# Patient Record
Sex: Female | Born: 1940 | Race: White | Hispanic: No | Marital: Married | Smoking: Former smoker
Health system: Southern US, Community
[De-identification: ages and names within clinical notes are randomized; demographics above are authoritative.]

## PROBLEM LIST (undated history)

## (undated) DIAGNOSIS — I10 Essential (primary) hypertension: Secondary | ICD-10-CM

## (undated) DIAGNOSIS — E785 Hyperlipidemia, unspecified: Secondary | ICD-10-CM

## (undated) DIAGNOSIS — R002 Palpitations: Secondary | ICD-10-CM

## (undated) DIAGNOSIS — E119 Type 2 diabetes mellitus without complications: Secondary | ICD-10-CM

## (undated) DIAGNOSIS — E669 Obesity, unspecified: Secondary | ICD-10-CM

## (undated) DIAGNOSIS — C3490 Malignant neoplasm of unspecified part of unspecified bronchus or lung: Secondary | ICD-10-CM

## (undated) DIAGNOSIS — E039 Hypothyroidism, unspecified: Secondary | ICD-10-CM

## (undated) DIAGNOSIS — C569 Malignant neoplasm of unspecified ovary: Secondary | ICD-10-CM

## (undated) DIAGNOSIS — G4730 Sleep apnea, unspecified: Secondary | ICD-10-CM

## (undated) DIAGNOSIS — F419 Anxiety disorder, unspecified: Secondary | ICD-10-CM

## (undated) HISTORY — DX: Hyperlipidemia, unspecified: E78.5

## (undated) HISTORY — DX: Sleep apnea, unspecified: G47.30

## (undated) HISTORY — DX: Hypothyroidism, unspecified: E03.9

## (undated) HISTORY — DX: Type 2 diabetes mellitus without complications: E11.9

## (undated) HISTORY — DX: Anxiety disorder, unspecified: F41.9

## (undated) HISTORY — DX: Obesity, unspecified: E66.9

## (undated) HISTORY — DX: Malignant neoplasm of unspecified part of unspecified bronchus or lung: C34.90

## (undated) HISTORY — DX: Palpitations: R00.2

## (undated) HISTORY — PX: TONSILLECTOMY: SUR1361

## (undated) HISTORY — DX: Malignant neoplasm of unspecified ovary: C56.9

## (undated) HISTORY — DX: Essential (primary) hypertension: I10

---

## 1998-03-14 DIAGNOSIS — C3490 Malignant neoplasm of unspecified part of unspecified bronchus or lung: Secondary | ICD-10-CM

## 1998-03-14 HISTORY — PX: LOBECTOMY: SHX5089

## 1998-03-14 HISTORY — DX: Malignant neoplasm of unspecified part of unspecified bronchus or lung: C34.90

## 1998-10-05 ENCOUNTER — Encounter: Payer: Self-pay | Admitting: Thoracic Surgery

## 1998-10-06 ENCOUNTER — Inpatient Hospital Stay (HOSPITAL_COMMUNITY): Admission: RE | Admit: 1998-10-06 | Discharge: 1998-10-10 | Payer: Self-pay | Admitting: Thoracic Surgery

## 1998-10-06 ENCOUNTER — Encounter: Payer: Self-pay | Admitting: Thoracic Surgery

## 1998-10-07 ENCOUNTER — Encounter: Payer: Self-pay | Admitting: Thoracic Surgery

## 1998-10-08 ENCOUNTER — Encounter: Payer: Self-pay | Admitting: Thoracic Surgery

## 1998-10-09 ENCOUNTER — Encounter: Payer: Self-pay | Admitting: Thoracic Surgery

## 1999-02-17 ENCOUNTER — Encounter: Payer: Self-pay | Admitting: Thoracic Surgery

## 1999-02-17 ENCOUNTER — Encounter: Admission: RE | Admit: 1999-02-17 | Discharge: 1999-02-17 | Payer: Self-pay | Admitting: Thoracic Surgery

## 1999-03-15 HISTORY — PX: OOPHORECTOMY: SHX86

## 1999-03-15 HISTORY — PX: ABDOMINAL HYSTERECTOMY: SHX81

## 1999-05-11 ENCOUNTER — Inpatient Hospital Stay (HOSPITAL_COMMUNITY): Admission: RE | Admit: 1999-05-11 | Discharge: 1999-05-14 | Payer: Self-pay | Admitting: Obstetrics and Gynecology

## 1999-05-11 ENCOUNTER — Encounter (INDEPENDENT_AMBULATORY_CARE_PROVIDER_SITE_OTHER): Payer: Self-pay

## 1999-08-18 ENCOUNTER — Encounter: Payer: Self-pay | Admitting: Thoracic Surgery

## 1999-08-18 ENCOUNTER — Encounter: Admission: RE | Admit: 1999-08-18 | Discharge: 1999-08-18 | Payer: Self-pay | Admitting: Thoracic Surgery

## 2001-05-25 ENCOUNTER — Ambulatory Visit (HOSPITAL_COMMUNITY): Admission: RE | Admit: 2001-05-25 | Discharge: 2001-05-25 | Payer: Self-pay | Admitting: Gastroenterology

## 2007-08-20 HISTORY — PX: NM MYOCAR PERF WALL MOTION: HXRAD629

## 2009-04-02 ENCOUNTER — Ambulatory Visit (HOSPITAL_COMMUNITY): Admission: RE | Admit: 2009-04-02 | Discharge: 2009-04-02 | Payer: Self-pay | Admitting: Internal Medicine

## 2009-07-28 ENCOUNTER — Other Ambulatory Visit: Admission: RE | Admit: 2009-07-28 | Discharge: 2009-07-28 | Payer: Self-pay | Admitting: Radiology

## 2009-12-30 ENCOUNTER — Ambulatory Visit: Admission: RE | Admit: 2009-12-30 | Discharge: 2009-12-30 | Payer: Self-pay | Admitting: General Surgery

## 2010-05-26 LAB — SURGICAL PCR SCREEN
MRSA, PCR: NEGATIVE
Staphylococcus aureus: NEGATIVE

## 2010-05-26 LAB — DIFFERENTIAL
Basophils Absolute: 0.1 K/uL (ref 0.0–0.1)
Basophils Relative: 0 % (ref 0–1)
Eosinophils Absolute: 0.2 K/uL (ref 0.0–0.7)
Eosinophils Relative: 1 % (ref 0–5)
Lymphocytes Relative: 27 % (ref 12–46)
Lymphs Abs: 3.3 K/uL (ref 0.7–4.0)
Monocytes Absolute: 0.9 K/uL (ref 0.1–1.0)
Monocytes Relative: 7 % (ref 3–12)
Neutro Abs: 7.8 K/uL — ABNORMAL HIGH (ref 1.7–7.7)
Neutrophils Relative %: 64 % (ref 43–77)

## 2010-05-26 LAB — CBC
HCT: 45.9 % (ref 36.0–46.0)
Hemoglobin: 15.3 g/dL — ABNORMAL HIGH (ref 12.0–15.0)
MCH: 31.2 pg (ref 26.0–34.0)
MCHC: 33.3 g/dL (ref 30.0–36.0)
MCV: 93.5 fL (ref 78.0–100.0)
Platelets: 250 K/uL (ref 150–400)
RBC: 4.91 MIL/uL (ref 3.87–5.11)
RDW: 13.4 % (ref 11.5–15.5)
WBC: 12.3 K/uL — ABNORMAL HIGH (ref 4.0–10.5)

## 2010-05-26 LAB — BASIC METABOLIC PANEL
BUN: 8 mg/dL (ref 6–23)
CO2: 30 mEq/L (ref 19–32)
Calcium: 9.2 mg/dL (ref 8.4–10.5)
Chloride: 107 mEq/L (ref 96–112)
Creatinine, Ser: 0.66 mg/dL (ref 0.4–1.2)
GFR calc Af Amer: >60 mL/min (ref >60)
GFR calc non Af Amer: >60 mL/min (ref >60)
Glucose, Bld: 77 mg/dL (ref 70–99)
Potassium: 4.6 mEq/L (ref 3.5–5.1)
Sodium: 142 mEq/L (ref 135–145)

## 2010-07-30 NOTE — Op Note (Signed)
[facility]  Patient:    Erin Myers, Erin Myers Visit Number: [id number] MRN: [id number]          Service Type: Attending:  Petra Kuba, M.D. Dictated by:   Petra Kuba, M.D. Proc. Date: 05/25/01   CC:         Vania Rea. Jarold Motto, M.D. [facility]   Operative Report  PROCEDURE:  Colonoscopy with biopsy.  ENDOSCOPIST:  Petra Kuba, M.D.  INDICATIONS:  Patient with mild GI symptoms.  Due for colonic screening.  INFORMED CONSENT:  Consent was signed after risks, benefits, methods and options were thoroughly discussed in the office.  MEDICATIONS USED:  Demerol 100 mg, Versed 8 mg.  DESCRIPTION OF PROCEDURE:  Rectal inspection was pertinent for external hemorrhoids.  Digital exam was negative.  The video pediatric adjustable colonoscope was inserted and advanced advanced around the colon to the cecum.  This required some abdominal pressure but no position changes.  The cecum was identified by the appendiceal orifice and the ileocecal valve.  In fact, the scope was inserted a short way into the terminal ileum which was normal.  Photodocumentation was obtained.  The scope was slowly withdrawn.  The prep was adequate.  There was some liquid stool that required washing and suctioning.  On slow withdrawal back to the rectum, no abnormalities were seen.  Specifically, no polyps tumors, masses or diverticula.  Once back in the rectum, the scope was retroflexed pertinent for some internal hemorrhoids.  The scope was straightened and readvanced a short way around the left side of the colon.  Air was suctioned.  The scope was removed.  The patient tolerated the procedure well.  There was no obvious immediate complication.  ENDOSCOPIC DIAGNOSES: 1. Internal and external hemorrhoids. 2. Otherwise within normal limits to the terminal ileum.  PLAN: 1. She will follow up p.r.n. 2. Yearly rectal and guaiacs per Dr. Jarold Motto. 3. Repeat screening in 5-10 years. Dictated  by:   Petra Kuba, M.D. Attending:  Petra Kuba, M.D. DD:  05/25/01 TD:  05/26/01 Job: [id number]

## 2011-03-30 ENCOUNTER — Other Ambulatory Visit: Payer: Self-pay

## 2012-08-14 ENCOUNTER — Ambulatory Visit: Payer: Medicare Other | Admitting: Cardiology

## 2012-08-15 ENCOUNTER — Telehealth: Payer: Self-pay | Admitting: Cardiology

## 2012-08-17 ENCOUNTER — Encounter: Payer: Self-pay | Admitting: Cardiology

## 2012-08-17 DIAGNOSIS — G4733 Obstructive sleep apnea (adult) (pediatric): Secondary | ICD-10-CM | POA: Insufficient documentation

## 2012-08-17 DIAGNOSIS — G4730 Sleep apnea, unspecified: Secondary | ICD-10-CM

## 2012-08-17 DIAGNOSIS — E785 Hyperlipidemia, unspecified: Secondary | ICD-10-CM | POA: Insufficient documentation

## 2012-08-17 DIAGNOSIS — E039 Hypothyroidism, unspecified: Secondary | ICD-10-CM

## 2012-08-17 DIAGNOSIS — Z8249 Family history of ischemic heart disease and other diseases of the circulatory system: Secondary | ICD-10-CM

## 2012-08-17 DIAGNOSIS — R002 Palpitations: Secondary | ICD-10-CM | POA: Insufficient documentation

## 2012-08-17 DIAGNOSIS — I10 Essential (primary) hypertension: Secondary | ICD-10-CM

## 2012-08-20 ENCOUNTER — Ambulatory Visit (INDEPENDENT_AMBULATORY_CARE_PROVIDER_SITE_OTHER): Payer: Medicare Other | Admitting: Cardiology

## 2012-08-20 ENCOUNTER — Encounter: Payer: Self-pay | Admitting: Cardiology

## 2012-08-20 VITALS — BP 140/88 | Ht 65.0 in | Wt 216.7 lb

## 2012-08-20 DIAGNOSIS — E669 Obesity, unspecified: Secondary | ICD-10-CM | POA: Insufficient documentation

## 2012-08-20 DIAGNOSIS — G4730 Sleep apnea, unspecified: Secondary | ICD-10-CM

## 2012-08-20 DIAGNOSIS — E785 Hyperlipidemia, unspecified: Secondary | ICD-10-CM

## 2012-08-20 DIAGNOSIS — R002 Palpitations: Secondary | ICD-10-CM

## 2012-08-20 DIAGNOSIS — Z8249 Family history of ischemic heart disease and other diseases of the circulatory system: Secondary | ICD-10-CM

## 2012-08-20 DIAGNOSIS — I10 Essential (primary) hypertension: Secondary | ICD-10-CM

## 2012-08-20 NOTE — Assessment & Plan Note (Signed)
On aspirin, beta blocker and fenofibrate.

## 2012-08-20 NOTE — Assessment & Plan Note (Addendum)
On beta blocker.  Will adjust dosing to 50 mg BID for more AM coverage. These are most likely benign, with no arrhythmia.

## 2012-08-20 NOTE — Assessment & Plan Note (Signed)
No longer using CPAP.  She is using breathe right strips.

## 2012-08-20 NOTE — Assessment & Plan Note (Signed)
On fenofibrate. Need to check recent labs.

## 2012-08-20 NOTE — Assessment & Plan Note (Addendum)
Only on Metoprolol XL. Borderline control.  Stopped ACE-I due to cough, if BP continues to run borderline or hypertensive, would consider ARB.

## 2012-08-20 NOTE — Patient Instructions (Addendum)
You seem to be doing pretty well.  Since your palpitations are worst in the AM -- lets change your Metoprolol to 50mg  (1 tab) 2 times daily to give more overnight & early AM coverage.  Otherwise, I agree with being back on Crestor.  As we discussed, I would like for you to start up an exercise regimen -- start ~15-20 min a day and increase up to 25-30 min a day at least 4 days a week.    If your shortness of breath does not improve, please let us know, so we can further evaluate this.  Marykay Lex, MD

## 2012-08-20 NOTE — Progress Notes (Signed)
Patient ID: Erin Myers, female   DOB: 01-25-1941, 72 y.o.   MRN: [id number]  Clinic Note: HPI: [name] is a 72 y.o. female with a PMH below who presents today for a routine followup of her palpitations.[name] She's been a long-term patient of Dr. Julieanne Manson. She notes her palpitations are seen in the morning with isolated skipped beats. They're not affected by stress, sleep deprivation or caffeine. She does not note them usually throughout the rest of the day. She has not noted a tachycardia. She's been relatively well-controlled on beta blockers. Her exercise regimen was limited last visit by a recent bout of plantar fasciitis -- that still hinders her some. She had been switched to Fenofibrate for lipid control - but was recently converted back to Crestor by her PCP - and is tolerating it with no complaints of myalgias.  Interval History: Since her last visit, she continues to have her AM palpitations that last several minutes -- not sustained.  They usually do not last into the day, and there is no real association with any particular activity or foods.  She does not notice them as much once she is up and about.  These episodes are not associated with dyspnea or chest pain.  She denies any lightheadedness, dizziness or syncopal / near syncopal symptoms associated with the palpitations or not.  She does note some incremental improvement of symptoms since her BB dose was increased.  She denies any PND, orthopnea or edema besides some mild "puffiness" in her legs after a long day on her feet. No complaints of varicose veins or Venous insufficiency symptoms. She denies any melena, hematochezia or hematuria.  She denies any claudication.  Past Medical History  Diagnosis Date  . HTN (hypertension)   . Dyslipidemia   . Hypothyroid   . Palpitations 2009 and 2006    Negative nuclear stress test   . Lung cancer 2000    Carcinoid, s/p RLL lobectomy  . Sleep apnea     on C-pap      show some mild chronic low back pain  Plantar fasciitis of the left diagnosed in summer 2013.  Prior cardiac evaluation and past surgical history: Past Surgical History  Procedure Laterality Date  . Oophorectomy  2001  . Abdominal hysterectomy  2001  . Lobectomy  2000    RLL    Allergies  Allergen Reactions  . Adhesive (Tape)   . Codeine   . Sulfa Antibiotics     Current Outpatient Prescriptions  Medication Sig Dispense Refill  . aspirin 81 MG tablet Take 81 mg by mouth daily.      . clorazepate (TRANXENE) 7.5 MG tablet Take 7.5 mg by mouth 2 (two) times daily as needed for anxiety.      [name] estradiol (ESTRACE) 1 MG tablet Take 1 mg by mouth daily.      [name] levothyroxine (SYNTHROID, LEVOTHROID) 125 MCG tablet Take 125 mcg by mouth daily before breakfast.      . metoprolol succinate (TOPROL-XL) 50 MG 24 hr tablet Take 75 mg by mouth daily. Take 75 mg (1 and1/2 tablets) in morning and 25 mg (1/2 tablet) in evening      . rosuvastatin (CRESTOR) 10 MG tablet Take 10 mg by mouth daily.       No current facility-administered medications for this visit.    History   Social History  . Marital Status: Married    Spouse Name: N/A    Number of Children:  2  . Years of Education: N/A   Occupational History  . Not on file.   Social History Main Topics  . Smoking status: Former Smoker    Quit date: 08/18/1991  . Smokeless tobacco: Not on file  . Alcohol Use: No  . Drug Use: Not on file  . Sexually Active: Not on file   Other Topics Concern  . Not on file   Social History Narrative   Had been unable to exercise due to her plantar fasciitis.    ROS: A comprehensive Review of Systems - Negative except pertinent positives noted above.    PHYSICAL EXAM BP 140/88  Ht 5\' 5"  (1.651 m)  Wt 216 lb 11.2 oz (98.294 kg)  BMI 36.06 kg/m2 General appearance: alert, cooperative, appears stated age, no distress, moderately obese and Normal mood & Affect. Well groomed, healthy  appearing Neck: no adenopathy, no carotid bruit, no JVD, supple, symmetrical, trachea midline and thyroid not enlarged, symmetric, no tenderness/mass/nodules Lungs: clear to auscultation bilaterally, normal percussion bilaterally and non-labored  Heart: regular rate and rhythm, S1, S2 normal, no murmur, click, rub or gallop and normal apical impulse Abdomen: soft, non-tender; bowel sounds normal; no masses,  no organomegaly and moderate truncal obesity Extremities: extremities normal, atraumatic, no cyanosis or edema, no edema, redness or tenderness in the calves or thighs and no ulcers, gangrene or trophic changes Pulses: 2+ and symmetric Neurologic: Grossly normal HEENT: [place]/AT, EOMI, MMM, anicteric sclerae  [id number] today: Yes Rate:68 , Rhythm: NSR, normal ECG  Last Labs: none available  ASSESSMENT: Relatively stable, likely benign palpitations.   Palpitations - Plan: EKG 12-Lead  Dyslipidemia  HTN (hypertension)  Family history of coronary artery disease  Sleep apnea  Obesity (BMI 30-39.9)  PLAN: Per problem list.   Followup: 1 yr  HARDING,DAVID W, M.D., M.S. [facility] [address] [place], [place]  [address]  [phone] Pager # [phone] 08/21/2012 records and 6:29 AM

## 2012-08-20 NOTE — Assessment & Plan Note (Signed)
We discussed dietary modifications & the need to start up an exercise regimen.

## 2012-08-21 ENCOUNTER — Encounter: Payer: Self-pay | Admitting: Cardiology

## 2012-08-23 ENCOUNTER — Encounter: Payer: Self-pay | Admitting: *Deleted

## 2012-08-25 ENCOUNTER — Encounter: Payer: Self-pay | Admitting: Cardiology

## 2013-07-23 NOTE — Telephone Encounter (Signed)
Close encounter 

## 2013-11-20 ENCOUNTER — Ambulatory Visit (INDEPENDENT_AMBULATORY_CARE_PROVIDER_SITE_OTHER): Payer: Medicare Other | Admitting: Cardiology

## 2013-11-20 ENCOUNTER — Encounter: Payer: Self-pay | Admitting: Cardiology

## 2013-11-20 VITALS — BP 142/80 | HR 65 | Ht 65.0 in | Wt 212.4 lb

## 2013-11-20 DIAGNOSIS — E785 Hyperlipidemia, unspecified: Secondary | ICD-10-CM

## 2013-11-20 DIAGNOSIS — I10 Essential (primary) hypertension: Secondary | ICD-10-CM

## 2013-11-20 DIAGNOSIS — Z8249 Family history of ischemic heart disease and other diseases of the circulatory system: Secondary | ICD-10-CM

## 2013-11-20 DIAGNOSIS — E669 Obesity, unspecified: Secondary | ICD-10-CM

## 2013-11-20 DIAGNOSIS — G4733 Obstructive sleep apnea (adult) (pediatric): Secondary | ICD-10-CM

## 2013-11-20 DIAGNOSIS — Z9989 Dependence on other enabling machines and devices: Secondary | ICD-10-CM

## 2013-11-20 DIAGNOSIS — R002 Palpitations: Secondary | ICD-10-CM

## 2013-11-20 NOTE — Patient Instructions (Signed)
NO CHANGES TO CURRENT MEDIATIONS.  Your physician wants you to follow-up in 12 month Dr Ellyn Hack.  You will receive a reminder letter in the mail two months in advance. If you don't receive a letter, please call our office to schedule the follow-up appointment.

## 2013-11-23 ENCOUNTER — Encounter: Payer: Self-pay | Admitting: Cardiology

## 2013-11-23 NOTE — Assessment & Plan Note (Signed)
Stable on current dose of beta blocker. The increased dose to 50 mg of Toprol and taking in the morning has been helpful but it seemed that her thyroid management was more important.

## 2013-11-23 NOTE — Assessment & Plan Note (Signed)
Again borderline control but stable. ARB/HCTZ was added since last visit.

## 2013-11-23 NOTE — Progress Notes (Signed)
PCP: Donnajean Lopes, MD  Clinic Note: Chief Complaint  Patient presents with  . Follow-up    1 year visit. pt denies chest pain and swelling. experiences sob with exertion.    HPI: Erin Myers is a 73 y.o. female with a Cardiovascular Problem List below who presents today for annual followup of palpitations and cardiac risk factors. Her husband Erin Myers is also patient of mine. They were both long-term patient of Dr. Aldona Bar.  Interval History: She is doing relatively well with no major complaints. She does get little short of breath when she tries to either herself a lot but for the most part has been doing pretty well. She was having a little bit of edema in her PCP put her on a low dose of ACTZ combination with her losartan and that seemed to handle it quite well. It also helped her dyspnea. She denies any PND or orthopnea. No exertional chest tightness or pressure. Her palpitations are very fleetingly once twice a week and lasts a few seconds. They seem to be much better ever since her thyroid management has become more stable.  She denies any syncope or near syncope, TIA or amaurosis fugax symptoms.  She and her husband have started on the diet and exercise per gram. She is a little bit upset that his only lost about 4 or 5 pounds and he has lost 10, but continues to be motivated to lose more.  Past Medical History  Diagnosis Date  . HTN (hypertension)   . Dyslipidemia   . Hypothyroid   . Palpitations 2009 and 2006    Negative nuclear stress test   . Lung cancer 2000    Carcinoid, s/p RLL lobectomy  . Sleep apnea     on C-pap   Prior Cardiac Evaluation and Past Surgical History: Past Surgical History  Procedure Laterality Date  . Oophorectomy  2001  . Abdominal hysterectomy  2001  . Lobectomy  2000    RLL  . Nm myocar perf wall motion  08/20/2007    EF 74%  EXERCISE CAPACITY 7 METS   MEDICATIONS AND ALLERGIES REVIEWED IN EPIC No Change in Social and Family  History  ROS: A comprehensive Review of Systems - was performed Review of Systems  Constitutional: Positive for weight loss.       On purpose  HENT: Negative for nosebleeds.   Respiratory: Negative for cough, hemoptysis, sputum production, shortness of breath and wheezing.   Gastrointestinal: Negative for blood in stool and melena.  Genitourinary: Negative for hematuria.  Neurological: Negative for dizziness, sensory change, speech change, focal weakness and loss of consciousness.  Psychiatric/Behavioral: Negative for depression. The patient is not nervous/anxious.   All other systems reviewed and are negative.   Wt Readings from Last 3 Encounters:  11/20/13 212 lb 6.4 oz (96.344 kg)  08/20/12 216 lb 11.2 oz (98.294 kg)   PHYSICAL EXAM BP 142/80  Pulse 65  Ht 5\' 5"  (1.651 m)  Wt 212 lb 6.4 oz (96.344 kg)  BMI 35.35 kg/m2 General appearance: alert, cooperative, appears stated age, no distress, moderately obese and Normal mood & Affect. Well groomed, healthy appearing  Neck: no adenopathy, no carotid bruit, no JVD, supple, symmetrical, trachea midline and thyroid not enlarged, symmetric, no tenderness/mass/nodules  Lungs: clear to auscultation bilaterally, normal percussion bilaterally and non-labored  Heart: regular rate and rhythm, S1, S2 normal, no murmur, click, rub or gallop and normal apical impulse  Abdomen: soft, non-tender; bowel sounds normal; no  masses, no organomegaly and moderate truncal obesity  Extremities: extremities normal, atraumatic, no cyanosis or edema, no edema, redness or tenderness in the calves or thighs and no ulcers, gangrene or trophic changes  Pulses: 2+ and symmetric  Neurologic: Grossly normal  HEENT: [place]/AT, EOMI, MMM, anicteric sclerae   Adult ECG Report  Rate: 65 ;  Rhythm: normal sinus rhythm, borderline LAE but otherwise normal EKG  Narrative Interpretation: Stable Recent Labs not available:   ASSESSMENT / PLAN: Palpitations Stable on  current dose of beta blocker. The increased dose to 50 mg of Toprol and taking in the morning has been helpful but it seemed that her thyroid management was more important.  Essential hypertension Again borderline control but stable. ARB/HCTZ was added since last visit.  Dyslipidemia, goal LDL below 130 Now on Crestor as opposed to fenofibrate. Monitored by PCP  Obesity (BMI 30-39.9) She is actively now working on diet and exercise. A bit frustrated with lack of significant change, but is happy that she is at least losing some. Congratulated her on her efforts and encouraged her to continue.  OSA on CPAP She is off and on using CPAP versus Breathe Right strips    Orders Placed This Encounter  Procedures  . EKG 12-Lead   new meds entered Meds ordered this encounter  Medications  . losartan-hydrochlorothiazide (HYZAAR) 50-12.5 MG per tablet    Sig:     Followup: 12 months    Aubree Doody W, M.D., M.S. Interventional Cardiologist   Pager # [phone]

## 2013-11-23 NOTE — Assessment & Plan Note (Signed)
She is off and on using CPAP versus Breathe Right strips

## 2013-11-23 NOTE — Assessment & Plan Note (Signed)
Now on Crestor as opposed to fenofibrate. Monitored by PCP

## 2013-11-23 NOTE — Assessment & Plan Note (Signed)
She is actively now working on diet and exercise. A bit frustrated with lack of significant change, but is happy that she is at least losing some. Congratulated her on her efforts and encouraged her to continue.

## 2014-10-23 ENCOUNTER — Ambulatory Visit (INDEPENDENT_AMBULATORY_CARE_PROVIDER_SITE_OTHER): Payer: PPO | Admitting: Neurology

## 2014-10-23 ENCOUNTER — Encounter: Payer: Self-pay | Admitting: Neurology

## 2014-10-23 VITALS — BP 136/78 | HR 68 | Resp 16 | Ht 65.0 in | Wt 209.0 lb

## 2014-10-23 DIAGNOSIS — G4733 Obstructive sleep apnea (adult) (pediatric): Secondary | ICD-10-CM

## 2014-10-23 DIAGNOSIS — E669 Obesity, unspecified: Secondary | ICD-10-CM | POA: Diagnosis not present

## 2014-10-23 NOTE — Progress Notes (Signed)
Subjective:    Patient ID: Erin Myers is a 74 y.o. female.  HPI     Star Age, MD, PhD [facility] [address], [address] [place], [place] [address]  Dear Dr. Philip Aspen,  I saw your patient, Erin Myers, upon your kind request in my neurologic clinic today for initial consultation of her sleep disorder, in particular, concern for underlying obstructive sleep apnea and particularly reevaluation thereof. The patient is unaccompanied today. As you know, [name] is a 74 year old right-handed woman with an underlying medical history of palpitations, hypertension, dyslipidemia, hypothyroidism, carcinoid lung cancer, status post right lower lobe lobectomy, who was previously diagnosed with obstructive sleep apnea several years ago and placed on CPAP therapy. Prior sleep test results are not available for my review today. I reviewed your office note from 07/24/2014, which you kindly included. This mentions a prior diagnosis of severe obstructive sleep apnea and CPAP therapy at a pressure of 7 cm. She no longer is on treatment, as she had a difficulty time with it. She reports no family history of OSA. She would be willing to try CPAP again if the need arises. She drinks caffeine in the form of iced tea, 1-2 glasses per day, she does not drink sodas or coffee. She rarely drinks alcohol maybe once or twice per year. She quit smoking in 1995. She works with her husband who has an Multimedia programmer. She is a retired Catering manager. Bedtime is around 11 PM and rise time is around 7 AM. She does not set an alarm. She wakes up sometimes adequately rested and sometimes marginally rested. She denies any headaches. Over the course of time she has gained weight. She has no significant nocturia. She feels tired during the day and may follow sleep if sedentary. She watches TV in bed at night. She denies restless leg symptoms. Her husband noticed her snoring and  years ago she had an episode of gasping for air. She is not known to twitch her legs and her sleep. Her Epworth sleepiness score is 9 out of 24 today, her fatigue score is 36 out of 63.  Her Past Medical History Is Significant For: Past Medical History  Diagnosis Date  . HTN (hypertension)   . Dyslipidemia   . Hypothyroid   . Palpitations 2009 and 2006    Negative nuclear stress test   . Sleep apnea     on C-pap  . Obesity   . Diabetes mellitus without complication   . Hypothyroidism   . Hyperlipemia   . Anxiety   . Lung cancer 2000    Carcinoid, s/p RLL lobectomy  . Ovarian cancer     Her Past Surgical History Is Significant For: Past Surgical History  Procedure Laterality Date  . Oophorectomy  2001  . Abdominal hysterectomy  2001  . Lobectomy  2000    RLL  . Nm myocar perf wall motion  08/20/2007    EF 74%  EXERCISE CAPACITY 7 METS    Her Family History Is Significant For: Family History  Problem Relation Age of Onset  . Coronary artery disease Mother 33    died of an MI  . Heart attack Mother   . CVA Brother 12  . CVA Father   . Suicidality Sister     Her Social History Is Significant For: Social History   Social History  . Marital Status: Married    Spouse Name: N/A  . Number of Children: 2  .  Years of Education: HS   Occupational History  . Insurance     Social History Main Topics  . Smoking status: Former Smoker    Quit date: 08/18/1991  . Smokeless tobacco: None  . Alcohol Use: No  . Drug Use: No  . Sexual Activity: Not Asked   Other Topics Concern  . None   Social History Narrative   1-2 glasses of tea a day     Her Allergies Are:  Allergies  Allergen Reactions  . Adhesive [Tape]   . Codeine   . Lactose Intolerance (Gi) Diarrhea  . Sulfa Antibiotics   :   Her Current Medications Are:  Outpatient Encounter Prescriptions as of 10/23/2014  Medication Sig  . aspirin 81 MG tablet Take 81 mg by mouth daily.  . canagliflozin  (INVOKANA) 300 MG TABS tablet Take 300 mg by mouth daily before breakfast.  . clorazepate (TRANXENE) 7.5 MG tablet Take 7.5 mg by mouth 2 (two) times daily as needed for anxiety.  [name] levothyroxine (SYNTHROID, LEVOTHROID) 112 MCG tablet Take 112 mcg by mouth daily before breakfast.  . losartan-hydrochlorothiazide (HYZAAR) 50-12.5 MG per tablet   . metoprolol succinate (TOPROL-XL) 50 MG 24 hr tablet Take 75 mg by mouth daily. Take 75 mg (1 and1/2 tablets) in morning and 25 mg (1/2 tablet) in evening  . rosuvastatin (CRESTOR) 10 MG tablet Take 10 mg by mouth daily.  . [DISCONTINUED] cyclobenzaprine (FLEXERIL) 10 MG tablet Take 10 mg by mouth 3 (three) times daily as needed for muscle spasms.  . [DISCONTINUED] ipratropium-albuterol (DUONEB) 0.5-2.5 (3) MG/3ML SOLN Take 3 mLs by nebulization.   No facility-administered encounter medications on file as of 10/23/2014.  :  Review of Systems:  Out of a complete 14 point review of systems, all are reviewed and negative with the exception of these symptoms as listed below:   Review of Systems  Constitutional: Positive for fatigue.  HENT: Positive for tinnitus.   Respiratory: Positive for cough and wheezing.        Snoring   Cardiovascular: Positive for leg swelling.  Endocrine:       Flushing   Neurological:       Sleep study around 2000, H/O CPAP use, Snoring, no trouble falling asleep or staying asleep, witnessed apnea, falls asleep while sitting still, wakes up in the morning feeling tired, no headaches, denies taking naps.   Psychiatric/Behavioral:       Too much sleep     Objective:  Neurologic Exam  Physical Exam Physical Examination:   Filed Vitals:   10/23/14 1429  BP: 136/78  Pulse: 68  Resp: 16    General Examination: The patient is a very pleasant 75 y.o. female in no acute distress. She appears well-developed and well-nourished and well groomed.   HEENT: Normocephalic, atraumatic, pupils are equal, round and reactive to  light and accommodation. Funduscopic exam is normal with sharp disc margins noted. Extraocular tracking is good without limitation to gaze excursion or nystagmus noted. Normal smooth pursuit is noted. Hearing is grossly intact. Tympanic membranes are clear bilaterally. Face is symmetric with normal facial animation and normal facial sensation. Speech is clear with no dysarthria noted. There is no hypophonia. There is no lip, neck/head, jaw or voice tremor. Neck is supple with full range of passive and active motion. There are no carotid bruits on auscultation. Oropharynx exam reveals: mild mouth dryness, adequate dental hygiene and moderate airway crowding, due to narrow airway entry and redundant soft palate. Tonsils are  absent. Mallampati is class III. Tongue protrudes centrally and palate elevates symmetrically. Neck size is 15.5 inches. She has a Mild overbite. Nasal inspection reveals no significant nasal mucosal bogginess or redness and no septal deviation.   Chest: Clear to auscultation without wheezing, rhonchi or crackles noted.  Heart: S1+S2+0, regular and normal without murmurs, rubs or gallops noted.   Abdomen: Soft, non-tender and non-distended with normal bowel sounds appreciated on auscultation.  Extremities: There is no pitting edema in the distal lower extremities bilaterally. Pedal pulses are intact.  Skin: Warm and dry without trophic changes noted. There are no varicose veins.  Musculoskeletal: exam reveals no obvious joint deformities, tenderness or joint swelling or erythema.   Neurologically:  Mental status: The patient is awake, alert and oriented in all 4 spheres. Her immediate and remote memory, attention, language skills and fund of knowledge are appropriate. There is no evidence of aphasia, agnosia, apraxia or anomia. Speech is clear with normal prosody and enunciation. Thought process is linear. Mood is normal and affect is normal.  Cranial nerves II - XII are as  described above under HEENT exam. In addition: shoulder shrug is normal with equal shoulder height noted. Motor exam: Normal bulk, strength and tone is noted. There is no drift, tremor or rebound. Romberg is negative. Reflexes are 2+ throughout. Babinski: Toes are flexor bilaterally. Fine motor skills and coordination: intact with normal finger taps, normal hand movements, normal rapid alternating patting, normal foot taps and normal foot agility.  Cerebellar testing: No dysmetria or intention tremor on finger to nose testing. Heel to shin is difficult for her bilaterally. There is no truncal or gait ataxia.  Sensory exam: intact to light touch, pinprick, vibration, temperature sense in the upper and lower extremities.  Gait, station and balance: She stands easily. No veering to one side is noted. No leaning to one side is noted. Posture is age-appropriate and stance is narrow based. Gait shows normal stride length and normal pace. No problems turning are noted. She turns en bloc. Tandem walk is slightly difficult initially.  Assessment and Plan:   In summary, Tamyrah Burbage is a very pleasant 74 y.o.-year old female with an underlying medical history of palpitations, hypertension, dyslipidemia, hypothyroidism, carcinoid lung cancer, status post right lower lobe lobectomy, who was previously diagnosed with obstructive sleep apnea several years ago and placed on CPAP therapy. She presents for re-evaluation. Her history and physical exam are in keeping with obstructive sleep apnea (OSA). I had a long chat with the patient about my findings and the diagnosis of OSA, its prognosis and treatment options. We talked about medical treatments, surgical interventions and non-pharmacological approaches. I explained in particular the risks and ramifications of untreated moderate to severe OSA, especially with respect to developing cardiovascular disease down the Road, including congestive heart failure, difficult  to treat hypertension, cardiac arrhythmias, or stroke. Even type 2 diabetes has, in part, been linked to untreated OSA. Symptoms of untreated OSA include daytime sleepiness, memory problems, mood irritability and mood disorder such as depression and anxiety, lack of energy, as well as recurrent headaches, especially morning headaches. We talked about trying to maintain a healthy lifestyle in general, as well as the importance of weight control. I encouraged the patient to eat healthy, exercise daily and keep well hydrated, to keep a scheduled bedtime and wake time routine, to not skip any meals and eat healthy snacks in between meals. I advised the patient not to drive when feeling sleepy. I recommended  the following at this time: sleep study with potential positive airway pressure titration. (We will score hypopneas at 4% and split the sleep study into diagnostic and treatment portion, if the estimated. 2 hour AHI is >15/h).   I explained the sleep test procedure to the patient and also outlined possible surgical and non-surgical treatment options of OSA, including the use of a custom-made dental device (which would require a referral to a specialist dentist or oral surgeon), upper airway surgical options, such as pillar implants, radiofrequency surgery, tongue base surgery, and UPPP (which would involve a referral to an ENT surgeon). Rarely, jaw surgery such as mandibular advancement may be considered.  I also explained the CPAP treatment option to the patient, who indicated that she would be willing to try CPAP again if the need arises. I explained the importance of being compliant with PAP treatment, not only for insurance purposes but primarily to improve Her symptoms, and for the patient's long term health benefit, including to reduce Her cardiovascular risks. I answered all her questions today and the patient was in agreement. I would like to see her back after the sleep study is completed and encouraged  her to call with any interim questions, concerns, problems or updates.   Thank you very much for allowing me to participate in the care of this nice patient. If I can be of any further assistance to you please do not hesitate to call me at [phone].  Sincerely,   Star Age, MD, PhD

## 2014-10-23 NOTE — Patient Instructions (Signed)
Based on your symptoms and your exam I believe you are still at risk for obstructive sleep apnea or OSA, and I think we should proceed with a sleep study to determine whether you do or do not have OSA and how severe it is. If you have more than mild OSA, I want you to consider treatment with CPAP. Please remember, the risks and ramifications of moderate to severe obstructive sleep apnea or OSA are: Cardiovascular disease, including congestive heart failure, stroke, difficult to control hypertension, arrhythmias, and even type 2 diabetes has been linked to untreated OSA. Sleep apnea causes disruption of sleep and sleep deprivation in most cases, which, in turn, can cause recurrent headaches, problems with memory, mood, concentration, focus, and vigilance. Most people with untreated sleep apnea report excessive daytime sleepiness, which can affect their ability to drive. Please do not drive if you feel sleepy.   I will likely see you back after your sleep study to go over the test results and where to go from there. We will call you after your sleep study to advise about the results (most likely, you will hear from Diana, my nurse) and to set up an appointment at the time, as necessary.    Our sleep lab administrative assistant, Dawn will meet with you or call you to schedule your sleep study. If you don't hear back from her by next week please feel free to call her at [phone]. This is her direct line and please leave a message with your phone number to call back if you get the voicemail box. She will call back as soon as possible.   

## 2014-12-15 ENCOUNTER — Ambulatory Visit (INDEPENDENT_AMBULATORY_CARE_PROVIDER_SITE_OTHER): Payer: PPO | Admitting: Neurology

## 2014-12-15 DIAGNOSIS — G4733 Obstructive sleep apnea (adult) (pediatric): Secondary | ICD-10-CM

## 2014-12-15 DIAGNOSIS — G4734 Idiopathic sleep related nonobstructive alveolar hypoventilation: Secondary | ICD-10-CM

## 2014-12-15 DIAGNOSIS — G4720 Circadian rhythm sleep disorder, unspecified type: Secondary | ICD-10-CM

## 2014-12-15 NOTE — Sleep Study (Signed)
Please see the scanned sleep study interpretation located in the procedure tab in the chart view section.  

## 2014-12-19 ENCOUNTER — Telehealth: Payer: Self-pay | Admitting: Neurology

## 2014-12-19 DIAGNOSIS — G4734 Idiopathic sleep related nonobstructive alveolar hypoventilation: Secondary | ICD-10-CM

## 2014-12-19 DIAGNOSIS — G4733 Obstructive sleep apnea (adult) (pediatric): Secondary | ICD-10-CM

## 2014-12-19 NOTE — Telephone Encounter (Signed)
Patient referred by Dr. Philip Aspen, seen by me on 10/23/14, diagnostic PSG on 12/15/14, ins: Medicare ([employer]).   Please call and notify the patient that the recent sleep study did confirm the diagnosis of moderate obstructive sleep apnea with significant desaturations in REM sleep, and that I recommend treatment for this in the form of CPAP. This will require a repeat sleep study for proper titration and mask fitting. Please explain to patient and arrange for a CPAP titration study. I have placed an order in the chart. Thanks, and please route to [facility] for scheduling next sleep study.  Star Age, MD, PhD [facility])

## 2014-12-22 ENCOUNTER — Telehealth: Payer: Self-pay

## 2014-12-22 NOTE — Telephone Encounter (Signed)
I spoke to patient and she is aware of results. She would like to proceed with titration study. I will fax report to PCP.

## 2014-12-22 NOTE — Telephone Encounter (Signed)
Patient referred by Dr. Philip Aspen, seen by me on 10/23/14, diagnostic PSG on 12/15/14, ins: Medicare ([employer]).  Please call and notify the patient that the recent sleep study did confirm the diagnosis of moderate obstructive sleep apnea with significant desaturations in REM sleep, and that I recommend treatment for this in the form of CPAP. This will require a repeat sleep study for proper titration and mask fitting. Please explain to patient and arrange for a CPAP titration study. I have placed an order in the chart. Thanks, and please route to [facility] for scheduling next sleep study.

## 2014-12-22 NOTE — Telephone Encounter (Signed)
Duplicate phone note, please see other.

## 2015-01-18 ENCOUNTER — Ambulatory Visit (INDEPENDENT_AMBULATORY_CARE_PROVIDER_SITE_OTHER): Payer: PPO | Admitting: Neurology

## 2015-01-18 DIAGNOSIS — G4720 Circadian rhythm sleep disorder, unspecified type: Secondary | ICD-10-CM

## 2015-01-18 DIAGNOSIS — G4733 Obstructive sleep apnea (adult) (pediatric): Secondary | ICD-10-CM | POA: Diagnosis not present

## 2015-01-18 DIAGNOSIS — G479 Sleep disorder, unspecified: Secondary | ICD-10-CM

## 2015-01-18 DIAGNOSIS — G4734 Idiopathic sleep related nonobstructive alveolar hypoventilation: Secondary | ICD-10-CM

## 2015-01-19 NOTE — Sleep Study (Signed)
Please see the scanned sleep study interpretation located in the Procedure tab within the Chart Review section. 

## 2015-01-22 ENCOUNTER — Telehealth: Payer: Self-pay | Admitting: Neurology

## 2015-01-22 DIAGNOSIS — G4733 Obstructive sleep apnea (adult) (pediatric): Secondary | ICD-10-CM

## 2015-01-22 DIAGNOSIS — G4734 Idiopathic sleep related nonobstructive alveolar hypoventilation: Secondary | ICD-10-CM

## 2015-01-22 NOTE — Telephone Encounter (Signed)
Patient referred by Dr. Philip Aspen, seen by me on 10/23/14, diagnostic PSG on 12/15/14, CPAP study on 01/18/15, ins: Medicare ([employer]). Please call and inform patient that I have entered an order for treatment with positive airway pressure (PAP) treatment of obstructive sleep apnea (OSA). She did well during the latest sleep study with CPAP. We will, therefore, arrange for a machine for home use through a DME (durable medical equipment) company of Her choice; and I will see the patient back in follow-up in about 8-10 weeks. Please also explain to the patient that I will be looking out for compliance data, which can be downloaded from the machine (stored on an SD card, that is inserted in the machine) or via remote access through a modem, that is built into the machine. At the time of the followup appointment we will discuss sleep study results and how it is going with PAP treatment at home. Please advise patient to bring Her machine at the time of the first FU visit, even though this is cumbersome. Bringing the machine for every visit after that will likely not be needed, but often helps for the first visit to troubleshoot if needed. Please re-enforce the importance of compliance with treatment and the need for [place] to monitor compliance data - often an insurance requirement and actually good feedback for the patient as far as how they are doing.  Also remind patient, that any interim PAP machine or mask issues should be first addressed with the DME company, as they can often help better with technical and mask fit issues. Please ask if patient has a preference regarding DME company.  I would also like to do an ONO while on RA once she is on CPAP to make sure oxygen levels are good on treatment. Order in chart.  Please also make sure, the patient has a follow-up appointment with me in about 8-10 weeks from the setup date, thanks.  Once you have spoken to the patient - and faxed/routed report to PCP and  referring MD (if other than PCP), you can close this encounter, thanks,   Star Age, MD, PhD [facility] ([place])

## 2015-01-26 NOTE — Telephone Encounter (Signed)
I spoke to patient and she is aware of results and recommendation. SHe is willing to proceed with treatment. I will send orders and ONO order to [facility]. I will fax report to PCP. I will also send patient a letter reminding her to make f/u appt and stress the importance of compliance.

## 2015-03-03 ENCOUNTER — Telehealth: Payer: Self-pay | Admitting: Neurology

## 2015-03-03 DIAGNOSIS — G4734 Idiopathic sleep related nonobstructive alveolar hypoventilation: Secondary | ICD-10-CM

## 2015-03-03 DIAGNOSIS — Z9989 Dependence on other enabling machines and devices: Secondary | ICD-10-CM

## 2015-03-03 DIAGNOSIS — G4733 Obstructive sleep apnea (adult) (pediatric): Secondary | ICD-10-CM

## 2015-03-03 NOTE — Telephone Encounter (Signed)
Mandy/[facility] [phone] called to request order for nighttime O2 with CPAP, patient below 89% for 334 minutes on CPAP, lowest SP O2 73, request order for 2 litres oxygen bled in with CPAP.

## 2015-03-03 NOTE — Telephone Encounter (Signed)
Please process O2 order.

## 2015-03-03 NOTE — Telephone Encounter (Signed)
I have received order and will let Mandy know.

## 2015-03-12 ENCOUNTER — Telehealth: Payer: Self-pay | Admitting: Neurology

## 2015-03-12 NOTE — Telephone Encounter (Signed)
I spoke to patient and she is aware of results and recommendations. She reports that she declined supplemental O2 due to financial reasons. We will contact [employer] to see if they can offer any assistance.

## 2015-03-12 NOTE — Telephone Encounter (Signed)
I reviewed the patient's ONO (overnight pulse oximetry report) from 03/01/2015, while on room air and CPAP. Her baseline oxygen saturation for the night was 89.2% and minimum oxygen saturation was 73%. Time below 88% saturation was 266 minutes. Based on these test results, the patient qualifies for supplemental oxygen along with CPAP therapy. I have placed an order. Please relay to patient.   Star Age, MD, PhD [facility])

## 2015-03-17 ENCOUNTER — Telehealth: Payer: Self-pay

## 2015-03-17 NOTE — Telephone Encounter (Signed)
-----   Message from Berton Mount sent at 03/13/2015  1:14 PM EST ----- We will call the patient today and see what we can work out!! Thanks!  Mandy at [employer] ----- Message -----    From: Laurence Spates, RN    Sent: 03/12/2015   5:13 PM      To: Berton Mount  We spoke to patient and she states that she is declining the oxygen due to financial reasons. Dr. Rexene Alberts wonders if there as any assistance that [facility] can offer her?

## 2015-03-23 DIAGNOSIS — G4733 Obstructive sleep apnea (adult) (pediatric): Secondary | ICD-10-CM | POA: Diagnosis not present

## 2015-03-30 ENCOUNTER — Ambulatory Visit (INDEPENDENT_AMBULATORY_CARE_PROVIDER_SITE_OTHER): Payer: PPO | Admitting: Neurology

## 2015-03-30 ENCOUNTER — Encounter: Payer: Self-pay | Admitting: Neurology

## 2015-03-30 VITALS — BP 118/64 | HR 80 | Resp 16 | Ht 65.0 in | Wt 212.0 lb

## 2015-03-30 DIAGNOSIS — G4734 Idiopathic sleep related nonobstructive alveolar hypoventilation: Secondary | ICD-10-CM | POA: Diagnosis not present

## 2015-03-30 DIAGNOSIS — G4733 Obstructive sleep apnea (adult) (pediatric): Secondary | ICD-10-CM | POA: Diagnosis not present

## 2015-03-30 DIAGNOSIS — C7A090 Malignant carcinoid tumor of the bronchus and lung: Secondary | ICD-10-CM | POA: Diagnosis not present

## 2015-03-30 DIAGNOSIS — E669 Obesity, unspecified: Secondary | ICD-10-CM | POA: Diagnosis not present

## 2015-03-30 DIAGNOSIS — Z9989 Dependence on other enabling machines and devices: Principal | ICD-10-CM

## 2015-03-30 NOTE — Patient Instructions (Addendum)
Please continue using your CPAP regularly. While your insurance requires that you use CPAP at least 4 hours each night on 70% of the nights, I recommend, that you not skip any nights and use it throughout the night if you can. Getting used to CPAP and staying with the treatment long term does take time and patience and discipline. Untreated obstructive sleep apnea when it is moderate to severe can have an adverse impact on cardiovascular health and raise her risk for heart disease, arrhythmias, hypertension, congestive heart failure, stroke and diabetes. Untreated obstructive sleep apnea causes sleep disruption, nonrestorative sleep, and sleep deprivation. This can have an impact on your day to day functioning and cause daytime sleepiness and impairment of cognitive function, memory loss, mood disturbance, and problems focussing. Using CPAP regularly can improve these symptoms. We will request a pulmonology consult, their office will be in touch with you.  Keep up the good work! I will see you back in 3 months for sleep apnea check up.

## 2015-03-30 NOTE — Progress Notes (Signed)
Subjective:    Patient ID: Erin Myers is a 75 y.o. female.  HPI     Interim history:   Erin Myers is a 75 year old right-handed woman with an underlying medical history of palpitations, hypertension, dyslipidemia, hypothyroidism, carcinoid lung cancer, status post right lower lobe lobectomy, who presents for follow-up consultation of her obstructive sleep apnea, after recent sleep studies. The patient is unaccompanied today. I first met her on 10/23/2014 at the request of her primary care physician, at which time she reported a prior diagnosis of OSA and prior CPAP therapy but she had stopped using CPAP. I invited her back for sleep study. She had a baseline sleep study, followed by a CPAP titration study. I went over her test results with her in detail today. Her baseline sleep study from 12/15/2014 showed a sleep efficiency of 77.8% with a sleep latency of 52 minutes and wake after sleep onset of 52.5 minutes with mild sleep fragmentation noted. She had an elevated arousal index. She had an increased percentage of stage II sleep, a mildly decreased percentage of slow-wave sleep and a normal percentage of REM sleep with a normal REM latency. She had no significant PLMS, EKG or EEG changes. Moderate to loud snoring was noted. Total AHI was 25.9 per hour, average oxygen saturation of only 86%, nadir was 71% during REM sleep. Time below 88% saturation was nearly 5 hours.  Based on her sleep test results I invited her back for a full night CPAP titration study. She had this on 01/18/2015. Sleep efficiency was 77.7% with a latency to sleep of 54 minutes and wake after sleep onset of 56 minutes with mild to moderate sleep fragmentation noted. She had a normal arousal index. She had an increased percentage of stage II sleep, absence of slow-wave sleep and a normal percentage of REM sleep with a normal REM latency. She had no significant PLMS, EKG or EEG changes. She had an average oxygen saturation of  89%, nadir was 81%. Time below 88% saturation was 2 hours and 11 minutes. Based on her test results are prescribed CPAP therapy for home use and also ordered a overnight pulse oximetry test once she was established on home CPAP therapy.  I reviewed her home pulse oximetry test results from 03/01/2015 while on CPAP therapy: Average oxygen saturation was 89.2%, nadir was 73%, time below 88% saturation was 266.4 minutes. Based on her test results I prescribed supplemental oxygen with CPAP therapy.  Today, 03/30/2015: I reviewed her CPAP compliance data from 02/25/2015 through 03/26/2015 which is a total of 30 days during which time she used her CPAP every night except for 1, with percent used days greater than 4 hours at 90%, indicating excellent compliance with an average usage of 6 hours and 42 minutes, residual AHI 1.1 per hour, leak low with the 95th percentile at 4.6 L/m and a pressure of 11 cm with EPR of 2.  Today, 03/30/2015: She reports doing well, using CPAP, but not the O2, due to cost and too cumbersome. Had lung cancer, had RLL removed for Carcinoid, no chemo, no radiation. She has allergies, post nasal drip, occasional SOB and occasionally wheezy. Stopped smoking over 25 years ago. Had not had a PFT in years. She is compliant with CPAP treatment. While she does not notice a telltale improvement of her sleep, she does wake up a little better rested.  Previously:  10/23/2014: She was previously diagnosed with obstructive sleep apnea several years ago and placed on CPAP  therapy. Prior sleep test results are not available for my review today. I reviewed your office note from 07/24/2014, which you kindly included. This mentions a prior diagnosis of severe obstructive sleep apnea and CPAP therapy at a pressure of 7 cm. She no longer is on treatment, as she had a difficulty time with it.  She reports no family history of OSA. She would be willing to try CPAP again if the need arises. She drinks  caffeine in the form of iced tea, 1-2 glasses per day, she does not drink sodas or coffee. She rarely drinks alcohol maybe once or twice per year. She quit smoking in 1995. She works with her husband who has an Multimedia programmer. She is a retired Catering manager. Bedtime is around 11 PM and rise time is around 7 AM. She does not set an alarm. She wakes up sometimes adequately rested and sometimes marginally rested. She denies any headaches. Over the course of time she has gained weight. She has no significant nocturia. She feels tired during the day and may follow sleep if sedentary. She watches TV in bed at night. She denies restless leg symptoms. Her husband noticed her snoring and years ago she had an episode of gasping for air. She is not known to twitch her legs and her sleep. Her Epworth sleepiness score is 9 out of 24 today, her fatigue score is 36 out of 63.   Her Past Medical History Is Significant For: Past Medical History  Diagnosis Date  . HTN (hypertension)   . Dyslipidemia   . Hypothyroid   . Palpitations 2009 and 2006    Negative nuclear stress test   . Sleep apnea     on C-pap  . Obesity   . Diabetes mellitus without complication ([place])   . Hypothyroidism   . Hyperlipemia   . Anxiety   . Lung cancer ([place]) 2000    Carcinoid, s/p RLL lobectomy  . Ovarian cancer [facility])     Her Past Surgical History Is Significant For: Past Surgical History  Procedure Laterality Date  . Oophorectomy  2001  . Abdominal hysterectomy  2001  . Lobectomy  2000    RLL  . Nm myocar perf wall motion  08/20/2007    EF 74%  EXERCISE CAPACITY 7 METS    Her Family History Is Significant For: Family History  Problem Relation Age of Onset  . Coronary artery disease Mother 57    died of an MI  . Heart attack Mother   . CVA Brother 61  . CVA Father   . Suicidality Sister     Her Social History Is Significant For: Social History   Social History  . Marital Status: Married    Spouse Name:  N/A  . Number of Children: 2  . Years of Education: HS   Occupational History  . Insurance     Social History Main Topics  . Smoking status: Former Smoker    Quit date: 08/18/1991  . Smokeless tobacco: None  . Alcohol Use: No  . Drug Use: No  . Sexual Activity: Not Asked   Other Topics Concern  . None   Social History Narrative   1-2 glasses of tea a day     Her Allergies Are:  Allergies  Allergen Reactions  . Adhesive [Tape]   . Codeine   . Lactose Intolerance (Gi) Diarrhea  . Sulfa Antibiotics   :   Her Current Medications Are:  Outpatient Encounter Prescriptions as of  03/30/2015  Medication Sig  . aspirin 81 MG tablet Take 81 mg by mouth daily.  . clorazepate (TRANXENE) 7.5 MG tablet Take 7.5 mg by mouth 2 (two) times daily as needed for anxiety.  [name] levothyroxine (SYNTHROID, LEVOTHROID) 112 MCG tablet Take 112 mcg by mouth daily before breakfast.  . losartan-hydrochlorothiazide (HYZAAR) 50-12.5 MG per tablet   . metoprolol succinate (TOPROL-XL) 50 MG 24 hr tablet Take 75 mg by mouth daily. Take 75 mg (1 and1/2 tablets) in morning and 25 mg (1/2 tablet) in evening  . rosuvastatin (CRESTOR) 10 MG tablet Take 10 mg by mouth daily.  . [DISCONTINUED] canagliflozin (INVOKANA) 300 MG TABS tablet Take 300 mg by mouth daily before breakfast.   No facility-administered encounter medications on file as of 03/30/2015.  :  Review of Systems:  Out of a complete 14 point review of systems, all are reviewed and negative with the exception of these symptoms as listed below:   Review of Systems  Neurological:       Patient is here for CPAP f/u. No new concerns or problems.     Objective:  Neurologic Exam  Physical Exam Physical Examination:   Filed Vitals:   03/30/15 1555  BP: 118/64  Pulse: 80  Resp: 16    General Examination: The patient is a very pleasant 75 y.o. female in no acute distress. She appears well-developed and well-nourished and well groomed. She is in  good spirits today.  HEENT: Normocephalic, atraumatic, pupils are equal, round and reactive to light and accommodation. Extraocular tracking is good without limitation to gaze excursion or nystagmus noted. Normal smooth pursuit is noted. Hearing is grossly intact. Face is symmetric with normal facial animation and normal facial sensation. Speech is clear with no dysarthria noted. There is no hypophonia. There is no lip, neck/head, jaw or voice tremor. Neck is supple with full range of passive and active motion. There are no carotid bruits on auscultation. Oropharynx exam reveals: mild mouth dryness, mild erythema, adequate dental hygiene and moderate airway crowding, due to narrow airway entry and redundant soft palate. Tonsils are absent. Mallampati is class III. Tongue protrudes centrally and palate elevates symmetrically.    Chest: Clear to auscultation without wheezing, rhonchi or crackles noted.  Heart: S1+S2+0, regular and normal without murmurs, rubs or gallops noted.   Abdomen: Soft, non-tender and non-distended with normal bowel sounds appreciated on auscultation.  Extremities: There is no pitting edema in the distal lower extremities bilaterally. Pedal pulses are intact.  Skin: Warm and dry without trophic changes noted. There are no varicose veins.  Musculoskeletal: exam reveals no obvious joint deformities, tenderness or joint swelling or erythema.   Neurologically:  Mental status: The patient is awake, alert and oriented in all 4 spheres. Her immediate and remote memory, attention, language skills and fund of knowledge are appropriate. There is no evidence of aphasia, agnosia, apraxia or anomia. Speech is clear with normal prosody and enunciation. Thought process is linear. Mood is normal and affect is normal.  Cranial nerves II - XII are as described above under HEENT exam. In addition: shoulder shrug is normal with equal shoulder height noted. Motor exam: Normal bulk, strength and  tone is noted. There is no drift, tremor or rebound. Romberg is negative. Reflexes are 1-2+ throughout. Fine motor skills and coordination: intact with normal finger taps, normal hand movements, normal rapid alternating patting, normal foot taps and normal foot agility.  Cerebellar testing: No dysmetria or intention tremor on finger to nose  testing. Heel to shin is difficult for her bilaterally. There is no truncal or gait ataxia.  Sensory exam: intact to light touch in the upper and lower extremities.  Gait, station and balance: She stands easily. No veering to one side is noted. No leaning to one side is noted. Posture is age-appropriate and stance is narrow based. Gait shows normal stride length and normal pace. No problems turning are noted. She turns en bloc. Tandem walk is slightly difficult initially, better with the second try.  Assessment and Plan:   In summary, Britnee Mcdevitt is a very pleasant 75 year old female with an underlying medical history of palpitations, hypertension, dyslipidemia, hypothyroidism, carcinoid lung cancer, status post right lower lobectomy, who presents for follow-up consultation of her obstructive sleep apnea after reevaluation. She had a baseline sleep study in October 2016, followed by a CPAP titration study in November 2016. We talked about her test results in detail. She had good results with CPAP at a pressure of 11 cm with significant reduction of her AHI but persistent lower oxygen saturations. We proceeded with a overnight pulse oximetry test while on CPAP therapy at home. She did this in December 2016 with abnormal findings and I prescribed supplemental oxygen therapy for her. She does not wish to use oxygen at this time. She feels slightly improved with CPAP therapy and is able to tolerate this. Given her previous CPAP intolerance, she is doing rather well in that regard. She is commended for being compliant with CPAP therapy. Nevertheless, she may still need  supplemental oxygen. Given her lung cancer diagnosis and her symptoms of occasional wheezing and shortness of breath, I suggested a referral to pulmonology for additional diagnostic help in management help. I placed a referral today. She is in agreement. Her exam is stable.  I explained the importance of being compliant with PAP treatment, not only for insurance purposes but primarily to improve Her symptoms, and for the patient's long term health benefit, including to reduce Her cardiovascular risks. I would like to see her back in 3 months, sooner if needed. I answered all her questions today and the patient was in agreement.  I spent 25 minutes in total face-to-face time with the patient, more than 50% of which was spent in counseling and coordination of care, reviewing test results, reviewing medication and discussing or reviewing the diagnosis of OSA and nocturnal hypoxemia, the prognosis and treatment options.

## 2015-04-22 DIAGNOSIS — G4733 Obstructive sleep apnea (adult) (pediatric): Secondary | ICD-10-CM | POA: Diagnosis not present

## 2015-04-23 DIAGNOSIS — G4733 Obstructive sleep apnea (adult) (pediatric): Secondary | ICD-10-CM | POA: Diagnosis not present

## 2015-05-06 ENCOUNTER — Institutional Professional Consult (permissible substitution): Payer: Self-pay | Admitting: Pulmonary Disease

## 2015-05-14 ENCOUNTER — Ambulatory Visit (INDEPENDENT_AMBULATORY_CARE_PROVIDER_SITE_OTHER): Payer: PPO | Admitting: Pulmonary Disease

## 2015-05-14 ENCOUNTER — Encounter: Payer: Self-pay | Admitting: Pulmonary Disease

## 2015-05-14 ENCOUNTER — Ambulatory Visit (INDEPENDENT_AMBULATORY_CARE_PROVIDER_SITE_OTHER)
Admission: RE | Admit: 2015-05-14 | Discharge: 2015-05-14 | Disposition: A | Discharge status: Home / Self Care | Payer: PPO | Source: Ambulatory Visit | Attending: Pulmonary Disease | Admitting: Pulmonary Disease

## 2015-05-14 VITALS — BP 130/80 | HR 60 | Temp 97.6°F | Ht 65.0 in | Wt 215.2 lb

## 2015-05-14 DIAGNOSIS — E039 Hypothyroidism, unspecified: Secondary | ICD-10-CM

## 2015-05-14 DIAGNOSIS — E668 Other obesity: Secondary | ICD-10-CM | POA: Diagnosis not present

## 2015-05-14 DIAGNOSIS — E038 Other specified hypothyroidism: Secondary | ICD-10-CM | POA: Diagnosis not present

## 2015-05-14 DIAGNOSIS — G4733 Obstructive sleep apnea (adult) (pediatric): Secondary | ICD-10-CM

## 2015-05-14 DIAGNOSIS — R002 Palpitations: Secondary | ICD-10-CM | POA: Diagnosis not present

## 2015-05-14 DIAGNOSIS — Z859 Personal history of malignant neoplasm, unspecified: Secondary | ICD-10-CM | POA: Diagnosis not present

## 2015-05-14 DIAGNOSIS — M25512 Pain in left shoulder: Secondary | ICD-10-CM | POA: Diagnosis not present

## 2015-05-14 DIAGNOSIS — Z9989 Dependence on other enabling machines and devices: Secondary | ICD-10-CM

## 2015-05-14 DIAGNOSIS — R7309 Other abnormal glucose: Secondary | ICD-10-CM | POA: Diagnosis not present

## 2015-05-14 DIAGNOSIS — R0602 Shortness of breath: Secondary | ICD-10-CM | POA: Diagnosis not present

## 2015-05-14 DIAGNOSIS — R0600 Dyspnea, unspecified: Secondary | ICD-10-CM

## 2015-05-14 DIAGNOSIS — I10 Essential (primary) hypertension: Secondary | ICD-10-CM | POA: Diagnosis not present

## 2015-05-14 DIAGNOSIS — Z1389 Encounter for screening for other disorder: Secondary | ICD-10-CM | POA: Diagnosis not present

## 2015-05-14 DIAGNOSIS — Z6836 Body mass index (BMI) 36.0-36.9, adult: Secondary | ICD-10-CM | POA: Diagnosis not present

## 2015-05-14 DIAGNOSIS — Z8659 Personal history of other mental and behavioral disorders: Secondary | ICD-10-CM | POA: Insufficient documentation

## 2015-05-14 DIAGNOSIS — E669 Obesity, unspecified: Secondary | ICD-10-CM

## 2015-05-14 MED ORDER — CLONAZEPAM 0.5 MG PO TABS: 0.5000 mg | ORAL_TABLET | Freq: Two times a day (BID) | ORAL | Status: DC

## 2015-05-14 NOTE — Patient Instructions (Signed)
Erin Myers-- it was great meeting you today...  Today we checked a CXR, a pulmonary function test, and an ambulatory oximetry test...    We will contact you w/ the results when available...   Your shortness of breath appears to be related to a type of chest wall muscle spasm that restricts the inflow of air so you do not feel satisfied breathing...  I would like you to try a combination relaxer -- KLONOPIN 0.'5mg'$  tabs, one tab twice daily...  Please call me in 2-3 weeks to let me know how this is working & to see if we need to make a mid-course dose adjustment...  Let's plan a follow up visit in 6-8 weeks, sooner if needed for problems.Marland KitchenMarland Kitchen

## 2015-05-14 NOTE — Progress Notes (Signed)
Subjective:     Patient ID: Erin Myers, female   DOB: 06-05-1940, 75 y.o.   MRN: [id number]  HPI ~  May 14, 2015:  Initial pulmonary consult by SN>   7 y/o WF referred by DrAthar- Neurology,  for a pulmonary evaluation due to dyspnea; her PCP is Dr. Jeanine Luz relates a hx of SOB dating back ~77yr it appears to be quite variable- sometimes occuring w/ exertion & other times when she is just resting; It is described as a feeling of not being able to get a deep breath, can't get the air "IN", and not satisfied breathing "I can't get oxygen"; she had a URI in [id number] w/ cough which has mostly resolved but her intermittent SOB is unchanged; she also has a discomfort in her upper chest; she notes that DrPaterson has done CXRs and Spirometry in the past- she was told they were OK; she has been under a lot of stress- husb ill, out of work, PTSD...  Smoking Hx>  She is an ex-smoker, starting at 89 smoked for 311yrup to 1ppd, quit in 1993 when she was around grandchildren...  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  Medical Hx>  Hx HBP, palpitations, HL, DM (diet controlled), Obesity, Hypothyroid, colon polyps/ divertics/ hems, Ovarian Ca, Anxiety & PTSD... She has prev seen DrLittle for the palpit (now DrHarding).  Family Hx>  Neg for hx respiratory problems; pos for heart disease & stroke...  Occup Hx>  Former flAdvertising copywriterNo known exposures to asbestos, silica dust, other inorganic or organic dusts, etc...  Current Meds>  ASA81, ToprolXL50-3/d, Losar50, Crestor10, Synthroid112, Tranxene7.5- taking 1/2Bid...  EXAM shows Afeb, VSS, O2sat=97% on RA; wt=215#,  5'5"Tall, BMI=36;  HEENT- neg, mallampati2;  Chest- clear w/o w/r/r;  Heart- RR w/o m/r/g;  Abd- soft, nontender, neg;  Ext- neg w/o c/c/e;  Neuro- intact w/o focal abn...  CXR 05/14/15>  Norm heart size, post surg changes on right, clear lungs, NAD...  Spirometry 05/14/15>  FVC=2.42 (82%), FEV1=1.86 (83%), %1sec=77%, mid-flows are wnl at 84% predicted... This is a normal spirometry w/ lung volumes at lower lim of norm...  Ambulatory oximetry 05/14/15>  O2sat=95% on RA at rest;  She ambulated 3 laps in the office w/ lowest O2sat=93% w/ max HR=94/min... Note- she had a neg Myoview by Cards in 2009.                    CXR 05/14/15    IMP >>     Dyspnea>  This is likely multifactorial w/ major components from anxiety, deconditioning, sedentary lifestyle, obesity...    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    OSA on CPAP>  eval & management by DrAthar for Neurology...    Spirometry & O2sats are all wnl...    Ex-smoker, quit 1993, 30 pack-yr smoking hx    CARDIAC issues>  HBP & Palpitations followed by DrHarding...    MEDICAL issues>  HBP, palpitations, HL, DM (diet controlled), Obesity, Hypothyroid, colon polyps/ divertics/ hems, Ovarian Ca, Anxiety & PTSD PLAN >>     I spent  some time in the office explaining how "chest wall musc spasm" produces the SOB sensation of not getting enough air "IN" etc; she has been on Tranxene which clearly is not working so I proposed a change to a "combination relaxer" like KLONOPIN 0.'5mg'$  Bid... She will let me know how this is working for her so we can make any dose adjustments over the phone, and we plan ROV recheck in about 6 weeks...    Past Medical History  Diagnosis Date  . HTN (hypertension)   . Dyslipidemia   . Hypothyroid   . Palpitations 2009 and 2006    Negative nuclear stress test   . Sleep apnea     on C-pap  . Obesity   . Diabetes mellitus without complication ([place])   . Hypothyroidism   .  Hyperlipemia   . Anxiety   . Lung cancer ([place]) 2000    Carcinoid, s/p RLL lobectomy  . Ovarian cancer [facility])     Past Surgical History  Procedure Laterality Date  . Oophorectomy  2001  . Abdominal hysterectomy  2001  . Lobectomy  2000    RLL  . Nm myocar perf wall motion  08/20/2007    EF 74%  EXERCISE CAPACITY 7 METS  . Tonsillectomy      Outpatient Encounter Prescriptions as of 05/14/2015  Medication Sig  . aspirin 81 MG tablet Take 81 mg by mouth daily.  [name] levothyroxine (SYNTHROID, LEVOTHROID) 112 MCG tablet Take 112 mcg by mouth daily before breakfast.  . losartan-hydrochlorothiazide (HYZAAR) 50-12.5 MG per tablet   . metoprolol succinate (TOPROL-XL) 50 MG 24 hr tablet Take 75 mg by mouth 2 (two) times daily.   . rosuvastatin (CRESTOR) 10 MG tablet Take 10 mg by mouth daily.  . [DISCONTINUED] clorazepate (TRANXENE) 7.5 MG tablet Take 7.5 mg by mouth 2 (two) times daily as needed for anxiety.  . clonazePAM (KLONOPIN) 0.5 MG tablet Take 1 tablet (0.5 mg total) by mouth 2 (two) times daily.   No facility-administered encounter medications on file as of 05/14/2015.    Allergies  Allergen Reactions  . Adhesive [Tape]   . Codeine   . Lactose Intolerance (Gi) Diarrhea  . Sulfa Antibiotics     Family History  Problem Relation Age of Onset  . Coronary artery disease Mother 56    died of an MI  . Heart attack Mother   . CVA Brother 51  . CVA Father   . Suicidality Sister     Social History   Social History  . Marital Status: Married    Spouse Name: N/A  . Number of Children: 2  . Years of Education: HS   Occupational History  . Insurance     Social History Main Topics  . Smoking status: Former Smoker -- 0.75 packs/day for 30 years    Types: Cigarettes    Quit date: 08/18/1991  . Smokeless tobacco: Never Used  . Alcohol Use: No  . Drug Use: No  . Sexual Activity: Not on file   Other Topics Concern  . Not on file   Social History Narrative   1-2 glasses of  tea a day     Current Medications, Allergies, Past Medical History, Past Surgical History, Family History, and Social History were reviewed in [employer] record.   Review of Systems             All symptoms NEG except where BOLDED >>  Constitutional:  F/C/S, fatigue, anorexia, unexpected weight  change. HEENT:  HA, visual changes, hearing loss, earache, nasal symptoms, sore throat, mouth sores, hoarseness. Resp:  cough, sputum, hemoptysis; SOB, tightness, wheezing. Cardio:  CP, palpit, DOE, orthopnea, edema. GI:  N/V/D/C, blood in stool; reflux, abd pain, distention, gas. GU:  dysuria, freq, urgency, hematuria, flank pain, voiding difficulty. MS:  joint pain, swelling, tenderness, decr ROM; neck pain, back pain, etc. Neuro:  HA, tremors, seizures, dizziness, syncope, weakness, numbness, gait abn. Skin:  suspicious lesions or skin rash. Heme:  adenopathy, bruising, bleeding. Psyche:  confusion, agitation, sleep disturbance, hallucinations, anxiety, depression suicidal.   Objective:   Physical Exam       Vital Signs:  Reviewed...  General:  WD, overweight, 75 y/o WF in NAD; alert & oriented; pleasant & cooperative... HEENT:  [place]/AT; Conjunctiva- pink, Sclera- nonicteric, EOM-wnl, PERRLA, EACs-clear, TMs-wnl; NOSE-clear; THROAT-clear & wnl. Neck:  Supple w/ fair ROM; no JVD; normal carotid impulses w/o bruits; no thyromegaly or nodules palpated; no lymphadenopathy. Chest:  Clear to P & A; without wheezes, rales, or rhonchi heard. Heart:  Regular Rhythm; norm S1 & S2 without murmurs, rubs, or gallops detected. Abdomen:  Obese, soft & nontender- no guarding or rebound; normal bowel sounds; no organomegaly or masses palpated. Ext:  Normal ROM; without deformities +arthritic changes; no varicose veins, +venous insuffic, or edema;  Pulses intact w/o bruits. Neuro:  CNs II-XII intact; motor testing normal; sensory testing normal; gait normal & balance OK. Derm:  No  lesions noted; no rash etc. Lymph:  No cervical, supraclavicular, axillary, or inguinal adenopathy palpated.   Assessment:      IMP >>     Dyspnea>  This is likely multifactorial w/ major components from anxiety, deconditioning, sedentary lifestyle, obesity...    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    OSA on CPAP>  eval & management by DrAthar for Neurology...    Spirometry & O2sats are all wnl...    Ex-smoker, quit 1993, 30 pack-yr smoking hx    CARDIAC issues>  HBP & Palpitations followed by DrHarding...    MEDICAL issues>  HBP, palpitations, HL, DM (diet controlled), Obesity, Hypothyroid, colon polyps/ divertics/ hems, Ovarian Ca, Anxiety & PTSD  PLAN >>     I spent some time in the office explaining how "chest wall musc spasm" produces the SOB sensation of not getting enough air "IN" etc; she has been on Tranxene which clearly is not working so I proposed a change to a "combination relaxer" like KLONOPIN 0.'5mg'$  Bid... She will let me know how this is working for her so we can make any dose adjustments over the phone, and we plan ROV recheck in about 6 weeks...     Plan:     Patient's Medications  New Prescriptions   CLONAZEPAM (KLONOPIN) 0.5 MG TABLET    Take 1 tablet (0.5 mg total) by mouth 2 (two) times daily.  Previous Medications   ASPIRIN 81 MG TABLET    Take 81 mg by mouth daily.   LEVOTHYROXINE (SYNTHROID, LEVOTHROID) 112 MCG TABLET    Take 112 mcg by mouth daily before breakfast.   LOSARTAN-HYDROCHLOROTHIAZIDE (HYZAAR) 50-12.5 MG PER TABLET       METOPROLOL SUCCINATE (TOPROL-XL) 50 MG 24 HR TABLET    Take 75 mg by mouth 2 (two) times daily.    ROSUVASTATIN (CRESTOR) 10 MG TABLET    Take 10 mg by mouth daily.  Modified Medications   No medications on file  Discontinued  Medications   CLORAZEPATE (TRANXENE) 7.5 MG TABLET    Take 7.5 mg by mouth 2 (two) times daily as needed for anxiety.

## 2015-05-15 NOTE — Progress Notes (Signed)
Quick Note:  lmtcb for pt. ______ 

## 2015-05-18 ENCOUNTER — Telehealth: Payer: Self-pay | Admitting: Pulmonary Disease

## 2015-05-18 NOTE — Telephone Encounter (Signed)
Notes Recorded by Noralee Space, MD on 05/15/2015 at 8:44 AM Please notify patient>  CXR looks OK w/ norm heart size, post op changes on right, essentially clear lungs/ no acute dis... --------------------------------  Spoke with pt, aware of results/recs.  Nothing further needed.

## 2015-05-21 DIAGNOSIS — G4733 Obstructive sleep apnea (adult) (pediatric): Secondary | ICD-10-CM | POA: Diagnosis not present

## 2015-05-26 DIAGNOSIS — L821 Other seborrheic keratosis: Secondary | ICD-10-CM | POA: Diagnosis not present

## 2015-05-26 DIAGNOSIS — D485 Neoplasm of uncertain behavior of skin: Secondary | ICD-10-CM | POA: Diagnosis not present

## 2015-05-26 DIAGNOSIS — G4733 Obstructive sleep apnea (adult) (pediatric): Secondary | ICD-10-CM | POA: Diagnosis not present

## 2015-05-26 DIAGNOSIS — L820 Inflamed seborrheic keratosis: Secondary | ICD-10-CM | POA: Diagnosis not present

## 2015-06-21 DIAGNOSIS — G4733 Obstructive sleep apnea (adult) (pediatric): Secondary | ICD-10-CM | POA: Diagnosis not present

## 2015-06-25 ENCOUNTER — Ambulatory Visit: Payer: PPO | Admitting: Pulmonary Disease

## 2015-06-30 ENCOUNTER — Ambulatory Visit: Payer: PPO | Admitting: Neurology

## 2015-07-21 DIAGNOSIS — G4733 Obstructive sleep apnea (adult) (pediatric): Secondary | ICD-10-CM | POA: Diagnosis not present

## 2015-07-22 DIAGNOSIS — G4733 Obstructive sleep apnea (adult) (pediatric): Secondary | ICD-10-CM | POA: Diagnosis not present

## 2015-08-11 ENCOUNTER — Encounter: Payer: Self-pay | Admitting: Cardiology

## 2015-08-11 ENCOUNTER — Ambulatory Visit (INDEPENDENT_AMBULATORY_CARE_PROVIDER_SITE_OTHER): Payer: PPO | Admitting: Cardiology

## 2015-08-11 VITALS — BP 136/86 | HR 66 | Ht 66.0 in | Wt 221.6 lb

## 2015-08-11 DIAGNOSIS — E785 Hyperlipidemia, unspecified: Secondary | ICD-10-CM

## 2015-08-11 DIAGNOSIS — E669 Obesity, unspecified: Secondary | ICD-10-CM

## 2015-08-11 DIAGNOSIS — I10 Essential (primary) hypertension: Secondary | ICD-10-CM | POA: Diagnosis not present

## 2015-08-11 DIAGNOSIS — R002 Palpitations: Secondary | ICD-10-CM | POA: Diagnosis not present

## 2015-08-11 DIAGNOSIS — R0609 Other forms of dyspnea: Secondary | ICD-10-CM

## 2015-08-11 NOTE — Progress Notes (Signed)
PCP: Donnajean Lopes, MD  Clinic Note: Chief Complaint  Patient presents with  . Follow-up    SOB; when active, Edema; ankles. DIZZINESS; ocassionally.  . Shortness of Breath  . Palpitations    HPI: Erin Myers is a 75 y.o. female with a PMH below who presents today for Close to 2 year follow-up of palpitations. She is actually here to discuss exertional dyspnea and edema.Erin Myers was last seen in September 2015 for follow-up palpitations. She is relatively stable on current dose of Toprol. Otherwise doing relatively well.  Recent Hospitalizations: None  Studies Reviewed: None  Interval History: Erin Myers presents today really with noting that she's been a little more short of breath and usual, especially with exertion. She is not able to do the same amount of exercise or activity that she had been doing. She also has a little bit of edema and orthopnea symptoms. No PND. She denies any chest tightness or pressure with exertion. Her palpitations seem to be well controlled with only rare fleeting episodes. She is dizzy when short of breath, but denies any dizziness associated with any palpitations. No syncope/near syncope or TIA/amaurosis fugax.  She denies any rapid irregular heartbeats to suggest an arrhythmia. Stress level has improved. Apparently her thyroid levels have also stabilized.  ROS: A comprehensive was performed. Review of Systems  Constitutional: Positive for weight loss (She is actually gained weight.). Negative for malaise/fatigue.  HENT: Negative for congestion and nosebleeds.   Respiratory: Positive for shortness of breath (Mostly exertional). Negative for cough and wheezing.   Gastrointestinal: Negative for abdominal pain, blood in stool and melena.  Genitourinary: Negative for hematuria.  Musculoskeletal: Positive for joint pain (Arthritis pains). Negative for myalgias.  Neurological: Positive for dizziness. Negative for tingling, sensory  change, speech change, loss of consciousness and headaches.  Endo/Heme/Allergies: Does not bruise/bleed easily.  Psychiatric/Behavioral: Negative for depression and memory loss. The patient is nervous/anxious. The patient does not have insomnia.   All other systems reviewed and are negative.   Past Medical History  Diagnosis Date  . HTN (hypertension)   . Dyslipidemia   . Hypothyroid   . Palpitations 2009 and 2006    Negative nuclear stress test   . Sleep apnea     on C-pap  . Obesity   . Diabetes mellitus without complication ([place])   . Hypothyroidism   . Hyperlipemia   . Anxiety   . 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 Ovarian cancer [facility])     Past Surgical History  Procedure Laterality Date  . Oophorectomy  2001  . Abdominal hysterectomy  2001  . Lobectomy  2000    RLL  . Nm myocar perf wall motion  08/20/2007    EF 74%  EXERCISE CAPACITY 7 METS  . Tonsillectomy      Prior to Admission medications   Medication Sig Start Date End Date Taking? Authorizing Provider  aspirin 81 MG tablet Take 81 mg by mouth daily.   Yes Historical Provider, MD  clorazepate (TRANXENE) 7.5 MG tablet Take 7.5 mg by mouth 2 (two) times daily as needed for anxiety.   Yes Historical Provider, MD  levothyroxine (SYNTHROID, LEVOTHROID) 112 MCG tablet Take 112 mcg by mouth daily before breakfast.   Yes Historical Provider, MD  losartan-hydrochlorothiazide (HYZAAR) 50-12.5 MG per tablet  11/11/13  Yes Historical Provider, MD  metoprolol succinate (TOPROL-XL) 50 MG 24 hr tablet Take 75 mg by mouth 2 (  two) times daily.    Yes Historical Provider, MD  rosuvastatin (CRESTOR) 10 MG tablet Take 10 mg by mouth daily.   Yes Historical Provider, MD    Allergies  Allergen Reactions  . Adhesive [Tape]   . Codeine   . Lactose Intolerance (Gi) Diarrhea  . Sulfa Antibiotics     Social History   Social History  . Marital Status: Married    Spouse Name: N/A  . Number of Children: 2    . Years of Education: HS   Occupational History  . Insurance     Social History Main Topics  . Smoking status: Former Smoker -- 0.75 packs/day for 30 years    Types: Cigarettes    Quit date: 08/18/1991  . Smokeless tobacco: Never Used  . Alcohol Use: No  . Drug Use: No  . Sexual Activity: Not Asked   Other Topics Concern  . None   Social History Narrative   1-2 glasses of tea a day     family history includes CVA in her father; CVA (age of onset: 82) in her brother; Coronary artery disease (age of onset: 24) in her mother; Heart attack in her mother; Suicidality in her sister.   Wt Readings from Last 3 Encounters:  08/11/15 221 lb 9.6 oz (100.517 kg)  05/14/15 215 lb 3.2 oz (97.614 kg)  03/30/15 212 lb (96.163 kg)    PHYSICAL EXAM BP 136/86 mmHg  Pulse 66  Ht '5\' 6"'$  (1.676 m)  Wt 221 lb 9.6 oz (100.517 kg)  BMI 35.78 kg/m2 General appearance: alert, cooperative, appears stated age, no distress, moderately obese and Normal mood & Affect. Well groomed, healthy appearing  HEENT: [place]/AT, EOMI, MMM, anicteric sclerae Neck: no adenopathy, no carotid bruit, no JVD, supple, symmetrical, trachea midline and thyroid not enlarged, symmetric, no tenderness/mass/nodules  Lungs: clear to auscultation bilaterally, normal percussion bilaterally and non-labored  Heart: regular rate and rhythm, S1, S2 normal, no murmur, click, rub or gallop and normal apical impulse  Abdomen: soft, non-tender; bowel sounds normal; no masses, no organomegaly and moderate truncal obesity  Extremities: extremities normal, atraumatic, no cyanosis or edema, no edema, redness or tenderness in the calves or thighs and no ulcers, gangrene or trophic changes  Pulses: 2+ and symmetric  Neurologic: Grossly normal     Adult ECG Report  Rate: 61 ;  Rhythm: normal sinus rhythm and Low voltage. But otherwise normal axis, intervals and durations.;   Narrative Interpretation: Normal EKG   Other studies  Reviewed: Additional studies/ records that were reviewed today include:  Recent Labs:  None available.   ASSESSMENT / PLAN: Problem List Items Addressed This Visit    Palpitations (Chronic)    Well-controlled on current dose of metoprolol.      Relevant Orders   EKG 12-Lead (Completed)   EXERCISE TOLERANCE TEST   Obesity (BMI 30-39.9) (Chronic)    She is hoping to get back into exercise regimen, but would like to be sure that the exertional dyspnea is not cardiac in nature. Therefore we are doing the GXT in order to see how she does for risk stratification. The patient understands the need to lose weight with diet and exercise. We have discussed specific strategies for this.       Essential hypertension (Chronic)    Borderline control, stable on metoprolol and ARB/HCTZ. - With mild edema, could consider increasing Hyzaar to 50/25 mg.      Dyslipidemia, goal LDL below 130 (Chronic)    Continues  on Crestor. Labs followed by PCP. No myalgias      Relevant Orders   EKG 12-Lead (Completed)   EXERCISE TOLERANCE TEST   DOE (dyspnea on exertion) - Primary    This is probably related related to deconditioning and obesity. However would like to exclude coronary disease is a possibility.  Plan: GXT - Graded Exercise Tolerance Test      Relevant Orders   EKG 12-Lead (Completed)   EXERCISE TOLERANCE TEST      Current medicines are reviewed at length with the patient today. (+/- concerns) none The following changes have been made: none  Studies Ordered:   Orders Placed This Encounter  Procedures  . EXERCISE TOLERANCE TEST  . EKG 12-Lead   ROV in 1 yr unless GXT is abnormal.   Glenetta Hew, M.D., M.S. Interventional Cardiologist   Pager # [phone] Phone # [phone] [address] [place], [place] [address]

## 2015-08-11 NOTE — Patient Instructions (Signed)
NO CHANGE WITH CURRENT MEDICATIONS   Your physician has requested that you have an exercise tolerance test. For further information please visit [email or web address]. Please also follow instruction sheet, as given.    Your physician wants you to follow-up in [place].  You will receive a reminder letter in the mail two months in advance. If you don't receive a letter, please call our office to schedule the follow-up appointment.  If you need a refill on your cardiac medications before your next appointment, please call your pharmacy.

## 2015-08-13 ENCOUNTER — Encounter: Payer: Self-pay | Admitting: Cardiology

## 2015-08-13 HISTORY — PX: OTHER SURGICAL HISTORY: SHX169

## 2015-08-13 NOTE — Assessment & Plan Note (Signed)
Well-controlled on current dose of metoprolol 

## 2015-08-13 NOTE — Assessment & Plan Note (Signed)
She is hoping to get back into exercise regimen, but would like to be sure that the exertional dyspnea is not cardiac in nature. Therefore we are doing the GXT in order to see how she does for risk stratification. The patient understands the need to lose weight with diet and exercise. We have discussed specific strategies for this.

## 2015-08-13 NOTE — Assessment & Plan Note (Signed)
Borderline control, stable on metoprolol and ARB/HCTZ. - With mild edema, could consider increasing Hyzaar to 50/25 mg.

## 2015-08-13 NOTE — Assessment & Plan Note (Signed)
This is probably related related to deconditioning and obesity. However would like to exclude coronary disease is a possibility.  Plan: GXT - Graded Exercise Tolerance Test

## 2015-08-13 NOTE — Assessment & Plan Note (Addendum)
Continues on Crestor. Labs followed by PCP. No myalgias

## 2015-08-18 ENCOUNTER — Telehealth (HOSPITAL_COMMUNITY): Payer: Self-pay

## 2015-08-18 DIAGNOSIS — R8299 Other abnormal findings in urine: Secondary | ICD-10-CM | POA: Diagnosis not present

## 2015-08-18 DIAGNOSIS — R7309 Other abnormal glucose: Secondary | ICD-10-CM | POA: Diagnosis not present

## 2015-08-18 DIAGNOSIS — E038 Other specified hypothyroidism: Secondary | ICD-10-CM | POA: Diagnosis not present

## 2015-08-18 DIAGNOSIS — I10 Essential (primary) hypertension: Secondary | ICD-10-CM | POA: Diagnosis not present

## 2015-08-18 DIAGNOSIS — E784 Other hyperlipidemia: Secondary | ICD-10-CM | POA: Diagnosis not present

## 2015-08-18 DIAGNOSIS — N390 Urinary tract infection, site not specified: Secondary | ICD-10-CM | POA: Diagnosis not present

## 2015-08-18 NOTE — Telephone Encounter (Signed)
Encounter complete. 

## 2015-08-20 ENCOUNTER — Inpatient Hospital Stay (HOSPITAL_COMMUNITY): Admission: RE | Admit: 2015-08-20 | Payer: PPO | Source: Ambulatory Visit

## 2015-08-20 ENCOUNTER — Ambulatory Visit (HOSPITAL_COMMUNITY)
Admission: RE | Admit: 2015-08-20 | Discharge: 2015-08-20 | Disposition: A | Discharge status: Home / Self Care | Payer: PPO | Source: Ambulatory Visit | Attending: Cardiology | Admitting: Cardiology

## 2015-08-20 DIAGNOSIS — R0609 Other forms of dyspnea: Secondary | ICD-10-CM | POA: Diagnosis not present

## 2015-08-20 DIAGNOSIS — R9439 Abnormal result of other cardiovascular function study: Secondary | ICD-10-CM | POA: Diagnosis not present

## 2015-08-20 DIAGNOSIS — E785 Hyperlipidemia, unspecified: Secondary | ICD-10-CM

## 2015-08-20 DIAGNOSIS — R002 Palpitations: Secondary | ICD-10-CM | POA: Diagnosis not present

## 2015-08-21 DIAGNOSIS — L820 Inflamed seborrheic keratosis: Secondary | ICD-10-CM | POA: Diagnosis not present

## 2015-08-21 DIAGNOSIS — L570 Actinic keratosis: Secondary | ICD-10-CM | POA: Diagnosis not present

## 2015-08-21 DIAGNOSIS — G4733 Obstructive sleep apnea (adult) (pediatric): Secondary | ICD-10-CM | POA: Diagnosis not present

## 2015-08-21 LAB — EXERCISE TOLERANCE TEST
CHL RATE OF PERCEIVED EXERTION: 17
CSEPED: 4 min
CSEPEW: 5.8 METS
CSEPPHR: 142 {beats}/min
MPHR: 148 {beats}/min
Percent HR: 95 %
Rest HR: 84 {beats}/min

## 2015-08-24 DIAGNOSIS — I10 Essential (primary) hypertension: Secondary | ICD-10-CM | POA: Diagnosis not present

## 2015-08-24 DIAGNOSIS — Z0000 Encounter for general adult medical examination without abnormal findings: Secondary | ICD-10-CM | POA: Diagnosis not present

## 2015-08-24 DIAGNOSIS — E784 Other hyperlipidemia: Secondary | ICD-10-CM | POA: Diagnosis not present

## 2015-08-24 DIAGNOSIS — Z6837 Body mass index (BMI) 37.0-37.9, adult: Secondary | ICD-10-CM | POA: Diagnosis not present

## 2015-08-24 DIAGNOSIS — R0609 Other forms of dyspnea: Secondary | ICD-10-CM | POA: Diagnosis not present

## 2015-08-24 DIAGNOSIS — E038 Other specified hypothyroidism: Secondary | ICD-10-CM | POA: Diagnosis not present

## 2015-08-24 DIAGNOSIS — M545 Low back pain: Secondary | ICD-10-CM | POA: Diagnosis not present

## 2015-08-24 DIAGNOSIS — E668 Other obesity: Secondary | ICD-10-CM | POA: Diagnosis not present

## 2015-08-24 DIAGNOSIS — G4733 Obstructive sleep apnea (adult) (pediatric): Secondary | ICD-10-CM | POA: Diagnosis not present

## 2015-08-24 DIAGNOSIS — C3431 Malignant neoplasm of lower lobe, right bronchus or lung: Secondary | ICD-10-CM | POA: Diagnosis not present

## 2015-08-24 DIAGNOSIS — R7309 Other abnormal glucose: Secondary | ICD-10-CM | POA: Diagnosis not present

## 2015-08-27 DIAGNOSIS — Z1212 Encounter for screening for malignant neoplasm of rectum: Secondary | ICD-10-CM | POA: Diagnosis not present

## 2015-09-17 DIAGNOSIS — G4733 Obstructive sleep apnea (adult) (pediatric): Secondary | ICD-10-CM | POA: Diagnosis not present

## 2015-09-20 DIAGNOSIS — G4733 Obstructive sleep apnea (adult) (pediatric): Secondary | ICD-10-CM | POA: Diagnosis not present

## 2015-10-13 DIAGNOSIS — H02834 Dermatochalasis of left upper eyelid: Secondary | ICD-10-CM | POA: Diagnosis not present

## 2015-10-13 DIAGNOSIS — H2513 Age-related nuclear cataract, bilateral: Secondary | ICD-10-CM | POA: Diagnosis not present

## 2015-10-13 DIAGNOSIS — H52222 Regular astigmatism, left eye: Secondary | ICD-10-CM | POA: Diagnosis not present

## 2015-10-13 DIAGNOSIS — H5203 Hypermetropia, bilateral: Secondary | ICD-10-CM | POA: Diagnosis not present

## 2015-10-13 DIAGNOSIS — H524 Presbyopia: Secondary | ICD-10-CM | POA: Diagnosis not present

## 2015-10-21 DIAGNOSIS — G4733 Obstructive sleep apnea (adult) (pediatric): Secondary | ICD-10-CM | POA: Diagnosis not present

## 2015-10-26 DIAGNOSIS — H0270 Unspecified degenerative disorders of eyelid and periocular area: Secondary | ICD-10-CM | POA: Diagnosis not present

## 2015-11-21 DIAGNOSIS — G4733 Obstructive sleep apnea (adult) (pediatric): Secondary | ICD-10-CM | POA: Diagnosis not present

## 2015-11-30 DIAGNOSIS — H0230 Blepharochalasis unspecified eye, unspecified eyelid: Secondary | ICD-10-CM | POA: Diagnosis not present

## 2015-12-15 DIAGNOSIS — E668 Other obesity: Secondary | ICD-10-CM | POA: Diagnosis not present

## 2015-12-15 DIAGNOSIS — I10 Essential (primary) hypertension: Secondary | ICD-10-CM | POA: Diagnosis not present

## 2015-12-15 DIAGNOSIS — Z23 Encounter for immunization: Secondary | ICD-10-CM | POA: Diagnosis not present

## 2015-12-15 DIAGNOSIS — G4733 Obstructive sleep apnea (adult) (pediatric): Secondary | ICD-10-CM | POA: Diagnosis not present

## 2015-12-15 DIAGNOSIS — R7309 Other abnormal glucose: Secondary | ICD-10-CM | POA: Diagnosis not present

## 2015-12-15 DIAGNOSIS — Z6836 Body mass index (BMI) 36.0-36.9, adult: Secondary | ICD-10-CM | POA: Diagnosis not present

## 2015-12-21 DIAGNOSIS — G4733 Obstructive sleep apnea (adult) (pediatric): Secondary | ICD-10-CM | POA: Diagnosis not present

## 2016-01-21 DIAGNOSIS — G4733 Obstructive sleep apnea (adult) (pediatric): Secondary | ICD-10-CM | POA: Diagnosis not present

## 2016-01-26 ENCOUNTER — Other Ambulatory Visit: Payer: Self-pay | Admitting: Family Medicine

## 2016-01-26 ENCOUNTER — Ambulatory Visit
Admission: RE | Admit: 2016-01-26 | Discharge: 2016-01-26 | Disposition: A | Discharge status: Home / Self Care | Payer: PPO | Source: Ambulatory Visit | Attending: Family Medicine | Admitting: Family Medicine

## 2016-01-26 DIAGNOSIS — M25551 Pain in right hip: Secondary | ICD-10-CM | POA: Diagnosis not present

## 2016-01-26 DIAGNOSIS — G8929 Other chronic pain: Secondary | ICD-10-CM

## 2016-01-26 DIAGNOSIS — M5431 Sciatica, right side: Secondary | ICD-10-CM

## 2016-01-26 DIAGNOSIS — M545 Low back pain: Secondary | ICD-10-CM | POA: Diagnosis not present

## 2016-01-26 DIAGNOSIS — M5136 Other intervertebral disc degeneration, lumbar region: Secondary | ICD-10-CM | POA: Diagnosis not present

## 2016-01-26 DIAGNOSIS — Z6836 Body mass index (BMI) 36.0-36.9, adult: Secondary | ICD-10-CM | POA: Diagnosis not present

## 2016-02-08 ENCOUNTER — Ambulatory Visit: Payer: PPO | Attending: Internal Medicine

## 2016-02-08 DIAGNOSIS — M6281 Muscle weakness (generalized): Secondary | ICD-10-CM | POA: Insufficient documentation

## 2016-02-08 DIAGNOSIS — M545 Low back pain, unspecified: Secondary | ICD-10-CM

## 2016-02-08 DIAGNOSIS — M2560 Stiffness of unspecified joint, not elsewhere classified: Secondary | ICD-10-CM

## 2016-02-08 DIAGNOSIS — M25659 Stiffness of unspecified hip, not elsewhere classified: Secondary | ICD-10-CM

## 2016-02-08 DIAGNOSIS — R293 Abnormal posture: Secondary | ICD-10-CM | POA: Diagnosis not present

## 2016-02-08 DIAGNOSIS — R262 Difficulty in walking, not elsewhere classified: Secondary | ICD-10-CM | POA: Insufficient documentation

## 2016-02-08 DIAGNOSIS — G8929 Other chronic pain: Secondary | ICD-10-CM | POA: Insufficient documentation

## 2016-02-08 NOTE — Therapy (Signed)
[place], [place], [address] Phone: [phone]   Fax:  [phone]  Physical Therapy Evaluation  Patient Details  Name: Erin Myers MRN: [id number] Date of Birth: [birth year] Referring Provider: [name], MD  Encounter Date: 02/08/2016      PT End of Session - 02/08/16 1507    Visit Number 1   Number of Visits 12   Date for PT Re-Evaluation 03/21/16   Authorization Type [employer] MCR   PT Start Time 0215   PT Stop Time 0300   PT Time Calculation (min) 45 min   Activity Tolerance Patient tolerated treatment well;No increased pain   Behavior During Therapy WFL for tasks assessed/performed      Past Medical History:  Diagnosis Date  . Anxiety   . Diabetes mellitus without complication ([place])   . Dyslipidemia   . HTN (hypertension)   . Hyperlipemia   . Hypothyroid   . Hypothyroidism   . Lung cancer ([place]) 2000   Carcinoid, s/p RLL lobectomy  . Obesity   . Ovarian cancer ([place])   . Palpitations 2009 and 2006   Negative nuclear stress test   . Sleep apnea    on C-pap    Past Surgical History:  Procedure Laterality Date  . ABDOMINAL HYSTERECTOMY  2001  . LOBECTOMY  2000   RLL  . NM MYOCAR PERF WALL MOTION  08/20/2007   EF 74%  EXERCISE CAPACITY 7 METS  . OOPHORECTOMY  2001  . TONSILLECTOMY      There were no vitals filed for this visit.       Subjective Assessment - 02/08/16 1422    Subjective LBP and RT hip  with catch on flexing cooming up from flexion.    Wanted to try PT before injections.  No injury reported.      Pertinent History Lung cancer , removal RT lower lobe   How long can you sit comfortably? As needed   How long can you stand comfortably? 20 min   How long can you walk comfortably? 20 min   Diagnostic tests Xray DDD /OA   Patient Stated Goals Decrease pain in back and hip   Currently in Pain? Yes   Pain Score 3    Pain Location Back   Pain Orientation Lower;Right;Left  more RT   Pain Descriptors / Indicators Sore  grabbing pain    Pain Type Chronic pain   Pain Radiating Towards buttock RT    Pain Onset More than a month ago   Pain Frequency Intermittent  wakes without pain at times   Aggravating Factors  bending , standing /shopping   Pain Relieving Factors advil, cold   Multiple Pain Sites No            [facility] PT Assessment - 02/08/16 0001      Assessment   Medical Diagnosis Lower back pain   Referring Provider [name], MD   Onset Date/Surgical Date --  2 years ago started. worse over time   Prior Therapy no     Precautions   Precautions None     Restrictions   Weight Bearing Restrictions No     Balance Screen   Has the patient fallen in the past 6 months No   Has the patient had a decrease in activity level because of a fear of falling?  No     Prior Function   Level of Independence Independent  Cognition   Overall Cognitive Status Within Functional Limits for tasks assessed     Observation/Other Assessments   Focus on Therapeutic Outcomes (FOTO)  47% limited     Posture/Postural Control   Posture Comments forward head and decreased lumbar lordosis     ROM / Strength   AROM / PROM / Strength AROM;Strength     AROM   AROM Assessment Site Lumbar   Lumbar Flexion 60   Lumbar Extension 20   Lumbar - Right Side Bend 15   Lumbar - Left Side Bend 15   Lumbar - Right Rotation 40   Lumbar - Left Rotation 40     Strength   Overall Strength Comments WNL both LE with some pain in RT foot due to plantar fascitis. poor core strength     Flexibility   Soft Tissue Assessment /Muscle Length yes   Hamstrings RT 55 LT 60     Palpation   Palpation comment Stiffness of spine, tender RT lower lumbar paraspinals     Ambulation/Gait   Gait Comments WNL                           PT Education - 02/08/16 1506    Education provided Yes   Education Details POS, HEP,  core stability   Person(s) Educated Patient   Methods Explanation;Demonstration;Verbal cues;Handout;Tactile cues   Comprehension Returned demonstration;Verbalized understanding          PT Short Term Goals - 02/08/16 1511      PT SHORT TERM GOAL #1   Title Independent with inital HEP   Time 3   Period Weeks   Status New     PT SHORT TERM GOAL #2   Title She will report decr pain in back 30% or more   Time 3   Period Weeks   Status New     PT SHORT TERM GOAL #3   Title report decreased catching in RT hip with bending flexed trunk   Time 3   Period Weeks           PT Long Term Goals - 02/08/16 1512      PT LONG TERM GOAL #1   Title She will be independent with all hEP issued    Time 6   Period Weeks   Status New     PT LONG TERM GOAL #2   Title She will report pain decreased75% or more with standing and walking allowing for 30 min or more of activity on feet.    Time 6   Period Weeks   Status New     PT LONG TERM GOAL #3   Title She will be able to bend over without catch in RT hip   Time 6   Period Weeks   Status New               Plan - 02/08/16 1507    Clinical Impression Statement Ms Koehl presents for moderate complexity eval for chronic LBP RT with catch to RT hip on returning from flexing. She has decreased ROM back and hips , spasm , abnormal posture decrease lordosis with mild sway back in standing weakness of core   Rehab Potential Good   PT Frequency 2x / week   PT Duration 6 weeks   PT Treatment/Interventions Cryotherapy;Electrical Stimulation;Iontophoresis '4mg'$ /ml Dexamethasone;Ultrasound;Passive range of motion;Patient/family education;Manual techniques;Taping;Therapeutic exercise;Dry needling   PT Next Visit Plan Manual, for ROM hips and back ,  modalities as needed , core strength   PT Home Exercise Plan posterior pelvic tilt and deep breathing   Consulted and Agree with Plan of Care Patient      Patient will benefit from skilled  therapeutic intervention in order to improve the following deficits and impairments:  Pain, Postural dysfunction, Decreased strength, Decreased activity tolerance, Decreased range of motion, Difficulty walking, Increased muscle spasms  Visit Diagnosis: Chronic right-sided low back pain without sciatica - Plan: PT plan of care cert/re-cert  Weakness of trunk musculature - Plan: PT plan of care cert/re-cert  Abnormal posture - Plan: PT plan of care cert/re-cert  Joint stiffness of spine - Plan: PT plan of care cert/re-cert  Stiffness of hip joint, unspecified laterality - Plan: PT plan of care cert/re-cert  Difficulty in walking, not elsewhere classified - Plan: PT plan of care cert/re-cert      G-Codes - 23/53/61 1516    Functional Assessment Tool Used FOTO  47% limited   Functional Limitation Mobility: Walking and moving around   Mobility: Walking and Moving Around Current Status [phone]) At least 40 percent but less than 60 percent impaired, limited or restricted   Mobility: Walking and Moving Around Goal Status [phone]) At least 20 percent but less than 40 percent impaired, limited or restricted       Problem List Patient Active Problem List   Diagnosis Date Noted  . DOE (dyspnea on exertion) 08/13/2015  . 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 History of posttraumatic stress disorder (PTSD) 05/14/2015  . Obesity (BMI 30-39.9) 08/20/2012  . Family history of coronary artery disease 08/17/2012  . Palpitations   . OSA on CPAP   . Essential hypertension   . Dyslipidemia, goal LDL below 130   . Hypothyroid   . Lung cancer [facility])     [name]  PT 02/08/2016, 3:24 PM  [facility] [address] [place], [place], [address] Phone: [phone]   Fax:  [phone]  Name: Erin Myers MRN: [id number] Date of Birth: 06/09/1940

## 2016-02-08 NOTE — Patient Instructions (Signed)
Issued from cabinet posterior pelvic tilt  2-3x/day 10-15 reps hold 5-10 sec and written deep breathing 2 reps 5-8 x/day with feeling of tension in lower abdominals

## 2016-02-17 ENCOUNTER — Ambulatory Visit: Payer: PPO | Attending: Internal Medicine | Admitting: Physical Therapy

## 2016-02-17 DIAGNOSIS — M6281 Muscle weakness (generalized): Secondary | ICD-10-CM | POA: Insufficient documentation

## 2016-02-17 DIAGNOSIS — M25659 Stiffness of unspecified hip, not elsewhere classified: Secondary | ICD-10-CM | POA: Diagnosis not present

## 2016-02-17 DIAGNOSIS — M545 Low back pain, unspecified: Secondary | ICD-10-CM

## 2016-02-17 DIAGNOSIS — G8929 Other chronic pain: Secondary | ICD-10-CM | POA: Diagnosis not present

## 2016-02-17 DIAGNOSIS — R293 Abnormal posture: Secondary | ICD-10-CM | POA: Diagnosis not present

## 2016-02-17 DIAGNOSIS — M2560 Stiffness of unspecified joint, not elsewhere classified: Secondary | ICD-10-CM | POA: Insufficient documentation

## 2016-02-17 DIAGNOSIS — R262 Difficulty in walking, not elsewhere classified: Secondary | ICD-10-CM | POA: Diagnosis not present

## 2016-02-17 NOTE — Therapy (Signed)
[place], [place], [address] Phone: [phone]   Fax:  [phone]  Physical Therapy Treatment  Patient Details  Name: Erin Myers MRN: [id number] Date of Birth: February 23, 1941 Referring Provider: [name], MD  Encounter Date: 02/17/2016      PT End of Session - 02/17/16 1235    Visit Number 2   Number of Visits 12   Date for PT Re-Evaluation 03/21/16   Authorization Type [employer] M[employer]   PT Start Time 1230   PT Stop Time 3154   PT Time Calculation (min) 43 min      Past Medical History:  Diagnosis Date  . Anxiety   . Diabetes mellitus without complication ([place])   . Dyslipidemia   . HTN (hypertension)   . Hyperlipemia   . Hypothyroid   . Hypothyroidism   . Lung cancer ([place]) 2000   Carcinoid, s/p RLL lobectomy  . Obesity   . Ovarian cancer ([place])   . Palpitations 2009 and 2006   Negative nuclear stress test   . Sleep apnea    on C-pap    Past Surgical History:  Procedure Laterality Date  . ABDOMINAL HYSTERECTOMY  2001  . LOBECTOMY  2000   RLL  . NM MYOCAR PERF WALL MOTION  08/20/2007   EF 74%  EXERCISE CAPACITY 7 METS  . OOPHORECTOMY  2001  . TONSILLECTOMY      There were no vitals filed for this visit.      Subjective Assessment - 02/17/16 1234    Subjective It hurts when I come back up after bending over   Currently in Pain? No/denies                         [facility] Adult PT Treatment/Exercise - 02/17/16 0001      Lumbar Exercises: Stretches   Single Knee to Chest Stretch 3 reps;30 seconds   Lower Trunk Rotation 5 reps;10 seconds   Piriformis Stretch 30 seconds;3 reps     Lumbar Exercises: Supine   Glut Set Limitations pelvic tilt x 10 with breathing   Clam 20 reps   Clam Limitations with Pelvic tilt   Bent Knee Raise 20 reps   Bent Knee Raise Limitations with posterior pelvic tilt     Manual Therapy   Manual Therapy Joint  mobilization   Joint Mobilization long axis distraction and grade 3 A/P mobs followed by PROM hip flexion, ER, IR                 PT Education - 02/17/16 1319    Education provided Yes   Education Details HEP   Person(s) Educated Patient   Methods Explanation;Handout   Comprehension Verbalized understanding          PT Short Term Goals - 02/08/16 1511      PT SHORT TERM GOAL #1   Title Independent with inital HEP   Time 3   Period Weeks   Status New     PT SHORT TERM GOAL #2   Title She will report decr pain in back 30% or more   Time 3   Period Weeks   Status New     PT SHORT TERM GOAL #3   Title report decreased catching in RT hip with bending flexed trunk   Time 3   Period Weeks           PT Long Term Goals -  02/08/16 1512      PT LONG TERM GOAL #1   Title She will be independent with all hEP issued    Time 6   Period Weeks   Status New     PT LONG TERM GOAL #2   Title She will report pain decreased75% or more with standing and walking allowing for 30 min or more of activity on feet.    Time 6   Period Weeks   Status New     PT LONG TERM GOAL #3   Title She will be able to bend over without catch in RT hip   Time 6   Period Weeks   Status New               Plan - 02/17/16 1320    Clinical Impression Statement Established HEP for core/trunk and hip stretching. Pt feels most pull in lumbar with internal rotation stretch. Encourged gentle painfree stretching. Husband present for treatment and provides encouragement. A little discomfort in right low back upon standing after treatment.     PT Next Visit Plan Manual, for ROM hips and back , modalities as needed , core strength; body mechanics    PT Home Exercise Plan posterior pelvic tilt and deep breathing   Consulted and Agree with Plan of Care Patient      Patient will benefit from skilled therapeutic intervention in order to improve the following deficits and impairments:  Pain,  Postural dysfunction, Decreased strength, Decreased activity tolerance, Decreased range of motion, Difficulty walking, Increased muscle spasms  Visit Diagnosis: Chronic right-sided low back pain without sciatica  Weakness of trunk musculature  Abnormal posture  Joint stiffness of spine  Stiffness of hip joint, unspecified laterality  Difficulty in walking, not elsewhere classified     Problem List Patient Active Problem List   Diagnosis Date Noted  . DOE (dyspnea on exertion) 08/13/2015  . 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 History of posttraumatic stress disorder (PTSD) 05/14/2015  . Obesity (BMI 30-39.9) 08/20/2012  . Family history of coronary artery disease 08/17/2012  . Palpitations   . OSA on CPAP   . Essential hypertension   . Dyslipidemia, goal LDL below 130   . Hypothyroid   . Lung cancer [facility])     Dorene Ar , [place] 02/17/2016, 1:30 PM  [facility] [address] [place], [place], [address] Phone: [phone]   Fax:  [phone]  Name: Deeandra Jerry MRN: [id number] Date of Birth: 07-Jan-1941

## 2016-02-20 DIAGNOSIS — G4733 Obstructive sleep apnea (adult) (pediatric): Secondary | ICD-10-CM | POA: Diagnosis not present

## 2016-02-22 ENCOUNTER — Ambulatory Visit: Payer: PPO

## 2016-02-22 DIAGNOSIS — G8929 Other chronic pain: Secondary | ICD-10-CM

## 2016-02-22 DIAGNOSIS — M2560 Stiffness of unspecified joint, not elsewhere classified: Secondary | ICD-10-CM

## 2016-02-22 DIAGNOSIS — M6281 Muscle weakness (generalized): Secondary | ICD-10-CM

## 2016-02-22 DIAGNOSIS — M545 Low back pain, unspecified: Secondary | ICD-10-CM

## 2016-02-22 DIAGNOSIS — R293 Abnormal posture: Secondary | ICD-10-CM

## 2016-02-22 DIAGNOSIS — R262 Difficulty in walking, not elsewhere classified: Secondary | ICD-10-CM

## 2016-02-22 DIAGNOSIS — M25659 Stiffness of unspecified hip, not elsewhere classified: Secondary | ICD-10-CM

## 2016-02-22 NOTE — Therapy (Signed)
[place], [place], [address] Phone: [phone]   Fax:  [phone]  Physical Therapy Treatment  Patient Details  Name: Erin Myers MRN: [id number] Date of Birth: [birth year] Referring Provider: [name], MD  Encounter Date: 02/22/2016      PT End of Session - 02/22/16 1418    Visit Number 3   Number of Visits 12   Date for PT Re-Evaluation 03/21/16   Authorization Type [employer]   PT Start Time 0215   PT Stop Time 0315   PT Time Calculation (min) 60 min   Activity Tolerance Patient tolerated treatment well;No increased pain   Behavior During Therapy WFL for tasks assessed/performed      Past Medical History:  Diagnosis Date  . Anxiety   . Diabetes mellitus without complication ([place])   . Dyslipidemia   . HTN (hypertension)   . Hyperlipemia   . Hypothyroid   . Hypothyroidism   . 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 Obesity   . Ovarian cancer ([place])   . Palpitations 2009 and 2006   Negative nuclear stress test   . Sleep apnea    on C-pap    Past Surgical History:  Procedure Laterality Date  . ABDOMINAL HYSTERECTOMY  2001  . LOBECTOMY  2000   RLL  . NM MYOCAR PERF WALL MOTION  08/20/2007   EF 74%  EXERCISE CAPACITY 7 METS  . OOPHORECTOMY  2001  . TONSILLECTOMY      There were no vitals filed for this visit.      Subjective Assessment - 02/22/16 1423    Subjective She reports sore after  last visit with hip and used some ice and tylenol but no pain today, mild stiffness    Currently in Pain? No/denies                         [facility] Adult PT Treatment/Exercise - 02/22/16 0001      Lumbar Exercises: Stretches   Lower Trunk Rotation Limitations 15 reps 3 sec RT and LT   Pelvic Tilt Limitations 20reps 2 sec hold    Piriformis Stretch 2 reps;30 seconds  RT/LT     Lumbar Exercises: Aerobic   Stationary Bike  Nustep L4 UE andLE 6 min     Lumbar Exercises: Supine   Ab Set 10 reps   AB Set Limitations with arms off mat Pilates abdominal prep   Bent Knee Raise 10 reps  each leg  with PPT   Bent Knee Raise Limitations with posterior pelvic tilt     Lumbar Exercises: Sidelying   Clam 15 reps  RT     Manual Therapy   Joint Mobilization long axis distraction and grade 2 A/P mobs followed by PROM hip flexion, ER, IR  ease pressure as sore last visit                  PT Short Term Goals - 02/08/16 1511      PT SHORT TERM GOAL #1   Title Independent with inital HEP   Time 3   Period Weeks   Status New     PT SHORT TERM GOAL #2   Title She will report decr pain in back 30% or more   Time 3   Period Weeks   Status New     PT SHORT TERM GOAL #3   Title  report decreased catching in RT hip with bending flexed trunk   Time 3   Period Weeks           PT Long Term Goals - 02/08/16 1512      PT LONG TERM GOAL #1   Title She will be independent with all hEP issued    Time 6   Period Weeks   Status New     PT LONG TERM GOAL #2   Title She will report pain decreased75% or more with standing and walking allowing for 30 min or more of activity on feet.    Time 6   Period Weeks   Status New     PT LONG TERM GOAL #3   Title She will be able to bend over without catch in RT hip   Time 6   Period Weeks   Status New               Plan - 02/22/16 1504    Clinical Impression Statement Sore for last session andat end of this session she reproted soreness and would use heat at home. May have een sidely exercises this time so will assess next visit.    PT Treatment/Interventions Cryotherapy;Electrical Stimulation;Iontophoresis '4mg'$ /ml Dexamethasone;Ultrasound;Passive range of motion;Patient/family education;Manual techniques;Taping;Therapeutic exercise;Dry needling   PT Next Visit Plan Manual, for ROM hips and back , modalities as needed , core strength; body mechanics     PT Home Exercise Plan posterior pelvic tilt and deep breathing   Consulted and Agree with Plan of Care Patient      Patient will benefit from skilled therapeutic intervention in order to improve the following deficits and impairments:  Pain, Postural dysfunction, Decreased strength, Decreased activity tolerance, Decreased range of motion, Difficulty walking, Increased muscle spasms  Visit Diagnosis: Chronic right-sided low back pain without sciatica  Weakness of trunk musculature  Abnormal posture  Joint stiffness of spine  Stiffness of hip joint, unspecified laterality  Difficulty in walking, not elsewhere classified     Problem List Patient Active Problem List   Diagnosis Date Noted  . DOE (dyspnea on exertion) 08/13/2015  . 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 History of posttraumatic stress disorder (PTSD) 05/14/2015  . Obesity (BMI 30-39.9) 08/20/2012  . Family history of coronary artery disease 08/17/2012  . Palpitations   . OSA on CPAP   . Essential hypertension   . Dyslipidemia, goal LDL below 130   . Hypothyroid   . Lung cancer [facility])     [name]  PT 02/22/2016, 3:07 PM  [facility] [address] [place], [place], [address] Phone: [phone]   Fax:  [phone]  Name: Erin Myers MRN: [id number] Date of Birth: [birth year]

## 2016-02-24 ENCOUNTER — Encounter: Payer: PPO | Admitting: Physical Therapy

## 2016-02-29 ENCOUNTER — Ambulatory Visit: Payer: PPO

## 2016-03-03 ENCOUNTER — Ambulatory Visit: Payer: PPO

## 2016-03-03 DIAGNOSIS — M545 Low back pain: Secondary | ICD-10-CM | POA: Diagnosis not present

## 2016-03-03 DIAGNOSIS — G8929 Other chronic pain: Secondary | ICD-10-CM

## 2016-03-03 DIAGNOSIS — M6281 Muscle weakness (generalized): Secondary | ICD-10-CM

## 2016-03-03 DIAGNOSIS — R262 Difficulty in walking, not elsewhere classified: Secondary | ICD-10-CM

## 2016-03-03 DIAGNOSIS — M25659 Stiffness of unspecified hip, not elsewhere classified: Secondary | ICD-10-CM

## 2016-03-03 DIAGNOSIS — M2560 Stiffness of unspecified joint, not elsewhere classified: Secondary | ICD-10-CM

## 2016-03-03 DIAGNOSIS — R293 Abnormal posture: Secondary | ICD-10-CM

## 2016-03-03 NOTE — Therapy (Signed)
[place], [place], [address] Phone: [phone]   Fax:  [phone]  Physical Therapy Treatment  Patient Details  Name: Erin Myers MRN: [id number] Date of Birth: [birth year] Referring Provider: [name], MD  Encounter Date: 03/03/2016      PT End of Session - 03/03/16 1212    Visit Number 4   Number of Visits 12   Date for PT Re-Evaluation 03/21/16   Authorization Type [employer] team advantage [facility] [place] [facility]   PT Start Time 9833   PT Stop Time 1310   PT Time Calculation (min) 55 min   Activity Tolerance Patient tolerated treatment well;No increased pain   Behavior During Therapy WFL for tasks assessed/performed      Past Medical History:  Diagnosis Date  . Anxiety   . Diabetes mellitus without complication ([place])   . Dyslipidemia   . HTN (hypertension)   . Hyperlipemia   . Hypothyroid   . Hypothyroidism   . 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 Obesity   . Ovarian cancer ([place])   . Palpitations 2009 and 2006   Negative nuclear stress test   . Sleep apnea    on C-pap    Past Surgical History:  Procedure Laterality Date  . ABDOMINAL HYSTERECTOMY  2001  . LOBECTOMY  2000   RLL  . NM MYOCAR PERF WALL MOTION  08/20/2007   EF 74%  EXERCISE CAPACITY 7 METS  . OOPHORECTOMY  2001  . TONSILLECTOMY      There were no vitals filed for this visit.      Subjective Assessment - 03/03/16 1224    Subjective She reports incr pain with movement last week not when still but with movement.     Currently in Pain? Yes   Pain Location Back   Pain Orientation Lower;Right   Pain Descriptors / Indicators Spasm;Sore   Pain Type Chronic pain   Pain Radiating Towards RT buttock                         [facility] Adult PT Treatment/Exercise - 03/03/16 1214      Lumbar Exercises: Stretches   Single Knee to Chest Stretch 2 reps;30 seconds   Lower Trunk Rotation 5  reps;10 seconds  RT and LT    Pelvic Tilt Limitations 12 reps 10 sec    Piriformis Stretch 2 reps;30 seconds     Lumbar Exercises: Aerobic   Stationary Bike Nustep L4 UE andLE 6 min     Lumbar Exercises: Supine   Ab Set 10 reps   AB Set Limitations with arms off mat Pilates abdominal prep   Clam 20 reps   Bent Knee Raise 10 reps   Bent Knee Raise Limitations with posterior pelvic tilt     Lumbar Exercises: Sidelying   Clam 15 reps  RT      Manual Therapy   Manual therapy comments STW To RT parasinals and RT SI area. Most tender LS1 , SI area followed 3 sets of Lt sidebend stretch   Joint Mobilization long axis distraction and grade 2 A/P mobs followed by PROM hip flexion, ER, IR  ease pressure as sore last visit                PT Education - 03/03/16 1326    Education provided Yes   Education Details possibel benefits from injection   [employer]) Educated Patient  Methods Explanation   Comprehension Verbalized understanding          PT Short Term Goals - 03/03/16 1327      PT SHORT TERM GOAL #1   Title Independent with inital HEP   Status Achieved     PT SHORT TERM GOAL #2   Title She will report decr pain in back 30% or more   Baseline varies   Status On-going     PT SHORT TERM GOAL #3   Title report decreased catching in RT hip with bending flexed trunk   Baseline varies   Status On-going           PT Long Term Goals - 02/08/16 1512      PT LONG TERM GOAL #1   Title She will be independent with all hEP issued    Time 6   Period Weeks   Status New     PT LONG TERM GOAL #2   Title She will report pain decreased75% or more with standing and walking allowing for 30 min or more of activity on feet.    Time 6   Period Weeks   Status New     PT LONG TERM GOAL #3   Title She will be able to bend over without catch in RT hip   Time 6   Period Weeks   Status New               Plan - 03/03/16 1213    Clinical Impression Statement  She continues with spot areas of significant tnederness and soreness up llower RT lumbar paraspinals.   Discussed inflamatory issues and injection may benefit. Will see x 4-5 sessions and add [place] and return to MD if not improved   PT Treatment/Interventions Cryotherapy;Electrical Stimulation;Iontophoresis '4mg'$ /ml Dexamethasone;Ultrasound;Passive range of motion;Patient/family education;Manual techniques;Taping;Therapeutic exercise;Dry needling   PT Next Visit Plan Manual, for ROM hips and back , modalities as needed , core strength; body mechanics    PT Home Exercise Plan posterior pelvic tilt and deep breathing, Knne to chest and LTR stretch   Consulted and Agree with Plan of Care Patient      Patient will benefit from skilled therapeutic intervention in order to improve the following deficits and impairments:  Pain, Postural dysfunction, Decreased strength, Decreased activity tolerance, Decreased range of motion, Difficulty walking, Increased muscle spasms  Visit Diagnosis: Chronic right-sided low back pain without sciatica  Weakness of trunk musculature  Abnormal posture  Joint stiffness of spine  Stiffness of hip joint, unspecified laterality  Difficulty in walking, not elsewhere classified     Problem List Patient Active Problem List   Diagnosis Date Noted  . DOE (dyspnea on exertion) 08/13/2015  . 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 History of posttraumatic stress disorder (PTSD) 05/14/2015  . Obesity (BMI 30-39.9) 08/20/2012  . Family history of coronary artery disease 08/17/2012  . Palpitations   . OSA on CPAP   . Essential hypertension   . Dyslipidemia, goal LDL below 130   . Hypothyroid   . Lung cancer [facility])     Erin Myers  PT 03/03/2016, 1:28 PM  [facility] [address] [place], [place], [address] Phone: [phone]   Fax:  [phone]  Name: Erin Myers MRN: [id number] Date of  Birth: 10/10/1940

## 2016-03-03 NOTE — Patient Instructions (Signed)
eek

## 2016-03-09 ENCOUNTER — Ambulatory Visit: Payer: PPO | Admitting: Physical Therapy

## 2016-03-09 DIAGNOSIS — R262 Difficulty in walking, not elsewhere classified: Secondary | ICD-10-CM

## 2016-03-09 DIAGNOSIS — R293 Abnormal posture: Secondary | ICD-10-CM

## 2016-03-09 DIAGNOSIS — G8929 Other chronic pain: Secondary | ICD-10-CM

## 2016-03-09 DIAGNOSIS — M25659 Stiffness of unspecified hip, not elsewhere classified: Secondary | ICD-10-CM

## 2016-03-09 DIAGNOSIS — M6281 Muscle weakness (generalized): Secondary | ICD-10-CM

## 2016-03-09 DIAGNOSIS — M545 Low back pain: Secondary | ICD-10-CM | POA: Diagnosis not present

## 2016-03-09 DIAGNOSIS — M2560 Stiffness of unspecified joint, not elsewhere classified: Secondary | ICD-10-CM

## 2016-03-09 NOTE — Patient Instructions (Signed)
Hamstring: Towel Stretch (Supine)    Lie on back. Loop towel around left foot,. Straighten knee and pull foot toward body. Hold __30_ seconds. Relax. Repeat _3_ times. Do __1_ times a day. Repeat with other leg.    Copyright  VHI. All rights reserved.  Achilles / Gastroc, Standing    Stand, right foot behind, heel on floor and turned slightly out, leg straight, forward leg bent. Move hips forward. Hold __30_ seconds. Repeat _3__ times per session. Do __1_ sessions per day.  Copyright  VHI. All rights reserved.

## 2016-03-09 NOTE — Therapy (Signed)
[place], [place], [address] Phone: [phone]   Fax:  [phone]  Physical Therapy Treatment  Patient Details  Name: Erin Myers MRN: [id number] Date of Birth: [birth year] Referring Provider: [name], MD  Encounter Date: 03/09/2016      PT End of Session - 03/09/16 1517    Visit Number 5   Number of Visits 12   Date for PT Re-Evaluation 03/21/16   PT Start Time 2353   PT Stop Time 1500   PT Time Calculation (min) 45 min   Activity Tolerance Patient tolerated treatment well   Behavior During Therapy [facility] for tasks assessed/performed      Past Medical History:  Diagnosis Date  . Anxiety   . Diabetes mellitus without complication ([place])   . Dyslipidemia   . HTN (hypertension)   . Hyperlipemia   . Hypothyroid   . Hypothyroidism   . Lung cancer ([place]) 2000   Carcinoid, s/p RLL lobectomy  . Obesity   . Ovarian cancer ([place])   . Palpitations 2009 and 2006   Negative nuclear stress test   . Sleep apnea    on C-pap    Past Surgical History:  Procedure Laterality Date  . ABDOMINAL HYSTERECTOMY  2001  . LOBECTOMY  2000   RLL  . NM MYOCAR PERF WALL MOTION  08/20/2007   EF 74%  EXERCISE CAPACITY 7 METS  . OOPHORECTOMY  2001  . TONSILLECTOMY      There were no vitals filed for this visit.      Subjective Assessment - 03/09/16 1415    Subjective Pulling in right now.  Not painful.  Pain worse in the AM.    Currently in Pain? Yes   Pain Score --  mild   Pain Location Back   Pain Orientation Lower;Right   Pain Descriptors / Indicators Tightness;Sore   Pain Type Chronic pain   Pain Radiating Towards right buttock   Pain Onset More than a month ago   Aggravating Factors  bending , standing shopping,  worse first thing in the morning.    Pain Relieving Factors exercises   Multiple Pain Sites No                         [facility] Adult PT Treatment/Exercise -  03/09/16 0001      Lumbar Exercises: Stretches   Passive Hamstring Stretch 3 reps;30 seconds   Lower Trunk Rotation 5 reps;10 seconds   Lower Trunk Rotation Limitations more pulling noted right   Piriformis Stretch 2 reps;20 seconds  both     Lumbar Exercises: Aerobic   Stationary Bike Nustep L4 UE andLE 6 min     Lumbar Exercises: Supine   Ab Set 10 reps   Large Ball Abdominal Isometric 10 reps  both hands HEP   Large Ball Oblique Isometric 10 reps  each side, HEP     Ultrasound   Ultrasound Location gluteal   Ultrasound Parameters 1.5 watts/cm2   Ultrasound Goals Pain     Ankle Exercises: Stretches   Gastroc Stretch Limitations HEP, demonstrated for patient.                PT Education - 03/09/16 1511    Education provided Yes   Education Details HEP   Person(s) Educated Patient   Methods Explanation;Demonstration;Tactile cues;Verbal cues;Handout   Comprehension Verbalized understanding;Returned demonstration          PT Short  Term Goals - 03/09/16 1520      PT SHORT TERM GOAL #1   Title Independent with inital HEP   Time 3   Period Weeks   Status Achieved     PT SHORT TERM GOAL #2   Title She will report decr pain in back 30% or more   Baseline varies   Time 3   Period Weeks   Status On-going     PT SHORT TERM GOAL #3   Title report decreased catching in RT hip with bending flexed trunk   Baseline catching continues to be reported   Time 3   Period Weeks   Status On-going           PT Long Term Goals - 02/08/16 1512      PT LONG TERM GOAL #1   Title She will be independent with all hEP issued    Time 6   Period Weeks   Status New     PT LONG TERM GOAL #2   Title She will report pain decreased75% or more with standing and walking allowing for 30 min or more of activity on feet.    Time 6   Period Weeks   Status New     PT LONG TERM GOAL #3   Title She will be able to bend over without catch in RT hip   Time 6   Period Weeks    Status New               Plan - 03/09/16 1517    Clinical Impression Statement PT has helped some of her pain.  She is compliant with her HEP.  Progress toward her HEP goals.  Trial of [place] today.  Patient is less tender to palpation per patient. "I feel fine" at the end of session.   PT Next Visit Plan review exercises, continue to add exercises for HEP, assess [place],  Print and review body mechanics.    PT Home Exercise Plan posterior pelvic tilt and deep breathing, Knne to chest and LTR stretch  12/27 hamstring, gastroc stretch abdominal isometrics.   Consulted and Agree with Plan of Care Patient      Patient will benefit from skilled therapeutic intervention in order to improve the following deficits and impairments:  Pain, Postural dysfunction, Decreased strength, Decreased activity tolerance, Decreased range of motion, Difficulty walking, Increased muscle spasms  Visit Diagnosis: Chronic right-sided low back pain without sciatica  Weakness of trunk musculature  Abnormal posture  Joint stiffness of spine  Stiffness of hip joint, unspecified laterality  Difficulty in walking, not elsewhere classified     Problem List Patient Active Problem List   Diagnosis Date Noted  . DOE (dyspnea on exertion) 08/13/2015  . 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 History of posttraumatic stress disorder (PTSD) 05/14/2015  . Obesity (BMI 30-39.9) 08/20/2012  . Family history of coronary artery disease 08/17/2012  . Palpitations   . OSA on CPAP   . Essential hypertension   . Dyslipidemia, goal LDL below 130   . Hypothyroid   . Lung cancer ([place])     Emilyann Banka PTA 03/09/2016, 3:22 PM  [facility] [address] [place], [place], [address] Phone: [phone]   Fax:  [phone]  Name: Erin Myers MRN: [id number] Date of Birth: 1940/07/24

## 2016-03-10 ENCOUNTER — Ambulatory Visit: Payer: PPO | Admitting: Physical Therapy

## 2016-03-16 ENCOUNTER — Ambulatory Visit: Payer: PPO | Admitting: Physical Therapy

## 2016-03-21 ENCOUNTER — Ambulatory Visit: Payer: PPO | Attending: Internal Medicine

## 2016-03-21 DIAGNOSIS — M545 Low back pain: Secondary | ICD-10-CM | POA: Diagnosis not present

## 2016-03-21 DIAGNOSIS — R293 Abnormal posture: Secondary | ICD-10-CM | POA: Diagnosis not present

## 2016-03-21 DIAGNOSIS — M2560 Stiffness of unspecified joint, not elsewhere classified: Secondary | ICD-10-CM | POA: Diagnosis not present

## 2016-03-21 DIAGNOSIS — G8929 Other chronic pain: Secondary | ICD-10-CM | POA: Diagnosis not present

## 2016-03-21 DIAGNOSIS — M6281 Muscle weakness (generalized): Secondary | ICD-10-CM | POA: Insufficient documentation

## 2016-03-21 DIAGNOSIS — M25659 Stiffness of unspecified hip, not elsewhere classified: Secondary | ICD-10-CM | POA: Diagnosis not present

## 2016-03-21 DIAGNOSIS — R262 Difficulty in walking, not elsewhere classified: Secondary | ICD-10-CM | POA: Diagnosis not present

## 2016-03-21 NOTE — Therapy (Signed)
[place], [place], [address] Phone: [phone]   Fax:  [phone]  Physical Therapy Treatment  Patient Details  Name: Erin Myers MRN: [id number] Date of Birth: [birth year] Referring Provider: [name], MD  Encounter Date: 03/21/2016      PT End of Session - 03/21/16 1422    Visit Number 6   Number of Visits 12   Date for PT Re-Evaluation 04/12/16   Authorization Type [employer] team advantage [facility]   PT Start Time 0219   PT Stop Time 0300   PT Time Calculation (min) 41 min   Activity Tolerance Patient tolerated treatment well   Behavior During Therapy [facility] for tasks assessed/performed      Past Medical History:  Diagnosis Date  . Anxiety   . Diabetes mellitus without complication ([place])   . Dyslipidemia   . HTN (hypertension)   . Hyperlipemia   . Hypothyroid   . Hypothyroidism   . Lung cancer ([place]) 2000   Carcinoid, s/p RLL lobectomy  . Obesity   . Ovarian cancer ([place])   . Palpitations 2009 and 2006   Negative nuclear stress test   . Sleep apnea    on C-pap    Past Surgical History:  Procedure Laterality Date  . ABDOMINAL HYSTERECTOMY  2001  . LOBECTOMY  2000   RLL  . NM MYOCAR PERF WALL MOTION  08/20/2007   EF 74%  EXERCISE CAPACITY 7 METS  . OOPHORECTOMY  2001  . TONSILLECTOMY      There were no vitals filed for this visit.      Subjective Assessment - 03/21/16 1423    Subjective Less stiff but with removing [employer] decor pain increased   Currently in Pain? No/denies                         [facility] Adult PT Treatment/Exercise - 03/21/16 0001      Lumbar Exercises: Stretches   Passive Hamstring Stretch 2 reps;30 seconds   Single Knee to Chest Stretch 2 reps;30 seconds   Lower Trunk Rotation 5 reps;10 seconds   Pelvic Tilt Limitations 15 reps   Piriformis Stretch 2 reps;30 seconds     Lumbar Exercises: Aerobic   Stationary Bike Nustep L4 UE  andLE 6 min     Lumbar Exercises: Standing   Other Standing Lumbar Exercises standing stabilization rotation with red band and no trunk rotation in split stance.  15 RT/LT     Lumbar Exercises: Supine   Ab Set 15 reps   AB Set Limitations with arms off mat Pilates abdominal prep x 12   Clam 15 reps  RT   Bent Knee Raise 15 reps   Bent Knee Raise Limitations with posterior pelvic tilt                PT Education - 03/21/16 1510    Education provided Yes   Education Details Rotation stab standing with red band   Person(s) Educated Patient   Methods Explanation;Demonstration;Tactile cues;Verbal cues;Handout   Comprehension Returned demonstration;Verbalized understanding          PT Short Term Goals - 03/21/16 1513      PT SHORT TERM GOAL #1   Title Independent with inital HEP   Status Achieved     PT SHORT TERM GOAL #2   Title She will report decr pain in back 30% or more   Status Achieved  PT SHORT TERM GOAL #3   Title report decreased catching in RT hip with bending flexed trunk   Status Achieved           PT Long Term Goals - 03/21/16 1513      PT LONG TERM GOAL #1   Title She will be independent with all HEP issued    Status On-going     PT LONG TERM GOAL #2   Title She will report pain decreased75% or more with standing and walking allowing for 30 min or more of activity on feet.    Status On-going     PT LONG TERM GOAL #3   Title She will be able to bend over without catch in RT hip   Status On-going               Plan - 03/21/16 1430    Clinical Impression Statement Appears to be improving as she ahd incr pain this weekend but has resolved today.  slight soreness 1/10 RT lower back post session but she stated she would heat or ice at home if needed.   Doing well with exercise.    Rehab Potential Good   PT Frequency 2x / week   PT Duration 3 weeks   PT Treatment/Interventions Cryotherapy;Electrical Stimulation;Iontophoresis '4mg'$ /ml  Dexamethasone;Ultrasound;Passive range of motion;Patient/family education;Manual techniques;Taping;Therapeutic exercise;Dry needling   PT Next Visit Plan Continue stabilizaiton exercises, modalities if needed and STW   PT Home Exercise Plan posterior pelvic tilt and deep breathing, Knne to chest and LTR stretch  12/27 hamstring, gastroc stretch abdominal isometrics.  standing trunk rot stab with red band   Consulted and Agree with Plan of Care Patient      Patient will benefit from skilled therapeutic intervention in order to improve the following deficits and impairments:  Pain, Postural dysfunction, Decreased strength, Decreased activity tolerance, Decreased range of motion, Difficulty walking, Increased muscle spasms  Visit Diagnosis: Chronic right-sided low back pain without sciatica  Weakness of trunk musculature  Abnormal posture  Joint stiffness of spine  Stiffness of hip joint, unspecified laterality  Difficulty in walking, not elsewhere classified     Problem List Patient Active Problem List   Diagnosis Date Noted  . DOE (dyspnea on exertion) 08/13/2015  . 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 History of posttraumatic stress disorder (PTSD) 05/14/2015  . Obesity (BMI 30-39.9) 08/20/2012  . Family history of coronary artery disease 08/17/2012  . Palpitations   . OSA on CPAP   . Essential hypertension   . Dyslipidemia, goal LDL below 130   . Hypothyroid   . Lung cancer [facility])     [name]  PT 03/21/2016, 3:23 PM  [facility] [address] [place], [place], [address] Phone: [phone]   Fax:  [phone]  Name: Erin Myers MRN: [id number] Date of Birth: 1940/08/27

## 2016-03-21 NOTE — Patient Instructions (Signed)
Written instructions for standing red band (issued ) pulls for trunk rotation stabilization. 1-2xday  12-20 reps

## 2016-03-23 ENCOUNTER — Ambulatory Visit: Payer: PPO | Admitting: Physical Therapy

## 2016-03-23 DIAGNOSIS — M545 Low back pain, unspecified: Secondary | ICD-10-CM

## 2016-03-23 DIAGNOSIS — M6281 Muscle weakness (generalized): Secondary | ICD-10-CM

## 2016-03-23 DIAGNOSIS — R262 Difficulty in walking, not elsewhere classified: Secondary | ICD-10-CM

## 2016-03-23 DIAGNOSIS — M25659 Stiffness of unspecified hip, not elsewhere classified: Secondary | ICD-10-CM

## 2016-03-23 DIAGNOSIS — M2560 Stiffness of unspecified joint, not elsewhere classified: Secondary | ICD-10-CM

## 2016-03-23 DIAGNOSIS — R293 Abnormal posture: Secondary | ICD-10-CM

## 2016-03-23 DIAGNOSIS — G8929 Other chronic pain: Secondary | ICD-10-CM

## 2016-03-23 NOTE — Therapy (Signed)
[place], [place], [address] Phone: [phone]   Fax:  [phone]  Physical Therapy Treatment  Patient Details  Name: Erin Myers MRN: [id number] Date of Birth: 10-Mar-1941 Referring Provider: [name], MD  Encounter Date: 03/23/2016      PT End of Session - 03/23/16 1457    Visit Number 7   Number of Visits 12   Date for PT Re-Evaluation 04/12/16   PT Start Time 0254   PT Stop Time 2706   PT Time Calculation (min) 31 min   Activity Tolerance Patient tolerated treatment well   Behavior During Therapy [facility] for tasks assessed/performed      Past Medical History:  Diagnosis Date  . Anxiety   . Diabetes mellitus without complication ([place])   . Dyslipidemia   . HTN (hypertension)   . Hyperlipemia   . Hypothyroid   . Hypothyroidism   . Lung cancer ([place]) 2000   Carcinoid, s/p RLL lobectomy  . Obesity   . Ovarian cancer ([place])   . Palpitations 2009 and 2006   Negative nuclear stress test   . Sleep apnea    on C-pap    Past Surgical History:  Procedure Laterality Date  . ABDOMINAL HYSTERECTOMY  2001  . LOBECTOMY  2000   RLL  . NM MYOCAR PERF WALL MOTION  08/20/2007   EF 74%  EXERCISE CAPACITY 7 METS  . OOPHORECTOMY  2001  . TONSILLECTOMY      There were no vitals filed for this visit.      Subjective Assessment - 03/23/16 1451    Multiple Pain Sites No                         [facility] Adult PT Treatment/Exercise - 03/23/16 0001      Lumbar Exercises: Stretches   Passive Hamstring Stretch 3 reps;30 seconds  HEP   Passive Hamstring Stretch Limitations gastroc stretch 3 X 30 seconds each     Lumbar Exercises: Aerobic   Stationary Bike Nustep L4 UE andLE 6 min     Lumbar Exercises: Standing   Heel Raises Limitations 5 X left 10 x right HEP,  also added DF with band, 10 x each  tip toe walking with counter and chair for balance/strength   Other  Standing Lumbar Exercises Standing stabilization with trunk 10 X each side.  handle attachment made for patient for hand support.    Other Standing Lumbar Exercises avoid pounding the floor with walking.                PT Education - 03/23/16 1457    Education provided Yes   Education Details HEP    Person(s) Educated Patient   Methods Explanation;Demonstration;Tactile cues;Verbal cues;Handout   Comprehension Verbalized understanding;Returned demonstration          PT Short Term Goals - 03/21/16 1513      PT SHORT TERM GOAL #1   Title Independent with inital HEP   Status Achieved     PT SHORT TERM GOAL #2   Title She will report decr pain in back 30% or more   Status Achieved     PT SHORT TERM GOAL #3   Title report decreased catching in RT hip with bending flexed trunk   Status Achieved           PT Long Term Goals - 03/23/16 1501      PT LONG TERM  GOAL #1   Title She will be independent with all HEP issued    Time 6   Period Weeks   Status On-going     PT LONG TERM GOAL #2   Title She will report pain decreased75% or more with standing and walking allowing for 30 min or more of activity on feet.    Baseline 10-15 % decreased   Time 6   Period Weeks   Status On-going     PT LONG TERM GOAL #3   Title She will be able to bend over without catch in RT hip   Baseline catches most of the time, sometimes does not catch   Time 6   Period Weeks   Status On-going               Plan - 03/23/16 1458    Clinical Impression Statement Patient can tolerate being up on her feer 45 minutes.  She toldetates ahopping 20 minutes.  Her pain has improved 10-15 %.  Progress made toward these goals.   patient requested ankle strengthening exercises today to assist walking and balance.Able to progress her HEP.   PT Next Visit Plan Stabilization, chech new HEP    PT Home Exercise Plan posterior pelvic tilt and deep breathing, Knne to chest and LTR stretch  12/27  hamstring, gastroc stretch abdominal isometrics.  standing trunk rot stab with red band, ankle DF, red band, heel lift and walking on tip toes.    Consulted and Agree with Plan of Care Patient      Patient will benefit from skilled therapeutic intervention in order to improve the following deficits and impairments:  Pain, Postural dysfunction, Decreased strength, Decreased activity tolerance, Decreased range of motion, Difficulty walking, Increased muscle spasms  Visit Diagnosis: Chronic right-sided low back pain without sciatica  Weakness of trunk musculature  Abnormal posture  Joint stiffness of spine  Stiffness of hip joint, unspecified laterality  Difficulty in walking, not elsewhere classified     Problem List Patient Active Problem List   Diagnosis Date Noted  . DOE (dyspnea on exertion) 08/13/2015  . 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 History of posttraumatic stress disorder (PTSD) 05/14/2015  . Obesity (BMI 30-39.9) 08/20/2012  . Family history of coronary artery disease 08/17/2012  . Palpitations   . OSA on CPAP   . Essential hypertension   . Dyslipidemia, goal LDL below 130   . Hypothyroid   . Lung cancer ([place])     HARRIS,KAREN PTA 03/23/2016, 3:04 PM  [facility] [address] [place], [place], [address] Phone: [phone]   Fax:  [phone]  Name: Erin Myers MRN: [id number] Date of Birth: 23-Feb-1941

## 2016-03-23 NOTE — Patient Instructions (Signed)
From ex drawer: hamstring and gastroc stretch.  Daily 3 x each 30 seconds holds.   Ankle stretches DF with band 10 X daily Walking on tip toes until fatigue. Single leg heel lifts , daily, work up to 20 each.

## 2016-03-28 ENCOUNTER — Ambulatory Visit: Payer: PPO

## 2016-03-28 DIAGNOSIS — M6281 Muscle weakness (generalized): Secondary | ICD-10-CM

## 2016-03-28 DIAGNOSIS — M545 Low back pain: Principal | ICD-10-CM

## 2016-03-28 DIAGNOSIS — R293 Abnormal posture: Secondary | ICD-10-CM

## 2016-03-28 DIAGNOSIS — G8929 Other chronic pain: Secondary | ICD-10-CM

## 2016-03-28 DIAGNOSIS — M2560 Stiffness of unspecified joint, not elsewhere classified: Secondary | ICD-10-CM

## 2016-03-28 DIAGNOSIS — M25659 Stiffness of unspecified hip, not elsewhere classified: Secondary | ICD-10-CM

## 2016-03-28 NOTE — Therapy (Signed)
[place], [place], [address] Phone: [phone]   Fax:  [phone]  Physical Therapy Treatment  Patient Details  Name: Quetzally Callas MRN: [id number] Date of Birth: 12-Jun-1940 Referring Provider: [name], MD  Encounter Date: 03/28/2016      PT End of Session - 03/28/16 1409    Visit Number 8   Number of Visits 12   Date for PT Re-Evaluation 04/12/16   Authorization Type [employer] [facility]   PT Start Time 0210   PT Stop Time 0300   PT Time Calculation (min) 50 min   Activity Tolerance Patient tolerated treatment well   Behavior During Therapy [facility] for tasks assessed/performed      Past Medical History:  Diagnosis Date  . Anxiety   . Diabetes mellitus without complication ([place])   . Dyslipidemia   . HTN (hypertension)   . Hyperlipemia   . Hypothyroid   . Hypothyroidism   . Lung cancer ([place]) 2000   Carcinoid, s/p RLL lobectomy  . Obesity   . Ovarian cancer ([place])   . Palpitations 2009 and 2006   Negative nuclear stress test   . Sleep apnea    on C-pap    Past Surgical History:  Procedure Laterality Date  . ABDOMINAL HYSTERECTOMY  2001  . LOBECTOMY  2000   RLL  . NM MYOCAR PERF WALL MOTION  08/20/2007   EF 74%  EXERCISE CAPACITY 7 METS  . OOPHORECTOMY  2001  . TONSILLECTOMY      There were no vitals filed for this visit.      Subjective Assessment - 03/28/16 1422    Subjective Small catch  Rt side with walking,   Ok when still.    Currently in Pain? No/denies   Pain Score --  1/10 with walking                         [facility] Adult PT Treatment/Exercise - 03/28/16 0001      Lumbar Exercises: Stretches   Passive Hamstring Stretch 60 seconds;2 reps   Single Knee to Chest Stretch 2 reps;30 seconds   Lower Trunk Rotation 5 reps;10 seconds   Pelvic Tilt Limitations 15 reps   Piriformis Stretch 2 reps;30 seconds     Lumbar Exercises: Aerobic    Stationary Bike Nustep LE 6 min     Lumbar Exercises: Standing   Other Standing Lumbar Exercises Standing stabilization with trunk 10 X each side.  handle attachment made for patient for hand support.      Lumbar Exercises: Supine   Bent Knee Raise 15 reps  RT  /LT   Bent Knee Raise Limitations with posterior pelvic tilt   Bridge Limitations shoulder bridge x 15, with ball squeeze x15      Controlled stepping for weight shifting and smoothness with weight transfers for strength and balance forward and to the side              PT Short Term Goals - 03/21/16 1513      PT SHORT TERM GOAL #1   Title Independent with inital HEP   Status Achieved     PT SHORT TERM GOAL #2   Title She will report decr pain in back 30% or more   Status Achieved     PT SHORT TERM GOAL #3   Title report decreased catching in RT hip with bending flexed trunk   Status Achieved  PT Long Term Goals - 03/23/16 1501      PT LONG TERM GOAL #1   Title She will be independent with all HEP issued    Time 6   Period Weeks   Status On-going     PT LONG TERM GOAL #2   Title She will report pain decreased75% or more with standing and walking allowing for 30 min or more of activity on feet.    Baseline 10-15 % decreased   Time 6   Period Weeks   Status On-going     PT LONG TERM GOAL #3   Title She will be able to bend over without catch in RT hip   Baseline catches most of the time, sometimes does not catch   Time 6   Period Weeks   Status On-going               Plan - 03/28/16 1409    Clinical Impression Statement Doing better with less pain/catching RTR back . She wants to work on balance and smoothness in gait to decrease force to back with walking.  Controlled stepping should help this over tieme   PT Treatment/Interventions Cryotherapy;Electrical Stimulation;Iontophoresis '4mg'$ /ml Dexamethasone;Ultrasound;Passive range of motion;Patient/family education;Manual  techniques;Taping;Therapeutic exercise;Dry needling   PT Next Visit Plan Stabilization, Follow up with stepping  forward and sideway with control/    PT Home Exercise Plan posterior pelvic tilt and deep breathing, Knne to chest and LTR stretch  12/27 hamstring, gastroc stretch abdominal isometrics.  standing trunk rot stab with red band, ankle DF, red band, heel lift and walking on tip toes.    Consulted and Agree with Plan of Care Patient      Patient will benefit from skilled therapeutic intervention in order to improve the following deficits and impairments:  Pain, Postural dysfunction, Decreased strength, Decreased activity tolerance, Decreased range of motion, Difficulty walking, Increased muscle spasms  Visit Diagnosis: Chronic right-sided low back pain without sciatica  Weakness of trunk musculature  Abnormal posture  Joint stiffness of spine  Stiffness of hip joint, unspecified laterality     Problem List Patient Active Problem List   Diagnosis Date Noted  . DOE (dyspnea on exertion) 08/13/2015  . 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 History of posttraumatic stress disorder (PTSD) 05/14/2015  . Obesity (BMI 30-39.9) 08/20/2012  . Family history of coronary artery disease 08/17/2012  . Palpitations   . OSA on CPAP   . Essential hypertension   . Dyslipidemia, goal LDL below 130   . Hypothyroid   . Lung cancer [facility])     [name]  PT 03/28/2016, 3:12 PM  [facility] [address] [place], [place], [address] Phone: [phone]   Fax:  [phone]  Name: Marjoria Mancillas MRN: [id number] Date of Birth: 1940-03-29

## 2016-03-30 ENCOUNTER — Ambulatory Visit: Payer: PPO | Admitting: Physical Therapy

## 2016-04-01 DIAGNOSIS — L821 Other seborrheic keratosis: Secondary | ICD-10-CM | POA: Diagnosis not present

## 2016-04-01 DIAGNOSIS — L820 Inflamed seborrheic keratosis: Secondary | ICD-10-CM | POA: Diagnosis not present

## 2016-04-04 ENCOUNTER — Ambulatory Visit: Payer: PPO

## 2016-04-04 ENCOUNTER — Encounter: Payer: Self-pay | Admitting: Physical Therapy

## 2016-04-04 ENCOUNTER — Ambulatory Visit: Payer: PPO | Admitting: Physical Therapy

## 2016-04-04 DIAGNOSIS — M2560 Stiffness of unspecified joint, not elsewhere classified: Secondary | ICD-10-CM

## 2016-04-04 DIAGNOSIS — G8929 Other chronic pain: Secondary | ICD-10-CM

## 2016-04-04 DIAGNOSIS — M545 Low back pain: Principal | ICD-10-CM

## 2016-04-04 DIAGNOSIS — R262 Difficulty in walking, not elsewhere classified: Secondary | ICD-10-CM

## 2016-04-04 DIAGNOSIS — R293 Abnormal posture: Secondary | ICD-10-CM

## 2016-04-04 DIAGNOSIS — M25659 Stiffness of unspecified hip, not elsewhere classified: Secondary | ICD-10-CM

## 2016-04-04 DIAGNOSIS — M6281 Muscle weakness (generalized): Secondary | ICD-10-CM

## 2016-04-04 NOTE — Therapy (Addendum)
[place], [place], [address] Phone: [phone]   Fax:  [phone]  Physical Therapy Treatment/ Discharge  Patient Details  Name: Erin Myers MRN: [id number] Date of Birth: 08-09-1940 Referring Provider: [name], MD  Encounter Date: 04/04/2016      PT End of Session - 04/04/16 1736    Visit Number 9   Number of Visits 12   Date for PT Re-Evaluation 04/12/16   PT Start Time 1506   PT Stop Time 1545   PT Time Calculation (min) 39 min   Activity Tolerance Patient tolerated treatment well   Behavior During Therapy [facility] for tasks assessed/performed      Past Medical History:  Diagnosis Date  . Anxiety   . Diabetes mellitus without complication ([place])   . Dyslipidemia   . HTN (hypertension)   . Hyperlipemia   . Hypothyroid   . Hypothyroidism   . Lung cancer ([place]) 2000   Carcinoid, s/p RLL lobectomy  . Obesity   . Ovarian cancer ([place])   . Palpitations 2009 and 2006   Negative nuclear stress test   . Sleep apnea    on C-pap    Past Surgical History:  Procedure Laterality Date  . ABDOMINAL HYSTERECTOMY  2001  . LOBECTOMY  2000   RLL  . NM MYOCAR PERF WALL MOTION  08/20/2007   EF 74%  EXERCISE CAPACITY 7 METS  . OOPHORECTOMY  2001  . TONSILLECTOMY      There were no vitals filed for this visit.      Subjective Assessment - 04/04/16 1512    Subjective deep cleaned her bathroom and feels sore from it.  i cleaned the master bathroom.   Currently in Pain? Yes   Pain Score --  mild uo to 6/10   Pain Location Back   Pain Orientation Lower;Right   Pain Descriptors / Indicators Aching;Shooting   Pain Type Chronic pain   Pain Radiating Towards right buttock,  right shin   Aggravating Factors  bending to clean the bathroom,, got down on the floor   Pain Relieving Factors moving some   Multiple Pain Sites No                         [facility] Adult PT  Treatment/Exercise - 04/04/16 0001      Lumbar Exercises: Stretches   Passive Hamstring Stretch 3 reps;30 seconds  both   Double Knee to Chest Stretch --  10 X on BALL   Lower Trunk Rotation --  10 x legs on ball   Pelvic Tilt Limitations 15     Lumbar Exercises: Supine   Bridge 10 reps   Bridge Limitations legs on ball     Knee/Hip Exercises: Clinical research associate 3 reps;30 seconds  knclinr board, toes     Knee/Hip Exercises: Standing   Hip Extension 5 reps  alternating at [employer] Step Up Both;1 set;10 reps;Hand Hold: 2;Step Height: 4";Limitations   Forward Step Up Limitations Cues initially   Wall Squat 5 reps   Wall Squat Limitations small motions, ciues initially     Knee/Hip Exercises: Supine   Short Arc Quad Sets 10 reps;2 sets  5,9 LBS each                  PT Short Term Goals - 03/21/16 1513      PT SHORT TERM GOAL #1   Title  Independent with inital HEP   Status Achieved     PT SHORT TERM GOAL #2   Title She will report decr pain in back 30% or more   Status Achieved     PT SHORT TERM GOAL #3   Title report decreased catching in RT hip with bending flexed trunk   Status Achieved           PT Long Term Goals - 04/04/16 1739      PT LONG TERM GOAL #1   Title She will be independent with all HEP issued    Time 6   Period Weeks   Status On-going     PT LONG TERM GOAL #2   Title She will report pain decreased75% or more with standing and walking allowing for 30 min or more of activity on feet.    Time 6   Period Weeks   Status Unable to assess     PT LONG TERM GOAL #3   Title She will be able to bend over without catch in RT hip   Baseline less cathing   Time 6   Period Weeks   Status On-going               Plan - 04/04/16 1736    Clinical Impression Statement Less pain in shin at end of session,  Soreness flared due to her deep cleaning her bathroom.  Patient able to tolerate closed chain strengthening   today for legs which should assist with her goals to improve her gait.    PT Next Visit Plan Stabilization, Follow up with stepping  forward and sideway with control/ And gait leg strengthening,   FOTO?   PT Home Exercise Plan posterior pelvic tilt and deep breathing, Knne to chest and LTR stretch  12/27 hamstring, gastroc stretch abdominal isometrics.  standing trunk rot stab with red band, ankle DF, red band, heel lift and walking on tip toes.    Consulted and Agree with Plan of Care Patient      Patient will benefit from skilled therapeutic intervention in order to improve the following deficits and impairments:  Pain, Postural dysfunction, Decreased strength, Decreased activity tolerance, Decreased range of motion, Difficulty walking, Increased muscle spasms  Visit Diagnosis: Chronic right-sided low back pain without sciatica  Weakness of trunk musculature  Abnormal posture  Joint stiffness of spine  Stiffness of hip joint, unspecified laterality  Difficulty in walking, not elsewhere classified     Problem List Patient Active Problem List   Diagnosis Date Noted  . DOE (dyspnea on exertion) 08/13/2015  . 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 History of posttraumatic stress disorder (PTSD) 05/14/2015  . Obesity (BMI 30-39.9) 08/20/2012  . Family history of coronary artery disease 08/17/2012  . Palpitations   . OSA on CPAP   . Essential hypertension   . Dyslipidemia, goal LDL below 130   . Hypothyroid   . Lung cancer ([place])     [name] PTA 04/04/2016, 5:41 PM  [facility] [address] [place], [place], [address] Phone: [phone]   Fax:  [phone]  Name: Erin Myers MRN: [id number] Date of Birth: [birth year]  PHYSICAL THERAPY DISCHARGE SUMMARY  Visits from Start of Care:   Current functional level related to goals / functional outcomes: See above   Remaining deficits: Unknown as she  did not return after this visit   Education / Equipment: HEP  Plan:  Patient goals were partially met. Patient is being discharged due to not returning since the last visit.  ?????    [name]   PT  05/17/16    2:10 AM

## 2016-04-15 DIAGNOSIS — Z6836 Body mass index (BMI) 36.0-36.9, adult: Secondary | ICD-10-CM | POA: Diagnosis not present

## 2016-04-15 DIAGNOSIS — R7309 Other abnormal glucose: Secondary | ICD-10-CM | POA: Diagnosis not present

## 2016-04-15 DIAGNOSIS — G4733 Obstructive sleep apnea (adult) (pediatric): Secondary | ICD-10-CM | POA: Diagnosis not present

## 2016-04-15 DIAGNOSIS — I10 Essential (primary) hypertension: Secondary | ICD-10-CM | POA: Diagnosis not present

## 2016-04-15 DIAGNOSIS — Z1389 Encounter for screening for other disorder: Secondary | ICD-10-CM | POA: Diagnosis not present

## 2016-04-15 DIAGNOSIS — E784 Other hyperlipidemia: Secondary | ICD-10-CM | POA: Diagnosis not present

## 2016-04-15 DIAGNOSIS — M545 Low back pain: Secondary | ICD-10-CM | POA: Diagnosis not present

## 2016-04-26 DIAGNOSIS — G4733 Obstructive sleep apnea (adult) (pediatric): Secondary | ICD-10-CM | POA: Diagnosis not present

## 2016-05-05 DIAGNOSIS — Z6836 Body mass index (BMI) 36.0-36.9, adult: Secondary | ICD-10-CM | POA: Diagnosis not present

## 2016-05-05 DIAGNOSIS — I10 Essential (primary) hypertension: Secondary | ICD-10-CM | POA: Diagnosis not present

## 2016-05-13 DIAGNOSIS — I10 Essential (primary) hypertension: Secondary | ICD-10-CM | POA: Diagnosis not present

## 2016-05-16 ENCOUNTER — Telehealth: Payer: Self-pay | Admitting: Neurology

## 2016-05-16 NOTE — Telephone Encounter (Signed)
pls call patient back: She may have a bout of vertigo unrelated to CPAP usage. I would recommend, she see PCP and consider seeing ENT if PCP recommends it too.  Don't see that she had a FU appt since 1/17. Please assist in making FU for OSA with NP.

## 2016-05-16 NOTE — Telephone Encounter (Signed)
Pt called said she is experiencing vertigo for about 1 week. Says she is waking up with the ears feeling clogged up. She is wanting to know if the CPAP could have something to do with these problems.

## 2016-05-19 NOTE — Telephone Encounter (Signed)
I spoke to patient and she is aware of information below. She has an appt with ENT next week. I tried to make her a f/u appt but she declined at this time.

## 2016-05-24 DIAGNOSIS — H8112 Benign paroxysmal vertigo, left ear: Secondary | ICD-10-CM | POA: Diagnosis not present

## 2016-06-28 DIAGNOSIS — G4733 Obstructive sleep apnea (adult) (pediatric): Secondary | ICD-10-CM | POA: Diagnosis not present

## 2016-08-18 DIAGNOSIS — M722 Plantar fascial fibromatosis: Secondary | ICD-10-CM | POA: Diagnosis not present

## 2016-08-18 DIAGNOSIS — E038 Other specified hypothyroidism: Secondary | ICD-10-CM | POA: Diagnosis not present

## 2016-08-18 DIAGNOSIS — R7309 Other abnormal glucose: Secondary | ICD-10-CM | POA: Diagnosis not present

## 2016-08-18 DIAGNOSIS — Z6836 Body mass index (BMI) 36.0-36.9, adult: Secondary | ICD-10-CM | POA: Diagnosis not present

## 2016-08-18 DIAGNOSIS — G4733 Obstructive sleep apnea (adult) (pediatric): Secondary | ICD-10-CM | POA: Diagnosis not present

## 2016-08-18 DIAGNOSIS — I10 Essential (primary) hypertension: Secondary | ICD-10-CM | POA: Diagnosis not present

## 2016-08-18 DIAGNOSIS — E784 Other hyperlipidemia: Secondary | ICD-10-CM | POA: Diagnosis not present

## 2016-10-07 DIAGNOSIS — G4733 Obstructive sleep apnea (adult) (pediatric): Secondary | ICD-10-CM | POA: Diagnosis not present

## 2016-10-17 DIAGNOSIS — M533 Sacrococcygeal disorders, not elsewhere classified: Secondary | ICD-10-CM | POA: Diagnosis not present

## 2016-10-17 DIAGNOSIS — M5441 Lumbago with sciatica, right side: Secondary | ICD-10-CM | POA: Diagnosis not present

## 2016-10-17 DIAGNOSIS — G8929 Other chronic pain: Secondary | ICD-10-CM | POA: Diagnosis not present

## 2016-10-24 ENCOUNTER — Other Ambulatory Visit: Payer: Self-pay | Admitting: Orthopedic Surgery

## 2016-10-24 DIAGNOSIS — M533 Sacrococcygeal disorders, not elsewhere classified: Secondary | ICD-10-CM

## 2016-11-03 ENCOUNTER — Ambulatory Visit
Admission: RE | Admit: 2016-11-03 | Discharge: 2016-11-03 | Disposition: A | Discharge status: Home / Self Care | Payer: PPO | Source: Ambulatory Visit | Attending: Orthopedic Surgery | Admitting: Orthopedic Surgery

## 2016-11-03 DIAGNOSIS — M533 Sacrococcygeal disorders, not elsewhere classified: Secondary | ICD-10-CM

## 2016-11-03 MED ORDER — METHYLPREDNISOLONE ACETATE 40 MG/ML INJ SUSP (RADIOLOG: 120.0000 mg | Freq: Once | INTRAMUSCULAR | Status: DC

## 2016-11-07 DIAGNOSIS — G4733 Obstructive sleep apnea (adult) (pediatric): Secondary | ICD-10-CM | POA: Diagnosis not present

## 2016-11-07 DIAGNOSIS — H0289 Other specified disorders of eyelid: Secondary | ICD-10-CM | POA: Diagnosis not present

## 2016-11-07 DIAGNOSIS — H5203 Hypermetropia, bilateral: Secondary | ICD-10-CM | POA: Diagnosis not present

## 2016-11-07 DIAGNOSIS — H524 Presbyopia: Secondary | ICD-10-CM | POA: Diagnosis not present

## 2016-11-07 DIAGNOSIS — H52223 Regular astigmatism, bilateral: Secondary | ICD-10-CM | POA: Diagnosis not present

## 2016-11-07 DIAGNOSIS — H2513 Age-related nuclear cataract, bilateral: Secondary | ICD-10-CM | POA: Diagnosis not present

## 2016-12-06 DIAGNOSIS — Z23 Encounter for immunization: Secondary | ICD-10-CM | POA: Diagnosis not present

## 2016-12-06 DIAGNOSIS — Z6836 Body mass index (BMI) 36.0-36.9, adult: Secondary | ICD-10-CM | POA: Diagnosis not present

## 2016-12-06 DIAGNOSIS — E038 Other specified hypothyroidism: Secondary | ICD-10-CM | POA: Diagnosis not present

## 2016-12-06 DIAGNOSIS — I10 Essential (primary) hypertension: Secondary | ICD-10-CM | POA: Diagnosis not present

## 2016-12-06 DIAGNOSIS — E1151 Type 2 diabetes mellitus with diabetic peripheral angiopathy without gangrene: Secondary | ICD-10-CM | POA: Diagnosis not present

## 2016-12-06 DIAGNOSIS — G4733 Obstructive sleep apnea (adult) (pediatric): Secondary | ICD-10-CM | POA: Diagnosis not present

## 2016-12-06 DIAGNOSIS — E784 Other hyperlipidemia: Secondary | ICD-10-CM | POA: Diagnosis not present

## 2017-03-21 DIAGNOSIS — M533 Sacrococcygeal disorders, not elsewhere classified: Secondary | ICD-10-CM | POA: Diagnosis not present

## 2017-03-30 DIAGNOSIS — I10 Essential (primary) hypertension: Secondary | ICD-10-CM | POA: Diagnosis not present

## 2017-03-30 DIAGNOSIS — E1151 Type 2 diabetes mellitus with diabetic peripheral angiopathy without gangrene: Secondary | ICD-10-CM | POA: Diagnosis not present

## 2017-03-30 DIAGNOSIS — E038 Other specified hypothyroidism: Secondary | ICD-10-CM | POA: Diagnosis not present

## 2017-03-30 DIAGNOSIS — E7849 Other hyperlipidemia: Secondary | ICD-10-CM | POA: Diagnosis not present

## 2017-04-04 DIAGNOSIS — R2689 Other abnormalities of gait and mobility: Secondary | ICD-10-CM | POA: Diagnosis not present

## 2017-04-04 DIAGNOSIS — M545 Low back pain: Secondary | ICD-10-CM | POA: Diagnosis not present

## 2017-04-04 DIAGNOSIS — M79604 Pain in right leg: Secondary | ICD-10-CM | POA: Diagnosis not present

## 2017-04-06 DIAGNOSIS — G4733 Obstructive sleep apnea (adult) (pediatric): Secondary | ICD-10-CM | POA: Diagnosis not present

## 2017-04-06 DIAGNOSIS — E7849 Other hyperlipidemia: Secondary | ICD-10-CM | POA: Diagnosis not present

## 2017-04-06 DIAGNOSIS — Z6837 Body mass index (BMI) 37.0-37.9, adult: Secondary | ICD-10-CM | POA: Diagnosis not present

## 2017-04-06 DIAGNOSIS — E038 Other specified hypothyroidism: Secondary | ICD-10-CM | POA: Diagnosis not present

## 2017-04-06 DIAGNOSIS — E1151 Type 2 diabetes mellitus with diabetic peripheral angiopathy without gangrene: Secondary | ICD-10-CM | POA: Diagnosis not present

## 2017-04-06 DIAGNOSIS — Z0000 Encounter for general adult medical examination without abnormal findings: Secondary | ICD-10-CM | POA: Diagnosis not present

## 2017-04-06 DIAGNOSIS — R82998 Other abnormal findings in urine: Secondary | ICD-10-CM | POA: Diagnosis not present

## 2017-04-06 DIAGNOSIS — R05 Cough: Secondary | ICD-10-CM | POA: Diagnosis not present

## 2017-04-06 DIAGNOSIS — E668 Other obesity: Secondary | ICD-10-CM | POA: Diagnosis not present

## 2017-04-06 DIAGNOSIS — M545 Low back pain: Secondary | ICD-10-CM | POA: Diagnosis not present

## 2017-04-06 DIAGNOSIS — Z1389 Encounter for screening for other disorder: Secondary | ICD-10-CM | POA: Diagnosis not present

## 2017-04-06 DIAGNOSIS — I10 Essential (primary) hypertension: Secondary | ICD-10-CM | POA: Diagnosis not present

## 2017-04-07 ENCOUNTER — Other Ambulatory Visit (HOSPITAL_COMMUNITY): Payer: Self-pay | Admitting: Respiratory Therapy

## 2017-04-07 DIAGNOSIS — R2689 Other abnormalities of gait and mobility: Secondary | ICD-10-CM | POA: Diagnosis not present

## 2017-04-07 DIAGNOSIS — M545 Low back pain: Secondary | ICD-10-CM | POA: Diagnosis not present

## 2017-04-07 DIAGNOSIS — R05 Cough: Secondary | ICD-10-CM

## 2017-04-07 DIAGNOSIS — M79604 Pain in right leg: Secondary | ICD-10-CM | POA: Diagnosis not present

## 2017-04-07 DIAGNOSIS — R059 Cough, unspecified: Secondary | ICD-10-CM

## 2017-04-17 DIAGNOSIS — Z1212 Encounter for screening for malignant neoplasm of rectum: Secondary | ICD-10-CM | POA: Diagnosis not present

## 2017-04-24 DIAGNOSIS — I10 Essential (primary) hypertension: Secondary | ICD-10-CM | POA: Diagnosis not present

## 2017-04-24 DIAGNOSIS — R05 Cough: Secondary | ICD-10-CM | POA: Diagnosis not present

## 2017-04-24 DIAGNOSIS — Z6837 Body mass index (BMI) 37.0-37.9, adult: Secondary | ICD-10-CM | POA: Diagnosis not present

## 2017-04-24 DIAGNOSIS — R0602 Shortness of breath: Secondary | ICD-10-CM | POA: Diagnosis not present

## 2017-05-01 ENCOUNTER — Other Ambulatory Visit: Payer: Self-pay | Admitting: Internal Medicine

## 2017-05-01 DIAGNOSIS — R9389 Abnormal findings on diagnostic imaging of other specified body structures: Secondary | ICD-10-CM

## 2017-05-01 DIAGNOSIS — R0602 Shortness of breath: Secondary | ICD-10-CM

## 2017-05-01 DIAGNOSIS — R590 Localized enlarged lymph nodes: Secondary | ICD-10-CM

## 2017-05-02 ENCOUNTER — Encounter: Payer: Self-pay | Admitting: Pulmonary Disease

## 2017-05-02 ENCOUNTER — Ambulatory Visit: Payer: PPO | Admitting: Pulmonary Disease

## 2017-05-02 ENCOUNTER — Other Ambulatory Visit (INDEPENDENT_AMBULATORY_CARE_PROVIDER_SITE_OTHER): Payer: PPO

## 2017-05-02 ENCOUNTER — Ambulatory Visit (INDEPENDENT_AMBULATORY_CARE_PROVIDER_SITE_OTHER)
Admission: RE | Admit: 2017-05-02 | Discharge: 2017-05-02 | Disposition: A | Discharge status: Home / Self Care | Payer: PPO | Source: Ambulatory Visit | Attending: Pulmonary Disease | Admitting: Pulmonary Disease

## 2017-05-02 VITALS — BP 134/80 | HR 61 | Temp 97.9°F | Ht 65.0 in | Wt 217.0 lb

## 2017-05-02 DIAGNOSIS — G4733 Obstructive sleep apnea (adult) (pediatric): Secondary | ICD-10-CM | POA: Diagnosis not present

## 2017-05-02 DIAGNOSIS — E039 Hypothyroidism, unspecified: Secondary | ICD-10-CM

## 2017-05-02 DIAGNOSIS — R002 Palpitations: Secondary | ICD-10-CM

## 2017-05-02 DIAGNOSIS — E669 Obesity, unspecified: Secondary | ICD-10-CM | POA: Diagnosis not present

## 2017-05-02 DIAGNOSIS — R0600 Dyspnea, unspecified: Secondary | ICD-10-CM

## 2017-05-02 DIAGNOSIS — I10 Essential (primary) hypertension: Secondary | ICD-10-CM | POA: Diagnosis not present

## 2017-05-02 DIAGNOSIS — Z859 Personal history of malignant neoplasm, unspecified: Secondary | ICD-10-CM

## 2017-05-02 DIAGNOSIS — Z8659 Personal history of other mental and behavioral disorders: Secondary | ICD-10-CM

## 2017-05-02 DIAGNOSIS — Z9989 Dependence on other enabling machines and devices: Secondary | ICD-10-CM | POA: Diagnosis not present

## 2017-05-02 DIAGNOSIS — R05 Cough: Secondary | ICD-10-CM | POA: Diagnosis not present

## 2017-05-02 DIAGNOSIS — R0602 Shortness of breath: Secondary | ICD-10-CM | POA: Diagnosis not present

## 2017-05-02 LAB — BASIC METABOLIC PANEL
BUN: 13 mg/dL (ref 6–23)
CHLORIDE: 100 meq/L (ref 96–112)
CO2: 35 meq/L — AB (ref 19–32)
CREATININE: 0.81 mg/dL (ref 0.40–1.20)
Calcium: 9.6 mg/dL (ref 8.4–10.5)
GFR: 73.39 mL/min (ref >60.00)
GLUCOSE: 106 mg/dL — AB (ref 70–99)
Potassium: 4.1 mEq/L (ref 3.5–5.1)
Sodium: 141 mEq/L (ref 135–145)

## 2017-05-02 LAB — CBC WITH DIFFERENTIAL/PLATELET
BASOS PCT: 1 % (ref 0.0–3.0)
Basophils Absolute: 0.1 10*3/uL (ref 0.0–0.1)
EOS PCT: 1.4 % (ref 0.0–5.0)
Eosinophils Absolute: 0.2 10*3/uL (ref 0.0–0.7)
HCT: 51.2 % — ABNORMAL HIGH (ref 36.0–46.0)
Hemoglobin: 17.1 g/dL — ABNORMAL HIGH (ref 12.0–15.0)
LYMPHS ABS: 3.2 10*3/uL (ref 0.7–4.0)
Lymphocytes Relative: 28.7 % (ref 12.0–46.0)
MCHC: 33.5 g/dL (ref 30.0–36.0)
MCV: 91.8 fl (ref 78.0–100.0)
MONO ABS: 0.7 10*3/uL (ref 0.1–1.0)
Monocytes Relative: 6.5 % (ref 3.0–12.0)
NEUTROS PCT: 62.4 % (ref 43.0–77.0)
Neutro Abs: 7.1 10*3/uL (ref 1.4–7.7)
Platelets: 299 10*3/uL (ref 150.0–400.0)
RBC: 5.57 Mil/uL — ABNORMAL HIGH (ref 3.87–5.11)
RDW: 14.3 % (ref 11.5–15.5)
WBC: 11.3 10*3/uL — ABNORMAL HIGH (ref 4.0–10.5)

## 2017-05-02 LAB — COMPREHENSIVE METABOLIC PANEL
ALK PHOS: 61 U/L (ref 39–117)
ALT: 10 U/L (ref 0–35)
AST: 14 U/L (ref 0–37)
Albumin: 3.9 g/dL (ref 3.5–5.2)
BUN: 13 mg/dL (ref 6–23)
CALCIUM: 9.6 mg/dL (ref 8.4–10.5)
CO2: 35 meq/L — AB (ref 19–32)
Chloride: 100 mEq/L (ref 96–112)
Creatinine, Ser: 0.81 mg/dL (ref 0.40–1.20)
GFR: 73.39 mL/min (ref >60.00)
GLUCOSE: 106 mg/dL — AB (ref 70–99)
POTASSIUM: 4.1 meq/L (ref 3.5–5.1)
Sodium: 141 mEq/L (ref 135–145)
TOTAL PROTEIN: 7.3 g/dL (ref 6.0–8.3)
Total Bilirubin: 0.8 mg/dL (ref 0.2–1.2)

## 2017-05-02 LAB — SEDIMENTATION RATE: Sed Rate: 13 mm/hr (ref 0–30)

## 2017-05-02 LAB — TSH: TSH: 0.49 u[IU]/mL (ref 0.35–4.50)

## 2017-05-02 NOTE — Progress Notes (Addendum)
Subjective:     Patient ID: Erin Myers, female   DOB: Dec 21, 1940, 77 y.o.   MRN: [id number]  HPI  ~  May 14, 2015:  Initial pulmonary consult by SN>   36 y/o WF referred by DrAthar- Neurology,  for a pulmonary evaluation due to dyspnea; her PCP is Dr. Jeanine Luz relates a hx of SOB dating back ~33yr; it appears to be quite variable- sometimes occuring w/ exertion & other times when she is just resting; It is described as a feeling of not being able to get a deep breath, can't get the air "IN", and not satisfied breathing "I can't get oxygen"; she had a URI in [id number] assoc w/ cough which has mostly resolved but her intermittent SOB is unchanged; she also has a discomfort in her upper chest; she notes that DrPaterson has done CXRs and Spirometry in the past- she was told they were OK; she has been under a lot of stress- husb ill, out of work, PTSD...  Smoking Hx>  She is an ex-smoker, starting at 54, smoked for 1yrs up to 1ppd, quit in 1993 when she was around grandchildren...  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  Medical Hx>  Hx HBP, palpitations, HL, DM (diet controlled), Obesity, Hypothyroid, colon polyps/ divertics/ hems, Ovarian Ca, Anxiety & PTSD... She has prev seen DrLittle for the palpit (now DrHarding).  Family Hx>  Neg for hx respiratory problems; pos for heart disease & stroke...  Occup Hx>  Former Advertising copywriter; No known exposures to asbestos, silica dust, other inorganic or organic dusts, etc...  Current Meds>  ASA81, ToprolXL50-3/d, Losar50, Crestor10, Synthroid112, Tranxene7.5- taking 1/2Bid...  EXAM shows Afeb, VSS, O2sat=97% on RA; wt=215#,  5'5"Tall, BMI=36;  HEENT- neg, mallampati2;  Chest- clear w/o w/r/r;  Heart- RR w/o m/r/g;  Abd- soft, nontender, neg;  Ext- neg w/o c/c/e;  Neuro- intact w/o focal abn...  CXR 05/14/15>  Norm heart size, post surg changes on right, clear lungs, NAD...  Spirometry 05/14/15>  FVC=2.42 (82%), FEV1=1.86 (83%), %1sec=77%, mid-flows are wnl at 84% predicted... This is a normal spirometry w/ lung volumes at lower lim of norm...  Ambulatory oximetry 05/14/15>  O2sat=95% on RA at rest;  She ambulated 3 laps in the office w/ lowest O2sat=93% w/ max HR=94/min... Note- she had a neg Myoview by Cards in 2009.                    CXR 05/14/15    IMP >>     Dyspnea>  This is likely multifactorial w/ major components from anxiety, deconditioning, sedentary lifestyle, obesity...    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    OSA on CPAP>  eval & management by DrAthar for Neurology...    Ex-smoker, quit 1993, 30 pack-yr smoking hx    CARDIAC issues>  HBP & Palpitations followed by DrHarding...    MEDICAL issues>  HBP, palpitations, HL, DM (diet controlled), Obesity, Hypothyroid, colon polyps/ divertics/ hems, Ovarian Ca, Anxiety & PTSD PLAN >>     I spent some time in the office explaining how "chest  wall musc spasm" produces the SOB sensation of not getting enough air "IN" etc; she has been on Tranxene which clearly is not working so I proposed a change to a "combination relaxer" like KLONOPIN 0.5mg  Bid... She will let me know how this is working for her so we can make any dose adjustments over the phone, and we plan ROV recheck in about 6 weeks...   ~  May 02, 2017:  23 month ROV & pulmonary follow up visit>  Erin Myers did not return for follow up after her initial consult w/ me in Mar2017, and she reports that she didn't even fill the prescription for Klonopin that I prescribed on that occasion => see above problem list...  Epic Chart Review>>    She saw  CARDS-DrHarding 08/11/15>  Palpit, dyspnea, edema; on MetoprololER-75mg Bid, Hyzaar50-12.5, ASA81, Cres10; Exam showed lungs were clear bilat, & heart regular w/o m/r/g; pulses intact & no edema noted; dyspnea felt to be from obesity & deconditioning; they did a GXT 08/2015> Blood pressure demonstrated a hypertensive response to exercise; There was less than 0.68mm of horizontal ST segment depression in the inferolateral leads. No diagnostic criterial for ischemia. Moderately impaired exercise tolerance. The patient achieved 5.8 mets. This is a low risk study. (Pt asked to f/u 75yr).    She had mult visits to [facility] 03/2016 for her right sided LBP & siatica>  Notes reviewed...     She was evaluated & treated for dizziness/ BPPV/ & tinnitus 05/2016 by [facility]DrBates and Audiology>   Treated w/ Epley maneuvers & their notes are reviewed...     She saw PCP-DrPaterson 04/24/17>  C/o persistent cough, chest congestion & DOE; she had recently been treated w/ Levaquin500x7d; denies f/c;  Exam showed rales bilat ~1/2 way up; CXR (reviewed on disc from DrPaterson's office) showed RUL inhomogeneous opacity c/w pneumonia and sl inc interstitial markings in right base;      Currently she is c/o cough w/ beige sputum, no hemoptysis, notes a slight "crackling" in throat, and SOB described as a difficulty getting a deep breath & occurs w/ activity & when stressed; she says she still has "some anxiety" but decided on her own to NOT try the Klonopin I prescribed at out 1st OV 05/2015...  Her other chief complaint is "siatica" back pain/ hip pain- eval by Ortho DrBrooks & he sent her for physical therapy/ water exercises/ etc... EXAM shows Afeb, VSS, O2sat=94% on RA; wt=217#, 5'5"Tall, BMI=36;  HEENT- neg, mallampati2;  Chest- bibasilar crackles ~1/3rd way up, no wheezing, rhonchi, consolidation;  Heart- RR w/o m/r/g;  Abd- soft, nontender, neg;  Ext- neg w/o c/c/e;  Neuro- intact w/o focal abn...  CXR 05/02/17> norm  heart size, inhomogeneous opac in RUL c/w pneumonia, RLL scarring & vol loss from prev surg, left lung is clear...   Spirometry 05/02/17> very poor tracing- numbers generated are physiologic...  Ambulatory Oximetry 05/02/17>  O2sat=94% on RA at rest w/ pulse=65/min;  She ambulated on RA for 2 laps (185' ea) & stopped for hip/leg pain w/ lowest O2sat=88% w/ pulse=93/min...   LABS 05/02/17>  Chems- ok x HCO3=35, BS=106, LFTs wnl;  CBC- WBC=11.3, Hg=17.1;  TSH=0.49;  RA=NEG,  ANA=NEG,  Sed=13                     CXR 05/02/17   IMP/PLAN>>  Erin Myers appears to have a more focal RUL pulmonary process, prob atypical pneumonia & no signs of a more  diffuse pulm disease; prelim collagen vasc studies are NEG as well; we discussed additional Rx in light of her persistent CXR changes and her symptoms-- rec to start DOXY100mg Bid x10d and a Prednisone20mg  tabs- slow taper over 2-3 weeks (see AVS instructions); DrPaterson has already ordered a CT Chest for 05/12/17 & we will review the scan when done & discuss f/u visit after that... She has Chlorazepate for anxiety (7.5mg  Bid) and is still not inclined to switch to Klonopin rx...       NOTE:  >50% of this 76min ROV was spent in counseling & coordination of care...   ~  ADDENDUM>> CT Chest 05/12/17>>  Norm heart size, minor atherosclerotic plaque in Ao & none in coronaries; no adenopathy; airsp opac in RUL posteriorly w/ GGO & intervening areas of centrilob emphysema, some post-op scarring in right base; no lung nodules, masses, effusion, etc...  I called pt w/ report & discussed the focal nature of the opac, likely from atyp pneumonia and slow resolution due to XRay lag-time; we plan to finish Rx w/ doxy & slow Pred taper; add Mucinex 1200mg  bid + fluids & ROV w/ CXR in 39month...     Past Medical History:  Diagnosis Date  . Anxiety   . Diabetes mellitus without complication ([place])   . Dyslipidemia   . HTN (hypertension)   . Hyperlipemia   . Hypothyroid   .  Hypothyroidism   . Lung cancer ([place]) 2000   Carcinoid, s/p RLL lobectomy  . Obesity   . Ovarian cancer ([place])   . Palpitations 2009 and 2006   Negative nuclear stress test   . Sleep apnea    on C-pap    Past Surgical History:  Procedure Laterality Date  . ABDOMINAL HYSTERECTOMY  2001  . LOBECTOMY  2000   RLL  . NM MYOCAR PERF WALL MOTION  08/20/2007   EF 74%  EXERCISE CAPACITY 7 METS  . OOPHORECTOMY  2001  . TONSILLECTOMY      Outpatient Encounter Medications as of 05/02/2017  Medication Sig  . aspirin 81 MG tablet Take 81 mg by mouth daily.  . clorazepate (TRANXENE) 7.5 MG tablet Take 7.5 mg by mouth 2 (two) times daily as needed for anxiety.  [name] levothyroxine (SYNTHROID, LEVOTHROID) 112 MCG tablet Take 112 mcg by mouth daily before breakfast.  . losartan-hydrochlorothiazide (HYZAAR) 50-12.5 MG per tablet   . metoprolol succinate (TOPROL-XL) 50 MG 24 hr tablet Take 75 mg by mouth 2 (two) times daily.   . rosuvastatin (CRESTOR) 10 MG tablet Take 10 mg by mouth daily.   No facility-administered encounter medications on file as of 05/02/2017.     Allergies  Allergen Reactions  . Adhesive [Tape]   . Codeine   . Lactose Intolerance (Gi) Diarrhea  . Sulfa Antibiotics     Family History  Problem Relation Age of Onset  . Coronary artery disease Mother 91       died of an MI  . Heart attack Mother   . Suicidality Sister   . CVA Brother 65  . CVA Father     Social History   Socioeconomic History  . Marital status: Married    Spouse name: Not on file  . Number of children: 2  . Years of education: HS  . Highest education level: Not on file  Social Needs  . Financial resource strain: Not on file  . Food insecurity - worry: Not on file  . Food insecurity - inability: Not on file  .  Transportation needs - medical: Not on file  . Transportation needs - non-medical: Not on file  Occupational History  . Occupation: Insurance underwriter   Tobacco Use  . Smoking status: Former  Smoker    Packs/day: 0.75    Years: 30.00    Pack years: 22.50    Types: Cigarettes    Last attempt to quit: 08/18/1991    Years since quitting: 25.7  . Smokeless tobacco: Never Used  Substance and Sexual Activity  . Alcohol use: No    Alcohol/week: 0.0 oz  . Drug use: No  . Sexual activity: Not on file  Other Topics Concern  . Not on file  Social History Narrative   1-2 glasses of tea a day     Current Medications, Allergies, Past Medical History, Past Surgical History, Family History, and Social History were reviewed in [employer] record.   Review of Systems              All symptoms NEG except where BOLDED >>  Constitutional:  F/C/S, fatigue, anorexia, unexpected weight change. HEENT:  HA, visual changes, hearing loss, earache, nasal symptoms, sore throat, mouth sores, hoarseness. Resp:  cough, sputum, hemoptysis; SOB, tightness, wheezing. Cardio:  CP, palpit, DOE, orthopnea, edema. GI:  N/V/D/C, blood in stool; reflux, abd pain, distention, gas. GU:  dysuria, freq, urgency, hematuria, flank pain, voiding difficulty. MS:  joint pain, swelling, tenderness, decr ROM; neck pain, back pain, etc. Neuro:  HA, tremors, seizures, dizziness, syncope, weakness, numbness, gait abn. Skin:  suspicious lesions or skin rash. Heme:  adenopathy, bruising, bleeding. Psyche:  confusion, agitation, sleep disturbance, hallucinations, anxiety, depression suicidal.   Objective:   Physical Exam        Vital Signs:  Reviewed...   General:  WD, overweight, 77 y/o WF in NAD; alert & oriented; pleasant & cooperative... HEENT:  [place]/AT; Conjunctiva- pink, Sclera- nonicteric, EOM-wnl, PERRLA, EACs-clear, TMs-wnl; NOSE-clear; THROAT-clear & wnl.  Neck:  Supple w/ fair ROM; no JVD; normal carotid impulses w/o bruits; no thyromegaly or nodules palpated; no lymphadenopathy.  Chest:  Clear to P & A; without wheezes, rales, or rhonchi heard. Heart:  Regular Rhythm; norm S1 & S2  without murmurs, rubs, or gallops detected. Abdomen:  Obese, soft & nontender- no guarding or rebound; normal bowel sounds; no organomegaly or masses palpated. Ext:  Normal ROM; without deformities +arthritic changes; no varicose veins, +venous insuffic, or edema;  Pulses intact w/o bruits. Neuro:  CNs II-XII intact; motor testing normal; sensory testing normal; gait normal & balance OK. Derm:  No lesions noted; no rash etc. Lymph:  No cervical, supraclavicular, axillary, or inguinal adenopathy palpated.   Assessment:      IMP >>     04/2017-- prob RUL atypical pneumonia, Rx'd w/ Levaquin=> Doxy/ Pred...    Dyspnea>  This is likely multifactorial w/ major components from anxiety, deconditioning, sedentary lifestyle, obesity; I suggested Klonopin 0.5mg  Bid but she prefers her Clorazepate 7.5mg  Bid.    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 F/u CT Chest sched for 05/12/17- pending    OSA on CPAP>  eval & management by DrAthar for Neurology...    Spirometry & O2sats are all wnl...    Ex-smoker, quit 1993, 30 pack-yr smoking hx    CARDIAC issues>  HBP & Palpitations followed by DrHarding...    MEDICAL issues>  HBP, palpitations, HL (on Cres10), DM (diet controlled), Obesity (unsuccessful weight loss attempts), Hypothyroid (on Synthroid112),  colon polyps/ divertics/ hems, Ovarian Ca, Anxiety & PTSD (on Chlorazepate)  PLAN >>  05/14/15>   I spent some time in the office explaining how "chest wall musc spasm" produces the SOB sensation of not getting enough air "IN" etc; she has been on Tranxene which clearly is not working so I proposed a change to a "combination relaxer" like KLONOPIN 0.5mg  Bid... She will let me know how this is working for her so we can make any dose adjustments over the phone, and we plan ROV recheck in about 6 weeks... 05/02/17>   Erin Myers appears to have a more focal RUL pulmonary process, prob atypical pneumonia & no signs of a  more diffuse pulm disease; prelim collagen vasc studies are NEG as well; we discussed additional Rx in light of her persistent CXR changes and her symptoms-- rec to start DOXY100mg Bid x10d and a Prednisone20mg  tabs- slow taper over 2-3 weeks (see AVS instructions); DrPaterson has already ordered a CT Chest for 05/12/17 & we will review the scan when done & discuss f/u visit after that... She has Chlorazepate for anxiety (7.5mg  Bid) and is still not inclined to switch to Klonopin rx...      Plan:     Patient's Medications  New Prescriptions   No medications on file  Previous Medications   ASPIRIN 81 MG TABLET    Take 81 mg by mouth daily.   CLORAZEPATE (TRANXENE) 7.5 MG TABLET    Take 7.5 mg by mouth 2 (two) times daily as needed for anxiety.   LEVOTHYROXINE (SYNTHROID, LEVOTHROID) 112 MCG TABLET    Take 112 mcg by mouth daily before breakfast.   LOSARTAN-HYDROCHLOROTHIAZIDE (HYZAAR) 50-12.5 MG PER TABLET       METOPROLOL SUCCINATE (TOPROL-XL) 50 MG 24 HR TABLET    Take 75 mg by mouth 2 (two) times daily.    ROSUVASTATIN (CRESTOR) 10 MG TABLET    Take 10 mg by mouth daily.  Modified Medications   No medications on file  Discontinued Medications   No medications on file

## 2017-05-02 NOTE — Patient Instructions (Signed)
Mrs Ding-- it was very nice seeing you again...  Today we updated your med list in our EPIC system...    Continue your current medications the same for now...  Today we checked a follow up CXR, Spirometry breathing test, and ambulatory oximetry test, and some blood work...    I am concerned for a poss INTERSTITIAL LUNG DISAEASE process based on your history & exam...    We will contact you w/ the results & we will discuss further eval by phone...  Call for any questions or if I can be of service in any way.Marland KitchenMarland Kitchen

## 2017-05-03 ENCOUNTER — Telehealth: Payer: Self-pay | Admitting: Pulmonary Disease

## 2017-05-03 LAB — ANA: Anti Nuclear Antibody(ANA): NEGATIVE

## 2017-05-03 LAB — RHEUMATOID FACTOR

## 2017-05-03 NOTE — Telephone Encounter (Signed)
Xray disk placed on SN's desk

## 2017-05-08 ENCOUNTER — Telehealth: Payer: Self-pay | Admitting: Cardiology

## 2017-05-08 NOTE — Telephone Encounter (Signed)
Received incoming records from [facility] for upcoming appointment on 05/09/17 @ 4pm with Dr. Ellyn Hack. 05/08/17 ab

## 2017-05-09 ENCOUNTER — Encounter: Payer: Self-pay | Admitting: Cardiology

## 2017-05-09 ENCOUNTER — Ambulatory Visit: Payer: PPO | Admitting: Cardiology

## 2017-05-09 VITALS — BP 138/92 | HR 63 | Ht 65.0 in | Wt 217.6 lb

## 2017-05-09 DIAGNOSIS — E785 Hyperlipidemia, unspecified: Secondary | ICD-10-CM

## 2017-05-09 DIAGNOSIS — R0609 Other forms of dyspnea: Secondary | ICD-10-CM | POA: Diagnosis not present

## 2017-05-09 DIAGNOSIS — I10 Essential (primary) hypertension: Secondary | ICD-10-CM | POA: Diagnosis not present

## 2017-05-09 DIAGNOSIS — G4733 Obstructive sleep apnea (adult) (pediatric): Secondary | ICD-10-CM | POA: Diagnosis not present

## 2017-05-09 DIAGNOSIS — E669 Obesity, unspecified: Secondary | ICD-10-CM

## 2017-05-09 DIAGNOSIS — Z9989 Dependence on other enabling machines and devices: Secondary | ICD-10-CM | POA: Diagnosis not present

## 2017-05-09 DIAGNOSIS — Z8249 Family history of ischemic heart disease and other diseases of the circulatory system: Secondary | ICD-10-CM | POA: Diagnosis not present

## 2017-05-09 DIAGNOSIS — R002 Palpitations: Secondary | ICD-10-CM | POA: Diagnosis not present

## 2017-05-09 NOTE — Patient Instructions (Addendum)
Schedule Echo    May take a extra 1/2 tablet of Metoprolol if palpitations are bad    Your physician recommends that you schedule a follow-up appointment after echo

## 2017-05-09 NOTE — Progress Notes (Signed)
PCP: [name], MD  Clinic Note: Chief Complaint  Patient presents with  . Follow-up  . Shortness of Breath    Pt is currently sick and on antibitoics     HPI: Erin Myers is a 77 y.o. female with a PMH below who presents today for almost 2-year follow-up for dyspnea.. She is being seen at the request of [name], MD. --From February 11 visit, was noted to have oxygen desaturation to 87% after walking with a baseline of 96% returns to baseline in 1 minute. From January 24 evaluation was started on Levaquin. -Plan is for chest CT evaluation.  Lyrique Hakim was last seen on Aug 11, 2015 as a 2-year follow-up for palpitations and some exertional dyspnea/edema.  She had noted that she was not able to do the same amount of activity that she had been done before.  Palpitations were relatively well controlled with only rare fleeting episodes. -->  She was evaluated with a GXT noted below there was read as low risk.  Recent Hospitalizations: None  Studies Personally Reviewed - (if available, images/films reviewed: From Epic Chart or Care Everywhere)  June 2017, GXT/ETT: Blood pressure demonstrated a hypertensive response to exercise.  There was less than 0.21mm of horizontal ST segment depression in the inferolateral leads. No diagnostic criterial for ischemia. Moderately impaired exercise tolerance. The patient achieved 5.8 mets. This is a low risk study.  Interval History: Erin Myers presents here today mostly because of concerns for progressively worsening dyspnea on exertion.  She says her palpitations are rare and not bothersome to her.  She maybe has 1 or 2 every so often that last less than a minute.  May be 2 or 3 a month.  She is currently actually taking 50 twice daily metoprolol, and has been doing fine. What she now notes is that simply walking around the house and doing routine tasks makes her profoundly dizzy dyspneic.  She apparently was recovering from a  prolonged bronchitis/pneumonia spell, was told that maybe she had some already scarring.  But she says it her shortness of breath is not at rest.  And it really is only triggered by activity that really limits her exercise.  She will occasionally feel a little bit of a tight sensation in her chest but mostly it is just simply dyspnea.  She does not say that her heart rate goes up fast during the spells.  She says that her dyspnea is obviously made worse when she has coughing and her voice is starting to get better but has had always had a little bit of a crackle over the last month.  She says that she even gets short of breath when she talks.  She has not had any resting chest discomfort.  She sleeps sort of in a wedge pillow because of back pain which is a chronic thing for her and so is hard to tell if she has any orthopnea, but she denies any PND symptoms, simply because she is a CPAP. She denies any significant edema, just some mild trace pedal edema at the end of the day.  With controlled palpitations, she has not had any lightheadedness, dizziness or syncope/near syncope .  However, there is the exception of when she feels very dyspneic she does get lightheaded. No TIA or amaurosis fugax symptoms.  No melena, hematochezia, hematuria, or epstaxis. No claudication.  ROS: A comprehensive was performed. Review of Systems  Constitutional: Positive for malaise/fatigue. Negative for chills, fever and  weight loss (No real loss, but may be some gain).  HENT: Negative for congestion and nosebleeds.   Respiratory: Positive for cough (Improving gradually).   Gastrointestinal: Negative for blood in stool, constipation, diarrhea, heartburn and melena.  Genitourinary: Negative for dysuria and hematuria.  Musculoskeletal: Positive for back pain (Chronic back pain with sciatica; right-sided sacroiliac pain) and joint pain (Mild arthritis pains). Negative for myalgias.  Neurological: Positive for dizziness  (Per HPI). Negative for weakness.  Psychiatric/Behavioral: Negative for memory loss. The patient is nervous/anxious. The patient does not have insomnia.   All other systems reviewed and are negative.  I have reviewed and (if needed) personally updated the patient's problem list, medications, allergies, past medical and surgical history, social and family history.   Past Medical History:  Diagnosis Date  . Anxiety   . Diabetes mellitus without complication ([place])   . Dyslipidemia   . HTN (hypertension)   . Hyperlipemia   . Hypothyroid   . Hypothyroidism   . Lung cancer ([place]) 2000   Carcinoid, s/p RLL lobectomy  . Obesity   . Ovarian cancer ([place])   . Palpitations 2009 and 2006   Negative nuclear stress test   . Sleep apnea    on C-pap    Past Surgical History:  Procedure Laterality Date  . ABDOMINAL HYSTERECTOMY  2001  . GRADED EXERCISE TOLERANCE TEST  08/2015    Blood pressure demonstrated a hypertensive response to exercise.  There was less than 0.21mm of horizontal ST segment depression in the inferolateral leads. No diagnostic criterial for ischemia. Moderately impaired exercise tolerance. The patient achieved 5.8 mets. This is a low risk study.  . LOBECTOMY  2000   RLL  . NM MYOCAR PERF WALL MOTION  08/20/2007   EF 74%  EXERCISE CAPACITY 7 METS  . OOPHORECTOMY  2001  . TONSILLECTOMY      Current Meds  Medication Sig  . aspirin 81 MG tablet Take 81 mg by mouth daily.  . clorazepate (TRANXENE) 7.5 MG tablet Take 7.5 mg by mouth 2 (two) times daily as needed for anxiety.  [name] doxycycline (VIBRA-TABS) 100 MG tablet   . levothyroxine (SYNTHROID, LEVOTHROID) 112 MCG tablet Take 112 mcg by mouth daily before breakfast.  . losartan-hydrochlorothiazide (HYZAAR) 50-12.5 MG per tablet   . metoprolol succinate (TOPROL-XL) 50 MG 24 hr tablet Take 75 mg by mouth 2 (two) times daily.   . predniSONE (DELTASONE) 20 MG tablet   . rosuvastatin (CRESTOR) 10 MG tablet Take 10 mg by mouth  daily.  -She actually takes the metoprolol succinate 75 mg in the morning and 25 mg in the evening.  Allergies  Allergen Reactions  . Adhesive [Tape]   . Codeine   . Lactose Intolerance (Gi) Diarrhea  . Sulfa Antibiotics     Social History   Tobacco Use  . Smoking status: Former Smoker    Packs/day: 0.75    Years: 30.00    Pack years: 22.50    Types: Cigarettes    Last attempt to quit: 08/18/1991    Years since quitting: 25.7  . Smokeless tobacco: Never Used  Substance Use Topics  . Alcohol use: No    Alcohol/week: 0.0 oz  . Drug use: No   Social History   Social History Narrative   1-2 glasses of tea a day    Married mother of 2 (daughter close to 68 and son 45-63 years old).  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    family history includes CVA in her father; CVA (age of onset: 77) in her brother; Coronary artery disease (age of onset: 72) in her mother; Heart attack in her mother; Suicidality in her sister.  Wt Readings from Last 3 Encounters:  05/09/17 217 lb 9.6 oz (98.7 kg)  05/02/17 217 lb (98.4 kg)  08/11/15 221 lb 9.6 oz (100.5 kg)    PHYSICAL EXAM BP (!) 138/92 (BP Location: Right Arm, Patient Position: Sitting, Cuff Size: Large)   Pulse 63   Ht 5\' 5"  (1.651 m)   Wt 217 lb 9.6 oz (98.7 kg)   BMI 36.21 kg/m  Physical Exam  Constitutional: She is oriented to person, place, and time. She appears well-developed and well-nourished. No distress.  Moderately obese.  Well-groomed  HENT:  Head: Normocephalic and atraumatic.  Mouth/Throat: No oropharyngeal exudate.  Eyes: Conjunctivae and EOM are normal. Pupils are equal, round, and reactive to light.  Neck: No hepatojugular reflux and no JVD present. Carotid bruit is not present.  Cardiovascular: Normal rate, regular rhythm, normal heart sounds and intact distal pulses. PMI is not displaced (Cannot palpate). Exam reveals no gallop and no  friction rub.  No murmur heard. Pulmonary/Chest: She has no wheezes. She has no rales. She exhibits no tenderness.  Bilateral, right more than left basal diminished sounds with rhonchi and dullness to percussion.  Abdominal: Soft. Bowel sounds are normal. She exhibits no distension. There is no tenderness. There is no rebound.  Obese  Musculoskeletal: Normal range of motion. She exhibits no edema (Trivial only).  Lymphadenopathy:    She has no cervical adenopathy.  Neurological: She is alert and oriented to person, place, and time.  Skin: Skin is warm and dry.  Psychiatric: She has a normal mood and affect. Her behavior is normal. Judgment and thought content normal.  Nursing note and vitals reviewed.   Adult ECG Report  Rate: 63;  Rhythm: normal sinus rhythm and Normal axis, intervals and durations;   Narrative Interpretation: Normal EKG  Other studies Reviewed: Additional studies/ records that were reviewed today include:  Recent Labs: Labs unavailable   ASSESSMENT / PLAN: Problem List Items Addressed This Visit    Dyslipidemia, goal LDL below 130 (Chronic)    On statin.  Managed by PCP.  Labs not available.      Dyspnea - Primary (Chronic)    She has exertional dyspnea which is difficult to determine true etiology.  Certainly one must think ischemic, however there is no angina involved, and with her having dullness to percussion in the bases I am concerned that there may be some component of CHF.  We will check an echo first.  Depending on what her other noncardiac evaluation shows, and her symptoms and follow-up, low threshold for considering ischemic evaluation with probably a coronary CT angiogram.   Unfortunately, based on the timing of this visit and her scheduled CT scan ordered by Dr. Sharlett Iles, I do not think we can get the 2 done together.  We can check the results of that CT scan to determine if there is any coronary calcification which would help prompt further  evaluation.  Plan for now check 2D echocardiogram then reassess.       Relevant Orders   EKG 12-Lead (Completed)   ECHOCARDIOGRAM COMPLETE   Essential hypertension (Chronic)    Borderline blood pressure today on current dose of Hyzaar and metoprolol.  Perhaps consider titration of Hyzaar to 100/25 mg  for additional afterload reduction and mild diuresis.      Relevant Orders   EKG 12-Lead (Completed)   ECHOCARDIOGRAM COMPLETE   Family history of coronary artery disease (Chronic)    She is on a beta-blocker, statin and aspirin.  Again low threshold to consider ischemic evaluation after echocardiogram pending which shows.  Previous stress tests have been negative.      Obesity (BMI 30-39.9) (Chronic)    Her obesity is clearly playing a large part of her dyspnea.  Unfortunately, with her current level of dyspnea she probably will be to lose weight because she will not exercise.      OSA on CPAP (Chronic)   Relevant Orders   EKG 12-Lead (Completed)   ECHOCARDIOGRAM COMPLETE   Palpitations (Chronic)    Well-controlled on beta-blocker.  Okay to take additional half dose of Toprol for palpitations.      Relevant Orders   EKG 12-Lead (Completed)   ECHOCARDIOGRAM COMPLETE      Current medicines are reviewed at length with the patient today. (+/- concerns) none The following changes have been made:None  Patient Instructions  Schedule Echo    May take a extra 1/2 tablet of Metoprolol if palpitations are bad    Your physician recommends that you schedule a follow-up appointment after echo    Studies Ordered:   Orders Placed This Encounter  Procedures  . EKG 12-Lead  . ECHOCARDIOGRAM COMPLETE      Glenetta Hew, M.D., M.S. Interventional Cardiologist   Pager # [phone] Phone # [phone] [address]. [place], [place] [address]   Thank you for choosing [facility] at [facility]!!

## 2017-05-10 NOTE — Telephone Encounter (Signed)
SN please advise if you have seen the xray disk.

## 2017-05-11 ENCOUNTER — Other Ambulatory Visit: Payer: PPO

## 2017-05-11 ENCOUNTER — Encounter: Payer: Self-pay | Admitting: Cardiology

## 2017-05-11 ENCOUNTER — Encounter: Payer: Self-pay | Admitting: Pulmonary Disease

## 2017-05-11 NOTE — Assessment & Plan Note (Signed)
On statin.  Managed by PCP.  Labs not available.

## 2017-05-11 NOTE — Assessment & Plan Note (Addendum)
Borderline blood pressure today on current dose of Hyzaar and metoprolol.  Perhaps consider titration of Hyzaar to 100/25 mg for additional afterload reduction and mild diuresis.

## 2017-05-11 NOTE — Assessment & Plan Note (Signed)
She is on a beta-blocker, statin and aspirin.  Again low threshold to consider ischemic evaluation after echocardiogram pending which shows.  Previous stress tests have been negative.

## 2017-05-11 NOTE — Assessment & Plan Note (Addendum)
She has exertional dyspnea which is difficult to determine true etiology.  Certainly one must think ischemic, however there is no angina involved, and with her having dullness to percussion in the bases I am concerned that there may be some component of CHF.  We will check an echo first.  Depending on what her other noncardiac evaluation shows, and her symptoms and follow-up, low threshold for considering ischemic evaluation with probably a coronary CT angiogram.   Unfortunately, based on the timing of this visit and her scheduled CT scan ordered by Dr. Sharlett Iles, I do not think we can get the 2 done together.  We can check the results of that CT scan to determine if there is any coronary calcification which would help prompt further evaluation.  Plan for now check 2D echocardiogram then reassess.

## 2017-05-11 NOTE — Assessment & Plan Note (Signed)
Her obesity is clearly playing a large part of her dyspnea.  Unfortunately, with her current level of dyspnea she probably will be to lose weight because she will not exercise.

## 2017-05-11 NOTE — Assessment & Plan Note (Signed)
Well-controlled on beta-blocker.  Okay to take additional half dose of Toprol for palpitations.

## 2017-05-11 NOTE — Telephone Encounter (Signed)
Per SN - Reviewed the film and it is similar to the one she had on 05/02/17.  Stick to the plan of Doxycycline 100mg  x's 10 days, Prednisone taper as directed.   He will review CT Chest scheduled 05/12/17 and will call her. Nothing further needed.

## 2017-05-12 ENCOUNTER — Ambulatory Visit
Admission: RE | Admit: 2017-05-12 | Discharge: 2017-05-12 | Disposition: A | Discharge status: Home / Self Care | Payer: PPO | Source: Ambulatory Visit | Attending: Internal Medicine | Admitting: Internal Medicine

## 2017-05-12 DIAGNOSIS — R0602 Shortness of breath: Secondary | ICD-10-CM

## 2017-05-12 DIAGNOSIS — J439 Emphysema, unspecified: Secondary | ICD-10-CM | POA: Diagnosis not present

## 2017-05-12 MED ORDER — IOPAMIDOL (ISOVUE-300) INJECTION 61%
75.0000 mL | Freq: Once | INTRAVENOUS | Status: AC | PRN
  Administered 2017-05-12: 75 mL via INTRAVENOUS

## 2017-05-15 ENCOUNTER — Telehealth: Payer: Self-pay | Admitting: Pulmonary Disease

## 2017-05-15 NOTE — Telephone Encounter (Signed)
Pt had a CT chest w contrast ordered by Dr. Philip Aspen on 3/1- impression is in chart.  Pt is requesting that SN review this CT and advise on any additional recs.  SN please advise.  Thanks!

## 2017-05-15 NOTE — Telephone Encounter (Signed)
I called pt w/ report of her CT Chest 05/12/17... See office note addendum... SMN

## 2017-05-24 ENCOUNTER — Ambulatory Visit (HOSPITAL_COMMUNITY): Payer: PPO | Attending: Cardiology

## 2017-05-24 ENCOUNTER — Other Ambulatory Visit: Payer: Self-pay

## 2017-05-24 DIAGNOSIS — G4733 Obstructive sleep apnea (adult) (pediatric): Secondary | ICD-10-CM

## 2017-05-24 DIAGNOSIS — E785 Hyperlipidemia, unspecified: Secondary | ICD-10-CM | POA: Insufficient documentation

## 2017-05-24 DIAGNOSIS — Z9989 Dependence on other enabling machines and devices: Secondary | ICD-10-CM | POA: Diagnosis not present

## 2017-05-24 DIAGNOSIS — E119 Type 2 diabetes mellitus without complications: Secondary | ICD-10-CM | POA: Insufficient documentation

## 2017-05-24 DIAGNOSIS — R002 Palpitations: Secondary | ICD-10-CM

## 2017-05-24 DIAGNOSIS — I10 Essential (primary) hypertension: Secondary | ICD-10-CM | POA: Diagnosis not present

## 2017-05-24 DIAGNOSIS — R0609 Other forms of dyspnea: Secondary | ICD-10-CM

## 2017-05-29 ENCOUNTER — Telehealth: Payer: Self-pay | Admitting: *Deleted

## 2017-05-29 DIAGNOSIS — H43813 Vitreous degeneration, bilateral: Secondary | ICD-10-CM | POA: Diagnosis not present

## 2017-05-29 NOTE — Telephone Encounter (Signed)
LEFT MESSAGE TO CALL BACK

## 2017-05-29 NOTE — Telephone Encounter (Signed)
-----   Message from Leonie Man, MD sent at 05/25/2017  6:03 PM EDT ----- Echocardiogram shows normal LV systolic function with only grade 1 diastolic dysfunction.  Mild to moderately elevated PA pressures.  This would probably be at least in part related to dyspnea, but would suggest likely primary pulmonary etiology.  His symptoms are significant, we could talk about basically evaluate, however these pressures not nearly high enough to suspect that this is the reason for her being profoundly dyspneic.  Marland KitchenGlenetta Hew, MD

## 2017-05-30 ENCOUNTER — Ambulatory Visit: Payer: PPO | Admitting: Pulmonary Disease

## 2017-05-30 ENCOUNTER — Encounter: Payer: Self-pay | Admitting: Pulmonary Disease

## 2017-05-30 ENCOUNTER — Ambulatory Visit (INDEPENDENT_AMBULATORY_CARE_PROVIDER_SITE_OTHER)
Admission: RE | Admit: 2017-05-30 | Discharge: 2017-05-30 | Disposition: A | Discharge status: Home / Self Care | Payer: PPO | Source: Ambulatory Visit | Attending: Pulmonary Disease | Admitting: Pulmonary Disease

## 2017-05-30 VITALS — BP 130/60 | HR 78 | Temp 98.0°F | Ht 65.0 in | Wt 216.2 lb

## 2017-05-30 DIAGNOSIS — J181 Lobar pneumonia, unspecified organism: Secondary | ICD-10-CM

## 2017-05-30 DIAGNOSIS — Z9989 Dependence on other enabling machines and devices: Secondary | ICD-10-CM

## 2017-05-30 DIAGNOSIS — R0600 Dyspnea, unspecified: Secondary | ICD-10-CM

## 2017-05-30 DIAGNOSIS — R079 Chest pain, unspecified: Secondary | ICD-10-CM | POA: Diagnosis not present

## 2017-05-30 DIAGNOSIS — I10 Essential (primary) hypertension: Secondary | ICD-10-CM | POA: Diagnosis not present

## 2017-05-30 DIAGNOSIS — R002 Palpitations: Secondary | ICD-10-CM

## 2017-05-30 DIAGNOSIS — E669 Obesity, unspecified: Secondary | ICD-10-CM

## 2017-05-30 DIAGNOSIS — G4733 Obstructive sleep apnea (adult) (pediatric): Secondary | ICD-10-CM | POA: Diagnosis not present

## 2017-05-30 DIAGNOSIS — Z859 Personal history of malignant neoplasm, unspecified: Secondary | ICD-10-CM

## 2017-05-30 DIAGNOSIS — J189 Pneumonia, unspecified organism: Secondary | ICD-10-CM

## 2017-05-30 DIAGNOSIS — E039 Hypothyroidism, unspecified: Secondary | ICD-10-CM

## 2017-05-30 DIAGNOSIS — Z8659 Personal history of other mental and behavioral disorders: Secondary | ICD-10-CM

## 2017-05-30 MED ORDER — PREDNISONE 20 MG PO TABS: ORAL_TABLET | ORAL | 0 refills | Status: DC

## 2017-05-30 NOTE — Progress Notes (Signed)
Subjective:     Patient ID: Erin Myers, female   DOB: 1940-09-14, 77 y.o.   MRN: [id number]  HPI  ~  May 14, 2015:  Initial pulmonary consult by SN>   23 y/o WF referred by DrAthar- Neurology,  for a pulmonary evaluation due to dyspnea; her PCP is Dr. Jeanine Luz relates a hx of SOB dating back ~25yr; it appears to be quite variable- sometimes occuring w/ exertion & other times when she is just resting; It is described as a feeling of not being able to get a deep breath, can't get the air "IN", and not satisfied breathing "I can't get oxygen"; she had a URI in [id number] w/ cough which has mostly resolved but her intermittent SOB is unchanged; she also has a discomfort in her upper chest; she notes that DrPaterson has done CXRs and Spirometry in the past- she was told they were OK; she has been under a lot of stress- husb ill, out of work, PTSD...  Smoking Hx>  She is an ex-smoker, starting at 45, smoked for 53yrs up to 1ppd, quit in 1993 when she was around grandchildren...  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  Medical Hx>  Hx HBP, palpitations, HL, DM (diet controlled), Obesity, Hypothyroid, colon polyps/ divertics/ hems, Ovarian Ca, Anxiety & PTSD... She has prev seen DrLittle for the palpit (now DrHarding).  Family Hx>  Neg for hx respiratory problems; pos for heart disease & stroke...  Occup Hx>  Former Advertising copywriter; No known exposures to asbestos, silica dust, other inorganic or organic dusts, etc...  Current Meds>  ASA81, ToprolXL50-3/d, Losar50, Crestor10, Synthroid112, Tranxene7.5- taking 1/2Bid...  EXAM shows Afeb, VSS, O2sat=97% on RA; wt=215#,  5'5"Tall, BMI=36;  HEENT- neg, mallampati2;  Chest- clear w/o w/r/r;  Heart- RR w/o m/r/g;  Abd- soft, nontender, neg;  Ext- neg w/o c/c/e;  Neuro- intact w/o focal abn...  CXR 05/14/15>  Norm heart size, post surg changes on right, clear lungs, NAD...  Spirometry 05/14/15>  FVC=2.42 (82%), FEV1=1.86 (83%), %1sec=77%, mid-flows are wnl at 84% predicted... This is a normal spirometry w/ lung volumes at lower lim of norm...  Ambulatory oximetry 05/14/15>  O2sat=95% on RA at rest;  She ambulated 3 laps in the office w/ lowest O2sat=93% w/ max HR=94/min... Note- she had a neg Myoview by Cards in 2009.                    CXR 05/14/15    IMP >>     Dyspnea>  This is likely multifactorial w/ major components from anxiety, deconditioning, sedentary lifestyle, obesity...    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    OSA on CPAP>  eval & management by DrAthar for Neurology...    Ex-smoker, quit 1993, 30 pack-yr smoking hx    CARDIAC issues>  HBP & Palpitations followed by DrHarding...    MEDICAL issues>  HBP, palpitations, HL, DM (diet controlled), Obesity, Hypothyroid, colon polyps/ divertics/ hems, Ovarian Ca, Anxiety & PTSD PLAN >>     I spent some time in the office explaining how "chest  wall musc spasm" produces the SOB sensation of not getting enough air "IN" etc; she has been on Tranxene which clearly is not working so I proposed a change to a "combination relaxer" like KLONOPIN 0.5mg  Bid... She will let me know how this is working for her so we can make any dose adjustments over the phone, and we plan ROV recheck in about 6 weeks...   ~  May 02, 2017:  23 month ROV & pulmonary follow up visit>  [name] did not return for follow up after her initial consult w/ me in Mar2017, and she reports that she didn't even fill the prescription for Klonopin that I prescribed on that occasion => see above problem list...  Epic Chart Review>>    She saw  [facility]-DrHarding 08/11/15>  Palpit, dyspnea, edema; on MetoprololER-75mg Bid, Hyzaar50-12.5, ASA81, Cres10; Exam showed lungs were clear bilat, & heart regular w/o m/r/g; pulses intact & no edema noted; dyspnea felt to be from obesity & deconditioning; they did a GXT 08/2015> Blood pressure demonstrated a hypertensive response to exercise; There was less than 0.76mm of horizontal ST segment depression in the inferolateral leads. No diagnostic criterial for ischemia. Moderately impaired exercise tolerance. The patient achieved 5.8 mets. This is a low risk study. (Pt asked to f/u 42yr).    She had mult visits to [facility] 03/2016 for her right sided LBP & siatica>  Notes reviewed...     She was evaluated & treated for dizziness/ BPPV/ & tinnitus 05/2016 by [facility] and Audiology>   Treated w/ Epley maneuvers & their notes are reviewed...     She saw PCP-DrPaterson 04/24/17>  C/o persistent cough, chest congestion & DOE; she had recently been treated w/ Levaquin500x7d; denies f/c;  Exam showed rales bilat ~1/2 way up; CXR (reviewed on disc from DrPaterson's office) showed RUL inhomogeneous opacity c/w pneumonia and sl inc interstitial markings in right base;      Currently she is c/o cough w/ beige sputum, no hemoptysis, notes a slight "crackling" in throat, and SOB described as a difficulty getting a deep breath & occurs w/ activity & when stressed; she says she still has "some anxiety" but decided on her own to NOT try the Klonopin I prescribed at out 1st OV 05/2015...  Her other chief complaint is "siatica" back pain/ hip pain- eval by Ortho DrBrooks & he sent her for physical therapy/ water exercises/ etc... EXAM shows Afeb, VSS, O2sat=94% on RA; wt=217#, 5'5"Tall, BMI=36;  HEENT- neg, mallampati2;  Chest- bibasilar crackles ~1/3rd way up, no wheezing, rhonchi, consolidation;  Heart- RR w/o m/r/g;  Abd- soft, nontender, neg;  Ext- neg w/o c/c/e;  Neuro- intact w/o focal abn...  CXR 05/02/17> norm  heart size, inhomogeneous opac in RUL c/w pneumonia, RLL scarring & vol loss from prev surg, left lung is clear...   Spirometry 05/02/17> very poor tracing- numbers generated are physiologic...  Ambulatory Oximetry 05/02/17>  O2sat=94% on RA at rest w/ pulse=65/min;  She ambulated on RA for 2 laps (185' ea) & stopped for hip/leg pain w/ lowest O2sat=88% w/ pulse=93/min...   LABS 05/02/17>  Chems- ok x HCO3=35, BS=106, LFTs wnl;  CBC- WBC=11.3, Hg=17.1;  TSH=0.49;  RA=NEG,  ANA=NEG,  Sed=13                     CXR 05/02/17   IMP/PLAN>>  MrsRichey appears to have a more focal RUL pulmonary process, prob atypical pneumonia & no signs of a more  diffuse pulm disease; prelim collagen vasc studies are NEG as well; we discussed additional Rx in light of her persistent CXR changes and her symptoms-- rec to start DOXY100mg Bid x10d and a Prednisone20mg  tabs- slow taper over 2-3 weeks (see AVS instructions); DrPaterson has already ordered a CT Chest for 05/12/17 & we will review the scan when done & discuss f/u visit after that... She has Chlorazepate for anxiety (7.5mg  Bid) and is still not inclined to switch to Klonopin rx...       NOTE:  >50% of this 38min ROV was spent in counseling & coordination of care...   ~  ADDENDUM>> CT Chest 05/12/17>>  Norm heart size, minor atherosclerotic plaque in Ao & none in coronaries; no adenopathy; airsp opac in RUL posteriorly w/ GGO & intervening areas of centrilob emphysema, some post-op scarring in right base; no lung nodules, masses, effusion, etc...  I called pt w/ report & discussed the focal nature of the opac, likely from atyp pneumonia and slow resolution due to XRay lag-time; we plan to finish Rx w/ doxy & slow Pred taper; add Mucinex 1200mg  bid + fluids & ROV w/ CXR in 12month...    ~  May 30, 2017:  31mo ROV & when seen 05/02/17 MrsRichey appeared to have an atypical RUL pneumonia & lack of roentgenographic improvement chalked up to prob XRay "lag-time";  Her initial  therapy by DrPaterson w/ Levaquin was supplemented w/ Doxy & Mucinex + a brief course of Pred;  She has a CT Chest done on 05/12/17> predom finding was airsp dis in RUL posteriorly & some scarring (likely post-op) at right base;  According to pt & her husb she seemed to improve on the Pred, less cough & congestion, less SOB & she started walking again;  But when the Pred ran out she feels she started downhill again, 4d ago had chills, temp to 100.3, incr cough & whitish/beige sput w/o hemoptysis,and incr in SOB- again feeling like she can't get a deep breath, she reduced her exercise to just walking around in the house...    She had f/u visit w/ [facility]- DrHarding on 05/09/17>  F/u for dyspnea- hx palpit & some edema, EKG was wnl & prev GXT in 2017 was neg for ischemia;  BP & palpit controlled on Metoprolol & Hyzaar; she also remains on statin & ASA, she remains obese & hasn't lost any weight;  No change in meds and they ordered a 2DEcho> done 05/24/17>  LV showed mild LVH, vigorous LVF w/ EF=65-70%, no regional wall motion abn, there was Gr1DD, valves ok, atria ok, RV size & function wnl, but PAsys=2mmHg... She is on CPAP from DrAthar for OSA w/ sleep study 12/2014 showing AHI=26/hr & O2 desaturations;  subseq CPAP titration 01/2015 showed optimal CPAP=11cmH2O;  A follow up ONO 02/2015 while on CPAP showed O2desat to 73% & time below 88% was >4H therefore O2 was ordered to bleed into the CPAP qhs;  CPAP compliance date was checked 03/2015 w/ excellent compliance & resid AHI=1.1/hr & no signif air leak... 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    MEDICAL issues>  HL (Cres10), DM (diet controlled), hypothy ([id number]), obesity, colon polyps/ divertics/ hems, Hx ovarian ca, anxiety (Tranxene7.5) & PTSD EXAM shows Afeb, VSS, O2sat=90% on RA; wt=216#, 5'5"Tall, BMI=36;  HEENT- neg, mallampati2;  Chest- bibasilar crackles ~1/3rd way up, no wheezing, +few rhonchi, no consolidation;  Heart- RR w/o m/r/g;  Abd- soft, nontender, neg;  Ext- tr edema, neg w/o c/c;  Neuro- intact w/o focal abn...  CXR 05/30/17 (independently reviewed by me in the PACS system) showed persistent airsp consolidation in RUL c/w pneumonia, no real change from older films in Feb...  CPAP download => pending IMP/PLAN>>  Her baseline CXR in 2017 showed some post-op scarring in right base, otherw clear; next films were 2/19 w/ RUL pneumonia that has been slow to clear (?related to XRay lag-time vs other reason?); complicating problems include her hx of RLL resection for carcinoid tumor in 2000, OSA on CPAP, nocturnal hypoxemia despite the CPAP & her decision NOT to use the O2, secondary pulm HTN per 2DEcho, and her multifactorial dyspnea... REC to treat w/ IS lung exerciser, longer course of oral Pred, GFN400-2Tid + fluids; we need CPAP download & another ONO to assess these needs vis-a-vis her pulmHTN...     Past Medical History:  Diagnosis Date  . Anxiety   . Diabetes mellitus without complication ([place])   . Dyslipidemia   . HTN (hypertension)   . Hyperlipemia   . Hypothyroid   . Hypothyroidism   . Lung cancer ([place]) 2000   Carcinoid, s/p RLL lobectomy  . Obesity   . Ovarian cancer ([place])   . Palpitations 2009 and 2006   Negative nuclear stress test   . Sleep apnea    on C-pap    Past Surgical  History:  Procedure Laterality Date  . ABDOMINAL HYSTERECTOMY  2001  . GRADED EXERCISE TOLERANCE TEST  08/2015    Blood pressure demonstrated a hypertensive response to exercise.  There was less than 0.72mm of horizontal ST segment depression in the inferolateral leads. No diagnostic criterial for ischemia. Moderately impaired exercise tolerance. The patient achieved 5.8 mets. This is a low risk study.  . LOBECTOMY  2000   RLL  . NM MYOCAR PERF WALL MOTION  08/20/2007   EF 74%  EXERCISE CAPACITY 7 METS  . OOPHORECTOMY  2001  . TONSILLECTOMY      Outpatient Encounter Medications as of 05/30/2017  Medication Sig  . aspirin 81 MG tablet Take 81 mg by mouth daily.  . clorazepate (TRANXENE) 7.5 MG tablet Take 7.5 mg by mouth 2 (two) times daily as needed for anxiety.  [name] levothyroxine (SYNTHROID, LEVOTHROID) 112 MCG tablet Take 112 mcg by mouth daily before breakfast.  . loratadine (CLARITIN) 10 MG tablet Take 10 mg by mouth daily.  [name] losartan-hydrochlorothiazide (HYZAAR) 50-12.5 MG per tablet   . metoprolol succinate (TOPROL-XL) 50 MG 24 hr tablet Take 75 mg by mouth 2 (two) times daily.   . rosuvastatin (CRESTOR) 10 MG tablet Take 10 mg by mouth daily.  . predniSONE (DELTASONE) 20 MG tablet Take 1 2/dayx5day,1x5day,1/2in AM until return in 51month  . [DISCONTINUED] doxycycline (VIBRA-TABS) 100 MG tablet   . [  DISCONTINUED] predniSONE (DELTASONE) 20 MG tablet    No facility-administered encounter medications on file as of 05/30/2017.     Allergies  Allergen Reactions  . Adhesive [Tape]   . Codeine   . Lactose Intolerance (Gi) Diarrhea  . Sulfa Antibiotics     Current Medications, Allergies, Past Medical History, Past Surgical History, Family History, and Social History were reviewed in [employer] record.   Review of Systems             All symptoms NEG except where BOLDED >>  Constitutional:  F/C/S, fatigue, anorexia, unexpected weight change. HEENT:  HA,  visual changes, hearing loss, earache, nasal symptoms, sore throat, mouth sores, hoarseness. Resp:  cough, sputum, hemoptysis; SOB, tightness, wheezing. Cardio:  CP, palpit, DOE, orthopnea, edema. GI:  N/V/D/C, blood in stool; reflux, abd pain, distention, gas. GU:  dysuria, freq, urgency, hematuria, flank pain, voiding difficulty. MS:  joint pain, swelling, tenderness, decr ROM; neck pain, back pain, etc. Neuro:  HA, tremors, seizures, dizziness, syncope, weakness, numbness, gait abn. Skin:  suspicious lesions or skin rash. Heme:  adenopathy, bruising, bleeding. Psyche:  confusion, agitation, sleep disturbance, hallucinations, anxiety, depression suicidal.   Objective:   Physical Exam       Vital Signs:  Reviewed...   General:  WD, overweight, 77 y/o WF in NAD; alert & oriented; pleasant & cooperative... HEENT:  [place]/AT; Conjunctiva- pink, Sclera- nonicteric, EOM-wnl, PERRLA, EACs-clear, TMs-wnl; NOSE-clear; THROAT-clear & wnl.  Neck:  Supple w/ fair ROM; no JVD; normal carotid impulses w/o bruits; no thyromegaly or nodules palpated; no lymphadenopathy.  Chest:  Clear to P & A; without wheezes, rales, or rhonchi heard. Heart:  Regular Rhythm; norm S1 & S2 without murmurs, rubs, or gallops detected. Abdomen:  Obese, soft & nontender- no guarding or rebound; normal bowel sounds; no organomegaly or masses palpated. Ext:  Normal ROM; without deformities +arthritic changes; no varicose veins, +venous insuffic, or edema;  Pulses intact w/o bruits. Neuro:  CNs II-XII intact; motor testing normal; sensory testing normal; gait normal & balance OK. Derm:  No lesions noted; no rash etc. Lymph:  No cervical, supraclavicular, axillary, or inguinal adenopathy palpated.   Assessment:      IMP >>     04/2017-- prob RUL atypical pneumonia, Rx'd w/ Levaquin=> Doxy/ Pred...    Dyspnea>  This is likely multifactorial w/ major components from anxiety, deconditioning, sedentary lifestyle, obesity; I  suggested Klonopin 0.5mg  Bid but she prefers her Clorazepate 7.5mg  Bid.    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 F/u CT Chest sched for 05/12/17- pending    OSA on CPAP>  eval & management by DrAthar for Neurology...    Spirometry & O2sats are all wnl...    Ex-smoker, quit 1993, 30 pack-yr smoking hx    CARDIAC issues>  HBP & Palpitations followed by DrHarding...    MEDICAL issues>  HBP, palpitations, HL (on Cres10), DM (diet controlled), Obesity (unsuccessful weight loss attempts), Hypothyroid (on Synthroid112), colon polyps/ divertics/ hems, Ovarian Ca, Anxiety & PTSD (on Chlorazepate)  PLAN >>  05/14/15>   I spent some time in the office explaining how "chest wall musc spasm" produces the SOB sensation of not getting enough air "IN" etc; she has been on Tranxene which clearly is not working so I proposed a change to a "combination relaxer" like KLONOPIN 0.5mg  Bid... She will let me know how this is working for her so we can make any  dose adjustments over the phone, and we plan ROV recheck in about 6 weeks... 05/02/17>   MrsRichey appears to have a more focal RUL pulmonary process, prob atypical pneumonia & no signs of a more diffuse pulm disease; prelim collagen vasc studies are NEG as well; we discussed additional Rx in light of her persistent CXR changes and her symptoms-- rec to start DOXY100mg Bid x10d and a Prednisone20mg  tabs- slow taper over 2-3 weeks (see AVS instructions); DrPaterson has already ordered a CT Chest for 05/12/17 & we will review the scan when done & discuss f/u visit after that... She has Chlorazepate for anxiety (7.5mg  Bid) and is still not inclined to switch to Klonopin rx...      Plan:     Patient's Medications  New Prescriptions   PREDNISONE (DELTASONE) 20 MG TABLET    Take 1 2/dayx5day,1x5day,1/2in AM until return in 20month  Previous Medications   ASPIRIN 81 MG TABLET    Take 81 mg by mouth daily.   CLORAZEPATE  (TRANXENE) 7.5 MG TABLET    Take 7.5 mg by mouth 2 (two) times daily as needed for anxiety.   LEVOTHYROXINE (SYNTHROID, LEVOTHROID) 112 MCG TABLET    Take 112 mcg by mouth daily before breakfast.   LORATADINE (CLARITIN) 10 MG TABLET    Take 10 mg by mouth daily.   LOSARTAN-HYDROCHLOROTHIAZIDE (HYZAAR) 50-12.5 MG PER TABLET       METOPROLOL SUCCINATE (TOPROL-XL) 50 MG 24 HR TABLET    Take 75 mg by mouth 2 (two) times daily.    ROSUVASTATIN (CRESTOR) 10 MG TABLET    Take 10 mg by mouth daily.  Modified Medications   No medications on file  Discontinued Medications   DOXYCYCLINE (VIBRA-TABS) 100 MG TABLET       PREDNISONE (DELTASONE) 20 MG TABLET

## 2017-05-30 NOTE — Telephone Encounter (Signed)
Follow up    Patient is returning call in reference to echocardiogram. Please call to discuss.

## 2017-05-30 NOTE — Telephone Encounter (Signed)
Patient called w/results. Advised Dr. Lenna Gilford can review report from a lung standpoint at her visit today and Dr. Ellyn Hack will review on 4/1. She has no questions/concerns about the report findings at this time.

## 2017-05-30 NOTE — Patient Instructions (Signed)
Today we updated your med list in our EPIC system...     Today we rechecked your CXR...    We will contact you w/ the results when available...   We will need to get you back on the Prednisone- using a slightly longer & slower tapering schedule...    Start with one tab twice daily for 5 day...    Then decrease to 1 tab each AM for 5 days...    Then decrease to 1/2 tab daily each AM until your return visit...   In addition I would like you to get an OTC MUCOLYTIC agent- Guaifenesin (try the OTC- Mucus Relief 400mg  tabs)    Take 2 tabs three times daily w/ extra water/ fluids...  Concentrate on good deep breaths and use the INCENTIVE SPIROMETER device to expand your lung bases...  Call for any questions...  Let's plan a follow up visit in 30mo, sooner if needed for additional problems.Marland KitchenMarland Kitchen

## 2017-06-12 ENCOUNTER — Ambulatory Visit (INDEPENDENT_AMBULATORY_CARE_PROVIDER_SITE_OTHER): Payer: PPO | Admitting: Cardiology

## 2017-06-12 ENCOUNTER — Telehealth: Payer: Self-pay | Admitting: Pulmonary Disease

## 2017-06-12 ENCOUNTER — Encounter: Payer: Self-pay | Admitting: Cardiology

## 2017-06-12 VITALS — BP 126/63 | HR 72 | Ht 64.75 in | Wt 216.0 lb

## 2017-06-12 DIAGNOSIS — Z8249 Family history of ischemic heart disease and other diseases of the circulatory system: Secondary | ICD-10-CM

## 2017-06-12 DIAGNOSIS — E785 Hyperlipidemia, unspecified: Secondary | ICD-10-CM

## 2017-06-12 DIAGNOSIS — R0609 Other forms of dyspnea: Secondary | ICD-10-CM

## 2017-06-12 DIAGNOSIS — R002 Palpitations: Secondary | ICD-10-CM | POA: Diagnosis not present

## 2017-06-12 DIAGNOSIS — I10 Essential (primary) hypertension: Secondary | ICD-10-CM

## 2017-06-12 NOTE — Progress Notes (Signed)
PCP: [name], MD  Clinic Note: No chief complaint on file.   HPI: Erin Myers is a 77 y.o. female with a PMH below who presents today for almost 2-year follow-up for dyspnea.. She is being seen at the request of [name], MD. --From February 11 visit, was noted to have oxygen desaturation to 87% after walking with a baseline of 96% returns to baseline in 1 minute. From January 24 evaluation was started on Levaquin. -Plan is for chest CT evaluation.  Erin Myers was last seen on Aug 11, 2015 as a 2-year follow-up for palpitations and some exertional dyspnea/edema.  She had noted that she was not able to do the same amount of activity that she had been done before.  Palpitations were relatively well controlled with only rare fleeting episodes. -->  She was evaluated with a GXT noted below there was read as low risk.  Recent Hospitalizations: None - Seen 3/19 for  Persistent  PNA  Studies Personally Reviewed - (if available, images/films reviewed: From Epic Chart or Care Everywhere)  05/24/17 Echo:  Normal LV Size with mild LVH. Systolic function was w/ EF ~65-70%. No RWMA. Gr 1 DD. PAP mild-moderately increased ~41 mmHg   Interval History: Erin Myers presents here today still noting this persistent dyspnea we will also gurgling, rattling sound in her throat and lungs when she breathes.  She really feels like she is a hard time catching her breath because of this rattling sensation.  His if she just cannot get a good breath in.  That feels uncomfortable in her throat but she does not have any chest tightness or pressure.  She is finishing a prednisone taper this been going on for a long time and she feels quite bloated and puffy be is because of it.  She does not really notice any edema or PND, orthopnea --sleeps with a wedge due to back pain and this rattling sensation in her throat,  Not because of orthopnea She still says her palpitations are relatively rare  and not overly bothersome.  Continue on her 75 mg twice daily Toprol doing well.  Most of her chest discomfort is related to when she has coughing spells but she has not had any those recently.  She really has not any had any fevers or chills either. Still uses CPAP.    She not had any lightheadedness, dizziness or wooziness, syncope/near syncope No TIA or amaurosis fugax.  No claudication.   ROS: A comprehensive was performed. Review of Systems  Constitutional: Positive for malaise/fatigue. Negative for chills, fever and weight loss (No real loss, but may be some gain).  HENT: Negative for congestion and nosebleeds.   Respiratory: Positive for cough (Improving gradually), shortness of breath and wheezing.   Cardiovascular: Negative for palpitations.  Gastrointestinal: Negative for blood in stool, constipation, diarrhea, heartburn and melena.  Genitourinary: Negative for dysuria and hematuria.  Musculoskeletal: Positive for back pain (Chronic back pain with sciatica; right-sided sacroiliac pain) and joint pain (Mild arthritis pains). Negative for myalgias.  Neurological: Positive for dizziness (Per HPI). Negative for weakness.  Psychiatric/Behavioral: Negative for memory loss. The patient is nervous/anxious. The patient does not have insomnia.   All other systems reviewed and are negative.  I have reviewed and (if needed) personally updated the patient's problem list, medications, allergies, past medical and surgical history, social and family history.   Past Medical History:  Diagnosis Date  . Anxiety   . Diabetes mellitus without complication ([place])   .  Dyslipidemia   . HTN (hypertension)   . Hyperlipemia   . Hypothyroid   . Hypothyroidism   . 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 Obesity   . Ovarian cancer ([place])   . Palpitations 2009 and 2006   Negative nuclear stress test   . Sleep apnea    on C-pap    Past Surgical History:  Procedure Laterality Date   . ABDOMINAL HYSTERECTOMY  2001  . GRADED EXERCISE TOLERANCE TEST  08/2015    Blood pressure demonstrated a hypertensive response to exercise.  There was less than 0.62mm of horizontal ST segment depression in the inferolateral leads. No diagnostic criterial for ischemia. Moderately impaired exercise tolerance. The patient achieved 5.8 mets. This is a low risk study.  . LOBECTOMY  2000   RLL  . NM MYOCAR PERF WALL MOTION  08/20/2007   EF 74%  EXERCISE CAPACITY 7 METS  . OOPHORECTOMY  2001  . TONSILLECTOMY      Current Meds  Medication Sig  . aspirin 81 MG tablet Take 81 mg by mouth daily.  . clorazepate (TRANXENE) 7.5 MG tablet Take 7.5 mg by mouth 2 (two) times daily as needed for anxiety.  [name] levothyroxine (SYNTHROID, LEVOTHROID) 112 MCG tablet Take 112 mcg by mouth daily before breakfast.  . loratadine (CLARITIN) 10 MG tablet Take 10 mg by mouth daily.  [name] losartan-hydrochlorothiazide (HYZAAR) 50-12.5 MG per tablet   . metoprolol succinate (TOPROL-XL) 50 MG 24 hr tablet Take 75 mg by mouth 2 (two) times daily.   . predniSONE (DELTASONE) 20 MG tablet Take 1 2/dayx5day,1x5day,1/2in AM until return in 43month  . rosuvastatin (CRESTOR) 10 MG tablet Take 10 mg by mouth daily.  -She actually takes the metoprolol succinate 75 mg in the morning and 25 mg in the evening.  Allergies  Allergen Reactions  . Adhesive [Tape]   . Codeine   . Lactose Intolerance (Gi) Diarrhea  . Sulfa Antibiotics     Social History   Tobacco Use  . Smoking status: Former Smoker    Packs/day: 0.75    Years: 30.00    Pack years: 22.50    Types: Cigarettes    Last attempt to quit: 08/18/1991    Years since quitting: 25.8  . Smokeless tobacco: Never Used  Substance Use Topics  . Alcohol use: No    Alcohol/week: 0.0 oz  . Drug use: No   Social History   Social History Narrative   1-2 glasses of tea a day    Married mother of 2 (daughter close to 31 and son 45-29 years old).  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    family history includes CVA in her father; CVA (age of onset: 76) in her brother; Coronary artery disease (age of onset: 34) in her mother; Heart attack in her mother; Suicidality in her sister.  Wt Readings from Last 3 Encounters:  06/12/17 216 lb (98 kg)  05/30/17 216 lb 3.2 oz (98.1 kg)  05/09/17 217 lb 9.6 oz (98.7 kg)    PHYSICAL EXAM BP 126/63   Pulse 72   Ht 5' 4.75" (1.645 m)   Wt 216 lb (98 kg)   SpO2 (!) 87%   BMI 36.22 kg/m  Physical Exam  Constitutional: She is oriented to person, place, and time. She appears well-developed and well-nourished. No distress.  Moderately obese.  Well-groomed  HENT:  Head: Normocephalic  and atraumatic.  Mouth/Throat: No oropharyngeal exudate.  Mild facial puffiness due to steroids  Neck: Normal range of motion. Neck supple. No hepatojugular reflux and no JVD present. Carotid bruit is not present.  Cardiovascular: Normal rate, regular rhythm, normal heart sounds and intact distal pulses. PMI is not displaced (Cannot palpate). Exam reveals no gallop and no friction rub.  No murmur heard. Pulmonary/Chest: Effort normal. No respiratory distress. She has no wheezes. She has no rales. She exhibits no tenderness.  Bilateral, right more than left basal diminished sounds with diffuse, coarse rhonchi and dullness to percussion.  Abdominal: Soft. Bowel sounds are normal. She exhibits no distension. There is no tenderness.  Obese; no HSM or HJR  Musculoskeletal: Normal range of motion. She exhibits no edema (Trivial only).  Lymphadenopathy:    She has no cervical adenopathy.  Neurological: She is alert and oriented to person, place, and time.  Psychiatric: She has a normal mood and affect. Her behavior is normal. Judgment and thought content normal.  Nursing note and vitals reviewed.   Adult ECG Report  Rate: 63;  Rhythm: normal sinus rhythm and Normal axis, intervals  and durations;   Narrative Interpretation: Normal EKG  Other studies Reviewed: Additional studies/ records that were reviewed today include:  Recent Labs: Labs unavailable   ASSESSMENT / PLAN: Unfortunately, it does not appear to that her echocardiogram is all that helpful for evaluating her because of dyspnea.  Her symptoms are not consistent with angina or heart failure.  I think her symptoms are clearly related to some type of pulmonary issue that is being evaluated and managed by pulmonologist.  No findings to suggest pulmonary hypertension either.  I do suspect that she can have some baseline dyspnea because of obesity and deconditioning, but that does not explain the rattling sensation in coarse rhonchi of the chest.  Recommend continued ongoing evaluation, consider high-resolution CT of the chest.  But probably does not require further cardiac evaluation at this time.  Blood pressures well controlled as is her edema. Palpitations are well controlled on current staggered dose of beta-blocker. Lipid be managed by PCP on statin.  Problem List Items Addressed This Visit    Palpitations (Chronic)   Family history of coronary artery disease (Chronic)   Essential hypertension (Chronic)   Dyspnea - Primary (Chronic)   Dyslipidemia, goal LDL below 130 (Chronic)      Current medicines are reviewed at length with the patient today. (+/- concerns) none The following changes have been made:None  Patient Instructions  Medication Instructions:  No medication changes   Follow-Up: Your physician recommends that you schedule a follow-up appointment as needed.   If you need a refill on your cardiac medications before your next appointment, please call your pharmacy.   Studies Ordered:   No orders of the defined types were placed in this encounter.     Glenetta Hew, M.D., M.S. Interventional Cardiologist   Pager # [phone] Phone # [phone] [address]  [place], [place] [address]   Thank you for choosing [facility] at [facility]!!

## 2017-06-12 NOTE — Patient Instructions (Addendum)
Medication Instructions:  No medication changes   Follow-Up: Your physician recommends that you schedule a follow-up appointment as needed.   If you need a refill on your cardiac medications before your next appointment, please call your pharmacy.

## 2017-06-13 NOTE — Telephone Encounter (Signed)
Per SN- Move setting to 600. She can use it as practical,4-6 times per day,use while watching TV. The more she uses the better. Called and spoke with her about SN recommendations.  She stated understanding.  Nothing further needed.

## 2017-06-13 NOTE — Telephone Encounter (Signed)
Called and spoke with patient, she states that she has been using her spirometer but had some questions.   She is wanting to know what setting it needs to be on, she now has it on 400. How many times a day she needs to use it What is the wait time in between each usage.    SN please advise on this, thanks.

## 2017-06-15 DIAGNOSIS — G4733 Obstructive sleep apnea (adult) (pediatric): Secondary | ICD-10-CM | POA: Diagnosis not present

## 2017-07-03 ENCOUNTER — Other Ambulatory Visit: Payer: PPO

## 2017-07-03 ENCOUNTER — Encounter: Payer: Self-pay | Admitting: Pulmonary Disease

## 2017-07-03 ENCOUNTER — Ambulatory Visit (INDEPENDENT_AMBULATORY_CARE_PROVIDER_SITE_OTHER): Payer: PPO | Admitting: Pulmonary Disease

## 2017-07-03 ENCOUNTER — Ambulatory Visit (INDEPENDENT_AMBULATORY_CARE_PROVIDER_SITE_OTHER)
Admission: RE | Admit: 2017-07-03 | Discharge: 2017-07-03 | Disposition: A | Discharge status: Home / Self Care | Payer: PPO | Source: Ambulatory Visit | Attending: Pulmonary Disease | Admitting: Pulmonary Disease

## 2017-07-03 VITALS — BP 128/70 | HR 80 | Temp 98.0°F | Ht 65.0 in | Wt 213.4 lb

## 2017-07-03 DIAGNOSIS — I10 Essential (primary) hypertension: Secondary | ICD-10-CM | POA: Diagnosis not present

## 2017-07-03 DIAGNOSIS — R0602 Shortness of breath: Secondary | ICD-10-CM | POA: Diagnosis not present

## 2017-07-03 DIAGNOSIS — E039 Hypothyroidism, unspecified: Secondary | ICD-10-CM

## 2017-07-03 DIAGNOSIS — G4733 Obstructive sleep apnea (adult) (pediatric): Secondary | ICD-10-CM | POA: Diagnosis not present

## 2017-07-03 DIAGNOSIS — J181 Lobar pneumonia, unspecified organism: Secondary | ICD-10-CM | POA: Diagnosis not present

## 2017-07-03 DIAGNOSIS — R05 Cough: Secondary | ICD-10-CM | POA: Diagnosis not present

## 2017-07-03 DIAGNOSIS — R0600 Dyspnea, unspecified: Secondary | ICD-10-CM | POA: Diagnosis not present

## 2017-07-03 DIAGNOSIS — R002 Palpitations: Secondary | ICD-10-CM | POA: Diagnosis not present

## 2017-07-03 DIAGNOSIS — J189 Pneumonia, unspecified organism: Secondary | ICD-10-CM

## 2017-07-03 DIAGNOSIS — Z9989 Dependence on other enabling machines and devices: Secondary | ICD-10-CM

## 2017-07-03 DIAGNOSIS — E669 Obesity, unspecified: Secondary | ICD-10-CM | POA: Diagnosis not present

## 2017-07-03 DIAGNOSIS — Z859 Personal history of malignant neoplasm, unspecified: Secondary | ICD-10-CM | POA: Diagnosis not present

## 2017-07-03 DIAGNOSIS — R0902 Hypoxemia: Secondary | ICD-10-CM | POA: Diagnosis not present

## 2017-07-03 DIAGNOSIS — Z8659 Personal history of other mental and behavioral disorders: Secondary | ICD-10-CM

## 2017-07-03 MED ORDER — BUDESONIDE-FORMOTEROL FUMARATE 160-4.5 MCG/ACT IN AERO: 2.0000 | INHALATION_SPRAY | Freq: Two times a day (BID) | RESPIRATORY_TRACT | 6 refills | Status: DC

## 2017-07-03 MED ORDER — IPRATROPIUM-ALBUTEROL 0.5-2.5 (3) MG/3ML IN SOLN: 3.0000 mL | Freq: Three times a day (TID) | RESPIRATORY_TRACT | 2 refills | Status: DC

## 2017-07-03 MED ORDER — ALBUTEROL SULFATE (2.5 MG/3ML) 0.083% IN NEBU
2.5000 mg | INHALATION_SOLUTION | Freq: Once | RESPIRATORY_TRACT | Status: AC
  Administered 2017-07-03: 2.5 mg via RESPIRATORY_TRACT

## 2017-07-03 MED ORDER — PREDNISONE 10 MG PO TABS: 10.0000 mg | ORAL_TABLET | Freq: Every day | ORAL | 0 refills | Status: DC

## 2017-07-03 NOTE — Patient Instructions (Addendum)
Today we updated your med list in our EPIC system...    Continue your current medications the same...  I am surprised & disappointed that your condition has not improved w/ the Prednisone...  Today we did a follow up CXR & some additional blood work w/ an IgE level & RAST test... ... We also performed an Ambulatory Oximetry test & ordered an Overnight Oximetry test...     We will contact you w/ the results when available...   We gave you a NEBULIZER treatment, and decided to prescribe a home NEB machine to use 3 times daily w/ Duoneb medication in the machine...  We have also prescribed an inhaler-- SYMBICORT160- 2 sprays twice daily after the 1st & last NEB treatments daily...  Keep the Prednisone at 10mg  each AM for the time being...   Finally I want you to use the OTC MUCINEX 1200mg  twice daily w/ plenty of water by mouth (the 1200mg  tabs are pretty big- so OK to use two of the 600mg  pills or break a 1200 in half to make it easier to swallow)...  Continue your Incentive device/ lung exerciser, and work to cough & expectorate the phlegm...  Call for any questions...  Let's plan a follow up visit in 23mo, sooner if needed for problems.Marland KitchenMarland Kitchen

## 2017-07-03 NOTE — Progress Notes (Signed)
Subjective:     Patient ID: Erin Myers, female   DOB: Sep 22, 1940, 77 y.o.   MRN: [id number]  HPI  ~  May 14, 2015:  Initial pulmonary consult by SN>   89 y/o WF referred by DrAthar- Neurology,  for a pulmonary evaluation due to dyspnea; her PCP is Dr. Jeanine Luz relates a hx of SOB dating back ~77yr; it appears to be quite variable- sometimes occuring w/ exertion & other times when she is just resting; It is described as a feeling of not being able to get a deep breath, can't get the air "IN", and not satisfied breathing "I can't get oxygen"; she had a URI in [id number] w/ cough which has mostly resolved but her intermittent SOB is unchanged; she also has a discomfort in her upper chest; she notes that DrPaterson has done CXRs and Spirometry in the past- she was told they were OK; she has been under a lot of stress- husb ill, out of work, PTSD...  Smoking Hx>  She is an ex-smoker, starting at 67, smoked for 66yrs up to 1ppd, quit in 1993 when she was around grandchildren...  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  Medical Hx>  Hx HBP, palpitations, HL, DM (diet controlled), Obesity, Hypothyroid, colon polyps/ divertics/ hems, Ovarian Ca, Anxiety & PTSD... She has prev seen DrLittle for the palpit (now DrHarding).  Family Hx>  Neg for hx respiratory problems; pos for heart disease & stroke...  Occup Hx>  Former Advertising copywriter; No known exposures to asbestos, silica dust, other inorganic or organic dusts, etc...  Current Meds>  ASA81, ToprolXL50-3/d, Losar50, Crestor10, Synthroid112, Tranxene7.5- taking 1/2Bid...  EXAM shows Afeb, VSS, O2sat=97% on RA; wt=215#,  5'5"Tall, BMI=36;  HEENT- neg, mallampati2;  Chest- clear w/o w/r/r;  Heart- RR w/o m/r/g;  Abd- soft, nontender, neg;  Ext- neg w/o c/c/e;  Neuro- intact w/o focal abn...  CXR 05/14/15>  Norm heart size, post surg changes on right, clear lungs, NAD...  Spirometry 05/14/15>  FVC=2.42 (82%), FEV1=1.86 (83%), %1sec=77%, mid-flows are wnl at 84% predicted... This is a normal spirometry w/ lung volumes at lower lim of norm...  Ambulatory oximetry 05/14/15>  O2sat=95% on RA at rest;  She ambulated 3 laps in the office w/ lowest O2sat=93% w/ max HR=94/min... Note- she had a neg Myoview by Cards in 2009.                    CXR 05/14/15    IMP >>     Dyspnea>  This is likely multifactorial w/ major components from anxiety, deconditioning, sedentary lifestyle, obesity...    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    OSA on CPAP>  eval & management by DrAthar for Neurology...    Ex-smoker, quit 1993, 30 pack-yr smoking hx    CARDIAC issues>  HBP & Palpitations followed by DrHarding...    MEDICAL issues>  HBP, palpitations, HL, DM (diet controlled), Obesity, Hypothyroid, colon polyps/ divertics/ hems, Ovarian Ca, Anxiety & PTSD PLAN >>     I spent some time in the office explaining how "chest  wall musc spasm" produces the SOB sensation of not getting enough air "IN" etc; she has been on Tranxene which clearly is not working so I proposed a change to a "combination relaxer" like KLONOPIN 0.5mg  Bid... She will let me know how this is working for her so we can make any dose adjustments over the phone, and we plan ROV recheck in about 6 weeks...   ~  May 02, 2017:  23 month ROV & pulmonary follow up visit>  Erin Myers did not return for follow up after her initial consult w/ me in Mar2017, and she reports that she didn't even fill the prescription for Klonopin that I prescribed on that occasion => see above problem list...  Epic Chart Review>>    She saw  [facility]-DrHarding 08/11/15>  Palpit, dyspnea, edema; on MetoprololER-75mg Bid, Hyzaar50-12.5, ASA81, Cres10; Exam showed lungs were clear bilat, & heart regular w/o m/r/g; pulses intact & no edema noted; dyspnea felt to be from obesity & deconditioning; they did a GXT 08/2015> Blood pressure demonstrated a hypertensive response to exercise; There was less than 0.46mm of horizontal ST segment depression in the inferolateral leads. No diagnostic criterial for ischemia. Moderately impaired exercise tolerance. The patient achieved 5.8 mets. This is a low risk study. (Pt asked to f/u 62yr).    She had mult visits to [facility] 03/2016 for her right sided LBP & siatica>  Notes reviewed...     She was evaluated & treated for dizziness/ BPPV/ & tinnitus 05/2016 by [facility] and Audiology>   Treated w/ Epley maneuvers & their notes are reviewed...     She saw PCP-DrPaterson 04/24/17>  C/o persistent cough, chest congestion & DOE; she had recently been treated w/ Levaquin500x7d; denies f/c;  Exam showed rales bilat ~1/2 way up; CXR (reviewed on disc from DrPaterson's office) showed RUL inhomogeneous opacity c/w pneumonia and sl inc interstitial markings in right base;      Currently she is c/o cough w/ beige sputum, no hemoptysis, notes a slight "crackling" in throat, and SOB described as a difficulty getting a deep breath & occurs w/ activity & when stressed; she says she still has "some anxiety" but decided on her own to NOT try the Klonopin I prescribed at out 1st OV 05/2015...  Her other chief complaint is "siatica" back pain/ hip pain- eval by Ortho DrBrooks & he sent her for physical therapy/ water exercises/ etc... EXAM shows Afeb, VSS, O2sat=94% on RA; wt=217#, 5'5"Tall, BMI=36;  HEENT- neg, mallampati2;  Chest- bibasilar crackles ~1/3rd way up, no wheezing, rhonchi, consolidation;  Heart- RR w/o m/r/g;  Abd- soft, nontender, neg;  Ext- neg w/o c/c/e;  Neuro- intact w/o focal abn...  CXR 05/02/17> norm  heart size, inhomogeneous opac in RUL c/w pneumonia, RLL scarring & vol loss from prev surg, left lung is clear...   Spirometry 05/02/17> very poor tracing- numbers generated are physiologic...  Ambulatory Oximetry 05/02/17>  O2sat=94% on RA at rest w/ pulse=65/min;  She ambulated on RA for 2 laps (185' ea) & stopped for hip/leg pain w/ lowest O2sat=88% w/ pulse=93/min...   LABS 05/02/17>  Chems- ok x HCO3=35, BS=106, LFTs wnl;  CBC- WBC=11.3, Hg=17.1;  TSH=0.49;  RA=NEG,  ANA=NEG,  Sed=13                     CXR 05/02/17   IMP/PLAN>>  Erin Myers appears to have a more focal RUL pulmonary process, prob atypical pneumonia & no signs of a more  diffuse pulm disease; prelim collagen vasc studies are NEG as well; we discussed additional Rx in light of her persistent CXR changes and her symptoms-- rec to start DOXY100mg Bid x10d and a Prednisone20mg  tabs- slow taper over 2-3 weeks (see AVS instructions); DrPaterson has already ordered a CT Chest for 05/12/17 & we will review the scan when done & discuss f/u visit after that... She has Chlorazepate for anxiety (7.5mg  Bid) and is still not inclined to switch to Klonopin rx...       NOTE:  >50% of this 9min ROV was spent in counseling & coordination of care...   ~  ADDENDUM>> CT Chest 05/12/17>>  Norm heart size, minor atherosclerotic plaque in Ao & none in coronaries; no adenopathy; airsp opac in RUL posteriorly w/ GGO & intervening areas of centrilob emphysema, some post-op scarring in right base; no lung nodules, masses, effusion, etc...  I called pt w/ report & discussed the focal nature of the opac, likely from atyp pneumonia and slow resolution due to XRay lag-time; we plan to finish Rx w/ doxy & slow Pred taper; add Mucinex 1200mg  bid + fluids & ROV w/ CXR in 56month...    ~  May 30, 2017:  31mo ROV & when seen 05/02/17 Erin Myers appeared to have an atypical RUL pneumonia & lack of roentgenographic improvement chalked up to prob XRay "lag-time";  Her initial  therapy by DrPaterson w/ Levaquin was supplemented w/ Doxy & Mucinex + a brief course of Pred;  She has a CT Chest done on 05/12/17> predom finding was airsp dis in RUL posteriorly & some scarring (likely post-op) at right base;  According to pt & her husb she seemed to improve on the Pred, less cough & congestion, less SOB & she started walking again;  But when the Pred ran out she feels she started downhill again, 4d ago had chills, temp to 100.3, incr cough & whitish/beige sput w/o hemoptysis,and incr in SOB- again feeling like she can't get a deep breath, she reduced her exercise to just walking around in the house...    She had f/u visit w/ [facility]- DrHarding on 05/09/17>  F/u for dyspnea- hx palpit & some edema, EKG was wnl & prev GXT in 2017 was neg for ischemia;  BP & palpit controlled on Metoprolol & Hyzaar; she also remains on statin & ASA, she remains obese & hasn't lost any weight;  No change in meds and they ordered a 2DEcho> done 05/24/17>  LV showed mild LVH, vigorous LVF w/ EF=65-70%, no regional wall motion abn, there was Gr1DD, valves ok, atria ok, RV size & function wnl, but PAsys=54mmHg... She is on CPAP from DrAthar for OSA w/ sleep study 12/2014 showing AHI=26/hr & O2 desaturations;  subseq CPAP titration 01/2015 showed optimal CPAP=11cmH2O;  A follow up ONO 02/2015 while on CPAP showed O2desat to 73% & time below 88% was >4H therefore O2 was ordered to bleed into the CPAP qhs;  CPAP compliance date was checked 03/2015 w/ excellent compliance & resid AHI=1.1/hr & no signif air leak... She has not seen DrAthar in >6yrs now => she has apparently NOT been using O2 for the last 11yrs but does still use her CPAP nightly (DME is ?)...    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    MEDICAL issues>  HL (Cres10), DM (diet controlled), hypothy ([id number]), obesity, colon polyps/ divertics/ hems, Hx ovarian ca, anxiety (Tranxene7.5) & PTSD EXAM shows Afeb, VSS, O2sat=90% on RA; wt=216#, 5'5"Tall, BMI=36;  HEENT- neg, mallampati2;  Chest- bibasilar crackles ~1/3rd way up, no wheezing, +few rhonchi, no consolidation;  Heart- RR w/o m/r/g;  Abd- soft, nontender, neg;  Ext- tr edema, neg w/o c/c;  Neuro- intact w/o focal abn...  CXR 05/30/17 (independently reviewed by me in the PACS system) showed persistent airsp consolidation in RUL c/w pneumonia, no real change from older films in Feb...  CPAP download => pending IMP/PLAN>>  Her baseline CXR in 2017 showed some post-op scarring in right base, otherw clear; next films were 2/19 w/ RUL pneumonia that has been slow to clear (?related to XRay lag-time vs other reason?); complicating problems include her hx of RLL resection for carcinoid tumor in 2000, OSA on CPAP, nocturnal hypoxemia despite the CPAP & her decision NOT to use the O2, secondary pulm HTN per 2DEcho, and her multifactorial dyspnea... REC to treat w/ IS lung exerciser, longer course of oral Pred, GFN400-2Tid + fluids; we need CPAP download & another ONO to assess these needs vis-a-vis her pulmHTN...    ~  July 03, 2017:  22mo ROV & Erin Myers says she still feels SOB & notes persistent rattling in her chest; last OV we restarted PRED w/ slower taper down to 10mg  Qam at this time but she reports min improvement & exam shows worsening chest congestion, some cough, rhonchi & wheezing at both lung bases; despite this a f/u CXR shows similar findings on the right but left lung is relatively clear...    She had a f/u  visit w/ [facility]- DrHarding 06/12/17> f/u dyspnea- c/o persistent SOB, chest congestion & gurgling; notes inability to get a deep breath in; he felt that obesity 7 deconditioning were definite factors in her SOB & he suggested Cards f/u prn...     Problem List:      RUL Pneumonia (nos) 04/2017, XRay slow to clear- (r/o XRay lag-time or other process like COP)> treated w/ several antibiotics from DrPaterson & myself...    Persistent chest congestion/ rhonchi/ wheezing suggestive of refractory asthmatic bronchitis> we treated w/ antibiotics, Pred, and adding NEBS, Symbicort, Mucinex, etc...    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    Nocturnal hypoxemia despite CPAP (identified in 2017), but pt stopped O2 on her own> we have ordered another ONO & rec re-starting nocturnal oxygen therapy...    Exercise hypoxemia (identified 06/2017)> we have ordered an ambulatory OXYGEN system to use at 2L/min    Pulmonary hypertension (PAsys~est 59mmHg on 2DEcho 05/2017) , likely secondary (OSA, hypoxemia, obesity, ?other)> treatment approach includes rx of underlying factors- CPAP, oxygen, diet/ exercise/ wt reduction).    Dyspnea- likely multifactorial w/ PAH, OSA, hypoxemia, pneumonia, deconditioning, sedentaty, anxiety all playing a roll> she has Tranxene7.5 taking 1/2 bid prn & asked to incr to 1Bid or consider change to Klonopin.    CARDIAC issues>  HBP (Metop75Bid, Hyzaar 50-12.5), palpit, Gr1DD, PAH...    MEDICAL issues>  HL (Cres10), DM (diet controlled), hypothy ([id number]), obesity, colon polyps/ divertics/ hems, Hx ovarian ca, anxiety (Tranxene7.5) & PTSD EXAM shows Afeb, VSS, O2sat=92% on RA; wt=213#, 5'5"Tall, BMI=36;  HEENT- neg, mallampati2;  Chest- bibasilar crackles ~1/2 way up, +rhonchi & exp wheezing, no consolidation;  Heart- RR w/o m/r/g;  Abd- soft, nontender, neg;  Ext- tr edema, neg w/o  c/c;  Neuro- intact w/o focal abn...  CPAP download 3/23 - 07/02/17>  Excellent compliance (30/30 days, 7-8H per night), set at 11cmH20 pressure, only mild leak, and AHI~2/hr  CXR 07/03/17 (independently reviewed by me in the PACS system) shows norm heart size, left lung remains clear, RUL opac is similar & persistent, so is the scarring & vol loss at right base; no evid of progressive or new findings  Ambulatory Oximetry Test 07/03/17>  O2sat=93% on RA at rest w/ pulse=87/min;  She walked only 1 lap in office (185') w/ drop in O2sat to 82% w/ pulse=90/min, and improved to 94% w/ 2L/min O2 by [place]...  LABS 07/03/17>  They wanted IgE & RAST panel to r/o "allergies">  IgE=17 & RAST panel totally NEG...   She will need ONO on CPAP & RA to qualify for nocturnal O2 => pending  She also needs Hi-res CT Chest to eval for focal vs more diffuse ILD problem => pending IMP/PLAN>>  We discussed her situation & problem list as above- concern for more diffuse lung prob but XRay still shows predom right sided process-- Hi-res CT Chest pending & in the meanwhile we will Rx w/ continued Pred10mg /d, O2 at 2L/min by [place], continue CPAP nightly & check ONO on CPAP and RA;  Her exam is very congested bilat w/ wheezing & rhonchi at both bases=> start NEBULIZER w/ Duoneb Tid followed by Symbicort160-2spBid; add MUCINEX 1200mg  bid w/ fluids;  Continue the IS device & work to expectorate the phlegm...    Past Medical History:  Diagnosis Date  . Anxiety   . Diabetes mellitus without complication ([place])   . Dyslipidemia   . HTN (hypertension)   . Hyperlipemia   . Hypothyroid   . Hypothyroidism   . Lung cancer ([place]) 2000   Carcinoid, s/p RLL lobectomy  . Obesity   . Ovarian cancer ([place])   . Palpitations 2009 and 2006   Negative nuclear stress test   . Sleep apnea    on C-pap    Past Surgical History:  Procedure Laterality Date  . ABDOMINAL HYSTERECTOMY  2001  . GRADED EXERCISE TOLERANCE TEST  08/2015    Blood  pressure demonstrated a hypertensive response to exercise.  There was less than 0.44mm of horizontal ST segment depression in the inferolateral leads. No diagnostic criterial for ischemia. Moderately impaired exercise tolerance. The patient achieved 5.8 mets. This is a low risk study.  . LOBECTOMY  2000   RLL  . NM MYOCAR PERF WALL MOTION  08/20/2007   EF 74%  EXERCISE CAPACITY 7 METS  . OOPHORECTOMY  2001  . TONSILLECTOMY      Outpatient Encounter Medications as of 07/03/2017  Medication Sig  . aspirin 81 MG tablet Take 81 mg by mouth daily.  . clorazepate (TRANXENE) 7.5 MG tablet Take 7.5 mg by mouth 2 (two) times daily as needed for anxiety.  [name] levothyroxine (SYNTHROID, LEVOTHROID) 112 MCG tablet Take 112 mcg by mouth daily before breakfast.  . loratadine (CLARITIN) 10 MG tablet Take 10 mg by mouth  daily. As needed  . losartan-hydrochlorothiazide (HYZAAR) 50-12.5 MG per tablet   . metoprolol succinate (TOPROL-XL) 50 MG 24 hr tablet Take 75 mg by mouth 2 (two) times daily.   . predniSONE (DELTASONE) 20 MG tablet Take 1/2 tab daily   . rosuvastatin (CRESTOR) 10 MG tablet Take 10 mg by mouth daily.   No facility-administered encounter medications on file as of 07/03/2017.     Allergies  Allergen Reactions  . Adhesive [Tape]   . Codeine   . Lactose Intolerance (Gi) Diarrhea  . Sulfa Antibiotics     Current Medications, Allergies, Past Medical History, Past Surgical History, Family History, and Social History were reviewed in [employer] record.   Review of Systems             All symptoms NEG except where BOLDED >>  Constitutional:  F/C/S, fatigue, anorexia, unexpected weight change. HEENT:  HA, visual changes, hearing loss, earache, nasal symptoms, sore throat, mouth sores, hoarseness. Resp:  cough, sputum, hemoptysis; SOB, tightness, wheezing. Cardio:  CP, palpit, DOE, orthopnea, edema. GI:  N/V/D/C, blood in stool; reflux, abd pain, distention, gas. GU:   dysuria, freq, urgency, hematuria, flank pain, voiding difficulty. MS:  joint pain, swelling, tenderness, decr ROM; neck pain, back pain, etc. Neuro:  HA, tremors, seizures, dizziness, syncope, weakness, numbness, gait abn. Skin:  suspicious lesions or skin rash. Heme:  adenopathy, bruising, bleeding. Psyche:  confusion, agitation, sleep disturbance, hallucinations, anxiety, depression suicidal.   Objective:   Physical Exam       Vital Signs:  Reviewed...   General:  WD, overweight, 77 y/o WF in NAD; alert & oriented; pleasant & cooperative... HEENT:  [place]/AT; Conjunctiva- pink, Sclera- nonicteric, EOM-wnl, PERRLA, EACs-clear, TMs-wnl; NOSE-clear; THROAT-clear & wnl.  Neck:  Supple w/ fair ROM; no JVD; normal carotid impulses w/o bruits; no thyromegaly or nodules palpated; no lymphadenopathy.  Chest:  Clear to P & A; without wheezes, rales, or rhonchi heard. Heart:  Regular Rhythm; norm S1 & S2 without murmurs, rubs, or gallops detected. Abdomen:  Obese, soft & nontender- no guarding or rebound; normal bowel sounds; no organomegaly or masses palpated. Ext:  Normal ROM; without deformities +arthritic changes; no varicose veins, +venous insuffic, or edema;  Pulses intact w/o bruits. Neuro:  CNs II-XII intact; motor testing normal; sensory testing normal; gait normal & balance OK. Derm:  No lesions noted; no rash etc. Lymph:  No cervical, supraclavicular, axillary, or inguinal adenopathy palpated.   Assessment:      IMP >>     04/2017-- prob RUL atypical pneumonia, Rx'd w/ Levaquin=> Doxy/ Pred...    Dyspnea>  This is likely multifactorial w/ major components from anxiety, deconditioning, sedentary lifestyle, obesity; I suggested Klonopin 0.5mg  Bid but she prefers her Clorazepate 7.5mg  Bid.    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 F/u CT Chest sched for 05/12/17- pending    OSA on CPAP>  eval & management by DrAthar for Neurology...     Spirometry & O2sats were all wnl => she has since developed hypoxemia    Ex-smoker, quit 1993, 30 pack-yr smoking hx    CARDIAC issues>  HBP & Palpitations followed by DrHarding...    MEDICAL issues>  HBP, palpitations, HL (on Cres10), DM (diet controlled), Obesity (unsuccessful weight loss attempts), Hypothyroid (on Synthroid112), colon polyps/ divertics/ hems, Ovarian Ca, Anxiety & PTSD (on Chlorazepate)  PLAN >>  05/14/15>   I spent some time in the office  explaining how "chest wall musc spasm" produces the SOB sensation of not getting enough air "IN" etc; she has been on Tranxene which clearly is not working so I proposed a change to a "combination relaxer" like KLONOPIN 0.5mg  Bid... She will let me know how this is working for her so we can make any dose adjustments over the phone, and we plan ROV recheck in about 6 weeks... 05/02/17>   Erin Myers appears to have a more focal RUL pulmonary process, prob atypical pneumonia & no signs of a more diffuse pulm disease; prelim collagen vasc studies are NEG as well; we discussed additional Rx in light of her persistent CXR changes and her symptoms-- rec to start DOXY100mg Bid x10d and a Prednisone20mg  tabs- slow taper over 2-3 weeks (see AVS instructions); DrPaterson has already ordered a CT Chest for 05/12/17 & we will review the scan when done & discuss f/u visit after that... She has Chlorazepate for anxiety (7.5mg  Bid) and is still not inclined to switch to Klonopin rx...  05/30/17>   Her baseline CXR in 2017 showed some post-op scarring in right base, otherw clear; next films were 2/19 w/ RUL pneumonia that has been slow to clear (?related to XRay lag-time vs other reason?); complicating problems include her hx of RLL resection for carcinoid tumor in 2000, OSA on CPAP, nocturnal hypoxemia despite the CPAP & her decision NOT to use the O2, secondary pulm HTN per 2DEcho, and her multifactorial dyspnea... REC to treat w/ IS lung exerciser, longer course of oral  Pred, GFN400-2Tid + fluids; we need CPAP download & another ONO to assess these needs vis-a-vis her pulmHTN 07/03/17>   We discussed her situation & problem list as above- concern for more diffuse lung prob but XRay still shows predom right sided process-- Hi-res CT Chest pending & in the meanwhile we will Rx w/ continued Pred10mg /d, O2 at 2L/min by [place], continue CPAP nightly & check ONO on CPAP and RA;  Her exam is very congested bilat w/ wheezing & rhonchi at both bases=> start NEBULIZER w/ Duoneb Tid followed by Symbicort160-2spBid; add MUCINEX 1200mg  bid w/ fluids;  Continue the IS device & work to expectorate the phlegm   Plan:     Patient's Medications  New Prescriptions   BUDESONIDE-FORMOTEROL (SYMBICORT) 160-4.5 MCG/ACT INHALER    Inhale 2 puffs into the lungs 2 (two) times daily.   IPRATROPIUM-ALBUTEROL (DUONEB) 0.5-2.5 (3) MG/3ML SOLN    Take 3 mLs by nebulization 3 (three) times daily.   PREDNISONE (DELTASONE) 10 MG TABLET    Take 1 tablet (10 mg total) by mouth daily with breakfast.  Previous Medications   ASPIRIN 81 MG TABLET    Take 81 mg by mouth daily.   CLORAZEPATE (TRANXENE) 7.5 MG TABLET    Take 7.5 mg by mouth 2 (two) times daily as needed for anxiety.   LEVOTHYROXINE (SYNTHROID, LEVOTHROID) 112 MCG TABLET    Take 112 mcg by mouth daily before breakfast.   LORATADINE (CLARITIN) 10 MG TABLET    Take 10 mg by mouth daily. As needed   LOSARTAN-HYDROCHLOROTHIAZIDE (HYZAAR) 50-12.5 MG PER TABLET       METOPROLOL SUCCINATE (TOPROL-XL) 50 MG 24 HR TABLET    Take 75 mg by mouth 2 (two) times daily.    PREDNISONE (DELTASONE) 20 MG TABLET    Take 1 2/dayx5day,1x5day,1/2in AM until return in 18month   ROSUVASTATIN (CRESTOR) 10 MG TABLET    Take 10 mg by mouth daily.  Modified Medications   No  medications on file  Discontinued Medications   No medications on file

## 2017-07-04 ENCOUNTER — Other Ambulatory Visit: Payer: Self-pay | Admitting: Pulmonary Disease

## 2017-07-04 ENCOUNTER — Telehealth: Payer: Self-pay | Admitting: Pulmonary Disease

## 2017-07-04 DIAGNOSIS — I272 Pulmonary hypertension, unspecified: Secondary | ICD-10-CM

## 2017-07-04 DIAGNOSIS — R0600 Dyspnea, unspecified: Secondary | ICD-10-CM

## 2017-07-04 LAB — RESPIRATORY ALLERGY PROFILE REGION II ~~LOC~~
Allergen, A. alternata, m6: <0.1 kU/L
Allergen, Cedar tree, t12: <0.1 kU/L
Allergen, Comm Silver Birch, t9: <0.1 kU/L
Allergen, Cottonwood, t14: <0.1 kU/L
Allergen, D pternoyssinus,d7: <0.1 kU/L
Allergen, Mouse Urine Protein, e78: <0.1 kU/L
Bermuda Grass: <0.1 kU/L
Box Elder IgE: <0.1 kU/L
CLADOSPORIUM HERBARUM (M2) IGE: <0.1 kU/L
CLASS: 0
CLASS: 0
CLASS: 0
CLASS: 0
CLASS: 0
CLASS: 0
CLASS: 0
CLASS: 0
CLASS: 0
CLASS: 0
CLASS: 0
CLASS: 0
CLASS: 0
CLASS: 0
CLASS: 0
Cat Dander: <0.1 kU/L
Class: 0
Class: 0
Class: 0
Class: 0
Class: 0
Class: 0
Class: 0
Class: 0
Class: 0
Dog Dander: <0.1 kU/L
Elm IgE: <0.1 kU/L
IgE (Immunoglobulin E), Serum: 17 kU/L (ref <=114)
Johnson Grass: <0.1 kU/L
Sheep Sorrel IgE: <0.1 kU/L

## 2017-07-04 LAB — INTERPRETATION:

## 2017-07-04 NOTE — Telephone Encounter (Signed)
Reordered neb machine under dyspnea and pulmonary hypertension.  Nothing further needed at this time.

## 2017-07-04 NOTE — Telephone Encounter (Signed)
Spoke with Tiffany at [facility], states that [employer] will not cover the ordered nebulizer and neb meds from yesterday under the dx "Dyspnea".  Dx needs to be something chronic.  Otherwise, pt will have to pay out of pocket for nebulizer.  SN please advise if another dx can be used.  Thanks!

## 2017-07-05 ENCOUNTER — Other Ambulatory Visit: Payer: Self-pay | Admitting: Pulmonary Disease

## 2017-07-05 ENCOUNTER — Telehealth: Payer: Self-pay | Admitting: Pulmonary Disease

## 2017-07-05 DIAGNOSIS — R0602 Shortness of breath: Secondary | ICD-10-CM

## 2017-07-05 DIAGNOSIS — R0600 Dyspnea, unspecified: Secondary | ICD-10-CM

## 2017-07-05 NOTE — Telephone Encounter (Signed)
[facility] calling back. States pneumonia in charts will not work and insurance will not pay.  CB is [phone].

## 2017-07-05 NOTE — Telephone Encounter (Signed)
Spoke with Estill Bamberg at [place].   She stated that insurance will not pay for patient's O2 or nebulizer machine with the current diagnosis of pneumonia. Another chronic respiratory diagnosis needs to be used such as chronic respiratory failure, dyspnea, etc. Patient will need to be in a "chronic, stable" state.  She also mentioned that the patient would be to re-qualified after her pneumonia has cleared.   Dr. Lenna Gilford, please advise if there is another diagnosis that we can use for patient's O2 and nebulizer machine. Thanks!

## 2017-07-05 NOTE — Telephone Encounter (Signed)
Spoke with Dr. Lenna Gilford, he stated that is ok to use dx of chronic hypoxemic respiratory failure for the O2 and nebulizer. Per SN, the CXR does not show regular pneumonia and the patient is not clearing up. SN thinks that the patient may have ILD, a HRCT has been ordered to rule this out.   Spoke with Bethanne Ginger, she stated that she will see if the insurance could cover the O2 under the new dx. She will call back.

## 2017-07-06 MED ORDER — IPRATROPIUM-ALBUTEROL 0.5-2.5 (3) MG/3ML IN SOLN: 3.0000 mL | Freq: Three times a day (TID) | RESPIRATORY_TRACT | 2 refills | Status: DC

## 2017-07-06 NOTE — Telephone Encounter (Signed)
Pt husband is at [facility] to pick up Neb. Machine however the [employer]. Solution was not ordered. Per Pt, please send Rx for Neb Solution. Pharm [facility]. Cb is [phone].

## 2017-07-06 NOTE — Telephone Encounter (Signed)
Spoke with pt, advised Rx was sent to [facility]. Pt understood and nothing further is needed.

## 2017-07-07 DIAGNOSIS — J449 Chronic obstructive pulmonary disease, unspecified: Secondary | ICD-10-CM | POA: Diagnosis not present

## 2017-07-10 ENCOUNTER — Telehealth: Payer: Self-pay | Admitting: Pulmonary Disease

## 2017-07-10 NOTE — Telephone Encounter (Signed)
Per SN- He does not believe that this is due to albuterol treatment.  He thinks this is more likely related to muscle spasms. He recommends taking Tranxene 7.5mg  BID regularly scheduled instead of as needed. Called and spoke with Patient about recommendations.  She said that she thought it was due to her potassium being low. She has been eating more potassium rich foods and feels that it has made a difference. She is already taking the Tranxene twice per day. Nothing further needed at this time.

## 2017-07-10 NOTE — Telephone Encounter (Signed)
Called and spoke to pt. Pt is requesting CXR and lab results from 4.22.2019. Pt also states her thumb and index fingers have been contracted to the palm of her hand intermittently since 07/08/2017. Pt states she thinks its from the albuterol neb she received on 4.22.2019 in office. Pt states the spasms are becoming less frequent and less intense since 07/08/17.   SN please advise on results and recs if needed regarding pt's spasms. Thanks.   Allergies  Allergen Reactions  . Adhesive [Tape]   . Codeine   . Lactose Intolerance (Gi) Diarrhea  . Sulfa Antibiotics     Current Outpatient Medications on File Prior to Visit  Medication Sig Dispense Refill  . aspirin 81 MG tablet Take 81 mg by mouth daily.    . budesonide-formoterol (SYMBICORT) 160-4.5 MCG/ACT inhaler Inhale 2 puffs into the lungs 2 (two) times daily. 1 Inhaler 6  . clorazepate (TRANXENE) 7.5 MG tablet Take 7.5 mg by mouth 2 (two) times daily as needed for anxiety.    [name] ipratropium-albuterol (DUONEB) 0.5-2.5 (3) MG/3ML SOLN Take 3 mLs by nebulization 3 (three) times daily. 360 mL 2  . levothyroxine (SYNTHROID, LEVOTHROID) 112 MCG tablet Take 112 mcg by mouth daily before breakfast.    . loratadine (CLARITIN) 10 MG tablet Take 10 mg by mouth daily. As needed    . losartan-hydrochlorothiazide (HYZAAR) 50-12.5 MG per tablet     . metoprolol succinate (TOPROL-XL) 50 MG 24 hr tablet Take 75 mg by mouth 2 (two) times daily.     . predniSONE (DELTASONE) 10 MG tablet Take 1 tablet (10 mg total) by mouth daily with breakfast. 30 tablet 0  . predniSONE (DELTASONE) 20 MG tablet Take 1 2/dayx5day,1x5day,1/2in AM until return in 60month 30 tablet 0  . rosuvastatin (CRESTOR) 10 MG tablet Take 10 mg by mouth daily.     No current facility-administered medications on file prior to visit.

## 2017-07-12 ENCOUNTER — Ambulatory Visit (INDEPENDENT_AMBULATORY_CARE_PROVIDER_SITE_OTHER)
Admission: RE | Admit: 2017-07-12 | Discharge: 2017-07-12 | Disposition: A | Discharge status: Home / Self Care | Payer: PPO | Source: Ambulatory Visit | Attending: Pulmonary Disease | Admitting: Pulmonary Disease

## 2017-07-12 DIAGNOSIS — R0602 Shortness of breath: Secondary | ICD-10-CM

## 2017-07-12 DIAGNOSIS — J181 Lobar pneumonia, unspecified organism: Secondary | ICD-10-CM | POA: Diagnosis not present

## 2017-07-12 DIAGNOSIS — R0600 Dyspnea, unspecified: Secondary | ICD-10-CM

## 2017-07-14 ENCOUNTER — Encounter: Payer: Self-pay | Admitting: Emergency Medicine

## 2017-07-14 ENCOUNTER — Ambulatory Visit: Payer: PPO | Admitting: Emergency Medicine

## 2017-07-14 DIAGNOSIS — R9389 Abnormal findings on diagnostic imaging of other specified body structures: Secondary | ICD-10-CM | POA: Insufficient documentation

## 2017-07-14 NOTE — Assessment & Plan Note (Signed)
Bilateral groundglass infiltrates most prominent and present for the longest in the right upper lobe.  Etiology unclear but no response to corticosteroids, antibiotics.  Her RAST panel, sensitivity to fungal organisms is all negative as is RF, ANA, ESR.  I believe she needs bronchoscopy with BAL and transbronchial biopsies.  If this is unrevealing then we may need to move to a VATS biopsy to get an answer.  I explained this to her in full today.  She is in agreement.  We will hold her aspirin 2 days prior to the procedure and I will try to set it up for next week.

## 2017-07-14 NOTE — Progress Notes (Signed)
Subjective:    Patient ID: Erin Myers, female    DOB: 10/01/1940, 77 y.o.   MRN: [id number]  HPI 77 year old woman, former smoker (23 pack years), followed by Dr. Lenna Gilford in our office.  She has diabetes, hypertension, hyperlipidemia, hypothyroidism.  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  Most recently she is been under evaluation for progressive dyspnea, hypoxemia in the setting of focal groundglass right upper lobe infiltrate with some evolving infiltrates at both bases.  She is been treated empirically with antibiotics and in February started on corticosteroids, but serial imaging confirms that her infiltrates have not resolved and clinically she is unchanged. She underwent HRCT chest 5/1 that I reviewed w Dr Lenna Gilford > shows persistent groundglass attenuation and some associated evolving consolidation with air bronchograms in the right upper lobe, also starting to develop some similar changes in the right middle, right lower and left lower lobes. RF and ANA negative in 04/2017. ESR 13.   She complains of fatigue. She has o2 bled into CPAP, has a POC that is not operating well right now.    Review of Systems  Past Medical History:  Diagnosis Date  . Anxiety   . Diabetes mellitus without complication ([place])   . Dyslipidemia   . HTN (hypertension)   . Hyperlipemia   . Hypothyroid   . Hypothyroidism   . 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 Obesity   . Ovarian cancer ([place])   . Palpitations 2009 and 2006   Negative nuclear stress test   . Sleep apnea    on C-pap     Family History  Problem Relation Age of Onset  . Coronary artery disease Mother 95       died of an MI  . Heart attack Mother   . Suicidality Sister   . CVA Brother 22  . CVA Father      Social History   Socioeconomic History  . Marital status: Married    Spouse name: Not on file  . Number of children: 2  . Years of  education: HS  . Highest education level: Not on file  Occupational History  . Not on file  Social Needs  . Financial resource strain: Not on file  . Food insecurity:    Worry: Not on file    Inability: Not on file  . Transportation needs:    Medical: Not on file    Non-medical: Not on file  Tobacco Use  . Smoking status: Former Smoker    Packs/day: 0.75    Years: 30.00    Pack years: 22.50    Types: Cigarettes    Last attempt to quit: 08/18/1991    Years since quitting: 25.9  . Smokeless tobacco: Never Used  Substance and Sexual Activity  . Alcohol use: No    Alcohol/week: 0.0 oz  . Drug use: No  . Sexual activity: Yes    Partners: Male  Lifestyle  . Physical activity:    Days per week: Not on file    Minutes per session: Not on file  . Stress: Not on file  Relationships  . Social connections:    Talks on phone: Not on file    Gets together: Not on file    Attends religious service: Not on file    Active member of club or organization: Not on file    Attends meetings of clubs  or organizations: Not on file    Relationship status: Not on file  . Intimate partner violence:    Fear of current or ex partner: Not on file    Emotionally abused: Not on file    Physically abused: Not on file    Forced sexual activity: Not on file  Other Topics Concern  . Not on file  Social History Narrative   1-2 glasses of tea a day    Married mother of 2 (daughter close to 72 and son 45-30 years old).  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     Allergies  Allergen Reactions  . Adhesive [Tape]   . Codeine   . Lactose Intolerance (Gi) Diarrhea  . Sulfa Antibiotics      Outpatient Medications Prior to Visit  Medication Sig Dispense Refill  . aspirin 81 MG tablet Take 81 mg by mouth daily.    . budesonide-formoterol (SYMBICORT) 160-4.5 MCG/ACT inhaler Inhale 2 puffs into the lungs 2 (two) times daily. 1 Inhaler 6    . clorazepate (TRANXENE) 7.5 MG tablet Take 7.5 mg by mouth 2 (two) times daily as needed for anxiety.    [name] ipratropium-albuterol (DUONEB) 0.5-2.5 (3) MG/3ML SOLN Take 3 mLs by nebulization 3 (three) times daily. 360 mL 2  . levothyroxine (SYNTHROID, LEVOTHROID) 112 MCG tablet Take 112 mcg by mouth daily before breakfast.    . loratadine (CLARITIN) 10 MG tablet Take 10 mg by mouth daily. As needed    . losartan-hydrochlorothiazide (HYZAAR) 50-12.5 MG per tablet     . metoprolol succinate (TOPROL-XL) 50 MG 24 hr tablet Take 75 mg by mouth 2 (two) times daily.     . predniSONE (DELTASONE) 10 MG tablet Take 1 tablet (10 mg total) by mouth daily with breakfast. 30 tablet 0  . predniSONE (DELTASONE) 20 MG tablet Take 1 2/dayx5day,1x5day,1/2in AM until return in 61month30 tablet 0  . rosuvastatin (CRESTOR) 10 MG tablet Take 10 mg by mouth daily.     No facility-administered medications prior to visit.         Objective:   Physical Exam Vitals:   07/14/17 1015  BP: 132/84  Pulse: 80  SpO2: 90%  Weight: 210 lb (95.3 kg)  Height: _0  (1.651 m)   Gen: Pleasant, overwt woman, in no distress,  normal affect  ENT: No lesions,  mouth clear,  oropharynx clear, no postnasal drip  Neck: No JVD, no stridor  Lungs: No use of accessory muscles, bilateral inspiratory crackles with expiratory rhonchi, some bronchial breath sounds at the right apex, no wheezing  Cardiovascular: RRR, heart sounds normal, no murmur or gallops, no peripheral edema  Musculoskeletal: No deformities, no cyanosis or clubbing  Neuro: alert, non focal  Skin: Warm, no lesions or rash     Assessment & Plan:  Abnormal CT of the chest Bilateral groundglass infiltrates most prominent and present for the longest in the right upper lobe.  Etiology unclear but no response to corticosteroids, antibiotics.  Her RAST panel, sensitivity to fungal organisms is all negative as is RF, ANA, ESR.  I believe she needs bronchoscopy  with BAL and transbronchial biopsies.  If this is unrevealing then we may need to move to a VATS biopsy to get an answer.  I explained this to her in full today.  She is in agreement.  We will hold her aspirin 2 days prior to the procedure and I will  try to set it up for next week.     Baltazar Apo, MD, PhD 07/14/2017, 10:52 AM [facility] [phone] or if no answer [phone]

## 2017-07-14 NOTE — Patient Instructions (Addendum)
We will arrange for bronchoscopy, planning for next week if possible.  Please stop your aspirin 2 days prior.  Continue your prednisone for now. We will discuss with Dr Lenna Gilford possibly weaning it down / off.  Continue your oxygen with exertion Continue Symbicort as you are taking it Keep albuterol / ipratropium nebs three times a day.  Follow with Dr Lamonte Sakai next available after your testing to review the results.

## 2017-07-17 ENCOUNTER — Telehealth: Payer: Self-pay | Admitting: Emergency Medicine

## 2017-07-17 NOTE — Telephone Encounter (Signed)
Called and spoke with patient, she is asking if it was ok to take synthroid before her bronch. I advised patient that it was NPO after midnight the night before. Patient verbalized understanding.

## 2017-07-18 ENCOUNTER — Emergency Department (HOSPITAL_COMMUNITY): Payer: PPO

## 2017-07-18 ENCOUNTER — Ambulatory Visit (HOSPITAL_COMMUNITY): Admission: RE | Admit: 2017-07-18 | Payer: PPO | Source: Ambulatory Visit | Admitting: Emergency Medicine

## 2017-07-18 ENCOUNTER — Inpatient Hospital Stay (HOSPITAL_COMMUNITY)
Admission: EM | Admit: 2017-07-18 | Discharge: 2017-07-21 | DRG: 853 | Disposition: A | Discharge status: Home / Self Care | Payer: PPO | Attending: Internal Medicine | Admitting: Internal Medicine

## 2017-07-18 ENCOUNTER — Inpatient Hospital Stay (HOSPITAL_COMMUNITY)
Admission: RE | Admit: 2017-07-18 | Discharge: 2017-07-18 | Disposition: A | Discharge status: Home / Self Care | Payer: PPO | Source: Ambulatory Visit

## 2017-07-18 ENCOUNTER — Encounter (HOSPITAL_COMMUNITY): Admission: RE | Payer: Self-pay | Source: Ambulatory Visit

## 2017-07-18 ENCOUNTER — Other Ambulatory Visit: Payer: Self-pay

## 2017-07-18 ENCOUNTER — Encounter (HOSPITAL_COMMUNITY): Payer: Self-pay

## 2017-07-18 ENCOUNTER — Telehealth: Payer: Self-pay | Admitting: Emergency Medicine

## 2017-07-18 DIAGNOSIS — E039 Hypothyroidism, unspecified: Secondary | ICD-10-CM | POA: Diagnosis present

## 2017-07-18 DIAGNOSIS — A419 Sepsis, unspecified organism: Principal | ICD-10-CM | POA: Diagnosis present

## 2017-07-18 DIAGNOSIS — R918 Other nonspecific abnormal finding of lung field: Secondary | ICD-10-CM | POA: Diagnosis not present

## 2017-07-18 DIAGNOSIS — Z882 Allergy status to sulfonamides status: Secondary | ICD-10-CM | POA: Diagnosis not present

## 2017-07-18 DIAGNOSIS — R1110 Vomiting, unspecified: Secondary | ICD-10-CM | POA: Diagnosis not present

## 2017-07-18 DIAGNOSIS — M5430 Sciatica, unspecified side: Secondary | ICD-10-CM | POA: Diagnosis not present

## 2017-07-18 DIAGNOSIS — E876 Hypokalemia: Secondary | ICD-10-CM | POA: Diagnosis not present

## 2017-07-18 DIAGNOSIS — Z9889 Other specified postprocedural states: Secondary | ICD-10-CM

## 2017-07-18 DIAGNOSIS — Z902 Acquired absence of lung [part of]: Secondary | ICD-10-CM

## 2017-07-18 DIAGNOSIS — E785 Hyperlipidemia, unspecified: Secondary | ICD-10-CM | POA: Diagnosis not present

## 2017-07-18 DIAGNOSIS — Z7982 Long term (current) use of aspirin: Secondary | ICD-10-CM

## 2017-07-18 DIAGNOSIS — E669 Obesity, unspecified: Secondary | ICD-10-CM | POA: Diagnosis present

## 2017-07-18 DIAGNOSIS — Z888 Allergy status to other drugs, medicaments and biological substances status: Secondary | ICD-10-CM

## 2017-07-18 DIAGNOSIS — I10 Essential (primary) hypertension: Secondary | ICD-10-CM

## 2017-07-18 DIAGNOSIS — R9389 Abnormal findings on diagnostic imaging of other specified body structures: Secondary | ICD-10-CM | POA: Diagnosis not present

## 2017-07-18 DIAGNOSIS — E739 Lactose intolerance, unspecified: Secondary | ICD-10-CM | POA: Diagnosis not present

## 2017-07-18 DIAGNOSIS — Z90710 Acquired absence of both cervix and uterus: Secondary | ICD-10-CM

## 2017-07-18 DIAGNOSIS — J189 Pneumonia, unspecified organism: Secondary | ICD-10-CM | POA: Diagnosis present

## 2017-07-18 DIAGNOSIS — Z6835 Body mass index (BMI) 35.0-35.9, adult: Secondary | ICD-10-CM

## 2017-07-18 DIAGNOSIS — J0100 Acute maxillary sinusitis, unspecified: Secondary | ICD-10-CM

## 2017-07-18 DIAGNOSIS — T380X5A Adverse effect of glucocorticoids and synthetic analogues, initial encounter: Secondary | ICD-10-CM | POA: Diagnosis not present

## 2017-07-18 DIAGNOSIS — R112 Nausea with vomiting, unspecified: Secondary | ICD-10-CM | POA: Diagnosis not present

## 2017-07-18 DIAGNOSIS — Z885 Allergy status to narcotic agent status: Secondary | ICD-10-CM | POA: Diagnosis not present

## 2017-07-18 DIAGNOSIS — Z87891 Personal history of nicotine dependence: Secondary | ICD-10-CM | POA: Diagnosis not present

## 2017-07-18 DIAGNOSIS — Z823 Family history of stroke: Secondary | ICD-10-CM | POA: Diagnosis not present

## 2017-07-18 DIAGNOSIS — J9809 Other diseases of bronchus, not elsewhere classified: Secondary | ICD-10-CM | POA: Diagnosis present

## 2017-07-18 DIAGNOSIS — Z8543 Personal history of malignant neoplasm of ovary: Secondary | ICD-10-CM

## 2017-07-18 DIAGNOSIS — N390 Urinary tract infection, site not specified: Secondary | ICD-10-CM | POA: Diagnosis not present

## 2017-07-18 DIAGNOSIS — J9621 Acute and chronic respiratory failure with hypoxia: Secondary | ICD-10-CM | POA: Diagnosis present

## 2017-07-18 DIAGNOSIS — R0902 Hypoxemia: Secondary | ICD-10-CM | POA: Diagnosis not present

## 2017-07-18 DIAGNOSIS — Z9989 Dependence on other enabling machines and devices: Secondary | ICD-10-CM | POA: Diagnosis not present

## 2017-07-18 DIAGNOSIS — F419 Anxiety disorder, unspecified: Secondary | ICD-10-CM | POA: Diagnosis not present

## 2017-07-18 DIAGNOSIS — Z8249 Family history of ischemic heart disease and other diseases of the circulatory system: Secondary | ICD-10-CM

## 2017-07-18 DIAGNOSIS — R848 Other abnormal findings in specimens from respiratory organs and thorax: Secondary | ICD-10-CM | POA: Diagnosis not present

## 2017-07-18 DIAGNOSIS — J329 Chronic sinusitis, unspecified: Secondary | ICD-10-CM | POA: Diagnosis not present

## 2017-07-18 DIAGNOSIS — Z9981 Dependence on supplemental oxygen: Secondary | ICD-10-CM

## 2017-07-18 DIAGNOSIS — I119 Hypertensive heart disease without heart failure: Secondary | ICD-10-CM | POA: Diagnosis not present

## 2017-07-18 DIAGNOSIS — Y92230 Patient room in hospital as the place of occurrence of the external cause: Secondary | ICD-10-CM | POA: Diagnosis not present

## 2017-07-18 DIAGNOSIS — Z7984 Long term (current) use of oral hypoglycemic drugs: Secondary | ICD-10-CM

## 2017-07-18 DIAGNOSIS — J40 Bronchitis, not specified as acute or chronic: Secondary | ICD-10-CM | POA: Diagnosis not present

## 2017-07-18 DIAGNOSIS — G4733 Obstructive sleep apnea (adult) (pediatric): Secondary | ICD-10-CM | POA: Diagnosis not present

## 2017-07-18 DIAGNOSIS — Z7952 Long term (current) use of systemic steroids: Secondary | ICD-10-CM

## 2017-07-18 DIAGNOSIS — C3411 Malignant neoplasm of upper lobe, right bronchus or lung: Secondary | ICD-10-CM | POA: Diagnosis not present

## 2017-07-18 DIAGNOSIS — Z85118 Personal history of other malignant neoplasm of bronchus and lung: Secondary | ICD-10-CM

## 2017-07-18 DIAGNOSIS — Z7951 Long term (current) use of inhaled steroids: Secondary | ICD-10-CM

## 2017-07-18 DIAGNOSIS — E1165 Type 2 diabetes mellitus with hyperglycemia: Secondary | ICD-10-CM | POA: Diagnosis not present

## 2017-07-18 DIAGNOSIS — R05 Cough: Secondary | ICD-10-CM | POA: Diagnosis not present

## 2017-07-18 DIAGNOSIS — Z79899 Other long term (current) drug therapy: Secondary | ICD-10-CM

## 2017-07-18 LAB — CBC WITH DIFFERENTIAL/PLATELET
BASOS PCT: 0 %
Basophils Absolute: 0 10*3/uL (ref 0.0–0.1)
EOS ABS: 0.1 10*3/uL (ref 0.0–0.7)
Eosinophils Relative: 0 %
HCT: 50.6 % — ABNORMAL HIGH (ref 36.0–46.0)
HEMOGLOBIN: 16.4 g/dL — AB (ref 12.0–15.0)
Lymphocytes Relative: 10 %
Lymphs Abs: 1.8 10*3/uL (ref 0.7–4.0)
MCH: 31 pg (ref 26.0–34.0)
MCHC: 32.4 g/dL (ref 30.0–36.0)
MCV: 95.7 fL (ref 78.0–100.0)
Monocytes Absolute: 1.5 10*3/uL — ABNORMAL HIGH (ref 0.1–1.0)
Monocytes Relative: 8 %
NEUTROS PCT: 82 %
Neutro Abs: 15.4 10*3/uL — ABNORMAL HIGH (ref 1.7–7.7)
Platelets: 238 10*3/uL (ref 150–400)
RBC: 5.29 MIL/uL — AB (ref 3.87–5.11)
RDW: 15.9 % — ABNORMAL HIGH (ref 11.5–15.5)
WBC: 18.9 10*3/uL — AB (ref 4.0–10.5)

## 2017-07-18 LAB — COMPREHENSIVE METABOLIC PANEL
ALBUMIN: 3.3 g/dL — AB (ref 3.5–5.0)
ALT: 12 U/L — ABNORMAL LOW (ref 14–54)
ANION GAP: 13 (ref 5–15)
AST: 15 U/L (ref 15–41)
Alkaline Phosphatase: 48 U/L (ref 38–126)
BUN: 16 mg/dL (ref 6–20)
CALCIUM: 8.9 mg/dL (ref 8.9–10.3)
CO2: 26 mmol/L (ref 22–32)
Chloride: 99 mmol/L — ABNORMAL LOW (ref 101–111)
Creatinine, Ser: 0.84 mg/dL (ref 0.44–1.00)
GFR calc Af Amer: >60 mL/min (ref >60)
GFR calc non Af Amer: >60 mL/min (ref >60)
GLUCOSE: 98 mg/dL (ref 65–99)
POTASSIUM: 3.3 mmol/L — AB (ref 3.5–5.1)
SODIUM: 138 mmol/L (ref 135–145)
Total Bilirubin: 1.7 mg/dL — ABNORMAL HIGH (ref 0.3–1.2)
Total Protein: 6.5 g/dL (ref 6.5–8.1)

## 2017-07-18 LAB — LIPASE, BLOOD: Lipase: 22 U/L (ref 11–51)

## 2017-07-18 LAB — URINALYSIS, ROUTINE W REFLEX MICROSCOPIC
BACTERIA UA: NONE SEEN
Bilirubin Urine: NEGATIVE
Glucose, UA: NEGATIVE mg/dL
Hgb urine dipstick: NEGATIVE
Ketones, ur: NEGATIVE mg/dL
Nitrite: NEGATIVE
PROTEIN: NEGATIVE mg/dL
Specific Gravity, Urine: 1.005 (ref 1.005–1.030)
pH: 7 (ref 5.0–8.0)

## 2017-07-18 LAB — I-STAT CG4 LACTIC ACID, ED: Lactic Acid, Venous: 1.18 mmol/L (ref 0.5–1.9)

## 2017-07-18 LAB — TSH: TSH: 0.326 u[IU]/mL — AB (ref 0.350–4.500)

## 2017-07-18 LAB — D-DIMER, QUANTITATIVE (NOT AT ARMC)

## 2017-07-18 SURGERY — BRONCHOSCOPY, WITH FLUOROSCOPY
Anesthesia: Moderate Sedation | Laterality: Bilateral

## 2017-07-18 MED ORDER — ROSUVASTATIN CALCIUM 10 MG PO TABS
10.0000 mg | ORAL_TABLET | Freq: Every evening | ORAL | Status: DC
  Administered 2017-07-19 – 2017-07-20 (×3): 10 mg via ORAL
  Filled 2017-07-18 (×3): qty 1

## 2017-07-18 MED ORDER — SODIUM CHLORIDE 0.9% FLUSH
3.0000 mL | Freq: Two times a day (BID) | INTRAVENOUS | Status: DC
  Administered 2017-07-20 (×2): 3 mL via INTRAVENOUS

## 2017-07-18 MED ORDER — ARFORMOTEROL TARTRATE 15 MCG/2ML IN NEBU
15.0000 ug | INHALATION_SOLUTION | Freq: Two times a day (BID) | RESPIRATORY_TRACT | Status: DC
  Administered 2017-07-18 – 2017-07-21 (×5): 15 ug via RESPIRATORY_TRACT
  Filled 2017-07-18 (×8): qty 2

## 2017-07-18 MED ORDER — IPRATROPIUM-ALBUTEROL 0.5-2.5 (3) MG/3ML IN SOLN: 3.0000 mL | RESPIRATORY_TRACT | Status: DC | PRN

## 2017-07-18 MED ORDER — LEVOTHYROXINE SODIUM 112 MCG PO TABS
112.0000 ug | ORAL_TABLET | Freq: Every day | ORAL | Status: DC
  Administered 2017-07-19 – 2017-07-21 (×3): 112 ug via ORAL
  Filled 2017-07-18 (×3): qty 1

## 2017-07-18 MED ORDER — IPRATROPIUM-ALBUTEROL 0.5-2.5 (3) MG/3ML IN SOLN
3.0000 mL | Freq: Four times a day (QID) | RESPIRATORY_TRACT | Status: DC
  Administered 2017-07-18 – 2017-07-21 (×9): 3 mL via RESPIRATORY_TRACT
  Filled 2017-07-18 (×11): qty 3

## 2017-07-18 MED ORDER — HYDROCHLOROTHIAZIDE 12.5 MG PO CAPS
12.5000 mg | ORAL_CAPSULE | Freq: Every day | ORAL | Status: DC
  Administered 2017-07-19 – 2017-07-21 (×3): 12.5 mg via ORAL
  Filled 2017-07-18 (×3): qty 1

## 2017-07-18 MED ORDER — VANCOMYCIN HCL 10 G IV SOLR
1500.0000 mg | Freq: Once | INTRAVENOUS | Status: AC
  Administered 2017-07-19: 1500 mg via INTRAVENOUS
  Filled 2017-07-18: qty 1500

## 2017-07-18 MED ORDER — LOSARTAN POTASSIUM-HCTZ 50-12.5 MG PO TABS: 1.0000 | ORAL_TABLET | Freq: Every morning | ORAL | Status: DC

## 2017-07-18 MED ORDER — ASPIRIN 81 MG PO CHEW: 81.0000 mg | CHEWABLE_TABLET | Freq: Every day | ORAL | Status: DC

## 2017-07-18 MED ORDER — ACETAMINOPHEN 325 MG PO TABS
650.0000 mg | ORAL_TABLET | Freq: Four times a day (QID) | ORAL | Status: DC | PRN
  Filled 2017-07-18: qty 2

## 2017-07-18 MED ORDER — CLORAZEPATE DIPOTASSIUM 7.5 MG PO TABS
7.5000 mg | ORAL_TABLET | Freq: Two times a day (BID) | ORAL | Status: DC | PRN
  Administered 2017-07-19 – 2017-07-20 (×2): 7.5 mg via ORAL
  Filled 2017-07-18 (×2): qty 1

## 2017-07-18 MED ORDER — ALBUTEROL SULFATE (2.5 MG/3ML) 0.083% IN NEBU
5.0000 mg | INHALATION_SOLUTION | Freq: Once | RESPIRATORY_TRACT | Status: AC
  Administered 2017-07-18: 5 mg via RESPIRATORY_TRACT
  Filled 2017-07-18: qty 6

## 2017-07-18 MED ORDER — BUDESONIDE 0.5 MG/2ML IN SUSP
0.5000 mg | Freq: Two times a day (BID) | RESPIRATORY_TRACT | Status: DC
  Administered 2017-07-18 – 2017-07-21 (×5): 0.5 mg via RESPIRATORY_TRACT
  Filled 2017-07-18 (×7): qty 2

## 2017-07-18 MED ORDER — SODIUM CHLORIDE 0.9 % IV SOLN
500.0000 mg | Freq: Once | INTRAVENOUS | Status: AC
  Administered 2017-07-18: 500 mg via INTRAVENOUS
  Filled 2017-07-18: qty 500

## 2017-07-18 MED ORDER — SODIUM CHLORIDE 0.9 % IV SOLN
INTRAVENOUS | Status: DC
  Administered 2017-07-19: 03:00:00 via INTRAVENOUS

## 2017-07-18 MED ORDER — METOPROLOL SUCCINATE ER 50 MG PO TB24
50.0000 mg | ORAL_TABLET | Freq: Two times a day (BID) | ORAL | Status: DC
  Administered 2017-07-19 – 2017-07-21 (×6): 50 mg via ORAL
  Filled 2017-07-18 (×7): qty 1

## 2017-07-18 MED ORDER — ENOXAPARIN SODIUM 40 MG/0.4ML ~~LOC~~ SOLN
40.0000 mg | SUBCUTANEOUS | Status: DC
  Administered 2017-07-19: 40 mg via SUBCUTANEOUS
  Filled 2017-07-18: qty 0.4

## 2017-07-18 MED ORDER — POTASSIUM CHLORIDE CRYS ER 10 MEQ PO TBCR
30.0000 meq | EXTENDED_RELEASE_TABLET | ORAL | Status: AC
  Administered 2017-07-19: 30 meq via ORAL
  Filled 2017-07-18: qty 3

## 2017-07-18 MED ORDER — METHYLPREDNISOLONE SODIUM SUCC 125 MG IJ SOLR
60.0000 mg | Freq: Three times a day (TID) | INTRAMUSCULAR | Status: DC
  Administered 2017-07-19: 60 mg via INTRAVENOUS
  Filled 2017-07-18: qty 2

## 2017-07-18 MED ORDER — CEFEPIME HCL 1 G IJ SOLR
1.0000 g | Freq: Three times a day (TID) | INTRAMUSCULAR | Status: DC
  Administered 2017-07-19 (×2): 1 g via INTRAVENOUS
  Filled 2017-07-18 (×3): qty 1

## 2017-07-18 MED ORDER — SODIUM CHLORIDE 0.9 % IV SOLN: 500.0000 mg | INTRAVENOUS | Status: DC

## 2017-07-18 MED ORDER — ONDANSETRON HCL 4 MG/2ML IJ SOLN: 4.0000 mg | Freq: Four times a day (QID) | INTRAMUSCULAR | Status: DC | PRN

## 2017-07-18 MED ORDER — SODIUM CHLORIDE 0.9 % IV SOLN: 2.0000 g | INTRAVENOUS | Status: DC

## 2017-07-18 MED ORDER — ACETAMINOPHEN 650 MG RE SUPP
650.0000 mg | Freq: Four times a day (QID) | RECTAL | Status: DC | PRN
Start: 2017-07-18 — End: 2017-07-21

## 2017-07-18 MED ORDER — SODIUM CHLORIDE 0.9 % IV SOLN
1.0000 g | INTRAVENOUS | Status: DC
  Administered 2017-07-18: 1 g via INTRAVENOUS
  Filled 2017-07-18: qty 10

## 2017-07-18 MED ORDER — ONDANSETRON HCL 4 MG PO TABS: 4.0000 mg | ORAL_TABLET | Freq: Four times a day (QID) | ORAL | Status: DC | PRN

## 2017-07-18 MED ORDER — IOPAMIDOL (ISOVUE-300) INJECTION 61%
100.0000 mL | Freq: Once | INTRAVENOUS | Status: AC | PRN
  Administered 2017-07-18: 100 mL via INTRAVENOUS

## 2017-07-18 MED ORDER — GUAIFENESIN ER 600 MG PO TB12
600.0000 mg | ORAL_TABLET | Freq: Two times a day (BID) | ORAL | Status: DC
  Administered 2017-07-19 – 2017-07-21 (×6): 600 mg via ORAL
  Filled 2017-07-18 (×6): qty 1

## 2017-07-18 MED ORDER — METHYLPREDNISOLONE SODIUM SUCC 125 MG IJ SOLR
125.0000 mg | INTRAMUSCULAR | Status: AC
  Administered 2017-07-19: 125 mg via INTRAVENOUS
  Filled 2017-07-18: qty 2

## 2017-07-18 MED ORDER — IOPAMIDOL (ISOVUE-300) INJECTION 61%
INTRAVENOUS | Status: AC
  Filled 2017-07-18: qty 100

## 2017-07-18 MED ORDER — LOSARTAN POTASSIUM 50 MG PO TABS
50.0000 mg | ORAL_TABLET | Freq: Every day | ORAL | Status: DC
  Administered 2017-07-19 – 2017-07-21 (×3): 50 mg via ORAL
  Filled 2017-07-18 (×3): qty 1

## 2017-07-18 NOTE — ED Provider Notes (Signed)
Erin Myers   Myers: [id number] Myers date & time: 07/18/17  1601     Myers   Myers Complaint No Myers complaint on file.   Myers Erin Myers is a 77 y.o. female.  Myers  77 year old female presents with vomiting and fever.  All of her symptoms started this morning when she woke up.  She woke up and had some chills and this morning had a maximum temperature of 103.  She has taken Advil.  About 30 minutes later she started developing vomiting as well as abdominal pain.  She states her abdomen feels sore.  She has a chronic cough and was due for a video bronchoscopy for persistent groundglass opacities.  She states the cough is not different than normal and she does not feel dyspneic.  There is no headache, neck pain, chest pain.  She has some chronic sciatic back pain but no new back pain.  She is supposed to be on oxygen but has been having trouble using the portable oxygen at home and so only uses it with CPAP.  She denies any urinary symptoms or Myers rash/redness.  Myers Myers Myers:  Diagnosis Date  . Anxiety   . Diabetes mellitus without complication ([place])   . Dyslipidemia   . HTN (hypertension)   . Hyperlipemia   . Hypothyroid   . Hypothyroidism   . Lung cancer ([place]) 2000   Carcinoid, s/p RLL lobectomy  . Obesity   . Ovarian cancer ([place])   . Palpitations 2009 and 2006   Negative nuclear stress test   . Sleep apnea    on C-pap    Patient Active Problem List   Diagnosis Date Noted  . Sepsis ([place]) 07/18/2017  . Abnormal CT of the chest 07/14/2017  . Hypoxemia 07/03/2017  . Community acquired pneumonia of right upper lobe of lung ([place]) 05/30/2017  . Dyspnea 05/02/2017  . 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 Myers of posttraumatic stress disorder (PTSD) 05/14/2015  . Obesity (BMI 30-39.9) 08/20/2012  . Myers Myers of coronary artery disease 08/17/2012  . Palpitations   . OSA on CPAP   .  Essential hypertension   . Dyslipidemia, goal LDL below 130   . Hypothyroid     Myers Myers Myers:  Procedure Laterality Date  . ABDOMINAL HYSTERECTOMY  2001  . GRADED EXERCISE TOLERANCE TEST  08/2015    Myers pressure demonstrated a hypertensive response to exercise.  There was less than 0.51mm of horizontal ST segment depression in the inferolateral leads. No diagnostic criterial for ischemia. Moderately impaired exercise tolerance. The patient achieved 5.8 mets. This is a low risk study.  . LOBECTOMY  2000   RLL  . NM MYOCAR PERF WALL MOTION  08/20/2007   EF 74%  EXERCISE CAPACITY 7 METS  . OOPHORECTOMY  2001  . TONSILLECTOMY       OB Myers   None      Home Medications    Prior to Admission medications   Medication Sig Start Date End Date Taking? Authorizing Provider  aspirin 81 MG tablet Take 81 mg by mouth daily.   Yes [provider]  budesonide-formoterol (SYMBICORT) 160-4.5 MCG/ACT inhaler Inhale 2 puffs into the lungs 2 (two) times daily. 07/03/17  Yes Noralee Space, MD  clorazepate (TRANXENE) 7.5 MG tablet Take 7.5 mg by mouth 2 (two) times daily as needed for anxiety.   Yes [provider]  ipratropium-albuterol (DUONEB) 0.5-2.5 (3) MG/3ML SOLN  Take 3 mLs by nebulization 3 (three) times daily. 07/06/17  Yes Noralee Space, MD  levothyroxine (SYNTHROID, LEVOTHROID) 112 MCG tablet Take 112 mcg by mouth daily before breakfast.   Yes [provider]  losartan-hydrochlorothiazide (HYZAAR) 50-12.5 MG per tablet Take 1 tablet by mouth every morning.  11/11/13  Yes [provider]  metoprolol succinate (TOPROL-XL) 50 MG 24 hr tablet Take 50 mg by mouth 2 (two) times daily.    Yes [provider]  predniSONE (DELTASONE) 10 MG tablet Take 1 tablet (10 mg total) by mouth daily with breakfast. 07/03/17  Yes Noralee Space, MD  rosuvastatin (CRESTOR) 10 MG tablet Take 10 mg by mouth every evening.    Yes [provider]     Myers Myers Myers Myers  Problem Relation Age of Onset  . Coronary artery disease Mother 20       died of an MI  . Heart attack Mother   . Suicidality Sister   . CVA Brother 36  . CVA Father     Myers Myers Myers Myers   Tobacco Use  . Smoking status: Former Smoker    Packs/day: 0.75    Years: 30.00    Pack years: 22.50    Types: Cigarettes    Last attempt to quit: 08/18/1991    Years since quitting: 25.9  . Smokeless tobacco: Never Used  Substance Use Topics  . Alcohol use: No    Alcohol/week: 0.0 oz  . Drug use: No     Allergies   Adhesive [tape]; Codeine; Lactose intolerance (gi); Monosodium glutamate; and Sulfa antibiotics   Myers of Systems Myers of Systems  Constitutional: Positive for fever.  Respiratory: Positive for cough. Negative for shortness of breath.   Cardiovascular: Negative for chest pain.  Gastrointestinal: Positive for abdominal pain, constipation and vomiting.  Genitourinary: Negative for dysuria.  Musculoskeletal: Positive for back pain.  Neurological: Negative for headaches.  All other systems reviewed and are negative.    Physical Exam Updated Vital Signs BP (!) 149/61 (BP Location: Right Arm)   Pulse 100   Temp (!) 102.7 F (39.3 C) (Oral)   Resp 19   Ht 5\' 4"  (1.626 m)   Wt 92.6 kg (204 lb 2.3 oz)   SpO2 92%   BMI 35.04 kg/m   Physical Exam  Constitutional: She is oriented to person, place, and time. She appears well-developed and well-nourished. No distress.  HENT:  Head: Normocephalic and atraumatic.  Right Ear: External ear normal.  Left Ear: External ear normal.  Nose: Nose normal.  Eyes: Right eye exhibits no discharge. Left eye exhibits no discharge.  Cardiovascular: Normal rate, regular rhythm and normal heart sounds.  Pulmonary/Chest: Effort normal. She has wheezes. She has rales.  Abdominal: Soft. There is tenderness in the right lower quadrant and suprapubic area. There is no CVA tenderness.   Neurological: She is alert and oriented to person, place, and time.  Skin: Skin is warm and dry. She is not diaphoretic.  Nursing Myers and vitals reviewed.    ED Treatments / Results  Myers (all Myers ordered are listed, but only abnormal results are displayed) Myers Reviewed  COMPREHENSIVE METABOLIC PANEL - Abnormal; Notable for the following components:      Result Value   Potassium 3.3 (*)    Chloride 99 (*)    Albumin 3.3 (*)    ALT 12 (*)    Total Bilirubin 1.7 (*)    All other components within normal limits  URINALYSIS, ROUTINE W REFLEX MICROSCOPIC - Abnormal; Notable for the following components:   Leukocytes, UA LARGE (*)    All other components within normal limits  CBC WITH DIFFERENTIAL/PLATELET - Abnormal; Notable for the following components:   WBC 18.9 (*)    RBC 5.29 (*)    Hemoglobin 16.4 (*)    HCT 50.6 (*)    RDW 15.9 (*)    Neutro Abs 15.4 (*)    Monocytes Absolute 1.5 (*)    All other components within normal limits  TSH - Abnormal; Notable for the following components:   TSH 0.326 (*)    All other components within normal limits  CULTURE, Myers (ROUTINE X 2)  CULTURE, Myers (ROUTINE X 2)  URINE CULTURE  CULTURE, EXPECTORATED SPUTUM-ASSESSMENT  GRAM STAIN  RESPIRATORY PANEL BY PCR  LIPASE, Myers  D-DIMER, QUANTITATIVE (NOT AT [facility])  STREP PNEUMONIAE URINARY ANTIGEN  LEGIONELLA PNEUMOPHILA SEROGP 1 UR AG  PROCALCITONIN  CBC WITH DIFFERENTIAL/PLATELET  BASIC METABOLIC PANEL  I-STAT CG4 LACTIC ACID, ED    EKG EKG Interpretation  Date/Time:  Tuesday Jul 18 2017 16:42:57 EDT Ventricular Rate:  89 PR Interval:    QRS Duration: 87 QT Interval:  353 QTC Calculation: 430 R Axis:   21 Text Interpretation:  Normal sinus rhythm Low voltage, extremity leads Confirmed by Sherwood Gambler [id number]) on 07/18/2017 5:19:15 PM   Radiology Dg Chest 2 View  Result Date: 07/18/2017 CLINICAL DATA:  77 year old female with nausea vomiting. Fever. Prior right lung  surgery for carcinoid. Cough since October 2018. EXAM: CHEST - 2 VIEW COMPARISON:  High-resolution chest CT 07/12/2017 and earlier. FINDINGS: Progressed upper and lower lung ground-glass opacity since 07/03/2017 radiographs, maximal in the right upper lobe. No consolidation. Findings are likely stable from the recent CT. No superimposed pneumothorax or pleural effusion. Visualized tracheal air column is within normal limits. Mediastinal contours remain normal. No acute osseous abnormality identified. Paucity of bowel gas in the upper abdomen. IMPRESSION: 1. Abnormal bilateral pulmonary ground-glass opacity, maximal in the right upper lobe. No associated pleural effusion. 2. These lung findings are probably not significantly changed from the recent High-resolution Chest CT on 07/12/2017. The presence of fever would favor atypical infection, but please see that CT report regarding all differential diagnostic considerations. Electronically Signed   By: Genevie Ann M.D.   On: 07/18/2017 17:31   Ct Abdomen Pelvis W Contrast  Result Date: 07/18/2017 CLINICAL DATA:  Nausea and vomiting.  Cough and congestion. EXAM: CT ABDOMEN AND PELVIS WITH CONTRAST TECHNIQUE: Multidetector CT imaging of the abdomen and pelvis was performed using the standard protocol following bolus administration of intravenous contrast. CONTRAST:  130mL ISOVUE-300 IOPAMIDOL (ISOVUE-300) INJECTION 61% COMPARISON:  CT chest 07/12/2017. FINDINGS: Lower chest: Ground-glass attenuation throughout the lower lobes, LEFT greater than RIGHT, with consolidation, mildly increased from prior CT chest. Emphysematous changes. Hepatobiliary: No focal liver abnormality is seen. No gallstones, gallbladder wall thickening, or biliary dilatation. Pancreas: Unremarkable. No pancreatic ductal dilatation or surrounding inflammatory changes. Spleen: Normal in size without focal abnormality. Adrenals/Urinary Tract: Adrenal glands are unremarkable. Kidneys are normal, without  renal calculi, focal lesion, or hydronephrosis. Bladder is unremarkable. Stomach/Bowel: Stomach is within normal limits. No visible appendiceal inflammation, may be surgically absent. No evidence of bowel wall thickening, distention, or inflammatory changes. Vascular/Lymphatic: Aortic atherosclerosis. No enlarged abdominal or pelvic lymph nodes. Reproductive: Status post hysterectomy. No adnexal masses. Other: Small fat containing umbilical hernia. Musculoskeletal: No acute or significant osseous findings. IMPRESSION: No acute or worrisome  findings in the abdomen and pelvis. No bowel obstruction, free fluid, or free air. Appendix not visualized, may be surgically absent. BILATERAL lower lobe opacities, greater on the LEFT, are mildly increased from CT chest several days ago. Correlate clinically for pneumonia. Aortic Atherosclerosis (ICD10-I70.0) and Emphysema (ICD10-J43.9). Electronically Signed   By: Staci Righter M.D.   On: 07/18/2017 20:20    Procedures Procedures (including critical care time)  Medications Ordered in ED Medications  iopamidol (ISOVUE-300) 61 % injection (has no administration in time range)  cefTRIAXone (ROCEPHIN) 1 g in sodium chloride 0.9 % 100 mL IVPB (1 g Intravenous New Bag/Given 07/18/17 2300)  metoprolol succinate (TOPROL-XL) 24 hr tablet 50 mg (has no administration in time range)  rosuvastatin (CRESTOR) tablet 10 mg (has no administration in time range)  levothyroxine (SYNTHROID, LEVOTHROID) tablet 112 mcg (has no administration in time range)  clorazepate (TRANXENE) tablet 7.5 mg (has no administration in time range)  ipratropium-albuterol (DUONEB) 0.5-2.5 (3) MG/3ML nebulizer solution 3 mL (has no administration in time range)  budesonide (PULMICORT) nebulizer solution 0.5 mg (has no administration in time range)  arformoterol (BROVANA) nebulizer solution 15 mcg (has no administration in time range)  enoxaparin (LOVENOX) injection 40 mg (has no administration in time  range)  sodium chloride flush (NS) 0.9 % injection 3 mL (has no administration in time range)  ondansetron (ZOFRAN) tablet 4 mg (has no administration in time range)    Or  ondansetron (ZOFRAN) injection 4 mg (has no administration in time range)  acetaminophen (TYLENOL) tablet 650 mg (has no administration in time range)    Or  acetaminophen (TYLENOL) suppository 650 mg (has no administration in time range)  guaiFENesin (MUCINEX) 12 hr tablet 600 mg (has no administration in time range)  azithromycin (ZITHROMAX) 500 mg in sodium chloride 0.9 % 250 mL IVPB (has no administration in time range)  potassium chloride (K-DUR,KLOR-CON) CR tablet 30 mEq (has no administration in time range)  losartan (COZAAR) tablet 50 mg (has no administration in time range)    And  hydrochlorothiazide (MICROZIDE) capsule 12.5 mg (has no administration in time range)  methylPREDNISolone sodium succinate (SOLU-MEDROL) 125 mg/2 mL injection 125 mg (has no administration in time range)  0.9 %  sodium chloride infusion (has no administration in time range)  methylPREDNISolone sodium succinate (SOLU-MEDROL) 125 mg/2 mL injection 60 mg (has no administration in time range)  ipratropium-albuterol (DUONEB) 0.5-2.5 (3) MG/3ML nebulizer solution 3 mL (has no administration in time range)  albuterol (PROVENTIL) (2.5 MG/3ML) 0.083% nebulizer solution 5 mg (5 mg Nebulization Given 07/18/17 1728)  iopamidol (ISOVUE-300) 61 % injection 100 mL (100 mLs Intravenous Contrast Given 07/18/17 1932)  azithromycin (ZITHROMAX) 500 mg in sodium chloride 0.9 % 250 mL IVPB (0 mg Intravenous Stopped 07/18/17 2254)     Initial Impression / Assessment and Plan / ED Course  I have reviewed the triage vital signs and the nursing notes.  Pertinent Myers & imaging results that were available during my care of the patient were reviewed by me and considered in my Myers decision making (see chart for details).     Unclear where the fever is coming  from.  It could be coming from atypical pneumonia given her progressive findings on CT/chest x-ray.  However her urinalysis is also concerning for acute urinary tract infection.  Rocephin should help cover both in addition to some azithromycin.  She was treated with doxycycline and Levaquin a couple months ago but no other anabolic's recently.  She  is quite hypoxic into the low 80s when off of oxygen.  Given her oxygen does not work well at home, she will need admission for supportive care from a respiratory standpoint.  I think she also would benefit from IV antibiotics.  She does not meet criteria for severe sepsis or septic shock given normal lactic acid and no hypotension.  Dr. Tamala Julian to admit.  Final Clinical Impressions(s) / ED Diagnoses   Final diagnoses:  Hypoxia  Atypical pneumonia  Acute UTI    ED Discharge Orders    None       Sherwood Gambler, MD 07/18/17 2342

## 2017-07-18 NOTE — Progress Notes (Signed)
Pt called RT department and cancelled bronchoscopy scheduled for today due to fever, N/V/D. Will follow up to reschedule.

## 2017-07-18 NOTE — ED Triage Notes (Signed)
Patient presented to ed with c/o nausea and vomiting. Patient oxygen saturation 80-85% on room air upon arrival. Patient place on 3 L nasal cannula. Patient coughing and congested.

## 2017-07-18 NOTE — ED Notes (Signed)
Assisted patient to the restroom and back to stretcher. Provided patient a cup of ice water with permission from Dr. Regenia Skeeter.

## 2017-07-18 NOTE — Progress Notes (Signed)
Pharmacy: ceftriaxone  Patient is a 78 y.o F presented to the ED on 07/18/17 with c/o n/v and SOB. UA showed large leukocytes.  Abd CT showed bilateral LL opacities - consistent with PNA.  To start ceftriaxone for UTI/PNA.   Plan: - Ceftriaxone 1 gm IV q24h - No renal adjustment is needed with Ceftriaxone -- pharmacy will sign off - Re-consult [place] if need further assistance  Thank you for asking pharmacy to be part of this patient's care.  Dia Sitter, PharmD, BCPS 07/18/2017 8:50 PM

## 2017-07-18 NOTE — H&P (Signed)
History and Physical    Erin Myers BMW:[id number] DOB: Sep 06, 1940 DOA: 07/18/2017  Referring MD/NP/PA: Dr. Sherwood Gambler PCP: [name], MD  Patient coming from: home  Chief Complaint: Fever  I have personally briefly reviewed patient's old medical records in [place]   HPI: Erin Myers is a 77 y.o. 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  She reports waking up this morning with chills and reports having fever up to 103 F at home with rigors.  Subsequently, patient reports having nausea and vomiting with crampy/aching lower abdominal pain.  Emesis was noted to be nonbloody in appearance.  Patient reports having a productive cough since October 2018 that has significantly worsened since February of this year.  She complains of sinus pressure and postnasal drip which will worsen cough symptoms. Associated symptoms include generalized malaise, wheezing, weight loss of 7 pounds, and poor appetite. Patient reports being treated with 2 rounds of antibiotics(doxycycline 3/19, and another medication) and 2 rounds of steroids without improvement of symptoms.  Currently being followed by Dr. Lenna Gilford and [facility] of pulmonology.  Patient just recently had CT scan of the chest on 5/1, showing a persistent extensive mixed groundglass attenuation and consolidation throughout the right upper lobe with worsening consolidative component and widespread groundglass attenuation throughout both lobes.  Patient was scheduled to have a bronchoscopy, but did not make appointment due to acute symptoms.  She has been currently on steroids.  Patient notes that her home nasal cannula oxygen has not been working and she is only been using her CPAP at night.  Denies any complaints of chest pain, loss of consciousness, or diarrhea.  ED Course: Upon admission into the  emergency department patient was noted to be afebrile, pulse 88-1 03, respiration 15-26, blood pressures maintained, O2 saturations noted as low as 82% requiring patient to be placed on 3 L of nasal cannula oxygen.  Labs revealed WBC 18.9, lactic acid 1.18,  potassium 3.3.  Chest x-ray showing stable cardiomegaly with bilateral groundglass opacities.  Patient was empirically started on ceftriaxone and azithromycin and blood cultures obtained.  TRH called to admit  Review of Systems  Constitutional: Positive for chills, fever, malaise/fatigue and weight loss.  HENT: Positive for congestion, sinus pain and sore throat. Negative for hearing loss.   Eyes: Negative for pain and discharge.  Respiratory: Positive for cough, sputum production, shortness of breath and wheezing.   Cardiovascular: Negative for chest pain and leg swelling.  Gastrointestinal: Positive for abdominal pain, nausea and vomiting.  Genitourinary: Negative for dysuria.  Musculoskeletal: Negative for falls.  Skin: Negative for itching.  Neurological: Positive for weakness. Negative for focal weakness and loss of consciousness.  Psychiatric/Behavioral: Negative for substance abuse.    Past Medical History:  Diagnosis Date  . Anxiety   . Diabetes mellitus without complication ([place])   . Dyslipidemia   . HTN (hypertension)   . Hyperlipemia   . Hypothyroid   . Hypothyroidism   . Lung cancer ([place]) 2000   Carcinoid, s/p RLL lobectomy  . Obesity   . Ovarian cancer ([facility])   . Palpitations 2009 and 2006   Negative nuclear stress test   . Sleep apnea    on C-pap    Past Surgical History:  Procedure Laterality Date  . ABDOMINAL HYSTERECTOMY  2001  . GRADED EXERCISE TOLERANCE TEST  08/2015  Blood pressure demonstrated a hypertensive response to exercise.  There was less than 0.73mm of horizontal ST segment depression in the inferolateral leads. No diagnostic criterial for ischemia. Moderately impaired exercise tolerance. The  patient achieved 5.8 mets. This is a low risk study.  . LOBECTOMY  2000   RLL  . NM MYOCAR PERF WALL MOTION  08/20/2007   EF 74%  EXERCISE CAPACITY 7 METS  . OOPHORECTOMY  2001  . TONSILLECTOMY       reports that she quit smoking about 25 years ago. Her smoking use included cigarettes. She has a 22.50 pack-year smoking history. She has never used smokeless tobacco. She reports that she does not drink alcohol or use drugs.  Allergies  Allergen Reactions  . Adhesive [Tape] Other (See Comments)    Irritates skin   . Codeine Other (See Comments)    Skin crawlinf feeling  . Lactose Intolerance (Gi) Diarrhea  . Monosodium Glutamate Diarrhea  . Sulfa Antibiotics Nausea And Vomiting    Family History  Problem Relation Age of Onset  . Coronary artery disease Mother 92       died of an MI  . Heart attack Mother   . Suicidality Sister   . CVA Brother 40  . CVA Father     Prior to Admission medications   Medication Sig Start Date End Date Taking? Authorizing Provider  aspirin 81 MG tablet Take 81 mg by mouth daily.   Yes [provider]  budesonide-formoterol (SYMBICORT) 160-4.5 MCG/ACT inhaler Inhale 2 puffs into the lungs 2 (two) times daily. 07/03/17  Yes Noralee Space, MD  clorazepate (TRANXENE) 7.5 MG tablet Take 7.5 mg by mouth 2 (two) times daily as needed for anxiety.   Yes [provider]  ipratropium-albuterol (DUONEB) 0.5-2.5 (3) MG/3ML SOLN Take 3 mLs by nebulization 3 (three) times daily. 07/06/17  Yes Noralee Space, MD  levothyroxine (SYNTHROID, LEVOTHROID) 112 MCG tablet Take 112 mcg by mouth daily before breakfast.   Yes [provider]  losartan-hydrochlorothiazide (HYZAAR) 50-12.5 MG per tablet Take 1 tablet by mouth every morning.  11/11/13  Yes [provider]  metoprolol succinate (TOPROL-XL) 50 MG 24 hr tablet Take 50 mg by mouth 2 (two) times daily.    Yes [provider]  predniSONE (DELTASONE) 10 MG tablet Take 1 tablet  (10 mg total) by mouth daily with breakfast. 07/03/17  Yes Noralee Space, MD  rosuvastatin (CRESTOR) 10 MG tablet Take 10 mg by mouth every evening.    Yes [provider]    Physical Exam:  Constitutional: Elderly female who appears to be acutely ill Vitals:   07/18/17 1836 07/18/17 1948 07/18/17 1950 07/18/17 2052  BP: (!) 124/54 (!) 151/67 (!) 151/67 (!) 169/64  Pulse: 94 (!) 103 100 98  Resp: 16 (!) 25 (!) 26 (!) 21  Temp:   98.5 F (36.9 C)   TempSrc:   Oral   SpO2: 91% (!) 89% 92% 93%  Weight:   94.3 kg (208 lb)   Height:   5\' 4"  (1.626 m)    Eyes: PERRL, lids and conjunctivae normal ENMT: Mucous membranes are dry.  Erythematous nasal turbinates.  Inflamed tympanic membranes with mucus fluid present.  Tenderness to palpation of the maxillary sinuses. Respiratory: Tachypneic with positive expiratory wheeze and rhonchi noted.  Patient able to talk and fairly complete sentences on 3 L of nasal cannula oxygen Cardiovascular: Regular rate and rhythm, no murmurs / rubs / gallops. No extremity  edema. 2+ pedal pulses. No carotid bruits.  Abdomen: no tenderness, no masses palpated. No hepatosplenomegaly. Bowel sounds positive.  Musculoskeletal: no clubbing / cyanosis. No joint deformity upper and lower extremities. Good ROM, no contractures. Normal muscle tone.  Skin: no rashes, lesions, ulcers. No induration Neurologic: CN 2-12 grossly intact. Sensation intact, DTR normal. Strength 5/5 in all 4.  Psychiatric: Normal judgment and insight. Alert and oriented x 3. Normal mood.     Labs on Admission: I have personally reviewed following labs and imaging studies  CBC: Recent Labs  Lab 07/18/17 1745  WBC 18.9*  NEUTROABS 15.4*  HGB 16.4*  HCT 50.6*  MCV 95.7  PLT 161   Basic Metabolic Panel: Recent Labs  Lab 07/18/17 1650  NA 138  K 3.3*  CL 99*  CO2 26  GLUCOSE 98  BUN 16  CREATININE 0.84  CALCIUM 8.9   GFR: Estimated Creatinine Clearance: 65.4 mL/min (by  C-G formula based on SCr of 0.84 mg/dL). Liver Function Tests: Recent Labs  Lab 07/18/17 1650  AST 15  ALT 12*  ALKPHOS 48  BILITOT 1.7*  PROT 6.5  ALBUMIN 3.3*   Recent Labs  Lab 07/18/17 1650  LIPASE 22   No results for input(s): AMMONIA in the last 168 hours. Coagulation Profile: No results for input(s): INR, PROTIME in the last 168 hours. Cardiac Enzymes: No results for input(s): CKTOTAL, CKMB, CKMBINDEX, TROPONINI in the last 168 hours. BNP (last 3 results) No results for input(s): PROBNP in the last 8760 hours. HbA1C: No results for input(s): HGBA1C in the last 72 hours. CBG: No results for input(s): GLUCAP in the last 168 hours. Lipid Profile: No results for input(s): CHOL, HDL, LDLCALC, TRIG, CHOLHDL, LDLDIRECT in the last 72 hours. Thyroid Function Tests: No results for input(s): TSH, T4TOTAL, FREET4, T3FREE, THYROIDAB in the last 72 hours. Anemia Panel: No results for input(s): VITAMINB12, FOLATE, FERRITIN, TIBC, IRON, RETICCTPCT in the last 72 hours. Urine analysis:    Component Value Date/Time   COLORURINE YELLOW 07/18/2017 1650   APPEARANCEUR CLEAR 07/18/2017 1650   LABSPEC 1.005 07/18/2017 1650   PHURINE 7.0 07/18/2017 1650   GLUCOSEU NEGATIVE 07/18/2017 1650   HGBUR NEGATIVE 07/18/2017 1650   BILIRUBINUR NEGATIVE 07/18/2017 1650   KETONESUR NEGATIVE 07/18/2017 1650   PROTEINUR NEGATIVE 07/18/2017 1650   NITRITE NEGATIVE 07/18/2017 1650   LEUKOCYTESUR LARGE (A) 07/18/2017 1650   Sepsis Labs: No results found for this or any previous visit (from the past 240 hour(s)).   Radiological Exams on Admission: Dg Chest 2 View  Result Date: 07/18/2017 CLINICAL DATA:  77 year old female with nausea vomiting. Fever. Prior right lung surgery for carcinoid. Cough since October 2018. EXAM: CHEST - 2 VIEW COMPARISON:  High-resolution chest CT 07/12/2017 and earlier. FINDINGS: Progressed upper and lower lung ground-glass opacity since 07/03/2017 radiographs, maximal  in the right upper lobe. No consolidation. Findings are likely stable from the recent CT. No superimposed pneumothorax or pleural effusion. Visualized tracheal air column is within normal limits. Mediastinal contours remain normal. No acute osseous abnormality identified. Paucity of bowel gas in the upper abdomen. IMPRESSION: 1. Abnormal bilateral pulmonary ground-glass opacity, maximal in the right upper lobe. No associated pleural effusion. 2. These lung findings are probably not significantly changed from the recent High-resolution Chest CT on 07/12/2017. The presence of fever would favor atypical infection, but please see that CT report regarding all differential diagnostic considerations. Electronically Signed   By: Genevie Ann M.D.   On: 07/18/2017 17:31  Ct Abdomen Pelvis W Contrast  Result Date: 07/18/2017 CLINICAL DATA:  Nausea and vomiting.  Cough and congestion. EXAM: CT ABDOMEN AND PELVIS WITH CONTRAST TECHNIQUE: Multidetector CT imaging of the abdomen and pelvis was performed using the standard protocol following bolus administration of intravenous contrast. CONTRAST:  160mL ISOVUE-300 IOPAMIDOL (ISOVUE-300) INJECTION 61% COMPARISON:  CT chest 07/12/2017. FINDINGS: Lower chest: Ground-glass attenuation throughout the lower lobes, LEFT greater than RIGHT, with consolidation, mildly increased from prior CT chest. Emphysematous changes. Hepatobiliary: No focal liver abnormality is seen. No gallstones, gallbladder wall thickening, or biliary dilatation. Pancreas: Unremarkable. No pancreatic ductal dilatation or surrounding inflammatory changes. Spleen: Normal in size without focal abnormality. Adrenals/Urinary Tract: Adrenal glands are unremarkable. Kidneys are normal, without renal calculi, focal lesion, or hydronephrosis. Bladder is unremarkable. Stomach/Bowel: Stomach is within normal limits. No visible appendiceal inflammation, may be surgically absent. No evidence of bowel wall thickening, distention,  or inflammatory changes. Vascular/Lymphatic: Aortic atherosclerosis. No enlarged abdominal or pelvic lymph nodes. Reproductive: Status post hysterectomy. No adnexal masses. Other: Small fat containing umbilical hernia. Musculoskeletal: No acute or significant osseous findings. IMPRESSION: No acute or worrisome findings in the abdomen and pelvis. No bowel obstruction, free fluid, or free air. Appendix not visualized, may be surgically absent. BILATERAL lower lobe opacities, greater on the LEFT, are mildly increased from CT chest several days ago. Correlate clinically for pneumonia. Aortic Atherosclerosis (ICD10-I70.0) and Emphysema (ICD10-J43.9). Electronically Signed   By: Staci Righter M.D.   On: 07/18/2017 20:20    EKG: Independently reviewed.  Normal sinus rhythm at 89 bpm  Assessment/Plan Sepsis 2/2 sinusitis, possible community-acquired pneumonia: Patient reports having fever up to 103 F at home and presents with tachypnea and tachycardia.  WBC elevated at 18.9, but lactic acid was reassuring at 1.18.  Patient has had no improvement with treatment with previous medications of doxycycline.  On physical exam suspect patient has sinusitis, but possible community-acquired pneumonia vs. urinary tract infection also on differential. Patient was initially treated with ceftriaxone and azithromycin. - Admit to a telemetry bed - Panculture - Check respiratory virus panel - Add on procalcitonin - Changed empiric antibiotics to vancomycin and cefepime - Mucinex  Acute on chronic respiratory failure with hypoxia, bronchitis: O2 saturations noted as low as 82% on admission with wheezing on physical exam.  Patient oxygenation improved on 3 L of nasal cannula oxygen. - Continuous pulse oximetry with nasal cannula oxygen as needed - Check d-dimer - DuoNeb's 4 times daily and prn - Brovana and Budesonide nebs - Solu-Medrol IV  Abnormal CT chest/groundglass opacities: As seen on recent CT scan of the chest  with worsening consolidative appearance.  Patient was scheduled to have a bronchoscopy by Dr. Malvin Johns. - Will need to consult Dr. Malvin Johns in a.m. and determine if patient should undergo bronchoscopy while inpatient  Hypokalemia: Acute initial potassium noted to be 3.3 on admission. - Give 30 mEq of potassium chloride  - Continue to monitor and replace as needed  Essential hypertension - Continue Hyzaar, metoprolol   Hypothyroidism: TSH noted to be 0.49 on 05/02/2017. - Follow-up TSH  - Continue levothyroxine  Anxiety - Continue clorazepate  OSA on CPAP - CPAP per RT  DVT prophylaxis:lovenox  Code Status: Full Family Communication: No family present at bedside Disposition Plan: To be determined Consults called: none Admission status: Patient  Norval Morton MD [facility] Pager [phone]   If 7PM-7AM, please contact night-coverage [email or web address] Password [email or web address]  07/18/2017, 9:07 PM

## 2017-07-18 NOTE — Telephone Encounter (Signed)
Per SN patient needs to be seen in the ER. If she does not want to wait long then she can go by EMS. Advised husband of SN response. They are going to ER.

## 2017-07-18 NOTE — ED Notes (Signed)
Gave report to Gershon Mussel, RN for room [phone].

## 2017-07-18 NOTE — ED Notes (Signed)
ED TO INPATIENT HANDOFF REPORT  Name/Age/Gender Erin Myers 77 y.o. female  Code Status    Code Status Orders  (From admission, onward)        Start     Ordered   07/18/17 2207  Full code  Continuous     07/18/17 2209    Code Status History    This patient has a current code status but no historical code status.    Advance Directive Documentation     Most Recent Value  Type of Advance Directive  Healthcare Power of Attorney, Living will  Pre-existing out of facility DNR order (yellow form or pink MOST form)  -  "MOST" Form in Place?  -      Home/SNF/Other Home  Chief Complaint emesis / possible fever   Level of Care/Admitting Diagnosis ED Disposition    ED Disposition Condition [place]: [place] [[id number]]  Level of Care: Telemetry [5]  Admit to tele based on following criteria: Complex arrhythmia (Bradycardia/Tachycardia)  Diagnosis: Sepsis [facility]) [[id number]]  Admitting Physician: Norval Morton [[id number]]  Attending Physician: Norval Morton [[id number]]  Estimated length of stay: past midnight tomorrow  Certification:: I certify this patient will need inpatient services for at least 2 midnights  PT Class (Do Not Modify): Inpatient [101]  PT Acc Code (Do Not Modify): Private [1]       Medical History Past Medical History:  Diagnosis Date  . Anxiety   . Diabetes mellitus without complication ([place])   . Dyslipidemia   . HTN (hypertension)   . Hyperlipemia   . Hypothyroid   . Hypothyroidism   . 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 Obesity   . Ovarian cancer ([place])   . Palpitations 2009 and 2006   Negative nuclear stress test   . Sleep apnea    on C-pap    Allergies Allergies  Allergen Reactions  . Adhesive [Tape] Other (See Comments)    Irritates skin   . Codeine Other (See Comments)    Skin crawlinf feeling  . Lactose Intolerance (Gi) Diarrhea  . Monosodium Glutamate  Diarrhea  . Sulfa Antibiotics Nausea And Vomiting    IV Location/Drains/Wounds Patient Lines/Drains/Airways Status   Active Line/Drains/Airways    Name:   Placement date:   Placement time:   Site:   Days:   Peripheral IV 07/18/17 Left Hand   07/18/17    1747    Hand   less than 1          Labs/Imaging Results for orders placed or performed during the hospital encounter of 07/18/17 (from the past 48 hour(s))  Comprehensive metabolic panel     Status: Abnormal   Collection Time: 07/18/17  4:50 PM  Result Value Ref Range   Sodium 138 135 - 145 mmol/L   Potassium 3.3 (L) 3.5 - 5.1 mmol/L   Chloride 99 (L) 101 - 111 mmol/L   CO2 26 22 - 32 mmol/L   Glucose, Bld 98 65 - 99 mg/dL   BUN 16 6 - 20 mg/dL   Creatinine, Ser 0.84 0.44 - 1.00 mg/dL   Calcium 8.9 8.9 - 10.3 mg/dL   Total Protein 6.5 6.5 - 8.1 g/dL   Albumin 3.3 (L) 3.5 - 5.0 g/dL   AST 15 15 - 41 U/L   ALT 12 (L) 14 - 54 U/L   Alkaline Phosphatase 48 38 - 126 U/L   Total Bilirubin  1.7 (H) 0.3 - 1.2 mg/dL   GFR calc non Af Amer >60 >60 mL/min   GFR calc Af Amer >60 >60 mL/min    Comment: (NOTE) The eGFR has been calculated using the CKD EPI equation. This calculation has not been validated in all clinical situations. eGFR's persistently <60 mL/min signify possible Chronic Kidney Disease.    Anion gap 13 5 - 15    Comment: Performed at [facility], [place] [address] [place], [place] [address]  Urinalysis, Routine w reflex microscopic     Status: Abnormal   Collection Time: 07/18/17  4:50 PM  Result Value Ref Range   Color, Urine YELLOW YELLOW   APPearance CLEAR CLEAR   Specific Gravity, Urine 1.005 1.005 - 1.030   pH 7.0 5.0 - 8.0   Glucose, UA NEGATIVE NEGATIVE mg/dL   Hgb urine dipstick NEGATIVE NEGATIVE   Bilirubin Urine NEGATIVE NEGATIVE   Ketones, ur NEGATIVE NEGATIVE mg/dL   Protein, ur NEGATIVE NEGATIVE mg/dL   Nitrite NEGATIVE NEGATIVE   Leukocytes, UA LARGE (A) NEGATIVE   RBC / HPF  0-5 0 - 5 RBC/hpf   WBC, UA 21-50 0 - 5 WBC/hpf   Bacteria, UA NONE SEEN NONE SEEN   Squamous Epithelial / LPF 0-5 0 - 5    Comment: Please note change in reference range. Performed at [facility], [place] [address] [place], [place] [address]   Lipase, blood     Status: None   Collection Time: 07/18/17  4:50 PM  Result Value Ref Range   Lipase 22 11 - 51 U/L    Comment: Performed at [facility], [place] [address], [place], [place] [address]  I-Stat CG4 Lactic Acid, ED  (not at  [facility])     Status: None   Collection Time: 07/18/17  5:09 PM  Result Value Ref Range   Lactic Acid, Venous 1.18 0.5 - 1.9 mmol/L  CBC with Differential/Platelet     Status: Abnormal   Collection Time: 07/18/17  5:45 PM  Result Value Ref Range   WBC 18.9 (H) 4.0 - 10.5 K/uL   RBC 5.29 (H) 3.87 - 5.11 MIL/uL   Hemoglobin 16.4 (H) 12.0 - 15.0 g/dL   HCT 50.6 (H) 36.0 - 46.0 %   MCV 95.7 78.0 - 100.0 fL   MCH 31.0 26.0 - 34.0 pg   MCHC 32.4 30.0 - 36.0 g/dL   RDW 15.9 (H) 11.5 - 15.5 %   Platelets 238 150 - 400 K/uL   Neutrophils Relative % 82 %   Neutro Abs 15.4 (H) 1.7 - 7.7 K/uL   Lymphocytes Relative 10 %   Lymphs Abs 1.8 0.7 - 4.0 K/uL   Monocytes Relative 8 %   Monocytes Absolute 1.5 (H) 0.1 - 1.0 K/uL   Eosinophils Relative 0 %   Eosinophils Absolute 0.1 0.0 - 0.7 K/uL   Basophils Relative 0 %   Basophils Absolute 0.0 0.0 - 0.1 K/uL    Comment: Performed at [facility], [place] [address], [place], [place] [address]   Dg Chest 2 View  Result Date: 07/18/2017 CLINICAL DATA:  77 year old female with nausea vomiting. Fever. Prior right lung surgery for carcinoid. Cough since October 2018. EXAM: CHEST - 2 VIEW COMPARISON:  High-resolution chest CT 07/12/2017 and earlier. FINDINGS: Progressed upper and lower lung ground-glass opacity since 07/03/2017 radiographs, maximal in the right upper lobe. No consolidation. Findings are likely stable from the recent  CT. No superimposed pneumothorax  or pleural effusion. Visualized tracheal air column is within normal limits. Mediastinal contours remain normal. No acute osseous abnormality identified. Paucity of bowel gas in the upper abdomen. IMPRESSION: 1. Abnormal bilateral pulmonary ground-glass opacity, maximal in the right upper lobe. No associated pleural effusion. 2. These lung findings are probably not significantly changed from the recent High-resolution Chest CT on 07/12/2017. The presence of fever would favor atypical infection, but please see that CT report regarding all differential diagnostic considerations. Electronically Signed   By: Genevie Ann M.D.   On: 07/18/2017 17:31   Ct Abdomen Pelvis W Contrast  Result Date: 07/18/2017 CLINICAL DATA:  Nausea and vomiting.  Cough and congestion. EXAM: CT ABDOMEN AND PELVIS WITH CONTRAST TECHNIQUE: Multidetector CT imaging of the abdomen and pelvis was performed using the standard protocol following bolus administration of intravenous contrast. CONTRAST:  158m ISOVUE-300 IOPAMIDOL (ISOVUE-300) INJECTION 61% COMPARISON:  CT chest 07/12/2017. FINDINGS: Lower chest: Ground-glass attenuation throughout the lower lobes, LEFT greater than RIGHT, with consolidation, mildly increased from prior CT chest. Emphysematous changes. Hepatobiliary: No focal liver abnormality is seen. No gallstones, gallbladder wall thickening, or biliary dilatation. Pancreas: Unremarkable. No pancreatic ductal dilatation or surrounding inflammatory changes. Spleen: Normal in size without focal abnormality. Adrenals/Urinary Tract: Adrenal glands are unremarkable. Kidneys are normal, without renal calculi, focal lesion, or hydronephrosis. Bladder is unremarkable. Stomach/Bowel: Stomach is within normal limits. No visible appendiceal inflammation, may be surgically absent. No evidence of bowel wall thickening, distention, or inflammatory changes. Vascular/Lymphatic: Aortic atherosclerosis. No enlarged  abdominal or pelvic lymph nodes. Reproductive: Status post hysterectomy. No adnexal masses. Other: Small fat containing umbilical hernia. Musculoskeletal: No acute or significant osseous findings. IMPRESSION: No acute or worrisome findings in the abdomen and pelvis. No bowel obstruction, free fluid, or free air. Appendix not visualized, may be surgically absent. BILATERAL lower lobe opacities, greater on the LEFT, are mildly increased from CT chest several days ago. Correlate clinically for pneumonia. Aortic Atherosclerosis (ICD10-I70.0) and Emphysema (ICD10-J43.9). Electronically Signed   By: JStaci RighterM.D.   On: 07/18/2017 20:20    Pending Labs Unresulted Labs (From admission, onward)   Start     Ordered   07/18/17 2208  Legionella Pneumophila Serogp 1 Ur Ag  Once,   R     07/18/17 2209   07/18/17 2208  Procalcitonin  Add-on,   R     07/18/17 2209   07/18/17 2207  TSH  Add-on,   R     07/18/17 2209   07/18/17 2206  Culture, sputum-assessment  Once,   R     07/18/17 2209   07/18/17 2206  Gram stain  Once,   R     07/18/17 2209   07/18/17 2206  Strep pneumoniae urinary antigen  Once,   R     07/18/17 2209   07/18/17 1745  [facility]  Once,   AD     07/18/17 1745   07/18/17 1637  CBC WITH DIFFERENTIAL  STAT,   STAT     07/18/17 1637   07/18/17 1637  Blood Culture (routine x 2)  BLOOD CULTURE X 2,   STAT     07/18/17 1637   07/18/17 1637  Urine culture  STAT,   STAT     07/18/17 1637      Vitals/Pain Today's Vitals   07/18/17 2052 07/18/17 2100 07/18/17 2115 07/18/17 2130  BP: (!) 169/64 (!) 147/50 (!) 153/61 (!) 142/50  Pulse: 98 88 92 95  Resp: (!) 21 19 15  (!) 25  Temp:      TempSrc:      SpO2: 93% 90% 92% 91%  Weight:      Height:      PainSc:        Isolation Precautions No active isolations  Medications Medications  iopamidol (ISOVUE-300) 61 % injection (has no administration in time range)  cefTRIAXone (ROCEPHIN) 1 g in sodium  chloride 0.9 % 100 mL IVPB (has no administration in time range)  metoprolol succinate (TOPROL-XL) 24 hr tablet 50 mg (has no administration in time range)  rosuvastatin (CRESTOR) tablet 10 mg (has no administration in time range)  levothyroxine (SYNTHROID, LEVOTHROID) tablet 112 mcg (has no administration in time range)  clorazepate (TRANXENE) tablet 7.5 mg (has no administration in time range)  ipratropium-albuterol (DUONEB) 0.5-2.5 (3) MG/3ML nebulizer solution 3 mL (has no administration in time range)  budesonide (PULMICORT) nebulizer solution 0.5 mg (has no administration in time range)  arformoterol (BROVANA) nebulizer solution 15 mcg (has no administration in time range)  aspirin chewable tablet 81 mg (has no administration in time range)  enoxaparin (LOVENOX) injection 40 mg (has no administration in time range)  sodium chloride flush (NS) 0.9 % injection 3 mL (has no administration in time range)  ondansetron (ZOFRAN) tablet 4 mg (has no administration in time range)    Or  ondansetron (ZOFRAN) injection 4 mg (has no administration in time range)  acetaminophen (TYLENOL) tablet 650 mg (has no administration in time range)    Or  acetaminophen (TYLENOL) suppository 650 mg (has no administration in time range)  guaiFENesin (MUCINEX) 12 hr tablet 600 mg (has no administration in time range)  azithromycin (ZITHROMAX) 500 mg in sodium chloride 0.9 % 250 mL IVPB (has no administration in time range)  potassium chloride (K-DUR,KLOR-CON) CR tablet 30 mEq (has no administration in time range)  losartan (COZAAR) tablet 50 mg (has no administration in time range)    And  hydrochlorothiazide (MICROZIDE) capsule 12.5 mg (has no administration in time range)  albuterol (PROVENTIL) (2.5 MG/3ML) 0.083% nebulizer solution 5 mg (5 mg Nebulization Given 07/18/17 1728)  iopamidol (ISOVUE-300) 61 % injection 100 mL (100 mLs Intravenous Contrast Given 07/18/17 1932)  azithromycin (ZITHROMAX) 500 mg in  sodium chloride 0.9 % 250 mL IVPB (500 mg Intravenous New Bag/Given 07/18/17 2111)    Mobility walks with person assist

## 2017-07-18 NOTE — Telephone Encounter (Signed)
Spoke with pt's husband, he states pt is throwing up (clear mucus), fever, which started last night around midnight. She took some advil for the fever but unable to eat anything. She was supposed to have a biopsy done today with RB but the hospital advised her to cancel since she has fever . She is very weak and can hardly get out of bed. I advised him to take her to emergency room but he states she is so weak and would not be able to sit there for hours . I asked about her PCP and he stated he wanted advice from [place] since RB was going to do a procedure today. I advised him that it would be hard to evaluate this over the phone. Please advise SN.   [facility]   Current Outpatient Medications on File Prior to Visit  Medication Sig Dispense Refill  . aspirin 81 MG tablet Take 81 mg by mouth daily.    . budesonide-formoterol (SYMBICORT) 160-4.5 MCG/ACT inhaler Inhale 2 puffs into the lungs 2 (two) times daily. 1 Inhaler 6  . clorazepate (TRANXENE) 7.5 MG tablet Take 7.5 mg by mouth 2 (two) times daily as needed for anxiety.    [name] ipratropium-albuterol (DUONEB) 0.5-2.5 (3) MG/3ML SOLN Take 3 mLs by nebulization 3 (three) times daily. 360 mL 2  . levothyroxine (SYNTHROID, LEVOTHROID) 112 MCG tablet Take 112 mcg by mouth daily before breakfast.    . losartan-hydrochlorothiazide (HYZAAR) 50-12.5 MG per tablet Take 1 tablet by mouth daily.     . metoprolol succinate (TOPROL-XL) 50 MG 24 hr tablet Take 50 mg by mouth 2 (two) times daily.     . predniSONE (DELTASONE) 10 MG tablet Take 1 tablet (10 mg total) by mouth daily with breakfast. 30 tablet 0  . rosuvastatin (CRESTOR) 10 MG tablet Take 10 mg by mouth daily.     No current facility-administered medications on file prior to visit.    Allergies  Allergen Reactions  . Adhesive [Tape] Other (See Comments)    Irritates skin   . Codeine Other (See Comments)    Skin crawlinf feeling  . Lactose Intolerance (Gi) Diarrhea  . Sulfa Antibiotics Nausea And  Vomiting

## 2017-07-19 DIAGNOSIS — E876 Hypokalemia: Secondary | ICD-10-CM | POA: Diagnosis present

## 2017-07-19 DIAGNOSIS — R9389 Abnormal findings on diagnostic imaging of other specified body structures: Secondary | ICD-10-CM

## 2017-07-19 DIAGNOSIS — J9621 Acute and chronic respiratory failure with hypoxia: Secondary | ICD-10-CM | POA: Diagnosis present

## 2017-07-19 DIAGNOSIS — J329 Chronic sinusitis, unspecified: Secondary | ICD-10-CM | POA: Diagnosis present

## 2017-07-19 LAB — BASIC METABOLIC PANEL WITH GFR
Anion gap: 9 (ref 5–15)
BUN: 14 mg/dL (ref 6–20)
CO2: 24 mmol/L (ref 22–32)
Calcium: 8.1 mg/dL — ABNORMAL LOW (ref 8.9–10.3)
Chloride: 104 mmol/L (ref 101–111)
Creatinine, Ser: 0.82 mg/dL (ref 0.44–1.00)
GFR calc Af Amer: >60 mL/min
GFR calc non Af Amer: >60 mL/min
Glucose, Bld: 167 mg/dL — ABNORMAL HIGH (ref 65–99)
Potassium: 3.8 mmol/L (ref 3.5–5.1)
Sodium: 137 mmol/L (ref 135–145)

## 2017-07-19 LAB — GLUCOSE, CAPILLARY
Glucose-Capillary: 209 mg/dL — ABNORMAL HIGH (ref 65–99)
Glucose-Capillary: 221 mg/dL — ABNORMAL HIGH (ref 65–99)

## 2017-07-19 LAB — CBC WITH DIFFERENTIAL/PLATELET
BASOS PCT: 0 %
Basophils Absolute: 0 10*3/uL (ref 0.0–0.1)
Eosinophils Absolute: 0 10*3/uL (ref 0.0–0.7)
Eosinophils Relative: 0 %
HEMATOCRIT: 48.1 % — AB (ref 36.0–46.0)
HEMOGLOBIN: 15.7 g/dL — AB (ref 12.0–15.0)
LYMPHS ABS: 0.8 10*3/uL (ref 0.7–4.0)
Lymphocytes Relative: 5 %
MCH: 31 pg (ref 26.0–34.0)
MCHC: 32.6 g/dL (ref 30.0–36.0)
MCV: 95.1 fL (ref 78.0–100.0)
Monocytes Absolute: 0.1 10*3/uL (ref 0.1–1.0)
Monocytes Relative: 1 %
NEUTROS ABS: 15.5 10*3/uL — AB (ref 1.7–7.7)
NEUTROS PCT: 94 %
Platelets: 219 10*3/uL (ref 150–400)
RBC: 5.06 MIL/uL (ref 3.87–5.11)
RDW: 15.7 % — ABNORMAL HIGH (ref 11.5–15.5)
WBC: 16.4 10*3/uL — ABNORMAL HIGH (ref 4.0–10.5)

## 2017-07-19 LAB — RESPIRATORY PANEL BY PCR
Adenovirus: NOT DETECTED
Bordetella pertussis: NOT DETECTED
CORONAVIRUS NL63-RVPPCR: NOT DETECTED
CORONAVIRUS OC43-RVPPCR: NOT DETECTED
Chlamydophila pneumoniae: NOT DETECTED
Coronavirus 229E: NOT DETECTED
Coronavirus HKU1: NOT DETECTED
INFLUENZA A-RVPPCR: NOT DETECTED
INFLUENZA B-RVPPCR: NOT DETECTED
METAPNEUMOVIRUS-RVPPCR: NOT DETECTED
Mycoplasma pneumoniae: NOT DETECTED
PARAINFLUENZA VIRUS 1-RVPPCR: NOT DETECTED
PARAINFLUENZA VIRUS 2-RVPPCR: NOT DETECTED
PARAINFLUENZA VIRUS 3-RVPPCR: NOT DETECTED
PARAINFLUENZA VIRUS 4-RVPPCR: NOT DETECTED
RESPIRATORY SYNCYTIAL VIRUS-RVPPCR: NOT DETECTED
RHINOVIRUS / ENTEROVIRUS - RVPPCR: NOT DETECTED

## 2017-07-19 LAB — STREP PNEUMONIAE URINARY ANTIGEN: Strep Pneumo Urinary Antigen: NEGATIVE

## 2017-07-19 LAB — PROCALCITONIN: Procalcitonin: 0.26 ng/mL

## 2017-07-19 LAB — HEMOGLOBIN A1C
Hgb A1c MFr Bld: 6.2 % — ABNORMAL HIGH (ref 4.8–5.6)
Mean Plasma Glucose: 131.24 mg/dL

## 2017-07-19 MED ORDER — PREDNISONE 20 MG PO TABS
20.0000 mg | ORAL_TABLET | Freq: Every day | ORAL | Status: DC
  Administered 2017-07-20 – 2017-07-21 (×2): 20 mg via ORAL
  Filled 2017-07-19 (×3): qty 1

## 2017-07-19 MED ORDER — AMOXICILLIN-POT CLAVULANATE 875-125 MG PO TABS
1.0000 | ORAL_TABLET | Freq: Two times a day (BID) | ORAL | Status: DC
  Administered 2017-07-19 – 2017-07-21 (×4): 1 via ORAL
  Filled 2017-07-19 (×4): qty 1

## 2017-07-19 MED ORDER — INSULIN ASPART 100 UNIT/ML ~~LOC~~ SOLN: 0.0000 [IU] | Freq: Every day | SUBCUTANEOUS | Status: DC

## 2017-07-19 MED ORDER — VANCOMYCIN HCL IN DEXTROSE 1-5 GM/200ML-% IV SOLN: 1000.0000 mg | INTRAVENOUS | Status: DC

## 2017-07-19 MED ORDER — INSULIN ASPART 100 UNIT/ML ~~LOC~~ SOLN
0.0000 [IU] | Freq: Three times a day (TID) | SUBCUTANEOUS | Status: DC
  Administered 2017-07-19 (×2): 3 [IU] via SUBCUTANEOUS
  Administered 2017-07-20: 1 [IU] via SUBCUTANEOUS

## 2017-07-19 MED ORDER — PREDNISONE 20 MG PO TABS: 40.0000 mg | ORAL_TABLET | Freq: Every day | ORAL | Status: DC

## 2017-07-19 NOTE — H&P (View-Only) (Signed)
Name: Erin Myers MRN: [id number] DOB: 30-Nov-1940    ADMISSION DATE:  07/18/2017 CONSULTATION DATE:  07/19/17  REFERRING MD : Dr. Florene Glen  Reason for consultation: Unexplained bilateral pulmonary infiltrates  STUDIES:  CT chest 5/1 >> extensive bilateral groundglass pulmonary infiltrates most significant and with some air bronchograms and evidence for consolidation in the right upper lobe.  Also noted in the bilateral lower lobes.   HISTORY OF PRESENT ILLNESS:   77 year old former smoker (23 pack years) whom I have met in the office.  She is been followed by Dr. Lenna Gilford and has a history of diabetes, hypertension, hyperlipidemia, hypothyroidism, sleep apnea.  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  She has been under evaluation for several months for progressive dyspnea, hypoxemia and focal right upper lobe groundglass infiltrates.  She is been treated with corticosteroids and previously with antibiotics without any resolution.  In fact her infiltrates appear to be progressive, now involving the bilateral lower lobes on CT scan from 5/1.  Her RF, ANA are negative, ESR is normal.   She is admitted now for fever, nausea, emesis.  No new clear source of infection identified. She remains on prednisone 66m daily as an outpt.  PCCM consulted today for further evaluation of her bilateral infiltrates.   PAST MEDICAL HISTORY :   has a past medical history of Anxiety, Diabetes mellitus without complication ([place], Dyslipidemia, HTN (hypertension), Hyperlipemia, Hypothyroid, Hypothyroidism, [place] cancer ([place] ([place]), Obesity, [place], Palpitations (2009 and 2006), and Sleep apnea.  has a past surgical history that includes Oophorectomy (2001); Abdominal hysterectomy (2001); Lobectomy (2000); NM MYOCAR PERF WALL MOTION (08/20/2007); Tonsillectomy; and GRADED EXERCISE TOLERANCE TEST (08/2015). Prior to Admission medications   Medication Sig Start  Date End Date Taking? Authorizing Provider  aspirin 81 MG tablet Take 81 mg by mouth daily.   Yes [provider]  budesonide-formoterol (SYMBICORT) 160-4.5 MCG/ACT inhaler Inhale 2 puffs into the lungs 2 (two) times daily. 07/03/17  Yes NNoralee Space MD  clorazepate (TRANXENE) 7.5 MG tablet Take 7.5 mg by mouth 2 (two) times daily as needed for anxiety.   Yes [provider]  ipratropium-albuterol (DUONEB) 0.5-2.5 (3) MG/3ML SOLN Take 3 mLs by nebulization 3 (three) times daily. 07/06/17  Yes NNoralee Space MD  levothyroxine (SYNTHROID, LEVOTHROID) 112 MCG tablet Take 112 mcg by mouth daily before breakfast.   Yes [provider]  losartan-hydrochlorothiazide (HYZAAR) 50-12.5 MG per tablet Take 1 tablet by mouth every morning.  11/11/13  Yes [provider]  metoprolol succinate (TOPROL-XL) 50 MG 24 hr tablet Take 50 mg by mouth 2 (two) times daily.    Yes [provider]  predniSONE (DELTASONE) 10 MG tablet Take 1 tablet (10 mg total) by mouth daily with breakfast. 07/03/17  Yes NNoralee Space MD  rosuvastatin (CRESTOR) 10 MG tablet Take 10 mg by mouth every evening.    Yes [provider]   Allergies  Allergen Reactions  . Adhesive [Tape] Other (See Comments)    Irritates skin   . Codeine Other (See Comments)    Skin crawlinf feeling  . Lactose Intolerance (Gi) Diarrhea  . Monosodium Glutamate Diarrhea  . Sulfa Antibiotics Nausea And Vomiting    FAMILY HISTORY:  family history includes CVA in her father; CVA (age of onset: 745 in her brother; Coronary artery disease (age of onset: 596 in her mother; Heart attack in her mother; Suicidality in her sister. SOCIAL HISTORY:  reports that she quit smoking about 25 years ago. Her smoking use included cigarettes. She has a 22.50 pack-year smoking history. She has never used smokeless tobacco. She reports that she does not drink alcohol or use drugs.  REVIEW OF SYSTEMS:   Constitutional:  Positive fever, chills, negative weight loss, malaise/fatigue and diaphoresis.  HENT: Negative for hearing loss, ear pain, nosebleeds, congestion, sore throat, neck pain, tinnitus and ear discharge.   Eyes: Negative for blurred vision, double vision, photophobia, pain, discharge and redness.  Respiratory: Negative for cough, hemoptysis, sputum production, shortness of breath, wheezing and stridor.   Cardiovascular: Negative for chest pain, palpitations, orthopnea, claudication, leg swelling and PND.  Gastrointestinal: Negative for heartburn, nausea, vomiting, abdominal pain, diarrhea, constipation, blood in stool and melena.  Genitourinary: Negative for dysuria, urgency, frequency, hematuria and flank pain.  Musculoskeletal: Negative for myalgias, back pain, joint pain and falls.  Skin: Negative for itching and rash.  Neurological: Negative for dizziness, tingling, tremors, sensory change, speech change, focal weakness, seizures, loss of consciousness, weakness and headaches.  Endo/Heme/Allergies: Negative for environmental allergies and polydipsia. Does not bruise/bleed easily.  SUBJECTIVE:   VITAL SIGNS: Temp:  [97.4 F (36.3 C)-102.7 F (39.3 C)] 97.4 F (36.3 C) (05/08 0501) Pulse Rate:  [88-103] 98 (05/08 0957) Resp:  [15-26] 16 (05/08 0501) BP: (122-169)/(50-70) 154/70 (05/08 0957) SpO2:  [82 %-95 %] 91 % (05/08 1156) FiO2 (%):  [28 %] 28 % (05/08 1156) Weight:  [92.6 kg (204 lb 2.3 oz)-94.3 kg (208 lb)] 92.6 kg (204 lb 2.3 oz) (05/07 2321)  PHYSICAL EXAMINATION: General:  Pleasant obese woman, NAD Neuro: Awake, alert, nonfocal HEENT: Oropharynx clear, no evidence of thrush, pupils equal and reactive Cardiovascular: Regular, no murmur Lungs: Distant, scattered inspiratory crackles more so on the right Abdomen: Soft, obese, nontender, positive bowel sounds Musculoskeletal: No deformity Skin: No rash  Recent Labs  Lab 07/18/17 1650 07/19/17 0616  NA 138 137  K 3.3* 3.8    CL 99* 104  CO2 26 24  BUN 16 14  CREATININE 0.84 0.82  GLUCOSE 98 167*   Recent Labs  Lab 07/18/17 1745 07/19/17 0616  HGB 16.4* 15.7*  HCT 50.6* 48.1*  WBC 18.9* 16.4*  PLT 238 219   Dg Chest 2 View  Result Date: 07/18/2017 CLINICAL DATA:  77 year old female with nausea vomiting. Fever. Prior right lung surgery for carcinoid. Cough since October 2018. EXAM: CHEST - 2 VIEW COMPARISON:  High-resolution chest CT 07/12/2017 and earlier. FINDINGS: Progressed upper and lower lung ground-glass opacity since 07/03/2017 radiographs, maximal in the right upper lobe. No consolidation. Findings are likely stable from the recent CT. No superimposed pneumothorax or pleural effusion. Visualized tracheal air column is within normal limits. Mediastinal contours remain normal. No acute osseous abnormality identified. Paucity of bowel gas in the upper abdomen. IMPRESSION: 1. Abnormal bilateral pulmonary ground-glass opacity, maximal in the right upper lobe. No associated pleural effusion. 2. These lung findings are probably not significantly changed from the recent High-resolution Chest CT on 07/12/2017. The presence of fever would favor atypical infection, but please see that CT report regarding all differential diagnostic considerations. Electronically Signed   By: Genevie Ann M.D.   On: 07/18/2017 17:31   Ct Abdomen Pelvis W Contrast  Result Date: 07/18/2017 CLINICAL DATA:  Nausea and vomiting.  Cough and congestion. EXAM: CT ABDOMEN AND PELVIS WITH CONTRAST TECHNIQUE: Multidetector CT imaging of the abdomen and pelvis was performed using the standard protocol following bolus administration of intravenous contrast.  CONTRAST:  151m ISOVUE-300 IOPAMIDOL (ISOVUE-300) INJECTION 61% COMPARISON:  CT chest 07/12/2017. FINDINGS: Lower chest: Ground-glass attenuation throughout the lower lobes, LEFT greater than RIGHT, with consolidation, mildly increased from prior CT chest. Emphysematous changes. Hepatobiliary: No  focal liver abnormality is seen. No gallstones, gallbladder wall thickening, or biliary dilatation. Pancreas: Unremarkable. No pancreatic ductal dilatation or surrounding inflammatory changes. Spleen: Normal in size without focal abnormality. Adrenals/Urinary Tract: Adrenal glands are unremarkable. Kidneys are normal, without renal calculi, focal lesion, or hydronephrosis. Bladder is unremarkable. Stomach/Bowel: Stomach is within normal limits. No visible appendiceal inflammation, may be surgically absent. No evidence of bowel wall thickening, distention, or inflammatory changes. Vascular/Lymphatic: Aortic atherosclerosis. No enlarged abdominal or pelvic lymph nodes. Reproductive: Status post hysterectomy. No adnexal masses. Other: Small fat containing umbilical hernia. Musculoskeletal: No acute or significant osseous findings. IMPRESSION: No acute or worrisome findings in the abdomen and pelvis. No bowel obstruction, free fluid, or free air. Appendix not visualized, may be surgically absent. BILATERAL lower lobe opacities, greater on the LEFT, are mildly increased from CT chest several days ago. Correlate clinically for pneumonia. Aortic Atherosclerosis (ICD10-I70.0) and Emphysema (ICD10-J43.9). Electronically Signed   By: JStaci RighterM.D.   On: 07/18/2017 20:20    ASSESSMENT / PLAN:  Diffuse bilateral infiltrates, most prominent in the right upper lobe, etiology unclear, with associated mild hypoxemia.  She has been treated empirically for possible pneumonia without resolution.  Also empirically with corticosteroids, remains on prednisone 10 mg daily as an outpatient.  Agree that most straightforward next step is bronchoscopy.  Plan for at least bronchial lavage, hopefully transbronchial biopsies if she is tolerating. NPO after midnight and enoxaparin held.  I have her scheduled for 7 AM on 07/20/2017.  RBaltazar Apo MD, PhD 07/19/2017, 3:26 PM [facility] [phone] if no answer  [phone]

## 2017-07-19 NOTE — Progress Notes (Signed)
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 Admitted for sepsis secondary to CAP. Spoke with pt at bedside. Reports having a slight decrease in appetite PTA, but it has since increased back to baseline. States she recently started to make life style changes to lose weight by eating more balance meals. Does not wish to have any intervention at this time.   Body mass index is 35.04 kg/m. Patient meets criteria for obese based on current BMI.   Current diet order is heart healthy. Labs and medications reviewed.   No nutrition interventions warranted at this time. If nutrition issues arise, please consult RD.   Mariana Single RD, LDN Clinical Nutrition Pager # [phone]

## 2017-07-19 NOTE — Progress Notes (Signed)
PROGRESS NOTE    Erin Myers  QJJ:[id number] DOB: Aug 05, 1940 DOA: 07/18/2017 PCP: [name], MD   Brief Narrative: Per HPI Erin Myers is Erin Myers 77 y.o. 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  She reports waking up this morning with chills and reports having fever up to 103 F at home with rigors.  Subsequently, patient reports having nausea and vomiting with crampy/aching lower abdominal pain.  Emesis was noted to be nonbloody in appearance.  Patient reports having Coyt Govoni productive cough since October 2018 that has significantly worsened since February of this year.  She complains of sinus pressure and postnasal drip which will worsen cough symptoms. Associated symptoms include generalized malaise, wheezing, weight loss of 7 pounds, and poor appetite. Patient reports being treated with 2 rounds of antibiotics(doxycycline 3/19, and another medication) and 2 rounds of steroids without improvement of symptoms.  Currently being followed by Dr. Lenna Gilford and [facility] of pulmonology.  Patient just recently had CT scan of the chest on 5/1, showing Ketura Sirek persistent extensive mixed groundglass attenuation and consolidation throughout the right upper lobe with worsening consolidative component and widespread groundglass attenuation throughout both lobes.  Patient was scheduled to have Dyann Goodspeed bronchoscopy, but did not make appointment due to acute symptoms.  She has been currently on steroids.  Patient notes that her home nasal cannula oxygen has not been working and she is only been using her CPAP at night.  Denies any complaints of chest pain, loss of consciousness, or diarrhea.  Assessment & Plan:   Active Problems:   OSA on CPAP   Essential hypertension   Hypothyroid   Abnormal CT of the chest   Sepsis (HCC)   Sinusitis   Acute on chronic respiratory failure with  hypoxia (HCC)   Hypokalemia   Sepsis 2/2 sinusitis, possible community-acquired pneumonia: Patient reports having fever up to 103 F at home and presents with tachypnea and tachycardia.  WBC elevated at 18.9, but lactic acid was reassuring at 1.18.  Patient has had no improvement with treatment with previous medications of doxycycline.  On physical exam by initial provider, sinusitis suspected.  suspect patient has sinusitis, but possible community-acquired pneumonia (with worsened opacities on imagine) vs. urinary tract infection also on differential (suspect less likely with UA without bacteria or nitrites). Patient was initially treated with ceftriaxone and azithromycin. - Admit to [facility] telemetry bed - Follow blood and urine cx - Check respiratory virus panel (negative) - Add on procalcitonin (0.26) - Changed empiric antibiotics to vancomycin and cefepime -> will narrow to augmentin with suspicion of sinusitis and improved symptoms today - Mucinex  Acute on chronic respiratory failure with hypoxia, bronchitis: O2 saturations noted as low as 82% on admission with wheezing on physical exam.  Patient oxygenation improved on 3 L of nasal cannula oxygen.  She's on 2 L at baseline - Continuous pulse oximetry with nasal cannula oxygen as needed - Check d-dimer (negative) - DuoNeb's 4 times daily and prn - Brovana and Budesonide nebs - Solu-Medrol IV -> transition to prednisone tomorrow  Abnormal CT chest/groundglass opacities: As seen on recent CT scan of the chest with worsening consolidative appearance.  Patient was scheduled to have [facility] bronchoscopy by Dr. Malvin Johns. Discussed with Dr. Lamonte Sakai, may attempt to pursue this inpatient while she's here.  Leukocytosis: possibly 2/2 above, chronic, follow  Hypokalemia: improved, follow  Essential hypertension -  Continue Hyzaar, metoprolol   Hypothyroidism: TSH noted to be 0.49 on 05/02/2017. - Follow-up TSH (0.326), in setting of acute illness, repeat  outpatient  - Continue levothyroxine  Anxiety - Continue clorazepate  OSA on CPAP - CPAP per RT  Hyperglycemia: follow A1c, SSI with steroids  DVT prophylaxis: lovenox Code Status: full  Family Communication: none at bedside Disposition Plan: pending improvement   Consultants:   Pulmonology  Procedures:   none  Antimicrobials: Anti-infectives (From admission, onward)   Start     Dose/Rate Route Frequency Ordered Stop   07/19/17 2200  cefTRIAXone (ROCEPHIN) 2 g in sodium chloride 0.9 % 100 mL IVPB  Status:  Discontinued     2 g 200 mL/hr over 30 Minutes Intravenous Every 24 hours 07/18/17 2209 07/18/17 2219   07/19/17 2200  azithromycin (ZITHROMAX) 500 mg in sodium chloride 0.9 % 250 mL IVPB  Status:  Discontinued     500 mg 250 mL/hr over 60 Minutes Intravenous Every 24 hours 07/18/17 2209 07/18/17 2344   07/19/17 2200  vancomycin (VANCOCIN) IVPB 1000 mg/200 mL premix  Status:  Discontinued     1,000 mg 200 mL/hr over 60 Minutes Intravenous Every 24 hours 07/19/17 0449 07/19/17 0940   07/19/17 1800  amoxicillin-clavulanate (AUGMENTIN) 875-125 MG per tablet 1 tablet     1 tablet Oral Every 12 hours 07/19/17 0941     07/19/17 0000  ceFEPIme (MAXIPIME) 1 g in sodium chloride 0.9 % 100 mL IVPB  Status:  Discontinued     1 g 200 mL/hr over 30 Minutes Intravenous Every 8 hours 07/18/17 2344 07/19/17 0940   07/18/17 2345  vancomycin (VANCOCIN) 1,500 mg in sodium chloride 0.9 % 500 mL IVPB     1,500 mg 250 mL/hr over 120 Minutes Intravenous  Once 07/18/17 2344 07/19/17 0326   07/18/17 2100  cefTRIAXone (ROCEPHIN) 1 g in sodium chloride 0.9 % 100 mL IVPB  Status:  Discontinued     1 g 200 mL/hr over 30 Minutes Intravenous Every 24 hours 07/18/17 2051 07/18/17 2344   07/18/17 2045  azithromycin (ZITHROMAX) 500 mg in sodium chloride 0.9 % 250 mL IVPB     500 mg 250 mL/hr over 60 Minutes Intravenous  Once 07/18/17 2039 07/18/17 2254     Subjective: Feeling better.  Woke up  with fever and chills the other night. Denies CP or SOB.   Objective: Vitals:   07/19/17 0501 07/19/17 0929 07/19/17 0932 07/19/17 0934  BP: 122/62     Pulse: 92     Resp: 16     Temp: (!) 97.4 F (36.3 C)     TempSrc: Oral     SpO2: (!) 87% 92% 93% 95%  Weight:      Height:        Intake/Output Summary (Last 24 hours) at 07/19/2017 0941 Last data filed at 07/19/2017 0900 Gross per 24 hour  Intake 545 ml  Output -  Net 545 ml   Filed Weights   07/18/17 1950 07/18/17 2321  Weight: 94.3 kg (208 lb) 92.6 kg (204 lb 2.3 oz)    Examination:  General exam: Appears calm and comfortable  Respiratory system: Coarse breath sounds, worse on R Cardiovascular system: S1 & S2 heard, RRR. No JVD, murmurs, rubs, gallops or clicks. No pedal edema. Gastrointestinal system: Abdomen is nondistended, soft and nontender. No organomegaly or masses felt. Normal bowel sounds heard. Central nervous system: Alert and oriented. No focal neurological deficits. Extremities: Symmetric 5 x 5 power.  Skin: No rashes, lesions or ulcers Psychiatry: Judgement and insight appear normal. Mood & affect appropriate.     Data Reviewed: I have personally reviewed following labs and imaging studies  CBC: Recent Labs  Lab 07/18/17 1745 07/19/17 0616  WBC 18.9* 16.4*  NEUTROABS 15.4* 15.5*  HGB 16.4* 15.7*  HCT 50.6* 48.1*  MCV 95.7 95.1  PLT 238 478   Basic Metabolic Panel: Recent Labs  Lab 07/18/17 1650 07/19/17 0616  NA 138 137  K 3.3* 3.8  CL 99* 104  CO2 26 24  GLUCOSE 98 167*  BUN 16 14  CREATININE 0.84 0.82  CALCIUM 8.9 8.1*   GFR: Estimated Creatinine Clearance: 66.4 mL/min (by C-G formula based on SCr of 0.82 mg/dL). Liver Function Tests: Recent Labs  Lab 07/18/17 1650  AST 15  ALT 12*  ALKPHOS 48  BILITOT 1.7*  PROT 6.5  ALBUMIN 3.3*   Recent Labs  Lab 07/18/17 1650  LIPASE 22   No results for input(s): AMMONIA in the last 168 hours. Coagulation Profile: No results  for input(s): INR, PROTIME in the last 168 hours. Cardiac Enzymes: No results for input(s): CKTOTAL, CKMB, CKMBINDEX, TROPONINI in the last 168 hours. BNP (last 3 results) No results for input(s): PROBNP in the last 8760 hours. HbA1C: No results for input(s): HGBA1C in the last 72 hours. CBG: No results for input(s): GLUCAP in the last 168 hours. Lipid Profile: No results for input(s): CHOL, HDL, LDLCALC, TRIG, CHOLHDL, LDLDIRECT in the last 72 hours. Thyroid Function Tests: Recent Labs    07/18/17 1745  TSH 0.326*   Anemia Panel: No results for input(s): VITAMINB12, FOLATE, FERRITIN, TIBC, IRON, RETICCTPCT in the last 72 hours. Sepsis Labs: Recent Labs  Lab 07/18/17 1709 07/18/17 2342  PROCALCITON  --  0.26  LATICACIDVEN 1.18  --     Recent Results (from the past 240 hour(s))  Blood Culture (routine x 2)     Status: None (Preliminary result)   Collection Time: 07/18/17  4:50 PM  Result Value Ref Range Status   Specimen Description   Final    BLOOD RIGHT ANTECUBITAL Performed at [facility], [place] [address], [place], [place] [address]    Special Requests   Final    BOTTLES DRAWN AEROBIC AND ANAEROBIC Blood Culture adequate volume Performed at [facility] [address], [place], [place] [address]    Culture   Final    NO GROWTH < 12 HOURS Performed at [facility] [address], [place], [place] [address]    Report Status PENDING  Incomplete  Blood Culture (routine x 2)     Status: None (Preliminary result)   Collection Time: 07/18/17  5:46 PM  Result Value Ref Range Status   Specimen Description   Final    BLOOD LEFT HAND Performed at [facility] [address], [place], [place] [address]    Special Requests   Final    BOTTLES DRAWN AEROBIC ONLY Blood Culture adequate volume Performed at [facility] [address], [place], [place] [address]    Culture   Final    NO  GROWTH < 12 HOURS Performed at [facility] [address], [place], [place] [address]    Report Status PENDING  Incomplete  Respiratory Panel by PCR     Status: None   Collection Time: 07/18/17 11:21 PM  Result Value Ref Range Status   Adenovirus NOT DETECTED NOT DETECTED  Final   Coronavirus 229E NOT DETECTED NOT DETECTED Final   Coronavirus HKU1 NOT DETECTED NOT DETECTED Final   Coronavirus NL63 NOT DETECTED NOT DETECTED Final   Coronavirus OC43 NOT DETECTED NOT DETECTED Final   Metapneumovirus NOT DETECTED NOT DETECTED Final   Rhinovirus / Enterovirus NOT DETECTED NOT DETECTED Final   Influenza Eli[place] NOT DETECTED NOT DETECTED Final   Influenza B NOT DETECTED NOT DETECTED Final   Parainfluenza Virus 1 NOT DETECTED NOT DETECTED Final   Parainfluenza Virus 2 NOT DETECTED NOT DETECTED Final   Parainfluenza Virus 3 NOT DETECTED NOT DETECTED Final   Parainfluenza Virus 4 NOT DETECTED NOT DETECTED Final   Respiratory Syncytial Virus NOT DETECTED NOT DETECTED Final   Bordetella pertussis NOT DETECTED NOT DETECTED Final   Chlamydophila pneumoniae NOT DETECTED NOT DETECTED Final   Mycoplasma pneumoniae NOT DETECTED NOT DETECTED Final         Radiology Studies: Dg Chest 2 View  Result Date: 07/18/2017 CLINICAL DATA:  77 year old female with nausea vomiting. Fever. Prior right lung surgery for carcinoid. Cough since October 2018. EXAM: CHEST - 2 VIEW COMPARISON:  High-resolution chest CT 07/12/2017 and earlier. FINDINGS: Progressed upper and lower lung ground-glass opacity since 07/03/2017 radiographs, maximal in the right upper lobe. No consolidation. Findings are likely stable from the recent CT. No superimposed pneumothorax or pleural effusion. Visualized tracheal air column is within normal limits. Mediastinal contours remain normal. No acute osseous abnormality identified. Paucity of bowel gas in the upper abdomen. IMPRESSION: 1. Abnormal bilateral pulmonary ground-glass opacity,  maximal in the right upper lobe. No associated pleural effusion. 2. These lung findings are probably not significantly changed from the recent High-resolution Chest CT on 07/12/2017. The presence of fever would favor atypical infection, but please see that CT report regarding all differential diagnostic considerations. Electronically Signed   By: Genevie Ann M.D.   On: 07/18/2017 17:31   Ct Abdomen Pelvis W Contrast  Result Date: 07/18/2017 CLINICAL DATA:  Nausea and vomiting.  Cough and congestion. EXAM: CT ABDOMEN AND PELVIS WITH CONTRAST TECHNIQUE: Multidetector CT imaging of the abdomen and pelvis was performed using the standard protocol following bolus administration of intravenous contrast. CONTRAST:  146mL ISOVUE-300 IOPAMIDOL (ISOVUE-300) INJECTION 61% COMPARISON:  CT chest 07/12/2017. FINDINGS: Lower chest: Ground-glass attenuation throughout the lower lobes, LEFT greater than RIGHT, with consolidation, mildly increased from prior CT chest. Emphysematous changes. Hepatobiliary: No focal liver abnormality is seen. No gallstones, gallbladder wall thickening, or biliary dilatation. Pancreas: Unremarkable. No pancreatic ductal dilatation or surrounding inflammatory changes. Spleen: Normal in size without focal abnormality. Adrenals/Urinary Tract: Adrenal glands are unremarkable. Kidneys are normal, without renal calculi, focal lesion, or hydronephrosis. Bladder is unremarkable. Stomach/Bowel: Stomach is within normal limits. No visible appendiceal inflammation, may be surgically absent. No evidence of bowel wall thickening, distention, or inflammatory changes. Vascular/Lymphatic: Aortic atherosclerosis. No enlarged abdominal or pelvic lymph nodes. Reproductive: Status post hysterectomy. No adnexal masses. Other: Small fat containing umbilical hernia. Musculoskeletal: No acute or significant osseous findings. IMPRESSION: No acute or worrisome findings in the abdomen and pelvis. No bowel obstruction, free fluid,  or free air. Appendix not visualized, may be surgically absent. BILATERAL lower lobe opacities, greater on the LEFT, are mildly increased from CT chest several days ago. Correlate clinically for pneumonia. Aortic Atherosclerosis (ICD10-I70.0) and Emphysema (ICD10-J43.9). Electronically Signed   By: Staci Righter M.D.   On: 07/18/2017 20:20        Scheduled Meds: . amoxicillin-clavulanate  1 tablet Oral Q12H  .  arformoterol  15 mcg Nebulization BID  . budesonide (PULMICORT) nebulizer solution  0.5 mg Nebulization BID  . enoxaparin (LOVENOX) injection  40 mg Subcutaneous Q24H  . guaiFENesin  600 mg Oral BID  . losartan  50 mg Oral Daily   And  . hydrochlorothiazide  12.5 mg Oral Daily  . ipratropium-albuterol  3 mL Nebulization QID  . levothyroxine  112 mcg Oral QAC breakfast  . methylPREDNISolone (SOLU-MEDROL) injection  60 mg Intravenous Q8H  . metoprolol succinate  50 mg Oral BID  . rosuvastatin  10 mg Oral QPM  . sodium chloride flush  3 mL Intravenous Q12H   Continuous Infusions: . sodium chloride 75 mL/hr at 07/19/17 0300     LOS: 1 day    Time spent: over 30 min    Fayrene Helper, MD [facility] Pager [phone]  If 7PM-7AM, please contact night-coverage [email or web address] Password [facility] 07/19/2017, 9:41 AM

## 2017-07-19 NOTE — Progress Notes (Signed)
Pharmacy Antibiotic Note  Erin Myers is a 77 y.o. female admitted on 07/18/2017 with sepsis/HCAP.  Pharmacy has been consulted for Vancomycin, cefepime dosing.  Plan: Cefepime 1gm iv q8hr Vancomycin 1500mg  iv x1, then 1gm iv q24hr Goal AUC = 400 - 500 for all indications, except meningitis (goal AUC > 500 and Cmin 15-20 mcg/mL)   Height: 5\' 4"  (162.6 cm) Weight: 204 lb 2.3 oz (92.6 kg) IBW/kg (Calculated) : 54.7  Temp (24hrs), Avg:100.1 F (37.8 C), Min:98.5 F (36.9 C), Max:102.7 F (39.3 C)  Recent Labs  Lab 07/18/17 1650 07/18/17 1709 07/18/17 1745  WBC  --   --  18.9*  CREATININE 0.84  --   --   LATICACIDVEN  --  1.18  --     Estimated Creatinine Clearance: 64.8 mL/min (by C-G formula based on SCr of 0.84 mg/dL).    Allergies  Allergen Reactions  . Adhesive [Tape] Other (See Comments)    Irritates skin   . Codeine Other (See Comments)    Skin crawlinf feeling  . Lactose Intolerance (Gi) Diarrhea  . Monosodium Glutamate Diarrhea  . Sulfa Antibiotics Nausea And Vomiting    Antimicrobials this admission: Vancomycin 07/18/2017 >> Cefepime 07/18/2017 >>    Dose adjustments this admission: -  Microbiology results: -  Thank you for allowing pharmacy to be a part of this patient's care.  Erin Myers 07/19/2017 4:44 AM

## 2017-07-19 NOTE — Progress Notes (Signed)
Pt. was placed on CPAP for h/s after scheduled meds given by RN, has been tolerating well, RT to monitor.

## 2017-07-19 NOTE — Consult Note (Signed)
 Name: Erin Myers MRN: [id number] DOB: 03/02/1941    ADMISSION DATE:  07/18/2017 CONSULTATION DATE:  07/19/17  REFERRING MD : Dr. Powell  Reason for consultation: Unexplained bilateral pulmonary infiltrates  STUDIES:  CT chest 5/1 >> extensive bilateral groundglass pulmonary infiltrates most significant and with some air bronchograms and evidence for consolidation in the right upper lobe.  Also noted in the bilateral lower lobes.   HISTORY OF PRESENT ILLNESS:   77-year-old former smoker (23 pack years) whom I have met in the office.  She is been followed by Dr. Nadel and has a history of diabetes, hypertension, hyperlipidemia, hypothyroidism, sleep apnea.  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  She has been under evaluation for several months for progressive dyspnea, hypoxemia and focal right upper lobe groundglass infiltrates.  She is been treated with corticosteroids and previously with antibiotics without any resolution.  In fact her infiltrates appear to be progressive, now involving the bilateral lower lobes on CT scan from 5/1.  Her RF, ANA are negative, ESR is normal.   She is admitted now for fever, nausea, emesis.  No new clear source of infection identified. She remains on prednisone 10mg daily as an outpt.  PCCM consulted today for further evaluation of her bilateral infiltrates.   PAST MEDICAL HISTORY :   has a past medical history of Anxiety, Diabetes mellitus without complication (HCC), Dyslipidemia, HTN (hypertension), Hyperlipemia, Hypothyroid, Hypothyroidism, Lung cancer (HCC) (2000), Obesity, Ovarian cancer (HCC), Palpitations (2009 and 2006), and Sleep apnea.  has a past surgical history that includes Oophorectomy (2001); Abdominal hysterectomy (2001); Lobectomy (2000); NM MYOCAR PERF WALL MOTION (08/20/2007); Tonsillectomy; and GRADED EXERCISE TOLERANCE TEST (08/2015). Prior to Admission medications   Medication Sig Start  Date End Date Taking? Authorizing Provider  aspirin 81 MG tablet Take 81 mg by mouth daily.   Yes [provider]  budesonide-formoterol (SYMBICORT) 160-4.5 MCG/ACT inhaler Inhale 2 puffs into the lungs 2 (two) times daily. 07/03/17  Yes Nadel, Scott M, MD  clorazepate (TRANXENE) 7.5 MG tablet Take 7.5 mg by mouth 2 (two) times daily as needed for anxiety.   Yes [provider]  ipratropium-albuterol (DUONEB) 0.5-2.5 (3) MG/3ML SOLN Take 3 mLs by nebulization 3 (three) times daily. 07/06/17  Yes Nadel, Scott M, MD  levothyroxine (SYNTHROID, LEVOTHROID) 112 MCG tablet Take 112 mcg by mouth daily before breakfast.   Yes [provider]  losartan-hydrochlorothiazide (HYZAAR) 50-12.5 MG per tablet Take 1 tablet by mouth every morning.  11/11/13  Yes [provider]  metoprolol succinate (TOPROL-XL) 50 MG 24 hr tablet Take 50 mg by mouth 2 (two) times daily.    Yes [provider]  predniSONE (DELTASONE) 10 MG tablet Take 1 tablet (10 mg total) by mouth daily with breakfast. 07/03/17  Yes Nadel, Scott M, MD  rosuvastatin (CRESTOR) 10 MG tablet Take 10 mg by mouth every evening.    Yes [provider]   Allergies  Allergen Reactions  . Adhesive [Tape] Other (See Comments)    Irritates skin   . Codeine Other (See Comments)    Skin crawlinf feeling  . Lactose Intolerance (Gi) Diarrhea  . Monosodium Glutamate Diarrhea  . Sulfa Antibiotics Nausea And Vomiting    FAMILY HISTORY:  family history includes CVA in her father; CVA (age of onset: 70) in her brother; Coronary artery disease (age of onset: 50) in her mother; Heart attack in her mother; Suicidality in her sister. SOCIAL HISTORY:    reports that she quit smoking about 25 years ago. Her smoking use included cigarettes. She has a 22.50 pack-year smoking history. She has never used smokeless tobacco. She reports that she does not drink alcohol or use drugs.  REVIEW OF SYSTEMS:   Constitutional:  Positive fever, chills, negative weight loss, malaise/fatigue and diaphoresis.  HENT: Negative for hearing loss, ear pain, nosebleeds, congestion, sore throat, neck pain, tinnitus and ear discharge.   Eyes: Negative for blurred vision, double vision, photophobia, pain, discharge and redness.  Respiratory: Negative for cough, hemoptysis, sputum production, shortness of breath, wheezing and stridor.   Cardiovascular: Negative for chest pain, palpitations, orthopnea, claudication, leg swelling and PND.  Gastrointestinal: Negative for heartburn, nausea, vomiting, abdominal pain, diarrhea, constipation, blood in stool and melena.  Genitourinary: Negative for dysuria, urgency, frequency, hematuria and flank pain.  Musculoskeletal: Negative for myalgias, back pain, joint pain and falls.  Skin: Negative for itching and rash.  Neurological: Negative for dizziness, tingling, tremors, sensory change, speech change, focal weakness, seizures, loss of consciousness, weakness and headaches.  Endo/Heme/Allergies: Negative for environmental allergies and polydipsia. Does not bruise/bleed easily.  SUBJECTIVE:   VITAL SIGNS: Temp:  [97.4 F (36.3 C)-102.7 F (39.3 C)] 97.4 F (36.3 C) (05/08 0501) Pulse Rate:  [88-103] 98 (05/08 0957) Resp:  [15-26] 16 (05/08 0501) BP: (122-169)/(50-70) 154/70 (05/08 0957) SpO2:  [82 %-95 %] 91 % (05/08 1156) FiO2 (%):  [28 %] 28 % (05/08 1156) Weight:  [92.6 kg (204 lb 2.3 oz)-94.3 kg (208 lb)] 92.6 kg (204 lb 2.3 oz) (05/07 2321)  PHYSICAL EXAMINATION: General:  Pleasant obese woman, NAD Neuro: Awake, alert, nonfocal HEENT: Oropharynx clear, no evidence of thrush, pupils equal and reactive Cardiovascular: Regular, no murmur Lungs: Distant, scattered inspiratory crackles more so on the right Abdomen: Soft, obese, nontender, positive bowel sounds Musculoskeletal: No deformity Skin: No rash  Recent Labs  Lab 07/18/17 1650 07/19/17 0616  NA 138 137  K 3.3* 3.8    CL 99* 104  CO2 26 24  BUN 16 14  CREATININE 0.84 0.82  GLUCOSE 98 167*   Recent Labs  Lab 07/18/17 1745 07/19/17 0616  HGB 16.4* 15.7*  HCT 50.6* 48.1*  WBC 18.9* 16.4*  PLT 238 219   Dg Chest 2 View  Result Date: 07/18/2017 CLINICAL DATA:  76-year-old female with nausea vomiting. Fever. Prior right lung surgery for carcinoid. Cough since October 2018. EXAM: CHEST - 2 VIEW COMPARISON:  High-resolution chest CT 07/12/2017 and earlier. FINDINGS: Progressed upper and lower lung ground-glass opacity since 07/03/2017 radiographs, maximal in the right upper lobe. No consolidation. Findings are likely stable from the recent CT. No superimposed pneumothorax or pleural effusion. Visualized tracheal air column is within normal limits. Mediastinal contours remain normal. No acute osseous abnormality identified. Paucity of bowel gas in the upper abdomen. IMPRESSION: 1. Abnormal bilateral pulmonary ground-glass opacity, maximal in the right upper lobe. No associated pleural effusion. 2. These lung findings are probably not significantly changed from the recent High-resolution Chest CT on 07/12/2017. The presence of fever would favor atypical infection, but please see that CT report regarding all differential diagnostic considerations. Electronically Signed   By: H  Hall M.D.   On: 07/18/2017 17:31   Ct Abdomen Pelvis W Contrast  Result Date: 07/18/2017 CLINICAL DATA:  Nausea and vomiting.  Cough and congestion. EXAM: CT ABDOMEN AND PELVIS WITH CONTRAST TECHNIQUE: Multidetector CT imaging of the abdomen and pelvis was performed using the standard protocol following bolus administration of intravenous contrast.   CONTRAST:  100mL ISOVUE-300 IOPAMIDOL (ISOVUE-300) INJECTION 61% COMPARISON:  CT chest 07/12/2017. FINDINGS: Lower chest: Ground-glass attenuation throughout the lower lobes, LEFT greater than RIGHT, with consolidation, mildly increased from prior CT chest. Emphysematous changes. Hepatobiliary: No  focal liver abnormality is seen. No gallstones, gallbladder wall thickening, or biliary dilatation. Pancreas: Unremarkable. No pancreatic ductal dilatation or surrounding inflammatory changes. Spleen: Normal in size without focal abnormality. Adrenals/Urinary Tract: Adrenal glands are unremarkable. Kidneys are normal, without renal calculi, focal lesion, or hydronephrosis. Bladder is unremarkable. Stomach/Bowel: Stomach is within normal limits. No visible appendiceal inflammation, may be surgically absent. No evidence of bowel wall thickening, distention, or inflammatory changes. Vascular/Lymphatic: Aortic atherosclerosis. No enlarged abdominal or pelvic lymph nodes. Reproductive: Status post hysterectomy. No adnexal masses. Other: Small fat containing umbilical hernia. Musculoskeletal: No acute or significant osseous findings. IMPRESSION: No acute or worrisome findings in the abdomen and pelvis. No bowel obstruction, free fluid, or free air. Appendix not visualized, may be surgically absent. BILATERAL lower lobe opacities, greater on the LEFT, are mildly increased from CT chest several days ago. Correlate clinically for pneumonia. Aortic Atherosclerosis (ICD10-I70.0) and Emphysema (ICD10-J43.9). Electronically Signed   By: John T Curnes M.D.   On: 07/18/2017 20:20    ASSESSMENT / PLAN:  Diffuse bilateral infiltrates, most prominent in the right upper lobe, etiology unclear, with associated mild hypoxemia.  She has been treated empirically for possible pneumonia without resolution.  Also empirically with corticosteroids, remains on prednisone 10 mg daily as an outpatient.  Agree that most straightforward next step is bronchoscopy.  Plan for at least bronchial lavage, hopefully transbronchial biopsies if she is tolerating. NPO after midnight and enoxaparin held.  I have her scheduled for 7 AM on 07/20/2017.  Sereen Schaff, MD, PhD 07/19/2017, 3:26 PM [facility] [phone] or if no answer  [phone]  

## 2017-07-20 ENCOUNTER — Encounter (HOSPITAL_COMMUNITY)
Admission: EM | Disposition: A | Discharge status: Home / Self Care | Payer: Self-pay | Source: Home / Self Care | Attending: Internal Medicine

## 2017-07-20 ENCOUNTER — Inpatient Hospital Stay (HOSPITAL_COMMUNITY): Payer: PPO

## 2017-07-20 ENCOUNTER — Encounter (HOSPITAL_COMMUNITY): Payer: Self-pay | Admitting: Emergency Medicine

## 2017-07-20 HISTORY — PX: VIDEO BRONCHOSCOPY: SHX5072

## 2017-07-20 LAB — COMPREHENSIVE METABOLIC PANEL
ALBUMIN: 2.6 g/dL — AB (ref 3.5–5.0)
ALT: 11 U/L — ABNORMAL LOW (ref 14–54)
ANION GAP: 8 (ref 5–15)
AST: 15 U/L (ref 15–41)
Alkaline Phosphatase: 38 U/L (ref 38–126)
BILIRUBIN TOTAL: 0.3 mg/dL (ref 0.3–1.2)
BUN: 11 mg/dL (ref 6–20)
CO2: 25 mmol/L (ref 22–32)
Calcium: 8.4 mg/dL — ABNORMAL LOW (ref 8.9–10.3)
Chloride: 107 mmol/L (ref 101–111)
Creatinine, Ser: 0.7 mg/dL (ref 0.44–1.00)
GFR calc non Af Amer: >60 mL/min (ref >60)
GLUCOSE: 153 mg/dL — AB (ref 65–99)
POTASSIUM: 3.8 mmol/L (ref 3.5–5.1)
SODIUM: 140 mmol/L (ref 135–145)
TOTAL PROTEIN: 5.6 g/dL — AB (ref 6.5–8.1)

## 2017-07-20 LAB — BODY FLUID CELL COUNT WITH DIFFERENTIAL
EOS FL: 0 %
LYMPHS FL: 6 %
Monocyte-Macrophage-Serous Fluid: 3 % — ABNORMAL LOW (ref 50–90)
Neutrophil Count, Fluid: 91 % — ABNORMAL HIGH (ref 0–25)
WBC FLUID: 133 uL (ref 0–1000)

## 2017-07-20 LAB — CBC
HEMATOCRIT: 44.2 % (ref 36.0–46.0)
Hemoglobin: 14.5 g/dL (ref 12.0–15.0)
MCH: 31.2 pg (ref 26.0–34.0)
MCHC: 32.8 g/dL (ref 30.0–36.0)
MCV: 95.1 fL (ref 78.0–100.0)
Platelets: 245 10*3/uL (ref 150–400)
RBC: 4.65 MIL/uL (ref 3.87–5.11)
RDW: 15.3 % (ref 11.5–15.5)
WBC: 23.2 10*3/uL — ABNORMAL HIGH (ref 4.0–10.5)

## 2017-07-20 LAB — MAGNESIUM: Magnesium: 2 mg/dL (ref 1.7–2.4)

## 2017-07-20 LAB — GLUCOSE, CAPILLARY
GLUCOSE-CAPILLARY: 122 mg/dL — AB (ref 65–99)
GLUCOSE-CAPILLARY: 100 mg/dL — AB (ref 65–99)
Glucose-Capillary: 194 mg/dL — ABNORMAL HIGH (ref 65–99)
Glucose-Capillary: 102 mg/dL — ABNORMAL HIGH (ref 65–99)
Glucose-Capillary: 142 mg/dL — ABNORMAL HIGH (ref 65–99)

## 2017-07-20 LAB — URINE CULTURE

## 2017-07-20 LAB — LEGIONELLA PNEUMOPHILA SEROGP 1 UR AG: L. PNEUMOPHILA SEROGP 1 UR AG: NEGATIVE

## 2017-07-20 SURGERY — BRONCHOSCOPY, WITH FLUOROSCOPY
Anesthesia: Moderate Sedation | Laterality: Bilateral

## 2017-07-20 MED ORDER — SODIUM CHLORIDE 0.9 % IV SOLN
INTRAVENOUS | Status: DC
  Administered 2017-07-20: 07:00:00 via INTRAVENOUS

## 2017-07-20 MED ORDER — LIDOCAINE HCL 2 % EX GEL
1.0000 "application " | Freq: Once | CUTANEOUS | Status: DC
  Filled 2017-07-20: qty 5

## 2017-07-20 MED ORDER — FENTANYL CITRATE (PF) 100 MCG/2ML IJ SOLN
INTRAMUSCULAR | Status: DC | PRN
  Administered 2017-07-20: 25 ug via INTRAVENOUS
  Administered 2017-07-20: 50 ug via INTRAVENOUS
  Administered 2017-07-20 (×2): 25 ug via INTRAVENOUS

## 2017-07-20 MED ORDER — FENTANYL CITRATE (PF) 100 MCG/2ML IJ SOLN
INTRAMUSCULAR | Status: AC
  Filled 2017-07-20: qty 4

## 2017-07-20 MED ORDER — LIDOCAINE HCL (PF) 1 % IJ SOLN
INTRAMUSCULAR | Status: DC | PRN
  Administered 2017-07-20: 6 mL

## 2017-07-20 MED ORDER — LIDOCAINE HCL URETHRAL/MUCOSAL 2 % EX GEL
CUTANEOUS | Status: DC | PRN
  Administered 2017-07-20: 1

## 2017-07-20 MED ORDER — PHENYLEPHRINE HCL 0.25 % NA SOLN: 1.0000 | Freq: Four times a day (QID) | NASAL | Status: DC | PRN

## 2017-07-20 MED ORDER — PHENYLEPHRINE HCL 0.25 % NA SOLN
NASAL | Status: DC | PRN
  Administered 2017-07-20: 2 via NASAL

## 2017-07-20 MED ORDER — MIDAZOLAM HCL 10 MG/2ML IJ SOLN
INTRAMUSCULAR | Status: DC | PRN
  Administered 2017-07-20: 2 mg via INTRAVENOUS
  Administered 2017-07-20 (×4): 1 mg via INTRAVENOUS

## 2017-07-20 MED ORDER — MIDAZOLAM HCL 5 MG/ML IJ SOLN
INTRAMUSCULAR | Status: AC
  Filled 2017-07-20: qty 2

## 2017-07-20 NOTE — Progress Notes (Signed)
Pt. placed on CPAP for h/s, humidifier refilled with S/W, oxygen remains on at 2 lpm, aware to notify if needed.

## 2017-07-20 NOTE — Progress Notes (Signed)
Video bronchoscopy performed Intervention bronchial washings Intervention bronchial brushings Intervention bronchial biopsies Pt tolerated well  Simran Bomkamp David RRT  

## 2017-07-20 NOTE — Progress Notes (Signed)
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 She reports waking up this morning with chills and reports having fever up to 103 F at home with rigors. Subsequently, patient reports having nausea and vomiting with crampy/aching lower abdominal pain. Emesis was noted to be nonbloody in appearance. Patient reports having a productive cough since October 2018 that has significantly worsened since February of this year. She complains of sinus pressure and postnasal drip which will worsen cough symptoms. Associated symptoms include generalized malaise, wheezing, weight loss of 7 pounds, andpoor appetite.Patient reports being treated with 2 rounds of antibiotics (doxycycline3/19, and another medication)and 2 rounds of steroids without improvement of symptoms. Currently being followed by Dr. Lenna Gilford and [facility] pulmonology. Patient just recently had CT scan of the chest on 5/1, showing a persistent extensive mixed groundglass attenuation and consolidation throughout the right upper lobe with worsening consolidative component and widespread groundglass attenuation throughout both lobes. Patient was scheduled to have a bronchoscopy, but did not make appointment due to acute symptoms. She has been currently on steroids. Patient notes that her home nasal cannula oxygen has not been working and she is only been using her CPAP at night.   Assessment & Plan:   Principal Problem:   Abnormal CT of the chest Active Problems:   OSA on CPAP   Essential hypertension   Hypothyroid   Sepsis ([place])   Sinusitis   Acute on chronic respiratory failure with hypoxia (HCC)    Hypokalemia   Sepsis2/2sinusitis,possible community-acquired pneumonia -Patient reports having fever up to 103 F at home and presents with tachypnea and tachycardia. WBC elevated at 18.9, but lactic acid was reassuring at 1.18 -Respiratory viral panel negative  -Strep pneumo, legionella Ag negative  -Blood cultures pending  -Currently on augmentin   Acute on chronic respiratory failure with hypoxia, bronchitis -Uses 2L [place] O2 at baseline -PCCM consulted -S/p bronchoscopy due to diffuse bilateral infiltrates -Continue prednisone   Leukocytosis -Continue to monitor while on steroids  Essential hypertension -Continue Hyzaar, metoprolol  Hypothyroidism -Low TSH(0.326), in setting of acute illness, repeat outpatient  -Continue levothyroxine  Anxiety -Continueclorazepate  HLD -continue crestor   OSA on CPAP -CPAP qhs  Hyperglycemia -Ha1c 6.2, monitor blood sugars while on steroids  -SSI      DVT prophylaxis: Lovenox Code Status: Full Family Communication: No family at bedside Disposition Plan: Pending bronch result   Consultants:   PCCM   Procedures:   Bronchoscopy 5/9   Antimicrobials:  Anti-infectives (From admission, onward)   Start     Dose/Rate Route Frequency Ordered Stop   07/19/17 2200  cefTRIAXone (ROCEPHIN) 2 g in sodium chloride 0.9 % 100 mL IVPB  Status:  Discontinued     2 g 200 mL/hr over 30 Minutes Intravenous Every 24 hours 07/18/17 2209 07/18/17 2219   07/19/17 2200  azithromycin (ZITHROMAX) 500 mg in sodium chloride 0.9 % 250 mL IVPB  Status:  Discontinued     500 mg 250 mL/hr over 60 Minutes Intravenous Every 24 hours 07/18/17 2209 07/18/17 2344   07/19/17 2200  vancomycin (VANCOCIN) IVPB 1000 mg/200 mL premix  Status:  Discontinued     1,000 mg 200 mL/hr over 60 Minutes Intravenous Every 24 hours 07/19/17 0449 07/19/17 0940  07/19/17 1800  amoxicillin-clavulanate (AUGMENTIN) 875-125 MG per tablet 1 tablet     1 tablet  Oral Every 12 hours 07/19/17 0941     07/19/17 0000  ceFEPIme (MAXIPIME) 1 g in sodium chloride 0.9 % 100 mL IVPB  Status:  Discontinued     1 g 200 mL/hr over 30 Minutes Intravenous Every 8 hours 07/18/17 2344 07/19/17 0940   07/18/17 2345  vancomycin (VANCOCIN) 1,500 mg in sodium chloride 0.9 % 500 mL IVPB     1,500 mg 250 mL/hr over 120 Minutes Intravenous  Once 07/18/17 2344 07/19/17 0326   07/18/17 2100  cefTRIAXone (ROCEPHIN) 1 g in sodium chloride 0.9 % 100 mL IVPB  Status:  Discontinued     1 g 200 mL/hr over 30 Minutes Intravenous Every 24 hours 07/18/17 2051 07/18/17 2344   07/18/17 2045  azithromycin (ZITHROMAX) 500 mg in sodium chloride 0.9 % 250 mL IVPB     500 mg 250 mL/hr over 60 Minutes Intravenous  Once 07/18/17 2039 07/18/17 2254       Subjective: Doing well postprocedure.  She has no complaints of worsening shortness of breath, had some nausea postprocedure which is resolved now.  Denies any chest pain.  Objective: Vitals:   07/20/17 0815 07/20/17 0820 07/20/17 0825 07/20/17 1223  BP: (!) 120/36 (!) 116/48 (!) 116/41   Pulse:      Resp: _0 Temp:      TempSrc:      SpO2: 93% 92% 93% 95%  Weight:      Height:        Intake/Output Summary (Last 24 hours) at 07/20/2017 1247 Last data filed at 07/20/2017 0825 Gross per 24 hour  Intake 2250 ml  Output -  Net 2250 ml   Filed Weights   07/18/17 1950 07/18/17 2321  Weight: 94.3 kg (208 lb) 92.6 kg (204 lb 2.3 oz)    Examination:  General exam: Appears calm and comfortable  Respiratory system: Expiratory wheezes bilaterally. Respiratory effort normal. Cardiovascular system: S1 & S2 heard, RRR. No JVD, murmurs, rubs, gallops or clicks. No pedal edema. Gastrointestinal system: Abdomen is nondistended, soft and nontender. No organomegaly or masses felt. Normal bowel sounds heard. Central nervous system: Alert and oriented. No focal neurological deficits. Extremities: Symmetric 5 x 5 power. Skin: No  rashes, lesions or ulcers Psychiatry: Judgement and insight appear normal. Mood & affect appropriate.   Data Reviewed: I have personally reviewed following labs and imaging studies  CBC: Recent Labs  Lab 07/18/17 1745 07/19/17 0616 07/20/17 0542  WBC 18.9* 16.4* 23.2*  NEUTROABS 15.4* 15.5*  --   HGB 16.4* 15.7* 14.5  HCT 50.6* 48.1* 44.2  MCV 95.7 95.1 95.1  PLT 238 219 130   Basic Metabolic Panel: Recent Labs  Lab 07/18/17 1650 07/19/17 0616 07/20/17 0542  NA 138 137 140  K 3.3* 3.8 3.8  CL 99* 104 107  CO2 _1 GLUCOSE 98 167* 153*  BUN _2 CREATININE 0.84 0.82 0.70  CALCIUM 8.9 8.1* 8.4*  MG  --   --  2.0   GFR: Estimated Creatinine Clearance: 68.1 mL/min (by C-G formula based on SCr of 0.7 mg/dL). Liver Function Tests: Recent Labs  Lab 07/18/17 1650 07/20/17 0542  AST 15 15  ALT 12* 11*  ALKPHOS 48 38  BILITOT 1.7* 0.3  PROT 6.5 5.6*  ALBUMIN 3.3* 2.6*   Recent Labs  Lab 07/18/17 1650  LIPASE 22  No results for input(s): AMMONIA in the last 168 hours. Coagulation Profile: No results for input(s): INR, PROTIME in the last 168 hours. Cardiac Enzymes: No results for input(s): CKTOTAL, CKMB, CKMBINDEX, TROPONINI in the last 168 hours. BNP (last 3 results) No results for input(s): PROBNP in the last 8760 hours. HbA1C: Recent Labs    07/19/17 0616  HGBA1C 6.2*   CBG: Recent Labs  Lab 07/19/17 1144 07/19/17 1727 07/19/17 2213 07/20/17 0651 07/20/17 1125  GLUCAP 209* 221* 194* 122* 102*   Lipid Profile: No results for input(s): CHOL, HDL, LDLCALC, TRIG, CHOLHDL, LDLDIRECT in the last 72 hours. Thyroid Function Tests: Recent Labs    07/18/17 1745  TSH 0.326*   Anemia Panel: No results for input(s): VITAMINB12, FOLATE, FERRITIN, TIBC, IRON, RETICCTPCT in the last 72 hours. Sepsis Labs: Recent Labs  Lab 07/18/17 1709 07/18/17 2342  PROCALCITON  --  0.26  LATICACIDVEN 1.18  --     Recent Results (from the past 240  hour(s))  Blood Culture (routine x 2)     Status: None (Preliminary result)   Collection Time: 07/18/17  4:50 PM  Result Value Ref Range Status   Specimen Description   Final    BLOOD RIGHT ANTECUBITAL Performed at [facility], [place] [address] [place], [place] [address]    Special Requests   Final    BOTTLES DRAWN AEROBIC AND ANAEROBIC Blood Culture adequate volume Performed at [facility] [address] [place], [place] [address]    Culture   Final    NO GROWTH < 12 HOURS Performed at [facility] [address] [place], [place] [address]    Report Status PENDING  Incomplete  Urine culture     Status: Abnormal   Collection Time: 07/18/17  4:50 PM  Result Value Ref Range Status   Specimen Description   Final    URINE, RANDOM Performed at [place] [address], [place], [place] [address]    Special Requests   Final    NONE Performed at [facility], [employer] [address] [place], [place] [address]    Culture MULTIPLE SPECIES PRESENT, SUGGEST RECOLLECTION (A)  Final   Report Status 07/20/2017 FINAL  Final  Blood Culture (routine x 2)     Status: None (Preliminary result)   Collection Time: 07/18/17  5:46 PM  Result Value Ref Range Status   Specimen Description   Final    BLOOD LEFT HAND Performed at [facility] [address], [place], [place] [address]    Special Requests   Final    BOTTLES DRAWN AEROBIC ONLY Blood Culture adequate volume Performed at [place] [address], [place],  [address]    Culture   Final    NO GROWTH < 12 HOURS Performed at [facility] [address] [place],  [address]    Report Status PENDING  Incomplete  Respiratory Panel by PCR     Status: None   Collection Time: 07/18/17 11:21 PM  Result Value Ref Range Status   Adenovirus NOT DETECTED NOT DETECTED Final   Coronavirus 229E NOT  DETECTED NOT DETECTED Final   Coronavirus HKU1 NOT DETECTED NOT DETECTED Final   Coronavirus NL63 NOT DETECTED NOT DETECTED Final   Coronavirus OC43 NOT DETECTED NOT DETECTED Final   Metapneumovirus NOT DETECTED NOT DETECTED Final   Rhinovirus / Enterovirus NOT DETECTED NOT DETECTED Final   Influenza A  NOT DETECTED NOT DETECTED Final   Influenza B NOT DETECTED NOT DETECTED Final   Parainfluenza Virus 1 NOT DETECTED NOT DETECTED Final   Parainfluenza Virus 2 NOT DETECTED NOT DETECTED Final   Parainfluenza Virus 3 NOT DETECTED NOT DETECTED Final   Parainfluenza Virus 4 NOT DETECTED NOT DETECTED Final   Respiratory Syncytial Virus NOT DETECTED NOT DETECTED Final   Bordetella pertussis NOT DETECTED NOT DETECTED Final   Chlamydophila pneumoniae NOT DETECTED NOT DETECTED Final   Mycoplasma pneumoniae NOT DETECTED NOT DETECTED Final  Culture, bal-quantitative     Status: None (Preliminary result)   Collection Time: 07/20/17  7:45 AM  Result Value Ref Range Status   Specimen Description   Final    BRONCHIAL ALVEOLAR LAVAGE RUL Performed at [facility], [place] [address], [place], [place] [address]    Special Requests   Final    Normal Performed at [facility], [place] [address], [place], [place] [address]    Gram Stain   Final    FEW WBC PRESENT,BOTH PMN AND MONONUCLEAR NO ORGANISMS SEEN Performed at [facility], [place] [address] [place], [place] [address]    Culture PENDING  Incomplete   Report Status PENDING  Incomplete       Radiology Studies: Dg Chest 2 View  Result Date: 07/18/2017 CLINICAL DATA:  77 year old female with nausea vomiting. Fever. Prior right lung surgery for carcinoid. Cough since October 2018. EXAM: CHEST - 2 VIEW COMPARISON:  High-resolution chest CT 07/12/2017 and earlier. FINDINGS: Progressed upper and lower lung ground-glass opacity since 07/03/2017 radiographs, maximal in the right upper lobe. No consolidation. Findings  are likely stable from the recent CT. No superimposed pneumothorax or pleural effusion. Visualized tracheal air column is within normal limits. Mediastinal contours remain normal. No acute osseous abnormality identified. Paucity of bowel gas in the upper abdomen. IMPRESSION: 1. Abnormal bilateral pulmonary ground-glass opacity, maximal in the right upper lobe. No associated pleural effusion. 2. These lung findings are probably not significantly changed from the recent High-resolution Chest CT on 07/12/2017. The presence of fever would favor atypical infection, but please see that CT report regarding all differential diagnostic considerations. Electronically Signed   By: Genevie Ann M.D.   On: 07/18/2017 17:31   Ct Abdomen Pelvis W Contrast  Result Date: 07/18/2017 CLINICAL DATA:  Nausea and vomiting.  Cough and congestion. EXAM: CT ABDOMEN AND PELVIS WITH CONTRAST TECHNIQUE: Multidetector CT imaging of the abdomen and pelvis was performed using the standard protocol following bolus administration of intravenous contrast. CONTRAST:  129m ISOVUE-300 IOPAMIDOL (ISOVUE-300) INJECTION 61% COMPARISON:  CT chest 07/12/2017. FINDINGS: Lower chest: Ground-glass attenuation throughout the lower lobes, LEFT greater than RIGHT, with consolidation, mildly increased from prior CT chest. Emphysematous changes. Hepatobiliary: No focal liver abnormality is seen. No gallstones, gallbladder wall thickening, or biliary dilatation. Pancreas: Unremarkable. No pancreatic ductal dilatation or surrounding inflammatory changes. Spleen: Normal in size without focal abnormality. Adrenals/Urinary Tract: Adrenal glands are unremarkable. Kidneys are normal, without renal calculi, focal lesion, or hydronephrosis. Bladder is unremarkable. Stomach/Bowel: Stomach is within normal limits. No visible appendiceal inflammation, may be surgically absent. No evidence of bowel wall thickening, distention, or inflammatory changes. Vascular/Lymphatic: Aortic  atherosclerosis. No enlarged abdominal or pelvic lymph nodes. Reproductive: Status post hysterectomy. No adnexal masses. Other: Small fat containing umbilical hernia. Musculoskeletal: No acute or significant osseous findings. IMPRESSION: No acute or worrisome findings in the abdomen and pelvis. No bowel obstruction, free fluid, or free air. Appendix  not visualized, may be surgically absent. BILATERAL lower lobe opacities, greater on the LEFT, are mildly increased from CT chest several days ago. Correlate clinically for pneumonia. Aortic Atherosclerosis (ICD10-I70.0) and Emphysema (ICD10-J43.9). Electronically Signed   By: Staci Righter M.D.   On: 07/18/2017 20:20   Dg Chest Port 1 View  Result Date: 07/20/2017 CLINICAL DATA:  Status post bronchoscopy EXAM: PORTABLE CHEST 1 VIEW COMPARISON:  Two days ago FINDINGS: Increased airspace opacity in the right upper lobe. Low volume chest after the procedure. No evidence of air leak. Scarring or atelectasis at the right base. Normal heart size. Artifact from EKG leads. IMPRESSION: Increased right upper lobe opacity after bronchoscopy. No evidence of air leak. Electronically Signed   By: Monte Fantasia M.D.   On: 07/20/2017 08:25   Dg C-arm Bronchoscopy  Result Date: 07/20/2017 C-ARM BRONCHOSCOPY: Fluoroscopy was utilized by the requesting physician.  No radiographic interpretation.      Scheduled Meds: . amoxicillin-clavulanate  1 tablet Oral Q12H  . arformoterol  15 mcg Nebulization BID  . budesonide (PULMICORT) nebulizer solution  0.5 mg Nebulization BID  . guaiFENesin  600 mg Oral BID  . losartan  50 mg Oral Daily   And  . hydrochlorothiazide  12.5 mg Oral Daily  . insulin aspart  0-5 Units Subcutaneous QHS  . insulin aspart  0-9 Units Subcutaneous TID WC  . ipratropium-albuterol  3 mL Nebulization QID  . levothyroxine  112 mcg Oral QAC breakfast  . metoprolol succinate  50 mg Oral BID  . predniSONE  20 mg Oral Q breakfast  . rosuvastatin  10 mg  Oral QPM  . sodium chloride flush  3 mL Intravenous Q12H   Continuous Infusions: . sodium chloride 10 mL/hr at 07/20/17 0636     LOS: 2 days    Time spent: 35 minutes   Dessa Phi, DO [facility] [email or web address] Password [facility] 07/20/2017, 12:47 PM

## 2017-07-20 NOTE — Op Note (Signed)
[facility] Cardiopulmonary Patient Name: Erin Myers Procedure Date: 07/20/2017 MRN: [id number] Attending MD: Collene Gobble , MD Date of Birth: November 04, 1940 CSN: [id number] Age: 77 Admit Type: Inpatient Ethnicity: Not Hispanic or Latino Procedure:            Bronchoscopy Indications:          Unresolving bilateral infiltrates Providers:            Collene Gobble, MD, Phillis Knack RRT, RCP, Ashley Mariner                        RRT,RCP Referring MD:          Medicines:            Midazolam 6 mg IV, Fentanyl 125 mcg IV, Lidocaine 1%                        applied to cords 8 mL, Lidocaine 1% applied to the                        tracheobronchial tree 16 mL, O2 partial rebreather Complications:        No immediate complications Estimated Blood Loss: Estimated blood loss: 25 mL. Procedure:      Pre-Anesthesia Assessment:      - A History and Physical has been performed. Patient meds and allergies       have been reviewed. The risks and benefits of the procedure and the       sedation options and risks were discussed with the patient. All       questions were answered and informed consent was obtained. Patient       identification and proposed procedure were verified prior to the       procedure by the physician in the procedure room. Mental Status       Examination: normal. Airway Examination: normal oropharyngeal airway.       Respiratory Examination: rhonchi. CV Examination: normal. ASA Grade       Assessment: II - A patient with mild systemic disease. After reviewing       the risks and benefits, the patient was deemed in satisfactory condition       to undergo the procedure. The anesthesia plan was to use moderate       sedation / analgesia (conscious sedation). Immediately prior to       administration of medications, the patient was re-assessed for adequacy       to receive sedatives. The heart rate, respiratory rate, oxygen       saturations, blood pressure,  adequacy of pulmonary ventilation, and       response to care were monitored throughout the procedure. The physical       status of the patient was re-assessed after the procedure.      After obtaining informed consent, the bronchoscope was passed under       direct vision. Throughout the procedure, the patient's blood pressure,       pulse, and oxygen saturations were monitored continuously. the [id number]       ([id number]) scope was introduced through the right nostril and advanced to       the tracheobronchial tree. The procedure was accomplished without       difficulty. The patient tolerated the procedure well. The total duration       of the procedure was 31 minutes.  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 On inspiration an adequate airway inspection       could be performed.      The nasopharynx/oropharynx appears normal. The larynx appears normal       with exception of some surrounding cobblestone changes. The vocal cords       appear normal. The subglottic space is normal. The trachea is of normal       caliber. The carina is sharp. The tracheobronchial tree was examined to       at least the first subsegmental level. Bronchial mucosa and anatomy are       normal; there are no endobronchial lesions, and no secretions.      Transbronchial biopsies of an area of infiltration were performed in the       posterior segment of the right upper lobe and in the anterior segment of       the right upper lobe using forceps and sent for histopathology       examination. The procedure was guided by fluoroscopy. Biopsy of lung       tissue was obtained. Five biopsy passes were performed. Five biopsy       samples were obtained.      Fluoroscopy guided transbronchial brushings of an area of infiltration       were obtained in the apical segment of the  right upper lobe, in the       posterior segment of the right upper lobe and in the anterior segment of       the right upper lobe with a cytology brush and sent for cytology. Three       samples were obtained.      Bronchoalveolar lavage was performed in the RUL anterior segment (B3) of       the lung and sent for cell count, bacterial culture, viral smears &       culture, and fungal & AFB analysis and cytology. 60 mL of fluid were       instilled. 30 mL were returned. The return was bloody. 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      - The airway examination was normal.      - Transbronchial lung biopsies were performed.      - Transbronchial brushings were obtained.      - Bronchoalveolar lavage was performed. Moderate Sedation:      Moderate (conscious) sedation was personally administered by the       endoscopist. The following parameters were monitored: oxygen saturation,       heart rate, blood pressure, respiratory rate, EKG, adequacy of pulmonary       ventilation, and response to care. Total physician intraservice time was       31 minutes. Recommendation:      - Await BAL, biopsy, brushing, culture and cytology results. Procedure Code(s):      --- Professional ---      [phone], Bronchoscopy, rigid or flexible, including fluoroscopic guidance,       when performed; with transbronchial lung biopsy(s), single lobe      93235, Bronchoscopy, rigid or flexible, including fluoroscopic guidance,       when performed; with bronchial alveolar lavage  31623, Bronchoscopy, rigid or flexible, including fluoroscopic guidance,       when performed; with brushing or protected brushings      99152, Moderate sedation services provided by the same physician or       other qualified health care professional performing the diagnostic or       therapeutic service that the sedation supports, requiring the  presence       of an independent trained observer to assist in the monitoring of the       patient's level of consciousness and physiological status; initial 15       minutes of intraservice time, patient age 14 years or older      [phone], Moderate sedation services provided by the same physician or       other qualified health care professional performing the diagnostic or       therapeutic service that the sedation supports, requiring the presence       of an independent trained observer to assist in the monitoring of the       patient's level of consciousness and physiological status; each       additional 15 minutes intraservice time (List separately in addition to       code for primary service) Diagnosis Code(s):      --- Professional ---      R91.8, Other nonspecific abnormal finding of lung field      J98.09, Other diseases of bronchus, not elsewhere classified CPT copyright 2017 [employer]. All rights reserved. The codes documented in this report are preliminary and upon coder review may  be revised to meet current compliance requirements. Collene Gobble, MD Collene Gobble, MD 07/20/2017 8:05:27 AM Number of Addenda: 0 Scope In: 7:19:33 AM Scope Out: 7:39:36 AM

## 2017-07-20 NOTE — Interval H&P Note (Signed)
Patient presents for FOB this am. No new issues reported.  She is on more O2 this am > now 4L/min [place]. This may influence how much we can do, the duration of the procedure.  Denies cough, pain.   All questions answered. She agrees to proceed.   Baltazar Apo, MD, PhD 07/20/2017, 7:05 AM [facility] [phone] or if no answer [phone]

## 2017-07-21 ENCOUNTER — Telehealth: Payer: Self-pay | Admitting: Emergency Medicine

## 2017-07-21 ENCOUNTER — Encounter (HOSPITAL_COMMUNITY): Payer: Self-pay | Admitting: Emergency Medicine

## 2017-07-21 ENCOUNTER — Other Ambulatory Visit: Payer: Self-pay | Admitting: Emergency Medicine

## 2017-07-21 ENCOUNTER — Encounter: Payer: Self-pay | Admitting: *Deleted

## 2017-07-21 ENCOUNTER — Encounter: Payer: Self-pay | Admitting: Internal Medicine

## 2017-07-21 DIAGNOSIS — C3490 Malignant neoplasm of unspecified part of unspecified bronchus or lung: Secondary | ICD-10-CM

## 2017-07-21 LAB — CBC
HEMATOCRIT: 45.5 % (ref 36.0–46.0)
HEMOGLOBIN: 14.5 g/dL (ref 12.0–15.0)
MCH: 30.9 pg (ref 26.0–34.0)
MCHC: 31.9 g/dL (ref 30.0–36.0)
MCV: 96.8 fL (ref 78.0–100.0)
Platelets: 231 10*3/uL (ref 150–400)
RBC: 4.7 MIL/uL (ref 3.87–5.11)
RDW: 15.8 % — ABNORMAL HIGH (ref 11.5–15.5)
WBC: 12.7 10*3/uL — AB (ref 4.0–10.5)

## 2017-07-21 LAB — BASIC METABOLIC PANEL
ANION GAP: 10 (ref 5–15)
BUN: 10 mg/dL (ref 6–20)
CHLORIDE: 104 mmol/L (ref 101–111)
CO2: 28 mmol/L (ref 22–32)
Calcium: 8.4 mg/dL — ABNORMAL LOW (ref 8.9–10.3)
Creatinine, Ser: 0.64 mg/dL (ref 0.44–1.00)
GFR calc Af Amer: >60 mL/min (ref >60)
GLUCOSE: 97 mg/dL (ref 65–99)
POTASSIUM: 3.5 mmol/L (ref 3.5–5.1)
Sodium: 142 mmol/L (ref 135–145)

## 2017-07-21 LAB — GLUCOSE, CAPILLARY: Glucose-Capillary: 80 mg/dL (ref 65–99)

## 2017-07-21 LAB — ACID FAST SMEAR (AFB, MYCOBACTERIA): Acid Fast Smear: NEGATIVE

## 2017-07-21 MED ORDER — AMOXICILLIN-POT CLAVULANATE 875-125 MG PO TABS: 1.0000 | ORAL_TABLET | Freq: Two times a day (BID) | ORAL | 0 refills | Status: AC

## 2017-07-21 MED ORDER — IPRATROPIUM-ALBUTEROL 0.5-2.5 (3) MG/3ML IN SOLN: 3.0000 mL | Freq: Three times a day (TID) | RESPIRATORY_TRACT | Status: DC

## 2017-07-21 NOTE — Discharge Summary (Signed)
Physician Discharge Summary  Erin Myers SVX:[id number] DOB: [birth year] DOA: 07/18/2017  PCP: [name], MD  Admit date: 07/18/2017 Discharge date: 07/21/2017  Admitted From: Home Disposition:  Home   Recommendations for Outpatient Follow-up:  1. Follow up with PCP in 1 week 2. Follow up with Dr. Lenna Gilford, Pulmonology, as scheduled on 5/22  3. Please obtain CBC in 1 week to follow up on leukocytosis while on prednisone  4. Please follow up on the following pending results: bronchoscopy results, final blood culture results  5. 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 She reports waking up this morning with chills and reports having fever up to 103 F at home with rigors. Subsequently, patient reports having nausea and vomiting with crampy/aching lower abdominal pain. Emesis was noted to be nonbloody in appearance. Patient reports having a productive cough since October 2018 that has significantly worsened since February of this year. She complains of sinus pressure and postnasal drip which will worsen cough symptoms. Associated symptoms include generalized malaise, wheezing, weight loss of 7 pounds, andpoor appetite.Patient reports being treated with 2 rounds of antibiotics (doxycycline3/19, and another medication)and 2 rounds of steroids without improvement of symptoms. Currently being followed by Dr. Lenna Gilford and [facility] pulmonology. Patient just recently had CT scan of the chest on 5/1, showing a persistent extensive mixed groundglass attenuation and consolidation throughout the right upper lobe with worsening consolidative component and widespread  groundglass attenuation throughout both lobes. Patient was scheduled to have a bronchoscopy, but did not make appointment due to acute symptoms. She has been currently on steroids. Patient notes that her home nasal cannula oxygen has not been working and she is only been using her CPAP at night. She was evaluated by pulmonology and underwent bronchoscopy on 5/9. Her clinical status remained stable and deemed stable for discharge home. She will follow up with her pulmonologist for results of bronchoscopy.    Discharge Diagnoses:  Principal Problem:   Abnormal CT of the chest Active Problems:   OSA on CPAP   Essential hypertension   Hypothyroid   Sepsis ([place])   Sinusitis   Acute on chronic respiratory failure with hypoxia (HCC)   Hypokalemia   Sepsis2/2sinusitis,possible community-acquired pneumonia -Patient reports having fever up to 103 F at home and presents with tachypnea and tachycardia. WBC elevated at 18.9, but lactic acid was reassuring at 1.18 -Respiratory viral panel negative  -Strep pneumo, legionella Ag negative  -Blood cultures negative to date  -Currently on augmentin, discharge with augmentin to complete antibiotic course.   Acute on chronic respiratory failure with hypoxia, bronchitis -Uses 2L [place] O2 at baseline -PCCM consulted -S/p bronchoscopy due to diffuse bilateral infiltrates -Continue prednisone 15m daily   Leukocytosis -Continue to monitor while on steroids  Essential hypertension -Continue Hyzaar, metoprolol  Hypothyroidism -Low TSH(0.326), in setting of acute illness, repeat outpatient -Continue levothyroxine  Anxiety -Continueclorazepate  HLD -Continue crestor   OSA on CPAP -CPAP qhs  Hyperglycemia -Ha1c 6.2 -Due to steroid use   Discharge Instructions  Discharge Instructions    Call MD for:  difficulty breathing, headache or visual disturbances   Complete by:  As directed    Call MD for:  extreme fatigue    Complete by:  As directed    Call MD for:  hives   Complete by:  As directed    Call MD for:  persistant dizziness or light-headedness   Complete by:  As directed    Call MD for:  persistant nausea and vomiting   Complete by:  As directed    Call MD for:  severe uncontrolled pain   Complete by:  As directed    Call MD for:  temperature >100.4   Complete by:  As directed    Diet - low sodium heart healthy   Complete by:  As directed    Discharge instructions   Complete by:  As directed    You were cared for by a hospitalist during your hospital stay. If you have any questions about your discharge medications or the care you received while you were in the hospital after you are discharged, you can call the unit and ask to speak with the hospitalist on call if the hospitalist that took care of you is not available. Once you are discharged, your primary care physician will handle any further medical issues. Please note that NO REFILLS for any discharge medications will be authorized once you are discharged, as it is imperative that you return to your primary care physician (or establish a relationship with a primary care physician if you do not have one) for your aftercare needs so that they can reassess your need for medications and monitor your lab values.   Increase activity slowly   Complete by:  As directed      Allergies as of 07/21/2017      Reactions   Adhesive [tape] Other (See Comments)   Irritates skin    Codeine Other (See Comments)   Skin crawlinf feeling   Lactose Intolerance (gi) Diarrhea   Monosodium Glutamate Diarrhea   Sulfa Antibiotics Nausea And Vomiting      Medication List    TAKE these medications   amoxicillin-clavulanate 875-125 MG tablet Commonly known as:  AUGMENTIN Take 1 tablet by mouth every 12 (twelve) hours for 4 days.   aspirin 81 MG tablet Take 81 mg by mouth daily.   budesonide-formoterol 160-4.5 MCG/ACT inhaler Commonly known as:   SYMBICORT Inhale 2 puffs into the lungs 2 (two) times daily.   clorazepate 7.5 MG tablet Commonly known as:  TRANXENE Take 7.5 mg by mouth 2 (two) times daily as needed for anxiety.   ipratropium-albuterol 0.5-2.5 (3) MG/3ML Soln Commonly known as:  DUONEB Take 3 mLs by nebulization 3 (three) times daily.   levothyroxine 112 MCG tablet Commonly known as:  SYNTHROID, LEVOTHROID Take 112 mcg by mouth daily before breakfast.   losartan-hydrochlorothiazide 50-12.5 MG tablet Commonly known as:  HYZAAR Take 1 tablet by mouth every morning.   metoprolol succinate 50 MG 24 hr tablet Commonly known as:  TOPROL-XL Take 50 mg by mouth 2 (two) times daily.   predniSONE 10 MG tablet Commonly known as:  DELTASONE Take 1 tablet (10 mg total) by mouth daily with breakfast.   rosuvastatin 10 MG tablet Commonly known as:  CRESTOR Take 10 mg by mouth every evening.      Follow-up Information    [name], MD. Schedule an appointment as soon as possible for a visit in 1 week(s).   Specialty:  Internal Medicine Contact information: [place] [address] [phone]        Noralee Space, MD. Go on 08/02/2017.   Specialty:  Pulmonary Disease Contact information: [address] [place] [address] [phone]          Allergies  Allergen Reactions  . Adhesive [Tape] Other (See Comments)    Irritates skin   . Codeine Other (See Comments)    Skin crawlinf feeling  . Lactose Intolerance (Gi) Diarrhea  . Monosodium Glutamate Diarrhea  . Sulfa Antibiotics Nausea And Vomiting    Consultations:  PCCM    Procedures/Studies: Dg Chest 2 View  Result Date: 07/18/2017 CLINICAL DATA:  78 year old female with nausea vomiting. Fever. Prior right lung surgery for carcinoid. Cough since October 2018. EXAM: CHEST - 2 VIEW COMPARISON:  High-resolution chest CT 07/12/2017 and earlier. FINDINGS: Progressed upper and lower lung ground-glass opacity since  07/03/2017 radiographs, maximal in the right upper lobe. No consolidation. Findings are likely stable from the recent CT. No superimposed pneumothorax or pleural effusion. Visualized tracheal air column is within normal limits. Mediastinal contours remain normal. No acute osseous abnormality identified. Paucity of bowel gas in the upper abdomen. IMPRESSION: 1. Abnormal bilateral pulmonary ground-glass opacity, maximal in the right upper lobe. No associated pleural effusion. 2. These lung findings are probably not significantly changed from the recent High-resolution Chest CT on 07/12/2017. The presence of fever would favor atypical infection, but please see that CT report regarding all differential diagnostic considerations. Electronically Signed   By: Genevie Ann M.D.   On: 07/18/2017 17:31   Dg Chest 2 View  Result Date: 07/03/2017 CLINICAL DATA:  Wheezing and cough. Shortness of breath and chest congestion. EXAM: CHEST - 2 VIEW COMPARISON:  05/30/2017. 05/02/2017. 05/14/2015. Chest CT 05/12/2017. FINDINGS: Heart size is normal. Mediastinal shadows are normal. Left lung remains clear. Right upper lobe infiltrate is persistent, possibly minimally improved. Persistent scarring and volume loss in the right middle, with previous partial lobectomy. No worsening or new findings. No effusions. No significant bone finding. IMPRESSION: Persistent right upper lobe infiltrate. Persistent scarring and volume loss in the right middle lobe with previous partial lobectomy. No worsening or new finding. Electronically Signed   By: Nelson Chimes M.D.   On: 07/03/2017 21:01   Ct Abdomen Pelvis W Contrast  Result Date: 07/18/2017 CLINICAL DATA:  Nausea and vomiting.  Cough and congestion. EXAM: CT ABDOMEN AND PELVIS WITH CONTRAST TECHNIQUE: Multidetector CT imaging of the abdomen and pelvis was performed using the standard protocol following bolus administration of intravenous contrast. CONTRAST:  153m ISOVUE-300 IOPAMIDOL  (ISOVUE-300) INJECTION 61% COMPARISON:  CT chest 07/12/2017. FINDINGS: Lower chest: Ground-glass attenuation throughout the lower lobes, LEFT greater than RIGHT, with consolidation, mildly increased from prior CT chest. Emphysematous changes. Hepatobiliary: No focal liver abnormality is seen. No gallstones, gallbladder wall thickening, or biliary dilatation. Pancreas: Unremarkable. No pancreatic ductal dilatation or surrounding inflammatory changes. Spleen: Normal in size without focal abnormality. Adrenals/Urinary Tract: Adrenal glands are unremarkable. Kidneys are normal, without renal calculi, focal lesion, or hydronephrosis. Bladder is unremarkable. Stomach/Bowel: Stomach is within normal limits. No visible appendiceal inflammation, may be surgically absent. No evidence of bowel wall thickening, distention, or inflammatory changes. Vascular/Lymphatic: Aortic atherosclerosis. No enlarged abdominal or pelvic lymph nodes. Reproductive: Status post hysterectomy. No adnexal masses. Other: Small fat containing umbilical hernia. Musculoskeletal: No acute or significant osseous findings. IMPRESSION: No acute or worrisome findings in the abdomen and pelvis. No bowel obstruction, free fluid, or free air. Appendix not visualized, may be surgically absent. BILATERAL lower lobe opacities, greater on the LEFT, are mildly increased from CT chest several days ago. Correlate clinically for pneumonia. Aortic Atherosclerosis (ICD10-I70.0)  and Emphysema (ICD10-J43.9). Electronically Signed   By: Staci Righter M.D.   On: 07/18/2017 20:20   Ct Chest High Resolution  Result Date: 07/12/2017 CLINICAL DATA:  Chronic dyspnea. Former smoker. Reported history of right lung surgery for carcinoid in 2000. EXAM: CT CHEST WITHOUT CONTRAST TECHNIQUE: Multidetector CT imaging of the chest was performed following the standard protocol without intravenous contrast. High resolution imaging of the lungs, as well as inspiratory and expiratory  imaging, was performed. COMPARISON:  07/03/2017 chest radiograph.  05/12/2017 chest CT. FINDINGS: Cardiovascular: Normal heart size. No significant pericardial fluid/thickening. Atherosclerotic nonaneurysmal thoracic aorta. Top-normal caliber main pulmonary artery (3.0 cm diameter), stable. Mediastinum/Nodes: No discrete thyroid nodules. Unremarkable esophagus. No pathologically enlarged axillary, mediastinal or gross hilar lymph nodes, noting limited sensitivity for the detection of hilar adenopathy on this noncontrast study. Lungs/Pleura: Stable postsurgical changes in the right lung from apparent right middle lobe wedge resection. No pneumothorax. No pleural effusion. There is extensive mixed ground-glass attenuation and consolidation involving the entire dependent right upper lobe, which is persistent since 05/12/2017 chest CT with worsening of the consolidative component in the interval. There is widespread peribronchovascular ground-glass attenuation throughout the bilateral lower lobes and right middle lobe, which has slightly worsened. Persistent mild patchy consolidation in the basilar left lower lobe. No significant air trapping on the expiration sequence. There is mild tracheobronchomalacia on the expiration sequence. Mild-to-moderate centrilobular emphysema. No significant regions of traction bronchiectasis or frank honeycombing. Upper abdomen: No acute abnormality. Musculoskeletal: No aggressive appearing focal osseous lesions. Mild thoracic spondylosis. IMPRESSION: 1. Persistent extensive mixed ground-glass attenuation and consolidation throughout the dependent right upper lobe, with interval worsening of the consolidative component. Widespread peribronchovascular ground-glass attenuation throughout the remnant right middle lobe and bilateral lower lobes, slightly worsened. This process is indeterminate. At this point, the possibility of right upper lobe lung adenocarcinoma with "aerogenous" bilateral  lower lung metastases should be considered. Atypical infection is on the differential. Consider bronchoscopy and/or surgical consultation. 2. Please note that the postsurgical changes in the right lung appear to represent a right middle lobe wedge resection (the right lower lobe appears intact). 3. Mild tracheobronchomalacia. Aortic Atherosclerosis (ICD10-I70.0) and Emphysema (ICD10-J43.9). These results were called by telephone at the time of interpretation on 07/12/2017 at 1:30 pm to Dr. Teressa Lower , who verbally acknowledged these results. Electronically Signed   By: Ilona Sorrel M.D.   On: 07/12/2017 13:44   Dg Chest Port 1 View  Result Date: 07/20/2017 CLINICAL DATA:  Status post bronchoscopy EXAM: PORTABLE CHEST 1 VIEW COMPARISON:  Two days ago FINDINGS: Increased airspace opacity in the right upper lobe. Low volume chest after the procedure. No evidence of air leak. Scarring or atelectasis at the right base. Normal heart size. Artifact from EKG leads. IMPRESSION: Increased right upper lobe opacity after bronchoscopy. No evidence of air leak. Electronically Signed   By: Monte Fantasia M.D.   On: 07/20/2017 08:25   Dg C-arm Bronchoscopy  Result Date: 07/20/2017 C-ARM BRONCHOSCOPY: Fluoroscopy was utilized by the requesting physician.  No radiographic interpretation.       Discharge Exam: Vitals:   07/21/17 0516 07/21/17 0837  BP: (!) 149/55   Pulse: 61 91  Resp: 18 18  Temp: 97.9 F (36.6 C)   SpO2: 94% 91%    General: Pt is alert, awake, not in acute distress Cardiovascular: RRR, S1/S2 +, no rubs, no gallops Respiratory: Fine crackles, no wheezing, no rhonchi, no distress on [place] O2  Abdominal: Soft, NT, ND,  bowel sounds + Extremities: no edema, no cyanosis    The results of significant diagnostics from this hospitalization (including imaging, microbiology, ancillary and laboratory) are listed below for reference.     Microbiology: Recent Results (from the past 240 hour(s))   Blood Culture (routine x 2)     Status: None (Preliminary result)   Collection Time: 07/18/17  4:50 PM  Result Value Ref Range Status   Specimen Description   Final    BLOOD RIGHT ANTECUBITAL Performed at [facility], [place] [address], [place], [place] [address]    Special Requests   Final    BOTTLES DRAWN AEROBIC AND ANAEROBIC Blood Culture adequate volume Performed at [place] [address] [place], [place] [address]    Culture   Final    NO GROWTH 3 DAYS Performed at [facility], [place] [address] [place], [place] [address]    Report Status PENDING  Incomplete  Urine culture     Status: Abnormal   Collection Time: 07/18/17  4:50 PM  Result Value Ref Range Status   Specimen Description   Final    URINE, RANDOM Performed at [place] [address] [place], [place] [address]    Special Requests   Final    NONE Performed at [facility], [place] [address] [place], [place] [address]    Culture MULTIPLE SPECIES PRESENT, SUGGEST RECOLLECTION (A)  Final   Report Status 07/20/2017 FINAL  Final  Blood Culture (routine x 2)     Status: None (Preliminary result)   Collection Time: 07/18/17  5:46 PM  Result Value Ref Range Status   Specimen Description   Final    BLOOD LEFT HAND Performed at [place] [address], [place], [place] [address]    Special Requests   Final    BOTTLES DRAWN AEROBIC ONLY Blood Culture adequate volume Performed at [place] [address], [place], [place] [address]    Culture   Final    NO GROWTH 3 DAYS Performed at [facility], [place] [address] [place], [place] [address]    Report Status PENDING  Incomplete  Respiratory Panel by PCR     Status: None   Collection Time: 07/18/17 11:21 PM  Result Value Ref Range Status   Adenovirus NOT DETECTED NOT DETECTED Final   Coronavirus 229E NOT DETECTED NOT DETECTED  Final   Coronavirus HKU1 NOT DETECTED NOT DETECTED Final   Coronavirus NL63 NOT DETECTED NOT DETECTED Final   Coronavirus OC43 NOT DETECTED NOT DETECTED Final   Metapneumovirus NOT DETECTED NOT DETECTED Final   Rhinovirus / Enterovirus NOT DETECTED NOT DETECTED Final   Influenza A NOT DETECTED NOT DETECTED Final   Influenza B NOT DETECTED NOT DETECTED Final   Parainfluenza Virus 1 NOT DETECTED NOT DETECTED Final   Parainfluenza Virus 2 NOT DETECTED NOT DETECTED Final   Parainfluenza Virus 3 NOT DETECTED NOT DETECTED Final   Parainfluenza Virus 4 NOT DETECTED NOT DETECTED Final   Respiratory Syncytial Virus NOT DETECTED NOT DETECTED Final   Bordetella pertussis NOT DETECTED NOT DETECTED Final   Chlamydophila pneumoniae NOT DETECTED NOT DETECTED Final   Mycoplasma pneumoniae NOT DETECTED NOT DETECTED Final  Culture, bal-quantitative     Status: None (Preliminary result)   Collection Time: 07/20/17  7:45 AM  Result Value Ref Range Status   Specimen Description   Final    BRONCHIAL ALVEOLAR LAVAGE RUL  Performed at [facility], [place] [address], [place], [place] [address]    Special Requests   Final    Normal Performed at [facility], [place] [address], [place], [place] [address]    Gram Stain   Final    FEW WBC PRESENT,BOTH PMN AND MONONUCLEAR NO ORGANISMS SEEN    Culture   Final    NO GROWTH < 24 HOURS Performed at [facility], [place] [address] [place], [place] [address]    Report Status PENDING  Incomplete     Labs: BNP (last 3 results) No results for input(s): BNP in the last 8760 hours. Basic Metabolic Panel: Recent Labs  Lab 07/18/17 1650 07/19/17 0616 07/20/17 0542 07/21/17 0550  NA 138 137 140 142  K 3.3* 3.8 3.8 3.5  CL 99* 104 107 104  CO2 _0 GLUCOSE 98 167* 153* 97  BUN _1 CREATININE 0.84 0.82 0.70 0.64  CALCIUM 8.9 8.1* 8.4* 8.4*  MG  --   --  2.0  --    Liver Function Tests: Recent Labs   Lab 07/18/17 1650 07/20/17 0542  AST 15 15  ALT 12* 11*  ALKPHOS 48 38  BILITOT 1.7* 0.3  PROT 6.5 5.6*  ALBUMIN 3.3* 2.6*   Recent Labs  Lab 07/18/17 1650  LIPASE 22   No results for input(s): AMMONIA in the last 168 hours. CBC: Recent Labs  Lab 07/18/17 1745 07/19/17 0616 07/20/17 0542 07/21/17 0550  WBC 18.9* 16.4* 23.2* 12.7*  NEUTROABS 15.4* 15.5*  --   --   HGB 16.4* 15.7* 14.5 14.5  HCT 50.6* 48.1* 44.2 45.5  MCV 95.7 95.1 95.1 96.8  PLT 238 219 245 231   Cardiac Enzymes: No results for input(s): CKTOTAL, CKMB, CKMBINDEX, TROPONINI in the last 168 hours. BNP: Invalid input(s): POCBNP CBG: Recent Labs  Lab 07/20/17 0651 07/20/17 1125 07/20/17 1628 07/20/17 2150 07/21/17 0734  GLUCAP 122* 102* 142* 100* 80   D-Dimer Recent Labs    07/18/17 1745  DDIMER <0.27   Hgb A1c Recent Labs    07/19/17 0616  HGBA1C 6.2*   Lipid Profile No results for input(s): CHOL, HDL, LDLCALC, TRIG, CHOLHDL, LDLDIRECT in the last 72 hours. Thyroid function studies Recent Labs    07/18/17 1745  TSH 0.326*   Anemia work up No results for input(s): VITAMINB12, FOLATE, FERRITIN, TIBC, IRON, RETICCTPCT in the last 72 hours. Urinalysis    Component Value Date/Time   COLORURINE YELLOW 07/18/2017 1650   APPEARANCEUR CLEAR 07/18/2017 1650   LABSPEC 1.005 07/18/2017 1650   PHURINE 7.0 07/18/2017 1650   GLUCOSEU NEGATIVE 07/18/2017 1650   HGBUR NEGATIVE 07/18/2017 1650   BILIRUBINUR NEGATIVE 07/18/2017 1650   KETONESUR NEGATIVE 07/18/2017 1650   PROTEINUR NEGATIVE 07/18/2017 1650   NITRITE NEGATIVE 07/18/2017 1650   LEUKOCYTESUR LARGE (A) 07/18/2017 1650   Sepsis Labs Invalid input(s): PROCALCITONIN,  WBC,  LACTICIDVEN Microbiology Recent Results (from the past 240 hour(s))  Blood Culture (routine x 2)     Status: None (Preliminary result)   Collection Time: 07/18/17  4:50 PM  Result Value Ref Range Status   Specimen Description   Final    BLOOD RIGHT  ANTECUBITAL Performed at [facility], [place] [address] [place], [place] [address]    Special Requests   Final    BOTTLES DRAWN AEROBIC AND ANAEROBIC Blood Culture adequate volume Performed at [place]  [address] [place], [place] [address]    Culture   Final    NO GROWTH 3 DAYS Performed at [facility], [place] [address], [place], [place] [address]    Report Status PENDING  Incomplete  Urine culture     Status: Abnormal   Collection Time: 07/18/17  4:50 PM  Result Value Ref Range Status   Specimen Description   Final    URINE, RANDOM Performed at [place] [address], [place], [place] [address]    Special Requests   Final    NONE Performed at [facility], [place] [address] [place], [place] [address]    Culture MULTIPLE SPECIES PRESENT, SUGGEST RECOLLECTION (A)  Final   Report Status 07/20/2017 FINAL  Final  Blood Culture (routine x 2)     Status: None (Preliminary result)   Collection Time: 07/18/17  5:46 PM  Result Value Ref Range Status   Specimen Description   Final    BLOOD LEFT HAND Performed at [place] [address] [place], [place] [address]    Special Requests   Final    BOTTLES DRAWN AEROBIC ONLY Blood Culture adequate volume Performed at [place] [address] [place], [place] [address]    Culture   Final    NO GROWTH 3 DAYS Performed at [facility], [place] [address], [place], [place] [address]    Report Status PENDING  Incomplete  Respiratory Panel by PCR     Status: None   Collection Time: 07/18/17 11:21 PM  Result Value Ref Range Status   Adenovirus NOT DETECTED NOT DETECTED Final   Coronavirus 229E NOT DETECTED NOT DETECTED Final   Coronavirus HKU1 NOT DETECTED NOT DETECTED Final   Coronavirus NL63 NOT DETECTED NOT DETECTED Final   Coronavirus OC43 NOT DETECTED NOT DETECTED Final   Metapneumovirus NOT  DETECTED NOT DETECTED Final   Rhinovirus / Enterovirus NOT DETECTED NOT DETECTED Final   Influenza A NOT DETECTED NOT DETECTED Final   Influenza B NOT DETECTED NOT DETECTED Final   Parainfluenza Virus 1 NOT DETECTED NOT DETECTED Final   Parainfluenza Virus 2 NOT DETECTED NOT DETECTED Final   Parainfluenza Virus 3 NOT DETECTED NOT DETECTED Final   Parainfluenza Virus 4 NOT DETECTED NOT DETECTED Final   Respiratory Syncytial Virus NOT DETECTED NOT DETECTED Final   Bordetella pertussis NOT DETECTED NOT DETECTED Final   Chlamydophila pneumoniae NOT DETECTED NOT DETECTED Final   Mycoplasma pneumoniae NOT DETECTED NOT DETECTED Final  Culture, bal-quantitative     Status: None (Preliminary result)   Collection Time: 07/20/17  7:45 AM  Result Value Ref Range Status   Specimen Description   Final    BRONCHIAL ALVEOLAR LAVAGE RUL Performed at [facility], [place] [address] [place], [place] [address]    Special Requests   Final    Normal Performed at [facility], [place] [address] [name], [place] [address]    Gram Stain   Final    FEW WBC PRESENT,BOTH PMN AND MONONUCLEAR NO ORGANISMS SEEN    Culture   Final    NO GROWTH < 24 HOURS Performed at [facility], [place] [address] [place], [place] [address]    Report Status PENDING  Incomplete     Patient was seen and examined on the day of discharge and was found to be in stable condition. Time coordinating discharge: 35 minutes including assessment and coordination  of care, as well as examination of the patient.   SIGNED:  Dessa Phi, DO [facility] Pager [phone]  If 7PM-7AM, please contact night-coverage [email or web address] Password [facility] 07/21/2017, 10:47 AM

## 2017-07-21 NOTE — Progress Notes (Signed)
I reviewed path results with the pt and her husband at bedside.   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 Referral order placed.   Baltazar Apo, MD, PhD 07/21/2017, 12:00 PM [facility] [phone] or if no answer [phone]

## 2017-07-21 NOTE — Progress Notes (Signed)
Name: Erin Myers MRN: [id number] DOB: 08-May-1940    ADMISSION DATE:  07/18/2017 CONSULTATION DATE:  07/19/17  REFERRING MD : Dr. Florene Glen  Reason for consultation: Unexplained bilateral pulmonary infiltrates  STUDIES:  CT chest 5/1 >> extensive bilateral groundglass pulmonary infiltrates most significant and with some air bronchograms and evidence for consolidation in the right upper lobe.  Also noted in the bilateral lower lobes.   HISTORY OF PRESENT ILLNESS:   77 year old former smoker (23 pack years) whom I have met in the office.  She is been followed by Dr. Lenna Gilford and has a history of diabetes, hypertension, hyperlipidemia, hypothyroidism, sleep apnea.  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  She has been under evaluation for several months for progressive dyspnea, hypoxemia and focal right upper lobe groundglass infiltrates.  She is been treated with corticosteroids and previously with antibiotics without any resolution.  In fact her infiltrates appear to be progressive, now involving the bilateral lower lobes on CT scan from 5/1.  Her RF, ANA are negative, ESR is normal.   She is admitted now for fever, nausea, emesis.  No new clear source of infection identified. She remains on prednisone 87m daily as an outpt.  PCCM consulted today for further evaluation of her bilateral infiltrates.   SUBJECTIVE / Interval Events:  Status post bronchoscopy on 5/9, transbronchial biopsies, BAL, brushings performed  VITAL SIGNS: Temp:  [97.9 F (36.6 C)-98.4 F (36.9 C)] 97.9 F (36.6 C) (05/10 0516) Pulse Rate:  [61-91] 91 (05/10 0837) Resp:  [16-18] 18 (05/10 0837) BP: (137-149)/(55-66) 149/55 (05/10 0516) SpO2:  [91 %-95 %] 91 % (05/10 0837)  PHYSICAL EXAMINATION: General: Pleasant obese woman, no distress, currently wearing nasal cannula oxygen Neuro: Awake, alert, nonfocal, good strength HEENT: Oropharynx moist, no thrush, pupils  equal Cardiovascular: Regular, no murmur Lungs: Distant, scattered inspiratory crackles more so on the right than on the left Abdomen: Soft, benign Musculoskeletal: No deformities Skin: No rash  Recent Labs  Lab 07/19/17 0616 07/20/17 0542 07/21/17 0550  NA 137 140 142  K 3.8 3.8 3.5  CL 104 107 104  CO2 _0 BUN _1 CREATININE 0.82 0.70 0.64  GLUCOSE 167* 153* 97   Recent Labs  Lab 07/19/17 0616 07/20/17 0542 07/21/17 0550  HGB 15.7* 14.5 14.5  HCT 48.1* 44.2 45.5  WBC 16.4* 23.2* 12.7*  PLT 219 245 231    Transbronchial biopsies right upper lobe 5/9 >>  Transbronchial brushings right upper lobe 5/9 >>  BAL right upper lobe cytology 5/9 >>  BAL cell count 5/9 >> WBC 133, 91% neutrophils, 6% lymphocyte, 3% monocyte Right upper lobe BAL AFB 5/9 >>  Right upper lobe BAL fungus 5/9 >>  Right upper lobe BAL bacterial culture 5/9 >>    Dg Chest Port 1 View  Result Date: 07/20/2017 CLINICAL DATA:  Status post bronchoscopy EXAM: PORTABLE CHEST 1 VIEW COMPARISON:  Two days ago FINDINGS: Increased airspace opacity in the right upper lobe. Low volume chest after the procedure. No evidence of air leak. Scarring or atelectasis at the right base. Normal heart size. Artifact from EKG leads. IMPRESSION: Increased right upper lobe opacity after bronchoscopy. No evidence of air leak. Electronically Signed   By: JMonte FantasiaM.D.   On: 07/20/2017 08:25   Dg C-arm Bronchoscopy  Result Date: 07/20/2017 C-ARM BRONCHOSCOPY: Fluoroscopy was utilized by the requesting physician.  No radiographic interpretation.    ASSESSMENT /  PLAN:  Diffuse bilateral infiltrates, most prominent in the right upper lobe, etiology unclear, with associated mild hypoxemia.  She has been treated empirically for possible pneumonia without resolution.  Also empirically with corticosteroids, remains on prednisone 10 mg daily as an outpatient.  Bronchoscopy performed and cytology, pathology, culture  data all pending.  From my perspective she can be discharged home with plan to follow-up her bronchoscopy results with either myself or Dr. Lenna Gilford.  I would recommend weaning her prednisone back down to 10 mg daily which was her preadmission dose.  The decision to come off completely can be discussed with Dr Lenna Gilford.  Unclear whether this is an active infection but it may be reasonable to send her home to complete a course of antibiotics given her fever on presentation.  She has an appointment with Dr. Lenna Gilford on 08/02/2017.   Baltazar Apo, MD, PhD 07/21/2017, 9:30 AM [facility] [phone] or if no answer [phone]

## 2017-07-21 NOTE — Progress Notes (Signed)
Oncology Nurse Navigator Documentation  Oncology Nurse Navigator Flowsheets 07/21/2017  Navigator Location [facility]  Referral date to RadOnc/MedOnc 07/21/2017  Navigator Encounter Type Other/I received referral on Ms. Chock today. I updated new patient coordinator to call and schedule patient to be seen with Dr. [name].  I will also put her on thoracic cancer conference for discussion.   Treatment Phase Pre-Tx/Tx Discussion  Barriers/Navigation Needs Coordination of Care  Interventions Coordination of Care  Coordination of Care Other  Acuity Level 2  Time Spent with Patient 30

## 2017-07-21 NOTE — Progress Notes (Signed)
Patient discharged to home w/ family. Given all belongings and instructions. Patient and husband both verbalized understanding of instructions. Escorted to pov via w/c.

## 2017-07-22 LAB — CULTURE, BAL-QUANTITATIVE: SPECIAL REQUESTS: NORMAL

## 2017-07-22 LAB — CULTURE, BAL-QUANTITATIVE W GRAM STAIN: Culture: NO GROWTH

## 2017-07-23 LAB — CULTURE, BLOOD (ROUTINE X 2)
CULTURE: NO GROWTH
Culture: NO GROWTH
SPECIAL REQUESTS: ADEQUATE
SPECIAL REQUESTS: ADEQUATE

## 2017-07-27 ENCOUNTER — Other Ambulatory Visit: Payer: Self-pay

## 2017-07-27 NOTE — Patient Outreach (Signed)
[facility]) Care Management  07/27/2017  Sarayah Bacchi 26-Nov-1940 [id number]     EMMI-General Discharge RED ON EMMI ALERT Day #  4 Date: 07/26/17 Red Alert Reason: " Sad/hopeless/anxious/empty? Yes"   Outreach attempt #1 to patient. No answer at present. RN CM left HIPAA compliant voicemail message along with contact info.         Plan: RN CM will make outreach attempt to patient within 3-4 business days.   Enzo Montgomery, RN,BSN,CCM [facility] Telephonic Care Management Coordinator Direct Phone: [phone] Toll Free: [phone] Fax: [phone]

## 2017-07-28 ENCOUNTER — Other Ambulatory Visit: Payer: Self-pay

## 2017-07-28 NOTE — Patient Outreach (Signed)
[facility]) Care Management  07/28/2017  Erin Myers 15-Jun-1940 [id number]     EMMI-General Discharge RED ON EMMI ALERT Day #  4 Date: 07/26/17 Red Alert Reason: " Sad/hopeless/anxious/empty? Yes"    Voicemail message received from patient. Return call placed to patient. She voices she is doing fairly well. RN CM reviewed and addressed red alert with patient. She voices that she is only experiencing some feelings of being anxious. She shares that sh ws recently diagnosed with lung CA. She has an appt with oncologist on Monday to discuss plan and options. She voices that she has very supportive spouse in the home. Patient reports she has had some SOB with exertion, not worse than normal. She continues to have occasional cough of clear phlegm. RN CM reviewed with patient s/s of worsening condition and when to seek medical attention. Patient voiced understanding. She denies any issues regarding her meds. She has PCP appt on 08/10/17 and sees Dr. Lenna Gilford on 08/02/17. Spouse will be taking her to appts. Patient voices no THN needs or concerns at this time. Advised patient that RN CM has sent out brochure and info via mail for patient to have for future reference in case she has any needs in the future. Patient voiced understanding and was appreciative of call. She has completed automated EMMI-General post discharge calls at this time.        Plan: RN CM will close case as no further interventions needed.   Enzo Montgomery, RN,BSN,CCM [facility] Telephonic Care Management Coordinator Direct Phone: [phone] Toll Free: [phone] Fax: [phone]

## 2017-07-31 ENCOUNTER — Inpatient Hospital Stay: Payer: PPO

## 2017-07-31 ENCOUNTER — Telehealth: Payer: Self-pay | Admitting: Internal Medicine

## 2017-07-31 ENCOUNTER — Encounter: Payer: Self-pay | Admitting: Internal Medicine

## 2017-07-31 ENCOUNTER — Other Ambulatory Visit: Payer: Self-pay | Admitting: Medical Oncology

## 2017-07-31 ENCOUNTER — Inpatient Hospital Stay: Payer: PPO | Attending: Internal Medicine | Admitting: Internal Medicine

## 2017-07-31 VITALS — BP 146/52 | HR 74 | Temp 98.1°F | Resp 18 | Ht 64.0 in | Wt 207.2 lb

## 2017-07-31 DIAGNOSIS — F419 Anxiety disorder, unspecified: Secondary | ICD-10-CM | POA: Diagnosis not present

## 2017-07-31 DIAGNOSIS — Z859 Personal history of malignant neoplasm, unspecified: Secondary | ICD-10-CM

## 2017-07-31 DIAGNOSIS — Z79899 Other long term (current) drug therapy: Secondary | ICD-10-CM | POA: Diagnosis not present

## 2017-07-31 DIAGNOSIS — E039 Hypothyroidism, unspecified: Secondary | ICD-10-CM | POA: Insufficient documentation

## 2017-07-31 DIAGNOSIS — C3490 Malignant neoplasm of unspecified part of unspecified bronchus or lung: Secondary | ICD-10-CM

## 2017-07-31 DIAGNOSIS — I10 Essential (primary) hypertension: Secondary | ICD-10-CM | POA: Insufficient documentation

## 2017-07-31 DIAGNOSIS — E785 Hyperlipidemia, unspecified: Secondary | ICD-10-CM | POA: Diagnosis not present

## 2017-07-31 DIAGNOSIS — Z8543 Personal history of malignant neoplasm of ovary: Secondary | ICD-10-CM | POA: Diagnosis not present

## 2017-07-31 DIAGNOSIS — E669 Obesity, unspecified: Secondary | ICD-10-CM | POA: Diagnosis not present

## 2017-07-31 DIAGNOSIS — C3411 Malignant neoplasm of upper lobe, right bronchus or lung: Secondary | ICD-10-CM | POA: Insufficient documentation

## 2017-07-31 DIAGNOSIS — Z7982 Long term (current) use of aspirin: Secondary | ICD-10-CM

## 2017-07-31 DIAGNOSIS — R630 Anorexia: Secondary | ICD-10-CM | POA: Diagnosis not present

## 2017-07-31 DIAGNOSIS — Z87891 Personal history of nicotine dependence: Secondary | ICD-10-CM | POA: Diagnosis not present

## 2017-07-31 DIAGNOSIS — C7802 Secondary malignant neoplasm of left lung: Secondary | ICD-10-CM | POA: Diagnosis not present

## 2017-07-31 DIAGNOSIS — I700 Atherosclerosis of aorta: Secondary | ICD-10-CM | POA: Diagnosis not present

## 2017-07-31 DIAGNOSIS — Z7189 Other specified counseling: Secondary | ICD-10-CM | POA: Insufficient documentation

## 2017-07-31 DIAGNOSIS — C3491 Malignant neoplasm of unspecified part of right bronchus or lung: Secondary | ICD-10-CM | POA: Insufficient documentation

## 2017-07-31 DIAGNOSIS — G4730 Sleep apnea, unspecified: Secondary | ICD-10-CM | POA: Diagnosis not present

## 2017-07-31 DIAGNOSIS — E119 Type 2 diabetes mellitus without complications: Secondary | ICD-10-CM | POA: Diagnosis not present

## 2017-07-31 LAB — CBC WITH DIFFERENTIAL (CANCER CENTER ONLY)
BASOS PCT: 1 %
Basophils Absolute: 0.1 10*3/uL (ref 0.0–0.1)
Eosinophils Absolute: 0.1 10*3/uL (ref 0.0–0.5)
Eosinophils Relative: 1 %
HEMATOCRIT: 51.1 % — AB (ref 34.8–46.6)
HEMOGLOBIN: 16.3 g/dL — AB (ref 11.6–15.9)
Lymphocytes Relative: 8 %
Lymphs Abs: 1.5 10*3/uL (ref 0.9–3.3)
MCH: 29.8 pg (ref 25.1–34.0)
MCHC: 31.9 g/dL (ref 31.5–36.0)
MCV: 93.3 fL (ref 79.5–101.0)
MONOS PCT: 4 %
Monocytes Absolute: 0.8 10*3/uL (ref 0.1–0.9)
NEUTROS PCT: 86 %
Neutro Abs: 16.6 10*3/uL — ABNORMAL HIGH (ref 1.5–6.5)
Platelet Count: 277 10*3/uL (ref 145–400)
RBC: 5.47 MIL/uL — ABNORMAL HIGH (ref 3.70–5.45)
RDW: 16.2 % — ABNORMAL HIGH (ref 11.2–14.5)
WBC Count: 19.2 10*3/uL — ABNORMAL HIGH (ref 3.9–10.3)

## 2017-07-31 LAB — CMP (CANCER CENTER ONLY)
ALK PHOS: 52 U/L (ref 40–150)
ALT: 17 U/L (ref 0–55)
AST: 14 U/L (ref 5–34)
Albumin: 3.5 g/dL (ref 3.5–5.0)
Anion gap: 9 (ref 3–11)
BUN: 13 mg/dL (ref 7–26)
CALCIUM: 9.7 mg/dL (ref 8.4–10.4)
CO2: 33 mmol/L — AB (ref 22–29)
CREATININE: 0.79 mg/dL (ref 0.60–1.10)
Chloride: 100 mmol/L (ref 98–109)
Glucose, Bld: 101 mg/dL (ref 70–140)
Potassium: 3.7 mmol/L (ref 3.5–5.1)
SODIUM: 142 mmol/L (ref 136–145)
Total Bilirubin: 0.9 mg/dL (ref 0.2–1.2)
Total Protein: 6.8 g/dL (ref 6.4–8.3)

## 2017-07-31 NOTE — Telephone Encounter (Signed)
Appointments scheduled AVS/Calendar printed per 5/20 los °

## 2017-07-31 NOTE — Progress Notes (Signed)
[place] [phone]   [phone]) [phone]  CONSULT NOTE  REFERRING PHYSICIAN: Dr. Baltazar Apo  REASON FOR CONSULTATION:  77 years old white female recently diagnosed with lung cancer.  HPI Erin Myers is a 77 y.o. 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 Arlyce Dice as well as questionable history of ovarian cancer.  The patient also has history of his smoking but quit in 1993.  She mentioned that she has been complaining of cough for more than a year.  Over the last 3 months her cough and shortness of breath has been getting worse.  She was seen by her primary care physician and had chest x-ray performed on 05/02/2017 and that showed suspicious right upper lobe pneumonia.  This was followed by CT scan of the chest on May 12, 2017 and that showed airspace lung opacity most evident in the right upper lobe, where is combination of confluent opacity and adjacent groundglass opacity.  There was also small areas of ill-defined peribronchovascular groundglass opacity noted in the lower lobes and the finding suspicious for multifocal pneumonia.  She was treated for pneumonia with no improvement of her condition.  She had high-resolution CT scan of the chest on Jul 12, 2017 and that showed persistent extensive mixed groundglass attenuation and consolidation throughout the dependent right upper lobe with interval worsening of the consolidative component.  There was widespread peribronchovascular groundglass attenuation throughout the remnant right middle lobe and bilateral lower lobes slightly worsened.  The possibility of right upper lobe lung adenocarcinoma with aerogeneous bilateral lower lung metastasis should be considered. The patient was seen by Dr. Lamonte Sakai and on 07/20/2017 she  underwent bronchoscopy with transbronchial biopsy of the right upper lobe.  The final pathology ([id number]) was consistent with well-differentiated adenocarcinoma.  There was insufficient material for molecular studies. Dr. Lamonte Sakai kindly referred the patient to me today for further evaluation and recommendation regarding treatment of her condition. When seen today the patient is feeling fine with no specific complaints except for shortness of breath and dry cough.  She denied having any chest pain or hemoptysis.  She lost around 7 pounds in the last 6 months secondary to lack of appetite.  She denied having any nausea, vomiting, diarrhea or constipation.  She denied having any headache or visual changes. Family history significant for mother with heart disease and father with questionable stroke. The patient is married and has 2 children.  She was accompanied today by her husband Erin Myers.  She used to work as a Catering manager.  She has a history of smoking less than 1 pack/day for around 30 years and quit in 1993.  She has no history of alcohol or drug abuse.  HPI  Past Medical History:  Diagnosis Date  . Anxiety   . Diabetes mellitus without complication ([place])   . Dyslipidemia   . HTN (hypertension)   . Hyperlipemia   . Hypothyroid   . Hypothyroidism   . Lung cancer ([place]) 2000   Carcinoid, s/p RLL lobectomy  . Obesity   . Ovarian cancer ([place])   . Palpitations 2009 and 2006   Negative nuclear stress test   . Sleep apnea    on C-pap    Past Surgical History:  Procedure Laterality Date  . ABDOMINAL HYSTERECTOMY  2001  . GRADED EXERCISE TOLERANCE  TEST  08/2015    Blood pressure demonstrated a hypertensive response to exercise.  There was less than 0.90mm of horizontal ST segment depression in the inferolateral leads. No diagnostic criterial for ischemia. Moderately impaired exercise tolerance. The patient achieved 5.8 mets. This is a low risk study.  . LOBECTOMY  2000   RLL  . NM  MYOCAR PERF WALL MOTION  08/20/2007   EF 74%  EXERCISE CAPACITY 7 METS  . OOPHORECTOMY  2001  . TONSILLECTOMY    . VIDEO BRONCHOSCOPY Bilateral 07/20/2017   Procedure: VIDEO BRONCHOSCOPY WITH FLUORO;  Surgeon: Collene Gobble, MD;  Location: [facility];  Service: Cardiopulmonary;  Laterality: Bilateral;    Family History  Problem Relation Age of Onset  . Coronary artery disease Mother 43       died of an MI  . Heart attack Mother   . Suicidality Sister   . CVA Brother 63  . CVA Father     Social History Social History   Tobacco Use  . Smoking status: Former Smoker    Packs/day: 0.75    Years: 30.00    Pack years: 22.50    Types: Cigarettes    Last attempt to quit: 08/18/1991    Years since quitting: 25.9  . Smokeless tobacco: Never Used  Substance Use Topics  . Alcohol use: No    Alcohol/week: 0.0 oz  . Drug use: No    Allergies  Allergen Reactions  . Adhesive [Tape] Other (See Comments)    Irritates skin   . Codeine Other (See Comments)    Skin crawlinf feeling  . Lactose Intolerance (Gi) Diarrhea  . Monosodium Glutamate Diarrhea  . Sulfa Antibiotics Nausea And Vomiting    Current Outpatient Medications  Medication Sig Dispense Refill  . budesonide-formoterol (SYMBICORT) 160-4.5 MCG/ACT inhaler Inhale 2 puffs into the lungs 2 (two) times daily. 1 Inhaler 6  . clorazepate (TRANXENE) 7.5 MG tablet Take 7.5 mg by mouth 2 (two) times daily as needed for anxiety.    [name] ipratropium-albuterol (DUONEB) 0.5-2.5 (3) MG/3ML SOLN Take 3 mLs by nebulization 3 (three) times daily. 360 mL 2  . levothyroxine (SYNTHROID, LEVOTHROID) 112 MCG tablet Take 112 mcg by mouth daily before breakfast.    . losartan-hydrochlorothiazide (HYZAAR) 50-12.5 MG per tablet Take 1 tablet by mouth every morning.     . metoprolol succinate (TOPROL-XL) 50 MG 24 hr tablet Take 50 mg by mouth 2 (two) times daily.     . predniSONE (DELTASONE) 10 MG tablet Take 1 tablet (10 mg total) by mouth daily with  breakfast. 30 tablet 0  . rosuvastatin (CRESTOR) 10 MG tablet Take 10 mg by mouth every evening.     [name] aspirin 81 MG tablet Take 81 mg by mouth daily.    [name] ibuprofen (ADVIL,MOTRIN) 200 MG tablet Take 200 mg by mouth every 6 (six) hours as needed.     No current facility-administered medications for this visit.     Review of Systems  Constitutional: positive for anorexia and weight loss Eyes: negative Ears, nose, mouth, throat, and face: negative Respiratory: positive for cough and dyspnea on exertion Cardiovascular: negative Gastrointestinal: negative Genitourinary:negative Integument/breast: negative Hematologic/lymphatic: negative Musculoskeletal:negative Neurological: negative Behavioral/Psych: negative Endocrine: negative Allergic/Immunologic: negative  Physical Exam  MWN:UUVOZ, healthy, no distress, well nourished, well developed and anxious SKIN: skin color, texture, turgor are normal, no rashes or significant lesions HEAD: Normocephalic, No masses, lesions, tenderness or abnormalities EYES: normal, PERRLA, Conjunctiva are pink and non-injected EARS: External  ears normal, Canals clear OROPHARYNX:no exudate, no erythema and lips, buccal mucosa, and tongue normal  NECK: supple, no adenopathy, no JVD LYMPH:  no palpable lymphadenopathy, no hepatosplenomegaly BREAST:not examined LUNGS: scattered rales bilaterally, scattered rhonchi and rales bilaterally.  No consolidation to percussion. HEART: regular rate & rhythm, no murmurs and no gallops ABDOMEN:abdomen soft, non-tender, normal bowel sounds and no masses or organomegaly BACK: Back symmetric, no curvature., No CVA tenderness EXTREMITIES:no joint deformities, effusion, or inflammation, no edema  NEURO: alert & oriented x 3 with fluent speech, no focal motor/sensory deficits  PERFORMANCE STATUS: ECOG 1  LABORATORY DATA: Lab Results  Component Value Date   WBC 19.2 (H) 07/31/2017   HGB 16.3 (H) 07/31/2017   HCT  51.1 (H) 07/31/2017   MCV 93.3 07/31/2017   PLT 277 07/31/2017      Chemistry      Component Value Date/Time   NA 142 07/21/2017 0550   K 3.5 07/21/2017 0550   CL 104 07/21/2017 0550   CO2 28 07/21/2017 0550   BUN 10 07/21/2017 0550   CREATININE 0.64 07/21/2017 0550      Component Value Date/Time   CALCIUM 8.4 (L) 07/21/2017 0550   ALKPHOS 38 07/20/2017 0542   AST 15 07/20/2017 0542   ALT 11 (L) 07/20/2017 0542   BILITOT 0.3 07/20/2017 0542       RADIOGRAPHIC STUDIES: Dg Chest 2 View  Result Date: 07/18/2017 CLINICAL DATA:  78 year old female with nausea vomiting. Fever. Prior right lung surgery for carcinoid. Cough since October 2018. EXAM: CHEST - 2 VIEW COMPARISON:  High-resolution chest CT 07/12/2017 and earlier. FINDINGS: Progressed upper and lower lung ground-glass opacity since 07/03/2017 radiographs, maximal in the right upper lobe. No consolidation. Findings are likely stable from the recent CT. No superimposed pneumothorax or pleural effusion. Visualized tracheal air column is within normal limits. Mediastinal contours remain normal. No acute osseous abnormality identified. Paucity of bowel gas in the upper abdomen. IMPRESSION: 1. Abnormal bilateral pulmonary ground-glass opacity, maximal in the right upper lobe. No associated pleural effusion. 2. These lung findings are probably not significantly changed from the recent High-resolution Chest CT on 07/12/2017. The presence of fever would favor atypical infection, but please see that CT report regarding all differential diagnostic considerations. Electronically Signed   By: Genevie Ann M.D.   On: 07/18/2017 17:31   Dg Chest 2 View  Result Date: 07/03/2017 CLINICAL DATA:  Wheezing and cough. Shortness of breath and chest congestion. EXAM: CHEST - 2 VIEW COMPARISON:  05/30/2017. 05/02/2017. 05/14/2015. Chest CT 05/12/2017. FINDINGS: Heart size is normal. Mediastinal shadows are normal. Left lung remains clear. Right upper lobe  infiltrate is persistent, possibly minimally improved. Persistent scarring and volume loss in the right middle, with previous partial lobectomy. No worsening or new findings. No effusions. No significant bone finding. IMPRESSION: Persistent right upper lobe infiltrate. Persistent scarring and volume loss in the right middle lobe with previous partial lobectomy. No worsening or new finding. Electronically Signed   By: Nelson Chimes M.D.   On: 07/03/2017 21:01   Ct Abdomen Pelvis W Contrast  Result Date: 07/18/2017 CLINICAL DATA:  Nausea and vomiting.  Cough and congestion. EXAM: CT ABDOMEN AND PELVIS WITH CONTRAST TECHNIQUE: Multidetector CT imaging of the abdomen and pelvis was performed using the standard protocol following bolus administration of intravenous contrast. CONTRAST:  141mL ISOVUE-300 IOPAMIDOL (ISOVUE-300) INJECTION 61% COMPARISON:  CT chest 07/12/2017. FINDINGS: Lower chest: Ground-glass attenuation throughout the lower lobes, LEFT greater than RIGHT, with consolidation,  mildly increased from prior CT chest. Emphysematous changes. Hepatobiliary: No focal liver abnormality is seen. No gallstones, gallbladder wall thickening, or biliary dilatation. Pancreas: Unremarkable. No pancreatic ductal dilatation or surrounding inflammatory changes. Spleen: Normal in size without focal abnormality. Adrenals/Urinary Tract: Adrenal glands are unremarkable. Kidneys are normal, without renal calculi, focal lesion, or hydronephrosis. Bladder is unremarkable. Stomach/Bowel: Stomach is within normal limits. No visible appendiceal inflammation, may be surgically absent. No evidence of bowel wall thickening, distention, or inflammatory changes. Vascular/Lymphatic: Aortic atherosclerosis. No enlarged abdominal or pelvic lymph nodes. Reproductive: Status post hysterectomy. No adnexal masses. Other: Small fat containing umbilical hernia. Musculoskeletal: No acute or significant osseous findings. IMPRESSION: No acute or  worrisome findings in the abdomen and pelvis. No bowel obstruction, free fluid, or free air. Appendix not visualized, may be surgically absent. BILATERAL lower lobe opacities, greater on the LEFT, are mildly increased from CT chest several days ago. Correlate clinically for pneumonia. Aortic Atherosclerosis (ICD10-I70.0) and Emphysema (ICD10-J43.9). Electronically Signed   By: Staci Righter M.D.   On: 07/18/2017 20:20   Ct Chest High Resolution  Result Date: 07/12/2017 CLINICAL DATA:  Chronic dyspnea. Former smoker. Reported history of right lung surgery for carcinoid in 2000. EXAM: CT CHEST WITHOUT CONTRAST TECHNIQUE: Multidetector CT imaging of the chest was performed following the standard protocol without intravenous contrast. High resolution imaging of the lungs, as well as inspiratory and expiratory imaging, was performed. COMPARISON:  07/03/2017 chest radiograph.  05/12/2017 chest CT. FINDINGS: Cardiovascular: Normal heart size. No significant pericardial fluid/thickening. Atherosclerotic nonaneurysmal thoracic aorta. Top-normal caliber main pulmonary artery (3.0 cm diameter), stable. Mediastinum/Nodes: No discrete thyroid nodules. Unremarkable esophagus. No pathologically enlarged axillary, mediastinal or gross hilar lymph nodes, noting limited sensitivity for the detection of hilar adenopathy on this noncontrast study. Lungs/Pleura: Stable postsurgical changes in the right lung from apparent right middle lobe wedge resection. No pneumothorax. No pleural effusion. There is extensive mixed ground-glass attenuation and consolidation involving the entire dependent right upper lobe, which is persistent since 05/12/2017 chest CT with worsening of the consolidative component in the interval. There is widespread peribronchovascular ground-glass attenuation throughout the bilateral lower lobes and right middle lobe, which has slightly worsened. Persistent mild patchy consolidation in the basilar left lower lobe.  No significant air trapping on the expiration sequence. There is mild tracheobronchomalacia on the expiration sequence. Mild-to-moderate centrilobular emphysema. No significant regions of traction bronchiectasis or frank honeycombing. Upper abdomen: No acute abnormality. Musculoskeletal: No aggressive appearing focal osseous lesions. Mild thoracic spondylosis. IMPRESSION: 1. Persistent extensive mixed ground-glass attenuation and consolidation throughout the dependent right upper lobe, with interval worsening of the consolidative component. Widespread peribronchovascular ground-glass attenuation throughout the remnant right middle lobe and bilateral lower lobes, slightly worsened. This process is indeterminate. At this point, the possibility of right upper lobe lung adenocarcinoma with "aerogenous" bilateral lower lung metastases should be considered. Atypical infection is on the differential. Consider bronchoscopy and/or surgical consultation. 2. Please note that the postsurgical changes in the right lung appear to represent a right middle lobe wedge resection (the right lower lobe appears intact). 3. Mild tracheobronchomalacia. Aortic Atherosclerosis (ICD10-I70.0) and Emphysema (ICD10-J43.9). These results were called by telephone at the time of interpretation on 07/12/2017 at 1:30 pm to Dr. Teressa Lower , who verbally acknowledged these results. Electronically Signed   By: Ilona Sorrel M.D.   On: 07/12/2017 13:44   Dg Chest Port 1 View  Result Date: 07/20/2017 CLINICAL DATA:  Status post bronchoscopy EXAM: PORTABLE CHEST 1 VIEW COMPARISON:  Two days ago FINDINGS: Increased airspace opacity in the right upper lobe. Low volume chest after the procedure. No evidence of air leak. Scarring or atelectasis at the right base. Normal heart size. Artifact from EKG leads. IMPRESSION: Increased right upper lobe opacity after bronchoscopy. No evidence of air leak. Electronically Signed   By: Monte Fantasia M.D.   On:  07/20/2017 08:25   Dg C-arm Bronchoscopy  Result Date: 07/20/2017 C-ARM BRONCHOSCOPY: Fluoroscopy was utilized by the requesting physician.  No radiographic interpretation.    ASSESSMENT: This is a very pleasant 77 years old white female with highly suspicious stage IV (T3, N0, M1 a) non-small cell lung cancer, well-differentiated adenocarcinoma presented with multifocal disease involving mainly the right upper lobe as well as the right middle lobe and left lower lobe of the lung diagnosed in May 2019   PLAN: I had a lengthy discussion with the patient and her husband today about her current disease stage, prognosis and treatment options. Personally and independently reviewed the scan images and discussed the results and showed the images to the patient and her husband. Her disease presenting with lymphangitic spread through the right upper lobe as well as right middle lobe and left lower lobe. Unfortunately there was insufficient material for molecular studies. I recommended for the patient to have a PET scan as well as MRI of the brain to complete the staging work-up of her disease. I will also arrange for the patient to have blood test for molecular studies by [facility]. I will see the patient back for follow-up visit in 2 weeks for reevaluation and more detailed discussion of her treatment options based on the staging work-up on molecular studies. If the blood test is negative for actionable mutations, she may benefit from repeat bronchoscopy and biopsy to obtain more tissue for molecular studies. For the shortness of breath, the patient will continue on her current treatment with nebulizer as well as prednisone 10 mg p.o. daily.  She is followed by Dr. Lamonte Sakai and Dr. Lenna Gilford. For the hypertension and diabetes mellitus she is followed by Dr. Sharlett Iles. She was advised to call immediately if she has any concerning symptoms in the interval.  The patient voices understanding of current disease  status and treatment options and is in agreement with the current care plan.  All questions were answered. The patient knows to call the clinic with any problems, questions or concerns. We can certainly see the patient much sooner if necessary.  Thank you so much for allowing me to participate in the care of Anjolina Byrer. I will continue to follow up the patient with you and assist in her care.  I spent 55 minutes counseling the patient face to face. The total time spent in the appointment was 80 minutes.  Disclaimer: This note was dictated with voice recognition software. Similar sounding words can inadvertently be transcribed and may not be corrected upon review.   Eilleen Kempf Jul 31, 2017, 2:31 PM

## 2017-08-02 ENCOUNTER — Ambulatory Visit: Payer: PPO | Admitting: Pulmonary Disease

## 2017-08-06 DIAGNOSIS — J449 Chronic obstructive pulmonary disease, unspecified: Secondary | ICD-10-CM | POA: Diagnosis not present

## 2017-08-08 ENCOUNTER — Telehealth: Payer: Self-pay | Admitting: Pulmonary Disease

## 2017-08-08 ENCOUNTER — Ambulatory Visit: Payer: PPO | Admitting: Emergency Medicine

## 2017-08-08 MED ORDER — PREDNISONE 10 MG PO TABS: 10.0000 mg | ORAL_TABLET | Freq: Every day | ORAL | 0 refills | Status: DC

## 2017-08-08 NOTE — Telephone Encounter (Signed)
Refill Prednisone 10mg  and change direction to 1 tab one and 1/2 tab the next. Prescription called to [facility].  Patient notified.  Nothing further needed at this time.

## 2017-08-08 NOTE — Telephone Encounter (Signed)
Patient called and stated that she has been on Prednisone 10mg  daily since Feb. Patient wants to know if she is supposed to continue or is the plan to reduce dose?  Patient stated she will need a refill sent either way.  SN please advise.  Allergies  Allergen Reactions  . Adhesive [Tape] Other (See Comments)    Irritates skin   . Codeine Other (See Comments)    Skin crawlinf feeling  . Lactose Intolerance (Gi) Diarrhea  . Monosodium Glutamate Diarrhea  . Sulfa Antibiotics Nausea And Vomiting   Current Outpatient Medications on File Prior to Visit  Medication Sig Dispense Refill  . aspirin 81 MG tablet Take 81 mg by mouth daily.    . budesonide-formoterol (SYMBICORT) 160-4.5 MCG/ACT inhaler Inhale 2 puffs into the lungs 2 (two) times daily. 1 Inhaler 6  . clorazepate (TRANXENE) 7.5 MG tablet Take 7.5 mg by mouth 2 (two) times daily as needed for anxiety.    [name] ibuprofen (ADVIL,MOTRIN) 200 MG tablet Take 200 mg by mouth every 6 (six) hours as needed.    [name] ipratropium-albuterol (DUONEB) 0.5-2.5 (3) MG/3ML SOLN Take 3 mLs by nebulization 3 (three) times daily. 360 mL 2  . levothyroxine (SYNTHROID, LEVOTHROID) 112 MCG tablet Take 112 mcg by mouth daily before breakfast.    . losartan-hydrochlorothiazide (HYZAAR) 50-12.5 MG per tablet Take 1 tablet by mouth every morning.     . metoprolol succinate (TOPROL-XL) 50 MG 24 hr tablet Take 50 mg by mouth 2 (two) times daily.     . predniSONE (DELTASONE) 10 MG tablet Take 1 tablet (10 mg total) by mouth daily with breakfast. 30 tablet 0  . rosuvastatin (CRESTOR) 10 MG tablet Take 10 mg by mouth every evening.      No current facility-administered medications on file prior to visit.

## 2017-08-09 ENCOUNTER — Ambulatory Visit (HOSPITAL_COMMUNITY)
Admission: RE | Admit: 2017-08-09 | Discharge: 2017-08-09 | Disposition: A | Discharge status: Home / Self Care | Payer: PPO | Source: Ambulatory Visit | Attending: Internal Medicine | Admitting: Internal Medicine

## 2017-08-09 ENCOUNTER — Encounter (HOSPITAL_COMMUNITY)
Admission: RE | Admit: 2017-08-09 | Discharge: 2017-08-09 | Disposition: A | Discharge status: Home / Self Care | Payer: PPO | Source: Ambulatory Visit | Attending: Internal Medicine | Admitting: Internal Medicine

## 2017-08-09 ENCOUNTER — Encounter (HOSPITAL_COMMUNITY): Payer: Self-pay | Admitting: Radiology

## 2017-08-09 DIAGNOSIS — R918 Other nonspecific abnormal finding of lung field: Secondary | ICD-10-CM | POA: Diagnosis not present

## 2017-08-09 DIAGNOSIS — R9089 Other abnormal findings on diagnostic imaging of central nervous system: Secondary | ICD-10-CM | POA: Diagnosis not present

## 2017-08-09 DIAGNOSIS — C3490 Malignant neoplasm of unspecified part of unspecified bronchus or lung: Secondary | ICD-10-CM

## 2017-08-09 DIAGNOSIS — Z79899 Other long term (current) drug therapy: Secondary | ICD-10-CM | POA: Diagnosis not present

## 2017-08-09 DIAGNOSIS — I700 Atherosclerosis of aorta: Secondary | ICD-10-CM | POA: Diagnosis not present

## 2017-08-09 DIAGNOSIS — C3492 Malignant neoplasm of unspecified part of left bronchus or lung: Secondary | ICD-10-CM | POA: Diagnosis not present

## 2017-08-09 LAB — GLUCOSE, CAPILLARY: GLUCOSE-CAPILLARY: 117 mg/dL — AB (ref 65–99)

## 2017-08-09 MED ORDER — FLUDEOXYGLUCOSE F - 18 (FDG) INJECTION
10.5100 | Freq: Once | INTRAVENOUS | Status: AC | PRN
  Administered 2017-08-09: 10.51 via INTRAVENOUS

## 2017-08-09 MED ORDER — GADOBENATE DIMEGLUMINE 529 MG/ML IV SOLN
20.0000 mL | Freq: Once | INTRAVENOUS | Status: AC | PRN
  Administered 2017-08-09: 20 mL via INTRAVENOUS

## 2017-08-10 ENCOUNTER — Encounter: Payer: Self-pay | Admitting: Oncology

## 2017-08-10 ENCOUNTER — Telehealth: Payer: Self-pay | Admitting: Oncology

## 2017-08-10 ENCOUNTER — Inpatient Hospital Stay (HOSPITAL_BASED_OUTPATIENT_CLINIC_OR_DEPARTMENT_OTHER): Payer: PPO | Admitting: Oncology

## 2017-08-10 ENCOUNTER — Other Ambulatory Visit: Payer: Self-pay

## 2017-08-10 ENCOUNTER — Other Ambulatory Visit: Payer: Self-pay | Admitting: Internal Medicine

## 2017-08-10 VITALS — BP 145/43 | HR 95 | Temp 98.9°F | Resp 18 | Ht 64.0 in | Wt 206.4 lb

## 2017-08-10 DIAGNOSIS — Z8543 Personal history of malignant neoplasm of ovary: Secondary | ICD-10-CM | POA: Diagnosis not present

## 2017-08-10 DIAGNOSIS — C3411 Malignant neoplasm of upper lobe, right bronchus or lung: Secondary | ICD-10-CM | POA: Diagnosis not present

## 2017-08-10 DIAGNOSIS — C3491 Malignant neoplasm of unspecified part of right bronchus or lung: Secondary | ICD-10-CM

## 2017-08-10 DIAGNOSIS — C7802 Secondary malignant neoplasm of left lung: Secondary | ICD-10-CM | POA: Diagnosis not present

## 2017-08-10 DIAGNOSIS — I10 Essential (primary) hypertension: Secondary | ICD-10-CM

## 2017-08-10 DIAGNOSIS — G4733 Obstructive sleep apnea (adult) (pediatric): Secondary | ICD-10-CM | POA: Diagnosis not present

## 2017-08-10 DIAGNOSIS — E669 Obesity, unspecified: Secondary | ICD-10-CM

## 2017-08-10 DIAGNOSIS — E119 Type 2 diabetes mellitus without complications: Secondary | ICD-10-CM

## 2017-08-10 DIAGNOSIS — E039 Hypothyroidism, unspecified: Secondary | ICD-10-CM

## 2017-08-10 DIAGNOSIS — E1151 Type 2 diabetes mellitus with diabetic peripheral angiopathy without gangrene: Secondary | ICD-10-CM | POA: Diagnosis not present

## 2017-08-10 DIAGNOSIS — Z7982 Long term (current) use of aspirin: Secondary | ICD-10-CM

## 2017-08-10 DIAGNOSIS — G4730 Sleep apnea, unspecified: Secondary | ICD-10-CM | POA: Diagnosis not present

## 2017-08-10 DIAGNOSIS — E785 Hyperlipidemia, unspecified: Secondary | ICD-10-CM | POA: Diagnosis not present

## 2017-08-10 DIAGNOSIS — C3431 Malignant neoplasm of lower lobe, right bronchus or lung: Secondary | ICD-10-CM | POA: Diagnosis not present

## 2017-08-10 DIAGNOSIS — Z87891 Personal history of nicotine dependence: Secondary | ICD-10-CM

## 2017-08-10 DIAGNOSIS — Z6835 Body mass index (BMI) 35.0-35.9, adult: Secondary | ICD-10-CM | POA: Diagnosis not present

## 2017-08-10 DIAGNOSIS — I700 Atherosclerosis of aorta: Secondary | ICD-10-CM

## 2017-08-10 DIAGNOSIS — Z5112 Encounter for antineoplastic immunotherapy: Secondary | ICD-10-CM | POA: Insufficient documentation

## 2017-08-10 DIAGNOSIS — Z5111 Encounter for antineoplastic chemotherapy: Secondary | ICD-10-CM | POA: Insufficient documentation

## 2017-08-10 DIAGNOSIS — F419 Anxiety disorder, unspecified: Secondary | ICD-10-CM | POA: Diagnosis not present

## 2017-08-10 DIAGNOSIS — E038 Other specified hypothyroidism: Secondary | ICD-10-CM | POA: Diagnosis not present

## 2017-08-10 DIAGNOSIS — Z7189 Other specified counseling: Secondary | ICD-10-CM

## 2017-08-10 DIAGNOSIS — F418 Other specified anxiety disorders: Secondary | ICD-10-CM | POA: Diagnosis not present

## 2017-08-10 DIAGNOSIS — R0602 Shortness of breath: Secondary | ICD-10-CM | POA: Diagnosis not present

## 2017-08-10 DIAGNOSIS — R630 Anorexia: Secondary | ICD-10-CM | POA: Diagnosis not present

## 2017-08-10 DIAGNOSIS — Z79899 Other long term (current) drug therapy: Secondary | ICD-10-CM

## 2017-08-10 MED ORDER — FOLIC ACID 1 MG PO TABS: 1.0000 mg | ORAL_TABLET | Freq: Every day | ORAL | 2 refills | Status: DC

## 2017-08-10 MED ORDER — CYANOCOBALAMIN 1000 MCG/ML IJ SOLN
1000.0000 ug | Freq: Once | INTRAMUSCULAR | Status: AC
  Administered 2017-08-10: 1000 ug via INTRAMUSCULAR

## 2017-08-10 MED ORDER — PROCHLORPERAZINE MALEATE 10 MG PO TABS: 10.0000 mg | ORAL_TABLET | Freq: Four times a day (QID) | ORAL | 0 refills | Status: DC | PRN

## 2017-08-10 MED ORDER — LIDOCAINE-PRILOCAINE 2.5-2.5 % EX CREA: 1.0000 "application " | TOPICAL_CREAM | CUTANEOUS | 0 refills | Status: DC | PRN

## 2017-08-10 MED ORDER — CYANOCOBALAMIN 1000 MCG/ML IJ SOLN
INTRAMUSCULAR | Status: AC
  Filled 2017-08-10: qty 1

## 2017-08-10 NOTE — Progress Notes (Signed)
[facility] OFFICE PROGRESS NOTE  [name], MD [place] [address]  DIAGNOSIS: highly suspicious stage IV (T3, N0, M1 a) non-small cell lung cancer, well-differentiated adenocarcinoma presented with multifocal disease involving mainly the right upper lobe as well as the right middle lobe and left lower lobe of the lung diagnosed in May 2019.  Guardant 360 testing was negative for actionable mutations.  PRIOR THERAPY: None  CURRENT THERAPY: Carboplatin for an AUC of 5, Alimta 500 mg meter squared, and Keytruda 200 mg IV given every 3 weeks.  First dose expected on 08/17/2017.  INTERVAL HISTORY: Arleigh Dicola 77 y.o. female returns for routine follow-up visit accompanied by her husband.  The patient is feeling fine today and has no specific complaints.  The patient denies fevers and chills.  Denies chest pain and hemoptysis.  She does report dyspnea on exertion which is unchanged.  She also has an intermittent nonproductive cough.  Denies nausea, vomiting, constipation, diarrhea.  Denies recent weight loss or night sweats.  She does report a decreased appetite and would like to see the dietitian.  The patient is here for discussion of her MRI of the brain and PET scan results.  MEDICAL HISTORY: Past Medical History:  Diagnosis Date  . Anxiety   . Diabetes mellitus without complication ([place])   . Dyslipidemia   . HTN (hypertension)   . Hyperlipemia   . Hypothyroid   . Hypothyroidism   . Lung cancer ([place]) 2000   Carcinoid, s/p RLL lobectomy  . Obesity   . Ovarian cancer ([place])   . Palpitations 2009 and 2006   Negative nuclear stress test   . Sleep apnea    on C-pap    ALLERGIES:  is allergic to adhesive [tape]; codeine; lactose intolerance (gi); monosodium glutamate; and sulfa antibiotics.  MEDICATIONS:  Current Outpatient Medications  Medication Sig Dispense Refill  . benzonatate (TESSALON) 100 MG capsule Take by mouth 3 (three) times  daily as needed for cough.    . budesonide-formoterol (SYMBICORT) 160-4.5 MCG/ACT inhaler Inhale 2 puffs into the lungs 2 (two) times daily. 1 Inhaler 6  . clorazepate (TRANXENE) 7.5 MG tablet Take 7.5 mg by mouth 2 (two) times daily as needed for anxiety.    [name] ibuprofen (ADVIL,MOTRIN) 200 MG tablet Take 200 mg by mouth every 6 (six) hours as needed.    [name] ipratropium-albuterol (DUONEB) 0.5-2.5 (3) MG/3ML SOLN Take 3 mLs by nebulization 3 (three) times daily. 360 mL 2  . levothyroxine (SYNTHROID, LEVOTHROID) 112 MCG tablet Take 112 mcg by mouth daily before breakfast.    . losartan-hydrochlorothiazide (HYZAAR) 50-12.5 MG per tablet Take 1 tablet by mouth every morning.     . metoprolol succinate (TOPROL-XL) 50 MG 24 hr tablet Take 50 mg by mouth 2 (two) times daily.     . predniSONE (DELTASONE) 10 MG tablet Take 1 tablet (10 mg total) by mouth daily with breakfast. Alternate 1 tab one day and 1/2 tab the next 30 tablet 0  . rosuvastatin (CRESTOR) 10 MG tablet Take 10 mg by mouth every evening.     [name] aspirin 81 MG tablet Take 81 mg by mouth daily.    . folic acid (FOLVITE) 1 MG tablet Take 1 tablet (1 mg total) by mouth daily. 30 tablet 2  . lidocaine-prilocaine (EMLA) cream Apply 1 application topically as needed. 30 g 0  . prochlorperazine (COMPAZINE) 10 MG tablet Take 1 tablet (10 mg total) by mouth every 6 (six)  hours as needed for nausea or vomiting. 30 tablet 0   No current facility-administered medications for this visit.     SURGICAL HISTORY:  Past Surgical History:  Procedure Laterality Date  . ABDOMINAL HYSTERECTOMY  2001  . GRADED EXERCISE TOLERANCE TEST  08/2015    Blood pressure demonstrated a hypertensive response to exercise.  There was less than 0.30mm of horizontal ST segment depression in the inferolateral leads. No diagnostic criterial for ischemia. Moderately impaired exercise tolerance. The patient achieved 5.8 mets. This is a low risk study.  . LOBECTOMY  2000   RLL  . NM  MYOCAR PERF WALL MOTION  08/20/2007   EF 74%  EXERCISE CAPACITY 7 METS  . OOPHORECTOMY  2001  . TONSILLECTOMY    . VIDEO BRONCHOSCOPY Bilateral 07/20/2017   Procedure: VIDEO BRONCHOSCOPY WITH FLUORO;  Surgeon: Collene Gobble, MD;  Location: [facility];  Service: Cardiopulmonary;  Laterality: Bilateral;    REVIEW OF SYSTEMS:   Review of Systems  Constitutional: Negative for chills, fatigue, fever and unexpected weight change. Positive for decreased appetite. HENT:   Negative for mouth sores, nosebleeds, sore throat and trouble swallowing.   Eyes: Negative for eye problems and icterus.  Respiratory: Negative for hemoptysis and wheezing.  Positive for intermittent cough and shortness of breath with exertion.  Cardiovascular: Negative for chest pain and leg swelling.  Gastrointestinal: Negative for abdominal pain, constipation, diarrhea, nausea and vomiting.  Genitourinary: Negative for bladder incontinence, difficulty urinating, dysuria, frequency and hematuria.   Musculoskeletal: Negative for back pain, gait problem, neck pain and neck stiffness.  Skin: Negative for itching and rash.  Neurological: Negative for dizziness, extremity weakness, gait problem, headaches, light-headedness and seizures.  Hematological: Negative for adenopathy. Does not bruise/bleed easily.  Psychiatric/Behavioral: Negative for confusion, depression and sleep disturbance. The patient is not nervous/anxious.     PHYSICAL EXAMINATION:  Blood pressure (!) 145/43, pulse 95, temperature 98.9 F (37.2 C), temperature source Oral, resp. rate 18, height 5\' 4"  (1.626 m), weight 206 lb 6.4 oz (93.6 kg), SpO2 94 %.  ECOG PERFORMANCE STATUS: 1 - Symptomatic but completely ambulatory  Physical Exam  Constitutional: Oriented to person, place, and time and well-developed, well-nourished, and in no distress. No distress.  HENT:  Head: Normocephalic and atraumatic.  Mouth/Throat: Oropharynx is clear and moist. No  oropharyngeal exudate.  Eyes: Conjunctivae are normal. Right eye exhibits no discharge. Left eye exhibits no discharge. No scleral icterus.  Neck: Normal range of motion. Neck supple.  Cardiovascular: Normal rate, regular rhythm, normal heart sounds and intact distal pulses.   Pulmonary/Chest: Effort normal and breath sounds normal. No respiratory distress. No wheezes. No rales.  Abdominal: Soft. Bowel sounds are normal. Exhibits no distension and no mass. There is no tenderness.  Musculoskeletal: Normal range of motion. Exhibits no edema.  Lymphadenopathy:    No cervical adenopathy.  Neurological: Alert and oriented to person, place, and time. Exhibits normal muscle tone. Gait normal. Coordination normal.  Skin: Skin is warm and dry. No rash noted. Not diaphoretic. No erythema. No pallor.  Psychiatric: Mood, memory and judgment normal.  Vitals reviewed.  LABORATORY DATA: Lab Results  Component Value Date   WBC 19.2 (H) 07/31/2017   HGB 16.3 (H) 07/31/2017   HCT 51.1 (H) 07/31/2017   MCV 93.3 07/31/2017   PLT 277 07/31/2017      Chemistry      Component Value Date/Time   NA 142 07/31/2017 1352   K 3.7 07/31/2017 1352   CL  100 07/31/2017 1352   CO2 33 (H) 07/31/2017 1352   BUN 13 07/31/2017 1352   CREATININE 0.79 07/31/2017 1352      Component Value Date/Time   CALCIUM 9.7 07/31/2017 1352   ALKPHOS 52 07/31/2017 1352   AST 14 07/31/2017 1352   ALT 17 07/31/2017 1352   BILITOT 0.9 07/31/2017 1352       RADIOGRAPHIC STUDIES:  Dg Chest 2 View  Result Date: 07/18/2017 CLINICAL DATA:  77 year old female with nausea vomiting. Fever. Prior right lung surgery for carcinoid. Cough since October 2018. EXAM: CHEST - 2 VIEW COMPARISON:  High-resolution chest CT 07/12/2017 and earlier. FINDINGS: Progressed upper and lower lung ground-glass opacity since 07/03/2017 radiographs, maximal in the right upper lobe. No consolidation. Findings are likely stable from the recent CT. No  superimposed pneumothorax or pleural effusion. Visualized tracheal air column is within normal limits. Mediastinal contours remain normal. No acute osseous abnormality identified. Paucity of bowel gas in the upper abdomen. IMPRESSION: 1. Abnormal bilateral pulmonary ground-glass opacity, maximal in the right upper lobe. No associated pleural effusion. 2. These lung findings are probably not significantly changed from the recent High-resolution Chest CT on 07/12/2017. The presence of fever would favor atypical infection, but please see that CT report regarding all differential diagnostic considerations. Electronically Signed   By: Genevie Ann M.D.   On: 07/18/2017 17:31   Mr Jeri Cos YQ Contrast  Result Date: 08/09/2017 CLINICAL DATA:  Non-small cell lung cancer.  Staging. EXAM: MRI HEAD WITHOUT AND WITH CONTRAST TECHNIQUE: Multiplanar, multiecho pulse sequences of the brain and surrounding structures were obtained without and with intravenous contrast. CONTRAST:  59mL MULTIHANCE GADOBENATE DIMEGLUMINE 529 MG/ML IV SOLN COMPARISON:  None. FINDINGS: Brain: Diffusion imaging does not show any acute or subacute infarction. The brainstem and cerebellum are normal. Cerebral hemispheres show mild to moderate chronic small-vessel ischemic changes of the deep and subcortical white matter. No cortical or large vessel territory infarction. No primary or metastatic mass lesion, hemorrhage, hydrocephalus or extra-axial collection. No abnormal enhancement. Vascular: Major vessels at the base of the brain show flow. Skull and upper cervical spine: Negative Sinuses/Orbits: Clear/normal Other: None IMPRESSION: No evidence of metastatic disease. Mild to moderate chronic small-vessel ischemic changes of the cerebral hemispheric white matter. Electronically Signed   By: Nelson Chimes M.D.   On: 08/09/2017 10:21   Ct Abdomen Pelvis W Contrast  Result Date: 07/18/2017 CLINICAL DATA:  Nausea and vomiting.  Cough and congestion. EXAM: CT  ABDOMEN AND PELVIS WITH CONTRAST TECHNIQUE: Multidetector CT imaging of the abdomen and pelvis was performed using the standard protocol following bolus administration of intravenous contrast. CONTRAST:  144mL ISOVUE-300 IOPAMIDOL (ISOVUE-300) INJECTION 61% COMPARISON:  CT chest 07/12/2017. FINDINGS: Lower chest: Ground-glass attenuation throughout the lower lobes, LEFT greater than RIGHT, with consolidation, mildly increased from prior CT chest. Emphysematous changes. Hepatobiliary: No focal liver abnormality is seen. No gallstones, gallbladder wall thickening, or biliary dilatation. Pancreas: Unremarkable. No pancreatic ductal dilatation or surrounding inflammatory changes. Spleen: Normal in size without focal abnormality. Adrenals/Urinary Tract: Adrenal glands are unremarkable. Kidneys are normal, without renal calculi, focal lesion, or hydronephrosis. Bladder is unremarkable. Stomach/Bowel: Stomach is within normal limits. No visible appendiceal inflammation, may be surgically absent. No evidence of bowel wall thickening, distention, or inflammatory changes. Vascular/Lymphatic: Aortic atherosclerosis. No enlarged abdominal or pelvic lymph nodes. Reproductive: Status post hysterectomy. No adnexal masses. Other: Small fat containing umbilical hernia. Musculoskeletal: No acute or significant osseous findings. IMPRESSION: No acute or worrisome findings  in the abdomen and pelvis. No bowel obstruction, free fluid, or free air. Appendix not visualized, may be surgically absent. BILATERAL lower lobe opacities, greater on the LEFT, are mildly increased from CT chest several days ago. Correlate clinically for pneumonia. Aortic Atherosclerosis (ICD10-I70.0) and Emphysema (ICD10-J43.9). Electronically Signed   By: Staci Righter M.D.   On: 07/18/2017 20:20   Ct Chest High Resolution  Result Date: 07/12/2017 CLINICAL DATA:  Chronic dyspnea. Former smoker. Reported history of right lung surgery for carcinoid in 2000. EXAM:  CT CHEST WITHOUT CONTRAST TECHNIQUE: Multidetector CT imaging of the chest was performed following the standard protocol without intravenous contrast. High resolution imaging of the lungs, as well as inspiratory and expiratory imaging, was performed. COMPARISON:  07/03/2017 chest radiograph.  05/12/2017 chest CT. FINDINGS: Cardiovascular: Normal heart size. No significant pericardial fluid/thickening. Atherosclerotic nonaneurysmal thoracic aorta. Top-normal caliber main pulmonary artery (3.0 cm diameter), stable. Mediastinum/Nodes: No discrete thyroid nodules. Unremarkable esophagus. No pathologically enlarged axillary, mediastinal or gross hilar lymph nodes, noting limited sensitivity for the detection of hilar adenopathy on this noncontrast study. Lungs/Pleura: Stable postsurgical changes in the right lung from apparent right middle lobe wedge resection. No pneumothorax. No pleural effusion. There is extensive mixed ground-glass attenuation and consolidation involving the entire dependent right upper lobe, which is persistent since 05/12/2017 chest CT with worsening of the consolidative component in the interval. There is widespread peribronchovascular ground-glass attenuation throughout the bilateral lower lobes and right middle lobe, which has slightly worsened. Persistent mild patchy consolidation in the basilar left lower lobe. No significant air trapping on the expiration sequence. There is mild tracheobronchomalacia on the expiration sequence. Mild-to-moderate centrilobular emphysema. No significant regions of traction bronchiectasis or frank honeycombing. Upper abdomen: No acute abnormality. Musculoskeletal: No aggressive appearing focal osseous lesions. Mild thoracic spondylosis. IMPRESSION: 1. Persistent extensive mixed ground-glass attenuation and consolidation throughout the dependent right upper lobe, with interval worsening of the consolidative component. Widespread peribronchovascular ground-glass  attenuation throughout the remnant right middle lobe and bilateral lower lobes, slightly worsened. This process is indeterminate. At this point, the possibility of right upper lobe lung adenocarcinoma with "aerogenous" bilateral lower lung metastases should be considered. Atypical infection is on the differential. Consider bronchoscopy and/or surgical consultation. 2. Please note that the postsurgical changes in the right lung appear to represent a right middle lobe wedge resection (the right lower lobe appears intact). 3. Mild tracheobronchomalacia. Aortic Atherosclerosis (ICD10-I70.0) and Emphysema (ICD10-J43.9). These results were called by telephone at the time of interpretation on 07/12/2017 at 1:30 pm to Dr. Teressa Lower , who verbally acknowledged these results. Electronically Signed   By: Ilona Sorrel M.D.   On: 07/12/2017 13:44   Nm Pet Image Initial (pi) Skull Base To Thigh  Result Date: 08/09/2017 CLINICAL DATA:  Initial treatment strategy for lung cancer. EXAM: NUCLEAR MEDICINE PET SKULL BASE TO THIGH TECHNIQUE: 10.5 mCi F-18 FDG was injected intravenously. Full-ring PET imaging was performed from the skull base to thigh after the radiotracer. CT data was obtained and used for attenuation correction and anatomic localization. Fasting blood glucose: 117 mg/dl COMPARISON:  CT abdomen pelvis 07/18/2017 and CT chest 07/12/2017. FINDINGS: Mediastinal blood pool activity: SUV max 2.1 NECK: Focal hypermetabolism is seen in the anterior hypopharynx without a CT correlate. No hypermetabolic lymph nodes. Incidental CT findings: None. CHEST: No hypermetabolic mediastinal, hilar or axillary lymph nodes. There is patchy ground-glass and consolidation in the right upper lobe with additional patchy ground-glass in lower lobes bilaterally, with low level associated  hypermetabolism. No hypermetabolic pulmonary nodules. Incidental CT findings: Atherosclerotic calcification of the arterial vasculature. No pericardial or  pleural effusion. ABDOMEN/PELVIS: No abnormal hypermetabolism in the liver, adrenal glands, spleen or pancreas. No hypermetabolic lymph nodes. Incidental CT findings: Atherosclerotic calcification of the arterial vasculature without abdominal aortic aneurysm. SKELETON: No abnormal osseous hypermetabolism. Incidental CT findings: Degenerative changes in the spine. IMPRESSION: 1. Borderline hypermetabolic right upper lobe consolidation and ground-glass with hypometabolic patchy ground-glass in both lower lobes. Findings may be infectious or inflammatory in etiology. Difficult to definitively exclude low-grade adenocarcinoma in the right upper lobe. 2. Focal hypermetabolism along the ventral aspect of the hypopharynx without a CT correlate. 3.  Aortic atherosclerosis (ICD10-170.0). Electronically Signed   By: Lorin Picket M.D.   On: 08/09/2017 10:46   Dg Chest Port 1 View  Result Date: 07/20/2017 CLINICAL DATA:  Status post bronchoscopy EXAM: PORTABLE CHEST 1 VIEW COMPARISON:  Two days ago FINDINGS: Increased airspace opacity in the right upper lobe. Low volume chest after the procedure. No evidence of air leak. Scarring or atelectasis at the right base. Normal heart size. Artifact from EKG leads. IMPRESSION: Increased right upper lobe opacity after bronchoscopy. No evidence of air leak. Electronically Signed   By: Monte Fantasia M.D.   On: 07/20/2017 08:25   Dg C-arm Bronchoscopy  Result Date: 07/20/2017 C-ARM BRONCHOSCOPY: Fluoroscopy was utilized by the requesting physician.  No radiographic interpretation.     ASSESSMENT/PLAN:  Adenocarcinoma of right lung, stage 4 (HCC) This is a very pleasant 77 year old white female with highly suspicious stage IV (T3, N0, M1 a) non-small cell lung cancer, well-differentiated adenocarcinoma presented with multifocal disease involving mainly the right upper lobe as well as the right middle lobe and left lower lobe of the lung diagnosed in May 2019. The patient  had a recent MRI of the brain and PET scan is here to discuss the results and treatment options.  The patient was seen with Dr. [name].  Discussed that MRI of the brain did not show any evidence of metastatic disease.  Discussed PET scan results which did not show evidence of disease outside of the lungs.  Guardant 360 results were discussed with the patient which showed no actionable mutations. The patient's diagnosis, prognosis, and treatment options were discussed with the patient and her husband.  Recommend treatment with carboplatin for an AUC of 5, Alimta 500 mg meter squared, and Keytruda 200 mg IV every 3 weeks.  Plan is for 4 cycles followed by maintenance Alimta and Keytruda.  Discussed with the patient adverse effects of this treatment including but not limited to alopecia, myelosuppression, nausea and vomiting, peripheral neuropathy, liver or renal dysfunction in addition to the adverse effects of  immunotherapy including but not limited to immune mediated the skin rash, diarrhea, inflammation of the lung, kidney, liver, thyroid or other endocrine dysfunction. We will arrange for the patient to receive vitamin B 12 injection today. The patient also received a prescription for Compazine 10 mg by mouth every 6 hours as needed for nausea and a prescription for folic acid 1 mg by mouth daily.  We will arrange for the patient to have a chemotherapy education class. We will also have a Port-A-Cath placed prior to the start of chemotherapy.  EMLA cream was sent to her pharmacy. The patient will have weekly labs while receiving chemotherapy.  Anticipate first dose of chemotherapy on 08/17/2017. The patient will follow-up in approximately 1 week after her first cycle of treatment to evaluate  for side effects.  She was advised to call immediately if she has any concerning symptoms in the interval.  The patient voices understanding of current disease status and treatment options and is in agreement with  the current care plan.  All questions were answered. The patient knows to call the clinic with any problems, questions or concerns. We can certainly see the patient much sooner if necessary.   Orders Placed This Encounter  Procedures  . IR Fluoro Guide CV Line Right    Standing Status:   Future    Standing Expiration Date:   10/11/2018    Order Specific Question:   Reason for exam:    Answer:   PAC placement for chemo.    Order Specific Question:   Preferred Imaging Location?    Answer:   [facility]  . CBC with Differential ([place] Only)    Standing Status:   Standing    Number of Occurrences:   20    Standing Expiration Date:   08/11/2018  . CMP ([facility] only)    Standing Status:   Standing    Number of Occurrences:   20    Standing Expiration Date:   08/11/2018  . TSH    Standing Status:   Standing    Number of Occurrences:   20    Standing Expiration Date:   08/11/2018  . Amb Referral to Nutrition and Diabetic E    Referral Priority:   Routine    Referral Type:   Consultation    Referral Reason:   Specialty Services Required    Number of Visits Requested:   1   Mikey Bussing, DNP, AGPCNP-BC, AOCNP 08/10/17   ADDENDUM: Hematology/Oncology Attending: I had a face-to-face encounter with the patient.  I recommended her care plan.  This is a very pleasant 77 years old white female recently diagnosed with metastatic non-small cell lung cancer, adenocarcinoma presented with multifocal disease mainly in the right lower lobe.  The patient had a recent PET scan that showed no concerning findings for disease metastasis outside the chest.  She also had MRI of the brain that showed no intracranial metastasis. I discussed the PET scan and MRI results with the patient and her husband today.  She also had molecular studies done by Guardant 360 that showed no actionable mutations. I discussed with the patient her treatment options including palliative care versus  consideration of palliative systemic chemotherapy with carboplatin for AUC of 5, Alimta 500 mg/M2 and Keytruda 200 mg IV every 3 weeks.  The patient is interested in proceeding with systemic chemotherapy.  I discussed with her the adverse effect of this treatment including but not limited to alopecia, myelosuppression, nausea and vomiting, peripheral neuropathy, liver or renal dysfunction in addition to the immunotherapy adverse effects including but not limited to immunotherapy mediated skin rash, diarrhea, inflammation of the lung, kidney, liver, thyroid or other endocrine dysfunction. She is expected to start the first cycle of her treatment next week.  The patient will receive vitamin B12 injection today.  We will also call her pharmacy with prescription for Compazine, folic acid and Emla Cream for Port-A-Cath. The patient will come back for follow-up visit in 2 weeks for evaluation and management of any adverse effect of her treatment. She was advised to call immediately if she has any concerning symptoms in the interval.  Disclaimer: This note was dictated with voice recognition software. Similar sounding words can inadvertently be transcribed and may be missed  upon review. Eilleen Kempf, MD 08/11/17

## 2017-08-10 NOTE — Telephone Encounter (Signed)
Scheduled appt per 5/30 los - gave patient aVS and calender per los.

## 2017-08-10 NOTE — Assessment & Plan Note (Signed)
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 The patient had a recent MRI of the brain and PET scan is here to discuss the results and treatment options.  The patient was seen with Dr. [name].  Discussed that MRI of the brain did not show any evidence of metastatic disease.  Discussed PET scan results which did not show evidence of disease outside of the lungs.  Guardant 360 results were discussed with the patient which showed no actionable mutations. The patient's diagnosis, prognosis, and treatment options were discussed with the patient and her husband.  Recommend treatment with carboplatin for an AUC of 5, Alimta 500 mg meter squared, and Keytruda 200 mg IV every 3 weeks.  Plan is for 4 cycles followed by maintenance Alimta and Keytruda.  Discussed with the patient adverse effects of this treatment including but not limited to alopecia, myelosuppression, nausea and vomiting, peripheral neuropathy, liver or renal dysfunction in addition to the adverse effects of immunotherapy including but not limited to immune mediated the skin rash, diarrhea, inflammation of the lung, kidney, liver, thyroid or other endocrine dysfunction. We will arrange for the patient to receive vitamin B 12 injection today. The patient also received a prescription for Compazine 10 mg by mouth every 6 hours as needed for nausea and a prescription for folic acid 1 mg by mouth daily.  We will arrange for the patient to have a chemotherapy education class. We will also have a Port-A-Cath placed prior to the start of chemotherapy.  EMLA cream was sent to her pharmacy. The patient will have weekly labs while receiving chemotherapy.  Anticipate first dose of chemotherapy  on 08/17/2017. The patient will follow-up in approximately 1 week after her first cycle of treatment to evaluate for side effects.  She was advised to call immediately if she has any concerning symptoms in the interval.  The patient voices understanding of current disease status and treatment options and is in agreement with the current care plan.  All questions were answered. The patient knows to call the clinic with any problems, questions or concerns. We can certainly see the patient much sooner if necessary.

## 2017-08-10 NOTE — Progress Notes (Signed)
START ON PATHWAY REGIMEN - Non-Small Cell Lung     A cycle is every 21 days:     Pembrolizumab      Pemetrexed      Carboplatin   **Always confirm dose/schedule in your pharmacy ordering system**    Patient Characteristics: Stage IV Metastatic, Nonsquamous, Initial Chemotherapy/Immunotherapy, PS = 0, 1, PD-L1 Expression Positive 1-49% (TPS) / Negative / Not Tested / Awaiting Test Results AJCC T Category: T3 Current Disease Status: Distant Metastases AJCC N Category: N0 AJCC M Category: M1a AJCC 8 Stage Grouping: IVA Histology: Nonsquamous Cell ROS1 Rearrangement Status: Negative T790M Mutation Status: Not Applicable - EGFR Mutation Negative/Unknown Other Mutations/Biomarkers: No Other Actionable Mutations NTRK Gene Fusion Status: Negative PD-L1 Expression Status: Quantity Not Sufficient Chemotherapy/Immunotherapy LOT: Initial Chemotherapy/Immunotherapy Molecular Targeted Therapy: Not Appropriate ALK Translocation Status: Negative EGFR Mutation Status: Negative/Wild Type BRAF V600E Mutation Status: Negative Performance Status: PS = 0, 1 Intent of Therapy: Non-Curative / Palliative Intent, Discussed with Patient

## 2017-08-10 NOTE — Patient Instructions (Signed)
Carboplatin injection What is this medicine? CARBOPLATIN (KAR boe pla tin) is a chemotherapy drug. It targets fast dividing cells, like cancer cells, and causes these cells to die. This medicine is used to treat ovarian cancer and many other cancers. This medicine may be used for other purposes; ask your health care provider or pharmacist if you have questions. COMMON BRAND NAME(S): Paraplatin What should I tell my health care provider before I take this medicine? They need to know if you have any of these conditions: -blood disorders -hearing problems -kidney disease -recent or ongoing radiation therapy -an unusual or allergic reaction to carboplatin, cisplatin, other chemotherapy, other medicines, foods, dyes, or preservatives -pregnant or trying to get pregnant -breast-feeding How should I use this medicine? This drug is usually given as an infusion into a vein. It is administered in a hospital or clinic by a specially trained health care professional. Talk to your pediatrician regarding the use of this medicine in children. Special care may be needed. Overdosage: If you think you have taken too much of this medicine contact a poison control center or emergency room at once. NOTE: This medicine is only for you. Do not share this medicine with others. What if I miss a dose? It is important not to miss a dose. Call your doctor or health care professional if you are unable to keep an appointment. What may interact with this medicine? -medicines for seizures -medicines to increase blood counts like filgrastim, pegfilgrastim, sargramostim -some antibiotics like amikacin, gentamicin, neomycin, streptomycin, tobramycin -vaccines Talk to your doctor or health care professional before taking any of these medicines: -acetaminophen -aspirin -ibuprofen -ketoprofen -naproxen This list may not describe all possible interactions. Give your health care provider a list of all the medicines, herbs,  non-prescription drugs, or dietary supplements you use. Also tell them if you smoke, drink alcohol, or use illegal drugs. Some items may interact with your medicine. What should I watch for while using this medicine? Your condition will be monitored carefully while you are receiving this medicine. You will need important blood work done while you are taking this medicine. This drug may make you feel generally unwell. This is not uncommon, as chemotherapy can affect healthy cells as well as cancer cells. Report any side effects. Continue your course of treatment even though you feel ill unless your doctor tells you to stop. In some cases, you may be given additional medicines to help with side effects. Follow all directions for their use. Call your doctor or health care professional for advice if you get a fever, chills or sore throat, or other symptoms of a cold or flu. Do not treat yourself. This drug decreases your body's ability to fight infections. Try to avoid being around people who are sick. This medicine may increase your risk to bruise or bleed. Call your doctor or health care professional if you notice any unusual bleeding. Be careful brushing and flossing your teeth or using a toothpick because you may get an infection or bleed more easily. If you have any dental work done, tell your dentist you are receiving this medicine. Avoid taking products that contain aspirin, acetaminophen, ibuprofen, naproxen, or ketoprofen unless instructed by your doctor. These medicines may hide a fever. Do not become pregnant while taking this medicine. Women should inform their doctor if they wish to become pregnant or think they might be pregnant. There is a potential for serious side effects to an unborn child. Talk to your health care professional or  pharmacist for more information. Do not breast-feed an infant while taking this medicine. What side effects may I notice from receiving this medicine? Side effects  that you should report to your doctor or health care professional as soon as possible: -allergic reactions like skin rash, itching or hives, swelling of the face, lips, or tongue -signs of infection - fever or chills, cough, sore throat, pain or difficulty passing urine -signs of decreased platelets or bleeding - bruising, pinpoint red spots on the skin, black, tarry stools, nosebleeds -signs of decreased red blood cells - unusually weak or tired, fainting spells, lightheadedness -breathing problems -changes in hearing -changes in vision -chest pain -high blood pressure -low blood counts - This drug may decrease the number of white blood cells, red blood cells and platelets. You may be at increased risk for infections and bleeding. -nausea and vomiting -pain, swelling, redness or irritation at the injection site -pain, tingling, numbness in the hands or feet -problems with balance, talking, walking -trouble passing urine or change in the amount of urine Side effects that usually do not require medical attention (report to your doctor or health care professional if they continue or are bothersome): -hair loss -loss of appetite -metallic taste in the mouth or changes in taste This list may not describe all possible side effects. Call your doctor for medical advice about side effects. You may report side effects to FDA at 1-800-FDA-1088. Where should I keep my medicine? This drug is given in a hospital or clinic and will not be stored at home. NOTE: This sheet is a summary. It may not cover all possible information. If you have questions about this medicine, talk to your doctor, pharmacist, or health care provider.  2018 Elsevier/Gold Standard (2007-06-05 14:38:05)  Pemetrexed injection What is this medicine? PEMETREXED (PEM e TREX ed) is a chemotherapy drug used to treat lung cancers like non-small cell lung cancer and mesothelioma. It may also be used to treat other cancers. This  medicine may be used for other purposes; ask your health care provider or pharmacist if you have questions. COMMON BRAND NAME(S): Alimta What should I tell my health care provider before I take this medicine? They need to know if you have any of these conditions: -infection (especially a virus infection such as chickenpox, cold sores, or herpes) -kidney disease -low blood counts, like low white cell, platelet, or red cell counts -lung or breathing disease, like asthma -radiation therapy -an unusual or allergic reaction to pemetrexed, other medicines, foods, dyes, or preservative -pregnant or trying to get pregnant -breast-feeding How should I use this medicine? This drug is given as an infusion into a vein. It is administered in a hospital or clinic by a specially trained health care professional. Talk to your pediatrician regarding the use of this medicine in children. Special care may be needed. Overdosage: If you think you have taken too much of this medicine contact a poison control center or emergency room at once. NOTE: This medicine is only for you. Do not share this medicine with others. What if I miss a dose? It is important not to miss your dose. Call your doctor or health care professional if you are unable to keep an appointment. What may interact with this medicine? This medicine may interact with the following medications: -Ibuprofen This list may not describe all possible interactions. Give your health care provider a list of all the medicines, herbs, non-prescription drugs, or dietary supplements you use. Also tell them if you  smoke, drink alcohol, or use illegal drugs. Some items may interact with your medicine. What should I watch for while using this medicine? Visit your doctor for checks on your progress. This drug may make you feel generally unwell. This is not uncommon, as chemotherapy can affect healthy cells as well as cancer cells. Report any side effects. Continue  your course of treatment even though you feel ill unless your doctor tells you to stop. In some cases, you may be given additional medicines to help with side effects. Follow all directions for their use. Call your doctor or health care professional for advice if you get a fever, chills or sore throat, or other symptoms of a cold or flu. Do not treat yourself. This drug decreases your body's ability to fight infections. Try to avoid being around people who are sick. This medicine may increase your risk to bruise or bleed. Call your doctor or health care professional if you notice any unusual bleeding. Be careful brushing and flossing your teeth or using a toothpick because you may get an infection or bleed more easily. If you have any dental work done, tell your dentist you are receiving this medicine. Avoid taking products that contain aspirin, acetaminophen, ibuprofen, naproxen, or ketoprofen unless instructed by your doctor. These medicines may hide a fever. Call your doctor or health care professional if you get diarrhea or mouth sores. Do not treat yourself. To protect your kidneys, drink water or other fluids as directed while you are taking this medicine. Do not become pregnant while taking this medicine or for 6 months after stopping it. Women should inform their doctor if they wish to become pregnant or think they might be pregnant. Men should not father a child while taking this medicine and for 3 months after stopping it. This may interfere with the ability to father a child. You should talk to your doctor or health care professional if you are concerned about your fertility. There is a potential for serious side effects to an unborn child. Talk to your health care professional or pharmacist for more information. Do not breast-feed an infant while taking this medicine or for 1 week after stopping it. What side effects may I notice from receiving this medicine? Side effects that you should report  to your doctor or health care professional as soon as possible: -allergic reactions like skin rash, itching or hives, swelling of the face, lips, or tongue -breathing problems -redness, blistering, peeling or loosening of the skin, including inside the mouth -signs and symptoms of bleeding such as bloody or black, tarry stools; red or dark-brown urine; spitting up blood or brown material that looks like coffee grounds; red spots on the skin; unusual bruising or bleeding from the eye, gums, or nose -signs and symptoms of infection like fever or chills; cough; sore throat; pain or trouble passing urine -signs and symptoms of kidney injury like trouble passing urine or change in the amount of urine -signs and symptoms of liver injury like dark yellow or brown urine; general ill feeling or flu-like symptoms; light-colored stools; loss of appetite; nausea; right upper belly pain; unusually weak or tired; yellowing of the eyes or skin Side effects that usually do not require medical attention (report to your doctor or health care professional if they continue or are bothersome): -constipation -dizziness -mouth sores -nausea, vomiting -pain, tingling, numbness in the hands or feet -unusually weak or tired This list may not describe all possible side effects. Call your doctor for  medical advice about side effects. You may report side effects to FDA at 1-800-FDA-1088. Where should I keep my medicine? This drug is given in a hospital or clinic and will not be stored at home. NOTE: This sheet is a summary. It may not cover all possible information. If you have questions about this medicine, talk to your doctor, pharmacist, or health care provider.  2018 Elsevier/Gold Standard (2015-12-29 18:51:46)  Pembrolizumab injection What is this medicine? PEMBROLIZUMAB (pem broe liz ue mab) is a monoclonal antibody. It is used to treat melanoma, head and neck cancer, Hodgkin lymphoma, non-small cell lung cancer,  urothelial cancer, stomach cancer, and cancers that have a certain genetic condition. This medicine may be used for other purposes; ask your health care provider or pharmacist if you have questions. COMMON BRAND NAME(S): Keytruda What should I tell my health care provider before I take this medicine? They need to know if you have any of these conditions: -diabetes -immune system problems -inflammatory bowel disease -liver disease -lung or breathing disease -lupus -organ transplant -an unusual or allergic reaction to pembrolizumab, other medicines, foods, dyes, or preservatives -pregnant or trying to get pregnant -breast-feeding How should I use this medicine? This medicine is for infusion into a vein. It is given by a health care professional in a hospital or clinic setting. A special MedGuide will be given to you before each treatment. Be sure to read this information carefully each time. Talk to your pediatrician regarding the use of this medicine in children. While this drug may be prescribed for selected conditions, precautions do apply. Overdosage: If you think you have taken too much of this medicine contact a poison control center or emergency room at once. NOTE: This medicine is only for you. Do not share this medicine with others. What if I miss a dose? It is important not to miss your dose. Call your doctor or health care professional if you are unable to keep an appointment. What may interact with this medicine? Interactions have not been studied. Give your health care provider a list of all the medicines, herbs, non-prescription drugs, or dietary supplements you use. Also tell them if you smoke, drink alcohol, or use illegal drugs. Some items may interact with your medicine. This list may not describe all possible interactions. Give your health care provider a list of all the medicines, herbs, non-prescription drugs, or dietary supplements you use. Also tell them if you smoke,  drink alcohol, or use illegal drugs. Some items may interact with your medicine. What should I watch for while using this medicine? Your condition will be monitored carefully while you are receiving this medicine. You may need blood work done while you are taking this medicine. Do not become pregnant while taking this medicine or for 4 months after stopping it. Women should inform their doctor if they wish to become pregnant or think they might be pregnant. There is a potential for serious side effects to an unborn child. Talk to your health care professional or pharmacist for more information. Do not breast-feed an infant while taking this medicine or for 4 months after the last dose. What side effects may I notice from receiving this medicine? Side effects that you should report to your doctor or health care professional as soon as possible: -allergic reactions like skin rash, itching or hives, swelling of the face, lips, or tongue -bloody or black, tarry -breathing problems -changes in vision -chest pain -chills -constipation -cough -dizziness or feeling faint or lightheaded -  fast or irregular heartbeat -fever -flushing -hair loss -low blood counts - this medicine may decrease the number of white blood cells, red blood cells and platelets. You may be at increased risk for infections and bleeding. -muscle pain -muscle weakness -persistent headache -signs and symptoms of high blood sugar such as dizziness; dry mouth; dry skin; fruity breath; nausea; stomach pain; increased hunger or thirst; increased urination -signs and symptoms of kidney injury like trouble passing urine or change in the amount of urine -signs and symptoms of liver injury like dark urine, light-colored stools, loss of appetite, nausea, right upper belly pain, yellowing of the eyes or skin -stomach pain -sweating -weight loss Side effects that usually do not require medical attention (report to your doctor or health  care professional if they continue or are bothersome): -decreased appetite -diarrhea -tiredness This list may not describe all possible side effects. Call your doctor for medical advice about side effects. You may report side effects to FDA at 1-800-FDA-1088. Where should I keep my medicine? This drug is given in a hospital or clinic and will not be stored at home. NOTE: This sheet is a summary. It may not cover all possible information. If you have questions about this medicine, talk to your doctor, pharmacist, or health care provider.  2018 Elsevier/Gold Standard (2015-12-08 12:29:36)

## 2017-08-11 ENCOUNTER — Telehealth: Payer: Self-pay | Admitting: *Deleted

## 2017-08-11 NOTE — Telephone Encounter (Signed)
Pt called lmovm states "  U gave my first tx next week- ALimta and it says to tell your Dr. If you have any of the following things. I want him to know I have Herpes. I have never been diagnosed  But I have a few breakouts a year. I know I have it because when I got married my husband had it and then I got it. Just wanted to let Dr. [name] know in case it's a problem" Returned call to pt after discussed concerns w/ NP (MD out of office),

## 2017-08-11 NOTE — Telephone Encounter (Signed)
"  Yesterday I received Premexted information sheet which reads to notify provider of certain illnesses.  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  I have two to three break outs a year.  Not on any medications, I just manage it myself.  No doctor diagnosed or following me for this but I know it has to be herpes."  Call transferred to collaborative for further assistance.

## 2017-08-12 ENCOUNTER — Other Ambulatory Visit: Payer: Self-pay

## 2017-08-12 ENCOUNTER — Encounter (HOSPITAL_COMMUNITY): Payer: Self-pay | Admitting: Emergency Medicine

## 2017-08-12 ENCOUNTER — Emergency Department (HOSPITAL_COMMUNITY)
Admission: EM | Admit: 2017-08-12 | Discharge: 2017-08-12 | Disposition: A | Discharge status: Home / Self Care | Payer: PPO | Attending: Emergency Medicine | Admitting: Emergency Medicine

## 2017-08-12 DIAGNOSIS — E119 Type 2 diabetes mellitus without complications: Secondary | ICD-10-CM | POA: Diagnosis not present

## 2017-08-12 DIAGNOSIS — Z8543 Personal history of malignant neoplasm of ovary: Secondary | ICD-10-CM | POA: Insufficient documentation

## 2017-08-12 DIAGNOSIS — Z87891 Personal history of nicotine dependence: Secondary | ICD-10-CM | POA: Insufficient documentation

## 2017-08-12 DIAGNOSIS — R0602 Shortness of breath: Secondary | ICD-10-CM | POA: Insufficient documentation

## 2017-08-12 DIAGNOSIS — R197 Diarrhea, unspecified: Secondary | ICD-10-CM | POA: Diagnosis not present

## 2017-08-12 DIAGNOSIS — R6883 Chills (without fever): Secondary | ICD-10-CM | POA: Diagnosis not present

## 2017-08-12 DIAGNOSIS — Z79899 Other long term (current) drug therapy: Secondary | ICD-10-CM | POA: Diagnosis not present

## 2017-08-12 DIAGNOSIS — K625 Hemorrhage of anus and rectum: Secondary | ICD-10-CM

## 2017-08-12 DIAGNOSIS — R1032 Left lower quadrant pain: Secondary | ICD-10-CM | POA: Diagnosis not present

## 2017-08-12 DIAGNOSIS — Z85118 Personal history of other malignant neoplasm of bronchus and lung: Secondary | ICD-10-CM | POA: Diagnosis not present

## 2017-08-12 DIAGNOSIS — E039 Hypothyroidism, unspecified: Secondary | ICD-10-CM | POA: Insufficient documentation

## 2017-08-12 DIAGNOSIS — I10 Essential (primary) hypertension: Secondary | ICD-10-CM | POA: Insufficient documentation

## 2017-08-12 LAB — TYPE AND SCREEN
ABO/RH(D): A NEG
Antibody Screen: NEGATIVE

## 2017-08-12 LAB — COMPREHENSIVE METABOLIC PANEL
ALBUMIN: 3.4 g/dL — AB (ref 3.5–5.0)
ALK PHOS: 52 U/L (ref 38–126)
ALT: 20 U/L (ref 14–54)
AST: 22 U/L (ref 15–41)
Anion gap: 12 (ref 5–15)
BUN: 8 mg/dL (ref 6–20)
CALCIUM: 9.5 mg/dL (ref 8.9–10.3)
CO2: 32 mmol/L (ref 22–32)
CREATININE: 0.86 mg/dL (ref 0.44–1.00)
Chloride: 100 mmol/L — ABNORMAL LOW (ref 101–111)
GFR calc Af Amer: >60 mL/min (ref >60)
GFR calc non Af Amer: >60 mL/min (ref >60)
GLUCOSE: 104 mg/dL — AB (ref 65–99)
Potassium: 3.5 mmol/L (ref 3.5–5.1)
SODIUM: 144 mmol/L (ref 135–145)
Total Bilirubin: 0.9 mg/dL (ref 0.3–1.2)
Total Protein: 6.4 g/dL — ABNORMAL LOW (ref 6.5–8.1)

## 2017-08-12 LAB — CBC
HCT: 56 % — ABNORMAL HIGH (ref 36.0–46.0)
HEMOGLOBIN: 17.6 g/dL — AB (ref 12.0–15.0)
MCH: 30.1 pg (ref 26.0–34.0)
MCHC: 31.4 g/dL (ref 30.0–36.0)
MCV: 95.7 fL (ref 78.0–100.0)
Platelets: 269 10*3/uL (ref 150–400)
RBC: 5.85 MIL/uL — ABNORMAL HIGH (ref 3.87–5.11)
RDW: 15.9 % — ABNORMAL HIGH (ref 11.5–15.5)
WBC: 10.8 10*3/uL — ABNORMAL HIGH (ref 4.0–10.5)

## 2017-08-12 LAB — ABO/RH: ABO/RH(D): A NEG

## 2017-08-12 LAB — POC OCCULT BLOOD, ED: Fecal Occult Bld: POSITIVE — AB

## 2017-08-12 MED ORDER — FAMOTIDINE IN NACL 20-0.9 MG/50ML-% IV SOLN
20.0000 mg | Freq: Two times a day (BID) | INTRAVENOUS | Status: DC
  Administered 2017-08-12: 20 mg via INTRAVENOUS
  Filled 2017-08-12: qty 50

## 2017-08-12 MED ORDER — MORPHINE SULFATE (PF) 4 MG/ML IV SOLN
4.0000 mg | Freq: Once | INTRAVENOUS | Status: DC
  Filled 2017-08-12: qty 1

## 2017-08-12 MED ORDER — IPRATROPIUM-ALBUTEROL 0.5-2.5 (3) MG/3ML IN SOLN
3.0000 mL | Freq: Once | RESPIRATORY_TRACT | Status: AC
  Administered 2017-08-12: 3 mL via RESPIRATORY_TRACT
  Filled 2017-08-12: qty 3

## 2017-08-12 NOTE — ED Provider Notes (Signed)
Complains of intermittent vague crampy abdominal pain since yesterday. She had 2 episodes of nonbloody watery diarrhea yesterday and an episode of bloody diarrhea today. She denies abdominal pain presently. She denies lightheadedness. Denies fever. She was on antibiotics a few weeks ago. No recent travel. Patient with history of lung cancer, recently diagnosed. Has not yet started chemotherapy on exam she is alert and in no distress lungs clear auscultation heart regular rate and rhythm abdomen obese, nontender   Orlie Dakin, MD 08/12/17 1230

## 2017-08-12 NOTE — ED Provider Notes (Addendum)
[facility] EMERGENCY DEPARTMENT Provider Note   CSN: [id number] Arrival date & time: 08/12/17  9798     History   Chief Complaint Chief Complaint  Patient presents with  . GI Bleeding    HPI Erin Myers is a 77 y.o. female with history of hypertension, lung cancer on chronic 2 L nasal cannula is here for evaluation of abdominal pain.  Abdominal pain is described as crampy, to diffuse lower abdomen worse on the left.  Abdominal pain is intermittent.  Aggravated with palpation.  Pain has been improving since onset.  Pain began last night and was mild, followed with sudden onset of chills and multiple large watery bowel movements nonbloody non-melanotic.  This morning patient had sudden urge to have a bowel movement and noticed formed brown stool with bright red blood on the toilet paper and around the stool x2, this was painless.  She has history of hysterectomy, appendectomy, oophorectomy in 2001.  No history of ulcers, GI bleed, diverticulitis.  Recently had a brain MRI and PET scan for recently diagnosed lung cancer and no metastases found outside of lungs. Current cancer tx includes prednisone taper, supposed to start chemotherapy on 6/6.    HPI  Past Medical History:  Diagnosis Date  . Anxiety   . Diabetes mellitus without complication ([place])   . Dyslipidemia   . HTN (hypertension)   . Hyperlipemia   . Hypothyroid   . Hypothyroidism   . Lung cancer ([place]) 2000   Carcinoid, s/p RLL lobectomy  . Obesity   . Ovarian cancer ([place])   . Palpitations 2009 and 2006   Negative nuclear stress test   . Sleep apnea    on C-pap    Patient Active Problem List   Diagnosis Date Noted  . Encounter for antineoplastic immunotherapy 08/10/2017  . Encounter for antineoplastic chemotherapy 08/10/2017  . Adenocarcinoma of right lung, stage 4 ([place]) 07/31/2017  . Goals of care, counseling/discussion 07/31/2017  . Sinusitis 07/19/2017  . Acute on chronic respiratory  failure with hypoxia ([place]) 07/19/2017  . Hypokalemia 07/19/2017  . Sepsis ([place]) 07/18/2017  . Abnormal CT of the chest 07/14/2017  . Hypoxemia 07/03/2017  . Community acquired pneumonia of right upper lobe of lung ([place]) 05/30/2017  . Dyspnea 05/02/2017  . 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 History of posttraumatic stress disorder (PTSD) 05/14/2015  . Obesity (BMI 30-39.9) 08/20/2012  . Family history of coronary artery disease 08/17/2012  . Palpitations   . OSA on CPAP   . Essential hypertension   . Dyslipidemia, goal LDL below 130   . Hypothyroid     Past Surgical History:  Procedure Laterality Date  . ABDOMINAL HYSTERECTOMY  2001  . GRADED EXERCISE TOLERANCE TEST  08/2015    Blood pressure demonstrated a hypertensive response to exercise.  There was less than 0.31mm of horizontal ST segment depression in the inferolateral leads. No diagnostic criterial for ischemia. Moderately impaired exercise tolerance. The patient achieved 5.8 mets. This is a low risk study.  . LOBECTOMY  2000   RLL  . NM MYOCAR PERF WALL MOTION  08/20/2007   EF 74%  EXERCISE CAPACITY 7 METS  . OOPHORECTOMY  2001  . TONSILLECTOMY    . VIDEO BRONCHOSCOPY Bilateral 07/20/2017   Procedure: VIDEO BRONCHOSCOPY WITH FLUORO;  Surgeon: Collene Gobble, MD;  Location: [facility];  Service: Cardiopulmonary;  Laterality: Bilateral;     OB History   None      Home  Medications    Prior to Admission medications   Medication Sig Start Date End Date Taking? Authorizing Provider  benzonatate (TESSALON) 100 MG capsule Take by mouth 3 (three) times daily as needed for cough.   Yes [provider]  budesonide-formoterol (SYMBICORT) 160-4.5 MCG/ACT inhaler Inhale 2 puffs into the lungs 2 (two) times daily. 07/03/17  Yes Noralee Space, MD  clorazepate (TRANXENE) 7.5 MG tablet Take 7.5 mg by mouth 2 (two) times daily as needed for anxiety.   Yes [provider]  folic acid (FOLVITE) 1 MG tablet  Take 1 tablet (1 mg total) by mouth daily. 08/10/17  Yes Curcio, Roselie Awkward, NP  ibuprofen (ADVIL,MOTRIN) 200 MG tablet Take 200 mg by mouth every 6 (six) hours as needed (Back pain).    Yes [provider]  ipratropium-albuterol (DUONEB) 0.5-2.5 (3) MG/3ML SOLN Take 3 mLs by nebulization 3 (three) times daily. 07/06/17  Yes Noralee Space, MD  levothyroxine (SYNTHROID, LEVOTHROID) 112 MCG tablet Take 112 mcg by mouth daily before breakfast.   Yes [provider]  losartan-hydrochlorothiazide (HYZAAR) 50-12.5 MG per tablet Take 1 tablet by mouth every morning.  11/11/13  Yes [provider]  metoprolol succinate (TOPROL-XL) 50 MG 24 hr tablet Take 50 mg by mouth 2 (two) times daily.    Yes [provider]  predniSONE (DELTASONE) 10 MG tablet Take 1 tablet (10 mg total) by mouth daily with breakfast. Alternate 1 tab one day and 1/2 tab the next Patient taking differently: Take 5-10 mg by mouth daily with breakfast. Alternate 10 mg tab one day and 5 mg tab the next 08/08/17  Yes Noralee Space, MD  rosuvastatin (CRESTOR) 10 MG tablet Take 10 mg by mouth every evening.    Yes [provider]  lidocaine-prilocaine (EMLA) cream Apply 1 application topically as needed. 08/10/17   Maryanna Shape, NP  prochlorperazine (COMPAZINE) 10 MG tablet Take 1 tablet (10 mg total) by mouth every 6 (six) hours as needed for nausea or vomiting. 08/10/17   Curcio, Roselie Awkward, NP  valACYclovir (VALTREX) 1000 MG tablet Take 1,000 mg by mouth daily. 08/11/17   [provider]    Family History Family History  Problem Relation Age of Onset  . Coronary artery disease Mother 89       died of an MI  . Heart attack Mother   . Suicidality Sister   . CVA Brother 77  . CVA Father     Social History Social History   Tobacco Use  . Smoking status: Former Smoker    Packs/day: 0.75    Years: 30.00    Pack years: 22.50    Types: Cigarettes    Last attempt to quit: 08/18/1991     Years since quitting: 26.0  . Smokeless tobacco: Never Used  Substance Use Topics  . Alcohol use: No    Alcohol/week: 0.0 oz  . Drug use: No     Allergies   Adhesive [tape]; Codeine; Lactose intolerance (gi); Monosodium glutamate; and Sulfa antibiotics   Review of Systems Review of Systems  Respiratory: Positive for shortness of breath (chronic 2 L [place]).   Gastrointestinal: Positive for abdominal pain and blood in stool.  Allergic/Immunologic: Positive for immunocompromised state.  All other systems reviewed and are negative.    Physical Exam Updated Vital Signs BP (!) 119/45 (BP Location: Left Arm)   Pulse 76   Temp 98.4 F (36.9 C) (Oral)   Resp 16   Ht  5' 4.75" (1.645 m)   Wt 91.6 kg (202 lb)   SpO2 (!) 88%   BMI 33.87 kg/m   Physical Exam  Constitutional: She is oriented to person, place, and time. She appears well-developed and well-nourished. No distress.  Non toxic. Pleasant. On 2-3 L [place]  HENT:  Head: Normocephalic and atraumatic.  Nose: Nose normal.  Moist mucous membranes   Eyes: Pupils are equal, round, and reactive to light. Conjunctivae and EOM are normal.  Neck: Normal range of motion.  Cardiovascular: Normal rate, regular rhythm and intact distal pulses.  2+ DP and radial pulses bilaterally. No LE edema.   Pulmonary/Chest: Effort normal. She has wheezes. She has rales.  Diffuse expiratory crackles in all lung fields. Per patient this is chronic and unchanged for the last year. SpO2 88-92%, also chronic per patient.   Abdominal: Soft. Bowel sounds are normal. There is tenderness.  LLQ tenderness with deep palpation with mild guarding. No rebound or rigidity. No suprapubic or CVA tenderness. Negative Murphy's and McBurney's.   Musculoskeletal: Normal range of motion.  Neurological: She is alert and oriented to person, place, and time.  Skin: Skin is warm and dry. Capillary refill takes less than 2 seconds.  Psychiatric: She has a normal mood and  affect. Her behavior is normal.  Nursing note and vitals reviewed.    ED Treatments / Results  Labs (all labs ordered are listed, but only abnormal results are displayed) Labs Reviewed  COMPREHENSIVE METABOLIC PANEL - Abnormal; Notable for the following components:      Result Value   Chloride 100 (*)    Glucose, Bld 104 (*)    Total Protein 6.4 (*)    Albumin 3.4 (*)    All other components within normal limits  CBC - Abnormal; Notable for the following components:   WBC 10.8 (*)    RBC 5.85 (*)    Hemoglobin 17.6 (*)    HCT 56.0 (*)    RDW 15.9 (*)    All other components within normal limits  POC OCCULT BLOOD, ED - Abnormal; Notable for the following components:   Fecal Occult Bld POSITIVE (*)    All other components within normal limits  C DIFFICILE QUICK SCREEN W PCR REFLEX  GASTROINTESTINAL PANEL BY PCR, STOOL (REPLACES STOOL CULTURE)  TYPE AND SCREEN  ABO/RH    EKG None  Radiology No results found.  Procedures Procedures (including critical care time)  Medications Ordered in ED Medications  famotidine (PEPCID) IVPB 20 mg premix (20 mg Intravenous Not Given 08/12/17 1228)  morphine 4 MG/ML injection 4 mg (4 mg Intravenous Not Given 08/12/17 1252)  ipratropium-albuterol (DUONEB) 0.5-2.5 (3) MG/3ML nebulizer solution 3 mL (3 mLs Nebulization Given 08/12/17 1228)     Initial Impression / Assessment and Plan / ED Course  I have reviewed the triage vital signs and the nursing notes.  Pertinent labs & imaging results that were available during my care of the patient were reviewed by me and considered in my medical decision making (see chart for details).  Clinical Course as of Aug 13 1447  Sat Aug 12, 2017  1212 WBC(!): 10.8 [CG]  1212 Hemoglobin(!): 17.6 [CG]    Clinical Course User Index [CG] Kinnie Feil, PA-C   77 year old female with recently diagnosed lung cancer here for bright red blood per rectum and diarrhea, brief episode of chills but no  frank fevers.  On exam, she has LLQ tenderness with deep palpation and mild guarding.  Her  pain has started to improve in the ER.  She had bright red blood per rectum on exam and no obvious fissures or hemorrhoids noted.  She has diffuse crackles bilaterally worse on the right, patient states this is chronic and where her lung cancer is.  Recently had a PET scan that shows no metastases of lung cancer outside of lungs, this is reassuring.  Differential diagnosis includes diverticulitis vs infectious diarrhea.  1212: VSS.  WBC 10.8, hemoglobin 17.6. Abdominal pain improved but persistent on my repeat abd exam at [facility].  Anticipate CTAP to r/o diverticulitis.   1230: Pt evaluated by Dr Winfred Leeds who recommends stool testing only given resolution of pain. Will attempt to collect stool in ER.   Final Clinical Impressions(s) / ED Diagnoses   1358: Pt unable to provide stool in ER. abd pain resolved.  Reevaluated patient after breathing treatment, lungs slightly improved.  States she feels at baseline in regards to her breathing, denies chest pain or shortness of breath.  She ambulated and felt well, SPO2 87 to 92% but remained asymptomatic. States breath sounds are typically like this for the last year and she attributes it to lung cancer.  CXR was not done today as she endorses chronic symptoms, without SOB, fevers. She will be discharged with close f/u in 48 hours with PCP. Very strict return precautions given. She is to go to pharmacy after ED and purchase pulse ox to monitor oxygen at home. Pt and husband are in agreement.  Final diagnoses:  Rectal bleeding    ED Discharge Orders    None       Arlean Hopping 08/12/17 1449    Orlie Dakin, MD 08/12/17 [phone]

## 2017-08-12 NOTE — ED Notes (Signed)
Pt ambulated self efficiently on 4L of O2 with no difficulty. Pt O2 sat @ 91% @ bedside. During ambulation pt O2 sat decreased to 86 % and increased to 95% toward the end of ambulation. Pt returned safely to bedside.

## 2017-08-12 NOTE — Discharge Instructions (Signed)
Return to the ER for worsening persistent abdominal pain, nausea, vomit, blood in vomit and stool, fevers, chills  Follow up with your doctor in 48 hours to check in and ensure no other concerning symptoms have emerged.   Avoid ibuprofen, aleve, advil, naproxen, goody powder's. These can cause stomach/gut irritation, ulcers and predispose to bleeding

## 2017-08-12 NOTE — ED Notes (Signed)
Pt and husband states they understand instructions home stable with hme o2. State she understands she must return for any persistant worse than normal shortness of breath or pain.

## 2017-08-12 NOTE — ED Triage Notes (Addendum)
Pt reports lower abdominal pain that began last night along with diarrhea. Pt reports this am she had a lot of bright red blood when she went to have a bowel movement. Recent diagnosis of lung cancer- has not started chemo yet. Does not take any blood thinners. Pt a/ox4, resp e/u (wears O2 at baseline), nad.

## 2017-08-14 ENCOUNTER — Inpatient Hospital Stay (HOSPITAL_BASED_OUTPATIENT_CLINIC_OR_DEPARTMENT_OTHER): Payer: PPO | Admitting: Medical

## 2017-08-14 ENCOUNTER — Ambulatory Visit (HOSPITAL_COMMUNITY)
Admission: RE | Admit: 2017-08-14 | Discharge: 2017-08-14 | Disposition: A | Discharge status: Home / Self Care | Payer: PPO | Source: Ambulatory Visit | Attending: Medical | Admitting: Medical

## 2017-08-14 ENCOUNTER — Inpatient Hospital Stay: Payer: PPO | Attending: Internal Medicine

## 2017-08-14 VITALS — BP 113/61 | HR 86 | Temp 98.1°F | Wt 206.3 lb

## 2017-08-14 DIAGNOSIS — I10 Essential (primary) hypertension: Secondary | ICD-10-CM | POA: Diagnosis not present

## 2017-08-14 DIAGNOSIS — E039 Hypothyroidism, unspecified: Secondary | ICD-10-CM

## 2017-08-14 DIAGNOSIS — F419 Anxiety disorder, unspecified: Secondary | ICD-10-CM | POA: Diagnosis not present

## 2017-08-14 DIAGNOSIS — E119 Type 2 diabetes mellitus without complications: Secondary | ICD-10-CM | POA: Diagnosis not present

## 2017-08-14 DIAGNOSIS — E785 Hyperlipidemia, unspecified: Secondary | ICD-10-CM

## 2017-08-14 DIAGNOSIS — J439 Emphysema, unspecified: Secondary | ICD-10-CM

## 2017-08-14 DIAGNOSIS — M79601 Pain in right arm: Secondary | ICD-10-CM | POA: Insufficient documentation

## 2017-08-14 DIAGNOSIS — Z5112 Encounter for antineoplastic immunotherapy: Secondary | ICD-10-CM | POA: Diagnosis not present

## 2017-08-14 DIAGNOSIS — Z8542 Personal history of malignant neoplasm of other parts of uterus: Secondary | ICD-10-CM | POA: Diagnosis not present

## 2017-08-14 DIAGNOSIS — Z5111 Encounter for antineoplastic chemotherapy: Secondary | ICD-10-CM

## 2017-08-14 DIAGNOSIS — C7802 Secondary malignant neoplasm of left lung: Secondary | ICD-10-CM | POA: Diagnosis not present

## 2017-08-14 DIAGNOSIS — I700 Atherosclerosis of aorta: Secondary | ICD-10-CM | POA: Diagnosis not present

## 2017-08-14 DIAGNOSIS — C7801 Secondary malignant neoplasm of right lung: Secondary | ICD-10-CM | POA: Insufficient documentation

## 2017-08-14 DIAGNOSIS — M5430 Sciatica, unspecified side: Secondary | ICD-10-CM | POA: Diagnosis not present

## 2017-08-14 DIAGNOSIS — G4730 Sleep apnea, unspecified: Secondary | ICD-10-CM | POA: Diagnosis not present

## 2017-08-14 DIAGNOSIS — C3411 Malignant neoplasm of upper lobe, right bronchus or lung: Secondary | ICD-10-CM | POA: Insufficient documentation

## 2017-08-14 NOTE — Progress Notes (Signed)
Pt seen by PA Lucianne Lei.  No RN assessment performed.

## 2017-08-14 NOTE — Patient Instructions (Signed)

## 2017-08-14 NOTE — Progress Notes (Signed)
RUE venous duplex prelim: negative for DVT. Superficial thrombosis noted in the cephalic vein, from [facility] fossa up to shoulder. Landry Mellow, RDMS, RVT  Called results to Dr. Satira Sark

## 2017-08-14 NOTE — Progress Notes (Signed)
[facility] Progress Note   Erin Myers [id number] October 04, 1940 77 y.o.  Erin Myers is managed by Dr. Fanny Bien. Mohamed  Actively treated with chemotherapy: no   Assessment: Plan:    Arm pain, anterior, right - Plan: VAS [place] UPPER EXTREMITY VENOUS DUPLEX  Please see After Visit Summary for patient specific instructions.  Future Appointments  Date Time Provider [place]  08/14/2017  3:00 PM [facility] VASC US 1-[facility] [place] [facility]  08/17/2017 11:30 AM [facility] ROOM [facility] None  08/17/2017  1:30 PM [facility]-IR 1 [facility] [place]  08/18/2017  9:00 AM [facility] LAB 4 [facility] None  08/18/2017  9:15 AM [facility] INJ NURSE [facility] None  08/18/2017 10:00 AM [facility] J32 DNS [facility] None  08/24/2017  8:15 AM [facility] LAB 5 [facility] None  08/24/2017  8:45 AM [facility] INJ NURSE [facility] None  08/24/2017  9:00 AM Curcio, Roselie Awkward, NP [facility] None  08/31/2017 12:00 PM Ernestene Kiel L, RD [facility] None  08/31/2017  1:00 PM [facility] LAB 6 [facility] None  08/31/2017  1:15 PM [facility] INJ NURSE [facility] None  09/07/2017  8:00 AM [facility] LAB 4 [facility] None  09/07/2017  8:15 AM [facility] FLUSH NURSE 2 [facility] None  09/07/2017  8:30 AM Curcio, Kristin R, NP [facility] None  09/07/2017  9:30 AM [facility] PROCEDURE 2 [facility] None  09/13/2017  1:00 PM [facility] LAB 4 [facility] None  09/13/2017  1:15 PM [facility] FLUSH NURSE 2 [facility] None  09/21/2017  1:15 PM [facility] LAB 1 [facility] None  09/21/2017  1:30 PM [facility] INJ NURSE [facility] None  09/28/2017  8:00 AM [facility] LAB 5 [facility] None  09/28/2017  8:15 AM [facility] FLUSH NURSE 2 [facility] None  09/28/2017  8:30 AM Curcio, Kristin R, NP [facility] None  09/28/2017  9:30 AM [facility] H28 [facility] None  10/05/2017  1:00 PM [facility] LAB 5 [facility] None  10/05/2017  1:15 PM [facility] INJ NURSE [facility] None  10/12/2017  1:00 PM  [facility] LAB 4 [facility] None  10/12/2017  1:15 PM [facility] FLUSH NURSE 2 [facility] None  10/19/2017  9:15 AM [facility] LAB 2 [facility] None  10/19/2017  9:30 AM [facility] FLUSH NURSE 2 [facility] None  10/19/2017 10:00 AM Curt Bears, MD [facility] None  10/19/2017 11:00 AM [facility] G24 [facility] None    No orders of the defined types were placed in this encounter.      Subjective:   Patient ID:  Erin Myers is a 77 y.o. (DOB 01/13/1941) female.  Chief Complaint:  Chief Complaint  Patient presents with  . Edema    HPI Erin Myers is a 77 year old female with a diagnosis of a highly suspicious stage IV (T3, N0, M1 a) non-small cell lung cancer, well differentiated adenocarcinoma who presented originally with multifocal disease involving the right upper lobe, right middle lobe, and left lower lobe in May 2019.  She was seen in the emergency room on 08/12/2017 for episodes of bright red blood per rectum.  According to her report no clear etiology was given to her rectal bleeding.  She presented to the office today for patient education when this provider was asked to see the patient for pain, swelling, and mild erythema over the right anterior upper extremity at the site where a blood pressure cuff had been placed.  Additionally the patient had an IV placed in the right hand while she was in the emergency room.  She continues on O2 via nasal cannula at 3 L/min today.  Despite  this her oxygen saturation was at 85%.  Medications: I have reviewed the patient's current medications.  Allergies:  Allergies  Allergen Reactions  . Adhesive [Tape] Other (See Comments)    Irritates skin   . Codeine Other (See Comments)    Skin crawlinf feeling  . Lactose Intolerance (Gi) Diarrhea  . Monosodium Glutamate Diarrhea  . Sulfa Antibiotics Nausea And Vomiting    Past Medical History:  Diagnosis Date  . Anxiety   . Diabetes mellitus without complication  ([place])   . Dyslipidemia   . HTN (hypertension)   . Hyperlipemia   . Hypothyroid   . Hypothyroidism   . Lung cancer ([place]) 2000   Carcinoid, s/p RLL lobectomy  . Obesity   . Ovarian cancer ([place])   . Palpitations 2009 and 2006   Negative nuclear stress test   . Sleep apnea    on C-pap    Past Surgical History:  Procedure Laterality Date  . ABDOMINAL HYSTERECTOMY  2001  . GRADED EXERCISE TOLERANCE TEST  08/2015    Blood pressure demonstrated a hypertensive response to exercise.  There was less than 0.13mm of horizontal ST segment depression in the inferolateral leads. No diagnostic criterial for ischemia. Moderately impaired exercise tolerance. The patient achieved 5.8 mets. This is a low risk study.  . LOBECTOMY  2000   RLL  . NM MYOCAR PERF WALL MOTION  08/20/2007   EF 74%  EXERCISE CAPACITY 7 METS  . OOPHORECTOMY  2001  . TONSILLECTOMY    . VIDEO BRONCHOSCOPY Bilateral 07/20/2017   Procedure: VIDEO BRONCHOSCOPY WITH FLUORO;  Surgeon: Collene Gobble, MD;  Location: [facility];  Service: Cardiopulmonary;  Laterality: Bilateral;    Family History  Problem Relation Age of Onset  . Coronary artery disease Mother 39       died of an MI  . Heart attack Mother   . Suicidality Sister   . CVA Brother 35  . CVA Father     Social History   Socioeconomic History  . Marital status: Married    Spouse name: Not on file  . Number of children: 2  . Years of education: HS  . Highest education level: Not on file  Occupational History  . Not on file  Social Needs  . Financial resource strain: Not on file  . Food insecurity:    Worry: Not on file    Inability: Not on file  . Transportation needs:    Medical: Not on file    Non-medical: Not on file  Tobacco Use  . Smoking status: Former Smoker    Packs/day: 0.75    Years: 30.00    Pack years: 22.50    Types: Cigarettes    Last attempt to quit: 08/18/1991    Years since quitting: 26.0  . Smokeless tobacco: Never Used    Substance and Sexual Activity  . Alcohol use: No    Alcohol/week: 0.0 oz  . Drug use: No  . Sexual activity: Yes    Partners: Male  Lifestyle  . Physical activity:    Days per week: Not on file    Minutes per session: Not on file  . Stress: Not on file  Relationships  . Social connections:    Talks on phone: Not on file    Gets together: Not on file    Attends religious service: Not on file    Active member of club or organization: Not on file    Attends meetings of clubs or  organizations: Not on file    Relationship status: Not on file  . Intimate partner violence:    Fear of current or ex partner: Not on file    Emotionally abused: Not on file    Physically abused: Not on file    Forced sexual activity: Not on file  Other Topics Concern  . Not on file  Social History Narrative   1-2 glasses of tea a day    Married mother of 2 (daughter close to 38 and son 45-46 years old).  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    Past Medical History, Surgical history, Social history, and Family history were reviewed and updated as appropriate.   Please see review of systems for further details on the patient's review from today.   Review of Systems:  Review of Systems  Constitutional: Negative for chills, diaphoresis and fever.  Respiratory: Positive for shortness of breath.   Skin: Positive for color change.       Right upper arm swelling and pain    Objective:   Physical Exam:  BP 113/61 (BP Location: Left Arm, Patient Position: Sitting)   Pulse 86   Temp 98.1 F (36.7 C) (Oral)   Wt 206 lb 4.8 oz (93.6 kg)   SpO2 (!) 85%   BMI 34.60 kg/m  ECOG: 0  Physical Exam  Constitutional:  The patient is receiving oxygen via nasal cannula  HENT:  Head: Normocephalic and atraumatic.  Cardiovascular: Normal rate, regular rhythm and normal heart sounds. Exam reveals no friction rub.  No murmur  heard. Pulmonary/Chest: Effort normal. No respiratory distress. She has rales (coarse breath sounds throughout both lung fields).  Neurological: She is alert. Coordination normal.  Skin: Skin is warm and dry.    Lab Review:     Component Value Date/Time   NA 144 08/12/2017 0900   K 3.5 08/12/2017 0900   CL 100 (L) 08/12/2017 0900   CO2 32 08/12/2017 0900   GLUCOSE 104 (H) 08/12/2017 0900   BUN 8 08/12/2017 0900   CREATININE 0.86 08/12/2017 0900   CREATININE 0.79 07/31/2017 1352   CALCIUM 9.5 08/12/2017 0900   PROT 6.4 (L) 08/12/2017 0900   ALBUMIN 3.4 (L) 08/12/2017 0900   AST 22 08/12/2017 0900   AST 14 07/31/2017 1352   ALT 20 08/12/2017 0900   ALT 17 07/31/2017 1352   ALKPHOS 52 08/12/2017 0900   BILITOT 0.9 08/12/2017 0900   BILITOT 0.9 07/31/2017 1352   GFRNONAA >60 08/12/2017 0900   GFRNONAA >60 07/31/2017 1352   GFRAA >60 08/12/2017 0900   GFRAA >60 07/31/2017 1352       Component Value Date/Time   WBC 10.8 (H) 08/12/2017 0900   RBC 5.85 (H) 08/12/2017 0900   HGB 17.6 (H) 08/12/2017 0900   HGB 16.3 (H) 07/31/2017 1352   HCT 56.0 (H) 08/12/2017 0900   PLT 269 08/12/2017 0900   PLT 277 07/31/2017 1352   MCV 95.7 08/12/2017 0900   MCH 30.1 08/12/2017 0900   MCHC 31.4 08/12/2017 0900   RDW 15.9 (H) 08/12/2017 0900   LYMPHSABS 1.5 07/31/2017 1352   MONOABS 0.8 07/31/2017 1352   EOSABS 0.1 07/31/2017 1352   BASOSABS 0.1 07/31/2017 1352   -------------------------------  Imaging from last 24 hours (if applicable):  Radiology interpretation: Dg Chest 2 View  Result Date: 07/18/2017 CLINICAL DATA:  77 year old female with nausea vomiting. Fever. Prior right lung surgery for  carcinoid. Cough since October 2018. EXAM: CHEST - 2 VIEW COMPARISON:  High-resolution chest CT 07/12/2017 and earlier. FINDINGS: Progressed upper and lower lung ground-glass opacity since 07/03/2017 radiographs, maximal in the right upper lobe. No consolidation. Findings are likely stable  from the recent CT. No superimposed pneumothorax or pleural effusion. Visualized tracheal air column is within normal limits. Mediastinal contours remain normal. No acute osseous abnormality identified. Paucity of bowel gas in the upper abdomen. IMPRESSION: 1. Abnormal bilateral pulmonary ground-glass opacity, maximal in the right upper lobe. No associated pleural effusion. 2. These lung findings are probably not significantly changed from the recent High-resolution Chest CT on 07/12/2017. The presence of fever would favor atypical infection, but please see that CT report regarding all differential diagnostic considerations. Electronically Signed   By: Genevie Ann M.D.   On: 07/18/2017 17:31   Mr Jeri Cos XL Contrast  Result Date: 08/09/2017 CLINICAL DATA:  Non-small cell lung cancer.  Staging. EXAM: MRI HEAD WITHOUT AND WITH CONTRAST TECHNIQUE: Multiplanar, multiecho pulse sequences of the brain and surrounding structures were obtained without and with intravenous contrast. CONTRAST:  87mL MULTIHANCE GADOBENATE DIMEGLUMINE 529 MG/ML IV SOLN COMPARISON:  None. FINDINGS: Brain: Diffusion imaging does not show any acute or subacute infarction. The brainstem and cerebellum are normal. Cerebral hemispheres show mild to moderate chronic small-vessel ischemic changes of the deep and subcortical white matter. No cortical or large vessel territory infarction. No primary or metastatic mass lesion, hemorrhage, hydrocephalus or extra-axial collection. No abnormal enhancement. Vascular: Major vessels at the base of the brain show flow. Skull and upper cervical spine: Negative Sinuses/Orbits: Clear/normal Other: None IMPRESSION: No evidence of metastatic disease. Mild to moderate chronic small-vessel ischemic changes of the cerebral hemispheric white matter. Electronically Signed   By: Nelson Chimes M.D.   On: 08/09/2017 10:21   Ct Abdomen Pelvis W Contrast  Result Date: 07/18/2017 CLINICAL DATA:  Nausea and vomiting.  Cough  and congestion. EXAM: CT ABDOMEN AND PELVIS WITH CONTRAST TECHNIQUE: Multidetector CT imaging of the abdomen and pelvis was performed using the standard protocol following bolus administration of intravenous contrast. CONTRAST:  169mL ISOVUE-300 IOPAMIDOL (ISOVUE-300) INJECTION 61% COMPARISON:  CT chest 07/12/2017. FINDINGS: Lower chest: Ground-glass attenuation throughout the lower lobes, LEFT greater than RIGHT, with consolidation, mildly increased from prior CT chest. Emphysematous changes. Hepatobiliary: No focal liver abnormality is seen. No gallstones, gallbladder wall thickening, or biliary dilatation. Pancreas: Unremarkable. No pancreatic ductal dilatation or surrounding inflammatory changes. Spleen: Normal in size without focal abnormality. Adrenals/Urinary Tract: Adrenal glands are unremarkable. Kidneys are normal, without renal calculi, focal lesion, or hydronephrosis. Bladder is unremarkable. Stomach/Bowel: Stomach is within normal limits. No visible appendiceal inflammation, may be surgically absent. No evidence of bowel wall thickening, distention, or inflammatory changes. Vascular/Lymphatic: Aortic atherosclerosis. No enlarged abdominal or pelvic lymph nodes. Reproductive: Status post hysterectomy. No adnexal masses. Other: Small fat containing umbilical hernia. Musculoskeletal: No acute or significant osseous findings. IMPRESSION: No acute or worrisome findings in the abdomen and pelvis. No bowel obstruction, free fluid, or free air. Appendix not visualized, may be surgically absent. BILATERAL lower lobe opacities, greater on the LEFT, are mildly increased from CT chest several days ago. Correlate clinically for pneumonia. Aortic Atherosclerosis (ICD10-I70.0) and Emphysema (ICD10-J43.9). Electronically Signed   By: Staci Righter M.D.   On: 07/18/2017 20:20   Nm Pet Image Initial (pi) Skull Base To Thigh  Result Date: 08/09/2017 CLINICAL DATA:  Initial treatment strategy for lung cancer. EXAM:  NUCLEAR MEDICINE PET SKULL  BASE TO THIGH TECHNIQUE: 10.5 mCi F-18 FDG was injected intravenously. Full-ring PET imaging was performed from the skull base to thigh after the radiotracer. CT data was obtained and used for attenuation correction and anatomic localization. Fasting blood glucose: 117 mg/dl COMPARISON:  CT abdomen pelvis 07/18/2017 and CT chest 07/12/2017. FINDINGS: Mediastinal blood pool activity: SUV max 2.1 NECK: Focal hypermetabolism is seen in the anterior hypopharynx without a CT correlate. No hypermetabolic lymph nodes. Incidental CT findings: None. CHEST: No hypermetabolic mediastinal, hilar or axillary lymph nodes. There is patchy ground-glass and consolidation in the right upper lobe with additional patchy ground-glass in lower lobes bilaterally, with low level associated hypermetabolism. No hypermetabolic pulmonary nodules. Incidental CT findings: Atherosclerotic calcification of the arterial vasculature. No pericardial or pleural effusion. ABDOMEN/PELVIS: No abnormal hypermetabolism in the liver, adrenal glands, spleen or pancreas. No hypermetabolic lymph nodes. Incidental CT findings: Atherosclerotic calcification of the arterial vasculature without abdominal aortic aneurysm. SKELETON: No abnormal osseous hypermetabolism. Incidental CT findings: Degenerative changes in the spine. IMPRESSION: 1. Borderline hypermetabolic right upper lobe consolidation and ground-glass with hypometabolic patchy ground-glass in both lower lobes. Findings may be infectious or inflammatory in etiology. Difficult to definitively exclude low-grade adenocarcinoma in the right upper lobe. 2. Focal hypermetabolism along the ventral aspect of the hypopharynx without a CT correlate. 3.  Aortic atherosclerosis (ICD10-170.0). Electronically Signed   By: Lorin Picket M.D.   On: 08/09/2017 10:46   Dg Chest Port 1 View  Result Date: 07/20/2017 CLINICAL DATA:  Status post bronchoscopy EXAM: PORTABLE CHEST 1 VIEW  COMPARISON:  Two days ago FINDINGS: Increased airspace opacity in the right upper lobe. Low volume chest after the procedure. No evidence of air leak. Scarring or atelectasis at the right base. Normal heart size. Artifact from EKG leads. IMPRESSION: Increased right upper lobe opacity after bronchoscopy. No evidence of air leak. Electronically Signed   By: Monte Fantasia M.D.   On: 07/20/2017 08:25   Dg C-arm Bronchoscopy  Result Date: 07/20/2017 C-ARM BRONCHOSCOPY: Fluoroscopy was utilized by the requesting physician.  No radiographic interpretation.        .vtdis

## 2017-08-15 ENCOUNTER — Other Ambulatory Visit: Payer: Self-pay | Admitting: Student

## 2017-08-16 ENCOUNTER — Other Ambulatory Visit: Payer: Self-pay | Admitting: Radiology

## 2017-08-16 LAB — GUARDANT 360

## 2017-08-17 ENCOUNTER — Encounter (HOSPITAL_COMMUNITY): Payer: Self-pay

## 2017-08-17 ENCOUNTER — Ambulatory Visit (HOSPITAL_COMMUNITY)
Admission: RE | Admit: 2017-08-17 | Discharge: 2017-08-17 | Disposition: A | Discharge status: Home / Self Care | Payer: PPO | Source: Ambulatory Visit | Attending: Oncology | Admitting: Oncology

## 2017-08-17 ENCOUNTER — Ambulatory Visit (HOSPITAL_COMMUNITY)
Admission: RE | Admit: 2017-08-17 | Discharge: 2017-08-17 | Disposition: A | Discharge status: Home / Self Care | Payer: PPO | Source: Ambulatory Visit | Attending: Internal Medicine | Admitting: Internal Medicine

## 2017-08-17 ENCOUNTER — Other Ambulatory Visit: Payer: Self-pay | Admitting: Oncology

## 2017-08-17 DIAGNOSIS — Z823 Family history of stroke: Secondary | ICD-10-CM | POA: Diagnosis not present

## 2017-08-17 DIAGNOSIS — Z79899 Other long term (current) drug therapy: Secondary | ICD-10-CM | POA: Insufficient documentation

## 2017-08-17 DIAGNOSIS — I10 Essential (primary) hypertension: Secondary | ICD-10-CM | POA: Diagnosis not present

## 2017-08-17 DIAGNOSIS — Z9981 Dependence on supplemental oxygen: Secondary | ICD-10-CM | POA: Diagnosis not present

## 2017-08-17 DIAGNOSIS — E785 Hyperlipidemia, unspecified: Secondary | ICD-10-CM | POA: Diagnosis not present

## 2017-08-17 DIAGNOSIS — Z885 Allergy status to narcotic agent status: Secondary | ICD-10-CM | POA: Diagnosis not present

## 2017-08-17 DIAGNOSIS — Z87891 Personal history of nicotine dependence: Secondary | ICD-10-CM | POA: Diagnosis not present

## 2017-08-17 DIAGNOSIS — Z8249 Family history of ischemic heart disease and other diseases of the circulatory system: Secondary | ICD-10-CM | POA: Diagnosis not present

## 2017-08-17 DIAGNOSIS — Z902 Acquired absence of lung [part of]: Secondary | ICD-10-CM | POA: Insufficient documentation

## 2017-08-17 DIAGNOSIS — Z888 Allergy status to other drugs, medicaments and biological substances status: Secondary | ICD-10-CM | POA: Diagnosis not present

## 2017-08-17 DIAGNOSIS — Z7951 Long term (current) use of inhaled steroids: Secondary | ICD-10-CM | POA: Insufficient documentation

## 2017-08-17 DIAGNOSIS — E119 Type 2 diabetes mellitus without complications: Secondary | ICD-10-CM | POA: Diagnosis not present

## 2017-08-17 DIAGNOSIS — Z882 Allergy status to sulfonamides status: Secondary | ICD-10-CM | POA: Insufficient documentation

## 2017-08-17 DIAGNOSIS — G4730 Sleep apnea, unspecified: Secondary | ICD-10-CM | POA: Insufficient documentation

## 2017-08-17 DIAGNOSIS — E669 Obesity, unspecified: Secondary | ICD-10-CM | POA: Insufficient documentation

## 2017-08-17 DIAGNOSIS — C3491 Malignant neoplasm of unspecified part of right bronchus or lung: Secondary | ICD-10-CM | POA: Diagnosis not present

## 2017-08-17 DIAGNOSIS — Z79890 Hormone replacement therapy (postmenopausal): Secondary | ICD-10-CM | POA: Diagnosis not present

## 2017-08-17 DIAGNOSIS — Z5111 Encounter for antineoplastic chemotherapy: Secondary | ICD-10-CM | POA: Diagnosis not present

## 2017-08-17 DIAGNOSIS — Z90710 Acquired absence of both cervix and uterus: Secondary | ICD-10-CM | POA: Diagnosis not present

## 2017-08-17 DIAGNOSIS — Z8543 Personal history of malignant neoplasm of ovary: Secondary | ICD-10-CM | POA: Diagnosis not present

## 2017-08-17 DIAGNOSIS — Z9889 Other specified postprocedural states: Secondary | ICD-10-CM | POA: Diagnosis not present

## 2017-08-17 DIAGNOSIS — E039 Hypothyroidism, unspecified: Secondary | ICD-10-CM | POA: Diagnosis not present

## 2017-08-17 DIAGNOSIS — E739 Lactose intolerance, unspecified: Secondary | ICD-10-CM | POA: Diagnosis not present

## 2017-08-17 DIAGNOSIS — C3490 Malignant neoplasm of unspecified part of unspecified bronchus or lung: Secondary | ICD-10-CM | POA: Diagnosis not present

## 2017-08-17 HISTORY — PX: IR FLUORO GUIDE PORT INSERTION RIGHT: IMG5741

## 2017-08-17 HISTORY — PX: IR US GUIDE VASC ACCESS RIGHT: IMG2390

## 2017-08-17 LAB — CBC
HEMATOCRIT: 52 % — AB (ref 36.0–46.0)
HEMOGLOBIN: 16.7 g/dL — AB (ref 12.0–15.0)
MCH: 31.2 pg (ref 26.0–34.0)
MCHC: 32.1 g/dL (ref 30.0–36.0)
MCV: 97 fL (ref 78.0–100.0)
Platelets: 264 10*3/uL (ref 150–400)
RBC: 5.36 MIL/uL — ABNORMAL HIGH (ref 3.87–5.11)
RDW: 15.7 % — ABNORMAL HIGH (ref 11.5–15.5)
WBC: 15.2 10*3/uL — ABNORMAL HIGH (ref 4.0–10.5)

## 2017-08-17 LAB — PROTIME-INR
INR: 0.93
Prothrombin Time: 12.3 seconds (ref 11.4–15.2)

## 2017-08-17 LAB — APTT: APTT: 27 s (ref 24–36)

## 2017-08-17 MED ORDER — FENTANYL CITRATE (PF) 100 MCG/2ML IJ SOLN
INTRAMUSCULAR | Status: AC
  Filled 2017-08-17: qty 4

## 2017-08-17 MED ORDER — CEFAZOLIN SODIUM-DEXTROSE 2-4 GM/100ML-% IV SOLN
2.0000 g | Freq: Once | INTRAVENOUS | Status: AC
  Administered 2017-08-17: 2 g via INTRAVENOUS

## 2017-08-17 MED ORDER — MIDAZOLAM HCL 2 MG/2ML IJ SOLN
INTRAMUSCULAR | Status: AC | PRN
  Administered 2017-08-17 (×4): 0.5 mg via INTRAVENOUS

## 2017-08-17 MED ORDER — FENTANYL CITRATE (PF) 100 MCG/2ML IJ SOLN
INTRAMUSCULAR | Status: AC | PRN
  Administered 2017-08-17: 25 ug via INTRAVENOUS
  Administered 2017-08-17: 50 ug via INTRAVENOUS
  Administered 2017-08-17: 25 ug via INTRAVENOUS

## 2017-08-17 MED ORDER — LIDOCAINE HCL 1 % IJ SOLN
INTRAMUSCULAR | Status: AC
  Filled 2017-08-17: qty 20

## 2017-08-17 MED ORDER — CEFAZOLIN SODIUM-DEXTROSE 2-4 GM/100ML-% IV SOLN
INTRAVENOUS | Status: AC
  Filled 2017-08-17: qty 100

## 2017-08-17 MED ORDER — SODIUM CHLORIDE 0.9 % IV SOLN
INTRAVENOUS | Status: DC
  Administered 2017-08-17: 12:00:00 via INTRAVENOUS

## 2017-08-17 MED ORDER — HEPARIN SOD (PORK) LOCK FLUSH 100 UNIT/ML IV SOLN
INTRAVENOUS | Status: AC
  Filled 2017-08-17: qty 5

## 2017-08-17 MED ORDER — MIDAZOLAM HCL 2 MG/2ML IJ SOLN
INTRAMUSCULAR | Status: AC
  Filled 2017-08-17: qty 4

## 2017-08-17 NOTE — Discharge Instructions (Signed)
Please keep dressing on for 24 hours. Do not use EMLA cream for 2 weeks.    Implanted Port Insertion, Care After This sheet gives you information about how to care for yourself after your procedure. Your health care provider may also give you more specific instructions. If you have problems or questions, contact your health care provider. What can I expect after the procedure? After your procedure, it is common to have:  Discomfort at the port insertion site.  Bruising on the skin over the port. This should improve over 3-4 days.  Follow these instructions at home: [facility] care  After your port is placed, you will get a manufacturer's information card. The card has information about your port. Keep this card with you at all times.  Take care of the port as told by your health care provider. Ask your health care provider if you or a family member can get training for taking care of the port at home. A home health care nurse may also take care of the port.  Make sure to remember what type of port you have. Incision care  Follow instructions from your health care provider about how to take care of your port insertion site. Make sure you: ? Wash your hands with soap and water before you change your bandage (dressing). If soap and water are not available, use hand sanitizer. ? Change your dressing as told by your health care provider. ? Leave stitches (sutures), skin glue, or adhesive strips in place. These skin closures may need to stay in place for 2 weeks or longer. If adhesive strip edges start to loosen and curl up, you may trim the loose edges. Do not remove adhesive strips completely unless your health care provider tells you to do that.  Check your port insertion site every day for signs of infection. Check for: ? More redness, swelling, or pain. ? More fluid or blood. ? Warmth. ? Pus or a bad smell. General instructions  Do not take baths, swim, or use a hot tub until your health  care provider approves.  Do not lift anything that is heavier than 10 lb (4.5 kg) for a week, or as told by your health care provider.  Ask your health care provider when it is okay to: ? Return to work or school. ? Resume usual physical activities or sports.  Do not drive for 24 hours if you were given a medicine to help you relax (sedative).  Take over-the-counter and prescription medicines only as told by your health care provider.  Wear a medical alert bracelet in case of an emergency. This will tell any health care providers that you have a port.  Keep all follow-up visits as told by your health care provider. This is important. Contact a health care provider if:  You cannot flush your port with saline as directed, or you cannot draw blood from the port.  You have a fever or chills.  You have more redness, swelling, or pain around your port insertion site.  You have more fluid or blood coming from your port insertion site.  Your port insertion site feels warm to the touch.  You have pus or a bad smell coming from the port insertion site. Get help right away if:  You have chest pain or shortness of breath.  You have bleeding from your port that you cannot control. Summary  Take care of the port as told by your health care provider.  Change your dressing as  told by your health care provider.  Keep all follow-up visits as told by your health care provider. This information is not intended to replace advice given to you by your health care provider. Make sure you discuss any questions you have with your health care provider. Document Released: 12/19/2012 Document Revised: 01/20/2016 Document Reviewed: 01/20/2016 Elsevier Interactive Patient Education  2017 [employer].     Moderate Conscious Sedation, Adult, Care After These instructions provide you with information about caring for yourself after your procedure. Your health care provider may also give you more  specific instructions. Your treatment has been planned according to current medical practices, but problems sometimes occur. Call your health care provider if you have any problems or questions after your procedure. What can I expect after the procedure? After your procedure, it is common:  To feel sleepy for several hours.  To feel clumsy and have poor balance for several hours.  To have poor judgment for several hours.  To vomit if you eat too soon.  Follow these instructions at home: For at least 24 hours after the procedure:   Do not: ? Participate in activities where you could fall or become injured. ? Drive. ? Use heavy machinery. ? Drink alcohol. ? Take sleeping pills or medicines that cause drowsiness. ? Make important decisions or sign legal documents. ? Take care of children on your own.  Rest. Eating and drinking  Follow the diet recommended by your health care provider.  If you vomit: ? Drink water, juice, or soup when you can drink without vomiting. ? Make sure you have little or no nausea before eating solid foods. General instructions  Have a responsible adult stay with you until you are awake and alert.  Take over-the-counter and prescription medicines only as told by your health care provider.  If you smoke, do not smoke without supervision.  Keep all follow-up visits as told by your health care provider. This is important. Contact a health care provider if:  You keep feeling nauseous or you keep vomiting.  You feel light-headed.  You develop a rash.  You have a fever. Get help right away if:  You have trouble breathing. This information is not intended to replace advice given to you by your health care provider. Make sure you discuss any questions you have with your health care provider. Document Released: 12/19/2012 Document Revised: 08/03/2015 Document Reviewed: 06/20/2015 Elsevier Interactive Patient Education  [employer].

## 2017-08-17 NOTE — H&P (Signed)
Chief Complaint: Patient was seen in consultation today for lung cancer.  Referring Physician(s): Curcio,Kristin R  Supervising Physician: Sandi Mariscal  Patient Status: [facility] - Out-pt  History of Present Illness: Erin Myers is a 77 y.o. female with a past medical history of anxiety, DM, HTN, ovarian cancer, and carcinoid tumor s/p right lower lobectomy in 2000 who recently developed progressive shortness of breath.  She was found to have right upper lobe lung adenocarcinoma confirmed via trasbronchial biopsy 07/20/17.  IR requested by Mikey Bussing, NP for possible image-guided Port-a-cath insertion. Patient awake and alert laying in bed with no complaints at this time. Accompanied by husband at bedside. Denies fever, chest pain, dyspnea, abdominal pain, or dizziness.  Patient is on home oxygen 3L via nasal cannula and presented hypoxic on arrival per RN. Upon assessment, patient found to be breathing comfortable on home settings.  Past Medical History:  Diagnosis Date  . Anxiety   . Diabetes mellitus without complication ([place])   . Dyslipidemia   . HTN (hypertension)   . Hyperlipemia   . Hypothyroid   . Hypothyroidism   . Lung cancer ([place]) 2000   Carcinoid, s/p RLL lobectomy  . Obesity   . Ovarian cancer ([place])   . Palpitations 2009 and 2006   Negative nuclear stress test   . Sleep apnea    on C-pap    Past Surgical History:  Procedure Laterality Date  . ABDOMINAL HYSTERECTOMY  2001  . GRADED EXERCISE TOLERANCE TEST  08/2015    Blood pressure demonstrated a hypertensive response to exercise.  There was less than 0.82mm of horizontal ST segment depression in the inferolateral leads. No diagnostic criterial for ischemia. Moderately impaired exercise tolerance. The patient achieved 5.8 mets. This is a low risk study.  . LOBECTOMY  2000   RLL  . NM MYOCAR PERF WALL MOTION  08/20/2007   EF 74%  EXERCISE CAPACITY 7 METS  . OOPHORECTOMY  2001  . TONSILLECTOMY    .  VIDEO BRONCHOSCOPY Bilateral 07/20/2017   Procedure: VIDEO BRONCHOSCOPY WITH FLUORO;  Surgeon: Collene Gobble, MD;  Location: [facility];  Service: Cardiopulmonary;  Laterality: Bilateral;    Allergies: Adhesive [tape]; Codeine; Lactose intolerance (gi); Monosodium glutamate; and Sulfa antibiotics  Medications: Prior to Admission medications   Medication Sig Start Date End Date Taking? Authorizing Provider  benzonatate (TESSALON) 100 MG capsule Take by mouth 3 (three) times daily as needed for cough.   Yes [provider]  budesonide-formoterol (SYMBICORT) 160-4.5 MCG/ACT inhaler Inhale 2 puffs into the lungs 2 (two) times daily. 07/03/17  Yes Noralee Space, MD  clorazepate (TRANXENE) 7.5 MG tablet Take 7.5 mg by mouth 2 (two) times daily as needed for anxiety.   Yes [provider]  folic acid (FOLVITE) 1 MG tablet Take 1 tablet (1 mg total) by mouth daily. 08/10/17  Yes Curcio, Roselie Awkward, NP  ipratropium-albuterol (DUONEB) 0.5-2.5 (3) MG/3ML SOLN Take 3 mLs by nebulization 3 (three) times daily. 07/06/17  Yes Noralee Space, MD  levothyroxine (SYNTHROID, LEVOTHROID) 112 MCG tablet Take 112 mcg by mouth daily before breakfast.   Yes [provider]  losartan-hydrochlorothiazide (HYZAAR) 50-12.5 MG per tablet Take 1 tablet by mouth every morning.  11/11/13  Yes [provider]  metoprolol succinate (TOPROL-XL) 50 MG 24 hr tablet Take 50 mg by mouth 2 (two) times daily.    Yes [provider]  predniSONE (DELTASONE) 10 MG tablet Take 1 tablet (10 mg total) by  mouth daily with breakfast. Alternate 1 tab one day and 1/2 tab the next Patient taking differently: Take 5-10 mg by mouth daily with breakfast. Alternate 10 mg tab one day and 5 mg tab the next 08/08/17  Yes Noralee Space, MD  rosuvastatin (CRESTOR) 10 MG tablet Take 10 mg by mouth every evening.    Yes [provider]  ibuprofen (ADVIL,MOTRIN) 200 MG tablet Take 200 mg by mouth every 6  (six) hours as needed (Back pain).     [provider]  lidocaine-prilocaine (EMLA) cream Apply 1 application topically as needed. 08/10/17   Maryanna Shape, NP  prochlorperazine (COMPAZINE) 10 MG tablet Take 1 tablet (10 mg total) by mouth every 6 (six) hours as needed for nausea or vomiting. 08/10/17   Curcio, Roselie Awkward, NP  valACYclovir (VALTREX) 1000 MG tablet Take 1,000 mg by mouth daily. 08/11/17   [provider]     Family History  Problem Relation Age of Onset  . Coronary artery disease Mother 42       died of an MI  . Heart attack Mother   . Suicidality Sister   . CVA Brother 16  . CVA Father     Social History   Socioeconomic History  . Marital status: Married    Spouse name: Not on file  . Number of children: 2  . Years of education: HS  . Highest education level: Not on file  Occupational History  . Not on file  Social Needs  . Financial resource strain: Not on file  . Food insecurity:    Worry: Not on file    Inability: Not on file  . Transportation needs:    Medical: Not on file    Non-medical: Not on file  Tobacco Use  . Smoking status: Former Smoker    Packs/day: 0.75    Years: 30.00    Pack years: 22.50    Types: Cigarettes    Last attempt to quit: 08/18/1991    Years since quitting: 26.0  . Smokeless tobacco: Never Used  Substance and Sexual Activity  . Alcohol use: No    Alcohol/week: 0.0 oz  . Drug use: No  . Sexual activity: Yes    Partners: Male  Lifestyle  . Physical activity:    Days per week: Not on file    Minutes per session: Not on file  . Stress: Not on file  Relationships  . Social connections:    Talks on phone: Not on file    Gets together: Not on file    Attends religious service: Not on file    Active member of club or organization: Not on file    Attends meetings of clubs or organizations: Not on file    Relationship status: Not on file  Other Topics Concern  . Not on file  Social History Narrative    1-2 glasses of tea a day    Married mother of 2 (daughter close to 36 and son 45-22 years old).  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     Review of Systems: A 12 point ROS discussed and pertinent positives are indicated in the HPI above.  All other systems are negative.  Review of Systems  Constitutional: Negative for activity change and fever.  Respiratory: Negative for shortness of breath and wheezing.   Cardiovascular: Negative for chest pain.  Gastrointestinal: Negative for abdominal pain.  Neurological:  Negative for dizziness.  Psychiatric/Behavioral: Negative for behavioral problems and confusion.    Vital Signs: BP (!) 158/65   Pulse 91   Temp 98.3 F (36.8 C) (Oral)   Resp (!) 22   SpO2 91% Comment: 4L at present.   Physical Exam  Constitutional: She is oriented to person, place, and time. She appears well-developed and well-nourished. No distress.  Cardiovascular: Normal rate, regular rhythm and normal heart sounds.  No murmur heard. Pulmonary/Chest: Effort normal. No respiratory distress. She has wheezes.  Neurological: She is alert and oriented to person, place, and time.  Skin: Skin is warm and dry.  Psychiatric: She has a normal mood and affect. Her behavior is normal. Judgment and thought content normal.  Nursing note and vitals reviewed.    MD Evaluation Airway: WNL Heart: WNL Abdomen: WNL Chest/ Lungs: WNL ASA  Classification: 3 Mallampati/Airway Score: Two   Imaging: Dg Chest 2 View  Result Date: 07/18/2017 CLINICAL DATA:  78 year old female with nausea vomiting. Fever. Prior right lung surgery for carcinoid. Cough since October 2018. EXAM: CHEST - 2 VIEW COMPARISON:  High-resolution chest CT 07/12/2017 and earlier. FINDINGS: Progressed upper and lower lung ground-glass opacity since 07/03/2017 radiographs, maximal in the right upper lobe. No consolidation. Findings are likely stable  from the recent CT. No superimposed pneumothorax or pleural effusion. Visualized tracheal air column is within normal limits. Mediastinal contours remain normal. No acute osseous abnormality identified. Paucity of bowel gas in the upper abdomen. IMPRESSION: 1. Abnormal bilateral pulmonary ground-glass opacity, maximal in the right upper lobe. No associated pleural effusion. 2. These lung findings are probably not significantly changed from the recent High-resolution Chest CT on 07/12/2017. The presence of fever would favor atypical infection, but please see that CT report regarding all differential diagnostic considerations. Electronically Signed   By: Genevie Ann M.D.   On: 07/18/2017 17:31   Mr Jeri Cos IW Contrast  Result Date: 08/09/2017 CLINICAL DATA:  Non-small cell lung cancer.  Staging. EXAM: MRI HEAD WITHOUT AND WITH CONTRAST TECHNIQUE: Multiplanar, multiecho pulse sequences of the brain and surrounding structures were obtained without and with intravenous contrast. CONTRAST:  80mL MULTIHANCE GADOBENATE DIMEGLUMINE 529 MG/ML IV SOLN COMPARISON:  None. FINDINGS: Brain: Diffusion imaging does not show any acute or subacute infarction. The brainstem and cerebellum are normal. Cerebral hemispheres show mild to moderate chronic small-vessel ischemic changes of the deep and subcortical white matter. No cortical or large vessel territory infarction. No primary or metastatic mass lesion, hemorrhage, hydrocephalus or extra-axial collection. No abnormal enhancement. Vascular: Major vessels at the base of the brain show flow. Skull and upper cervical spine: Negative Sinuses/Orbits: Clear/normal Other: None IMPRESSION: No evidence of metastatic disease. Mild to moderate chronic small-vessel ischemic changes of the cerebral hemispheric white matter. Electronically Signed   By: Nelson Chimes M.D.   On: 08/09/2017 10:21   Ct Abdomen Pelvis W Contrast  Result Date: 07/18/2017 CLINICAL DATA:  Nausea and vomiting.  Cough  and congestion. EXAM: CT ABDOMEN AND PELVIS WITH CONTRAST TECHNIQUE: Multidetector CT imaging of the abdomen and pelvis was performed using the standard protocol following bolus administration of intravenous contrast. CONTRAST:  139mL ISOVUE-300 IOPAMIDOL (ISOVUE-300) INJECTION 61% COMPARISON:  CT chest 07/12/2017. FINDINGS: Lower chest: Ground-glass attenuation throughout the lower lobes, LEFT greater than RIGHT, with consolidation, mildly increased from prior CT chest. Emphysematous changes. Hepatobiliary: No focal liver abnormality is seen. No gallstones, gallbladder wall thickening, or biliary dilatation. Pancreas: Unremarkable. No pancreatic ductal dilatation or surrounding inflammatory changes.  Spleen: Normal in size without focal abnormality. Adrenals/Urinary Tract: Adrenal glands are unremarkable. Kidneys are normal, without renal calculi, focal lesion, or hydronephrosis. Bladder is unremarkable. Stomach/Bowel: Stomach is within normal limits. No visible appendiceal inflammation, may be surgically absent. No evidence of bowel wall thickening, distention, or inflammatory changes. Vascular/Lymphatic: Aortic atherosclerosis. No enlarged abdominal or pelvic lymph nodes. Reproductive: Status post hysterectomy. No adnexal masses. Other: Small fat containing umbilical hernia. Musculoskeletal: No acute or significant osseous findings. IMPRESSION: No acute or worrisome findings in the abdomen and pelvis. No bowel obstruction, free fluid, or free air. Appendix not visualized, may be surgically absent. BILATERAL lower lobe opacities, greater on the LEFT, are mildly increased from CT chest several days ago. Correlate clinically for pneumonia. Aortic Atherosclerosis (ICD10-I70.0) and Emphysema (ICD10-J43.9). Electronically Signed   By: Staci Righter M.D.   On: 07/18/2017 20:20   Nm Pet Image Initial (pi) Skull Base To Thigh  Result Date: 08/09/2017 CLINICAL DATA:  Initial treatment strategy for lung cancer. EXAM:  NUCLEAR MEDICINE PET SKULL BASE TO THIGH TECHNIQUE: 10.5 mCi F-18 FDG was injected intravenously. Full-ring PET imaging was performed from the skull base to thigh after the radiotracer. CT data was obtained and used for attenuation correction and anatomic localization. Fasting blood glucose: 117 mg/dl COMPARISON:  CT abdomen pelvis 07/18/2017 and CT chest 07/12/2017. FINDINGS: Mediastinal blood pool activity: SUV max 2.1 NECK: Focal hypermetabolism is seen in the anterior hypopharynx without a CT correlate. No hypermetabolic lymph nodes. Incidental CT findings: None. CHEST: No hypermetabolic mediastinal, hilar or axillary lymph nodes. There is patchy ground-glass and consolidation in the right upper lobe with additional patchy ground-glass in lower lobes bilaterally, with low level associated hypermetabolism. No hypermetabolic pulmonary nodules. Incidental CT findings: Atherosclerotic calcification of the arterial vasculature. No pericardial or pleural effusion. ABDOMEN/PELVIS: No abnormal hypermetabolism in the liver, adrenal glands, spleen or pancreas. No hypermetabolic lymph nodes. Incidental CT findings: Atherosclerotic calcification of the arterial vasculature without abdominal aortic aneurysm. SKELETON: No abnormal osseous hypermetabolism. Incidental CT findings: Degenerative changes in the spine. IMPRESSION: 1. Borderline hypermetabolic right upper lobe consolidation and ground-glass with hypometabolic patchy ground-glass in both lower lobes. Findings may be infectious or inflammatory in etiology. Difficult to definitively exclude low-grade adenocarcinoma in the right upper lobe. 2. Focal hypermetabolism along the ventral aspect of the hypopharynx without a CT correlate. 3.  Aortic atherosclerosis (ICD10-170.0). Electronically Signed   By: Lorin Picket M.D.   On: 08/09/2017 10:46   Dg Chest Port 1 View  Result Date: 07/20/2017 CLINICAL DATA:  Status post bronchoscopy EXAM: PORTABLE CHEST 1 VIEW  COMPARISON:  Two days ago FINDINGS: Increased airspace opacity in the right upper lobe. Low volume chest after the procedure. No evidence of air leak. Scarring or atelectasis at the right base. Normal heart size. Artifact from EKG leads. IMPRESSION: Increased right upper lobe opacity after bronchoscopy. No evidence of air leak. Electronically Signed   By: Monte Fantasia M.D.   On: 07/20/2017 08:25   Dg C-arm Bronchoscopy  Result Date: 07/20/2017 C-ARM BRONCHOSCOPY: Fluoroscopy was utilized by the requesting physician.  No radiographic interpretation.    Labs:  CBC: Recent Labs    07/20/17 0542 07/21/17 0550 07/31/17 1352 08/12/17 0900  WBC 23.2* 12.7* 19.2* 10.8*  HGB 14.5 14.5 16.3* 17.6*  HCT 44.2 45.5 51.1* 56.0*  PLT 245 231 277 269    COAGS: No results for input(s): INR, APTT in the last 8760 hours.  BMP: Recent Labs    07/20/17 0542 07/21/17  0550 07/31/17 1352 08/12/17 0900  NA 140 142 142 144  K 3.8 3.5 3.7 3.5  CL 107 104 100 100*  CO2 25 28 33* 32  GLUCOSE 153* 97 101 104*  BUN 11 10 13 8   CALCIUM 8.4* 8.4* 9.7 9.5  CREATININE 0.70 0.64 0.79 0.86  GFRNONAA >60 >60 >60 >60  GFRAA >60 >60 >60 >60    LIVER FUNCTION TESTS: Recent Labs    07/18/17 1650 07/20/17 0542 07/31/17 1352 08/12/17 0900  BILITOT 1.7* 0.3 0.9 0.9  AST 15 15 14 22   ALT 12* 11* 17 20  ALKPHOS 48 38 52 52  PROT 6.5 5.6* 6.8 6.4*  ALBUMIN 3.3* 2.6* 3.5 3.4*    TUMOR MARKERS: No results for input(s): AFPTM, CEA, CA199, CHROMGRNA in the last 8760 hours.  Assessment and Plan:  Adenocarcinoma of right lung in need of chemotherapy. Plan for image-guided Port-a-cath insertion today with Dr. Pascal Lux. Patient is NPO. Denies fever. INR pending. Will obtain lab work and continue to monitor respiratory function.  Risks and benefits of image guided port-a-catheter placement was discussed with the patient including, but not limited to bleeding, infection, pneumothorax, or fibrin sheath  development and need for additional procedures. All of the patient's questions were answered, patient is agreeable to proceed. Consent signed and in chart.  Thank you for this interesting consult.  I greatly enjoyed meeting Erin Myers and look forward to participating in their care.  A copy of this report was sent to the requesting provider on this date.  Electronically Signed: Earley Abide, PA-C 08/17/2017, 12:14 PM   I spent a total of 15 Minutes in face to face in clinical consultation, greater than 50% of which was counseling/coordinating care for adenocarcinoma of right lung in need of chemotherapy.

## 2017-08-17 NOTE — Procedures (Signed)
Pre Procedure Dx: Lung Cancer Post Procedural Dx: Same  Successful placement of right IJ approach port-a-cath with tip at the superior caval atrial junction. The catheter is ready for immediate use.  Estimated Blood Loss: Minimal  Complications: None immediate.  Jay Treon Kehl, MD Pager #: [phone]   

## 2017-08-18 ENCOUNTER — Encounter: Payer: Self-pay | Admitting: Internal Medicine

## 2017-08-18 ENCOUNTER — Inpatient Hospital Stay: Payer: PPO

## 2017-08-18 VITALS — BP 149/47 | HR 88 | Temp 98.9°F | Resp 20

## 2017-08-18 DIAGNOSIS — C3491 Malignant neoplasm of unspecified part of right bronchus or lung: Secondary | ICD-10-CM

## 2017-08-18 DIAGNOSIS — Z5111 Encounter for antineoplastic chemotherapy: Secondary | ICD-10-CM | POA: Diagnosis not present

## 2017-08-18 DIAGNOSIS — Z5112 Encounter for antineoplastic immunotherapy: Secondary | ICD-10-CM

## 2017-08-18 DIAGNOSIS — R0602 Shortness of breath: Secondary | ICD-10-CM

## 2017-08-18 DIAGNOSIS — Z95828 Presence of other vascular implants and grafts: Secondary | ICD-10-CM | POA: Insufficient documentation

## 2017-08-18 LAB — CBC WITH DIFFERENTIAL (CANCER CENTER ONLY)
BASOS PCT: 0 %
Basophils Absolute: 0 10*3/uL (ref 0.0–0.1)
EOS ABS: 0.2 10*3/uL (ref 0.0–0.5)
EOS PCT: 1 %
HCT: 49.9 % — ABNORMAL HIGH (ref 34.8–46.6)
HEMOGLOBIN: 15.6 g/dL (ref 11.6–15.9)
LYMPHS ABS: 1.6 10*3/uL (ref 0.9–3.3)
Lymphocytes Relative: 11 %
MCH: 30.5 pg (ref 25.1–34.0)
MCHC: 31.3 g/dL — ABNORMAL LOW (ref 31.5–36.0)
MCV: 97.7 fL (ref 79.5–101.0)
Monocytes Absolute: 0.9 10*3/uL (ref 0.1–0.9)
Monocytes Relative: 6 %
NEUTROS PCT: 82 %
Neutro Abs: 11.4 10*3/uL — ABNORMAL HIGH (ref 1.5–6.5)
PLATELETS: 206 10*3/uL (ref 145–400)
RBC: 5.11 MIL/uL (ref 3.70–5.45)
RDW: 15.9 % — ABNORMAL HIGH (ref 11.2–14.5)
WBC Count: 14 10*3/uL — ABNORMAL HIGH (ref 3.9–10.3)

## 2017-08-18 LAB — CMP (CANCER CENTER ONLY)
ALK PHOS: 50 U/L (ref 40–150)
ALT: 8 U/L (ref 0–55)
AST: 11 U/L (ref 5–34)
Albumin: 3.1 g/dL — ABNORMAL LOW (ref 3.5–5.0)
Anion gap: 10 (ref 3–11)
BUN: 10 mg/dL (ref 7–26)
CALCIUM: 9.1 mg/dL (ref 8.4–10.4)
CHLORIDE: 102 mmol/L (ref 98–109)
CO2: 31 mmol/L — ABNORMAL HIGH (ref 22–29)
CREATININE: 0.8 mg/dL (ref 0.60–1.10)
Glucose, Bld: 141 mg/dL — ABNORMAL HIGH (ref 70–140)
Potassium: 3.6 mmol/L (ref 3.5–5.1)
Sodium: 143 mmol/L (ref 136–145)
Total Bilirubin: 0.8 mg/dL (ref 0.2–1.2)
Total Protein: 6.5 g/dL (ref 6.4–8.3)

## 2017-08-18 LAB — TSH: TSH: 0.64 u[IU]/mL (ref 0.308–3.960)

## 2017-08-18 MED ORDER — SODIUM CHLORIDE 0.9 % IV SOLN
489.5000 mg | Freq: Once | INTRAVENOUS | Status: AC
  Administered 2017-08-18: 490 mg via INTRAVENOUS
  Filled 2017-08-18: qty 49

## 2017-08-18 MED ORDER — PALONOSETRON HCL INJECTION 0.25 MG/5ML
INTRAVENOUS | Status: AC
  Filled 2017-08-18: qty 5

## 2017-08-18 MED ORDER — SODIUM CHLORIDE 0.9 % IV SOLN
Freq: Once | INTRAVENOUS | Status: AC
  Administered 2017-08-18: 11:00:00 via INTRAVENOUS

## 2017-08-18 MED ORDER — SODIUM CHLORIDE 0.9% FLUSH
10.0000 mL | INTRAVENOUS | Status: DC | PRN
  Administered 2017-08-18: 10 mL
  Filled 2017-08-18: qty 10

## 2017-08-18 MED ORDER — PALONOSETRON HCL INJECTION 0.25 MG/5ML
0.2500 mg | Freq: Once | INTRAVENOUS | Status: AC
  Administered 2017-08-18: 0.25 mg via INTRAVENOUS

## 2017-08-18 MED ORDER — ALBUTEROL SULFATE (2.5 MG/3ML) 0.083% IN NEBU
2.5000 mg | INHALATION_SOLUTION | Freq: Once | RESPIRATORY_TRACT | Status: AC
  Administered 2017-08-18: 2.5 mg via RESPIRATORY_TRACT
  Filled 2017-08-18: qty 3

## 2017-08-18 MED ORDER — SODIUM CHLORIDE 0.9 % IV SOLN
200.0000 mg | Freq: Once | INTRAVENOUS | Status: AC
  Administered 2017-08-18: 200 mg via INTRAVENOUS
  Filled 2017-08-18: qty 8

## 2017-08-18 MED ORDER — FOSAPREPITANT DIMEGLUMINE INJECTION 150 MG
Freq: Once | INTRAVENOUS | Status: AC
  Administered 2017-08-18: 11:00:00 via INTRAVENOUS
  Filled 2017-08-18: qty 5

## 2017-08-18 MED ORDER — SODIUM CHLORIDE 0.9 % IV SOLN
485.0000 mg/m2 | Freq: Once | INTRAVENOUS | Status: AC
  Administered 2017-08-18: 1000 mg via INTRAVENOUS
  Filled 2017-08-18: qty 40

## 2017-08-18 MED ORDER — HEPARIN SOD (PORK) LOCK FLUSH 100 UNIT/ML IV SOLN
500.0000 [IU] | Freq: Once | INTRAVENOUS | Status: AC | PRN
  Administered 2017-08-18: 500 [IU]
  Filled 2017-08-18: qty 5

## 2017-08-18 NOTE — Patient Instructions (Signed)
[place] [facility] Discharge Instructions for Patients Receiving Chemotherapy  Today you received the following chemotherapy agents: Keytruda, Alimta, and Carboplatin.  To help prevent nausea and vomiting after your treatment, we encourage you to take your nausea medication as prescribed.   If you develop nausea and vomiting that is not controlled by your nausea medication, call the clinic.   BELOW ARE SYMPTOMS THAT SHOULD BE REPORTED IMMEDIATELY:  *FEVER GREATER THAN 100.5 F  *CHILLS WITH OR WITHOUT FEVER  NAUSEA AND VOMITING THAT IS NOT CONTROLLED WITH YOUR NAUSEA MEDICATION  *UNUSUAL SHORTNESS OF BREATH  *UNUSUAL BRUISING OR BLEEDING  TENDERNESS IN MOUTH AND THROAT WITH OR WITHOUT PRESENCE OF ULCERS  *URINARY PROBLEMS  *BOWEL PROBLEMS  UNUSUAL RASH Items with * indicate a potential emergency and should be followed up as soon as possible.  Feel free to call the clinic should you have any questions or concerns. The clinic phone number is [phone].  Please show the [place] at check-in to the Emergency Department and triage nurse.

## 2017-08-18 NOTE — Progress Notes (Signed)
Met w/ pt to introduce myself as her Arboriculturist.  Unfortunately there aren't any foundations offering copay assistance for her Dx and the type of ins she has.  I offered the [place], went over what it covers and gave her the income requirement.  She stated she exceeds it so she won't qualify for the grant.  She has my card for any questions or concerns she may have in the future.

## 2017-08-21 ENCOUNTER — Telehealth: Payer: Self-pay | Admitting: *Deleted

## 2017-08-22 DIAGNOSIS — G4733 Obstructive sleep apnea (adult) (pediatric): Secondary | ICD-10-CM | POA: Diagnosis not present

## 2017-08-22 LAB — FUNGAL ORGANISM REFLEX

## 2017-08-22 LAB — FUNGUS CULTURE WITH STAIN

## 2017-08-22 LAB — FUNGUS CULTURE RESULT

## 2017-08-24 ENCOUNTER — Inpatient Hospital Stay: Payer: PPO

## 2017-08-24 ENCOUNTER — Other Ambulatory Visit: Payer: PPO

## 2017-08-24 ENCOUNTER — Encounter: Payer: Self-pay | Admitting: Oncology

## 2017-08-24 ENCOUNTER — Other Ambulatory Visit: Payer: Self-pay

## 2017-08-24 ENCOUNTER — Telehealth: Payer: Self-pay | Admitting: Oncology

## 2017-08-24 ENCOUNTER — Inpatient Hospital Stay (HOSPITAL_BASED_OUTPATIENT_CLINIC_OR_DEPARTMENT_OTHER): Payer: PPO | Admitting: Oncology

## 2017-08-24 VITALS — BP 111/46 | HR 86 | Temp 98.8°F | Resp 18 | Ht 64.75 in | Wt 205.2 lb

## 2017-08-24 DIAGNOSIS — F419 Anxiety disorder, unspecified: Secondary | ICD-10-CM

## 2017-08-24 DIAGNOSIS — J439 Emphysema, unspecified: Secondary | ICD-10-CM

## 2017-08-24 DIAGNOSIS — C3491 Malignant neoplasm of unspecified part of right bronchus or lung: Secondary | ICD-10-CM

## 2017-08-24 DIAGNOSIS — E039 Hypothyroidism, unspecified: Secondary | ICD-10-CM

## 2017-08-24 DIAGNOSIS — I700 Atherosclerosis of aorta: Secondary | ICD-10-CM

## 2017-08-24 DIAGNOSIS — E785 Hyperlipidemia, unspecified: Secondary | ICD-10-CM | POA: Diagnosis not present

## 2017-08-24 DIAGNOSIS — G4730 Sleep apnea, unspecified: Secondary | ICD-10-CM | POA: Diagnosis not present

## 2017-08-24 DIAGNOSIS — Z8542 Personal history of malignant neoplasm of other parts of uterus: Secondary | ICD-10-CM

## 2017-08-24 DIAGNOSIS — Z95828 Presence of other vascular implants and grafts: Secondary | ICD-10-CM

## 2017-08-24 DIAGNOSIS — R059 Cough, unspecified: Secondary | ICD-10-CM

## 2017-08-24 DIAGNOSIS — I10 Essential (primary) hypertension: Secondary | ICD-10-CM | POA: Diagnosis not present

## 2017-08-24 DIAGNOSIS — Z5112 Encounter for antineoplastic immunotherapy: Secondary | ICD-10-CM

## 2017-08-24 DIAGNOSIS — C7802 Secondary malignant neoplasm of left lung: Secondary | ICD-10-CM

## 2017-08-24 DIAGNOSIS — E119 Type 2 diabetes mellitus without complications: Secondary | ICD-10-CM | POA: Diagnosis not present

## 2017-08-24 DIAGNOSIS — C7801 Secondary malignant neoplasm of right lung: Secondary | ICD-10-CM

## 2017-08-24 DIAGNOSIS — C3411 Malignant neoplasm of upper lobe, right bronchus or lung: Secondary | ICD-10-CM

## 2017-08-24 DIAGNOSIS — K5900 Constipation, unspecified: Secondary | ICD-10-CM

## 2017-08-24 DIAGNOSIS — Z5111 Encounter for antineoplastic chemotherapy: Secondary | ICD-10-CM

## 2017-08-24 DIAGNOSIS — R05 Cough: Secondary | ICD-10-CM

## 2017-08-24 LAB — CMP (CANCER CENTER ONLY)
ALK PHOS: 48 U/L (ref 40–150)
ALT: 11 U/L (ref 0–55)
AST: 14 U/L (ref 5–34)
Albumin: 3 g/dL — ABNORMAL LOW (ref 3.5–5.0)
Anion gap: 10 (ref 3–11)
BUN: 20 mg/dL (ref 7–26)
CALCIUM: 9.4 mg/dL (ref 8.4–10.4)
CHLORIDE: 95 mmol/L — AB (ref 98–109)
CO2: 34 mmol/L — ABNORMAL HIGH (ref 22–29)
CREATININE: 0.71 mg/dL (ref 0.60–1.10)
GFR, Estimated: >60 mL/min (ref >60)
Glucose, Bld: 123 mg/dL (ref 70–140)
Potassium: 3.5 mmol/L (ref 3.5–5.1)
Sodium: 139 mmol/L (ref 136–145)
TOTAL PROTEIN: 6.2 g/dL — AB (ref 6.4–8.3)
Total Bilirubin: 0.7 mg/dL (ref 0.2–1.2)

## 2017-08-24 LAB — CBC WITH DIFFERENTIAL (CANCER CENTER ONLY)
Basophils Absolute: 0 10*3/uL (ref 0.0–0.1)
Basophils Relative: 0 %
EOS PCT: 2 %
Eosinophils Absolute: 0.2 10*3/uL (ref 0.0–0.5)
HCT: 48.2 % — ABNORMAL HIGH (ref 34.8–46.6)
Hemoglobin: 15.1 g/dL (ref 11.6–15.9)
LYMPHS ABS: 1.2 10*3/uL (ref 0.9–3.3)
Lymphocytes Relative: 17 %
MCH: 30.3 pg (ref 25.1–34.0)
MCHC: 31.3 g/dL — ABNORMAL LOW (ref 31.5–36.0)
MCV: 96.8 fL (ref 79.5–101.0)
MONO ABS: 0.1 10*3/uL (ref 0.1–0.9)
Monocytes Relative: 2 %
Neutro Abs: 5.6 10*3/uL (ref 1.5–6.5)
Neutrophils Relative %: 79 %
PLATELETS: 146 10*3/uL (ref 145–400)
RBC: 4.98 MIL/uL (ref 3.70–5.45)
RDW: 15.3 % — ABNORMAL HIGH (ref 11.2–14.5)
WBC: 7 10*3/uL (ref 3.9–10.3)

## 2017-08-24 MED ORDER — SODIUM CHLORIDE 0.9% FLUSH
10.0000 mL | INTRAVENOUS | Status: DC | PRN
  Administered 2017-08-24: 10 mL
  Filled 2017-08-24: qty 10

## 2017-08-24 MED ORDER — HEPARIN SOD (PORK) LOCK FLUSH 100 UNIT/ML IV SOLN
500.0000 [IU] | Freq: Once | INTRAVENOUS | Status: AC | PRN
Start: 2017-08-24 — End: 2017-08-24
  Administered 2017-08-24: 500 [IU]
  Filled 2017-08-24: qty 5

## 2017-08-24 NOTE — Telephone Encounter (Signed)
Pt already scheduled next 3 cycles. Per 6/13 los.

## 2017-08-24 NOTE — Assessment & Plan Note (Addendum)
This is a very pleasant 77 year old white female with stage IV (T3, N0, M1 a)non-small cell lung cancer, well-differentiated adenocarcinoma presented with multifocal disease involving mainly the right upper lobe as well as the right middle lobe and left lower lobe of the lung diagnosed in May 2019. The patient is currently on treatment with carboplatin for an AUC of 5, Alimta 500 mg meter squared, and Keytruda 200 mg IV every 3 weeks.    She tolerated the first cycle well with the exception of fatigue and constipation. Labs were reviewed with patient which are remain stable.  For constipation, I have advised her to take a stool softener twice a day along with MiraLAX 1 capful mixed in water daily.  For her cough, I recommend that she try Mucinex.  The patient will follow-up in 2 weeks for evaluation prior to cycle 2 of her treatment.  She will continue to have weekly labs while receiving her treatment.  She was advised to call immediately if she has any concerning symptoms in the interval.  The patient voices understanding of current disease status and treatment options and is in agreement with the current care plan.  All questions were answered. The patient knows to call the clinic with any problems, questions or concerns. We can certainly see the patient much sooner if necessary.

## 2017-08-24 NOTE — Progress Notes (Signed)
[facility] OFFICE PROGRESS NOTE  [name], MD [place] [address]  DIAGNOSIS: highly suspicious stage IV (T3, N0, M1 a)non-small cell lung cancer, well-differentiated adenocarcinoma presented with multifocal disease involving mainly the right upper lobe as well as the right middle lobe and left lower lobe of the lung diagnosed in May 2019.  Guardant 360 testing was negative for actionable mutations.  PRIOR THERAPY: None  CURRENT THERAPY: Carboplatin for an AUC of 5, Alimta 500 mg meter squared, and Keytruda 200 mg IV given every 3 weeks.  First dose given on 08/18/2017.  Status post 1 cycle.  INTERVAL HISTORY: Erin Myers 77 y.o. female returns for routine follow-up visit accompanied by her husband.  The patient reports that she is feeling fine with the exception of fatigue.  She tolerated her first cycle of chemotherapy well overall.  She denies fevers and chills.  Denies chest pain and hemoptysis.  She has her baseline cough and shortness of breath with exertion.  She wears O2 at 4 L/min.  Denies nausea and vomiting.  Denies diarrhea.  She has had some constipation.  Reports that her appetite remains good and she has not lost any weight since her last visit.  Patient is here for evaluation and repeat lab work.  MEDICAL HISTORY: Past Medical History:  Diagnosis Date  . Anxiety   . Diabetes mellitus without complication ([facility])   . Dyslipidemia   . HTN (hypertension)   . Hyperlipemia   . Hypothyroid   . Hypothyroidism   . Lung cancer ([facility]) 2000   Carcinoid, s/p RLL lobectomy  . Obesity   . Ovarian cancer ([facility])   . Palpitations 2009 and 2006   Negative nuclear stress test   . Sleep apnea    on C-pap    ALLERGIES:  is allergic to adhesive [tape]; codeine; lactose intolerance (gi); monosodium glutamate; and sulfa antibiotics.  MEDICATIONS:  Current Outpatient Medications  Medication Sig Dispense Refill  . acetaminophen (TYLENOL)  500 MG tablet Take 500 mg by mouth every 6 (six) hours as needed.    . benzonatate (TESSALON) 100 MG capsule Take by mouth 3 (three) times daily as needed for cough.    . budesonide-formoterol (SYMBICORT) 160-4.5 MCG/ACT inhaler Inhale 2 puffs into the lungs 2 (two) times daily. 1 Inhaler 6  . clorazepate (TRANXENE) 7.5 MG tablet Take 7.5 mg by mouth 2 (two) times daily as needed for anxiety.    . folic acid (FOLVITE) 1 MG tablet Take 1 tablet (1 mg total) by mouth daily. 30 tablet 2  . ipratropium-albuterol (DUONEB) 0.5-2.5 (3) MG/3ML SOLN Take 3 mLs by nebulization 3 (three) times daily. 360 mL 2  . levothyroxine (SYNTHROID, LEVOTHROID) 112 MCG tablet Take 112 mcg by mouth daily before breakfast.    . losartan-hydrochlorothiazide (HYZAAR) 50-12.5 MG per tablet Take 1 tablet by mouth every morning.     . metoprolol succinate (TOPROL-XL) 50 MG 24 hr tablet Take 50 mg by mouth 2 (two) times daily.     . predniSONE (DELTASONE) 10 MG tablet Take 1 tablet (10 mg total) by mouth daily with breakfast. Alternate 1 tab one day and 1/2 tab the next (Patient taking differently: Take 5-10 mg by mouth daily with breakfast. Alternate 10 mg tab one day and 5 mg tab the next) 30 tablet 0  . rosuvastatin (CRESTOR) 10 MG tablet Take 10 mg by mouth every evening.     . lidocaine-prilocaine (EMLA) cream Apply 1 application topically as  needed. (Patient not taking: Reported on 08/24/2017) 30 g 0  . prochlorperazine (COMPAZINE) 10 MG tablet Take 1 tablet (10 mg total) by mouth every 6 (six) hours as needed for nausea or vomiting. (Patient not taking: Reported on 08/24/2017) 30 tablet 0  . valACYclovir (VALTREX) 1000 MG tablet Take 1,000 mg by mouth daily.  0   No current facility-administered medications for this visit.     SURGICAL HISTORY:  Past Surgical History:  Procedure Laterality Date  . ABDOMINAL HYSTERECTOMY  2001  . GRADED EXERCISE TOLERANCE TEST  08/2015    Blood pressure demonstrated a hypertensive  response to exercise.  There was less than 0.48m of horizontal ST segment depression in the inferolateral leads. No diagnostic criterial for ischemia. Moderately impaired exercise tolerance. The patient achieved 5.8 mets. This is a low risk study.  . IR FLUORO GUIDE PORT INSERTION RIGHT  08/17/2017  . IR [place] VASC ACCESS RIGHT  08/17/2017  . LOBECTOMY  2000   RLL  . NM MYOCAR PERF WALL MOTION  08/20/2007   EF 74%  EXERCISE CAPACITY 7 METS  . OOPHORECTOMY  2001  . TONSILLECTOMY    . VIDEO BRONCHOSCOPY Bilateral 07/20/2017   Procedure: VIDEO BRONCHOSCOPY WITH FLUORO;  Surgeon: BCollene Gobble MD;  [facility];  Service: Cardiopulmonary;  Laterality: Bilateral;    REVIEW OF SYSTEMS:   Review of Systems  Constitutional: Negative for appetite change, chills, fever and unexpected weight change. Positive for fatigue. HENT:   Negative for mouth sores, nosebleeds, sore throat and trouble swallowing.   Eyes: Negative for eye problems and icterus.  Respiratory: Negative for hemoptysis and wheezing.  Positive for cough and baseline shortness of breath with exertion.  She wears home oxygen. Cardiovascular: Negative for chest pain and leg swelling.  Gastrointestinal: Negative for abdominal pain, diarrhea, nausea and vomiting. Positive for constipation. Genitourinary: Negative for bladder incontinence, difficulty urinating, dysuria, frequency and hematuria.   Musculoskeletal: Negative for back pain, gait problem, neck pain and neck stiffness.  Skin: Negative for itching and rash.  Neurological: Negative for dizziness, extremity weakness, gait problem, headaches, light-headedness and seizures.  Hematological: Negative for adenopathy. Does not bruise/bleed easily.  Psychiatric/Behavioral: Negative for confusion, depression and sleep disturbance. The patient is not nervous/anxious.     PHYSICAL EXAMINATION:  Blood pressure (!) 111/46, pulse 86, temperature 98.8 F (37.1 C), temperature source  Oral, resp. rate 18, height 5' 4.75" (1.645 m), weight 205 lb 3.2 oz (93.1 kg), SpO2 91 %.  ECOG PERFORMANCE STATUS: 1 - Symptomatic but completely ambulatory  Physical Exam  Constitutional: Oriented to person, place, and time and well-developed, well-nourished, and in no distress. No distress.  HENT:  Head: Normocephalic and atraumatic.  Mouth/Throat: Oropharynx is clear and moist. No oropharyngeal exudate.  Eyes: Conjunctivae are normal. Right eye exhibits no discharge. Left eye exhibits no discharge. No scleral icterus.  Neck: Normal range of motion. Neck supple.  Cardiovascular: Normal rate, regular rhythm, normal heart sounds and intact distal pulses.   Pulmonary/Chest: Effort normal. No respiratory distress. No wheezes. No rales. Scattered rhonchi which clear with coughing. Abdominal: Soft. Bowel sounds are normal. Exhibits no distension and no mass. There is no tenderness.  Musculoskeletal: Normal range of motion. Exhibits no edema.  Lymphadenopathy:    No cervical adenopathy.  Neurological: Alert and oriented to person, place, and time. Exhibits normal muscle tone. Gait normal. Coordination normal.  Skin: Skin is warm and dry. No rash noted. Not diaphoretic. No erythema. No pallor.  Psychiatric: Mood, memory and judgment normal.  Vitals reviewed.  LABORATORY DATA: Lab Results  Component Value Date   WBC 7.0 08/24/2017   HGB 15.1 08/24/2017   HCT 48.2 (H) 08/24/2017   MCV 96.8 08/24/2017   PLT 146 08/24/2017      Chemistry      Component Value Date/Time   NA 139 08/24/2017 0829   K 3.5 08/24/2017 0829   CL 95 (L) 08/24/2017 0829   CO2 34 (H) 08/24/2017 0829   BUN 20 08/24/2017 0829   CREATININE 0.71 08/24/2017 0829      Component Value Date/Time   CALCIUM 9.4 08/24/2017 0829   ALKPHOS 48 08/24/2017 0829   AST 14 08/24/2017 0829   ALT 11 08/24/2017 0829   BILITOT 0.7 08/24/2017 0829       RADIOGRAPHIC STUDIES:  Mr Jeri Cos ZC Contrast  Result Date:  08/09/2017 CLINICAL DATA:  Non-small cell lung cancer.  Staging. EXAM: MRI HEAD WITHOUT AND WITH CONTRAST TECHNIQUE: Multiplanar, multiecho pulse sequences of the brain and surrounding structures were obtained without and with intravenous contrast. CONTRAST:  49m MULTIHANCE GADOBENATE DIMEGLUMINE 529 MG/ML IV SOLN COMPARISON:  None. FINDINGS: Brain: Diffusion imaging does not show any acute or subacute infarction. The brainstem and cerebellum are normal. Cerebral hemispheres show mild to moderate chronic small-vessel ischemic changes of the deep and subcortical white matter. No cortical or large vessel territory infarction. No primary or metastatic mass lesion, hemorrhage, hydrocephalus or extra-axial collection. No abnormal enhancement. Vascular: Major vessels at the base of the brain show flow. Skull and upper cervical spine: Negative Sinuses/Orbits: Clear/normal Other: None IMPRESSION: No evidence of metastatic disease. Mild to moderate chronic small-vessel ischemic changes of the cerebral hemispheric white matter. Electronically Signed   By: MNelson ChimesM.D.   On: 08/09/2017 10:21   Nm Pet Image Initial (pi) Skull Base To Thigh  Result Date: 08/09/2017 CLINICAL DATA:  Initial treatment strategy for lung cancer. EXAM: NUCLEAR MEDICINE PET SKULL BASE TO THIGH TECHNIQUE: 10.5 mCi F-18 FDG was injected intravenously. Full-ring PET imaging was performed from the skull base to thigh after the radiotracer. CT data was obtained and used for attenuation correction and anatomic localization. Fasting blood glucose: 117 mg/dl COMPARISON:  CT abdomen pelvis 07/18/2017 and CT chest 07/12/2017. FINDINGS: Mediastinal blood pool activity: SUV max 2.1 NECK: Focal hypermetabolism is seen in the anterior hypopharynx without a CT correlate. No hypermetabolic lymph nodes. Incidental CT findings: None. CHEST: No hypermetabolic mediastinal, hilar or axillary lymph nodes. There is patchy ground-glass and consolidation in the right  upper lobe with additional patchy ground-glass in lower lobes bilaterally, with low level associated hypermetabolism. No hypermetabolic pulmonary nodules. Incidental CT findings: Atherosclerotic calcification of the arterial vasculature. No pericardial or pleural effusion. ABDOMEN/PELVIS: No abnormal hypermetabolism in the liver, adrenal glands, spleen or pancreas. No hypermetabolic lymph nodes. Incidental CT findings: Atherosclerotic calcification of the arterial vasculature without abdominal aortic aneurysm. SKELETON: No abnormal osseous hypermetabolism. Incidental CT findings: Degenerative changes in the spine. IMPRESSION: 1. Borderline hypermetabolic right upper lobe consolidation and ground-glass with hypometabolic patchy ground-glass in both lower lobes. Findings may be infectious or inflammatory in etiology. Difficult to definitively exclude low-grade adenocarcinoma in the right upper lobe. 2. Focal hypermetabolism along the ventral aspect of the hypopharynx without a CT correlate. 3.  Aortic atherosclerosis (ICD10-170.0). Electronically Signed   By: MLorin PicketM.D.   On: 08/09/2017 10:46   Ir [place] Vasc Access Right  Result Date: 08/17/2017 INDICATION: History of multifocal  lung cancer. In need of durable intravenous for chemotherapy administration. EXAM: IMPLANTED PORT A CATH PLACEMENT WITH ULTRASOUND AND FLUOROSCOPIC GUIDANCE COMPARISON:  PET CT - 08/09/2017; chest CT  -07/22/2017 MEDICATIONS: Ancef 2 gm IV; The antibiotic was administered within an appropriate time interval prior to skin puncture. ANESTHESIA/SEDATION: Moderate (conscious) sedation was employed during this procedure. A total of Versed 2 mg and Fentanyl 100 mcg was administered intravenously. Moderate Sedation Time: 28 minutes. The patient's level of consciousness and vital signs were monitored continuously by radiology nursing throughout the procedure under my direct supervision. CONTRAST:  None FLUOROSCOPY TIME:  24 seconds (6  mGy) COMPLICATIONS: None immediate. PROCEDURE: The procedure, risks, benefits, and alternatives were explained to the patient. Questions regarding the procedure were encouraged and answered. The patient understands and consents to the procedure. The right neck and chest were prepped with chlorhexidine in a sterile fashion, and a sterile drape was applied covering the operative field. Maximum barrier sterile technique with sterile gowns and gloves were used for the procedure. A timeout was performed prior to the initiation of the procedure. Local anesthesia was provided with 1% lidocaine with epinephrine. After creating a small venotomy incision, a micropuncture kit was utilized to access the internal jugular vein. Real-time ultrasound guidance was utilized for vascular access including the acquisition of a permanent ultrasound image documenting patency of the accessed vessel. The microwire was utilized to measure appropriate catheter length. A subcutaneous port pocket was then created along the upper chest wall utilizing a combination of sharp and blunt dissection. The pocket was irrigated with sterile saline. A single lumen ISP power injectable port was chosen for placement. The 8 Fr catheter was tunneled from the port pocket site to the venotomy incision. The port was placed in the pocket. The external catheter was trimmed to appropriate length. At the venotomy, an 8 Fr peel-away sheath was placed over a guidewire under fluoroscopic guidance. The catheter was then placed through the sheath and the sheath was removed. Final catheter positioning was confirmed and documented with a fluoroscopic spot radiograph. The port was accessed with a Huber needle, aspirated and flushed with heparinized saline. The venotomy site was closed with an interrupted 4-0 Vicryl suture. The port pocket incision was closed with interrupted 2-0 Vicryl suture and the skin was opposed with a running subcuticular 4-0 Vicryl suture. Dermabond  and Steri-strips were applied to both incisions. Dressings were placed. The patient tolerated the procedure well without immediate post procedural complication. FINDINGS: After catheter placement, the tip lies within the superior cavoatrial junction. The catheter aspirates and flushes normally and is ready for immediate use. IMPRESSION: Successful placement of a right internal jugular approach power injectable Port-A-Cath. The catheter is ready for immediate use. Electronically Signed   By: Sandi Mariscal M.D.   On: 08/17/2017 14:56   Ir Fluoro Guide Port Insertion Right  Result Date: 08/17/2017 INDICATION: History of multifocal lung cancer. In need of durable intravenous for chemotherapy administration. EXAM: IMPLANTED PORT A CATH PLACEMENT WITH ULTRASOUND AND FLUOROSCOPIC GUIDANCE COMPARISON:  PET CT - 08/09/2017; chest CT  -07/22/2017 MEDICATIONS: Ancef 2 gm IV; The antibiotic was administered within an appropriate time interval prior to skin puncture. ANESTHESIA/SEDATION: Moderate (conscious) sedation was employed during this procedure. A total of Versed 2 mg and Fentanyl 100 mcg was administered intravenously. Moderate Sedation Time: 28 minutes. The patient's level of consciousness and vital signs were monitored continuously by radiology nursing throughout the procedure under my direct supervision. CONTRAST:  None FLUOROSCOPY TIME:  24 seconds (6 mGy) COMPLICATIONS: None immediate. PROCEDURE: The procedure, risks, benefits, and alternatives were explained to the patient. Questions regarding the procedure were encouraged and answered. The patient understands and consents to the procedure. The right neck and chest were prepped with chlorhexidine in a sterile fashion, and a sterile drape was applied covering the operative field. Maximum barrier sterile technique with sterile gowns and gloves were used for the procedure. A timeout was performed prior to the initiation of the procedure. Local anesthesia was provided  with 1% lidocaine with epinephrine. After creating a small venotomy incision, a micropuncture kit was utilized to access the internal jugular vein. Real-time ultrasound guidance was utilized for vascular access including the acquisition of a permanent ultrasound image documenting patency of the accessed vessel. The microwire was utilized to measure appropriate catheter length. A subcutaneous port pocket was then created along the upper chest wall utilizing a combination of sharp and blunt dissection. The pocket was irrigated with sterile saline. A single lumen ISP power injectable port was chosen for placement. The 8 Fr catheter was tunneled from the port pocket site to the venotomy incision. The port was placed in the pocket. The external catheter was trimmed to appropriate length. At the venotomy, an 8 Fr peel-away sheath was placed over a guidewire under fluoroscopic guidance. The catheter was then placed through the sheath and the sheath was removed. Final catheter positioning was confirmed and documented with a fluoroscopic spot radiograph. The port was accessed with a Huber needle, aspirated and flushed with heparinized saline. The venotomy site was closed with an interrupted 4-0 Vicryl suture. The port pocket incision was closed with interrupted 2-0 Vicryl suture and the skin was opposed with a running subcuticular 4-0 Vicryl suture. Dermabond and Steri-strips were applied to both incisions. Dressings were placed. The patient tolerated the procedure well without immediate post procedural complication. FINDINGS: After catheter placement, the tip lies within the superior cavoatrial junction. The catheter aspirates and flushes normally and is ready for immediate use. IMPRESSION: Successful placement of a right internal jugular approach power injectable Port-A-Cath. The catheter is ready for immediate use. Electronically Signed   By: Sandi Mariscal M.D.   On: 08/17/2017 14:56     ASSESSMENT/PLAN:   Adenocarcinoma of right lung, stage 4 ([facility]) This is a very pleasant 77 year old white female with stage IV (T3, N0, M1 a)non-small cell lung cancer, well-differentiated adenocarcinoma presented with multifocal disease involving mainly the right upper lobe as well as the right middle lobe and left lower lobe of the lung diagnosed in May 2019. The patient is currently on treatment with carboplatin for an AUC of 5, Alimta 500 mg meter squared, and Keytruda 200 mg IV every 3 weeks.    She tolerated the first cycle well with the exception of fatigue and constipation. Labs were reviewed with patient which are remain stable.  For constipation, I have advised her to take a stool softener twice a day along with MiraLAX 1 capful mixed in water daily.  For her cough, I recommend that she try Mucinex.  The patient will follow-up in 2 weeks for evaluation prior to cycle 2 of her treatment.  She will continue to have weekly labs while receiving her treatment.  She was advised to call immediately if she has any concerning symptoms in the interval.  The patient voices understanding of current disease status and treatment options and is in agreement with the current care plan.  All questions were answered. The patient knows  to call the clinic with any problems, questions or concerns. We can certainly see the patient much sooner if necessary.   No orders of the defined types were placed in this encounter.  Mikey Bussing, DNP, AGPCNP-BC, AOCNP 08/24/17

## 2017-08-24 NOTE — Patient Instructions (Signed)
Implanted Port Home Guide An implanted port is a type of central line that is placed under the skin. Central lines are used to provide IV access when treatment or nutrition needs to be given through a person's veins. Implanted ports are used for long-term IV access. An implanted port may be placed because:  You need IV medicine that would be irritating to the small veins in your hands or arms.  You need long-term IV medicines, such as antibiotics.  You need IV nutrition for a long period.  You need frequent blood draws for lab tests.  You need dialysis.  Implanted ports are usually placed in the chest area, but they can also be placed in the upper arm, the abdomen, or the leg. An implanted port has two main parts:  Reservoir. The reservoir is round and will appear as a small, raised area under your skin. The reservoir is the part where a needle is inserted to give medicines or draw blood.  Catheter. The catheter is a thin, flexible tube that extends from the reservoir. The catheter is placed into a large vein. Medicine that is inserted into the reservoir goes into the catheter and then into the vein.  How will I care for my incision site? Do not get the incision site wet. Bathe or shower as directed by your health care provider. How is my port accessed? Special steps must be taken to access the port:  Before the port is accessed, a numbing cream can be placed on the skin. This helps numb the skin over the port site.  Your health care provider uses a sterile technique to access the port. ? Your health care provider must put on a mask and sterile gloves. ? The skin over your port is cleaned carefully with an antiseptic and allowed to dry. ? The port is gently pinched between sterile gloves, and a needle is inserted into the port.  Only "non-coring" port needles should be used to access the port. Once the port is accessed, a blood return should be checked. This helps ensure that the port  is in the vein and is not clogged.  If your port needs to remain accessed for a constant infusion, a clear (transparent) bandage will be placed over the needle site. The bandage and needle will need to be changed every week, or as directed by your health care provider.  Keep the bandage covering the needle clean and dry. Do not get it wet. Follow your health care provider's instructions on how to take a shower or bath while the port is accessed.  If your port does not need to stay accessed, no bandage is needed over the port.  What is flushing? Flushing helps keep the port from getting clogged. Follow your health care provider's instructions on how and when to flush the port. Ports are usually flushed with saline solution or a medicine called heparin. The need for flushing will depend on how the port is used.  If the port is used for intermittent medicines or blood draws, the port will need to be flushed: ? After medicines have been given. ? After blood has been drawn. ? As part of routine maintenance.  If a constant infusion is running, the port may not need to be flushed.  How long will my port stay implanted? The port can stay in for as long as your health care provider thinks it is needed. When it is time for the port to come out, surgery will be   done to remove it. The procedure is similar to the one performed when the port was put in. When should I seek immediate medical care? When you have an implanted port, you should seek immediate medical care if:  You notice a bad smell coming from the incision site.  You have swelling, redness, or drainage at the incision site.  You have more swelling or pain at the port site or the surrounding area.  You have a fever that is not controlled with medicine.  This information is not intended to replace advice given to you by your health care provider. Make sure you discuss any questions you have with your health care provider. Document  Released: 02/28/2005 Document Revised: 08/06/2015 Document Reviewed: 11/05/2012 Elsevier Interactive Patient Education  2017 [employer]  

## 2017-08-28 ENCOUNTER — Telehealth: Payer: Self-pay | Admitting: *Deleted

## 2017-08-28 NOTE — Telephone Encounter (Signed)
"  Need help.  Cough has worsened.    Oxygen saturation = 83 % to 89 %.  After walking to bathroom level is down to 49 % to 50%.    No fever, pain, N/V, diarrhea just frequent cough with constant, clear phlegm that's already thin I do not need Mucinex.  I need something to dry it up.  Drinking fluids well.  Connected two oxygen tubings to extend length.  Increased oxygen to 3.5 L.    Oxygen connects to CPAP at night, CPAP has a humidification tank.    Every morning my nose has bled enough to cover a few tissues.    Using Tessalon, Symbicort and Duo neb daily as ordered.   Can be reached at [phone]."

## 2017-08-28 NOTE — Telephone Encounter (Signed)
Telephone call to pt re incessant coughing and productive of white phlegm -"It is driving me bananas". I asked pt if she needs to go to ED. 'No , I don't think I need to go there" . Tesslon does not help. She does not want anything to loosen up secretions like Mucinex . I told her to try Delsym and to use a humidifier to help humidify air in her house. I also recommend calling Dr Lenna Gilford.  To reduce nasal dryness and bleeding from nasal cannula she is using vaseline.

## 2017-08-28 NOTE — Telephone Encounter (Signed)
No. Thank you.

## 2017-08-29 ENCOUNTER — Telehealth: Payer: Self-pay | Admitting: Pulmonary Disease

## 2017-08-29 MED ORDER — GUAIFENESIN-CODEINE 100-10 MG/5ML PO SYRP: 5.0000 mL | ORAL_SOLUTION | Freq: Four times a day (QID) | ORAL | 0 refills | Status: DC | PRN

## 2017-08-29 NOTE — Telephone Encounter (Signed)
Spoke with pt. Pt is wanting to speak to Dr. Lenna Gilford about getting humidifier for her bedroom. She is wanting to know if Dr. Lenna Gilford feels this could help with her coughing at night time. Pt has been taking Mucinex but this is not helping with her cough.  Dr. Lenna Gilford  - please advise. Thanks.

## 2017-08-29 NOTE — Telephone Encounter (Signed)
Per SN >> a humidifer won't do much good. Give pt Cheratussin 1 tsp q6h prn #262mL.  Spoke with pt. States that SN called her with his recommendations. Rx and coupon for GoodRx have been left up front for the pt to pick up. Nothing further was needed.

## 2017-08-31 ENCOUNTER — Inpatient Hospital Stay: Payer: PPO | Admitting: Nutrition

## 2017-08-31 ENCOUNTER — Inpatient Hospital Stay: Payer: PPO

## 2017-08-31 DIAGNOSIS — C3491 Malignant neoplasm of unspecified part of right bronchus or lung: Secondary | ICD-10-CM

## 2017-08-31 DIAGNOSIS — Z5111 Encounter for antineoplastic chemotherapy: Secondary | ICD-10-CM | POA: Diagnosis not present

## 2017-08-31 DIAGNOSIS — Z95828 Presence of other vascular implants and grafts: Secondary | ICD-10-CM

## 2017-08-31 LAB — CMP (CANCER CENTER ONLY)
ALBUMIN: 3.2 g/dL — AB (ref 3.5–5.0)
ALK PHOS: 54 U/L (ref 40–150)
ALT: 15 U/L (ref 0–55)
ANION GAP: 10 (ref 3–11)
AST: 18 U/L (ref 5–34)
BUN: 12 mg/dL (ref 7–26)
CALCIUM: 9.3 mg/dL (ref 8.4–10.4)
CO2: 31 mmol/L — AB (ref 22–29)
Chloride: 99 mmol/L (ref 98–109)
Creatinine: 0.72 mg/dL (ref 0.60–1.10)
GFR, Estimated: >60 mL/min (ref >60)
GLUCOSE: 111 mg/dL (ref 70–140)
Potassium: 3.9 mmol/L (ref 3.5–5.1)
SODIUM: 140 mmol/L (ref 136–145)
Total Bilirubin: 0.6 mg/dL (ref 0.2–1.2)
Total Protein: 6.5 g/dL (ref 6.4–8.3)

## 2017-08-31 LAB — CBC WITH DIFFERENTIAL (CANCER CENTER ONLY)
BASOS PCT: 0 %
Basophils Absolute: 0 10*3/uL (ref 0.0–0.1)
EOS ABS: 0 10*3/uL (ref 0.0–0.5)
EOS PCT: 0 %
HCT: 44.3 % (ref 34.8–46.6)
HEMOGLOBIN: 14.3 g/dL (ref 11.6–15.9)
LYMPHS ABS: 0.9 10*3/uL (ref 0.9–3.3)
Lymphocytes Relative: 14 %
MCH: 31 pg (ref 25.1–34.0)
MCHC: 32.3 g/dL (ref 31.5–36.0)
MCV: 95.9 fL (ref 79.5–101.0)
MONOS PCT: 9 %
Monocytes Absolute: 0.6 10*3/uL (ref 0.1–0.9)
NEUTROS PCT: 77 %
Neutro Abs: 5 10*3/uL (ref 1.5–6.5)
PLATELETS: 126 10*3/uL — AB (ref 145–400)
RBC: 4.62 MIL/uL (ref 3.70–5.45)
RDW: 15.5 % — ABNORMAL HIGH (ref 11.2–14.5)
WBC Count: 6.5 10*3/uL (ref 3.9–10.3)

## 2017-08-31 LAB — ACID FAST CULTURE WITH REFLEXED SENSITIVITIES (MYCOBACTERIA): Acid Fast Culture: NEGATIVE

## 2017-08-31 MED ORDER — HEPARIN SOD (PORK) LOCK FLUSH 100 UNIT/ML IV SOLN
500.0000 [IU] | Freq: Once | INTRAVENOUS | Status: AC | PRN
  Administered 2017-08-31: 500 [IU]
  Filled 2017-08-31: qty 5

## 2017-08-31 MED ORDER — SODIUM CHLORIDE 0.9% FLUSH
10.0000 mL | INTRAVENOUS | Status: DC | PRN
  Administered 2017-08-31: 10 mL
  Filled 2017-08-31: qty 10

## 2017-08-31 NOTE — Progress Notes (Signed)
77 year old female diagnosed with stage IV non-small cell lung cancer.   She is a patient of Dr. [name].  Past medical history includes anxiety, diabetes, dyslipidemia, hypertension, hyperlipidemia, hypothyroidism, and obesity.  Medications include Folvite, Synthroid, prednisone, Compazine, and Crestor.  Labs include glucose 104 and albumin 3.4.  Height: 64-3/4 inch. Weight: 205.2 pounds. Usual body weight: 217 pounds in February 2019. BMI: 34.41.  Patient reports she is lactose intolerant. She denies nausea, vomiting, constipation, and diarrhea. She has questions about eating enough protein.  Nutrition diagnosis:  Food and nutrition related knowledge deficit related to non-small cell lung cancer and associated treatments as evidenced by no prior need for nutrition related information.  Intervention: Patient educated to consume small frequent meals and snacks. Educated patient on sources of protein.  Provided fact sheets on soft protein foods and increasing calories and protein. Also educated patient on taste alterations and provided fact sheet. Provided patient with some oral nutrition supplements samples. Questions were answered.  Teach back method used.  Contact information given.  Monitoring, evaluation, goals: Patient will tolerate adequate calories and protein to maintain weight throughout treatment.  Next visit: To be scheduled as needed.  **Disclaimer: This note was dictated with voice recognition software. Similar sounding words can inadvertently be transcribed and this note may contain transcription errors which may not have been corrected upon publication of note.**

## 2017-09-06 DIAGNOSIS — J449 Chronic obstructive pulmonary disease, unspecified: Secondary | ICD-10-CM | POA: Diagnosis not present

## 2017-09-07 ENCOUNTER — Inpatient Hospital Stay: Payer: PPO

## 2017-09-07 ENCOUNTER — Encounter: Payer: Self-pay | Admitting: Oncology

## 2017-09-07 ENCOUNTER — Telehealth: Payer: Self-pay | Admitting: Oncology

## 2017-09-07 ENCOUNTER — Inpatient Hospital Stay (HOSPITAL_BASED_OUTPATIENT_CLINIC_OR_DEPARTMENT_OTHER): Payer: PPO | Admitting: Oncology

## 2017-09-07 VITALS — BP 156/64 | HR 86 | Temp 98.5°F | Resp 20 | Ht 64.75 in | Wt 204.4 lb

## 2017-09-07 DIAGNOSIS — I10 Essential (primary) hypertension: Secondary | ICD-10-CM

## 2017-09-07 DIAGNOSIS — C3491 Malignant neoplasm of unspecified part of right bronchus or lung: Secondary | ICD-10-CM

## 2017-09-07 DIAGNOSIS — E785 Hyperlipidemia, unspecified: Secondary | ICD-10-CM | POA: Diagnosis not present

## 2017-09-07 DIAGNOSIS — M5430 Sciatica, unspecified side: Secondary | ICD-10-CM | POA: Diagnosis not present

## 2017-09-07 DIAGNOSIS — C3411 Malignant neoplasm of upper lobe, right bronchus or lung: Secondary | ICD-10-CM | POA: Diagnosis not present

## 2017-09-07 DIAGNOSIS — E039 Hypothyroidism, unspecified: Secondary | ICD-10-CM | POA: Diagnosis not present

## 2017-09-07 DIAGNOSIS — Z5111 Encounter for antineoplastic chemotherapy: Secondary | ICD-10-CM | POA: Diagnosis not present

## 2017-09-07 DIAGNOSIS — Z5112 Encounter for antineoplastic immunotherapy: Secondary | ICD-10-CM

## 2017-09-07 DIAGNOSIS — E119 Type 2 diabetes mellitus without complications: Secondary | ICD-10-CM | POA: Diagnosis not present

## 2017-09-07 DIAGNOSIS — Z8542 Personal history of malignant neoplasm of other parts of uterus: Secondary | ICD-10-CM

## 2017-09-07 DIAGNOSIS — G4730 Sleep apnea, unspecified: Secondary | ICD-10-CM

## 2017-09-07 DIAGNOSIS — J439 Emphysema, unspecified: Secondary | ICD-10-CM

## 2017-09-07 DIAGNOSIS — F419 Anxiety disorder, unspecified: Secondary | ICD-10-CM

## 2017-09-07 DIAGNOSIS — C7802 Secondary malignant neoplasm of left lung: Secondary | ICD-10-CM | POA: Diagnosis not present

## 2017-09-07 DIAGNOSIS — Z95828 Presence of other vascular implants and grafts: Secondary | ICD-10-CM

## 2017-09-07 DIAGNOSIS — I700 Atherosclerosis of aorta: Secondary | ICD-10-CM

## 2017-09-07 DIAGNOSIS — C7801 Secondary malignant neoplasm of right lung: Secondary | ICD-10-CM | POA: Diagnosis not present

## 2017-09-07 LAB — CBC WITH DIFFERENTIAL (CANCER CENTER ONLY)
BASOS PCT: 0 %
Basophils Absolute: 0 10*3/uL (ref 0.0–0.1)
EOS ABS: 0.1 10*3/uL (ref 0.0–0.5)
EOS PCT: 1 %
HEMATOCRIT: 44.1 % (ref 34.8–46.6)
Hemoglobin: 14.7 g/dL (ref 11.6–15.9)
Lymphocytes Relative: 19 %
Lymphs Abs: 2.2 10*3/uL (ref 0.9–3.3)
MCH: 30.9 pg (ref 25.1–34.0)
MCHC: 33.3 g/dL (ref 31.5–36.0)
MCV: 92.6 fL (ref 79.5–101.0)
MONO ABS: 0.9 10*3/uL (ref 0.1–0.9)
Monocytes Relative: 8 %
NEUTROS ABS: 8.6 10*3/uL — AB (ref 1.5–6.5)
Neutrophils Relative %: 72 %
Platelet Count: 267 10*3/uL (ref 145–400)
RBC: 4.77 MIL/uL (ref 3.70–5.45)
RDW: 16.1 % — ABNORMAL HIGH (ref 11.2–14.5)
WBC: 12 10*3/uL — AB (ref 3.9–10.3)

## 2017-09-07 LAB — CMP (CANCER CENTER ONLY)
ALBUMIN: 3.1 g/dL — AB (ref 3.5–5.0)
ALT: 15 U/L (ref 0–44)
AST: 15 U/L (ref 15–41)
Alkaline Phosphatase: 57 U/L (ref 38–126)
Anion gap: 8 (ref 5–15)
BILIRUBIN TOTAL: 0.3 mg/dL (ref 0.3–1.2)
BUN: 11 mg/dL (ref 8–23)
CALCIUM: 9.4 mg/dL (ref 8.9–10.3)
CO2: 33 mmol/L — ABNORMAL HIGH (ref 22–32)
Chloride: 100 mmol/L (ref 98–111)
Creatinine: 0.71 mg/dL (ref 0.44–1.00)
GFR, Est AFR Am: >60 mL/min (ref >60)
Glucose, Bld: 117 mg/dL — ABNORMAL HIGH (ref 70–99)
POTASSIUM: 3.7 mmol/L (ref 3.5–5.1)
Sodium: 141 mmol/L (ref 135–145)
Total Protein: 6.8 g/dL (ref 6.5–8.1)

## 2017-09-07 LAB — TSH: TSH: 4.668 u[IU]/mL — ABNORMAL HIGH (ref 0.308–3.960)

## 2017-09-07 MED ORDER — PALONOSETRON HCL INJECTION 0.25 MG/5ML
INTRAVENOUS | Status: AC
  Filled 2017-09-07: qty 5

## 2017-09-07 MED ORDER — HEPARIN SOD (PORK) LOCK FLUSH 100 UNIT/ML IV SOLN
500.0000 [IU] | Freq: Once | INTRAVENOUS | Status: AC | PRN
  Administered 2017-09-07: 500 [IU]
  Filled 2017-09-07: qty 5

## 2017-09-07 MED ORDER — PALONOSETRON HCL INJECTION 0.25 MG/5ML
0.2500 mg | Freq: Once | INTRAVENOUS | Status: AC
  Administered 2017-09-07: 0.25 mg via INTRAVENOUS

## 2017-09-07 MED ORDER — SODIUM CHLORIDE 0.9 % IV SOLN
489.5000 mg | Freq: Once | INTRAVENOUS | Status: AC
  Administered 2017-09-07: 490 mg via INTRAVENOUS
  Filled 2017-09-07: qty 49

## 2017-09-07 MED ORDER — SODIUM CHLORIDE 0.9 % IV SOLN
Freq: Once | INTRAVENOUS | Status: AC
  Administered 2017-09-07: 10:00:00 via INTRAVENOUS

## 2017-09-07 MED ORDER — SODIUM CHLORIDE 0.9 % IV SOLN
Freq: Once | INTRAVENOUS | Status: AC
  Administered 2017-09-07: 10:00:00 via INTRAVENOUS
  Filled 2017-09-07: qty 5

## 2017-09-07 MED ORDER — SODIUM CHLORIDE 0.9 % IV SOLN
490.0000 mg/m2 | Freq: Once | INTRAVENOUS | Status: AC
  Administered 2017-09-07: 1000 mg via INTRAVENOUS
  Filled 2017-09-07: qty 40

## 2017-09-07 MED ORDER — SODIUM CHLORIDE 0.9% FLUSH
10.0000 mL | INTRAVENOUS | Status: DC | PRN
  Administered 2017-09-07: 10 mL
  Filled 2017-09-07: qty 10

## 2017-09-07 MED ORDER — SODIUM CHLORIDE 0.9 % IV SOLN
200.0000 mg | Freq: Once | INTRAVENOUS | Status: AC
  Administered 2017-09-07: 200 mg via INTRAVENOUS
  Filled 2017-09-07: qty 8

## 2017-09-07 NOTE — Progress Notes (Signed)
[facility] OFFICE PROGRESS NOTE  Erin Battles, MD [place] [address]  DIAGNOSIS: highly suspicious stage IV (T3, N0, M1 a)non-small cell lung cancer, well-differentiated adenocarcinoma presented with multifocal disease involving mainly the right upper lobe as well as the right middle lobe and left lower lobe of the lung diagnosed in May 2019.  Guardant 360 testing was negative for actionable mutations.  PRIOR THERAPY: None  CURRENT THERAPY: Carboplatin for an AUC of 5, Alimta 500 mg meter squared, and Keytruda 200 mg IV given every 3 weeks. First dose given on 08/18/2017.  Status post 1 cycle.  INTERVAL HISTORY: Erin Myers 77 y.o. female returns for routine follow-up visit accompanied by her husband.  The patient reports that she is feeling fine today with exception of mild fatigue and sciatica pain.  She denies fevers and chills.  Denies chest pain and hemoptysis.  She has her baseline cough and shortness of breath.  She wears home oxygen.  Denies nausea, vomiting, constipation, diarrhea.  She denies recent weight loss or night sweats.  The patient is here for evaluation prior to cycle #2 over treatment.  MEDICAL HISTORY: Past Medical History:  Diagnosis Date  . Anxiety   . Diabetes mellitus without complication ([place])   . Dyslipidemia   . HTN (hypertension)   . Hyperlipemia   . Hypothyroid   . Hypothyroidism   . Lung cancer ([place]) 2000   Carcinoid, s/p RLL lobectomy  . Obesity   . Ovarian cancer ([place])   . Palpitations 2009 and 2006   Negative nuclear stress test   . Sleep apnea    on C-pap    ALLERGIES:  is allergic to adhesive [tape]; codeine; lactose intolerance (gi); monosodium glutamate; and sulfa antibiotics.  MEDICATIONS:  Current Outpatient Medications  Medication Sig Dispense Refill  . acetaminophen (TYLENOL) 500 MG tablet Take 500 mg by mouth every 6 (six) hours as needed.    . budesonide-formoterol (SYMBICORT)  160-4.5 MCG/ACT inhaler Inhale 2 puffs into the lungs 2 (two) times daily. 1 Inhaler 6  . clorazepate (TRANXENE) 7.5 MG tablet Take 7.5 mg by mouth 2 (two) times daily as needed for anxiety.    . folic acid (FOLVITE) 1 MG tablet Take 1 tablet (1 mg total) by mouth daily. 30 tablet 2  . guaiFENesin-codeine (CHERATUSSIN AC) 100-10 MG/5ML syrup Take 5 mLs by mouth every 6 (six) hours as needed for cough. 240 mL 0  . ipratropium-albuterol (DUONEB) 0.5-2.5 (3) MG/3ML SOLN Take 3 mLs by nebulization 3 (three) times daily. 360 mL 2  . levothyroxine (SYNTHROID, LEVOTHROID) 112 MCG tablet Take 112 mcg by mouth daily before breakfast.    . lidocaine-prilocaine (EMLA) cream Apply 1 application topically as needed. 30 g 0  . losartan-hydrochlorothiazide (HYZAAR) 50-12.5 MG per tablet Take 1 tablet by mouth every morning.     . metoprolol succinate (TOPROL-XL) 50 MG 24 hr tablet Take 50 mg by mouth 2 (two) times daily.     . predniSONE (DELTASONE) 10 MG tablet Take 1 tablet (10 mg total) by mouth daily with breakfast. Alternate 1 tab one day and 1/2 tab the next (Patient taking differently: Take 5-10 mg by mouth daily with breakfast. Alternate 10 mg tab one day and 5 mg tab the next) 30 tablet 0  . rosuvastatin (CRESTOR) 10 MG tablet Take 10 mg by mouth every evening.     . valACYclovir (VALTREX) 1000 MG tablet Take 1,000 mg by mouth daily.  0  .  benzonatate (TESSALON) 100 MG capsule Take by mouth 3 (three) times daily as needed for cough.    . prochlorperazine (COMPAZINE) 10 MG tablet Take 1 tablet (10 mg total) by mouth every 6 (six) hours as needed for nausea or vomiting. (Patient not taking: Reported on 08/24/2017) 30 tablet 0   No current facility-administered medications for this visit.     SURGICAL HISTORY:  Past Surgical History:  Procedure Laterality Date  . ABDOMINAL HYSTERECTOMY  2001  . GRADED EXERCISE TOLERANCE TEST  08/2015    Blood pressure demonstrated a hypertensive response to exercise.   There was less than 0.47m of horizontal ST segment depression in the inferolateral leads. No diagnostic criterial for ischemia. Moderately impaired exercise tolerance. The patient achieved 5.8 mets. This is a low risk study.  . IR FLUORO GUIDE PORT INSERTION RIGHT  08/17/2017  . IR [place] VASC ACCESS RIGHT  08/17/2017  . LOBECTOMY  2000   RLL  . NM MYOCAR PERF WALL MOTION  08/20/2007   EF 74%  EXERCISE CAPACITY 7 METS  . OOPHORECTOMY  2001  . TONSILLECTOMY    . VIDEO BRONCHOSCOPY Bilateral 07/20/2017   Procedure: VIDEO BRONCHOSCOPY WITH FLUORO;  Surgeon: BCollene Gobble MD;  [facility];  Service: Cardiopulmonary;  Laterality: Bilateral;    REVIEW OF SYSTEMS:   Review of Systems  Constitutional: Negative for appetite change, chills, fever and unexpected weight change. Positive for mild fatigue. HENT:   Negative for mouth sores, nosebleeds, sore throat and trouble swallowing.   Eyes: Negative for eye problems and icterus.  Respiratory: Negative for hemoptysis and wheezing.  She has her baseline cough and shortness of breath.  She wears home oxygen.  Cardiovascular: Negative for chest pain and leg swelling.  Gastrointestinal: Negative for abdominal pain, constipation, diarrhea, nausea and vomiting.  Genitourinary: Negative for bladder incontinence, difficulty urinating, dysuria, frequency and hematuria.   Musculoskeletal: Negative for back pain, gait problem, neck pain and neck stiffness. Positive for sciatica pain. Skin: Negative for itching and rash.  Neurological: Negative for dizziness, extremity weakness, gait problem, headaches, light-headedness and seizures.  Hematological: Negative for adenopathy. Does not bruise/bleed easily.  Psychiatric/Behavioral: Negative for confusion, depression and sleep disturbance. The patient is not nervous/anxious.     PHYSICAL EXAMINATION:  Blood pressure (!) 156/64, pulse 86, temperature 98.5 F (36.9 C), temperature source Oral, resp. rate  20, height 5' 4.75" (1.645 m), weight 204 lb 6.4 oz (92.7 kg), SpO2 90 %.  ECOG PERFORMANCE STATUS: 1 - Symptomatic but completely ambulatory  Physical Exam  Constitutional: Oriented to person, place, and time and well-developed, well-nourished, and in no distress. No distress.  HENT:  Head: Normocephalic and atraumatic.  Mouth/Throat: Oropharynx is clear and moist. No oropharyngeal exudate.  Eyes: Conjunctivae are normal. Right eye exhibits no discharge. Left eye exhibits no discharge. No scleral icterus.  Neck: Normal range of motion. Neck supple.  Cardiovascular: Normal rate, regular rhythm, normal heart sounds and intact distal pulses.   Pulmonary/Chest: Effort normal.  Scattered rhonchi that clear with coughing.  Abdominal: Soft. Bowel sounds are normal. Exhibits no distension and no mass. There is no tenderness.  Musculoskeletal: Normal range of motion. Exhibits no edema.  Lymphadenopathy:    No cervical adenopathy.  Neurological: Alert and oriented to person, place, and time. Exhibits normal muscle tone. Gait normal. Coordination normal.  Skin: Skin is warm and dry. No rash noted. Not diaphoretic. No erythema. No pallor.  Psychiatric: Mood, memory and judgment normal.  Vitals reviewed.  LABORATORY DATA: Lab Results  Component Value Date   WBC 12.0 (H) 09/07/2017   HGB 14.7 09/07/2017   HCT 44.1 09/07/2017   MCV 92.6 09/07/2017   PLT 267 09/07/2017      Chemistry      Component Value Date/Time   NA 141 09/07/2017 0800   K 3.7 09/07/2017 0800   CL 100 09/07/2017 0800   CO2 33 (H) 09/07/2017 0800   BUN 11 09/07/2017 0800   CREATININE 0.71 09/07/2017 0800      Component Value Date/Time   CALCIUM 9.4 09/07/2017 0800   ALKPHOS 57 09/07/2017 0800   AST 15 09/07/2017 0800   ALT 15 09/07/2017 0800   BILITOT 0.3 09/07/2017 0800       RADIOGRAPHIC STUDIES:  Mr Jeri Cos YQ Contrast  Result Date: 08/09/2017 CLINICAL DATA:  Non-small cell lung cancer.  Staging. EXAM:  MRI HEAD WITHOUT AND WITH CONTRAST TECHNIQUE: Multiplanar, multiecho pulse sequences of the brain and surrounding structures were obtained without and with intravenous contrast. CONTRAST:  69m MULTIHANCE GADOBENATE DIMEGLUMINE 529 MG/ML IV SOLN COMPARISON:  None. FINDINGS: Brain: Diffusion imaging does not show any acute or subacute infarction. The brainstem and cerebellum are normal. Cerebral hemispheres show mild to moderate chronic small-vessel ischemic changes of the deep and subcortical white matter. No cortical or large vessel territory infarction. No primary or metastatic mass lesion, hemorrhage, hydrocephalus or extra-axial collection. No abnormal enhancement. Vascular: Major vessels at the base of the brain show flow. Skull and upper cervical spine: Negative Sinuses/Orbits: Clear/normal Other: None IMPRESSION: No evidence of metastatic disease. Mild to moderate chronic small-vessel ischemic changes of the cerebral hemispheric white matter. Electronically Signed   By: MNelson ChimesM.D.   On: 08/09/2017 10:21   Nm Pet Image Initial (pi) Skull Base To Thigh  Result Date: 08/09/2017 CLINICAL DATA:  Initial treatment strategy for lung cancer. EXAM: NUCLEAR MEDICINE PET SKULL BASE TO THIGH TECHNIQUE: 10.5 mCi F-18 FDG was injected intravenously. Full-ring PET imaging was performed from the skull base to thigh after the radiotracer. CT data was obtained and used for attenuation correction and anatomic localization. Fasting blood glucose: 117 mg/dl COMPARISON:  CT abdomen pelvis 07/18/2017 and CT chest 07/12/2017. FINDINGS: Mediastinal blood pool activity: SUV max 2.1 NECK: Focal hypermetabolism is seen in the anterior hypopharynx without a CT correlate. No hypermetabolic lymph nodes. Incidental CT findings: None. CHEST: No hypermetabolic mediastinal, hilar or axillary lymph nodes. There is patchy ground-glass and consolidation in the right upper lobe with additional patchy ground-glass in lower lobes  bilaterally, with low level associated hypermetabolism. No hypermetabolic pulmonary nodules. Incidental CT findings: Atherosclerotic calcification of the arterial vasculature. No pericardial or pleural effusion. ABDOMEN/PELVIS: No abnormal hypermetabolism in the liver, adrenal glands, spleen or pancreas. No hypermetabolic lymph nodes. Incidental CT findings: Atherosclerotic calcification of the arterial vasculature without abdominal aortic aneurysm. SKELETON: No abnormal osseous hypermetabolism. Incidental CT findings: Degenerative changes in the spine. IMPRESSION: 1. Borderline hypermetabolic right upper lobe consolidation and ground-glass with hypometabolic patchy ground-glass in both lower lobes. Findings may be infectious or inflammatory in etiology. Difficult to definitively exclude low-grade adenocarcinoma in the right upper lobe. 2. Focal hypermetabolism along the ventral aspect of the hypopharynx without a CT correlate. 3.  Aortic atherosclerosis (ICD10-170.0). Electronically Signed   By: MLorin PicketM.D.   On: 08/09/2017 10:46   Ir [place]Guide Vasc Access Right  Result Date: 08/17/2017 INDICATION: History of multifocal lung cancer. In need of durable intravenous for chemotherapy administration. EXAM:  IMPLANTED PORT A CATH PLACEMENT WITH ULTRASOUND AND FLUOROSCOPIC GUIDANCE COMPARISON:  PET CT - 08/09/2017; chest CT  -07/22/2017 MEDICATIONS: Ancef 2 gm IV; The antibiotic was administered within an appropriate time interval prior to skin puncture. ANESTHESIA/SEDATION: Moderate (conscious) sedation was employed during this procedure. A total of Versed 2 mg and Fentanyl 100 mcg was administered intravenously. Moderate Sedation Time: 28 minutes. The patient's level of consciousness and vital signs were monitored continuously by radiology nursing throughout the procedure under my direct supervision. CONTRAST:  None FLUOROSCOPY TIME:  24 seconds (6 mGy) COMPLICATIONS: None immediate. PROCEDURE: The procedure,  risks, benefits, and alternatives were explained to the patient. Questions regarding the procedure were encouraged and answered. The patient understands and consents to the procedure. The right neck and chest were prepped with chlorhexidine in a sterile fashion, and a sterile drape was applied covering the operative field. Maximum barrier sterile technique with sterile gowns and gloves were used for the procedure. A timeout was performed prior to the initiation of the procedure. Local anesthesia was provided with 1% lidocaine with epinephrine. After creating a small venotomy incision, a micropuncture kit was utilized to access the internal jugular vein. Real-time ultrasound guidance was utilized for vascular access including the acquisition of a permanent ultrasound image documenting patency of the accessed vessel. The microwire was utilized to measure appropriate catheter length. A subcutaneous port pocket was then created along the upper chest wall utilizing a combination of sharp and blunt dissection. The pocket was irrigated with sterile saline. A single lumen ISP power injectable port was chosen for placement. The 8 Fr catheter was tunneled from the port pocket site to the venotomy incision. The port was placed in the pocket. The external catheter was trimmed to appropriate length. At the venotomy, an 8 Fr peel-away sheath was placed over a guidewire under fluoroscopic guidance. The catheter was then placed through the sheath and the sheath was removed. Final catheter positioning was confirmed and documented with a fluoroscopic spot radiograph. The port was accessed with a Huber needle, aspirated and flushed with heparinized saline. The venotomy site was closed with an interrupted 4-0 Vicryl suture. The port pocket incision was closed with interrupted 2-0 Vicryl suture and the skin was opposed with a running subcuticular 4-0 Vicryl suture. Dermabond and Steri-strips were applied to both incisions. Dressings  were placed. The patient tolerated the procedure well without immediate post procedural complication. FINDINGS: After catheter placement, the tip lies within the superior cavoatrial junction. The catheter aspirates and flushes normally and is ready for immediate use. IMPRESSION: Successful placement of a right internal jugular approach power injectable Port-A-Cath. The catheter is ready for immediate use. Electronically Signed   By: Sandi Mariscal M.D.   On: 08/17/2017 14:56   Ir Fluoro Guide Port Insertion Right  Result Date: 08/17/2017 INDICATION: History of multifocal lung cancer. In need of durable intravenous for chemotherapy administration. EXAM: IMPLANTED PORT A CATH PLACEMENT WITH ULTRASOUND AND FLUOROSCOPIC GUIDANCE COMPARISON:  PET CT - 08/09/2017; chest CT  -07/22/2017 MEDICATIONS: Ancef 2 gm IV; The antibiotic was administered within an appropriate time interval prior to skin puncture. ANESTHESIA/SEDATION: Moderate (conscious) sedation was employed during this procedure. A total of Versed 2 mg and Fentanyl 100 mcg was administered intravenously. Moderate Sedation Time: 28 minutes. The patient's level of consciousness and vital signs were monitored continuously by radiology nursing throughout the procedure under my direct supervision. CONTRAST:  None FLUOROSCOPY TIME:  24 seconds (6 mGy) COMPLICATIONS: None immediate. PROCEDURE: The procedure, risks,  benefits, and alternatives were explained to the patient. Questions regarding the procedure were encouraged and answered. The patient understands and consents to the procedure. The right neck and chest were prepped with chlorhexidine in a sterile fashion, and a sterile drape was applied covering the operative field. Maximum barrier sterile technique with sterile gowns and gloves were used for the procedure. A timeout was performed prior to the initiation of the procedure. Local anesthesia was provided with 1% lidocaine with epinephrine. After creating a small  venotomy incision, a micropuncture kit was utilized to access the internal jugular vein. Real-time ultrasound guidance was utilized for vascular access including the acquisition of a permanent ultrasound image documenting patency of the accessed vessel. The microwire was utilized to measure appropriate catheter length. A subcutaneous port pocket was then created along the upper chest wall utilizing a combination of sharp and blunt dissection. The pocket was irrigated with sterile saline. A single lumen ISP power injectable port was chosen for placement. The 8 Fr catheter was tunneled from the port pocket site to the venotomy incision. The port was placed in the pocket. The external catheter was trimmed to appropriate length. At the venotomy, an 8 Fr peel-away sheath was placed over a guidewire under fluoroscopic guidance. The catheter was then placed through the sheath and the sheath was removed. Final catheter positioning was confirmed and documented with a fluoroscopic spot radiograph. The port was accessed with a Huber needle, aspirated and flushed with heparinized saline. The venotomy site was closed with an interrupted 4-0 Vicryl suture. The port pocket incision was closed with interrupted 2-0 Vicryl suture and the skin was opposed with a running subcuticular 4-0 Vicryl suture. Dermabond and Steri-strips were applied to both incisions. Dressings were placed. The patient tolerated the procedure well without immediate post procedural complication. FINDINGS: After catheter placement, the tip lies within the superior cavoatrial junction. The catheter aspirates and flushes normally and is ready for immediate use. IMPRESSION: Successful placement of a right internal jugular approach power injectable Port-A-Cath. The catheter is ready for immediate use. Electronically Signed   By: Sandi Mariscal M.D.   On: 08/17/2017 14:56     ASSESSMENT/PLAN:  Adenocarcinoma of right lung, stage 4 (HCC) This is a very pleasant 77  year old white female with stage IV (T3, N0, M1 a)non-small cell lung cancer, well-differentiated adenocarcinoma presented with multifocal disease involving mainly the right upper lobe as well as the right middle lobe and left lower lobe of the lung diagnosed in May 2019. The patient is currently on treatment with carboplatin for an AUC of 5, Alimta 500 mg meter squared, and Keytruda 200 mg IV every 3 weeks. She tolerated the first cycle well with the exception of fatigue. Recommend for her to proceed with cycle 2 of her treatment today as scheduled.  For her sciatica pain, she is asked that she may use ibuprofen.  She was advised to minimize her ibuprofen use especially around the time that she receives Alimta due to risk of worsening renal insufficiency and bleeding.  She was advised to use Tylenol instead.  The patient will follow-up in 3 weeks for evaluation prior to cycle #3 of her treatment.  She was advised to call immediately if she has any concerning symptoms in the interval.  The patient voices understanding of current disease status and treatment options and is in agreement with the current care plan.  All questions were answered. The patient knows to call the clinic with any problems, questions or concerns.  We can certainly see the patient much sooner if necessary.   No orders of the defined types were placed in this encounter.  Mikey Bussing, DNP, AGPCNP-BC, AOCNP 09/07/17

## 2017-09-07 NOTE — Patient Instructions (Signed)
You will receive 4 cycles of Carboplatin, Alimta, and Keytruda for 4 cycles. Beginning with cycle 5, we will drop the Carboplatin and continue Alimta and Keytruda.

## 2017-09-07 NOTE — Patient Instructions (Signed)
[facility] Discharge Instructions for Patients Receiving Chemotherapy  Today you received the following chemotherapy agents: Keytruda, Alimta, Carbioplatin  To help prevent nausea and vomiting after your treatment, we encourage you to take your nausea medication as directed.   If you develop nausea and vomiting that is not controlled by your nausea medication, call the clinic.   BELOW ARE SYMPTOMS THAT SHOULD BE REPORTED IMMEDIATELY:  *FEVER GREATER THAN 100.5 F  *CHILLS WITH OR WITHOUT FEVER  NAUSEA AND VOMITING THAT IS NOT CONTROLLED WITH YOUR NAUSEA MEDICATION  *UNUSUAL SHORTNESS OF BREATH  *UNUSUAL BRUISING OR BLEEDING  TENDERNESS IN MOUTH AND THROAT WITH OR WITHOUT PRESENCE OF ULCERS  *URINARY PROBLEMS  *BOWEL PROBLEMS  UNUSUAL RASH Items with * indicate a potential emergency and should be followed up as soon as possible.  Feel free to call the clinic should you have any questions or concerns. The clinic phone number is [phone].  Please show the [facility] at check-in to the Emergency Department and triage nurse.

## 2017-09-07 NOTE — Telephone Encounter (Signed)
Scheduled appt per 6/27 los -pt to get an updated schedule in treatment area.

## 2017-09-07 NOTE — Assessment & Plan Note (Signed)
This is a very pleasant 77 year old white female with stage IV (T3, N0, M1 a)non-small cell lung cancer, well-differentiated adenocarcinoma presented with multifocal disease involving mainly the right upper lobe as well as the right middle lobe and left lower lobe of the lung diagnosed in May 2019. The patient is currently on treatment with carboplatin for an AUC of 5, Alimta 500 mg meter squared, and Keytruda 200 mg IV every 3 weeks. She tolerated the first cycle well with the exception of fatigue. Recommend for her to proceed with cycle 2 of her treatment today as scheduled.  For her sciatica pain, she is asked that she may use ibuprofen.  She was advised to minimize her ibuprofen use especially around the time that she receives Alimta due to risk of worsening renal insufficiency and bleeding.  She was advised to use Tylenol instead.  The patient will follow-up in 3 weeks for evaluation prior to cycle #3 of her treatment.  She was advised to call immediately if she has any concerning symptoms in the interval.  The patient voices understanding of current disease status and treatment options and is in agreement with the current care plan.  All questions were answered. The patient knows to call the clinic with any problems, questions or concerns. We can certainly see the patient much sooner if necessary.

## 2017-09-13 ENCOUNTER — Inpatient Hospital Stay: Payer: PPO | Attending: Internal Medicine

## 2017-09-13 ENCOUNTER — Inpatient Hospital Stay: Payer: PPO

## 2017-09-13 DIAGNOSIS — Z5111 Encounter for antineoplastic chemotherapy: Secondary | ICD-10-CM | POA: Diagnosis not present

## 2017-09-13 DIAGNOSIS — E119 Type 2 diabetes mellitus without complications: Secondary | ICD-10-CM | POA: Diagnosis not present

## 2017-09-13 DIAGNOSIS — C3411 Malignant neoplasm of upper lobe, right bronchus or lung: Secondary | ICD-10-CM | POA: Insufficient documentation

## 2017-09-13 DIAGNOSIS — Z8041 Family history of malignant neoplasm of ovary: Secondary | ICD-10-CM | POA: Insufficient documentation

## 2017-09-13 DIAGNOSIS — F419 Anxiety disorder, unspecified: Secondary | ICD-10-CM | POA: Insufficient documentation

## 2017-09-13 DIAGNOSIS — Z79899 Other long term (current) drug therapy: Secondary | ICD-10-CM | POA: Insufficient documentation

## 2017-09-13 DIAGNOSIS — I10 Essential (primary) hypertension: Secondary | ICD-10-CM | POA: Diagnosis not present

## 2017-09-13 DIAGNOSIS — C7800 Secondary malignant neoplasm of unspecified lung: Secondary | ICD-10-CM | POA: Insufficient documentation

## 2017-09-13 DIAGNOSIS — E785 Hyperlipidemia, unspecified: Secondary | ICD-10-CM | POA: Diagnosis not present

## 2017-09-13 DIAGNOSIS — E039 Hypothyroidism, unspecified: Secondary | ICD-10-CM | POA: Diagnosis not present

## 2017-09-13 DIAGNOSIS — E669 Obesity, unspecified: Secondary | ICD-10-CM | POA: Insufficient documentation

## 2017-09-13 DIAGNOSIS — Z95828 Presence of other vascular implants and grafts: Secondary | ICD-10-CM

## 2017-09-13 DIAGNOSIS — C3491 Malignant neoplasm of unspecified part of right bronchus or lung: Secondary | ICD-10-CM

## 2017-09-13 LAB — CMP (CANCER CENTER ONLY)
ALBUMIN: 3.2 g/dL — AB (ref 3.5–5.0)
ALK PHOS: 52 U/L (ref 38–126)
ALT: 15 U/L (ref 0–44)
AST: 12 U/L — AB (ref 15–41)
Anion gap: 7 (ref 5–15)
BUN: 17 mg/dL (ref 8–23)
CALCIUM: 9.9 mg/dL (ref 8.9–10.3)
CO2: 36 mmol/L — ABNORMAL HIGH (ref 22–32)
CREATININE: 0.69 mg/dL (ref 0.44–1.00)
Chloride: 97 mmol/L — ABNORMAL LOW (ref 98–111)
GFR, Est AFR Am: >60 mL/min (ref >60)
GLUCOSE: 108 mg/dL — AB (ref 70–99)
POTASSIUM: 4.2 mmol/L (ref 3.5–5.1)
Sodium: 140 mmol/L (ref 135–145)
TOTAL PROTEIN: 6.4 g/dL — AB (ref 6.5–8.1)
Total Bilirubin: 0.5 mg/dL (ref 0.3–1.2)

## 2017-09-13 LAB — CBC WITH DIFFERENTIAL (CANCER CENTER ONLY)
BASOS ABS: 0 10*3/uL (ref 0.0–0.1)
Basophils Relative: 0 %
EOS ABS: 0 10*3/uL (ref 0.0–0.5)
EOS PCT: 0 %
HCT: 45 % (ref 34.8–46.6)
Hemoglobin: 14.3 g/dL (ref 11.6–15.9)
LYMPHS PCT: 14 %
Lymphs Abs: 1.1 10*3/uL (ref 0.9–3.3)
MCH: 30.7 pg (ref 25.1–34.0)
MCHC: 31.8 g/dL (ref 31.5–36.0)
MCV: 96.6 fL (ref 79.5–101.0)
MONO ABS: 0.3 10*3/uL (ref 0.1–0.9)
Monocytes Relative: 3 %
Neutro Abs: 6.5 10*3/uL (ref 1.5–6.5)
Neutrophils Relative %: 83 %
PLATELETS: 138 10*3/uL — AB (ref 145–400)
RBC: 4.66 MIL/uL (ref 3.70–5.45)
RDW: 16.2 % — AB (ref 11.2–14.5)
WBC: 7.9 10*3/uL (ref 3.9–10.3)

## 2017-09-13 MED ORDER — SODIUM CHLORIDE 0.9% FLUSH
10.0000 mL | INTRAVENOUS | Status: DC | PRN
  Administered 2017-09-13: 10 mL
  Filled 2017-09-13: qty 10

## 2017-09-13 MED ORDER — HEPARIN SOD (PORK) LOCK FLUSH 100 UNIT/ML IV SOLN
500.0000 [IU] | Freq: Once | INTRAVENOUS | Status: AC | PRN
  Administered 2017-09-13: 500 [IU]
  Filled 2017-09-13: qty 5

## 2017-09-21 ENCOUNTER — Inpatient Hospital Stay: Payer: PPO

## 2017-09-21 DIAGNOSIS — Z5111 Encounter for antineoplastic chemotherapy: Secondary | ICD-10-CM | POA: Diagnosis not present

## 2017-09-21 DIAGNOSIS — Z95828 Presence of other vascular implants and grafts: Secondary | ICD-10-CM

## 2017-09-21 DIAGNOSIS — C3491 Malignant neoplasm of unspecified part of right bronchus or lung: Secondary | ICD-10-CM

## 2017-09-21 DIAGNOSIS — Z5112 Encounter for antineoplastic immunotherapy: Secondary | ICD-10-CM

## 2017-09-21 LAB — CMP (CANCER CENTER ONLY)
ALT: 16 U/L (ref 0–44)
AST: 13 U/L — ABNORMAL LOW (ref 15–41)
Albumin: 3.3 g/dL — ABNORMAL LOW (ref 3.5–5.0)
Alkaline Phosphatase: 54 U/L (ref 38–126)
Anion gap: 11 (ref 5–15)
BUN: 13 mg/dL (ref 8–23)
CHLORIDE: 99 mmol/L (ref 98–111)
CO2: 33 mmol/L — AB (ref 22–32)
CREATININE: 0.73 mg/dL (ref 0.44–1.00)
Calcium: 9.2 mg/dL (ref 8.9–10.3)
GFR, Est AFR Am: >60 mL/min (ref >60)
GFR, Estimated: >60 mL/min (ref >60)
GLUCOSE: 108 mg/dL — AB (ref 70–99)
Potassium: 3.9 mmol/L (ref 3.5–5.1)
Sodium: 143 mmol/L (ref 135–145)
Total Bilirubin: 0.5 mg/dL (ref 0.3–1.2)
Total Protein: 6.7 g/dL (ref 6.5–8.1)

## 2017-09-21 LAB — CBC WITH DIFFERENTIAL (CANCER CENTER ONLY)
Basophils Absolute: 0 10*3/uL (ref 0.0–0.1)
Basophils Relative: 1 %
EOS ABS: 0.1 10*3/uL (ref 0.0–0.5)
EOS PCT: 1 %
HCT: 40.8 % (ref 34.8–46.6)
HEMOGLOBIN: 13.5 g/dL (ref 11.6–15.9)
LYMPHS ABS: 0.9 10*3/uL (ref 0.9–3.3)
Lymphocytes Relative: 10 %
MCH: 30.7 pg (ref 25.1–34.0)
MCHC: 33.1 g/dL (ref 31.5–36.0)
MCV: 92.8 fL (ref 79.5–101.0)
MONO ABS: 0.7 10*3/uL (ref 0.1–0.9)
MONOS PCT: 7 %
Neutro Abs: 7.8 10*3/uL — ABNORMAL HIGH (ref 1.5–6.5)
Neutrophils Relative %: 81 %
Platelet Count: 155 10*3/uL (ref 145–400)
RBC: 4.39 MIL/uL (ref 3.70–5.45)
RDW: 16.9 % — ABNORMAL HIGH (ref 11.2–14.5)
WBC Count: 9.5 10*3/uL (ref 3.9–10.3)

## 2017-09-21 LAB — TSH: TSH: 2.955 u[IU]/mL (ref 0.308–3.960)

## 2017-09-21 MED ORDER — SODIUM CHLORIDE 0.9% FLUSH
10.0000 mL | INTRAVENOUS | Status: DC | PRN
  Administered 2017-09-21: 10 mL
  Filled 2017-09-21: qty 10

## 2017-09-21 MED ORDER — HEPARIN SOD (PORK) LOCK FLUSH 100 UNIT/ML IV SOLN
500.0000 [IU] | Freq: Once | INTRAVENOUS | Status: AC | PRN
  Administered 2017-09-21: 500 [IU]
  Filled 2017-09-21: qty 5

## 2017-09-22 DIAGNOSIS — G4733 Obstructive sleep apnea (adult) (pediatric): Secondary | ICD-10-CM | POA: Diagnosis not present

## 2017-09-27 NOTE — Assessment & Plan Note (Addendum)
This is a very pleasant 77 year old white female with stage IV (T3, N0, M1 a)non-small cell lung cancer, well-differentiated adenocarcinoma presented with multifocal disease involving mainly the right upper lobe as well as the right middle lobe and left lower lobe of the lung diagnosed in May 2019. The patient is currently on treatment withcarboplatin for an AUC of 5, Alimta 500 mg meter squared, and Keytruda 200 mg IV every 3 weeks.  She is tolerating her treatment well overall with the exception of mild fatigue. Recommend for her to proceed with cycle 3 of her treatment today as scheduled.  She will have a restaging CT scan of the chest, abdomen, pelvis prior to her next visit.  She will follow-up in 3 weeks for evaluation prior to cycle #4 and to review her restaging CT scan results.  She will continue to have weekly labs.  For her rhonchi in her lungs, I have recommend that she use her DuoNeb on a regular basis.  She is not taking as prescribed.  She was given an albuterol nebulizer here in our office today.  She was advised to call immediately if she has any concerning symptoms in the interval.  The patient voices understanding of current disease status and treatment options and is in agreement with the current care plan.  All questions were answered. The patient knows to call the clinic with any problems, questions or concerns. We can certainly see the patient much sooner if necessary.

## 2017-09-27 NOTE — Progress Notes (Signed)
[facility] OFFICE PROGRESS NOTE  [name], MD [place] [address]  DIAGNOSIS: highly suspicious stage IV (T3, N0, M1 a)non-small cell lung cancer, well-differentiated adenocarcinoma presented with multifocal disease involving mainly the right upper lobe as well as the right middle lobe and left lower lobe of the lung diagnosed in May 2019.  Guardant 360 testing was negative for actionable mutations.  PRIOR THERAPY: None  CURRENT THERAPY: Carboplatin for an AUC of 5, Alimta 500 mg meter squared, and Keytruda 200 mg IV given every 3 weeks. First dosegiven on 08/18/2017.Status post 2 cycles.  INTERVAL HISTORY: Erin Myers 77 y.o. female returns for routine follow-up visit accompanied by her husband.  The patient is feeling fine today and has no specific complaints today for ongoing mild fatigue.  She denies fevers and chills.  Denies chest pain and hemoptysis.  She has not ongoing cough and her baseline shortness of breath.  She wears home oxygen.  Denies nausea, vomiting, constipation, diarrhea.  Denies recent weight loss or night sweats.  The patient is here for evaluation prior to cycle #3 of her treatment.  MEDICAL HISTORY: Past Medical History:  Diagnosis Date  . Anxiety   . Diabetes mellitus without complication ([place])   . Dyslipidemia   . HTN (hypertension)   . Hyperlipemia   . Hypothyroid   . Hypothyroidism   . Lung cancer ([facility]) 2000   Carcinoid, s/p RLL lobectomy  . Obesity   . Ovarian cancer ([place])   . Palpitations 2009 and 2006   Negative nuclear stress test   . Sleep apnea    on C-pap    ALLERGIES:  is allergic to adhesive [tape]; codeine; lactose intolerance (gi); monosodium glutamate; and sulfa antibiotics.  MEDICATIONS:  Current Outpatient Medications  Medication Sig Dispense Refill  . acetaminophen (TYLENOL) 500 MG tablet Take 500 mg by mouth every 6 (six) hours as needed.    . benzonatate (TESSALON) 100 MG  capsule Take by mouth 3 (three) times daily as needed for cough.    . budesonide-formoterol (SYMBICORT) 160-4.5 MCG/ACT inhaler Inhale 2 puffs into the lungs 2 (two) times daily. 1 Inhaler 6  . clorazepate (TRANXENE) 7.5 MG tablet Take 7.5 mg by mouth 2 (two) times daily as needed for anxiety.    . folic acid (FOLVITE) 1 MG tablet Take 1 tablet (1 mg total) by mouth daily. 30 tablet 2  . guaiFENesin-codeine (CHERATUSSIN AC) 100-10 MG/5ML syrup Take 5 mLs by mouth every 6 (six) hours as needed for cough. 240 mL 0  . ipratropium-albuterol (DUONEB) 0.5-2.5 (3) MG/3ML SOLN Take 3 mLs by nebulization 3 (three) times daily. 360 mL 2  . levothyroxine (SYNTHROID, LEVOTHROID) 112 MCG tablet Take 112 mcg by mouth daily before breakfast.    . losartan-hydrochlorothiazide (HYZAAR) 50-12.5 MG per tablet Take 1 tablet by mouth every morning.     . metoprolol succinate (TOPROL-XL) 50 MG 24 hr tablet Take 50 mg by mouth 2 (two) times daily.     . predniSONE (DELTASONE) 10 MG tablet Take 1 tablet (10 mg total) by mouth daily with breakfast. Alternate 1 tab one day and 1/2 tab the next (Patient taking differently: Take 5-10 mg by mouth daily with breakfast. Alternate 10 mg tab one day and 5 mg tab the next) 30 tablet 0  . rosuvastatin (CRESTOR) 10 MG tablet Take 10 mg by mouth every evening.     . valACYclovir (VALTREX) 1000 MG tablet Take 1,000 mg by mouth daily.  0  . lidocaine-prilocaine (EMLA) cream Apply 1 application topically as needed. (Patient not taking: Reported on 09/28/2017) 30 g 0  . prochlorperazine (COMPAZINE) 10 MG tablet Take 1 tablet (10 mg total) by mouth every 6 (six) hours as needed for nausea or vomiting. (Patient not taking: Reported on 08/24/2017) 30 tablet 0   No current facility-administered medications for this visit.    Facility-Administered Medications Ordered in Other Visits  Medication Dose Route Frequency Provider Last Rate Last Dose  . sodium chloride flush (NS) 0.9 % injection 10 mL   10 mL Intracatheter PRN Curt Bears, MD   10 mL at 09/28/17 1228    SURGICAL HISTORY:  Past Surgical History:  Procedure Laterality Date  . ABDOMINAL HYSTERECTOMY  2001  . GRADED EXERCISE TOLERANCE TEST  08/2015    Blood pressure demonstrated a hypertensive response to exercise.  There was less than 0.75mm of horizontal ST segment depression in the inferolateral leads. No diagnostic criterial for ischemia. Moderately impaired exercise tolerance. The patient achieved 5.8 mets. This is a low risk study.  . IR FLUORO GUIDE PORT INSERTION RIGHT  08/17/2017  . IR US GUIDE VASC ACCESS RIGHT  08/17/2017  . LOBECTOMY  2000   RLL  . NM MYOCAR PERF WALL MOTION  08/20/2007   EF 74%  EXERCISE CAPACITY 7 METS  . OOPHORECTOMY  2001  . TONSILLECTOMY    . VIDEO BRONCHOSCOPY Bilateral 07/20/2017   Procedure: VIDEO BRONCHOSCOPY WITH FLUORO;  Surgeon: Collene Gobble, MD;  Location: [facility];  Service: Cardiopulmonary;  Laterality: Bilateral;    REVIEW OF SYSTEMS:   Review of Systems  Constitutional: Negative for appetite change, chills, fever and unexpected weight change. Positive for mild fatigue. HENT:   Negative for mouth sores, nosebleeds, sore throat and trouble swallowing.   Eyes: Negative for eye problems and icterus.  Respiratory: Negative for hemoptysis and wheezing.  Positive for cough and her baseline shortness of breath. Cardiovascular: Negative for chest pain and leg swelling.  Gastrointestinal: Negative for abdominal pain, constipation, diarrhea, nausea and vomiting.  Genitourinary: Negative for bladder incontinence, difficulty urinating, dysuria, frequency and hematuria.   Musculoskeletal: Negative for back pain, gait problem, neck pain and neck stiffness.  Skin: Negative for itching and rash.  Neurological: Negative for dizziness, extremity weakness, gait problem, headaches, light-headedness and seizures.  Hematological: Negative for adenopathy. Does not bruise/bleed easily.   Psychiatric/Behavioral: Negative for confusion, depression and sleep disturbance. The patient is not nervous/anxious.     PHYSICAL EXAMINATION:  Blood pressure 130/74, pulse 92, temperature 98.4 F (36.9 C), temperature source Oral, resp. rate 17, height 5' 4.75" (1.645 m), weight 201 lb 9.6 oz (91.4 kg), SpO2 91 %.  ECOG PERFORMANCE STATUS: 1 - Symptomatic but completely ambulatory  Physical Exam  Constitutional: Oriented to person, place, and time and well-developed, well-nourished, and in no distress. No distress.  HENT:  Head: Normocephalic and atraumatic.  Mouth/Throat: Oropharynx is clear and moist. No oropharyngeal exudate.  Eyes: Conjunctivae are normal. Right eye exhibits no discharge. Left eye exhibits no discharge. No scleral icterus.  Neck: Normal range of motion. Neck supple.  Cardiovascular: Normal rate, regular rhythm, normal heart sounds and intact distal pulses.  Scattered rhonchi that clear with cough.  Pulmonary/Chest: Effort normal. No respiratory distress. No wheezes. No rales.  Abdominal: Soft. Bowel sounds are normal. Exhibits no distension and no mass. There is no tenderness.  Musculoskeletal: Normal range of motion. Exhibits no edema.  Lymphadenopathy:    No cervical adenopathy.  Neurological: Alert and oriented to person, place, and time. Exhibits normal muscle tone. Gait normal. Coordination normal.  Skin: Skin is warm and dry. No rash noted. Not diaphoretic. No erythema. No pallor.  Psychiatric: Mood, memory and judgment normal.  Vitals reviewed.  LABORATORY DATA: Lab Results  Component Value Date   WBC 10.7 (H) 09/28/2017   HGB 13.8 09/28/2017   HCT 43.9 09/28/2017   MCV 97.6 09/28/2017   PLT 302 09/28/2017      Chemistry      Component Value Date/Time   NA 141 09/28/2017 0742   K 4.0 09/28/2017 0742   CL 97 (L) 09/28/2017 0742   CO2 34 (H) 09/28/2017 0742   BUN 12 09/28/2017 0742   CREATININE 0.80 09/28/2017 0742      Component Value  Date/Time   CALCIUM 9.6 09/28/2017 0742   ALKPHOS 56 09/28/2017 0742   AST 14 (L) 09/28/2017 0742   ALT 13 09/28/2017 0742   BILITOT 0.3 09/28/2017 0742       RADIOGRAPHIC STUDIES:  No results found.   ASSESSMENT/PLAN:  Adenocarcinoma of right lung, stage 4 (HCC) This is a very pleasant 77 year old white female with stage IV (T3, N0, M1 a)non-small cell lung cancer, well-differentiated adenocarcinoma presented with multifocal disease involving mainly the right upper lobe as well as the right middle lobe and left lower lobe of the lung diagnosed in May 2019. The patient is currently on treatment withcarboplatin for an AUC of 5, Alimta 500 mg meter squared, and Keytruda 200 mg IV every 3 weeks.  She is tolerating her treatment well overall with the exception of mild fatigue. Recommend for her to proceed with cycle 3 of her treatment today as scheduled.  She will have a restaging CT scan of the chest, abdomen, pelvis prior to her next visit.  She will follow-up in 3 weeks for evaluation prior to cycle #4 and to review her restaging CT scan results.  She will continue to have weekly labs.  For her rhonchi in her lungs, I have recommend that she use her DuoNeb on a regular basis.  She is not taking as prescribed.  She was given an albuterol nebulizer here in our office today.  She was advised to call immediately if she has any concerning symptoms in the interval.  The patient voices understanding of current disease status and treatment options and is in agreement with the current care plan.  All questions were answered. The patient knows to call the clinic with any problems, questions or concerns. We can certainly see the patient much sooner if necessary.   Orders Placed This Encounter  Procedures  . CT ABDOMEN PELVIS W CONTRAST    Standing Status:   Future    Standing Expiration Date:   09/29/2018    Order Specific Question:   If indicated for the ordered procedure, I authorize the  administration of contrast media per Radiology protocol    Answer:   Yes    Order Specific Question:   Preferred imaging location?    Answer:   [facility]    Order Specific Question:   Radiology Contrast Protocol - do NOT remove file path    Answer:   \\[email or web address]    Order Specific Question:   ** REASON FOR EXAM (FREE TEXT)    Answer:   Lung cancer. Restaging.  . CT CHEST W CONTRAST    Standing Status:   Future    Standing Expiration Date:   09/29/2018  Order Specific Question:   If indicated for the ordered procedure, I authorize the administration of contrast media per Radiology protocol    Answer:   Yes    Order Specific Question:   Preferred imaging location?    Answer:   [facility]    Order Specific Question:   Radiology Contrast Protocol - do NOT remove file path    Answer:   \\[email or web address]    Order Specific Question:   ** REASON FOR EXAM (FREE TEXT)    Answer:   Lung cancer. Restaging.   Mikey Bussing, DNP, AGPCNP-BC, AOCNP 09/28/17

## 2017-09-28 ENCOUNTER — Encounter: Payer: Self-pay | Admitting: Oncology

## 2017-09-28 ENCOUNTER — Inpatient Hospital Stay (HOSPITAL_BASED_OUTPATIENT_CLINIC_OR_DEPARTMENT_OTHER): Payer: PPO | Admitting: Oncology

## 2017-09-28 ENCOUNTER — Inpatient Hospital Stay: Payer: PPO

## 2017-09-28 ENCOUNTER — Other Ambulatory Visit: Payer: Self-pay

## 2017-09-28 VITALS — BP 130/74 | HR 92 | Temp 98.4°F | Resp 17 | Ht 64.75 in | Wt 201.6 lb

## 2017-09-28 DIAGNOSIS — Z79899 Other long term (current) drug therapy: Secondary | ICD-10-CM

## 2017-09-28 DIAGNOSIS — E039 Hypothyroidism, unspecified: Secondary | ICD-10-CM | POA: Diagnosis not present

## 2017-09-28 DIAGNOSIS — E785 Hyperlipidemia, unspecified: Secondary | ICD-10-CM

## 2017-09-28 DIAGNOSIS — E119 Type 2 diabetes mellitus without complications: Secondary | ICD-10-CM | POA: Diagnosis not present

## 2017-09-28 DIAGNOSIS — C7800 Secondary malignant neoplasm of unspecified lung: Secondary | ICD-10-CM

## 2017-09-28 DIAGNOSIS — C3411 Malignant neoplasm of upper lobe, right bronchus or lung: Secondary | ICD-10-CM

## 2017-09-28 DIAGNOSIS — E669 Obesity, unspecified: Secondary | ICD-10-CM | POA: Diagnosis not present

## 2017-09-28 DIAGNOSIS — C3491 Malignant neoplasm of unspecified part of right bronchus or lung: Secondary | ICD-10-CM

## 2017-09-28 DIAGNOSIS — Z95828 Presence of other vascular implants and grafts: Secondary | ICD-10-CM

## 2017-09-28 DIAGNOSIS — J9621 Acute and chronic respiratory failure with hypoxia: Secondary | ICD-10-CM

## 2017-09-28 DIAGNOSIS — Z5112 Encounter for antineoplastic immunotherapy: Secondary | ICD-10-CM

## 2017-09-28 DIAGNOSIS — I10 Essential (primary) hypertension: Secondary | ICD-10-CM

## 2017-09-28 DIAGNOSIS — Z5111 Encounter for antineoplastic chemotherapy: Secondary | ICD-10-CM

## 2017-09-28 DIAGNOSIS — F419 Anxiety disorder, unspecified: Secondary | ICD-10-CM

## 2017-09-28 DIAGNOSIS — Z8041 Family history of malignant neoplasm of ovary: Secondary | ICD-10-CM

## 2017-09-28 LAB — CBC WITH DIFFERENTIAL (CANCER CENTER ONLY)
BASOS ABS: 0 10*3/uL (ref 0.0–0.1)
Basophils Relative: 0 %
EOS ABS: 0.2 10*3/uL (ref 0.0–0.5)
EOS PCT: 2 %
HCT: 43.9 % (ref 34.8–46.6)
Hemoglobin: 13.8 g/dL (ref 11.6–15.9)
Lymphocytes Relative: 16 %
Lymphs Abs: 1.7 10*3/uL (ref 0.9–3.3)
MCH: 30.7 pg (ref 25.1–34.0)
MCHC: 31.4 g/dL — AB (ref 31.5–36.0)
MCV: 97.6 fL (ref 79.5–101.0)
Monocytes Absolute: 1.2 10*3/uL — ABNORMAL HIGH (ref 0.1–0.9)
Monocytes Relative: 11 %
Neutro Abs: 7.6 10*3/uL — ABNORMAL HIGH (ref 1.5–6.5)
Neutrophils Relative %: 71 %
Platelet Count: 302 10*3/uL (ref 145–400)
RBC: 4.5 MIL/uL (ref 3.70–5.45)
RDW: 17.7 % — AB (ref 11.2–14.5)
WBC: 10.7 10*3/uL — AB (ref 3.9–10.3)

## 2017-09-28 LAB — CMP (CANCER CENTER ONLY)
ALBUMIN: 3.1 g/dL — AB (ref 3.5–5.0)
ALK PHOS: 56 U/L (ref 38–126)
ALT: 13 U/L (ref 0–44)
ANION GAP: 10 (ref 5–15)
AST: 14 U/L — AB (ref 15–41)
BUN: 12 mg/dL (ref 8–23)
CALCIUM: 9.6 mg/dL (ref 8.9–10.3)
CO2: 34 mmol/L — ABNORMAL HIGH (ref 22–32)
Chloride: 97 mmol/L — ABNORMAL LOW (ref 98–111)
Creatinine: 0.8 mg/dL (ref 0.44–1.00)
GFR, Est AFR Am: >60 mL/min (ref >60)
GLUCOSE: 114 mg/dL — AB (ref 70–99)
POTASSIUM: 4 mmol/L (ref 3.5–5.1)
Sodium: 141 mmol/L (ref 135–145)
Total Bilirubin: 0.3 mg/dL (ref 0.3–1.2)
Total Protein: 6.9 g/dL (ref 6.5–8.1)

## 2017-09-28 MED ORDER — SODIUM CHLORIDE 0.9 % IV SOLN
490.0000 mg/m2 | Freq: Once | INTRAVENOUS | Status: AC
  Administered 2017-09-28: 1000 mg via INTRAVENOUS
  Filled 2017-09-28: qty 40

## 2017-09-28 MED ORDER — PALONOSETRON HCL INJECTION 0.25 MG/5ML
0.2500 mg | Freq: Once | INTRAVENOUS | Status: AC
  Administered 2017-09-28: 0.25 mg via INTRAVENOUS

## 2017-09-28 MED ORDER — PALONOSETRON HCL INJECTION 0.25 MG/5ML
INTRAVENOUS | Status: AC
  Filled 2017-09-28: qty 5

## 2017-09-28 MED ORDER — SODIUM CHLORIDE 0.9 % IV SOLN
489.5000 mg | Freq: Once | INTRAVENOUS | Status: AC
  Administered 2017-09-28: 490 mg via INTRAVENOUS
  Filled 2017-09-28: qty 49

## 2017-09-28 MED ORDER — HEPARIN SOD (PORK) LOCK FLUSH 100 UNIT/ML IV SOLN
500.0000 [IU] | Freq: Once | INTRAVENOUS | Status: AC | PRN
  Administered 2017-09-28: 500 [IU]
  Filled 2017-09-28: qty 5

## 2017-09-28 MED ORDER — CYANOCOBALAMIN 1000 MCG/ML IJ SOLN
1000.0000 ug | Freq: Once | INTRAMUSCULAR | Status: AC
  Administered 2017-09-28: 1000 ug via INTRAMUSCULAR

## 2017-09-28 MED ORDER — SODIUM CHLORIDE 0.9 % IV SOLN
200.0000 mg | Freq: Once | INTRAVENOUS | Status: AC
  Administered 2017-09-28: 200 mg via INTRAVENOUS
  Filled 2017-09-28: qty 8

## 2017-09-28 MED ORDER — SODIUM CHLORIDE 0.9 % IV SOLN
Freq: Once | INTRAVENOUS | Status: AC
Start: 2017-09-28 — End: 2017-09-28
  Administered 2017-09-28: 10:00:00 via INTRAVENOUS

## 2017-09-28 MED ORDER — ALTEPLASE 2 MG IJ SOLR
2.0000 mg | Freq: Once | INTRAMUSCULAR | Status: AC | PRN
  Administered 2017-09-28: 2 mg
  Filled 2017-09-28: qty 2

## 2017-09-28 MED ORDER — CYANOCOBALAMIN 1000 MCG/ML IJ SOLN
INTRAMUSCULAR | Status: AC
  Filled 2017-09-28: qty 1

## 2017-09-28 MED ORDER — ALTEPLASE 2 MG IJ SOLR
INTRAMUSCULAR | Status: AC
  Filled 2017-09-28: qty 2

## 2017-09-28 MED ORDER — ALBUTEROL SULFATE (2.5 MG/3ML) 0.083% IN NEBU
INHALATION_SOLUTION | RESPIRATORY_TRACT | Status: AC
  Filled 2017-09-28: qty 3

## 2017-09-28 MED ORDER — SODIUM CHLORIDE 0.9 % IV SOLN
Freq: Once | INTRAVENOUS | Status: AC
  Administered 2017-09-28: 10:00:00 via INTRAVENOUS
  Filled 2017-09-28: qty 5

## 2017-09-28 MED ORDER — SODIUM CHLORIDE 0.9% FLUSH
10.0000 mL | INTRAVENOUS | Status: DC | PRN
  Administered 2017-09-28: 10 mL
  Filled 2017-09-28: qty 10

## 2017-09-28 MED ORDER — ALBUTEROL SULFATE (2.5 MG/3ML) 0.083% IN NEBU
2.5000 mg | INHALATION_SOLUTION | Freq: Once | RESPIRATORY_TRACT | Status: AC
  Administered 2017-09-28: 2.5 mg via RESPIRATORY_TRACT
  Filled 2017-09-28: qty 3

## 2017-09-28 MED ORDER — SODIUM CHLORIDE 0.9% FLUSH
10.0000 mL | INTRAVENOUS | Status: DC | PRN
  Administered 2017-09-28: 10 mL via INTRAVENOUS
  Filled 2017-09-28: qty 10

## 2017-09-28 NOTE — Patient Instructions (Signed)
[facility] Discharge Instructions for Patients Receiving Chemotherapy  Today you received the following chemotherapy agents Keytruda, Alimta, and Carboplatin  To help prevent nausea and vomiting after your treatment, we encourage you to take your nausea medication as directed.  If you develop nausea and vomiting that is not controlled by your nausea medication, call the clinic.   BELOW ARE SYMPTOMS THAT SHOULD BE REPORTED IMMEDIATELY:  *FEVER GREATER THAN 100.5 F  *CHILLS WITH OR WITHOUT FEVER  NAUSEA AND VOMITING THAT IS NOT CONTROLLED WITH YOUR NAUSEA MEDICATION  *UNUSUAL SHORTNESS OF BREATH  *UNUSUAL BRUISING OR BLEEDING  TENDERNESS IN MOUTH AND THROAT WITH OR WITHOUT PRESENCE OF ULCERS  *URINARY PROBLEMS  *BOWEL PROBLEMS  UNUSUAL RASH Items with * indicate a potential emergency and should be followed up as soon as possible.  Feel free to call the clinic should you have any questions or concerns. The clinic phone number is [phone].  Please show the CHEMO ALERT CARD at check-in to the Emergency Department and triage nurse.   

## 2017-10-03 ENCOUNTER — Telehealth: Payer: Self-pay | Admitting: Medical Oncology

## 2017-10-03 NOTE — Telephone Encounter (Signed)
She may need to reach out to her primary care physician for this problem.

## 2017-10-03 NOTE — Telephone Encounter (Signed)
Coughing > "pulled muscle" - pt requests muscle relaxant.

## 2017-10-03 NOTE — Telephone Encounter (Signed)
Pt.notified

## 2017-10-04 ENCOUNTER — Other Ambulatory Visit: Payer: Self-pay | Admitting: Oncology

## 2017-10-05 ENCOUNTER — Inpatient Hospital Stay: Payer: PPO

## 2017-10-05 DIAGNOSIS — C3491 Malignant neoplasm of unspecified part of right bronchus or lung: Secondary | ICD-10-CM

## 2017-10-05 DIAGNOSIS — Z5111 Encounter for antineoplastic chemotherapy: Secondary | ICD-10-CM | POA: Diagnosis not present

## 2017-10-05 DIAGNOSIS — Z95828 Presence of other vascular implants and grafts: Secondary | ICD-10-CM

## 2017-10-05 LAB — CMP (CANCER CENTER ONLY)
ALK PHOS: 51 U/L (ref 38–126)
ALT: 13 U/L (ref 0–44)
AST: 15 U/L (ref 15–41)
Albumin: 3.3 g/dL — ABNORMAL LOW (ref 3.5–5.0)
Anion gap: 10 (ref 5–15)
BILIRUBIN TOTAL: 0.4 mg/dL (ref 0.3–1.2)
BUN: 22 mg/dL (ref 8–23)
CALCIUM: 9.6 mg/dL (ref 8.9–10.3)
CO2: 33 mmol/L — ABNORMAL HIGH (ref 22–32)
CREATININE: 0.77 mg/dL (ref 0.44–1.00)
Chloride: 95 mmol/L — ABNORMAL LOW (ref 98–111)
Glucose, Bld: 123 mg/dL — ABNORMAL HIGH (ref 70–99)
Potassium: 3.7 mmol/L (ref 3.5–5.1)
Sodium: 138 mmol/L (ref 135–145)
TOTAL PROTEIN: 6.7 g/dL (ref 6.5–8.1)

## 2017-10-05 LAB — CBC WITH DIFFERENTIAL (CANCER CENTER ONLY)
Basophils Absolute: 0 10*3/uL (ref 0.0–0.1)
Basophils Relative: 1 %
Eosinophils Absolute: 0.1 10*3/uL (ref 0.0–0.5)
Eosinophils Relative: 1 %
HCT: 40.5 % (ref 34.8–46.6)
HEMOGLOBIN: 13.2 g/dL (ref 11.6–15.9)
LYMPHS ABS: 1.3 10*3/uL (ref 0.9–3.3)
LYMPHS PCT: 23 %
MCH: 31.1 pg (ref 25.1–34.0)
MCHC: 32.6 g/dL (ref 31.5–36.0)
MCV: 95.3 fL (ref 79.5–101.0)
MONOS PCT: 9 %
Monocytes Absolute: 0.5 10*3/uL (ref 0.1–0.9)
NEUTROS PCT: 66 %
Neutro Abs: 3.7 10*3/uL (ref 1.5–6.5)
Platelet Count: 104 10*3/uL — ABNORMAL LOW (ref 145–400)
RBC: 4.25 MIL/uL (ref 3.70–5.45)
RDW: 17 % — ABNORMAL HIGH (ref 11.2–14.5)
WBC: 5.6 10*3/uL (ref 3.9–10.3)

## 2017-10-05 MED ORDER — HEPARIN SOD (PORK) LOCK FLUSH 100 UNIT/ML IV SOLN
500.0000 [IU] | Freq: Once | INTRAVENOUS | Status: AC | PRN
  Administered 2017-10-05: 500 [IU]
  Filled 2017-10-05: qty 5

## 2017-10-05 MED ORDER — SODIUM CHLORIDE 0.9% FLUSH
10.0000 mL | INTRAVENOUS | Status: DC | PRN
  Administered 2017-10-05: 10 mL
  Filled 2017-10-05: qty 10

## 2017-10-06 ENCOUNTER — Telehealth: Payer: Self-pay | Admitting: Internal Medicine

## 2017-10-06 DIAGNOSIS — J449 Chronic obstructive pulmonary disease, unspecified: Secondary | ICD-10-CM | POA: Diagnosis not present

## 2017-10-06 NOTE — Telephone Encounter (Signed)
Called regarding 8/29

## 2017-10-12 ENCOUNTER — Inpatient Hospital Stay: Payer: PPO | Attending: Internal Medicine

## 2017-10-12 ENCOUNTER — Inpatient Hospital Stay: Payer: PPO

## 2017-10-12 DIAGNOSIS — I2510 Atherosclerotic heart disease of native coronary artery without angina pectoris: Secondary | ICD-10-CM | POA: Diagnosis not present

## 2017-10-12 DIAGNOSIS — C7801 Secondary malignant neoplasm of right lung: Secondary | ICD-10-CM | POA: Diagnosis not present

## 2017-10-12 DIAGNOSIS — Z5112 Encounter for antineoplastic immunotherapy: Secondary | ICD-10-CM | POA: Diagnosis not present

## 2017-10-12 DIAGNOSIS — E785 Hyperlipidemia, unspecified: Secondary | ICD-10-CM | POA: Insufficient documentation

## 2017-10-12 DIAGNOSIS — C3411 Malignant neoplasm of upper lobe, right bronchus or lung: Secondary | ICD-10-CM | POA: Insufficient documentation

## 2017-10-12 DIAGNOSIS — Z8543 Personal history of malignant neoplasm of ovary: Secondary | ICD-10-CM | POA: Diagnosis not present

## 2017-10-12 DIAGNOSIS — I2699 Other pulmonary embolism without acute cor pulmonale: Secondary | ICD-10-CM | POA: Diagnosis not present

## 2017-10-12 DIAGNOSIS — G4730 Sleep apnea, unspecified: Secondary | ICD-10-CM | POA: Insufficient documentation

## 2017-10-12 DIAGNOSIS — Z5111 Encounter for antineoplastic chemotherapy: Secondary | ICD-10-CM | POA: Diagnosis not present

## 2017-10-12 DIAGNOSIS — F419 Anxiety disorder, unspecified: Secondary | ICD-10-CM | POA: Insufficient documentation

## 2017-10-12 DIAGNOSIS — C3491 Malignant neoplasm of unspecified part of right bronchus or lung: Secondary | ICD-10-CM

## 2017-10-12 DIAGNOSIS — I700 Atherosclerosis of aorta: Secondary | ICD-10-CM | POA: Diagnosis not present

## 2017-10-12 DIAGNOSIS — C7802 Secondary malignant neoplasm of left lung: Secondary | ICD-10-CM | POA: Insufficient documentation

## 2017-10-12 DIAGNOSIS — Z79899 Other long term (current) drug therapy: Secondary | ICD-10-CM | POA: Insufficient documentation

## 2017-10-12 DIAGNOSIS — E119 Type 2 diabetes mellitus without complications: Secondary | ICD-10-CM | POA: Diagnosis not present

## 2017-10-12 DIAGNOSIS — E039 Hypothyroidism, unspecified: Secondary | ICD-10-CM | POA: Insufficient documentation

## 2017-10-12 DIAGNOSIS — Z7901 Long term (current) use of anticoagulants: Secondary | ICD-10-CM | POA: Diagnosis not present

## 2017-10-12 DIAGNOSIS — E669 Obesity, unspecified: Secondary | ICD-10-CM | POA: Diagnosis not present

## 2017-10-12 DIAGNOSIS — Z95828 Presence of other vascular implants and grafts: Secondary | ICD-10-CM

## 2017-10-12 DIAGNOSIS — I10 Essential (primary) hypertension: Secondary | ICD-10-CM | POA: Insufficient documentation

## 2017-10-12 LAB — CBC WITH DIFFERENTIAL (CANCER CENTER ONLY)
Basophils Absolute: 0 10*3/uL (ref 0.0–0.1)
Basophils Relative: 0 %
EOS ABS: 0.2 10*3/uL (ref 0.0–0.5)
Eosinophils Relative: 3 %
HCT: 38.7 % (ref 34.8–46.6)
Hemoglobin: 12.4 g/dL (ref 11.6–15.9)
Lymphocytes Relative: 21 %
Lymphs Abs: 1.5 10*3/uL (ref 0.9–3.3)
MCH: 31.2 pg (ref 25.1–34.0)
MCHC: 32 g/dL (ref 31.5–36.0)
MCV: 97.2 fL (ref 79.5–101.0)
MONO ABS: 0.6 10*3/uL (ref 0.1–0.9)
MONOS PCT: 8 %
Neutro Abs: 4.7 10*3/uL (ref 1.5–6.5)
Neutrophils Relative %: 68 %
PLATELETS: 150 10*3/uL (ref 145–400)
RBC: 3.98 MIL/uL (ref 3.70–5.45)
RDW: 18.1 % — AB (ref 11.2–14.5)
WBC Count: 7 10*3/uL (ref 3.9–10.3)

## 2017-10-12 LAB — CMP (CANCER CENTER ONLY)
ALK PHOS: 58 U/L (ref 38–126)
ALT: 14 U/L (ref 0–44)
AST: 13 U/L — ABNORMAL LOW (ref 15–41)
Albumin: 3.3 g/dL — ABNORMAL LOW (ref 3.5–5.0)
Anion gap: 10 (ref 5–15)
BILIRUBIN TOTAL: 0.5 mg/dL (ref 0.3–1.2)
BUN: 10 mg/dL (ref 8–23)
CO2: 33 mmol/L — AB (ref 22–32)
CREATININE: 0.71 mg/dL (ref 0.44–1.00)
Calcium: 9.4 mg/dL (ref 8.9–10.3)
Chloride: 98 mmol/L (ref 98–111)
GFR, Estimated: >60 mL/min (ref >60)
GLUCOSE: 93 mg/dL (ref 70–99)
Potassium: 3.6 mmol/L (ref 3.5–5.1)
SODIUM: 141 mmol/L (ref 135–145)
Total Protein: 6.8 g/dL (ref 6.5–8.1)

## 2017-10-12 LAB — TSH: TSH: 5.393 u[IU]/mL — ABNORMAL HIGH (ref 0.308–3.960)

## 2017-10-12 MED ORDER — HEPARIN SOD (PORK) LOCK FLUSH 100 UNIT/ML IV SOLN
500.0000 [IU] | Freq: Once | INTRAVENOUS | Status: AC | PRN
  Administered 2017-10-12: 500 [IU]
  Filled 2017-10-12: qty 5

## 2017-10-12 MED ORDER — SODIUM CHLORIDE 0.9% FLUSH
10.0000 mL | INTRAVENOUS | Status: DC | PRN
  Administered 2017-10-12: 10 mL
  Filled 2017-10-12: qty 10

## 2017-10-16 ENCOUNTER — Encounter (HOSPITAL_COMMUNITY): Payer: Self-pay

## 2017-10-16 ENCOUNTER — Other Ambulatory Visit: Payer: Self-pay | Admitting: Medical Oncology

## 2017-10-16 ENCOUNTER — Telehealth: Payer: Self-pay | Admitting: Medical Oncology

## 2017-10-16 ENCOUNTER — Ambulatory Visit (HOSPITAL_COMMUNITY)
Admission: RE | Admit: 2017-10-16 | Discharge: 2017-10-16 | Disposition: A | Discharge status: Home / Self Care | Payer: PPO | Source: Ambulatory Visit | Attending: Oncology | Admitting: Oncology

## 2017-10-16 DIAGNOSIS — I2699 Other pulmonary embolism without acute cor pulmonale: Secondary | ICD-10-CM | POA: Diagnosis not present

## 2017-10-16 DIAGNOSIS — C3491 Malignant neoplasm of unspecified part of right bronchus or lung: Secondary | ICD-10-CM | POA: Insufficient documentation

## 2017-10-16 DIAGNOSIS — C3490 Malignant neoplasm of unspecified part of unspecified bronchus or lung: Secondary | ICD-10-CM | POA: Diagnosis not present

## 2017-10-16 MED ORDER — RIVAROXABAN (XARELTO) VTE STARTER PACK (15 & 20 MG): ORAL_TABLET | ORAL | 0 refills | Status: DC

## 2017-10-16 MED ORDER — IOPAMIDOL (ISOVUE-300) INJECTION 61%
INTRAVENOUS | Status: AC
Start: 2017-10-16 — End: 2017-10-16
  Filled 2017-10-16: qty 100

## 2017-10-16 MED ORDER — IOPAMIDOL (ISOVUE-300) INJECTION 61%
100.0000 mL | Freq: Once | INTRAVENOUS | Status: AC | PRN
  Administered 2017-10-16: 100 mL via INTRAVENOUS

## 2017-10-16 NOTE — Telephone Encounter (Addendum)
Radiologist called -"Small bilateral PE incidentally noted on CT scan". Mohamed notified.

## 2017-10-16 NOTE — Telephone Encounter (Signed)
Called in [place] started pack.

## 2017-10-16 NOTE — Telephone Encounter (Signed)
PEr Dr [name], pt notified of small bilateral pulmonary emboli on CT chest. Xarelto ordered and pt notified.

## 2017-10-18 ENCOUNTER — Telehealth: Payer: Self-pay | Admitting: Pulmonary Disease

## 2017-10-18 NOTE — Telephone Encounter (Signed)
Called and spoke with patient regarding O2 at 3L con't. Pt has a POC at 3L con't - doesn't last long at all for appts Pt states that her DME-[facility] is in need of new script for pulse POC Advised pt that she was last qualified on POC was 06/30/17 on 2L con't She will need to be requalified for pulse POC in order to place order Pt advised she will call back to schedule 58min qualifying walk appt later in few weeks when feeling better from chemo Closing message in triage at this time.  Routing message to [place] to f/u with pt in 2 weeks.

## 2017-10-19 ENCOUNTER — Inpatient Hospital Stay (HOSPITAL_BASED_OUTPATIENT_CLINIC_OR_DEPARTMENT_OTHER): Payer: PPO | Admitting: Internal Medicine

## 2017-10-19 ENCOUNTER — Inpatient Hospital Stay: Payer: PPO

## 2017-10-19 ENCOUNTER — Encounter: Payer: Self-pay | Admitting: Internal Medicine

## 2017-10-19 ENCOUNTER — Encounter: Payer: Self-pay | Admitting: *Deleted

## 2017-10-19 ENCOUNTER — Telehealth: Payer: Self-pay | Admitting: Internal Medicine

## 2017-10-19 VITALS — BP 109/55 | HR 80 | Temp 98.2°F | Resp 16 | Ht 64.75 in | Wt 198.8 lb

## 2017-10-19 DIAGNOSIS — C7801 Secondary malignant neoplasm of right lung: Secondary | ICD-10-CM | POA: Diagnosis not present

## 2017-10-19 DIAGNOSIS — Z7901 Long term (current) use of anticoagulants: Secondary | ICD-10-CM

## 2017-10-19 DIAGNOSIS — I700 Atherosclerosis of aorta: Secondary | ICD-10-CM

## 2017-10-19 DIAGNOSIS — G4730 Sleep apnea, unspecified: Secondary | ICD-10-CM

## 2017-10-19 DIAGNOSIS — E669 Obesity, unspecified: Secondary | ICD-10-CM

## 2017-10-19 DIAGNOSIS — C3411 Malignant neoplasm of upper lobe, right bronchus or lung: Secondary | ICD-10-CM

## 2017-10-19 DIAGNOSIS — E785 Hyperlipidemia, unspecified: Secondary | ICD-10-CM

## 2017-10-19 DIAGNOSIS — C3491 Malignant neoplasm of unspecified part of right bronchus or lung: Secondary | ICD-10-CM

## 2017-10-19 DIAGNOSIS — Z8543 Personal history of malignant neoplasm of ovary: Secondary | ICD-10-CM

## 2017-10-19 DIAGNOSIS — F419 Anxiety disorder, unspecified: Secondary | ICD-10-CM | POA: Diagnosis not present

## 2017-10-19 DIAGNOSIS — E119 Type 2 diabetes mellitus without complications: Secondary | ICD-10-CM | POA: Diagnosis not present

## 2017-10-19 DIAGNOSIS — I2510 Atherosclerotic heart disease of native coronary artery without angina pectoris: Secondary | ICD-10-CM

## 2017-10-19 DIAGNOSIS — I10 Essential (primary) hypertension: Secondary | ICD-10-CM | POA: Diagnosis not present

## 2017-10-19 DIAGNOSIS — Z95828 Presence of other vascular implants and grafts: Secondary | ICD-10-CM

## 2017-10-19 DIAGNOSIS — I2699 Other pulmonary embolism without acute cor pulmonale: Secondary | ICD-10-CM

## 2017-10-19 DIAGNOSIS — C7802 Secondary malignant neoplasm of left lung: Secondary | ICD-10-CM | POA: Diagnosis not present

## 2017-10-19 DIAGNOSIS — Z5111 Encounter for antineoplastic chemotherapy: Secondary | ICD-10-CM | POA: Diagnosis not present

## 2017-10-19 DIAGNOSIS — Z79899 Other long term (current) drug therapy: Secondary | ICD-10-CM

## 2017-10-19 DIAGNOSIS — E039 Hypothyroidism, unspecified: Secondary | ICD-10-CM

## 2017-10-19 DIAGNOSIS — Z5112 Encounter for antineoplastic immunotherapy: Secondary | ICD-10-CM

## 2017-10-19 LAB — CBC WITH DIFFERENTIAL (CANCER CENTER ONLY)
BASOS PCT: 1 %
Basophils Absolute: 0 10*3/uL (ref 0.0–0.1)
EOS PCT: 4 %
Eosinophils Absolute: 0.2 10*3/uL (ref 0.0–0.5)
HCT: 40.5 % (ref 34.8–46.6)
Hemoglobin: 12.8 g/dL (ref 11.6–15.9)
LYMPHS ABS: 1.7 10*3/uL (ref 0.9–3.3)
Lymphocytes Relative: 26 %
MCH: 31.5 pg (ref 25.1–34.0)
MCHC: 31.6 g/dL (ref 31.5–36.0)
MCV: 99.8 fL (ref 79.5–101.0)
MONOS PCT: 15 %
Monocytes Absolute: 0.9 10*3/uL (ref 0.1–0.9)
Neutro Abs: 3.6 10*3/uL (ref 1.5–6.5)
Neutrophils Relative %: 54 %
PLATELETS: 231 10*3/uL (ref 145–400)
RBC: 4.06 MIL/uL (ref 3.70–5.45)
RDW: 19.2 % — ABNORMAL HIGH (ref 11.2–14.5)
WBC: 6.5 10*3/uL (ref 3.9–10.3)

## 2017-10-19 LAB — CMP (CANCER CENTER ONLY)
ALBUMIN: 3.1 g/dL — AB (ref 3.5–5.0)
ALK PHOS: 55 U/L (ref 38–126)
ALT: 16 U/L (ref 0–44)
AST: 19 U/L (ref 15–41)
Anion gap: 10 (ref 5–15)
BUN: 7 mg/dL — AB (ref 8–23)
CO2: 32 mmol/L (ref 22–32)
CREATININE: 0.79 mg/dL (ref 0.44–1.00)
Calcium: 8.8 mg/dL — ABNORMAL LOW (ref 8.9–10.3)
Chloride: 99 mmol/L (ref 98–111)
GFR, Est AFR Am: >60 mL/min (ref >60)
GFR, Estimated: >60 mL/min (ref >60)
GLUCOSE: 106 mg/dL — AB (ref 70–99)
Potassium: 3.5 mmol/L (ref 3.5–5.1)
Sodium: 141 mmol/L (ref 135–145)
Total Bilirubin: 0.4 mg/dL (ref 0.3–1.2)
Total Protein: 6.6 g/dL (ref 6.5–8.1)

## 2017-10-19 MED ORDER — SODIUM CHLORIDE 0.9 % IV SOLN
Freq: Once | INTRAVENOUS | Status: AC
  Administered 2017-10-19: 11:00:00 via INTRAVENOUS
  Filled 2017-10-19: qty 250

## 2017-10-19 MED ORDER — PALONOSETRON HCL INJECTION 0.25 MG/5ML
INTRAVENOUS | Status: AC
  Filled 2017-10-19: qty 5

## 2017-10-19 MED ORDER — SODIUM CHLORIDE 0.9% FLUSH
10.0000 mL | INTRAVENOUS | Status: DC | PRN
  Filled 2017-10-19: qty 10

## 2017-10-19 MED ORDER — CARBOPLATIN CHEMO INJECTION 600 MG/60ML
489.5000 mg | Freq: Once | INTRAVENOUS | Status: AC
  Administered 2017-10-19: 490 mg via INTRAVENOUS
  Filled 2017-10-19: qty 49

## 2017-10-19 MED ORDER — HEPARIN SOD (PORK) LOCK FLUSH 100 UNIT/ML IV SOLN
500.0000 [IU] | Freq: Once | INTRAVENOUS | Status: DC | PRN
  Filled 2017-10-19: qty 5

## 2017-10-19 MED ORDER — SODIUM CHLORIDE 0.9% FLUSH
10.0000 mL | INTRAVENOUS | Status: DC | PRN
  Administered 2017-10-19: 10 mL
  Filled 2017-10-19: qty 10

## 2017-10-19 MED ORDER — SODIUM CHLORIDE 0.9 % IV SOLN
Freq: Once | INTRAVENOUS | Status: AC
  Administered 2017-10-19: 12:00:00 via INTRAVENOUS
  Filled 2017-10-19: qty 5

## 2017-10-19 MED ORDER — PEMETREXED DISODIUM CHEMO INJECTION 500 MG
490.0000 mg/m2 | Freq: Once | INTRAVENOUS | Status: AC
  Administered 2017-10-19: 1000 mg via INTRAVENOUS
  Filled 2017-10-19: qty 40

## 2017-10-19 MED ORDER — PALONOSETRON HCL INJECTION 0.25 MG/5ML
0.2500 mg | Freq: Once | INTRAVENOUS | Status: AC
  Administered 2017-10-19: 0.25 mg via INTRAVENOUS

## 2017-10-19 MED ORDER — PEMBROLIZUMAB CHEMO INJECTION 100 MG/4ML
200.0000 mg | Freq: Once | INTRAVENOUS | Status: AC
  Administered 2017-10-19: 200 mg via INTRAVENOUS
  Filled 2017-10-19: qty 8

## 2017-10-19 NOTE — Patient Instructions (Signed)
[facility] Discharge Instructions for Patients Receiving Chemotherapy  Today you received the following chemotherapy agents Keytruda, Alimta, and Carboplatin  To help prevent nausea and vomiting after your treatment, we encourage you to take your nausea medication as directed.  If you develop nausea and vomiting that is not controlled by your nausea medication, call the clinic.   BELOW ARE SYMPTOMS THAT SHOULD BE REPORTED IMMEDIATELY:  *FEVER GREATER THAN 100.5 F  *CHILLS WITH OR WITHOUT FEVER  NAUSEA AND VOMITING THAT IS NOT CONTROLLED WITH YOUR NAUSEA MEDICATION  *UNUSUAL SHORTNESS OF BREATH  *UNUSUAL BRUISING OR BLEEDING  TENDERNESS IN MOUTH AND THROAT WITH OR WITHOUT PRESENCE OF ULCERS  *URINARY PROBLEMS  *BOWEL PROBLEMS  UNUSUAL RASH Items with * indicate a potential emergency and should be followed up as soon as possible.  Feel free to call the clinic should you have any questions or concerns. The clinic phone number is [phone].  Please show the CHEMO ALERT CARD at check-in to the Emergency Department and triage nurse.   

## 2017-10-19 NOTE — Telephone Encounter (Signed)
No additional appts added per 8/8 los - treatments already scheduled out per treatment plan.

## 2017-10-19 NOTE — Progress Notes (Signed)
Oncology Nurse Navigator Documentation  Oncology Nurse Navigator Flowsheets 10/19/2017  Navigator Location [facility]  Navigator Encounter Type Clinic/MDC/I spoke with patient today at clinic.  Barriers identified: educational needs on treatment plan. I gave and explain information.    Abnormal Finding Date 07/12/2017  Confirmed Diagnosis Date 07/20/2017  Treatment Initiated Date 08/18/2017  Patient Visit Type MedOnc  Treatment Phase Treatment  Barriers/Navigation Needs Education  Education Understanding Cancer/ Treatment Options  Interventions Education  Education Method Verbal;Written  Acuity Level 2  Acuity Level 2 Educational needs  Time Spent with Patient 30

## 2017-10-19 NOTE — Progress Notes (Signed)
[facility] [phone]   [phone]  OFFICE PROGRESS NOTE  [name], MD [place] [address]  DIAGNOSIS:  1) Stage IV (T3, N0, M1 a)non-small cell lung cancer, well-differentiated adenocarcinoma presented with multifocal disease involving mainly the right upper lobe as well as the right middle lobe and left lower lobe of the lung diagnosed in May 2019. 2) bilateral small pulmonary emboli diagnosed incidentally on CT scan of the chest on October 16, 2017  Guardant 360 testing was negative for actionable mutations.  PRIOR THERAPY:None  CURRENT THERAPY: 1) Carboplatin for an AUC of 5, Alimta 500 mg/m2 and Keytruda 200 mg IV given every 3 weeks. First dosegiven on 08/18/2017.Status post 3 cycles. 2) Xarelto 15 mg p.o. twice daily for the first 3 weeks followed by 20 mg p.o. Daily.  INTERVAL HISTORY: Erin Myers 77 y.o. female returns to the clinic today for follow-up visit accompanied by her husband.  The patient is feeling fine today except for the baseline shortness of breath and she is currently on home oxygen.  The patient denied having any significant chest pain but has mild dry cough with no hemoptysis.  She denied having any recent weight loss or night sweats.  She has no nausea, vomiting, diarrhea or constipation.  She denied having any fever or chills.  She continues to tolerate her treatment with carboplatin, Alimta and Keytruda fairly well.  She had repeat CT scan of the chest, abdomen and pelvis performed recently and she is here for evaluation and discussion of her risk her results.  MEDICAL HISTORY: Past Medical History:  Diagnosis Date  . Anxiety   . Diabetes mellitus without complication ([facility])   . Dyslipidemia   . HTN (hypertension)   . Hyperlipemia   . Hypothyroid   . Hypothyroidism   . Lung cancer ([facility]) 2000   Carcinoid, s/p RLL lobectomy  . Obesity   . Ovarian cancer ([facility])   .  Palpitations 2009 and 2006   Negative nuclear stress test   . Sleep apnea    on C-pap    ALLERGIES:  is allergic to adhesive [tape]; codeine; lactose intolerance (gi); monosodium glutamate; and sulfa antibiotics.  MEDICATIONS:  Current Outpatient Medications  Medication Sig Dispense Refill  . acetaminophen (TYLENOL) 500 MG tablet Take 500 mg by mouth every 6 (six) hours as needed.    . benzonatate (TESSALON) 100 MG capsule Take by mouth 3 (three) times daily as needed for cough.    . budesonide-formoterol (SYMBICORT) 160-4.5 MCG/ACT inhaler Inhale 2 puffs into the lungs 2 (two) times daily. 1 Inhaler 6  . clorazepate (TRANXENE) 7.5 MG tablet Take 7.5 mg by mouth 2 (two) times daily as needed for anxiety.    . folic acid (FOLVITE) 1 MG tablet TAKE 1 TABLET BY MOUTH DAILY. 30 tablet 2  . guaiFENesin-codeine (CHERATUSSIN AC) 100-10 MG/5ML syrup Take 5 mLs by mouth every 6 (six) hours as needed for cough. 240 mL 0  . ipratropium-albuterol (DUONEB) 0.5-2.5 (3) MG/3ML SOLN Take 3 mLs by nebulization 3 (three) times daily. 360 mL 2  . levothyroxine (SYNTHROID, LEVOTHROID) 112 MCG tablet Take 112 mcg by mouth daily before breakfast.    . lidocaine-prilocaine (EMLA) cream Apply 1 application topically as needed. (Patient not taking: Reported on 09/28/2017) 30 g 0  . losartan-hydrochlorothiazide (HYZAAR) 50-12.5 MG per tablet Take 1 tablet by mouth every morning.     . metoprolol succinate (TOPROL-XL) 50 MG 24 hr  tablet Take 50 mg by mouth 2 (two) times daily.     . predniSONE (DELTASONE) 10 MG tablet Take 1 tablet (10 mg total) by mouth daily with breakfast. Alternate 1 tab one day and 1/2 tab the next (Patient taking differently: Take 5-10 mg by mouth daily with breakfast. Alternate 10 mg tab one day and 5 mg tab the next) 30 tablet 0  . prochlorperazine (COMPAZINE) 10 MG tablet Take 1 tablet (10 mg total) by mouth every 6 (six) hours as needed for nausea or vomiting. (Patient not taking: Reported on  08/24/2017) 30 tablet 0  . Rivaroxaban 15 & 20 MG TBPK Take as directed on package: Start with one 15mg  tablet by mouth twice a day with food. On Day 22, switch to one 20mg  tablet once a day with food. 51 each 0  . rosuvastatin (CRESTOR) 10 MG tablet Take 10 mg by mouth every evening.     . valACYclovir (VALTREX) 1000 MG tablet Take 1,000 mg by mouth daily.  0   No current facility-administered medications for this visit.     SURGICAL HISTORY:  Past Surgical History:  Procedure Laterality Date  . ABDOMINAL HYSTERECTOMY  2001  . GRADED EXERCISE TOLERANCE TEST  08/2015    Blood pressure demonstrated a hypertensive response to exercise.  There was less than 0.51mm of horizontal ST segment depression in the inferolateral leads. No diagnostic criterial for ischemia. Moderately impaired exercise tolerance. The patient achieved 5.8 mets. This is a low risk study.  . IR FLUORO GUIDE PORT INSERTION RIGHT  08/17/2017  . IR US GUIDE VASC ACCESS RIGHT  08/17/2017  . LOBECTOMY  2000   RLL  . NM MYOCAR PERF WALL MOTION  08/20/2007   EF 74%  EXERCISE CAPACITY 7 METS  . OOPHORECTOMY  2001  . TONSILLECTOMY    . VIDEO BRONCHOSCOPY Bilateral 07/20/2017   Procedure: VIDEO BRONCHOSCOPY WITH FLUORO;  Surgeon: Collene Gobble, MD;  Location: [facility];  Service: Cardiopulmonary;  Laterality: Bilateral;    REVIEW OF SYSTEMS:  Constitutional: positive for fatigue Eyes: negative Ears, nose, mouth, throat, and face: negative Respiratory: positive for cough and dyspnea on exertion Cardiovascular: negative Gastrointestinal: negative Genitourinary:negative Integument/breast: negative Hematologic/lymphatic: negative Musculoskeletal:negative Neurological: negative Behavioral/Psych: negative Endocrine: negative Allergic/Immunologic: negative   PHYSICAL EXAMINATION: General appearance: alert, cooperative, fatigued and no distress Head: Normocephalic, without obvious abnormality, atraumatic Neck: no adenopathy,  no JVD, supple, symmetrical, trachea midline and thyroid not enlarged, symmetric, no tenderness/mass/nodules Lymph nodes: Cervical, supraclavicular, and axillary nodes normal. Resp: rales bilaterally and wheezes bilaterally Back: symmetric, no curvature. ROM normal. No CVA tenderness. Cardio: regular rate and rhythm, S1, S2 normal, no murmur, click, rub or gallop GI: soft, non-tender; bowel sounds normal; no masses,  no organomegaly Extremities: extremities normal, atraumatic, no cyanosis or edema Neurologic: Alert and oriented X 3, normal strength and tone. Normal symmetric reflexes. Normal coordination and gait  ECOG PERFORMANCE STATUS: 1 - Symptomatic but completely ambulatory  Blood pressure (!) 109/55, pulse 80, temperature 98.2 F (36.8 C), temperature source Oral, resp. rate 16, height 5' 4.75" (1.645 m), weight 198 lb 12.8 oz (90.2 kg), SpO2 92 %.  LABORATORY DATA: Lab Results  Component Value Date   WBC 6.5 10/19/2017   HGB 12.8 10/19/2017   HCT 40.5 10/19/2017   MCV 99.8 10/19/2017   PLT 231 10/19/2017      Chemistry      Component Value Date/Time   NA 141 10/19/2017 0917   K 3.5 10/19/2017 8119  CL 99 10/19/2017 0917   CO2 32 10/19/2017 0917   BUN 7 (L) 10/19/2017 0917   CREATININE 0.79 10/19/2017 0917      Component Value Date/Time   CALCIUM 8.8 (L) 10/19/2017 0917   ALKPHOS 55 10/19/2017 0917   AST 19 10/19/2017 0917   ALT 16 10/19/2017 0917   BILITOT 0.4 10/19/2017 0917       RADIOGRAPHIC STUDIES: Ct Chest W Contrast  Result Date: 10/16/2017 CLINICAL DATA:  Restaging non-small cell lung cancer. EXAM: CT CHEST, ABDOMEN, AND PELVIS WITH CONTRAST TECHNIQUE: Multidetector CT imaging of the chest, abdomen and pelvis was performed following the standard protocol during bolus administration of intravenous contrast. CONTRAST:  177mL ISOVUE-300 IOPAMIDOL (ISOVUE-300) INJECTION 61% COMPARISON:  PET-CT 08/09/2017 FINDINGS: CT CHEST FINDINGS Cardiovascular: The heart  is normal in size. No pericardial effusion. The aorta is normal in caliber. Stable atherosclerotic calcifications but no dissection and. The major branch vessels are patent. Scattered coronary artery calcifications. Small bilateral pulmonary emboli are noted. Mediastinum/Nodes: Enlarging mediastinal lymph nodes. 9.5 mm right paratracheal node on image number 18 previously measured 6.5 mm. 7 mm precarinal node on image number 21 previously measured 4.5 mm. 13.5 mm subcarinal lymph node on image number 28 previously measured 8.5 mm. Lungs/Pleura: Persistent dense ground-glass opacity and interstitial thickening in the right upper lobe with more overt airspace consolidation in the posterior aspect. The actual area of consolidated tumor appears larger measuring approximately 4.9 x 4.3 cm. On the prior chest CT this measured approximately 3.6 x 2.8 cm. Probable surrounding interstitial spread of tumor appears relatively stable. There is also persistent bilateral lower lobe peribronchovascular nodularity, bronchial wall thickening and interstitial thickening all highly suspicious for transbronchial and interstitial spread of tumor. This appears slightly progressive. Musculoskeletal: No chest wall masses or breast masses. No supraclavicular or axillary adenopathy. No significant bony findings. CT ABDOMEN PELVIS FINDINGS Hepatobiliary: No focal hepatic lesions or intrahepatic biliary dilatation. The gallbladder is normal. No common bile duct dilatation. Pancreas: No mass, inflammation or ductal dilatation. Spleen: Normal size.  No focal lesions. Adrenals/Urinary Tract: The adrenal glands and kidneys are unremarkable and stable. The bladder appears normal. Stomach/Bowel: The stomach, duodenum, small bowel and colon are unremarkable. No acute inflammatory changes, mass lesions or obstructive findings. Vascular/Lymphatic: Stable atherosclerotic calcifications involving the aorta and iliac arteries. No aneurysm or dissection.  The branch vessels are patent. The major venous structures are patent. Small scattered mesenteric and retroperitoneal lymph nodes appears stable. No new or progressive findings are demonstrated. No pelvic adenopathy. Reproductive: Surgically absent. Other: No pelvic mass or adenopathy. No free pelvic fluid collections. No inguinal mass or adenopathy. No abdominal wall hernia or subcutaneous lesions. Musculoskeletal: No significant bony findings. IMPRESSION: 1. Progressive solid consolidative part of the right upper lobe process suspicious for progressive solid tumor component. 2. Progressive appearing bilateral lower lobe process, likely interstitial and transbronchial spread of tumor. 3. Slight progression of mediastinal lymph nodes. 4. Small bilateral pulmonary emboli noted. 5. No findings for metastatic disease involving the abdomen/pelvis or bony structures. These results were called by telephone at the time of interpretation on 10/16/2017 at 1:56 pm to Dr. Earlie Server office. Electronically Signed   By: Marijo Sanes M.D.   On: 10/16/2017 13:56   Ct Abdomen Pelvis W Contrast  Result Date: 10/16/2017 CLINICAL DATA:  Restaging non-small cell lung cancer. EXAM: CT CHEST, ABDOMEN, AND PELVIS WITH CONTRAST TECHNIQUE: Multidetector CT imaging of the chest, abdomen and pelvis was performed following the standard protocol during bolus  administration of intravenous contrast. CONTRAST:  147mL ISOVUE-300 IOPAMIDOL (ISOVUE-300) INJECTION 61% COMPARISON:  PET-CT 08/09/2017 FINDINGS: CT CHEST FINDINGS Cardiovascular: The heart is normal in size. No pericardial effusion. The aorta is normal in caliber. Stable atherosclerotic calcifications but no dissection and. The major branch vessels are patent. Scattered coronary artery calcifications. Small bilateral pulmonary emboli are noted. Mediastinum/Nodes: Enlarging mediastinal lymph nodes. 9.5 mm right paratracheal node on image number 18 previously measured 6.5 mm. 7 mm precarinal  node on image number 21 previously measured 4.5 mm. 13.5 mm subcarinal lymph node on image number 28 previously measured 8.5 mm. Lungs/Pleura: Persistent dense ground-glass opacity and interstitial thickening in the right upper lobe with more overt airspace consolidation in the posterior aspect. The actual area of consolidated tumor appears larger measuring approximately 4.9 x 4.3 cm. On the prior chest CT this measured approximately 3.6 x 2.8 cm. Probable surrounding interstitial spread of tumor appears relatively stable. There is also persistent bilateral lower lobe peribronchovascular nodularity, bronchial wall thickening and interstitial thickening all highly suspicious for transbronchial and interstitial spread of tumor. This appears slightly progressive. Musculoskeletal: No chest wall masses or breast masses. No supraclavicular or axillary adenopathy. No significant bony findings. CT ABDOMEN PELVIS FINDINGS Hepatobiliary: No focal hepatic lesions or intrahepatic biliary dilatation. The gallbladder is normal. No common bile duct dilatation. Pancreas: No mass, inflammation or ductal dilatation. Spleen: Normal size.  No focal lesions. Adrenals/Urinary Tract: The adrenal glands and kidneys are unremarkable and stable. The bladder appears normal. Stomach/Bowel: The stomach, duodenum, small bowel and colon are unremarkable. No acute inflammatory changes, mass lesions or obstructive findings. Vascular/Lymphatic: Stable atherosclerotic calcifications involving the aorta and iliac arteries. No aneurysm or dissection. The branch vessels are patent. The major venous structures are patent. Small scattered mesenteric and retroperitoneal lymph nodes appears stable. No new or progressive findings are demonstrated. No pelvic adenopathy. Reproductive: Surgically absent. Other: No pelvic mass or adenopathy. No free pelvic fluid collections. No inguinal mass or adenopathy. No abdominal wall hernia or subcutaneous lesions.  Musculoskeletal: No significant bony findings. IMPRESSION: 1. Progressive solid consolidative part of the right upper lobe process suspicious for progressive solid tumor component. 2. Progressive appearing bilateral lower lobe process, likely interstitial and transbronchial spread of tumor. 3. Slight progression of mediastinal lymph nodes. 4. Small bilateral pulmonary emboli noted. 5. No findings for metastatic disease involving the abdomen/pelvis or bony structures. These results were called by telephone at the time of interpretation on 10/16/2017 at 1:56 pm to Dr. Earlie Server office. Electronically Signed   By: Marijo Sanes M.D.   On: 10/16/2017 13:56    ASSESSMENT AND PLAN: This is a very pleasant 77 years old white female with stage IV non-small cell lung cancer, adenocarcinoma with no actionable mutations. She is currently undergoing systemic chemotherapy with carboplatin, Alimta and Ketruda (pembrolizumab) status post 3 cycles. She has been tolerating this treatment fairly well. Repeat CT scan of the chest, abdomen and pelvis showed progression of the consolidative part of the right upper lobe process but the overall size of her disease is stable.  She also has mild increase in mediastinal lymphadenopathy. I personally and independently reviewed the scan images and discussed the results with the patient and her husband and showed them the images. I recommended for the patient to continue her current treatment with carboplatin, Alimta and Ketruda (pembrolizumab) for 3 more cycles.  I will monitor her closely for any other evidence of disease progression.  The changes seen on the recently scan could be  secondary to pseudo-progression or change in the configuration of her tumor. The patient and her husband agreed to the current plan and she will proceed with cycle #4 today. For the recently diagnosed the small bilateral pulmonary emboli, the patient was a started on treatment with Xarelto and currently on  15 mg p.o. twice daily for the first 3 weeks followed by 20 mg p.o. daily. The patient will come back for follow-up visit in 3 weeks for evaluation before starting cycle #5. The patient was advised to call immediately if she has any concerning symptoms in the interval. The patient voices understanding of current disease status and treatment options and is in agreement with the current care plan.  All questions were answered. The patient knows to call the clinic with any problems, questions or concerns. We can certainly see the patient much sooner if necessary.  I spent 20 minutes counseling the patient face to face. The total time spent in the appointment was 30 minutes.  Disclaimer: This note was dictated with voice recognition software. Similar sounding words can inadvertently be transcribed and may not be corrected upon review.

## 2017-10-24 DIAGNOSIS — G4733 Obstructive sleep apnea (adult) (pediatric): Secondary | ICD-10-CM | POA: Diagnosis not present

## 2017-10-26 ENCOUNTER — Inpatient Hospital Stay: Payer: PPO

## 2017-10-26 DIAGNOSIS — Z95828 Presence of other vascular implants and grafts: Secondary | ICD-10-CM

## 2017-10-26 DIAGNOSIS — Z5111 Encounter for antineoplastic chemotherapy: Secondary | ICD-10-CM | POA: Diagnosis not present

## 2017-10-26 DIAGNOSIS — C3491 Malignant neoplasm of unspecified part of right bronchus or lung: Secondary | ICD-10-CM

## 2017-10-26 LAB — CBC WITH DIFFERENTIAL (CANCER CENTER ONLY)
Basophils Absolute: 0 10*3/uL (ref 0.0–0.1)
Basophils Relative: 1 %
Eosinophils Absolute: 0 10*3/uL (ref 0.0–0.5)
Eosinophils Relative: 1 %
HCT: 37.4 % (ref 34.8–46.6)
Hemoglobin: 12 g/dL (ref 11.6–15.9)
LYMPHS PCT: 34 %
Lymphs Abs: 1 10*3/uL (ref 0.9–3.3)
MCH: 31.4 pg (ref 25.1–34.0)
MCHC: 32.1 g/dL (ref 31.5–36.0)
MCV: 97.9 fL (ref 79.5–101.0)
MONO ABS: 0.2 10*3/uL (ref 0.1–0.9)
MONOS PCT: 8 %
NEUTROS ABS: 1.7 10*3/uL (ref 1.5–6.5)
Neutrophils Relative %: 56 %
Platelet Count: 110 10*3/uL — ABNORMAL LOW (ref 145–400)
RBC: 3.82 MIL/uL (ref 3.70–5.45)
RDW: 18.1 % — AB (ref 11.2–14.5)
WBC Count: 3.1 10*3/uL — ABNORMAL LOW (ref 3.9–10.3)

## 2017-10-26 LAB — CMP (CANCER CENTER ONLY)
ALT: 14 U/L (ref 0–44)
ANION GAP: 10 (ref 5–15)
AST: 16 U/L (ref 15–41)
Albumin: 3.2 g/dL — ABNORMAL LOW (ref 3.5–5.0)
Alkaline Phosphatase: 47 U/L (ref 38–126)
BILIRUBIN TOTAL: 0.6 mg/dL (ref 0.3–1.2)
BUN: 17 mg/dL (ref 8–23)
CO2: 34 mmol/L — ABNORMAL HIGH (ref 22–32)
Calcium: 9.2 mg/dL (ref 8.9–10.3)
Chloride: 98 mmol/L (ref 98–111)
Creatinine: 0.77 mg/dL (ref 0.44–1.00)
GFR, Est AFR Am: >60 mL/min (ref >60)
Glucose, Bld: 95 mg/dL (ref 70–99)
POTASSIUM: 3.8 mmol/L (ref 3.5–5.1)
Sodium: 142 mmol/L (ref 135–145)
TOTAL PROTEIN: 6.7 g/dL (ref 6.5–8.1)

## 2017-10-26 MED ORDER — HEPARIN SOD (PORK) LOCK FLUSH 100 UNIT/ML IV SOLN
500.0000 [IU] | Freq: Once | INTRAVENOUS | Status: AC | PRN
  Administered 2017-10-26: 500 [IU]
  Filled 2017-10-26: qty 5

## 2017-10-26 MED ORDER — SODIUM CHLORIDE 0.9% FLUSH
10.0000 mL | INTRAVENOUS | Status: DC | PRN
  Administered 2017-10-26: 10 mL
  Filled 2017-10-26: qty 10

## 2017-11-02 ENCOUNTER — Inpatient Hospital Stay: Payer: PPO

## 2017-11-02 DIAGNOSIS — Z5111 Encounter for antineoplastic chemotherapy: Secondary | ICD-10-CM | POA: Diagnosis not present

## 2017-11-02 DIAGNOSIS — Z95828 Presence of other vascular implants and grafts: Secondary | ICD-10-CM

## 2017-11-02 DIAGNOSIS — Z5112 Encounter for antineoplastic immunotherapy: Secondary | ICD-10-CM

## 2017-11-02 DIAGNOSIS — C3491 Malignant neoplasm of unspecified part of right bronchus or lung: Secondary | ICD-10-CM

## 2017-11-02 LAB — CBC WITH DIFFERENTIAL (CANCER CENTER ONLY)
Basophils Absolute: 0 10*3/uL (ref 0.0–0.1)
Basophils Relative: 0 %
EOS ABS: 0.1 10*3/uL (ref 0.0–0.5)
Eosinophils Relative: 1 %
HCT: 33.7 % — ABNORMAL LOW (ref 34.8–46.6)
HEMOGLOBIN: 11 g/dL — AB (ref 11.6–15.9)
LYMPHS ABS: 1 10*3/uL (ref 0.9–3.3)
LYMPHS PCT: 27 %
MCH: 32 pg (ref 25.1–34.0)
MCHC: 32.6 g/dL (ref 31.5–36.0)
MCV: 98 fL (ref 79.5–101.0)
Monocytes Absolute: 0.4 10*3/uL (ref 0.1–0.9)
Monocytes Relative: 12 %
NEUTROS ABS: 2.2 10*3/uL (ref 1.5–6.5)
NEUTROS PCT: 60 %
Platelet Count: 100 10*3/uL — ABNORMAL LOW (ref 145–400)
RBC: 3.44 MIL/uL — AB (ref 3.70–5.45)
RDW: 17.9 % — ABNORMAL HIGH (ref 11.2–14.5)
WBC: 3.6 10*3/uL — AB (ref 3.9–10.3)

## 2017-11-02 LAB — CMP (CANCER CENTER ONLY)
ALT: 11 U/L (ref 0–44)
ANION GAP: 8 (ref 5–15)
AST: 14 U/L — ABNORMAL LOW (ref 15–41)
Albumin: 3.2 g/dL — ABNORMAL LOW (ref 3.5–5.0)
Alkaline Phosphatase: 54 U/L (ref 38–126)
BUN: 8 mg/dL (ref 8–23)
CHLORIDE: 100 mmol/L (ref 98–111)
CO2: 34 mmol/L — ABNORMAL HIGH (ref 22–32)
Calcium: 9.1 mg/dL (ref 8.9–10.3)
Creatinine: 0.78 mg/dL (ref 0.44–1.00)
GFR, Estimated: >60 mL/min (ref >60)
Glucose, Bld: 98 mg/dL (ref 70–99)
POTASSIUM: 3.4 mmol/L — AB (ref 3.5–5.1)
Sodium: 142 mmol/L (ref 135–145)
Total Bilirubin: 0.4 mg/dL (ref 0.3–1.2)
Total Protein: 6.8 g/dL (ref 6.5–8.1)

## 2017-11-02 LAB — TSH: TSH: 2.712 u[IU]/mL (ref 0.308–3.960)

## 2017-11-02 MED ORDER — HEPARIN SOD (PORK) LOCK FLUSH 100 UNIT/ML IV SOLN
500.0000 [IU] | Freq: Once | INTRAVENOUS | Status: AC | PRN
  Administered 2017-11-02: 500 [IU]
  Filled 2017-11-02: qty 5

## 2017-11-02 MED ORDER — SODIUM CHLORIDE 0.9% FLUSH
10.0000 mL | INTRAVENOUS | Status: DC | PRN
  Administered 2017-11-02: 10 mL
  Filled 2017-11-02: qty 10

## 2017-11-06 ENCOUNTER — Other Ambulatory Visit: Payer: Self-pay | Admitting: Internal Medicine

## 2017-11-06 ENCOUNTER — Telehealth: Payer: Self-pay | Admitting: *Deleted

## 2017-11-06 DIAGNOSIS — J449 Chronic obstructive pulmonary disease, unspecified: Secondary | ICD-10-CM | POA: Diagnosis not present

## 2017-11-06 DIAGNOSIS — I2699 Other pulmonary embolism without acute cor pulmonale: Secondary | ICD-10-CM

## 2017-11-06 NOTE — Telephone Encounter (Signed)
FYI Call received from Erin Myers in reference to port-a-cath.  "I was in last week for labs through port-a-cath.  The nurse had a hard time accessing it.  Said there was a blood clot in the middle of it.  I see a small knot in the middle of the three raised power port dots.  Should I be concerned or wait until I come in Thursday?" "No, the skin is intact, there is no redness, warmth, swelling or pain.  I do not have a fever.  My right arm has no swelling or pain either." "No trouble with blood return.  Inserting the needle was difficult, stinging, uncomfortable is why I'm calling.  I have had a blood clot in my arm and this is not a blood clot."  Asked to call if any changes or symptoms discussed with this call occur.

## 2017-11-07 ENCOUNTER — Telehealth: Payer: Self-pay | Admitting: Medical Oncology

## 2017-11-07 ENCOUNTER — Other Ambulatory Visit: Payer: Self-pay | Admitting: Medical Oncology

## 2017-11-07 DIAGNOSIS — I2699 Other pulmonary embolism without acute cor pulmonale: Secondary | ICD-10-CM

## 2017-11-07 MED ORDER — RIVAROXABAN 20 MG PO TABS: 20.0000 mg | ORAL_TABLET | Freq: Every day | ORAL | 1 refills | Status: DC

## 2017-11-07 NOTE — Telephone Encounter (Signed)
Pt r/s for flush

## 2017-11-07 NOTE — Telephone Encounter (Signed)
Pharmacist states pt completed starter pack and needs maintenance order. Xarelto 20 mg qd sent to pharmacy.

## 2017-11-08 ENCOUNTER — Other Ambulatory Visit: Payer: Self-pay | Admitting: Pulmonary Disease

## 2017-11-08 ENCOUNTER — Other Ambulatory Visit: Payer: Self-pay | Admitting: Internal Medicine

## 2017-11-08 NOTE — Telephone Encounter (Signed)
Followed up with pt. Spoke with her husband while she was doing a breathing treatment. States that they are going to keep things the way they are now with her oxygen. Apologized to them for the delay in follow up.

## 2017-11-09 ENCOUNTER — Encounter: Payer: Self-pay | Admitting: Oncology

## 2017-11-09 ENCOUNTER — Inpatient Hospital Stay: Payer: PPO

## 2017-11-09 ENCOUNTER — Telehealth: Payer: Self-pay | Admitting: Internal Medicine

## 2017-11-09 ENCOUNTER — Inpatient Hospital Stay (HOSPITAL_BASED_OUTPATIENT_CLINIC_OR_DEPARTMENT_OTHER): Payer: PPO | Admitting: Oncology

## 2017-11-09 VITALS — BP 127/52 | HR 89 | Temp 98.4°F | Resp 18 | Ht 64.75 in | Wt 195.0 lb

## 2017-11-09 DIAGNOSIS — Z5111 Encounter for antineoplastic chemotherapy: Secondary | ICD-10-CM

## 2017-11-09 DIAGNOSIS — E039 Hypothyroidism, unspecified: Secondary | ICD-10-CM

## 2017-11-09 DIAGNOSIS — Z7901 Long term (current) use of anticoagulants: Secondary | ICD-10-CM | POA: Diagnosis not present

## 2017-11-09 DIAGNOSIS — I10 Essential (primary) hypertension: Secondary | ICD-10-CM

## 2017-11-09 DIAGNOSIS — Z79899 Other long term (current) drug therapy: Secondary | ICD-10-CM

## 2017-11-09 DIAGNOSIS — E119 Type 2 diabetes mellitus without complications: Secondary | ICD-10-CM

## 2017-11-09 DIAGNOSIS — C3491 Malignant neoplasm of unspecified part of right bronchus or lung: Secondary | ICD-10-CM

## 2017-11-09 DIAGNOSIS — G4730 Sleep apnea, unspecified: Secondary | ICD-10-CM

## 2017-11-09 DIAGNOSIS — F419 Anxiety disorder, unspecified: Secondary | ICD-10-CM

## 2017-11-09 DIAGNOSIS — C7802 Secondary malignant neoplasm of left lung: Secondary | ICD-10-CM | POA: Diagnosis not present

## 2017-11-09 DIAGNOSIS — E785 Hyperlipidemia, unspecified: Secondary | ICD-10-CM | POA: Diagnosis not present

## 2017-11-09 DIAGNOSIS — Z5112 Encounter for antineoplastic immunotherapy: Secondary | ICD-10-CM

## 2017-11-09 DIAGNOSIS — Z8543 Personal history of malignant neoplasm of ovary: Secondary | ICD-10-CM

## 2017-11-09 DIAGNOSIS — C7801 Secondary malignant neoplasm of right lung: Secondary | ICD-10-CM

## 2017-11-09 DIAGNOSIS — E669 Obesity, unspecified: Secondary | ICD-10-CM

## 2017-11-09 DIAGNOSIS — I2699 Other pulmonary embolism without acute cor pulmonale: Secondary | ICD-10-CM

## 2017-11-09 DIAGNOSIS — Z95828 Presence of other vascular implants and grafts: Secondary | ICD-10-CM

## 2017-11-09 DIAGNOSIS — C3411 Malignant neoplasm of upper lobe, right bronchus or lung: Secondary | ICD-10-CM | POA: Diagnosis not present

## 2017-11-09 DIAGNOSIS — I2510 Atherosclerotic heart disease of native coronary artery without angina pectoris: Secondary | ICD-10-CM

## 2017-11-09 DIAGNOSIS — I700 Atherosclerosis of aorta: Secondary | ICD-10-CM

## 2017-11-09 LAB — CMP (CANCER CENTER ONLY)
ALT: 8 U/L (ref 0–44)
AST: 17 U/L (ref 15–41)
Albumin: 3.3 g/dL — ABNORMAL LOW (ref 3.5–5.0)
Alkaline Phosphatase: 55 U/L (ref 38–126)
Anion gap: 11 (ref 5–15)
BILIRUBIN TOTAL: 0.4 mg/dL (ref 0.3–1.2)
BUN: 10 mg/dL (ref 8–23)
CALCIUM: 9.6 mg/dL (ref 8.9–10.3)
CHLORIDE: 101 mmol/L (ref 98–111)
CO2: 31 mmol/L (ref 22–32)
CREATININE: 0.83 mg/dL (ref 0.44–1.00)
Glucose, Bld: 106 mg/dL — ABNORMAL HIGH (ref 70–99)
Potassium: 3.1 mmol/L — ABNORMAL LOW (ref 3.5–5.1)
Sodium: 143 mmol/L (ref 135–145)
TOTAL PROTEIN: 7.1 g/dL (ref 6.5–8.1)

## 2017-11-09 LAB — CBC WITH DIFFERENTIAL (CANCER CENTER ONLY)
BASOS PCT: 1 %
Basophils Absolute: 0 10*3/uL (ref 0.0–0.1)
EOS PCT: 2 %
Eosinophils Absolute: 0.1 10*3/uL (ref 0.0–0.5)
HEMATOCRIT: 35.8 % (ref 34.8–46.6)
Hemoglobin: 11.9 g/dL (ref 11.6–15.9)
Lymphocytes Relative: 23 %
Lymphs Abs: 1.1 10*3/uL (ref 0.9–3.3)
MCH: 32.2 pg (ref 25.1–34.0)
MCHC: 33.2 g/dL (ref 31.5–36.0)
MCV: 97 fL (ref 79.5–101.0)
MONO ABS: 0.7 10*3/uL (ref 0.1–0.9)
Monocytes Relative: 16 %
Neutro Abs: 2.7 10*3/uL (ref 1.5–6.5)
Neutrophils Relative %: 58 %
Platelet Count: 230 10*3/uL (ref 145–400)
RBC: 3.69 MIL/uL — AB (ref 3.70–5.45)
RDW: 20.7 % — ABNORMAL HIGH (ref 11.2–14.5)
WBC: 4.7 10*3/uL (ref 3.9–10.3)

## 2017-11-09 MED ORDER — POTASSIUM CHLORIDE CRYS ER 20 MEQ PO TBCR: 20.0000 meq | EXTENDED_RELEASE_TABLET | Freq: Every day | ORAL | 0 refills | Status: DC

## 2017-11-09 MED ORDER — SODIUM CHLORIDE 0.9 % IV SOLN
200.0000 mg | Freq: Once | INTRAVENOUS | Status: AC
  Administered 2017-11-09: 200 mg via INTRAVENOUS
  Filled 2017-11-09: qty 8

## 2017-11-09 MED ORDER — SODIUM CHLORIDE 0.9% FLUSH
10.0000 mL | INTRAVENOUS | Status: DC | PRN
  Administered 2017-11-09: 10 mL
  Filled 2017-11-09: qty 10

## 2017-11-09 MED ORDER — HEPARIN SOD (PORK) LOCK FLUSH 100 UNIT/ML IV SOLN
500.0000 [IU] | Freq: Once | INTRAVENOUS | Status: AC | PRN
  Administered 2017-11-09: 500 [IU]
  Filled 2017-11-09: qty 5

## 2017-11-09 MED ORDER — ONDANSETRON HCL 4 MG/2ML IJ SOLN
INTRAMUSCULAR | Status: AC
  Filled 2017-11-09: qty 2

## 2017-11-09 MED ORDER — SODIUM CHLORIDE 0.9 % IV SOLN
Freq: Once | INTRAVENOUS | Status: DC
  Filled 2017-11-09: qty 250

## 2017-11-09 MED ORDER — ONDANSETRON HCL 4 MG/2ML IJ SOLN
8.0000 mg | Freq: Once | INTRAMUSCULAR | Status: AC
  Administered 2017-11-09: 8 mg via INTRAVENOUS

## 2017-11-09 MED ORDER — SODIUM CHLORIDE 0.9 % IV SOLN
490.0000 mg/m2 | Freq: Once | INTRAVENOUS | Status: AC
  Administered 2017-11-09: 1000 mg via INTRAVENOUS
  Filled 2017-11-09: qty 40

## 2017-11-09 MED ORDER — DEXAMETHASONE SODIUM PHOSPHATE 10 MG/ML IJ SOLN
INTRAMUSCULAR | Status: AC
  Filled 2017-11-09: qty 1

## 2017-11-09 MED ORDER — DEXAMETHASONE SODIUM PHOSPHATE 10 MG/ML IJ SOLN
10.0000 mg | Freq: Once | INTRAMUSCULAR | Status: AC
  Administered 2017-11-09: 10 mg via INTRAVENOUS

## 2017-11-09 MED ORDER — ALBUTEROL SULFATE (2.5 MG/3ML) 0.083% IN NEBU
2.5000 mg | INHALATION_SOLUTION | Freq: Once | RESPIRATORY_TRACT | Status: DC
  Filled 2017-11-09: qty 3

## 2017-11-09 MED ORDER — SODIUM CHLORIDE 0.9 % IV SOLN
Freq: Once | INTRAVENOUS | Status: AC
  Administered 2017-11-09: 11:00:00 via INTRAVENOUS
  Filled 2017-11-09: qty 250

## 2017-11-09 NOTE — Telephone Encounter (Signed)
Appts scheduled / IB message sent to [facility] regarding f/u appt fpr 10/9 / AVS/ Calendar printed per 8/29 los

## 2017-11-09 NOTE — Assessment & Plan Note (Addendum)
This is a very pleasant 77 year old white female with stage IV non-small cell lung cancer, adenocarcinoma with no actionable mutations. She is currently undergoing systemic chemotherapy with carboplatin, Alimta and Ketruda (pembrolizumab) status post 4 cycles.  Beginning with cycle #5, the patient will receive Alimta and Keytruda only. She has been tolerating this treatment fairly well. Recommend for the patient to proceed with cycle 5 of her treatment today as scheduled.  She will know no longer need weekly labs and I have asked scheduling to cancel these.  The patient will follow-up in 3 weeks for evaluation prior to cycle #6 of her treatment.  For her pulmonary emboli, she will continue on Xarelto 20 milligrams daily.  For her decreased appetite and weight loss, I have referred her to the dietitian.  The patient was advised to call immediately if she has any concerning symptoms in the interval. The patient voices understanding of current disease status and treatment options and is in agreement with the current care plan.  All questions were answered. The patient knows to call the clinic with any problems, questions or concerns. We can certainly see the patient much sooner if necessary.

## 2017-11-09 NOTE — Patient Instructions (Signed)
[facility] Discharge Instructions for Patients Receiving Chemotherapy  Today you received the following chemotherapy agents alimtal and keytruda.  To help prevent nausea and vomiting after your treatment, we encourage you to take your nausea medication as directed.   If you develop nausea and vomiting that is not controlled by your nausea medication, call the clinic.   BELOW ARE SYMPTOMS THAT SHOULD BE REPORTED IMMEDIATELY:  *FEVER GREATER THAN 100.5 F  *CHILLS WITH OR WITHOUT FEVER  NAUSEA AND VOMITING THAT IS NOT CONTROLLED WITH YOUR NAUSEA MEDICATION  *UNUSUAL SHORTNESS OF BREATH  *UNUSUAL BRUISING OR BLEEDING  TENDERNESS IN MOUTH AND THROAT WITH OR WITHOUT PRESENCE OF ULCERS  *URINARY PROBLEMS  *BOWEL PROBLEMS  UNUSUAL RASH Items with * indicate a potential emergency and should be followed up as soon as possible.  Feel free to call the clinic should you have any questions or concerns. The clinic phone number is [phone].  Please show the [facility] at check-in to the Emergency Department and triage nurse.

## 2017-11-09 NOTE — Progress Notes (Signed)
[place] OFFICE PROGRESS NOTE  Erin Battles, MD [place] [address]  DIAGNOSIS:  1) Stage IV (T3, N0, M1 a)non-small cell lung cancer, well-differentiated adenocarcinoma presented with multifocal disease involving mainly the right upper lobe as well as the right middle lobe and left lower lobe of the lung diagnosed in May 2019. 2) bilateral small pulmonary emboli diagnosed incidentally on CT scan of the chest on October 16, 2017  Guardant 360 testing was negative for actionable mutations.  PRIOR THERAPY:None  CURRENT THERAPY: 1) Carboplatin for an AUC of 5, Alimta 500 mg/m2 and Keytruda 200 mg IV given every 3 weeks. First dosegiven on 08/18/2017.Beginning with cycle #5, the patient will receive Alimta and Keytruda only.  Status post 4cycles. 2) Xarelto 15 mg p.o. twice daily for the first 3 weeks followed by 20 mg p.o. Daily.  INTERVAL HISTORY: Erin Myers 77 y.o. female returns for routine follow-up visit accompanied by her husband.  The patient is feeling fine today except for ongoing shortness of breath which is unchanged.  The patient denies fevers and chills.  Denies chest pain, hemoptysis.  She continues to wear home oxygen.  She has an ongoing cough.  Denies nausea, vomiting, constipation, diarrhea.  She reports a decreased appetite and has lost a few pounds since her last visit.  The patient is here for evaluation prior to cycle #5 of her treatment.  MEDICAL HISTORY: Past Medical History:  Diagnosis Date  . Anxiety   . Diabetes mellitus without complication ([place])   . Dyslipidemia   . HTN (hypertension)   . Hyperlipemia   . Hypothyroid   . Hypothyroidism   . Lung cancer ([place]) 2000   Carcinoid, s/p RLL lobectomy  . Obesity   . Ovarian cancer ([place])   . Palpitations 2009 and 2006   Negative nuclear stress test   . Sleep apnea    on C-pap    ALLERGIES:  is allergic to adhesive [tape]; codeine; lactose intolerance  (gi); monosodium glutamate; and sulfa antibiotics.  MEDICATIONS:  Current Outpatient Medications  Medication Sig Dispense Refill  . acetaminophen (TYLENOL) 500 MG tablet Take 500 mg by mouth every 6 (six) hours as needed.    . budesonide-formoterol (SYMBICORT) 160-4.5 MCG/ACT inhaler Inhale 2 puffs into the lungs 2 (two) times daily. 1 Inhaler 6  . clorazepate (TRANXENE) 7.5 MG tablet Take 7.5 mg by mouth 2 (two) times daily as needed for anxiety.    . folic acid (FOLVITE) 1 MG tablet TAKE 1 TABLET BY MOUTH DAILY. 30 tablet 2  . guaiFENesin-codeine (CHERATUSSIN AC) 100-10 MG/5ML syrup Take 5 mLs by mouth every 6 (six) hours as needed for cough. 240 mL 0  . ipratropium-albuterol (DUONEB) 0.5-2.5 (3) MG/3ML SOLN Take 3 mLs by nebulization 3 (three) times daily. 360 mL 2  . levothyroxine (SYNTHROID, LEVOTHROID) 112 MCG tablet Take 112 mcg by mouth daily before breakfast.    . lidocaine-prilocaine (EMLA) cream Apply 1 application topically as needed. 30 g 0  . losartan-hydrochlorothiazide (HYZAAR) 50-12.5 MG per tablet Take 1 tablet by mouth every morning.     . metoprolol succinate (TOPROL-XL) 50 MG 24 hr tablet Take 50 mg by mouth 2 (two) times daily.     . rivaroxaban (XARELTO) 20 MG TABS tablet Take 1 tablet (20 mg total) by mouth daily with supper. 30 tablet 1  . rosuvastatin (CRESTOR) 10 MG tablet Take 10 mg by mouth every evening.     . valACYclovir (VALTREX) 1000 MG  tablet Take 500 mg by mouth daily.   0  . benzonatate (TESSALON) 100 MG capsule Take by mouth 3 (three) times daily as needed for cough.    . potassium chloride SA (K-DUR,KLOR-CON) 20 MEQ tablet Take 1 tablet (20 mEq total) by mouth daily. 7 tablet 0  . prochlorperazine (COMPAZINE) 10 MG tablet Take 1 tablet (10 mg total) by mouth every 6 (six) hours as needed for nausea or vomiting. (Patient not taking: Reported on 11/09/2017) 30 tablet 0   Current Facility-Administered Medications  Medication Dose Route Frequency Provider Last  Rate Last Dose  . albuterol (PROVENTIL) (2.5 MG/3ML) 0.083% nebulizer solution 2.5 mg  2.5 mg Nebulization Once Jabron Weese, Roselie Awkward, NP        SURGICAL HISTORY:  Past Surgical History:  Procedure Laterality Date  . ABDOMINAL HYSTERECTOMY  2001  . GRADED EXERCISE TOLERANCE TEST  08/2015    Blood pressure demonstrated a hypertensive response to exercise.  There was less than 0.74mm of horizontal ST segment depression in the inferolateral leads. No diagnostic criterial for ischemia. Moderately impaired exercise tolerance. The patient achieved 5.8 mets. This is a low risk study.  . IR FLUORO GUIDE PORT INSERTION RIGHT  08/17/2017  . IR US GUIDE VASC ACCESS RIGHT  08/17/2017  . LOBECTOMY  2000   RLL  . NM MYOCAR PERF WALL MOTION  08/20/2007   EF 74%  EXERCISE CAPACITY 7 METS  . OOPHORECTOMY  2001  . TONSILLECTOMY    . VIDEO BRONCHOSCOPY Bilateral 07/20/2017   Procedure: VIDEO BRONCHOSCOPY WITH FLUORO;  Surgeon: Collene Gobble, MD;  Location: [facility];  Service: Cardiopulmonary;  Laterality: Bilateral;    REVIEW OF SYSTEMS:   Review of Systems  Constitutional: Negative for chills, fever.  Positive for fatigue.  Positive for decreased appetite and weight loss. HENT:   Negative for mouth sores, nosebleeds, sore throat and trouble swallowing.   Eyes: Negative for eye problems and icterus.  Respiratory: Negative for hemoptysis and wheezing.  Positive for cough and shortness of breath. Cardiovascular: Negative for chest pain and leg swelling.  Gastrointestinal: Negative for abdominal pain, constipation, diarrhea, nausea and vomiting.  Genitourinary: Negative for bladder incontinence, difficulty urinating, dysuria, frequency and hematuria.   Musculoskeletal: Negative for back pain, gait problem, neck pain and neck stiffness.  Skin: Negative for itching and rash.  Neurological: Negative for dizziness, extremity weakness, gait problem, headaches, light-headedness and seizures.  Hematological:  Negative for adenopathy. Does not bruise/bleed easily.  Psychiatric/Behavioral: Negative for confusion, depression and sleep disturbance. The patient is not nervous/anxious.     PHYSICAL EXAMINATION:  Blood pressure (!) 127/52, pulse 89, temperature 98.4 F (36.9 C), temperature source Oral, resp. rate 18, height 5' 4.75" (1.645 m), weight 195 lb (88.5 kg), SpO2 94 %.  ECOG PERFORMANCE STATUS: 1 - Symptomatic but completely ambulatory  Physical Exam  Constitutional: Oriented to person, place, and time and well-developed, well-nourished, and in no distress. No distress.  HENT:  Head: Normocephalic and atraumatic.  Mouth/Throat: Oropharynx is clear and moist. No oropharyngeal exudate.  Eyes: Conjunctivae are normal. Right eye exhibits no discharge. Left eye exhibits no discharge. No scleral icterus.  Neck: Normal range of motion. Neck supple.  Cardiovascular: Normal rate, regular rhythm, normal heart sounds and intact distal pulses.   Pulmonary/Chest: Rales and wheezes bilaterally.  Abdominal: Soft. Bowel sounds are normal. Exhibits no distension and no mass. There is no tenderness.  Musculoskeletal: Normal range of motion. Exhibits no edema.  Lymphadenopathy:    No  cervical adenopathy.  Neurological: Alert and oriented to person, place, and time. Exhibits normal muscle tone. Gait normal. Coordination normal.  Skin: Skin is warm and dry. No rash noted. Not diaphoretic. No erythema. No pallor.  Psychiatric: Mood, memory and judgment normal.  Vitals reviewed.  LABORATORY DATA: Lab Results  Component Value Date   WBC 4.7 11/09/2017   HGB 11.9 11/09/2017   HCT 35.8 11/09/2017   MCV 97.0 11/09/2017   PLT 230 11/09/2017      Chemistry      Component Value Date/Time   NA 143 11/09/2017 0900   K 3.1 (L) 11/09/2017 0900   CL 101 11/09/2017 0900   CO2 31 11/09/2017 0900   BUN 10 11/09/2017 0900   CREATININE 0.83 11/09/2017 0900      Component Value Date/Time   CALCIUM 9.6  11/09/2017 0900   ALKPHOS 55 11/09/2017 0900   AST 17 11/09/2017 0900   ALT 8 11/09/2017 0900   BILITOT 0.4 11/09/2017 0900       RADIOGRAPHIC STUDIES:  Ct Chest W Contrast  Result Date: 10/16/2017 CLINICAL DATA:  Restaging non-small cell lung cancer. EXAM: CT CHEST, ABDOMEN, AND PELVIS WITH CONTRAST TECHNIQUE: Multidetector CT imaging of the chest, abdomen and pelvis was performed following the standard protocol during bolus administration of intravenous contrast. CONTRAST:  148mL ISOVUE-300 IOPAMIDOL (ISOVUE-300) INJECTION 61% COMPARISON:  PET-CT 08/09/2017 FINDINGS: CT CHEST FINDINGS Cardiovascular: The heart is normal in size. No pericardial effusion. The aorta is normal in caliber. Stable atherosclerotic calcifications but no dissection and. The major branch vessels are patent. Scattered coronary artery calcifications. Small bilateral pulmonary emboli are noted. Mediastinum/Nodes: Enlarging mediastinal lymph nodes. 9.5 mm right paratracheal node on image number 18 previously measured 6.5 mm. 7 mm precarinal node on image number 21 previously measured 4.5 mm. 13.5 mm subcarinal lymph node on image number 28 previously measured 8.5 mm. Lungs/Pleura: Persistent dense ground-glass opacity and interstitial thickening in the right upper lobe with more overt airspace consolidation in the posterior aspect. The actual area of consolidated tumor appears larger measuring approximately 4.9 x 4.3 cm. On the prior chest CT this measured approximately 3.6 x 2.8 cm. Probable surrounding interstitial spread of tumor appears relatively stable. There is also persistent bilateral lower lobe peribronchovascular nodularity, bronchial wall thickening and interstitial thickening all highly suspicious for transbronchial and interstitial spread of tumor. This appears slightly progressive. Musculoskeletal: No chest wall masses or breast masses. No supraclavicular or axillary adenopathy. No significant bony findings. CT  ABDOMEN PELVIS FINDINGS Hepatobiliary: No focal hepatic lesions or intrahepatic biliary dilatation. The gallbladder is normal. No common bile duct dilatation. Pancreas: No mass, inflammation or ductal dilatation. Spleen: Normal size.  No focal lesions. Adrenals/Urinary Tract: The adrenal glands and kidneys are unremarkable and stable. The bladder appears normal. Stomach/Bowel: The stomach, duodenum, small bowel and colon are unremarkable. No acute inflammatory changes, mass lesions or obstructive findings. Vascular/Lymphatic: Stable atherosclerotic calcifications involving the aorta and iliac arteries. No aneurysm or dissection. The branch vessels are patent. The major venous structures are patent. Small scattered mesenteric and retroperitoneal lymph nodes appears stable. No new or progressive findings are demonstrated. No pelvic adenopathy. Reproductive: Surgically absent. Other: No pelvic mass or adenopathy. No free pelvic fluid collections. No inguinal mass or adenopathy. No abdominal wall hernia or subcutaneous lesions. Musculoskeletal: No significant bony findings. IMPRESSION: 1. Progressive solid consolidative part of the right upper lobe process suspicious for progressive solid tumor component. 2. Progressive appearing bilateral lower lobe process, likely interstitial and  transbronchial spread of tumor. 3. Slight progression of mediastinal lymph nodes. 4. Small bilateral pulmonary emboli noted. 5. No findings for metastatic disease involving the abdomen/pelvis or bony structures. These results were called by telephone at the time of interpretation on 10/16/2017 at 1:56 pm to Dr. Earlie Server office. Electronically Signed   By: Marijo Sanes M.D.   On: 10/16/2017 13:56   Ct Abdomen Pelvis W Contrast  Result Date: 10/16/2017 CLINICAL DATA:  Restaging non-small cell lung cancer. EXAM: CT CHEST, ABDOMEN, AND PELVIS WITH CONTRAST TECHNIQUE: Multidetector CT imaging of the chest, abdomen and pelvis was performed  following the standard protocol during bolus administration of intravenous contrast. CONTRAST:  135mL ISOVUE-300 IOPAMIDOL (ISOVUE-300) INJECTION 61% COMPARISON:  PET-CT 08/09/2017 FINDINGS: CT CHEST FINDINGS Cardiovascular: The heart is normal in size. No pericardial effusion. The aorta is normal in caliber. Stable atherosclerotic calcifications but no dissection and. The major branch vessels are patent. Scattered coronary artery calcifications. Small bilateral pulmonary emboli are noted. Mediastinum/Nodes: Enlarging mediastinal lymph nodes. 9.5 mm right paratracheal node on image number 18 previously measured 6.5 mm. 7 mm precarinal node on image number 21 previously measured 4.5 mm. 13.5 mm subcarinal lymph node on image number 28 previously measured 8.5 mm. Lungs/Pleura: Persistent dense ground-glass opacity and interstitial thickening in the right upper lobe with more overt airspace consolidation in the posterior aspect. The actual area of consolidated tumor appears larger measuring approximately 4.9 x 4.3 cm. On the prior chest CT this measured approximately 3.6 x 2.8 cm. Probable surrounding interstitial spread of tumor appears relatively stable. There is also persistent bilateral lower lobe peribronchovascular nodularity, bronchial wall thickening and interstitial thickening all highly suspicious for transbronchial and interstitial spread of tumor. This appears slightly progressive. Musculoskeletal: No chest wall masses or breast masses. No supraclavicular or axillary adenopathy. No significant bony findings. CT ABDOMEN PELVIS FINDINGS Hepatobiliary: No focal hepatic lesions or intrahepatic biliary dilatation. The gallbladder is normal. No common bile duct dilatation. Pancreas: No mass, inflammation or ductal dilatation. Spleen: Normal size.  No focal lesions. Adrenals/Urinary Tract: The adrenal glands and kidneys are unremarkable and stable. The bladder appears normal. Stomach/Bowel: The stomach, duodenum,  small bowel and colon are unremarkable. No acute inflammatory changes, mass lesions or obstructive findings. Vascular/Lymphatic: Stable atherosclerotic calcifications involving the aorta and iliac arteries. No aneurysm or dissection. The branch vessels are patent. The major venous structures are patent. Small scattered mesenteric and retroperitoneal lymph nodes appears stable. No new or progressive findings are demonstrated. No pelvic adenopathy. Reproductive: Surgically absent. Other: No pelvic mass or adenopathy. No free pelvic fluid collections. No inguinal mass or adenopathy. No abdominal wall hernia or subcutaneous lesions. Musculoskeletal: No significant bony findings. IMPRESSION: 1. Progressive solid consolidative part of the right upper lobe process suspicious for progressive solid tumor component. 2. Progressive appearing bilateral lower lobe process, likely interstitial and transbronchial spread of tumor. 3. Slight progression of mediastinal lymph nodes. 4. Small bilateral pulmonary emboli noted. 5. No findings for metastatic disease involving the abdomen/pelvis or bony structures. These results were called by telephone at the time of interpretation on 10/16/2017 at 1:56 pm to Dr. Earlie Server office. Electronically Signed   By: Marijo Sanes M.D.   On: 10/16/2017 13:56     ASSESSMENT/PLAN:  Adenocarcinoma of right lung, stage 4 (HCC) This is a very pleasant 77 year old white female with stage IV non-small cell lung cancer, adenocarcinoma with no actionable mutations. She is currently undergoing systemic chemotherapy with carboplatin, Alimta and Ketruda (pembrolizumab) status post  4 cycles.  Beginning with cycle #5, the patient will receive Alimta and Keytruda only. She has been tolerating this treatment fairly well. Recommend for the patient to proceed with cycle 5 of her treatment today as scheduled.  She will know no longer need weekly labs and I have asked scheduling to cancel these.  The patient  will follow-up in 3 weeks for evaluation prior to cycle #6 of her treatment.  For her pulmonary emboli, she will continue on Xarelto 20 milligrams daily.  For her decreased appetite and weight loss, I have referred her to the dietitian.  The patient was advised to call immediately if she has any concerning symptoms in the interval. The patient voices understanding of current disease status and treatment options and is in agreement with the current care plan.  All questions were answered. The patient knows to call the clinic with any problems, questions or concerns. We can certainly see the patient much sooner if necessary.    Orders Placed This Encounter  Procedures  . Amb Referral to Nutrition and Diabetic E    Referral Priority:   Routine    Referral Type:   Consultation    Referral Reason:   Specialty Services Required    Number of Visits Requested:   [place], DNP, AGPCNP-BC, AOCNP 11/09/17

## 2017-11-10 ENCOUNTER — Telehealth: Payer: Self-pay | Admitting: Pulmonary Disease

## 2017-11-10 ENCOUNTER — Encounter: Payer: Self-pay | Admitting: Pulmonary Disease

## 2017-11-10 ENCOUNTER — Ambulatory Visit: Payer: PPO | Admitting: Pulmonary Disease

## 2017-11-10 VITALS — BP 106/64 | HR 78 | Ht 64.25 in | Wt 198.0 lb

## 2017-11-10 DIAGNOSIS — C3491 Malignant neoplasm of unspecified part of right bronchus or lung: Secondary | ICD-10-CM | POA: Diagnosis not present

## 2017-11-10 DIAGNOSIS — R0600 Dyspnea, unspecified: Secondary | ICD-10-CM

## 2017-11-10 DIAGNOSIS — R0902 Hypoxemia: Secondary | ICD-10-CM

## 2017-11-10 DIAGNOSIS — J9621 Acute and chronic respiratory failure with hypoxia: Secondary | ICD-10-CM

## 2017-11-10 DIAGNOSIS — R05 Cough: Secondary | ICD-10-CM | POA: Diagnosis not present

## 2017-11-10 DIAGNOSIS — R059 Cough, unspecified: Secondary | ICD-10-CM

## 2017-11-10 MED ORDER — BENZONATATE 200 MG PO CAPS: 200.0000 mg | ORAL_CAPSULE | Freq: Three times a day (TID) | ORAL | 1 refills | Status: DC | PRN

## 2017-11-10 MED ORDER — GUAIFENESIN-CODEINE 100-10 MG/5ML PO SYRP: 5.0000 mL | ORAL_SOLUTION | Freq: Four times a day (QID) | ORAL | 0 refills | Status: DC | PRN

## 2017-11-10 NOTE — Assessment & Plan Note (Signed)
Refilled cough medicine  Tessalon Perles 200 mg prescription >>>Can use every 8 hours as needed for cough  Follow-up with our office in 3 to 4 months  Consider echocardiogram and pulmonary function testing start of next year  Continue oxygen therapy as prescribed  Keep follow-up with oncology

## 2017-11-10 NOTE — Progress Notes (Signed)
@Patient  ID: Erin Myers, female    DOB: [birth year], 77 y.o.   MRN: [id number]  Chief Complaint  Patient presents with  . Acute Visit    Reports increased coughing, DOE. Cough is producing clear mucus.     Referring provider: [name], MD  HPI:  77 year old female patient seen in our office by Dr. Lenna Gilford.  Patient is also currently being seen by Dr. Earlie Server with oncology.  Patient is currently receiving chemo.  11/10/2017  - Visit   77 year old patient presenting today for worsening cough.  Patient reports that she has been adherent to her treatment plan.  Patient reports that she discussed her symptoms with oncology and they referred patient to follow-up with our office.  Patient reports they have been using over-the-counter measures and Tessalon Perles to help manage cough and are currently out of there when emphysema with codeine cough medicine.  Patient reports the cough is all the time but has gotten better over the past few days.  Patient is present here with her husband.  Patient has been also report that they need assistance with oxygen supplies at home.  Husband and patient are discussing that they would like to have the ability to refill tanks at home.  Patient reports that POC at 5 L is does not maintaining oxygen levels as they need.   Tests:   10/16/2017-CT chest chest-progressive solid consolidation part of the right upper lobe aggressive appearing bilateral lower lobe process likely interstitial small bilateral pulmonary emboli noted  08/09/2017-PET scan- borderline hypermetabolic right upper lobe consolidation and groundglass and hypometabolic patchy groundglass in both lower lobes  05/24/2017-echocardiogram LV ejection fraction 65 to 70%   05/02/2017-spirometry -normal spirometry   Chart Review:     Specialty Problems      Pulmonary Problems   OSA on CPAP    on C-pap      Dyspnea   Community acquired pneumonia of right upper lobe of lung  ([place])   Acute on chronic respiratory failure with hypoxia (HCC)   Sinusitis   Adenocarcinoma of right lung, stage 4 ([place])    10/16/2017-CT chest chest-progressive solid consolidation part of the right upper lobe aggressive appearing bilateral lower lobe process likely interstitial small bilateral pulmonary emboli noted  08/09/2017-PET scan- borderline hypermetabolic right upper lobe consolidation and groundglass and hypometabolic patchy groundglass in both lower lobes       Bilateral pulmonary embolism (HCC)   Cough      Allergies  Allergen Reactions  . Adhesive [Tape] Other (See Comments)    Irritates skin   . Codeine Other (See Comments)    Skin crawlinf feeling  . Lactose Intolerance (Gi) Diarrhea  . Monosodium Glutamate Diarrhea  . Sulfa Antibiotics Nausea And Vomiting    Immunization History  Administered Date(s) Administered  . Influenza Split 12/14/2016  . Influenza, High Dose Seasonal PF 02/20/2014  . Influenza,inj,Quad PF,6+ Mos 12/13/2014    Past Medical History:  Diagnosis Date  . Anxiety   . Diabetes mellitus without complication ([place])   . Dyslipidemia   . HTN (hypertension)   . Hyperlipemia   . Hypothyroid   . Hypothyroidism   . Lung cancer ([place]) 2000   Carcinoid, s/p RLL lobectomy  . Obesity   . Ovarian cancer ([place])   . Palpitations 2009 and 2006   Negative nuclear stress test   . Sleep apnea    on C-pap    Tobacco History: Social History   Tobacco Use  Smoking Status Former  Smoker  . Packs/day: 0.75  . Years: 30.00  . Pack years: 22.50  . Types: Cigarettes  . Last attempt to quit: 08/18/1991  . Years since quitting: 26.2  Smokeless Tobacco Never Used   Counseling given: Not Answered  Continue not smoking  Outpatient Encounter Medications as of 11/10/2017  Medication Sig  . acetaminophen (TYLENOL) 500 MG tablet Take 500 mg by mouth every 6 (six) hours as needed.  . budesonide-formoterol (SYMBICORT) 160-4.5 MCG/ACT inhaler Inhale 2 puffs  into the lungs 2 (two) times daily.  . clorazepate (TRANXENE) 7.5 MG tablet Take 7.5 mg by mouth 2 (two) times daily as needed for anxiety.  . folic acid (FOLVITE) 1 MG tablet TAKE 1 TABLET BY MOUTH DAILY.  [name] guaiFENesin-codeine (CHERATUSSIN AC) 100-10 MG/5ML syrup Take 5 mLs by mouth every 6 (six) hours as needed for cough.  [name] ipratropium-albuterol (DUONEB) 0.5-2.5 (3) MG/3ML SOLN Take 3 mLs by nebulization 3 (three) times daily.  [name] levothyroxine (SYNTHROID, LEVOTHROID) 112 MCG tablet Take 112 mcg by mouth daily before breakfast.  . lidocaine-prilocaine (EMLA) cream Apply 1 application topically as needed.  [name] losartan-hydrochlorothiazide (HYZAAR) 50-12.5 MG per tablet Take 1 tablet by mouth every morning.   . metoprolol succinate (TOPROL-XL) 50 MG 24 hr tablet Take 50 mg by mouth 2 (two) times daily.   . potassium chloride SA (K-DUR,KLOR-CON) 20 MEQ tablet Take 1 tablet (20 mEq total) by mouth daily.  . prochlorperazine (COMPAZINE) 10 MG tablet Take 1 tablet (10 mg total) by mouth every 6 (six) hours as needed for nausea or vomiting.  . rivaroxaban (XARELTO) 20 MG TABS tablet Take 1 tablet (20 mg total) by mouth daily with supper.  . rosuvastatin (CRESTOR) 10 MG tablet Take 10 mg by mouth every evening.   . valACYclovir (VALTREX) 1000 MG tablet Take 500 mg by mouth daily.   . [DISCONTINUED] benzonatate (TESSALON) 100 MG capsule Take by mouth 3 (three) times daily as needed for cough.  . [DISCONTINUED] guaiFENesin-codeine (CHERATUSSIN AC) 100-10 MG/5ML syrup Take 5 mLs by mouth every 6 (six) hours as needed for cough.  . benzonatate (TESSALON) 200 MG capsule Take 1 capsule (200 mg total) by mouth 3 (three) times daily as needed for cough.  . [DISCONTINUED] benzonatate (TESSALON) 200 MG capsule Take 1 capsule (200 mg total) by mouth 3 (three) times daily as needed for cough.   No facility-administered encounter medications on file as of 11/10/2017.      Review of Systems  Review of Systems    Constitutional: Positive for fatigue. Negative for chills, fever and unexpected weight change.  HENT: Negative for congestion, postnasal drip, sinus pressure and sinus pain.   Respiratory: Positive for cough. Negative for chest tightness, shortness of breath and wheezing.   Cardiovascular: Negative for chest pain and palpitations.  Gastrointestinal: Negative for blood in stool, diarrhea, nausea and vomiting.  Genitourinary: Negative for dysuria and frequency.  Musculoskeletal: Negative for arthralgias.  Skin: Negative for color change.  Allergic/Immunologic: Negative for environmental allergies and food allergies.  Neurological: Negative for dizziness, light-headedness and headaches.  Psychiatric/Behavioral: Negative for dysphoric mood. The patient is not nervous/anxious.   All other systems reviewed and are negative.    Physical Exam  BP 106/64 (BP Location: Left Arm, Cuff Size: Normal)   Pulse 78   Ht 5' 4.25" (1.632 m)   Wt 198 lb (89.8 kg)   SpO2 96%   BMI 33.72 kg/m   Wt Readings from Last 5 Encounters:  11/10/17 198 lb (  89.8 kg)  11/09/17 195 lb (88.5 kg)  10/19/17 198 lb 12.8 oz (90.2 kg)  09/28/17 201 lb 9.6 oz (91.4 kg)  09/07/17 204 lb 6.4 oz (92.7 kg)    Physical Exam  Constitutional: She is oriented to person, place, and time and well-developed, well-nourished, and in no distress. No distress.  HENT:  Head: Normocephalic and atraumatic.  Right Ear: Hearing, tympanic membrane, external ear and ear canal normal.  Left Ear: Hearing, tympanic membrane, external ear and ear canal normal.  Nose: Nose normal. Right sinus exhibits no maxillary sinus tenderness and no frontal sinus tenderness. Left sinus exhibits no maxillary sinus tenderness and no frontal sinus tenderness.  Mouth/Throat: Uvula is midline and oropharynx is clear and moist. No oropharyngeal exudate.  Eyes: Pupils are equal, round, and reactive to light.  Neck: Normal range of motion. Neck supple. No JVD  present.  Cardiovascular: Normal rate, regular rhythm and normal heart sounds.  Pulmonary/Chest: Effort normal. No accessory muscle usage. No respiratory distress. She has no decreased breath sounds. She has no wheezes. She has rhonchi.  Abdominal: Soft. Bowel sounds are normal. There is no tenderness.  Musculoskeletal: Normal range of motion. She exhibits no edema.  Lymphadenopathy:    She has no cervical adenopathy.  Neurological: She is alert and oriented to person, place, and time. Gait normal.  Skin: Skin is warm and dry. She is not diaphoretic. No erythema.  Psychiatric: Mood, memory, affect and judgment normal.  Nursing note and vitals reviewed.    Lab Results:  CBC    Component Value Date/Time   WBC 4.7 11/09/2017 0900   WBC 15.2 (H) 08/17/2017 1155   RBC 3.69 (L) 11/09/2017 0900   HGB 11.9 11/09/2017 0900   HCT 35.8 11/09/2017 0900   PLT 230 11/09/2017 0900   MCV 97.0 11/09/2017 0900   MCH 32.2 11/09/2017 0900   MCHC 33.2 11/09/2017 0900   RDW 20.7 (H) 11/09/2017 0900   LYMPHSABS 1.1 11/09/2017 0900   MONOABS 0.7 11/09/2017 0900   EOSABS 0.1 11/09/2017 0900   BASOSABS 0.0 11/09/2017 0900    BMET    Component Value Date/Time   NA 143 11/09/2017 0900   K 3.1 (L) 11/09/2017 0900   CL 101 11/09/2017 0900   CO2 31 11/09/2017 0900   GLUCOSE 106 (H) 11/09/2017 0900   BUN 10 11/09/2017 0900   CREATININE 0.83 11/09/2017 0900   CALCIUM 9.6 11/09/2017 0900   GFRNONAA >60 11/09/2017 0900   GFRAA >60 11/09/2017 0900    BNP No results found for: BNP  ProBNP No results found for: PROBNP  Imaging: Ct Chest W Contrast  Result Date: 10/16/2017 CLINICAL DATA:  Restaging non-small cell lung cancer. EXAM: CT CHEST, ABDOMEN, AND PELVIS WITH CONTRAST TECHNIQUE: Multidetector CT imaging of the chest, abdomen and pelvis was performed following the standard protocol during bolus administration of intravenous contrast. CONTRAST:  128mL ISOVUE-300 IOPAMIDOL (ISOVUE-300)  INJECTION 61% COMPARISON:  PET-CT 08/09/2017 FINDINGS: CT CHEST FINDINGS Cardiovascular: The heart is normal in size. No pericardial effusion. The aorta is normal in caliber. Stable atherosclerotic calcifications but no dissection and. The major branch vessels are patent. Scattered coronary artery calcifications. Small bilateral pulmonary emboli are noted. Mediastinum/Nodes: Enlarging mediastinal lymph nodes. 9.5 mm right paratracheal node on image number 18 previously measured 6.5 mm. 7 mm precarinal node on image number 21 previously measured 4.5 mm. 13.5 mm subcarinal lymph node on image number 28 previously measured 8.5 mm. Lungs/Pleura: Persistent dense ground-glass opacity and interstitial thickening  in the right upper lobe with more overt airspace consolidation in the posterior aspect. The actual area of consolidated tumor appears larger measuring approximately 4.9 x 4.3 cm. On the prior chest CT this measured approximately 3.6 x 2.8 cm. Probable surrounding interstitial spread of tumor appears relatively stable. There is also persistent bilateral lower lobe peribronchovascular nodularity, bronchial wall thickening and interstitial thickening all highly suspicious for transbronchial and interstitial spread of tumor. This appears slightly progressive. Musculoskeletal: No chest wall masses or breast masses. No supraclavicular or axillary adenopathy. No significant bony findings. CT ABDOMEN PELVIS FINDINGS Hepatobiliary: No focal hepatic lesions or intrahepatic biliary dilatation. The gallbladder is normal. No common bile duct dilatation. Pancreas: No mass, inflammation or ductal dilatation. Spleen: Normal size.  No focal lesions. Adrenals/Urinary Tract: The adrenal glands and kidneys are unremarkable and stable. The bladder appears normal. Stomach/Bowel: The stomach, duodenum, small bowel and colon are unremarkable. No acute inflammatory changes, mass lesions or obstructive findings. Vascular/Lymphatic: Stable  atherosclerotic calcifications involving the aorta and iliac arteries. No aneurysm or dissection. The branch vessels are patent. The major venous structures are patent. Small scattered mesenteric and retroperitoneal lymph nodes appears stable. No new or progressive findings are demonstrated. No pelvic adenopathy. Reproductive: Surgically absent. Other: No pelvic mass or adenopathy. No free pelvic fluid collections. No inguinal mass or adenopathy. No abdominal wall hernia or subcutaneous lesions. Musculoskeletal: No significant bony findings. IMPRESSION: 1. Progressive solid consolidative part of the right upper lobe process suspicious for progressive solid tumor component. 2. Progressive appearing bilateral lower lobe process, likely interstitial and transbronchial spread of tumor. 3. Slight progression of mediastinal lymph nodes. 4. Small bilateral pulmonary emboli noted. 5. No findings for metastatic disease involving the abdomen/pelvis or bony structures. These results were called by telephone at the time of interpretation on 10/16/2017 at 1:56 pm to Dr. Earlie Server office. Electronically Signed   By: Marijo Sanes M.D.   On: 10/16/2017 13:56   Ct Abdomen Pelvis W Contrast  Result Date: 10/16/2017 CLINICAL DATA:  Restaging non-small cell lung cancer. EXAM: CT CHEST, ABDOMEN, AND PELVIS WITH CONTRAST TECHNIQUE: Multidetector CT imaging of the chest, abdomen and pelvis was performed following the standard protocol during bolus administration of intravenous contrast. CONTRAST:  149mL ISOVUE-300 IOPAMIDOL (ISOVUE-300) INJECTION 61% COMPARISON:  PET-CT 08/09/2017 FINDINGS: CT CHEST FINDINGS Cardiovascular: The heart is normal in size. No pericardial effusion. The aorta is normal in caliber. Stable atherosclerotic calcifications but no dissection and. The major branch vessels are patent. Scattered coronary artery calcifications. Small bilateral pulmonary emboli are noted. Mediastinum/Nodes: Enlarging mediastinal lymph  nodes. 9.5 mm right paratracheal node on image number 18 previously measured 6.5 mm. 7 mm precarinal node on image number 21 previously measured 4.5 mm. 13.5 mm subcarinal lymph node on image number 28 previously measured 8.5 mm. Lungs/Pleura: Persistent dense ground-glass opacity and interstitial thickening in the right upper lobe with more overt airspace consolidation in the posterior aspect. The actual area of consolidated tumor appears larger measuring approximately 4.9 x 4.3 cm. On the prior chest CT this measured approximately 3.6 x 2.8 cm. Probable surrounding interstitial spread of tumor appears relatively stable. There is also persistent bilateral lower lobe peribronchovascular nodularity, bronchial wall thickening and interstitial thickening all highly suspicious for transbronchial and interstitial spread of tumor. This appears slightly progressive. Musculoskeletal: No chest wall masses or breast masses. No supraclavicular or axillary adenopathy. No significant bony findings. CT ABDOMEN PELVIS FINDINGS Hepatobiliary: No focal hepatic lesions or intrahepatic biliary dilatation. The gallbladder is normal.  No common bile duct dilatation. Pancreas: No mass, inflammation or ductal dilatation. Spleen: Normal size.  No focal lesions. Adrenals/Urinary Tract: The adrenal glands and kidneys are unremarkable and stable. The bladder appears normal. Stomach/Bowel: The stomach, duodenum, small bowel and colon are unremarkable. No acute inflammatory changes, mass lesions or obstructive findings. Vascular/Lymphatic: Stable atherosclerotic calcifications involving the aorta and iliac arteries. No aneurysm or dissection. The branch vessels are patent. The major venous structures are patent. Small scattered mesenteric and retroperitoneal lymph nodes appears stable. No new or progressive findings are demonstrated. No pelvic adenopathy. Reproductive: Surgically absent. Other: No pelvic mass or adenopathy. No free pelvic fluid  collections. No inguinal mass or adenopathy. No abdominal wall hernia or subcutaneous lesions. Musculoskeletal: No significant bony findings. IMPRESSION: 1. Progressive solid consolidative part of the right upper lobe process suspicious for progressive solid tumor component. 2. Progressive appearing bilateral lower lobe process, likely interstitial and transbronchial spread of tumor. 3. Slight progression of mediastinal lymph nodes. 4. Small bilateral pulmonary emboli noted. 5. No findings for metastatic disease involving the abdomen/pelvis or bony structures. These results were called by telephone at the time of interpretation on 10/16/2017 at 1:56 pm to Dr. Earlie Server office. Electronically Signed   By: Marijo Sanes M.D.   On: 10/16/2017 13:56      Assessment & Plan:   Pleasant 77 year old patient seen today in office visit.  We will treat chronic cough.  Will increase Tessalon Perles to 200 mg can use every 8 hours as needed for cough.  We will also refill codeine cough syrup.  Patient office if symptoms are worsening.  We will hold off on chest x-ray at this time.  Consider echocardiogram and pulmonary function testing in January/2020.  Cough Refilled cough medicine  Tessalon Perles 200 mg prescription >>>Can use every 8 hours as needed for cough  Follow-up with our office in 3 to 4 months  Consider echocardiogram and pulmonary function testing start of next year  Continue oxygen therapy as prescribed  Keep follow-up with oncology   Acute on chronic respiratory failure with hypoxia ([place]) Will place order to DME company for oxygen needs We will try to place order for try to re-able to refill oxygen tanks at home  Adenocarcinoma of right lung, stage 4 [facility]) Keep follow-up with oncology     Lauraine Rinne, NP 11/10/2017

## 2017-11-10 NOTE — Assessment & Plan Note (Signed)
Keep follow-up with oncology

## 2017-11-10 NOTE — Telephone Encounter (Signed)
Called and spoke with Patient.  Patient was requesting new prescription for Cheratussin.  Informed her that Dr Lenna Gilford was out of the office this morning, but should be in this afternoon.  Patient stated that she had a 2pm appt with Wyn Quaker, NP.  She stated that she would check with Aaron Edelman at appointment for prescription or see if there is one signed by Dr. Lenna Gilford, at that time.  Last refill was 08/29/17 for 245ml, take 5 ml every 6 hours as needed

## 2017-11-10 NOTE — Patient Instructions (Addendum)
Saline gel samples provided today  Refilled cough medicine  Tessalon Perles 200 mg prescription >>>Can use every 8 hours as needed for cough  Follow-up with our office in 3 to 4 months  Consider echocardiogram and pulmonary function testing start of next year  Continue oxygen therapy as prescribed  Keep follow-up with oncology      It is flu season:   >>>Remember to be washing your hands regularly, using hand sanitizer, be careful to use around herself with has contact with people who are sick will increase her chances of getting sick yourself. >>> Best ways to protect herself from the flu: Receive the yearly flu vaccine, practice good hand hygiene washing with soap and also using hand sanitizer when available, eat a nutritious meals, get adequate rest, hydrate appropriately   Please contact the office if your symptoms worsen or you have concerns that you are not improving.   Thank you for choosing [facility] for your healthcare, and for allowing [place] to partner with you on your healthcare journey. I am thankful to be able to provide care to you today.   Wyn Quaker FNP-C

## 2017-11-10 NOTE — Assessment & Plan Note (Signed)
Will place order to DME company for oxygen needs We will try to place order for try to re-able to refill oxygen tanks at home

## 2017-11-14 ENCOUNTER — Telehealth: Payer: Self-pay | Admitting: Internal Medicine

## 2017-11-14 NOTE — Telephone Encounter (Signed)
Per pt's chart, it appears that Cheratussin was refilled by Wyn Quaker during 11/10/17 OV. Nothing further is needed.

## 2017-11-14 NOTE — Telephone Encounter (Signed)
Appts scheduled per 8/29 los

## 2017-11-16 ENCOUNTER — Other Ambulatory Visit: Payer: PPO

## 2017-11-23 ENCOUNTER — Other Ambulatory Visit: Payer: PPO

## 2017-11-30 ENCOUNTER — Encounter: Payer: Self-pay | Admitting: Internal Medicine

## 2017-11-30 ENCOUNTER — Telehealth: Payer: Self-pay | Admitting: Internal Medicine

## 2017-11-30 ENCOUNTER — Inpatient Hospital Stay: Payer: PPO | Attending: Internal Medicine

## 2017-11-30 ENCOUNTER — Inpatient Hospital Stay: Payer: PPO

## 2017-11-30 ENCOUNTER — Inpatient Hospital Stay (HOSPITAL_BASED_OUTPATIENT_CLINIC_OR_DEPARTMENT_OTHER): Payer: PPO | Admitting: Internal Medicine

## 2017-11-30 VITALS — BP 121/53 | HR 85 | Temp 98.0°F | Resp 19 | Ht 64.25 in | Wt 192.4 lb

## 2017-11-30 DIAGNOSIS — I10 Essential (primary) hypertension: Secondary | ICD-10-CM | POA: Diagnosis not present

## 2017-11-30 DIAGNOSIS — F419 Anxiety disorder, unspecified: Secondary | ICD-10-CM

## 2017-11-30 DIAGNOSIS — Z79899 Other long term (current) drug therapy: Secondary | ICD-10-CM | POA: Insufficient documentation

## 2017-11-30 DIAGNOSIS — E119 Type 2 diabetes mellitus without complications: Secondary | ICD-10-CM | POA: Insufficient documentation

## 2017-11-30 DIAGNOSIS — C7802 Secondary malignant neoplasm of left lung: Secondary | ICD-10-CM | POA: Insufficient documentation

## 2017-11-30 DIAGNOSIS — Z5112 Encounter for antineoplastic immunotherapy: Secondary | ICD-10-CM

## 2017-11-30 DIAGNOSIS — I2699 Other pulmonary embolism without acute cor pulmonale: Secondary | ICD-10-CM | POA: Diagnosis not present

## 2017-11-30 DIAGNOSIS — E669 Obesity, unspecified: Secondary | ICD-10-CM

## 2017-11-30 DIAGNOSIS — Z5111 Encounter for antineoplastic chemotherapy: Secondary | ICD-10-CM | POA: Diagnosis not present

## 2017-11-30 DIAGNOSIS — Z7901 Long term (current) use of anticoagulants: Secondary | ICD-10-CM

## 2017-11-30 DIAGNOSIS — E785 Hyperlipidemia, unspecified: Secondary | ICD-10-CM | POA: Diagnosis not present

## 2017-11-30 DIAGNOSIS — C3411 Malignant neoplasm of upper lobe, right bronchus or lung: Secondary | ICD-10-CM | POA: Insufficient documentation

## 2017-11-30 DIAGNOSIS — C3491 Malignant neoplasm of unspecified part of right bronchus or lung: Secondary | ICD-10-CM

## 2017-11-30 DIAGNOSIS — G4730 Sleep apnea, unspecified: Secondary | ICD-10-CM | POA: Diagnosis not present

## 2017-11-30 DIAGNOSIS — C3490 Malignant neoplasm of unspecified part of unspecified bronchus or lung: Secondary | ICD-10-CM

## 2017-11-30 DIAGNOSIS — E039 Hypothyroidism, unspecified: Secondary | ICD-10-CM | POA: Insufficient documentation

## 2017-11-30 DIAGNOSIS — Z95828 Presence of other vascular implants and grafts: Secondary | ICD-10-CM

## 2017-11-30 LAB — CMP (CANCER CENTER ONLY)
ALT: 14 U/L (ref 0–44)
AST: 18 U/L (ref 15–41)
Albumin: 3.3 g/dL — ABNORMAL LOW (ref 3.5–5.0)
Alkaline Phosphatase: 53 U/L (ref 38–126)
Anion gap: 9 (ref 5–15)
BUN: 8 mg/dL (ref 8–23)
CHLORIDE: 104 mmol/L (ref 98–111)
CO2: 32 mmol/L (ref 22–32)
CREATININE: 0.82 mg/dL (ref 0.44–1.00)
Calcium: 9.4 mg/dL (ref 8.9–10.3)
Glucose, Bld: 100 mg/dL — ABNORMAL HIGH (ref 70–99)
POTASSIUM: 3.5 mmol/L (ref 3.5–5.1)
SODIUM: 145 mmol/L (ref 135–145)
Total Bilirubin: 0.4 mg/dL (ref 0.3–1.2)
Total Protein: 7 g/dL (ref 6.5–8.1)

## 2017-11-30 LAB — CBC WITH DIFFERENTIAL (CANCER CENTER ONLY)
BASOS ABS: 0 10*3/uL (ref 0.0–0.1)
Basophils Relative: 1 %
EOS ABS: 0.2 10*3/uL (ref 0.0–0.5)
Eosinophils Relative: 4 %
HCT: 33.8 % — ABNORMAL LOW (ref 34.8–46.6)
Hemoglobin: 11.3 g/dL — ABNORMAL LOW (ref 11.6–15.9)
Lymphocytes Relative: 20 %
Lymphs Abs: 1.2 10*3/uL (ref 0.9–3.3)
MCH: 33.8 pg (ref 25.1–34.0)
MCHC: 33.4 g/dL (ref 31.5–36.0)
MCV: 101.2 fL — ABNORMAL HIGH (ref 79.5–101.0)
MONO ABS: 0.7 10*3/uL (ref 0.1–0.9)
Monocytes Relative: 12 %
NEUTROS PCT: 63 %
Neutro Abs: 3.8 10*3/uL (ref 1.5–6.5)
Platelet Count: 320 10*3/uL (ref 145–400)
RBC: 3.34 MIL/uL — AB (ref 3.70–5.45)
RDW: 21.2 % — AB (ref 11.2–14.5)
WBC Count: 6 10*3/uL (ref 3.9–10.3)

## 2017-11-30 LAB — TSH: TSH: 3.132 u[IU]/mL (ref 0.308–3.960)

## 2017-11-30 MED ORDER — SODIUM CHLORIDE 0.9% FLUSH
10.0000 mL | INTRAVENOUS | Status: DC | PRN
  Administered 2017-11-30: 10 mL
  Filled 2017-11-30: qty 10

## 2017-11-30 MED ORDER — HEPARIN SOD (PORK) LOCK FLUSH 100 UNIT/ML IV SOLN
500.0000 [IU] | Freq: Once | INTRAVENOUS | Status: AC | PRN
  Administered 2017-11-30: 500 [IU]
  Filled 2017-11-30: qty 5

## 2017-11-30 MED ORDER — ONDANSETRON HCL 4 MG/2ML IJ SOLN
8.0000 mg | Freq: Once | INTRAMUSCULAR | Status: AC
  Administered 2017-11-30: 8 mg via INTRAVENOUS

## 2017-11-30 MED ORDER — DEXAMETHASONE SODIUM PHOSPHATE 10 MG/ML IJ SOLN
10.0000 mg | Freq: Once | INTRAMUSCULAR | Status: AC
  Administered 2017-11-30: 10 mg via INTRAVENOUS

## 2017-11-30 MED ORDER — CYANOCOBALAMIN 1000 MCG/ML IJ SOLN
INTRAMUSCULAR | Status: AC
  Filled 2017-11-30: qty 1

## 2017-11-30 MED ORDER — ONDANSETRON HCL 4 MG/2ML IJ SOLN
INTRAMUSCULAR | Status: AC
  Filled 2017-11-30: qty 4

## 2017-11-30 MED ORDER — CYANOCOBALAMIN 1000 MCG/ML IJ SOLN
1000.0000 ug | Freq: Once | INTRAMUSCULAR | Status: AC
  Administered 2017-11-30: 1000 ug via INTRAMUSCULAR

## 2017-11-30 MED ORDER — DEXAMETHASONE SODIUM PHOSPHATE 10 MG/ML IJ SOLN
INTRAMUSCULAR | Status: AC
  Filled 2017-11-30: qty 1

## 2017-11-30 MED ORDER — SODIUM CHLORIDE 0.9 % IV SOLN
Freq: Once | INTRAVENOUS | Status: AC
  Administered 2017-11-30: 12:00:00 via INTRAVENOUS
  Filled 2017-11-30: qty 250

## 2017-11-30 MED ORDER — SODIUM CHLORIDE 0.9 % IV SOLN
490.0000 mg/m2 | Freq: Once | INTRAVENOUS | Status: AC
  Administered 2017-11-30: 1000 mg via INTRAVENOUS
  Filled 2017-11-30: qty 40

## 2017-11-30 MED ORDER — SODIUM CHLORIDE 0.9 % IV SOLN
200.0000 mg | Freq: Once | INTRAVENOUS | Status: AC
  Administered 2017-11-30: 200 mg via INTRAVENOUS
  Filled 2017-11-30: qty 8

## 2017-11-30 MED ORDER — SODIUM CHLORIDE 0.9 % IV SOLN
Freq: Once | INTRAVENOUS | Status: AC
  Filled 2017-11-30: qty 250

## 2017-11-30 NOTE — Telephone Encounter (Signed)
Appts already scheduled per 9/19 los. -- no additional appts added.

## 2017-11-30 NOTE — Progress Notes (Signed)
[facility] [phone]   [phone]  OFFICE PROGRESS NOTE  [name], MD [place] [address]  DIAGNOSIS:  1) Stage IV (T3, N0, M1 a)non-small cell lung cancer, well-differentiated adenocarcinoma presented with multifocal disease involving mainly the right upper lobe as well as the right middle lobe and left lower lobe of the lung diagnosed in May 2019. 2) bilateral small pulmonary emboli diagnosed incidentally on CT scan of the chest on October 16, 2017  Guardant 360 testing was negative for actionable mutations.  PRIOR THERAPY:None  CURRENT THERAPY: 1) Carboplatin for an AUC of 5, Alimta 500 mg/m2 and Keytruda 200 mg IV given every 3 weeks. First dosegiven on 08/18/2017.Status post 5 cycles. 2) Xarelto 15 mg p.o. twice daily for the first 3 weeks followed by 20 mg p.o. Daily.  INTERVAL HISTORY: Erin Myers 77 y.o. female returns to the clinic today for follow-up visit accompanied by her husband.  The patient is feeling fine today with no concerning complaints except for the baseline shortness of breath and she is currently on home oxygen.  She tolerated the last cycle of her treatment with Alimta and Ketruda (pembrolizumab) fairly well except for mild fatigue few days after the treatment.  She denied having any chest pain, cough or hemoptysis.  She denied having any nausea, vomiting, diarrhea or constipation.  She has no fever or chills.  She is here today for evaluation before starting cycle #6.  MEDICAL HISTORY: Past Medical History:  Diagnosis Date  . Anxiety   . Diabetes mellitus without complication ([place])   . Dyslipidemia   . HTN (hypertension)   . Hyperlipemia   . Hypothyroid   . Hypothyroidism   . Lung cancer ([place]) 2000   Carcinoid, s/p RLL lobectomy  . Obesity   . Ovarian cancer ([place] [facility])   . Palpitations 2009 and 2006   Negative nuclear stress test   . Sleep apnea    on C-pap     ALLERGIES:  is allergic to adhesive [tape]; codeine; lactose intolerance (gi); monosodium glutamate; and sulfa antibiotics.  MEDICATIONS:  Current Outpatient Medications  Medication Sig Dispense Refill  . acetaminophen (TYLENOL) 500 MG tablet Take 500 mg by mouth every 6 (six) hours as needed.    . benzonatate (TESSALON) 200 MG capsule Take 1 capsule (200 mg total) by mouth 3 (three) times daily as needed for cough. 30 capsule 1  . budesonide-formoterol (SYMBICORT) 160-4.5 MCG/ACT inhaler Inhale 2 puffs into the lungs 2 (two) times daily. 1 Inhaler 6  . clorazepate (TRANXENE) 7.5 MG tablet Take 7.5 mg by mouth 2 (two) times daily as needed for anxiety.    . folic acid (FOLVITE) 1 MG tablet TAKE 1 TABLET BY MOUTH DAILY. 30 tablet 2  . guaiFENesin-codeine (CHERATUSSIN AC) 100-10 MG/5ML syrup Take 5 mLs by mouth every 6 (six) hours as needed for cough. 240 mL 0  . ipratropium-albuterol (DUONEB) 0.5-2.5 (3) MG/3ML SOLN Take 3 mLs by nebulization 3 (three) times daily. 360 mL 2  . levothyroxine (SYNTHROID, LEVOTHROID) 112 MCG tablet Take 112 mcg by mouth daily before breakfast.    . lidocaine-prilocaine (EMLA) cream Apply 1 application topically as needed. 30 g 0  . losartan-hydrochlorothiazide (HYZAAR) 50-12.5 MG per tablet Take 1 tablet by mouth every morning.     . metoprolol succinate (TOPROL-XL) 50 MG 24 hr tablet Take 50 mg by mouth 2 (two) times daily.     . potassium chloride SA (K-DUR,KLOR-CON)  20 MEQ tablet Take 1 tablet (20 mEq total) by mouth daily. 7 tablet 0  . prochlorperazine (COMPAZINE) 10 MG tablet Take 1 tablet (10 mg total) by mouth every 6 (six) hours as needed for nausea or vomiting. 30 tablet 0  . rivaroxaban (XARELTO) 20 MG TABS tablet Take 1 tablet (20 mg total) by mouth daily with supper. 30 tablet 1  . rosuvastatin (CRESTOR) 10 MG tablet Take 10 mg by mouth every evening.     . valACYclovir (VALTREX) 1000 MG tablet Take 500 mg by mouth daily.   0   No current  facility-administered medications for this visit.     SURGICAL HISTORY:  Past Surgical History:  Procedure Laterality Date  . ABDOMINAL HYSTERECTOMY  2001  . GRADED EXERCISE TOLERANCE TEST  08/2015    Blood pressure demonstrated a hypertensive response to exercise.  There was less than 0.44mm of horizontal ST segment depression in the inferolateral leads. No diagnostic criterial for ischemia. Moderately impaired exercise tolerance. The patient achieved 5.8 mets. This is a low risk study.  . IR FLUORO GUIDE PORT INSERTION RIGHT  08/17/2017  . IR US GUIDE VASC ACCESS RIGHT  08/17/2017  . LOBECTOMY  2000   RLL  . NM MYOCAR PERF WALL MOTION  08/20/2007   EF 74%  EXERCISE CAPACITY 7 METS  . OOPHORECTOMY  2001  . TONSILLECTOMY    . VIDEO BRONCHOSCOPY Bilateral 07/20/2017   Procedure: VIDEO BRONCHOSCOPY WITH FLUORO;  Surgeon: Collene Gobble, MD;  Location: [facility];  Service: Cardiopulmonary;  Laterality: Bilateral;    REVIEW OF SYSTEMS:  A comprehensive review of systems was negative except for: Constitutional: positive for fatigue Respiratory: positive for dyspnea on exertion   PHYSICAL EXAMINATION: General appearance: alert, cooperative, fatigued and no distress Head: Normocephalic, without obvious abnormality, atraumatic Neck: no adenopathy, no JVD, supple, symmetrical, trachea midline and thyroid not enlarged, symmetric, no tenderness/mass/nodules Lymph nodes: Cervical, supraclavicular, and axillary nodes normal. Resp: rales bilaterally and wheezes bilaterally Back: symmetric, no curvature. ROM normal. No CVA tenderness. Cardio: regular rate and rhythm, S1, S2 normal, no murmur, click, rub or gallop GI: soft, non-tender; bowel sounds normal; no masses,  no organomegaly Extremities: extremities normal, atraumatic, no cyanosis or edema  ECOG PERFORMANCE STATUS: 1 - Symptomatic but completely ambulatory  Blood pressure (!) 121/53, pulse 85, temperature 98 F (36.7 C), temperature source  Oral, resp. rate 19, height 5' 4.25" (1.632 m), weight 192 lb 6.4 oz (87.3 kg), SpO2 93 %.  LABORATORY DATA: Lab Results  Component Value Date   WBC 6.0 11/30/2017   HGB 11.3 (L) 11/30/2017   HCT 33.8 (L) 11/30/2017   MCV 101.2 (H) 11/30/2017   PLT 320 11/30/2017      Chemistry      Component Value Date/Time   NA 143 11/09/2017 0900   K 3.1 (L) 11/09/2017 0900   CL 101 11/09/2017 0900   CO2 31 11/09/2017 0900   BUN 10 11/09/2017 0900   CREATININE 0.83 11/09/2017 0900      Component Value Date/Time   CALCIUM 9.6 11/09/2017 0900   ALKPHOS 55 11/09/2017 0900   AST 17 11/09/2017 0900   ALT 8 11/09/2017 0900   BILITOT 0.4 11/09/2017 0900       RADIOGRAPHIC STUDIES: No results found.  ASSESSMENT AND PLAN: This is a very pleasant 77 years old white female with stage IV non-small cell lung cancer, adenocarcinoma with no actionable mutations. She is currently undergoing systemic chemotherapy with carboplatin, Alimta and  Ketruda (pembrolizumab) status post 5 cycles.  Starting from cycle #5 she is on maintenance treatment with Alimta and Ketruda (pembrolizumab). The patient has been tolerating this treatment well with no concerning complaints. I recommended for her to proceed with cycle #6 today as a schedule. I will see the patient back for follow-up visit in 3 weeks for evaluation with repeat CT scan of the chest, abdomen and pelvis before starting cycle #7. For the recently diagnosed the small bilateral pulmonary emboli, the patient was a started on treatment with Xarelto and currently on 15 mg p.o. twice daily for the first 3 weeks followed by 20 mg p.o. daily. She was advised to call immediately if she has any concerning symptoms in the interval. The patient voices understanding of current disease status and treatment options and is in agreement with the current care plan.  All questions were answered. The patient knows to call the clinic with any problems, questions or  concerns. We can certainly see the patient much sooner if necessary.  Disclaimer: This note was dictated with voice recognition software. Similar sounding words can inadvertently be transcribed and may not be corrected upon review.

## 2017-12-06 ENCOUNTER — Other Ambulatory Visit: Payer: Self-pay | Admitting: Pulmonary Disease

## 2017-12-07 DIAGNOSIS — J449 Chronic obstructive pulmonary disease, unspecified: Secondary | ICD-10-CM | POA: Diagnosis not present

## 2017-12-14 ENCOUNTER — Ambulatory Visit (HOSPITAL_COMMUNITY)
Admission: RE | Admit: 2017-12-14 | Discharge: 2017-12-14 | Disposition: A | Discharge status: Home / Self Care | Payer: PPO | Source: Ambulatory Visit | Attending: Internal Medicine | Admitting: Internal Medicine

## 2017-12-14 DIAGNOSIS — C3490 Malignant neoplasm of unspecified part of unspecified bronchus or lung: Secondary | ICD-10-CM | POA: Diagnosis present

## 2017-12-14 DIAGNOSIS — C3491 Malignant neoplasm of unspecified part of right bronchus or lung: Secondary | ICD-10-CM | POA: Diagnosis not present

## 2017-12-14 DIAGNOSIS — Z5111 Encounter for antineoplastic chemotherapy: Secondary | ICD-10-CM | POA: Diagnosis not present

## 2017-12-14 MED ORDER — SODIUM CHLORIDE 0.9 % IJ SOLN
INTRAMUSCULAR | Status: AC
  Filled 2017-12-14: qty 150

## 2017-12-14 MED ORDER — IOHEXOL 300 MG/ML  SOLN
100.0000 mL | Freq: Once | INTRAMUSCULAR | Status: AC | PRN
  Administered 2017-12-14: 100 mL via INTRAVENOUS

## 2017-12-14 MED ORDER — IOPAMIDOL (ISOVUE-300) INJECTION 61%: 100.0000 mL | Freq: Once | INTRAVENOUS | Status: DC | PRN

## 2017-12-19 ENCOUNTER — Inpatient Hospital Stay: Payer: PPO

## 2017-12-19 ENCOUNTER — Telehealth: Payer: Self-pay | Admitting: Oncology

## 2017-12-19 ENCOUNTER — Inpatient Hospital Stay: Payer: PPO | Attending: Internal Medicine

## 2017-12-19 ENCOUNTER — Encounter: Payer: Self-pay | Admitting: Oncology

## 2017-12-19 ENCOUNTER — Inpatient Hospital Stay (HOSPITAL_BASED_OUTPATIENT_CLINIC_OR_DEPARTMENT_OTHER): Payer: PPO | Admitting: Oncology

## 2017-12-19 VITALS — BP 126/56 | HR 80 | Temp 98.6°F | Resp 18 | Ht 64.25 in | Wt 193.5 lb

## 2017-12-19 DIAGNOSIS — C3491 Malignant neoplasm of unspecified part of right bronchus or lung: Secondary | ICD-10-CM

## 2017-12-19 DIAGNOSIS — F419 Anxiety disorder, unspecified: Secondary | ICD-10-CM | POA: Diagnosis not present

## 2017-12-19 DIAGNOSIS — I2699 Other pulmonary embolism without acute cor pulmonale: Secondary | ICD-10-CM | POA: Insufficient documentation

## 2017-12-19 DIAGNOSIS — Z5111 Encounter for antineoplastic chemotherapy: Secondary | ICD-10-CM | POA: Insufficient documentation

## 2017-12-19 DIAGNOSIS — E785 Hyperlipidemia, unspecified: Secondary | ICD-10-CM

## 2017-12-19 DIAGNOSIS — E039 Hypothyroidism, unspecified: Secondary | ICD-10-CM | POA: Diagnosis not present

## 2017-12-19 DIAGNOSIS — I10 Essential (primary) hypertension: Secondary | ICD-10-CM | POA: Diagnosis not present

## 2017-12-19 DIAGNOSIS — G4730 Sleep apnea, unspecified: Secondary | ICD-10-CM | POA: Diagnosis not present

## 2017-12-19 DIAGNOSIS — E119 Type 2 diabetes mellitus without complications: Secondary | ICD-10-CM | POA: Insufficient documentation

## 2017-12-19 DIAGNOSIS — Z79899 Other long term (current) drug therapy: Secondary | ICD-10-CM | POA: Diagnosis not present

## 2017-12-19 DIAGNOSIS — Z8543 Personal history of malignant neoplasm of ovary: Secondary | ICD-10-CM | POA: Diagnosis not present

## 2017-12-19 DIAGNOSIS — Z5112 Encounter for antineoplastic immunotherapy: Secondary | ICD-10-CM | POA: Diagnosis not present

## 2017-12-19 DIAGNOSIS — Z95828 Presence of other vascular implants and grafts: Secondary | ICD-10-CM

## 2017-12-19 DIAGNOSIS — Z7901 Long term (current) use of anticoagulants: Secondary | ICD-10-CM

## 2017-12-19 DIAGNOSIS — R590 Localized enlarged lymph nodes: Secondary | ICD-10-CM | POA: Insufficient documentation

## 2017-12-19 DIAGNOSIS — E669 Obesity, unspecified: Secondary | ICD-10-CM | POA: Insufficient documentation

## 2017-12-19 LAB — CBC WITH DIFFERENTIAL (CANCER CENTER ONLY)
Abs Immature Granulocytes: 0.05 10*3/uL (ref 0.00–0.07)
BASOS ABS: 0 10*3/uL (ref 0.0–0.1)
Basophils Relative: 0 %
EOS PCT: 2 %
Eosinophils Absolute: 0.1 10*3/uL (ref 0.0–0.5)
HCT: 33.6 % — ABNORMAL LOW (ref 36.0–46.0)
HEMOGLOBIN: 10.7 g/dL — AB (ref 12.0–15.0)
IMMATURE GRANULOCYTES: 1 %
Lymphocytes Relative: 16 %
Lymphs Abs: 1.2 10*3/uL (ref 0.7–4.0)
MCH: 33.9 pg (ref 26.0–34.0)
MCHC: 31.8 g/dL (ref 30.0–36.0)
MCV: 106.3 fL — ABNORMAL HIGH (ref 80.0–100.0)
Monocytes Absolute: 0.7 10*3/uL (ref 0.1–1.0)
Monocytes Relative: 10 %
NEUTROS ABS: 5.6 10*3/uL (ref 1.7–7.7)
NEUTROS PCT: 71 %
NRBC: 0 % (ref 0.0–0.2)
Platelet Count: 208 10*3/uL (ref 150–400)
RBC: 3.16 MIL/uL — AB (ref 3.87–5.11)
RDW: 17.2 % — AB (ref 11.5–15.5)
WBC: 7.7 10*3/uL (ref 4.0–10.5)

## 2017-12-19 LAB — CMP (CANCER CENTER ONLY)
ALT: 14 U/L (ref 0–44)
ANION GAP: 10 (ref 5–15)
AST: 17 U/L (ref 15–41)
Albumin: 3.2 g/dL — ABNORMAL LOW (ref 3.5–5.0)
Alkaline Phosphatase: 64 U/L (ref 38–126)
BUN: 11 mg/dL (ref 8–23)
CHLORIDE: 103 mmol/L (ref 98–111)
CO2: 33 mmol/L — ABNORMAL HIGH (ref 22–32)
CREATININE: 0.81 mg/dL (ref 0.44–1.00)
Calcium: 9.6 mg/dL (ref 8.9–10.3)
Glucose, Bld: 137 mg/dL — ABNORMAL HIGH (ref 70–99)
POTASSIUM: 3.5 mmol/L (ref 3.5–5.1)
Sodium: 146 mmol/L — ABNORMAL HIGH (ref 135–145)
Total Bilirubin: 0.4 mg/dL (ref 0.3–1.2)
Total Protein: 6.8 g/dL (ref 6.5–8.1)

## 2017-12-19 LAB — TSH: TSH: 2.486 u[IU]/mL (ref 0.308–3.960)

## 2017-12-19 MED ORDER — SODIUM CHLORIDE 0.9% FLUSH
10.0000 mL | INTRAVENOUS | Status: DC | PRN
  Administered 2017-12-19: 10 mL
  Filled 2017-12-19: qty 10

## 2017-12-19 MED ORDER — DEXAMETHASONE SODIUM PHOSPHATE 10 MG/ML IJ SOLN
10.0000 mg | Freq: Once | INTRAMUSCULAR | Status: AC
  Administered 2017-12-19: 10 mg via INTRAVENOUS

## 2017-12-19 MED ORDER — ONDANSETRON HCL 4 MG/2ML IJ SOLN
INTRAMUSCULAR | Status: AC
  Filled 2017-12-19: qty 4

## 2017-12-19 MED ORDER — HEPARIN SOD (PORK) LOCK FLUSH 100 UNIT/ML IV SOLN
500.0000 [IU] | Freq: Once | INTRAVENOUS | Status: AC | PRN
  Administered 2017-12-19: 500 [IU]
  Filled 2017-12-19: qty 5

## 2017-12-19 MED ORDER — CYANOCOBALAMIN 1000 MCG/ML IJ SOLN: 1000.0000 ug | Freq: Once | INTRAMUSCULAR | Status: DC

## 2017-12-19 MED ORDER — SODIUM CHLORIDE 0.9 % IV SOLN
Freq: Once | INTRAVENOUS | Status: AC
  Administered 2017-12-19: 12:00:00 via INTRAVENOUS
  Filled 2017-12-19: qty 250

## 2017-12-19 MED ORDER — SODIUM CHLORIDE 0.9 % IV SOLN
200.0000 mg | Freq: Once | INTRAVENOUS | Status: AC
  Administered 2017-12-19: 200 mg via INTRAVENOUS
  Filled 2017-12-19: qty 8

## 2017-12-19 MED ORDER — SODIUM CHLORIDE 0.9 % IV SOLN
490.0000 mg/m2 | Freq: Once | INTRAVENOUS | Status: AC
  Administered 2017-12-19: 1000 mg via INTRAVENOUS
  Filled 2017-12-19: qty 40

## 2017-12-19 MED ORDER — ONDANSETRON HCL 4 MG/2ML IJ SOLN
8.0000 mg | Freq: Once | INTRAMUSCULAR | Status: AC
  Administered 2017-12-19: 8 mg via INTRAVENOUS

## 2017-12-19 MED ORDER — DEXAMETHASONE SODIUM PHOSPHATE 10 MG/ML IJ SOLN
INTRAMUSCULAR | Status: AC
  Filled 2017-12-19: qty 1

## 2017-12-19 NOTE — Progress Notes (Signed)
Erin Myers  Erin Battles, MD [place] [address]  DIAGNOSIS:  1) Stage IV (T3, N0, M1 a)non-small cell lung cancer, well-differentiated adenocarcinoma presented with multifocal disease involving mainly the right upper lobe as well as the right middle lobe and left lower lobe of the lung diagnosed in May 2019. 2) bilateral small pulmonary emboli diagnosed incidentally on CT scan of the chest on October 16, 2017  Guardant 360 testing was negative for actionable mutations.  PRIOR THERAPY:None  CURRENT THERAPY: 1) Carboplatin for an AUC of 5, Alimta 500 mg/m2 and Keytruda 200 mg IV given every 3 weeks. First dosegiven on 08/18/2017.Status post 6cycles. 2) Xarelto 15 mg p.o. twice daily for the first 3 weeks followed by 20 mg p.o. Daily.  INTERVAL HISTORY: Erin Myers 77 y.o. female returns for routine follow-up visit accompanied by her husband.  The patient is feeling fine today and has no specific complaints except her baseline shortness of breath.  She continues to wear home oxygen.  She denies fevers and chills.  Denies chest pain and hemoptysis.  She has her baseline cough which is unchanged.  Denies nausea, vomiting, constipation, diarrhea.  Denies recent weight loss or night sweats.  The patient had a restaging CT scan and is here to discuss the results.  MEDICAL HISTORY: Past Medical History:  Diagnosis Date  . Anxiety   . Diabetes mellitus without complication ([place])   . Dyslipidemia   . HTN (hypertension)   . Hyperlipemia   . Hypothyroid   . Hypothyroidism   . Lung cancer ([place]) 2000   Carcinoid, s/p RLL lobectomy  . Obesity   . Ovarian cancer ([place])   . Palpitations 2009 and 2006   Negative nuclear stress test   . Sleep apnea    on C-pap    ALLERGIES:  is allergic to adhesive [tape]; codeine; lactose intolerance (gi); monosodium glutamate; and sulfa antibiotics.  MEDICATIONS:  Current Outpatient  Medications  Medication Sig Dispense Refill  . acetaminophen (TYLENOL) 500 MG tablet Take 500 mg by mouth every 6 (six) hours as needed.    . benzonatate (TESSALON) 200 MG capsule Take 1 capsule (200 mg total) by mouth 3 (three) times daily as needed for cough. 30 capsule 1  . budesonide-formoterol (SYMBICORT) 160-4.5 MCG/ACT inhaler Inhale 2 puffs into the lungs 2 (two) times daily. 1 Inhaler 6  . clorazepate (TRANXENE) 7.5 MG tablet Take 7.5 mg by mouth 2 (two) times daily as needed for anxiety.    . folic acid (FOLVITE) 1 MG tablet TAKE 1 TABLET BY MOUTH DAILY. 30 tablet 2  . guaiFENesin-codeine (CHERATUSSIN AC) 100-10 MG/5ML syrup Take 5 mLs by mouth every 6 (six) hours as needed for cough. 240 mL 0  . ipratropium-albuterol (DUONEB) 0.5-2.5 (3) MG/3ML SOLN INHALE THE CONTENTS OF 1 VIAL VIA NEBULIZER 3 TIMES A DAY. 360 mL 2  . levothyroxine (SYNTHROID, LEVOTHROID) 125 MCG tablet Take 125 mcg by mouth daily before breakfast.    . losartan-hydrochlorothiazide (HYZAAR) 50-12.5 MG per tablet Take 1 tablet by mouth every morning.     . metoprolol succinate (TOPROL-XL) 50 MG 24 hr tablet Take 50 mg by mouth 2 (two) times daily.     . potassium chloride SA (K-DUR,KLOR-CON) 20 MEQ tablet Take 1 tablet (20 mEq total) by mouth daily. 7 tablet 0  . rivaroxaban (XARELTO) 20 MG TABS tablet Take 1 tablet (20 mg total) by mouth daily with supper. 30 tablet 1  .  rosuvastatin (CRESTOR) 10 MG tablet Take 10 mg by mouth every evening.     . valACYclovir (VALTREX) 500 MG tablet Take 500 mg by mouth daily.    [name] lidocaine-prilocaine (EMLA) cream Apply 1 application topically as needed. (Patient not taking: Reported on 12/19/2017) 30 g 0  . prochlorperazine (COMPAZINE) 10 MG tablet Take 1 tablet (10 mg total) by mouth every 6 (six) hours as needed for nausea or vomiting. (Patient not taking: Reported on 12/19/2017) 30 tablet 0   No current facility-administered medications for this visit.    Facility-Administered  Medications Ordered in Other Visits  Medication Dose Route Frequency Provider Last Rate Last Dose  . sodium chloride flush (NS) 0.9 % injection 10 mL  10 mL Intracatheter PRN Curt Bears, MD   10 mL at 12/19/17 1408    SURGICAL HISTORY:  Past Surgical History:  Procedure Laterality Date  . ABDOMINAL HYSTERECTOMY  2001  . GRADED EXERCISE TOLERANCE TEST  08/2015    Blood pressure demonstrated a hypertensive response to exercise.  There was less than 0.97mm of horizontal ST segment depression in the inferolateral leads. No diagnostic criterial for ischemia. Moderately impaired exercise tolerance. The patient achieved 5.8 mets. This is a low risk study.  . IR FLUORO GUIDE PORT INSERTION RIGHT  08/17/2017  . IR US GUIDE VASC ACCESS RIGHT  08/17/2017  . LOBECTOMY  2000   RLL  . NM MYOCAR PERF WALL MOTION  08/20/2007   EF 74%  EXERCISE CAPACITY 7 METS  . OOPHORECTOMY  2001  . TONSILLECTOMY    . VIDEO BRONCHOSCOPY Bilateral 07/20/2017   Procedure: VIDEO BRONCHOSCOPY WITH FLUORO;  Surgeon: Collene Gobble, MD;  Location: [facility];  Service: Cardiopulmonary;  Laterality: Bilateral;    REVIEW OF SYSTEMS:   Review of Systems  Constitutional: Negative for appetite change, chills, fatigue, fever and unexpected weight change.  HENT:   Negative for mouth sores, nosebleeds, sore throat and trouble swallowing.   Eyes: Negative for eye problems and icterus.  Respiratory: Negative for hemoptysis and wheezing.  She has an ongoing cough and her baseline shortness of breath.  She wears home oxygen. Cardiovascular: Negative for chest pain and leg swelling.  Gastrointestinal: Negative for abdominal pain, constipation, diarrhea, nausea and vomiting.  Genitourinary: Negative for bladder incontinence, difficulty urinating, dysuria, frequency and hematuria.   Musculoskeletal: Negative for back pain, gait problem, neck pain and neck stiffness.  Skin: Negative for itching and rash.  Neurological: Negative for  dizziness, extremity weakness, gait problem, headaches, light-headedness and seizures.  Hematological: Negative for adenopathy. Does not bruise/bleed easily.  Psychiatric/Behavioral: Negative for confusion, depression and sleep disturbance. The patient is not nervous/anxious.     PHYSICAL EXAMINATION:  Blood pressure (!) 126/56, pulse 80, temperature 98.6 F (37 C), temperature source Oral, resp. rate 18, height 5' 4.25" (1.632 m), weight 193 lb 8 oz (87.8 kg), SpO2 100 %.  ECOG PERFORMANCE STATUS: 1 - Symptomatic but completely ambulatory  Physical Exam  Constitutional: Oriented to person, place, and time and well-developed, well-nourished, and in no distress. No distress.  HENT:  Head: Normocephalic and atraumatic.  Mouth/Throat: Oropharynx is clear and moist. No oropharyngeal exudate.  Eyes: Conjunctivae are normal. Right eye exhibits no discharge. Left eye exhibits no discharge. No scleral icterus.  Neck: Normal range of motion. Neck supple.  Cardiovascular: Normal rate, regular rhythm, normal heart sounds and intact distal pulses.   Pulmonary/Chest: Effort normal.  Scattered rales noted. Abdominal: Soft. Bowel sounds are normal. Exhibits no distension  and no mass. There is no tenderness.  Musculoskeletal: Normal range of motion. Exhibits no edema.  Lymphadenopathy:    No cervical adenopathy.  Neurological: Alert and oriented to person, place, and time. Exhibits normal muscle tone. Gait normal. Coordination normal.  Skin: Skin is warm and dry. No rash noted. Not diaphoretic. No erythema. No pallor.  Psychiatric: Mood, memory and judgment normal.  Vitals reviewed.  LABORATORY DATA: Lab Results  Component Value Date   WBC 7.7 12/19/2017   HGB 10.7 (L) 12/19/2017   HCT 33.6 (L) 12/19/2017   MCV 106.3 (H) 12/19/2017   PLT 208 12/19/2017      Chemistry      Component Value Date/Time   NA 146 (H) 12/19/2017 1004   K 3.5 12/19/2017 1004   CL 103 12/19/2017 1004   CO2 33 (H)  12/19/2017 1004   BUN 11 12/19/2017 1004   CREATININE 0.81 12/19/2017 1004      Component Value Date/Time   CALCIUM 9.6 12/19/2017 1004   ALKPHOS 64 12/19/2017 1004   AST 17 12/19/2017 1004   ALT 14 12/19/2017 1004   BILITOT 0.4 12/19/2017 1004       RADIOGRAPHIC STUDIES:  Ct Chest W Contrast  Result Date: 12/14/2017 CLINICAL DATA:  Stage IV lung cancer per. RIGHT lung cancer. Chemotherapy ongoing EXAM: CT CHEST, ABDOMEN, AND PELVIS WITH CONTRAST TECHNIQUE: Multidetector CT imaging of the chest, abdomen and pelvis was performed following the standard protocol during bolus administration of intravenous contrast. CONTRAST:  144mL OMNIPAQUE IOHEXOL 300 MG/ML  SOLN COMPARISON:  CT 10/16/2017 FINDINGS: CT CHEST FINDINGS Cardiovascular: Port in the anterior chest wall with tip in distal SVC. Pulmonary emboli no longer identified. Mediastinum/Nodes: No axillary supraclavicular adenopathy. Subcarinal lymph node measures 12 mm (image 30/2) compared with 13 mm on prior. Small RIGHT hilar node and RIGHT paratracheal lymph nodes are unchanged. Lungs/Pleura: Consolidated pattern in the posterior aspect of the RIGHT upper lobe is decreased in volume. Lesion difficult to measure - approximately 2.5 by 3.7 cm compared to 4.2 x 4.9 cm. There is ground-glass opacities and bronchiectasis surrounding this consolidative pattern. In the lower lobes reticulonodular pattern is improved. No new measurable nodularity in LEFT olr  RIGHT lung. Musculoskeletal: No aggressive osseous lesion. CT ABDOMEN AND PELVIS FINDINGS Hepatobiliary: Mild nodularity to the liver. No focal hepatic lesion. Gallbladder normal Pancreas: No pancreatic lesion. Spleen: Normal spleen Adrenals/urinary tract: Adrenal glands normal. Kidneys, ureters bladder normal. Stomach/Bowel: Stomach, small bowel, appendix, and cecum are normal. The colon and rectosigmoid colon are normal. Vascular/Lymphatic: Abdominal aorta is normal caliber with atherosclerotic  calcification. There is no retroperitoneal or periportal lymphadenopathy. No pelvic lymphadenopathy. Reproductive: Post hysterectomy Other: No peritoneal metastasis Musculoskeletal: No aggressive osseous lesion. IMPRESSION: Chest Impression: 1. Interval decrease in volume of peripheral consolidation in the RIGHT upper lobe is most suggestive of improved post radiation change or improved pneumonia. 2. Interval decrease in the nodularity within the bibasilar reticulonodular pattern suggest improved infectious or inflammatory process. 3. No new or measurable nodularity. 4. Stable subcarinal adenopathy. Abdomen / Pelvis Impression: 1. No evidence of metastatic disease in the abdomen pelvis. Electronically Signed   By: Suzy Bouchard M.D.   On: 12/14/2017 17:25   Ct Abdomen Pelvis W Contrast  Result Date: 12/14/2017 CLINICAL DATA:  Stage IV lung cancer per. RIGHT lung cancer. Chemotherapy ongoing EXAM: CT CHEST, ABDOMEN, AND PELVIS WITH CONTRAST TECHNIQUE: Multidetector CT imaging of the chest, abdomen and pelvis was performed following the standard protocol during bolus administration of  intravenous contrast. CONTRAST:  122mL OMNIPAQUE IOHEXOL 300 MG/ML  SOLN COMPARISON:  CT 10/16/2017 FINDINGS: CT CHEST FINDINGS Cardiovascular: Port in the anterior chest wall with tip in distal SVC. Pulmonary emboli no longer identified. Mediastinum/Nodes: No axillary supraclavicular adenopathy. Subcarinal lymph node measures 12 mm (image 30/2) compared with 13 mm on prior. Small RIGHT hilar node and RIGHT paratracheal lymph nodes are unchanged. Lungs/Pleura: Consolidated pattern in the posterior aspect of the RIGHT upper lobe is decreased in volume. Lesion difficult to measure - approximately 2.5 by 3.7 cm compared to 4.2 x 4.9 cm. There is ground-glass opacities and bronchiectasis surrounding this consolidative pattern. In the lower lobes reticulonodular pattern is improved. No new measurable nodularity in LEFT olr  RIGHT lung.  Musculoskeletal: No aggressive osseous lesion. CT ABDOMEN AND PELVIS FINDINGS Hepatobiliary: Mild nodularity to the liver. No focal hepatic lesion. Gallbladder normal Pancreas: No pancreatic lesion. Spleen: Normal spleen Adrenals/urinary tract: Adrenal glands normal. Kidneys, ureters bladder normal. Stomach/Bowel: Stomach, small bowel, appendix, and cecum are normal. The colon and rectosigmoid colon are normal. Vascular/Lymphatic: Abdominal aorta is normal caliber with atherosclerotic calcification. There is no retroperitoneal or periportal lymphadenopathy. No pelvic lymphadenopathy. Reproductive: Post hysterectomy Other: No peritoneal metastasis Musculoskeletal: No aggressive osseous lesion. IMPRESSION: Chest Impression: 1. Interval decrease in volume of peripheral consolidation in the RIGHT upper lobe is most suggestive of improved post radiation change or improved pneumonia. 2. Interval decrease in the nodularity within the bibasilar reticulonodular pattern suggest improved infectious or inflammatory process. 3. No new or measurable nodularity. 4. Stable subcarinal adenopathy. Abdomen / Pelvis Impression: 1. No evidence of metastatic disease in the abdomen pelvis. Electronically Signed   By: Suzy Bouchard M.D.   On: 12/14/2017 17:25     ASSESSMENT/PLAN:  Adenocarcinoma of right lung, stage 4 (HCC) This is a very pleasant 77 year old white female with stage IV non-small cell lung cancer, adenocarcinoma with no actionable mutations. She is currently undergoing systemic chemotherapy with carboplatin, Alimta and Ketruda (pembrolizumab) status post 6 cycles.  Starting from cycle #5 she is on maintenance treatment with Alimta and Ketruda (pembrolizumab). The patient has been tolerating this treatment well with no concerning complaints. She had a restaging CT scan and is here to discuss the results.  The patient was seen with Dr. [name].  CT scan results were discussed with the patient and her husband  which showed no evidence of disease progression.  Recommend for her to proceed with cycle #7 of her treatment today as scheduled. She will follow-up in 3 weeks for evaluation prior to cycle #8.  For the small bilateral pulmonary emboli, the patient will continue on Xarelto 20 mg daily.  She was advised to call immediately if she has any concerning symptoms in the interval. The patient voices understanding of current disease status and treatment options and is in agreement with the current care plan.  All questions were answered. The patient knows to call the clinic with any problems, questions or concerns. We can certainly see the patient much sooner if necessary.   No orders of the defined types were placed in this encounter.    Mikey Bussing, DNP, AGPCNP-BC, AOCNP 12/19/17   ADDENDUM: Hematology/Oncology Attending: I had a face-to-face encounter with the patient.  I recommended her care plan.  This is a very pleasant 77 years old white female with a stage IV non-small cell lung cancer, adenocarcinoma with no actionable mutations status post 4 cycles of systemic chemotherapy with carboplatin, Alimta and Ketruda (pembrolizumab).  This  was followed by 2 cycles of maintenance treatment with Alimta and Ketruda (pembrolizumab).  The patient has been tolerating this treatment well with no concerning complaints.  She denied having any skin rash or diarrhea.  She denied having any nausea or vomiting. She had repeat CT scan of the chest, abdomen and pelvis performed recently. I personally and independently reviewed the scans and discussed the results with the patient and her husband.  Her scan continues to show improvement of her disease. I recommended for the patient to proceed with cycle #7 today as scheduled. I will see her back for follow-up visit in 3 weeks for evaluation before the next cycle of her treatment. She was advised to call immediately if she has any concerning symptoms in the  interval.  Disclaimer: This Myers was dictated with voice recognition software. Similar sounding words can inadvertently be transcribed and may be missed upon review. Eilleen Kempf, MD 12/20/17

## 2017-12-19 NOTE — Telephone Encounter (Signed)
3 cycles already scheduled per 10/8 los. - no additional appts added.

## 2017-12-19 NOTE — Assessment & Plan Note (Addendum)
This is a very pleasant 77 year old white female with stage IV non-small cell lung cancer, adenocarcinoma with no actionable mutations. She is currently undergoing systemic chemotherapy with carboplatin, Alimta and Ketruda (pembrolizumab) status post 6 cycles.  Starting from cycle #5 she is on maintenance treatment with Alimta and Ketruda (pembrolizumab). The patient has been tolerating this treatment well with no concerning complaints. She had a restaging CT scan and is here to discuss the results.  The patient was seen with Dr. [name].  CT scan results were discussed with the patient and her husband which showed no evidence of disease progression.  Recommend for her to proceed with cycle #7 of her treatment today as scheduled. She will follow-up in 3 weeks for evaluation prior to cycle #8.  For the small bilateral pulmonary emboli, the patient will continue on Xarelto 20 mg daily.  She was advised to call immediately if she has any concerning symptoms in the interval. The patient voices understanding of current disease status and treatment options and is in agreement with the current care plan.  All questions were answered. The patient knows to call the clinic with any problems, questions or concerns. We can certainly see the patient much sooner if necessary.

## 2017-12-19 NOTE — Patient Instructions (Signed)
[facility] Discharge Instructions for Patients Receiving Chemotherapy  Today you received the following chemotherapy agents: alimta and keytruda.  To help prevent nausea and vomiting after your treatment, we encourage you to take your nausea medication as directed.   If you develop nausea and vomiting that is not controlled by your nausea medication, call the clinic.   BELOW ARE SYMPTOMS THAT SHOULD BE REPORTED IMMEDIATELY:  *FEVER GREATER THAN 100.5 F  *CHILLS WITH OR WITHOUT FEVER  NAUSEA AND VOMITING THAT IS NOT CONTROLLED WITH YOUR NAUSEA MEDICATION  *UNUSUAL SHORTNESS OF BREATH  *UNUSUAL BRUISING OR BLEEDING  TENDERNESS IN MOUTH AND THROAT WITH OR WITHOUT PRESENCE OF ULCERS  *URINARY PROBLEMS  *BOWEL PROBLEMS  UNUSUAL RASH Items with * indicate a potential emergency and should be followed up as soon as possible.  Feel free to call the clinic should you have any questions or concerns. The clinic phone number is [phone].  Please show the [facility] at check-in to the Emergency Department and triage nurse.

## 2018-01-06 DIAGNOSIS — J449 Chronic obstructive pulmonary disease, unspecified: Secondary | ICD-10-CM | POA: Diagnosis not present

## 2018-01-08 DIAGNOSIS — H3562 Retinal hemorrhage, left eye: Secondary | ICD-10-CM | POA: Diagnosis not present

## 2018-01-10 ENCOUNTER — Other Ambulatory Visit: Payer: Self-pay | Admitting: Internal Medicine

## 2018-01-10 DIAGNOSIS — I2699 Other pulmonary embolism without acute cor pulmonale: Secondary | ICD-10-CM

## 2018-01-11 ENCOUNTER — Inpatient Hospital Stay: Payer: PPO

## 2018-01-11 ENCOUNTER — Inpatient Hospital Stay: Payer: PPO | Admitting: Nutrition

## 2018-01-11 ENCOUNTER — Inpatient Hospital Stay (HOSPITAL_BASED_OUTPATIENT_CLINIC_OR_DEPARTMENT_OTHER): Payer: PPO | Admitting: Internal Medicine

## 2018-01-11 ENCOUNTER — Encounter: Payer: Self-pay | Admitting: Internal Medicine

## 2018-01-11 VITALS — BP 125/39 | HR 91 | Temp 98.0°F | Resp 24 | Ht 64.25 in | Wt 193.0 lb

## 2018-01-11 DIAGNOSIS — E039 Hypothyroidism, unspecified: Secondary | ICD-10-CM

## 2018-01-11 DIAGNOSIS — Z5112 Encounter for antineoplastic immunotherapy: Secondary | ICD-10-CM

## 2018-01-11 DIAGNOSIS — Z7901 Long term (current) use of anticoagulants: Secondary | ICD-10-CM

## 2018-01-11 DIAGNOSIS — E1151 Type 2 diabetes mellitus with diabetic peripheral angiopathy without gangrene: Secondary | ICD-10-CM | POA: Diagnosis not present

## 2018-01-11 DIAGNOSIS — F419 Anxiety disorder, unspecified: Secondary | ICD-10-CM

## 2018-01-11 DIAGNOSIS — C3412 Malignant neoplasm of upper lobe, left bronchus or lung: Secondary | ICD-10-CM

## 2018-01-11 DIAGNOSIS — Z95828 Presence of other vascular implants and grafts: Secondary | ICD-10-CM

## 2018-01-11 DIAGNOSIS — E669 Obesity, unspecified: Secondary | ICD-10-CM | POA: Diagnosis not present

## 2018-01-11 DIAGNOSIS — C3491 Malignant neoplasm of unspecified part of right bronchus or lung: Secondary | ICD-10-CM

## 2018-01-11 DIAGNOSIS — R590 Localized enlarged lymph nodes: Secondary | ICD-10-CM | POA: Diagnosis not present

## 2018-01-11 DIAGNOSIS — I2699 Other pulmonary embolism without acute cor pulmonale: Secondary | ICD-10-CM | POA: Diagnosis not present

## 2018-01-11 DIAGNOSIS — I10 Essential (primary) hypertension: Secondary | ICD-10-CM | POA: Diagnosis not present

## 2018-01-11 DIAGNOSIS — Z6833 Body mass index (BMI) 33.0-33.9, adult: Secondary | ICD-10-CM | POA: Diagnosis not present

## 2018-01-11 DIAGNOSIS — Z5111 Encounter for antineoplastic chemotherapy: Secondary | ICD-10-CM | POA: Diagnosis not present

## 2018-01-11 DIAGNOSIS — G4730 Sleep apnea, unspecified: Secondary | ICD-10-CM | POA: Diagnosis not present

## 2018-01-11 DIAGNOSIS — E785 Hyperlipidemia, unspecified: Secondary | ICD-10-CM

## 2018-01-11 DIAGNOSIS — E119 Type 2 diabetes mellitus without complications: Secondary | ICD-10-CM | POA: Diagnosis not present

## 2018-01-11 DIAGNOSIS — G4733 Obstructive sleep apnea (adult) (pediatric): Secondary | ICD-10-CM | POA: Diagnosis not present

## 2018-01-11 DIAGNOSIS — Z8543 Personal history of malignant neoplasm of ovary: Secondary | ICD-10-CM

## 2018-01-11 DIAGNOSIS — Z79899 Other long term (current) drug therapy: Secondary | ICD-10-CM

## 2018-01-11 LAB — CBC WITH DIFFERENTIAL (CANCER CENTER ONLY)
Abs Immature Granulocytes: 0.03 10*3/uL (ref 0.00–0.07)
BASOS PCT: 1 %
Basophils Absolute: 0 10*3/uL (ref 0.0–0.1)
EOS ABS: 0.2 10*3/uL (ref 0.0–0.5)
EOS PCT: 2 %
HCT: 34.8 % — ABNORMAL LOW (ref 36.0–46.0)
Hemoglobin: 11 g/dL — ABNORMAL LOW (ref 12.0–15.0)
Immature Granulocytes: 0 %
Lymphocytes Relative: 21 %
Lymphs Abs: 1.4 10*3/uL (ref 0.7–4.0)
MCH: 34.2 pg — AB (ref 26.0–34.0)
MCHC: 31.6 g/dL (ref 30.0–36.0)
MCV: 108.1 fL — ABNORMAL HIGH (ref 80.0–100.0)
Monocytes Absolute: 0.8 10*3/uL (ref 0.1–1.0)
Monocytes Relative: 12 %
NRBC: 0 % (ref 0.0–0.2)
Neutro Abs: 4.3 10*3/uL (ref 1.7–7.7)
Neutrophils Relative %: 64 %
PLATELETS: 216 10*3/uL (ref 150–400)
RBC: 3.22 MIL/uL — AB (ref 3.87–5.11)
RDW: 15.8 % — AB (ref 11.5–15.5)
WBC: 6.8 10*3/uL (ref 4.0–10.5)

## 2018-01-11 LAB — CMP (CANCER CENTER ONLY)
ALBUMIN: 3.2 g/dL — AB (ref 3.5–5.0)
ALT: 10 U/L (ref 0–44)
ANION GAP: 9 (ref 5–15)
AST: 18 U/L (ref 15–41)
Alkaline Phosphatase: 60 U/L (ref 38–126)
BUN: 7 mg/dL — ABNORMAL LOW (ref 8–23)
CO2: 32 mmol/L (ref 22–32)
CREATININE: 0.8 mg/dL (ref 0.44–1.00)
Calcium: 9.2 mg/dL (ref 8.9–10.3)
Chloride: 107 mmol/L (ref 98–111)
GFR, Estimated: >60 mL/min (ref >60)
Glucose, Bld: 100 mg/dL — ABNORMAL HIGH (ref 70–99)
Potassium: 3.7 mmol/L (ref 3.5–5.1)
SODIUM: 148 mmol/L — AB (ref 135–145)
TOTAL PROTEIN: 6.7 g/dL (ref 6.5–8.1)
Total Bilirubin: 0.5 mg/dL (ref 0.3–1.2)

## 2018-01-11 LAB — TSH: TSH: 3.91 u[IU]/mL (ref 0.308–3.960)

## 2018-01-11 MED ORDER — SODIUM CHLORIDE 0.9% FLUSH
10.0000 mL | INTRAVENOUS | Status: DC | PRN
  Administered 2018-01-11: 10 mL
  Filled 2018-01-11: qty 10

## 2018-01-11 MED ORDER — SODIUM CHLORIDE 0.9 % IV SOLN
200.0000 mg | Freq: Once | INTRAVENOUS | Status: AC
  Administered 2018-01-11: 200 mg via INTRAVENOUS
  Filled 2018-01-11: qty 8

## 2018-01-11 MED ORDER — SODIUM CHLORIDE 0.9 % IV SOLN
490.0000 mg/m2 | Freq: Once | INTRAVENOUS | Status: AC
  Administered 2018-01-11: 1000 mg via INTRAVENOUS
  Filled 2018-01-11: qty 40

## 2018-01-11 MED ORDER — SODIUM CHLORIDE 0.9 % IV SOLN
Freq: Once | INTRAVENOUS | Status: AC
  Administered 2018-01-11: 11:00:00 via INTRAVENOUS
  Filled 2018-01-11: qty 250

## 2018-01-11 MED ORDER — HEPARIN SOD (PORK) LOCK FLUSH 100 UNIT/ML IV SOLN
500.0000 [IU] | Freq: Once | INTRAVENOUS | Status: AC | PRN
  Administered 2018-01-11: 500 [IU]
  Filled 2018-01-11: qty 5

## 2018-01-11 MED ORDER — ONDANSETRON HCL 4 MG/2ML IJ SOLN
8.0000 mg | Freq: Once | INTRAMUSCULAR | Status: AC
  Administered 2018-01-11: 8 mg via INTRAVENOUS

## 2018-01-11 MED ORDER — DEXAMETHASONE SODIUM PHOSPHATE 10 MG/ML IJ SOLN
10.0000 mg | Freq: Once | INTRAMUSCULAR | Status: AC
  Administered 2018-01-11: 10 mg via INTRAVENOUS

## 2018-01-11 MED ORDER — ONDANSETRON HCL 4 MG/2ML IJ SOLN
INTRAMUSCULAR | Status: AC
  Filled 2018-01-11: qty 4

## 2018-01-11 MED ORDER — DEXAMETHASONE SODIUM PHOSPHATE 10 MG/ML IJ SOLN
INTRAMUSCULAR | Status: AC
  Filled 2018-01-11: qty 1

## 2018-01-11 NOTE — Progress Notes (Signed)
Nutrition follow-up completed with patient receiving chemotherapy for stage IV non-small cell lung cancer. Weight decreased and documented as 193 pounds October 31.  This is decreased from 195 pounds in August. Patient denies nutrition impact symptoms. She reports she tolerates oral nutrition supplements. She has no questions or concerns.  Nutrition diagnosis: Food and nutrition related knowledge deficit has improved.  Intervention: Educated patient to continue small amounts of high-calorie, high-protein food more often. Encourage patient to continue oral nutrition supplements 2-3 times daily.  I provided coupons. Questions were answered.  Teach back method used.  Contact information provided  Monitoring, evaluation, goals: Patient will tolerate increased calories and protein to minimize further weight loss.  Next visit: To be scheduled as needed.  **Disclaimer: This note was dictated with voice recognition software. Similar sounding words can inadvertently be transcribed and this note may contain transcription errors which may not have been corrected upon publication of note.**

## 2018-01-11 NOTE — Progress Notes (Signed)
[facility] Telephone:([phone]   [phone]  OFFICE PROGRESS NOTE  [name], MD [place] [address]  DIAGNOSIS:  1) Stage IV (T3, N0, M1 a)non-small cell lung cancer, well-differentiated adenocarcinoma presented with multifocal disease involving mainly the right upper lobe as well as the right middle lobe and left lower lobe of the lung diagnosed in May 2019. 2) bilateral small pulmonary emboli diagnosed incidentally on CT scan of the chest on October 16, 2017  Guardant 360 testing was negative for actionable mutations.  PRIOR THERAPY:None  CURRENT THERAPY: 1) Carboplatin for an AUC of 5, Alimta 500 mg/m2 and Keytruda 200 mg IV given every 3 weeks. First dosegiven on 08/18/2017.Status post 7 cycles.  Starting from cycle #5 the patient is on maintenance Alimta and Ketruda (pembrolizumab) every 3 weeks. 2) Xarelto 15 mg p.o. twice daily for the first 3 weeks followed by 20 mg p.o. Daily.  INTERVAL HISTORY: Erin Myers 77 y.o. female returns to the clinic today for follow-up visit.  The patient is feeling fine today with no concerning complaints except for the baseline shortness of breath increased with exertion and currently on home oxygen.  She also has mild fatigue.  She denied having any current chest pain, cough or hemoptysis.  She denied having any fever or chills.  She has no nausea, vomiting, diarrhea or constipation.  She continues to tolerate her treatment with Alimta and Ketruda (pembrolizumab) fairly well.  The patient is here today for evaluation before starting cycle #8 of her treatment.   MEDICAL HISTORY: Past Medical History:  Diagnosis Date  . Anxiety   . Diabetes mellitus without complication ([facility])   . Dyslipidemia   . HTN (hypertension)   . Hyperlipemia   . Hypothyroid   . Hypothyroidism   . Lung cancer ([facility]) 2000   Carcinoid, s/p RLL lobectomy  . Obesity   . Ovarian cancer ([facility])   .  Palpitations 2009 and 2006   Negative nuclear stress test   . Sleep apnea    on C-pap    ALLERGIES:  is allergic to adhesive [tape]; codeine; lactose intolerance (gi); monosodium glutamate; and sulfa antibiotics.  MEDICATIONS:  Current Outpatient Medications  Medication Sig Dispense Refill  . acetaminophen (TYLENOL) 500 MG tablet Take 500 mg by mouth every 6 (six) hours as needed.    . benzonatate (TESSALON) 200 MG capsule Take 1 capsule (200 mg total) by mouth 3 (three) times daily as needed for cough. 30 capsule 1  . budesonide-formoterol (SYMBICORT) 160-4.5 MCG/ACT inhaler Inhale 2 puffs into the lungs 2 (two) times daily. 1 Inhaler 6  . clorazepate (TRANXENE) 7.5 MG tablet Take 7.5 mg by mouth 2 (two) times daily as needed for anxiety.    . folic acid (FOLVITE) 1 MG tablet TAKE 1 TABLET BY MOUTH DAILY. 30 tablet 2  . guaiFENesin-codeine (CHERATUSSIN AC) 100-10 MG/5ML syrup Take 5 mLs by mouth every 6 (six) hours as needed for cough. 240 mL 0  . ipratropium-albuterol (DUONEB) 0.5-2.5 (3) MG/3ML SOLN INHALE THE CONTENTS OF 1 VIAL VIA NEBULIZER 3 TIMES A DAY. 360 mL 2  . levothyroxine (SYNTHROID, LEVOTHROID) 125 MCG tablet Take 125 mcg by mouth daily before breakfast.    . lidocaine-prilocaine (EMLA) cream Apply 1 application topically as needed. (Patient not taking: Reported on 12/19/2017) 30 g 0  . losartan-hydrochlorothiazide (HYZAAR) 50-12.5 MG per tablet Take 1 tablet by mouth every morning.     . metoprolol succinate (  TOPROL-XL) 50 MG 24 hr tablet Take 50 mg by mouth 2 (two) times daily.     . potassium chloride SA (K-DUR,KLOR-CON) 20 MEQ tablet Take 1 tablet (20 mEq total) by mouth daily. 7 tablet 0  . prochlorperazine (COMPAZINE) 10 MG tablet Take 1 tablet (10 mg total) by mouth every 6 (six) hours as needed for nausea or vomiting. (Patient not taking: Reported on 12/19/2017) 30 tablet 0  . rosuvastatin (CRESTOR) 10 MG tablet Take 10 mg by mouth every evening.     . valACYclovir  (VALTREX) 500 MG tablet Take 500 mg by mouth daily.    [name] 20 MG TABS tablet TAKE 1 TABLET (20 MG TOTAL) BY MOUTH DAILY WITH SUPPER. 30 tablet 1   No current facility-administered medications for this visit.     SURGICAL HISTORY:  Past Surgical History:  Procedure Laterality Date  . ABDOMINAL HYSTERECTOMY  2001  . GRADED EXERCISE TOLERANCE TEST  08/2015    Blood pressure demonstrated a hypertensive response to exercise.  There was less than 0.32mm of horizontal ST segment depression in the inferolateral leads. No diagnostic criterial for ischemia. Moderately impaired exercise tolerance. The patient achieved 5.8 mets. This is a low risk study.  . IR FLUORO GUIDE PORT INSERTION RIGHT  08/17/2017  . IR US GUIDE VASC ACCESS RIGHT  08/17/2017  . LOBECTOMY  2000   RLL  . NM MYOCAR PERF WALL MOTION  08/20/2007   EF 74%  EXERCISE CAPACITY 7 METS  . OOPHORECTOMY  2001  . TONSILLECTOMY    . VIDEO BRONCHOSCOPY Bilateral 07/20/2017   Procedure: VIDEO BRONCHOSCOPY WITH FLUORO;  Surgeon: Collene Gobble, MD;  Location: [facility];  Service: Cardiopulmonary;  Laterality: Bilateral;    REVIEW OF SYSTEMS:  A comprehensive review of systems was negative except for: Constitutional: positive for fatigue Respiratory: positive for dyspnea on exertion   PHYSICAL EXAMINATION: General appearance: alert, cooperative, fatigued and no distress Head: Normocephalic, without obvious abnormality, atraumatic Neck: no adenopathy, no JVD, supple, symmetrical, trachea midline and thyroid not enlarged, symmetric, no tenderness/mass/nodules Lymph nodes: Cervical, supraclavicular, and axillary nodes normal. Resp: rales bilaterally Back: symmetric, no curvature. ROM normal. No CVA tenderness. Cardio: regular rate and rhythm, S1, S2 normal, no murmur, click, rub or gallop GI: soft, non-tender; bowel sounds normal; no masses,  no organomegaly Extremities: extremities normal, atraumatic, no cyanosis or edema  ECOG  PERFORMANCE STATUS: 1 - Symptomatic but completely ambulatory  Blood pressure (!) 125/39, pulse 91, temperature 98 F (36.7 C), temperature source Oral, resp. rate (!) 24, height 5' 4.25" (1.632 m), weight 193 lb (87.5 kg), SpO2 92 %.  LABORATORY DATA: Lab Results  Component Value Date   WBC 6.8 01/11/2018   HGB 11.0 (L) 01/11/2018   HCT 34.8 (L) 01/11/2018   MCV 108.1 (H) 01/11/2018   PLT 216 01/11/2018      Chemistry      Component Value Date/Time   NA 148 (H) 01/11/2018 0923   K 3.7 01/11/2018 0923   CL 107 01/11/2018 0923   CO2 32 01/11/2018 0923   BUN 7 (L) 01/11/2018 0923   CREATININE 0.80 01/11/2018 0923      Component Value Date/Time   CALCIUM 9.2 01/11/2018 0923   ALKPHOS 60 01/11/2018 0923   AST 18 01/11/2018 0923   ALT 10 01/11/2018 0923   BILITOT 0.5 01/11/2018 0923       RADIOGRAPHIC STUDIES: Ct Chest W Contrast  Result Date: 12/14/2017 CLINICAL DATA:  Stage IV lung  cancer per. RIGHT lung cancer. Chemotherapy ongoing EXAM: CT CHEST, ABDOMEN, AND PELVIS WITH CONTRAST TECHNIQUE: Multidetector CT imaging of the chest, abdomen and pelvis was performed following the standard protocol during bolus administration of intravenous contrast. CONTRAST:  124mL OMNIPAQUE IOHEXOL 300 MG/ML  SOLN COMPARISON:  CT 10/16/2017 FINDINGS: CT CHEST FINDINGS Cardiovascular: Port in the anterior chest wall with tip in distal SVC. Pulmonary emboli no longer identified. Mediastinum/Nodes: No axillary supraclavicular adenopathy. Subcarinal lymph node measures 12 mm (image 30/2) compared with 13 mm on prior. Small RIGHT hilar node and RIGHT paratracheal lymph nodes are unchanged. Lungs/Pleura: Consolidated pattern in the posterior aspect of the RIGHT upper lobe is decreased in volume. Lesion difficult to measure - approximately 2.5 by 3.7 cm compared to 4.2 x 4.9 cm. There is ground-glass opacities and bronchiectasis surrounding this consolidative pattern. In the lower lobes reticulonodular  pattern is improved. No new measurable nodularity in LEFT olr  RIGHT lung. Musculoskeletal: No aggressive osseous lesion. CT ABDOMEN AND PELVIS FINDINGS Hepatobiliary: Mild nodularity to the liver. No focal hepatic lesion. Gallbladder normal Pancreas: No pancreatic lesion. Spleen: Normal spleen Adrenals/urinary tract: Adrenal glands normal. Kidneys, ureters bladder normal. Stomach/Bowel: Stomach, small bowel, appendix, and cecum are normal. The colon and rectosigmoid colon are normal. Vascular/Lymphatic: Abdominal aorta is normal caliber with atherosclerotic calcification. There is no retroperitoneal or periportal lymphadenopathy. No pelvic lymphadenopathy. Reproductive: Post hysterectomy Other: No peritoneal metastasis Musculoskeletal: No aggressive osseous lesion. IMPRESSION: Chest Impression: 1. Interval decrease in volume of peripheral consolidation in the RIGHT upper lobe is most suggestive of improved post radiation change or improved pneumonia. 2. Interval decrease in the nodularity within the bibasilar reticulonodular pattern suggest improved infectious or inflammatory process. 3. No new or measurable nodularity. 4. Stable subcarinal adenopathy. Abdomen / Pelvis Impression: 1. No evidence of metastatic disease in the abdomen pelvis. Electronically Signed   By: Suzy Bouchard M.D.   On: 12/14/2017 17:25   Ct Abdomen Pelvis W Contrast  Result Date: 12/14/2017 CLINICAL DATA:  Stage IV lung cancer per. RIGHT lung cancer. Chemotherapy ongoing EXAM: CT CHEST, ABDOMEN, AND PELVIS WITH CONTRAST TECHNIQUE: Multidetector CT imaging of the chest, abdomen and pelvis was performed following the standard protocol during bolus administration of intravenous contrast. CONTRAST:  148mL OMNIPAQUE IOHEXOL 300 MG/ML  SOLN COMPARISON:  CT 10/16/2017 FINDINGS: CT CHEST FINDINGS Cardiovascular: Port in the anterior chest wall with tip in distal SVC. Pulmonary emboli no longer identified. Mediastinum/Nodes: No axillary  supraclavicular adenopathy. Subcarinal lymph node measures 12 mm (image 30/2) compared with 13 mm on prior. Small RIGHT hilar node and RIGHT paratracheal lymph nodes are unchanged. Lungs/Pleura: Consolidated pattern in the posterior aspect of the RIGHT upper lobe is decreased in volume. Lesion difficult to measure - approximately 2.5 by 3.7 cm compared to 4.2 x 4.9 cm. There is ground-glass opacities and bronchiectasis surrounding this consolidative pattern. In the lower lobes reticulonodular pattern is improved. No new measurable nodularity in LEFT olr  RIGHT lung. Musculoskeletal: No aggressive osseous lesion. CT ABDOMEN AND PELVIS FINDINGS Hepatobiliary: Mild nodularity to the liver. No focal hepatic lesion. Gallbladder normal Pancreas: No pancreatic lesion. Spleen: Normal spleen Adrenals/urinary tract: Adrenal glands normal. Kidneys, ureters bladder normal. Stomach/Bowel: Stomach, small bowel, appendix, and cecum are normal. The colon and rectosigmoid colon are normal. Vascular/Lymphatic: Abdominal aorta is normal caliber with atherosclerotic calcification. There is no retroperitoneal or periportal lymphadenopathy. No pelvic lymphadenopathy. Reproductive: Post hysterectomy Other: No peritoneal metastasis Musculoskeletal: No aggressive osseous lesion. IMPRESSION: Chest Impression: 1. Interval decrease in  volume of peripheral consolidation in the RIGHT upper lobe is most suggestive of improved post radiation change or improved pneumonia. 2. Interval decrease in the nodularity within the bibasilar reticulonodular pattern suggest improved infectious or inflammatory process. 3. No new or measurable nodularity. 4. Stable subcarinal adenopathy. Abdomen / Pelvis Impression: 1. No evidence of metastatic disease in the abdomen pelvis. Electronically Signed   By: Suzy Bouchard M.D.   On: 12/14/2017 17:25    ASSESSMENT AND PLAN: This is a very pleasant 77 years old white female with stage IV non-small cell lung  cancer, adenocarcinoma with no actionable mutations. She is currently undergoing systemic chemotherapy with carboplatin, Alimta and Ketruda (pembrolizumab) status post 7 cycles.  Starting from cycle #5 she is on maintenance treatment with Alimta and Ketruda (pembrolizumab). The patient continues to tolerate this treatment well with no concerning adverse effects. I recommended for her to proceed with cycle #8 today as a schedule. I will see her back for follow-up visit in 3 weeks for evaluation before starting cycle #9. For the recently diagnosed the small bilateral pulmonary emboli, she will continue her current treatment with Xarelto 20 mg p.o. daily. The patient was advised to call immediately if she has any concerning symptoms in the interval. The patient voices understanding of current disease status and treatment options and is in agreement with the current care plan. All questions were answered. The patient knows to call the clinic with any problems, questions or concerns. We can certainly see the patient much sooner if necessary.  Disclaimer: This note was dictated with voice recognition software. Similar sounding words can inadvertently be transcribed and may not be corrected upon review.

## 2018-01-11 NOTE — Patient Instructions (Signed)
[facility] Discharge Instructions for Patients Receiving Chemotherapy  Today you received the following chemotherapy agents: alimta and keytruda.  To help prevent nausea and vomiting after your treatment, we encourage you to take your nausea medication as directed.   If you develop nausea and vomiting that is not controlled by your nausea medication, call the clinic.   BELOW ARE SYMPTOMS THAT SHOULD BE REPORTED IMMEDIATELY:  *FEVER GREATER THAN 100.5 F  *CHILLS WITH OR WITHOUT FEVER  NAUSEA AND VOMITING THAT IS NOT CONTROLLED WITH YOUR NAUSEA MEDICATION  *UNUSUAL SHORTNESS OF BREATH  *UNUSUAL BRUISING OR BLEEDING  TENDERNESS IN MOUTH AND THROAT WITH OR WITHOUT PRESENCE OF ULCERS  *URINARY PROBLEMS  *BOWEL PROBLEMS  UNUSUAL RASH Items with * indicate a potential emergency and should be followed up as soon as possible.  Feel free to call the clinic should you have any questions or concerns. The clinic phone number is [phone].  Please show the [facility] at check-in to the Emergency Department and triage nurse.

## 2018-01-16 DIAGNOSIS — G4733 Obstructive sleep apnea (adult) (pediatric): Secondary | ICD-10-CM | POA: Diagnosis not present

## 2018-01-19 ENCOUNTER — Other Ambulatory Visit: Payer: Self-pay | Admitting: Pulmonary Disease

## 2018-01-19 DIAGNOSIS — R059 Cough, unspecified: Secondary | ICD-10-CM

## 2018-01-19 DIAGNOSIS — R05 Cough: Secondary | ICD-10-CM

## 2018-01-23 ENCOUNTER — Other Ambulatory Visit: Payer: Self-pay | Admitting: Pulmonary Disease

## 2018-01-23 ENCOUNTER — Telehealth: Payer: Self-pay | Admitting: Pulmonary Disease

## 2018-01-23 DIAGNOSIS — R05 Cough: Secondary | ICD-10-CM

## 2018-01-23 DIAGNOSIS — R059 Cough, unspecified: Secondary | ICD-10-CM

## 2018-01-23 MED ORDER — GUAIFENESIN-CODEINE 100-10 MG/5ML PO SYRP: 5.0000 mL | ORAL_SOLUTION | Freq: Four times a day (QID) | ORAL | 0 refills | Status: DC | PRN

## 2018-01-23 NOTE — Telephone Encounter (Signed)
Called and spoke patient, requesting refill on cough Medicine as well as recommendations for an anti-histamine for post nasal drip.   Medication Guaifenesin-Codeine Syrup 38ml Q6H PRN #220ml Last filled 11/10/2017  LOV 11/10/2017  SN please advise if willing to refill and recommendations for anti-histamine.

## 2018-01-23 NOTE — Telephone Encounter (Signed)
Per SN- ok to refill, and Allegra 180mg , take 1 every morning as needed.  Called and spoke with Patient.  Recommendations given. Prescription printed and signed by Dr. Lenna Gilford, and placed up front for pick up.

## 2018-01-25 DIAGNOSIS — Z13820 Encounter for screening for osteoporosis: Secondary | ICD-10-CM | POA: Diagnosis not present

## 2018-01-25 DIAGNOSIS — Z23 Encounter for immunization: Secondary | ICD-10-CM | POA: Diagnosis not present

## 2018-02-01 ENCOUNTER — Inpatient Hospital Stay (HOSPITAL_BASED_OUTPATIENT_CLINIC_OR_DEPARTMENT_OTHER): Payer: PPO | Admitting: Oncology

## 2018-02-01 ENCOUNTER — Telehealth: Payer: Self-pay

## 2018-02-01 ENCOUNTER — Inpatient Hospital Stay: Payer: PPO | Attending: Internal Medicine

## 2018-02-01 ENCOUNTER — Inpatient Hospital Stay: Payer: PPO

## 2018-02-01 ENCOUNTER — Encounter: Payer: Self-pay | Admitting: Oncology

## 2018-02-01 VITALS — BP 138/75 | HR 92 | Temp 97.9°F | Resp 20 | Ht 64.0 in | Wt 190.4 lb

## 2018-02-01 DIAGNOSIS — I10 Essential (primary) hypertension: Secondary | ICD-10-CM

## 2018-02-01 DIAGNOSIS — J90 Pleural effusion, not elsewhere classified: Secondary | ICD-10-CM | POA: Diagnosis not present

## 2018-02-01 DIAGNOSIS — G4730 Sleep apnea, unspecified: Secondary | ICD-10-CM | POA: Insufficient documentation

## 2018-02-01 DIAGNOSIS — E785 Hyperlipidemia, unspecified: Secondary | ICD-10-CM | POA: Insufficient documentation

## 2018-02-01 DIAGNOSIS — Z7901 Long term (current) use of anticoagulants: Secondary | ICD-10-CM | POA: Insufficient documentation

## 2018-02-01 DIAGNOSIS — R110 Nausea: Secondary | ICD-10-CM | POA: Insufficient documentation

## 2018-02-01 DIAGNOSIS — C3481 Malignant neoplasm of overlapping sites of right bronchus and lung: Secondary | ICD-10-CM | POA: Diagnosis not present

## 2018-02-01 DIAGNOSIS — Z5112 Encounter for antineoplastic immunotherapy: Secondary | ICD-10-CM | POA: Insufficient documentation

## 2018-02-01 DIAGNOSIS — F419 Anxiety disorder, unspecified: Secondary | ICD-10-CM | POA: Diagnosis not present

## 2018-02-01 DIAGNOSIS — Z5111 Encounter for antineoplastic chemotherapy: Secondary | ICD-10-CM | POA: Diagnosis not present

## 2018-02-01 DIAGNOSIS — E039 Hypothyroidism, unspecified: Secondary | ICD-10-CM | POA: Diagnosis not present

## 2018-02-01 DIAGNOSIS — E119 Type 2 diabetes mellitus without complications: Secondary | ICD-10-CM | POA: Insufficient documentation

## 2018-02-01 DIAGNOSIS — C3491 Malignant neoplasm of unspecified part of right bronchus or lung: Secondary | ICD-10-CM

## 2018-02-01 DIAGNOSIS — Z8543 Personal history of malignant neoplasm of ovary: Secondary | ICD-10-CM | POA: Diagnosis not present

## 2018-02-01 DIAGNOSIS — Z95828 Presence of other vascular implants and grafts: Secondary | ICD-10-CM

## 2018-02-01 DIAGNOSIS — Z79899 Other long term (current) drug therapy: Secondary | ICD-10-CM

## 2018-02-01 DIAGNOSIS — C7802 Secondary malignant neoplasm of left lung: Secondary | ICD-10-CM | POA: Insufficient documentation

## 2018-02-01 DIAGNOSIS — R5383 Other fatigue: Secondary | ICD-10-CM | POA: Diagnosis not present

## 2018-02-01 DIAGNOSIS — E669 Obesity, unspecified: Secondary | ICD-10-CM | POA: Diagnosis not present

## 2018-02-01 LAB — CBC WITH DIFFERENTIAL (CANCER CENTER ONLY)
Abs Immature Granulocytes: 0.03 10*3/uL (ref 0.00–0.07)
BASOS ABS: 0 10*3/uL (ref 0.0–0.1)
Basophils Relative: 1 %
EOS ABS: 0.1 10*3/uL (ref 0.0–0.5)
EOS PCT: 2 %
HEMATOCRIT: 32.6 % — AB (ref 36.0–46.0)
Hemoglobin: 10.3 g/dL — ABNORMAL LOW (ref 12.0–15.0)
Immature Granulocytes: 0 %
LYMPHS ABS: 1.2 10*3/uL (ref 0.7–4.0)
Lymphocytes Relative: 16 %
MCH: 34.4 pg — ABNORMAL HIGH (ref 26.0–34.0)
MCHC: 31.6 g/dL (ref 30.0–36.0)
MCV: 109 fL — ABNORMAL HIGH (ref 80.0–100.0)
Monocytes Absolute: 0.9 10*3/uL (ref 0.1–1.0)
Monocytes Relative: 12 %
NRBC: 0 % (ref 0.0–0.2)
Neutro Abs: 5.1 10*3/uL (ref 1.7–7.7)
Neutrophils Relative %: 69 %
Platelet Count: 265 10*3/uL (ref 150–400)
RBC: 2.99 MIL/uL — ABNORMAL LOW (ref 3.87–5.11)
RDW: 14.6 % (ref 11.5–15.5)
WBC: 7.4 10*3/uL (ref 4.0–10.5)

## 2018-02-01 LAB — CMP (CANCER CENTER ONLY)
ALK PHOS: 63 U/L (ref 38–126)
ALT: 6 U/L (ref 0–44)
AST: 17 U/L (ref 15–41)
Albumin: 3.1 g/dL — ABNORMAL LOW (ref 3.5–5.0)
Anion gap: 7 (ref 5–15)
BUN: 7 mg/dL — ABNORMAL LOW (ref 8–23)
CALCIUM: 9.1 mg/dL (ref 8.9–10.3)
CO2: 32 mmol/L (ref 22–32)
CREATININE: 0.81 mg/dL (ref 0.44–1.00)
Chloride: 104 mmol/L (ref 98–111)
GFR, Estimated: >60 mL/min (ref >60)
Glucose, Bld: 111 mg/dL — ABNORMAL HIGH (ref 70–99)
Potassium: 3.7 mmol/L (ref 3.5–5.1)
Sodium: 143 mmol/L (ref 135–145)
TOTAL PROTEIN: 6.9 g/dL (ref 6.5–8.1)
Total Bilirubin: 0.4 mg/dL (ref 0.3–1.2)

## 2018-02-01 LAB — TSH: TSH: 6.828 u[IU]/mL — ABNORMAL HIGH (ref 0.308–3.960)

## 2018-02-01 MED ORDER — SODIUM CHLORIDE 0.9% FLUSH
10.0000 mL | INTRAVENOUS | Status: DC | PRN
  Filled 2018-02-01: qty 10

## 2018-02-01 MED ORDER — SODIUM CHLORIDE 0.9 % IV SOLN
1000.0000 mg | Freq: Once | INTRAVENOUS | Status: AC
  Administered 2018-02-01: 1000 mg via INTRAVENOUS
  Filled 2018-02-01: qty 40

## 2018-02-01 MED ORDER — ONDANSETRON HCL 4 MG/2ML IJ SOLN
INTRAMUSCULAR | Status: AC
  Filled 2018-02-01: qty 4

## 2018-02-01 MED ORDER — SODIUM CHLORIDE 0.9 % IV SOLN
Freq: Once | INTRAVENOUS | Status: DC
  Filled 2018-02-01: qty 250

## 2018-02-01 MED ORDER — SODIUM CHLORIDE 0.9 % IV SOLN
Freq: Once | INTRAVENOUS | Status: AC
  Administered 2018-02-01: 11:00:00 via INTRAVENOUS
  Filled 2018-02-01: qty 250

## 2018-02-01 MED ORDER — CYANOCOBALAMIN 1000 MCG/ML IJ SOLN
INTRAMUSCULAR | Status: AC
  Filled 2018-02-01: qty 1

## 2018-02-01 MED ORDER — DEXAMETHASONE SODIUM PHOSPHATE 10 MG/ML IJ SOLN
INTRAMUSCULAR | Status: AC
  Filled 2018-02-01: qty 1

## 2018-02-01 MED ORDER — SODIUM CHLORIDE 0.9% FLUSH
10.0000 mL | INTRAVENOUS | Status: DC | PRN
  Administered 2018-02-01: 10 mL
  Filled 2018-02-01: qty 10

## 2018-02-01 MED ORDER — ONDANSETRON HCL 4 MG/2ML IJ SOLN
8.0000 mg | Freq: Once | INTRAMUSCULAR | Status: AC
  Administered 2018-02-01: 8 mg via INTRAVENOUS

## 2018-02-01 MED ORDER — SODIUM CHLORIDE 0.9 % IV SOLN
200.0000 mg | Freq: Once | INTRAVENOUS | Status: AC
  Administered 2018-02-01: 200 mg via INTRAVENOUS
  Filled 2018-02-01: qty 8

## 2018-02-01 MED ORDER — DEXAMETHASONE SODIUM PHOSPHATE 10 MG/ML IJ SOLN
10.0000 mg | Freq: Once | INTRAMUSCULAR | Status: AC
  Administered 2018-02-01: 10 mg via INTRAVENOUS

## 2018-02-01 MED ORDER — CYANOCOBALAMIN 1000 MCG/ML IJ SOLN
1000.0000 ug | Freq: Once | INTRAMUSCULAR | Status: AC
  Administered 2018-02-01: 1000 ug via INTRAMUSCULAR

## 2018-02-01 MED ORDER — HEPARIN SOD (PORK) LOCK FLUSH 100 UNIT/ML IV SOLN
500.0000 [IU] | Freq: Once | INTRAVENOUS | Status: AC | PRN
  Administered 2018-02-01: 500 [IU]
  Filled 2018-02-01: qty 5

## 2018-02-01 NOTE — Assessment & Plan Note (Addendum)
This is a very pleasant 77 year old white female with stage IV non-small cell lung cancer, adenocarcinoma with no actionable mutations. She is currently undergoing systemic chemotherapy with carboplatin, Alimta and Ketruda (pembrolizumab) status post 8 cycles.  Starting from cycle #5 she is on maintenance treatment with Alimta and Keytruda (pembrolizumab). The patient continues to tolerate this treatment well with the exception of fatigue and nausea which is controlled with antiemetics. Recommend for the patient to proceed with cycle #9 of her treatment today as scheduled.  She will have a restaging CT scan of the chest, abdomen, pelvis prior to her next visit.  She will follow-up in 3 weeks for evaluation prior to cycle #10 and to review her restaging CT scan results.  For the small bilateral pulmonary emboli, she will continue her current treatment with Xarelto 20 mg p.o. Daily.  The patient was advised to call immediately if she has any concerning symptoms in the interval. The patient voices understanding of current disease status and treatment options and is in agreement with the current care plan. All questions were answered. The patient knows to call the clinic with any problems, questions or concerns. We can certainly see the patient much sooner if necessary.

## 2018-02-01 NOTE — Progress Notes (Signed)
Flush done under [facility] visit encounter

## 2018-02-01 NOTE — Patient Instructions (Signed)
[facility] Discharge Instructions for Patients Receiving Chemotherapy  Today you received the following chemotherapy agents:  keytruda & alimta  To help prevent nausea and vomiting after your treatment, we encourage you to take your nausea medication as prescribed.    If you develop nausea and vomiting that is not controlled by your nausea medication, call the clinic.   BELOW ARE SYMPTOMS THAT SHOULD BE REPORTED IMMEDIATELY:  *FEVER GREATER THAN 100.5 F  *CHILLS WITH OR WITHOUT FEVER  NAUSEA AND VOMITING THAT IS NOT CONTROLLED WITH YOUR NAUSEA MEDICATION  *UNUSUAL SHORTNESS OF BREATH  *UNUSUAL BRUISING OR BLEEDING  TENDERNESS IN MOUTH AND THROAT WITH OR WITHOUT PRESENCE OF ULCERS  *URINARY PROBLEMS  *BOWEL PROBLEMS  UNUSUAL RASH Items with * indicate a potential emergency and should be followed up as soon as possible.  Feel free to call the clinic should you have any questions or concerns. The clinic phone number is [phone].  Please show the [facility] at check-in to the Emergency Department and triage nurse.

## 2018-02-01 NOTE — Progress Notes (Signed)
[facility] OFFICE PROGRESS NOTE  [name], MD [place] [address]  DIAGNOSIS:  1) Stage IV (T3, N0, M1 a)non-small cell lung cancer, well-differentiated adenocarcinoma presented with multifocal disease involving mainly the right upper lobe as well as the right middle lobe and left lower lobe of the lung diagnosed in May 2019. 2) bilateral small pulmonary emboli diagnosed incidentally on CT scan of the chest on October 16, 2017  Guardant 360 testing was negative for actionable mutations.  PRIOR THERAPY:None  CURRENT THERAPY: 1) Carboplatin for an AUC of 5, Alimta 500 mg/m2 and Keytruda 200 mg IV given every 3 weeks. First dosegiven on 08/18/2017.Status post 8cycles.  Starting from cycle #5 the patient is on maintenance Alimta and Ketruda (pembrolizumab) every 3 weeks. 2) Xarelto 15 mg p.o. twice daily for the first 3 weeks followed by 20 mg p.o. Daily.  INTERVAL HISTORY: Erin Myers 77 y.o. female returns for routine follow-up visit accompanied by her husband.  The patient is feeling tired overall and has noticed more nausea since her last visit.  She is not vomiting.  The patient denies fevers and chills.  Denies chest pain and hemoptysis.  She has her baseline shortness of breath and wears home oxygen and she also has her baseline nonproductive cough.  Denies constipation and diarrhea.  Reports a fair appetite but she has lost weight since her last visit.  MEDICAL HISTORY: Past Medical History:  Diagnosis Date  . Anxiety   . Diabetes mellitus without complication ([facility])   . Dyslipidemia   . HTN (hypertension)   . Hyperlipemia   . Hypothyroid   . Hypothyroidism   . Lung cancer ([facility]) 2000   Carcinoid, s/p RLL lobectomy  . Obesity   . Ovarian cancer ([facility])   . Palpitations 2009 and 2006   Negative nuclear stress test   . Sleep apnea    on C-pap    ALLERGIES:  is allergic to adhesive [tape]; codeine; lactose intolerance  (gi); monosodium glutamate; and sulfa antibiotics.  MEDICATIONS:  Current Outpatient Medications  Medication Sig Dispense Refill  . acetaminophen (TYLENOL) 500 MG tablet Take 500 mg by mouth every 6 (six) hours as needed.    . benzonatate (TESSALON) 200 MG capsule Take 1 capsule (200 mg total) by mouth 3 (three) times daily as needed for cough. 30 capsule 1  . budesonide-formoterol (SYMBICORT) 160-4.5 MCG/ACT inhaler Inhale 2 puffs into the lungs 2 (two) times daily. 1 Inhaler 6  . clorazepate (TRANXENE) 7.5 MG tablet Take 7.5 mg by mouth 2 (two) times daily as needed for anxiety.    . folic acid (FOLVITE) 1 MG tablet TAKE 1 TABLET BY MOUTH DAILY. 30 tablet 2  . guaiFENesin-codeine (CHERATUSSIN AC) 100-10 MG/5ML syrup Take 5 mLs by mouth every 6 (six) hours as needed for cough. 240 mL 0  . ipratropium-albuterol (DUONEB) 0.5-2.5 (3) MG/3ML SOLN INHALE THE CONTENTS OF 1 VIAL VIA NEBULIZER 3 TIMES A DAY. 360 mL 2  . levothyroxine (SYNTHROID, LEVOTHROID) 125 MCG tablet Take 125 mcg by mouth daily before breakfast.    . lidocaine-prilocaine (EMLA) cream Apply 1 application topically as needed. (Patient not taking: Reported on 12/19/2017) 30 g 0  . losartan-hydrochlorothiazide (HYZAAR) 50-12.5 MG per tablet Take 1 tablet by mouth every morning.     . metoprolol succinate (TOPROL-XL) 50 MG 24 hr tablet Take 50 mg by mouth 2 (two) times daily.     . Multiple Vitamins-Minerals (VISION FORMULA 2 PO) Take by  mouth. OTC "Vision pills for Macular degeneration"    . potassium chloride SA (K-DUR,KLOR-CON) 20 MEQ tablet Take 1 tablet (20 mEq total) by mouth daily. 7 tablet 0  . prochlorperazine (COMPAZINE) 10 MG tablet Take 1 tablet (10 mg total) by mouth every 6 (six) hours as needed for nausea or vomiting. (Patient not taking: Reported on 12/19/2017) 30 tablet 0  . rosuvastatin (CRESTOR) 10 MG tablet Take 10 mg by mouth every evening.     . valACYclovir (VALTREX) 500 MG tablet Take 500 mg by mouth daily.    [name] 20 MG TABS tablet TAKE 1 TABLET (20 MG TOTAL) BY MOUTH DAILY WITH SUPPER. 30 tablet 1   No current facility-administered medications for this visit.     SURGICAL HISTORY:  Past Surgical History:  Procedure Laterality Date  . ABDOMINAL HYSTERECTOMY  2001  . GRADED EXERCISE TOLERANCE TEST  08/2015    Blood pressure demonstrated a hypertensive response to exercise.  There was less than 0.45mm of horizontal ST segment depression in the inferolateral leads. No diagnostic criterial for ischemia. Moderately impaired exercise tolerance. The patient achieved 5.8 mets. This is a low risk study.  . IR FLUORO GUIDE PORT INSERTION RIGHT  08/17/2017  . IR US GUIDE VASC ACCESS RIGHT  08/17/2017  . LOBECTOMY  2000   RLL  . NM MYOCAR PERF WALL MOTION  08/20/2007   EF 74%  EXERCISE CAPACITY 7 METS  . OOPHORECTOMY  2001  . TONSILLECTOMY    . VIDEO BRONCHOSCOPY Bilateral 07/20/2017   Procedure: VIDEO BRONCHOSCOPY WITH FLUORO;  Surgeon: Collene Gobble, MD;  Location: [facility];  Service: Cardiopulmonary;  Laterality: Bilateral;    REVIEW OF SYSTEMS:   Review of Systems  Constitutional: Negative for chills, fever.  Positive for fatigue, decreased appetite, and weight loss. HENT:   Negative for mouth sores, nosebleeds, sore throat and trouble swallowing.   Eyes: Negative for eye problems and icterus.  Respiratory: Negative for hemoptysis and wheezing.  Positive for cough and her baseline shortness of breath. Cardiovascular: Negative for chest pain and leg swelling.  Gastrointestinal: Negative for abdominal pain, constipation, diarrhea, and vomiting.  Positive for nausea. Genitourinary: Negative for bladder incontinence, difficulty urinating, dysuria, frequency and hematuria.   Musculoskeletal: Negative for back pain, gait problem, neck pain and neck stiffness.  Skin: Negative for itching and rash.  Neurological: Negative for dizziness, extremity weakness, gait problem, headaches, light-headedness and  seizures.  Hematological: Negative for adenopathy. Does not bruise/bleed easily.  Psychiatric/Behavioral: Negative for confusion, depression and sleep disturbance. The patient is not nervous/anxious.     PHYSICAL EXAMINATION:  Blood pressure 138/75, pulse 92, temperature 97.9 F (36.6 C), temperature source Oral, resp. rate 20, height 5\' 4"  (1.626 m), weight 190 lb 6.4 oz (86.4 kg), SpO2 90 %.  ECOG PERFORMANCE STATUS: 1 - Symptomatic but completely ambulatory  Physical Exam  Constitutional: Oriented to person, place, and time and well-developed, well-nourished, and in no distress. No distress.  HENT:  Head: Normocephalic and atraumatic.  Mouth/Throat: Oropharynx is clear and moist. No oropharyngeal exudate.  Eyes: Conjunctivae are normal. Right eye exhibits no discharge. Left eye exhibits no discharge. No scleral icterus.  Neck: Normal range of motion. Neck supple.  Cardiovascular: Normal rate, regular rhythm, normal heart sounds and intact distal pulses.   Pulmonary/Chest: Diminished breath sounds with scattered expiratory wheezes. Abdominal: Soft. Bowel sounds are normal. Exhibits no distension and no mass. There is no tenderness.  Musculoskeletal: Normal range of motion. Exhibits no  edema.  Lymphadenopathy:    No cervical adenopathy.  Neurological: Alert and oriented to person, place, and time. Exhibits normal muscle tone. Gait normal. Coordination normal.  Skin: Skin is warm and dry. No rash noted. Not diaphoretic. No erythema. No pallor.  Psychiatric: Mood, memory and judgment normal.  Vitals reviewed.  LABORATORY DATA: Lab Results  Component Value Date   WBC 7.4 02/01/2018   HGB 10.3 (L) 02/01/2018   HCT 32.6 (L) 02/01/2018   MCV 109.0 (H) 02/01/2018   PLT 265 02/01/2018      Chemistry      Component Value Date/Time   NA 143 02/01/2018 0924   K 3.7 02/01/2018 0924   CL 104 02/01/2018 0924   CO2 32 02/01/2018 0924   BUN 7 (L) 02/01/2018 0924   CREATININE 0.81  02/01/2018 0924      Component Value Date/Time   CALCIUM 9.1 02/01/2018 0924   ALKPHOS 63 02/01/2018 0924   AST 17 02/01/2018 0924   ALT 6 02/01/2018 0924   BILITOT 0.4 02/01/2018 0924       RADIOGRAPHIC STUDIES:  No results found.   ASSESSMENT/PLAN:  Adenocarcinoma of right lung, stage 4 (HCC) This is a very pleasant 77 year old white female with stage IV non-small cell lung cancer, adenocarcinoma with no actionable mutations. She is currently undergoing systemic chemotherapy with carboplatin, Alimta and Ketruda (pembrolizumab) status post 8 cycles.  Starting from cycle #5 she is on maintenance treatment with Alimta and Keytruda (pembrolizumab). The patient continues to tolerate this treatment well with the exception of fatigue and nausea which is controlled with antiemetics. Recommend for the patient to proceed with cycle #9 of her treatment today as scheduled.  She will have a restaging CT scan of the chest, abdomen, pelvis prior to her next visit.  She will follow-up in 3 weeks for evaluation prior to cycle #10 and to review her restaging CT scan results.  For the small bilateral pulmonary emboli, she will continue her current treatment with Xarelto 20 mg p.o. Daily.  The patient was advised to call immediately if she has any concerning symptoms in the interval. The patient voices understanding of current disease status and treatment options and is in agreement with the current care plan. All questions were answered. The patient knows to call the clinic with any problems, questions or concerns. We can certainly see the patient much sooner if necessary.   Orders Placed This Encounter  Procedures  . CT ABDOMEN PELVIS W CONTRAST    Standing Status:   Future    Standing Expiration Date:   02/02/2019    Order Specific Question:   If indicated for the ordered procedure, I authorize the administration of contrast media per Radiology protocol    Answer:   Yes    Order Specific  Question:   Preferred imaging location?    Answer:   [facility]    Order Specific Question:   Radiology Contrast Protocol - do NOT remove file path    Answer:   [email or web address]    Order Specific Question:   ** REASON FOR EXAM (FREE TEXT)    Answer:   Lung cancer. Restaging.  . CT CHEST W CONTRAST    Standing Status:   Future    Standing Expiration Date:   02/02/2019    Order Specific Question:   If indicated for the ordered procedure, I authorize the administration of contrast media per Radiology protocol    Answer:   Yes  Order Specific Question:   Preferred imaging location?    Answer:   [facility]    Order Specific Question:   Radiology Contrast Protocol - do NOT remove file path    Answer:   [email or web address]    Order Specific Question:   ** REASON FOR EXAM (FREE TEXT)    Answer:   Lung cancer. Restaging.     Mikey Bussing, DNP, AGPCNP-BC, AOCNP 02/01/18

## 2018-02-01 NOTE — Telephone Encounter (Signed)
Printed avs and calender of upcoming appointment. Per 11/21 los

## 2018-02-03 ENCOUNTER — Other Ambulatory Visit: Payer: Self-pay | Admitting: Oncology

## 2018-02-06 DIAGNOSIS — J449 Chronic obstructive pulmonary disease, unspecified: Secondary | ICD-10-CM | POA: Diagnosis not present

## 2018-02-20 ENCOUNTER — Ambulatory Visit (HOSPITAL_COMMUNITY)
Admission: RE | Admit: 2018-02-20 | Discharge: 2018-02-20 | Disposition: A | Discharge status: Home / Self Care | Payer: PPO | Source: Ambulatory Visit | Attending: Oncology | Admitting: Oncology

## 2018-02-20 DIAGNOSIS — C3491 Malignant neoplasm of unspecified part of right bronchus or lung: Secondary | ICD-10-CM | POA: Insufficient documentation

## 2018-02-20 DIAGNOSIS — C3490 Malignant neoplasm of unspecified part of unspecified bronchus or lung: Secondary | ICD-10-CM | POA: Diagnosis not present

## 2018-02-20 MED ORDER — IOHEXOL 300 MG/ML  SOLN
100.0000 mL | Freq: Once | INTRAMUSCULAR | Status: AC | PRN
  Administered 2018-02-20: 100 mL via INTRAVENOUS

## 2018-02-20 MED ORDER — SODIUM CHLORIDE (PF) 0.9 % IJ SOLN
INTRAMUSCULAR | Status: AC
  Filled 2018-02-20: qty 50

## 2018-02-22 ENCOUNTER — Inpatient Hospital Stay: Payer: PPO

## 2018-02-22 ENCOUNTER — Inpatient Hospital Stay: Payer: PPO | Attending: Internal Medicine

## 2018-02-22 ENCOUNTER — Encounter: Payer: Self-pay | Admitting: Internal Medicine

## 2018-02-22 ENCOUNTER — Telehealth: Payer: Self-pay | Admitting: Internal Medicine

## 2018-02-22 ENCOUNTER — Inpatient Hospital Stay (HOSPITAL_BASED_OUTPATIENT_CLINIC_OR_DEPARTMENT_OTHER): Payer: PPO | Admitting: Internal Medicine

## 2018-02-22 VITALS — BP 155/52 | HR 94 | Temp 98.2°F | Resp 18 | Ht 64.0 in | Wt 185.3 lb

## 2018-02-22 DIAGNOSIS — Z5112 Encounter for antineoplastic immunotherapy: Secondary | ICD-10-CM

## 2018-02-22 DIAGNOSIS — Z79899 Other long term (current) drug therapy: Secondary | ICD-10-CM

## 2018-02-22 DIAGNOSIS — E039 Hypothyroidism, unspecified: Secondary | ICD-10-CM | POA: Diagnosis not present

## 2018-02-22 DIAGNOSIS — Z86711 Personal history of pulmonary embolism: Secondary | ICD-10-CM

## 2018-02-22 DIAGNOSIS — Z95828 Presence of other vascular implants and grafts: Secondary | ICD-10-CM

## 2018-02-22 DIAGNOSIS — J90 Pleural effusion, not elsewhere classified: Secondary | ICD-10-CM | POA: Insufficient documentation

## 2018-02-22 DIAGNOSIS — Z8543 Personal history of malignant neoplasm of ovary: Secondary | ICD-10-CM | POA: Insufficient documentation

## 2018-02-22 DIAGNOSIS — I10 Essential (primary) hypertension: Secondary | ICD-10-CM | POA: Insufficient documentation

## 2018-02-22 DIAGNOSIS — Z7901 Long term (current) use of anticoagulants: Secondary | ICD-10-CM | POA: Insufficient documentation

## 2018-02-22 DIAGNOSIS — J449 Chronic obstructive pulmonary disease, unspecified: Secondary | ICD-10-CM

## 2018-02-22 DIAGNOSIS — Z5111 Encounter for antineoplastic chemotherapy: Secondary | ICD-10-CM | POA: Diagnosis not present

## 2018-02-22 DIAGNOSIS — C3491 Malignant neoplasm of unspecified part of right bronchus or lung: Secondary | ICD-10-CM

## 2018-02-22 DIAGNOSIS — R5383 Other fatigue: Secondary | ICD-10-CM | POA: Insufficient documentation

## 2018-02-22 DIAGNOSIS — I700 Atherosclerosis of aorta: Secondary | ICD-10-CM | POA: Insufficient documentation

## 2018-02-22 DIAGNOSIS — E119 Type 2 diabetes mellitus without complications: Secondary | ICD-10-CM

## 2018-02-22 DIAGNOSIS — F419 Anxiety disorder, unspecified: Secondary | ICD-10-CM | POA: Diagnosis not present

## 2018-02-22 DIAGNOSIS — E785 Hyperlipidemia, unspecified: Secondary | ICD-10-CM | POA: Diagnosis not present

## 2018-02-22 DIAGNOSIS — G4730 Sleep apnea, unspecified: Secondary | ICD-10-CM

## 2018-02-22 DIAGNOSIS — C3481 Malignant neoplasm of overlapping sites of right bronchus and lung: Secondary | ICD-10-CM

## 2018-02-22 DIAGNOSIS — E669 Obesity, unspecified: Secondary | ICD-10-CM | POA: Insufficient documentation

## 2018-02-22 LAB — CBC WITH DIFFERENTIAL (CANCER CENTER ONLY)
Abs Immature Granulocytes: 0.02 10*3/uL (ref 0.00–0.07)
Basophils Absolute: 0 10*3/uL (ref 0.0–0.1)
Basophils Relative: 1 %
Eosinophils Absolute: 0.2 10*3/uL (ref 0.0–0.5)
Eosinophils Relative: 4 %
HCT: 31.6 % — ABNORMAL LOW (ref 36.0–46.0)
HEMOGLOBIN: 9.9 g/dL — AB (ref 12.0–15.0)
IMMATURE GRANULOCYTES: 0 %
LYMPHS ABS: 1.5 10*3/uL (ref 0.7–4.0)
Lymphocytes Relative: 24 %
MCH: 34 pg (ref 26.0–34.0)
MCHC: 31.3 g/dL (ref 30.0–36.0)
MCV: 108.6 fL — ABNORMAL HIGH (ref 80.0–100.0)
Monocytes Absolute: 0.8 10*3/uL (ref 0.1–1.0)
Monocytes Relative: 13 %
NEUTROS ABS: 3.8 10*3/uL (ref 1.7–7.7)
Neutrophils Relative %: 58 %
Platelet Count: 300 10*3/uL (ref 150–400)
RBC: 2.91 MIL/uL — AB (ref 3.87–5.11)
RDW: 15 % (ref 11.5–15.5)
WBC: 6.3 10*3/uL (ref 4.0–10.5)
nRBC: 0 % (ref 0.0–0.2)

## 2018-02-22 LAB — CMP (CANCER CENTER ONLY)
ALT: 8 U/L (ref 0–44)
AST: 22 U/L (ref 15–41)
Albumin: 3.1 g/dL — ABNORMAL LOW (ref 3.5–5.0)
Alkaline Phosphatase: 62 U/L (ref 38–126)
Anion gap: 8 (ref 5–15)
BUN: 7 mg/dL — ABNORMAL LOW (ref 8–23)
CALCIUM: 9.2 mg/dL (ref 8.9–10.3)
CO2: 34 mmol/L — AB (ref 22–32)
Chloride: 107 mmol/L (ref 98–111)
Creatinine: 0.77 mg/dL (ref 0.44–1.00)
GFR, Est AFR Am: >60 mL/min (ref >60)
Glucose, Bld: 100 mg/dL — ABNORMAL HIGH (ref 70–99)
Potassium: 3.7 mmol/L (ref 3.5–5.1)
Sodium: 149 mmol/L — ABNORMAL HIGH (ref 135–145)
Total Bilirubin: 0.3 mg/dL (ref 0.3–1.2)
Total Protein: 6.8 g/dL (ref 6.5–8.1)

## 2018-02-22 LAB — TSH: TSH: 1.794 u[IU]/mL (ref 0.308–3.960)

## 2018-02-22 MED ORDER — SODIUM CHLORIDE 0.9% FLUSH
10.0000 mL | INTRAVENOUS | Status: DC | PRN
  Administered 2018-02-22: 10 mL
  Filled 2018-02-22: qty 10

## 2018-02-22 MED ORDER — ONDANSETRON HCL 4 MG/2ML IJ SOLN
8.0000 mg | Freq: Once | INTRAMUSCULAR | Status: AC
  Administered 2018-02-22: 8 mg via INTRAVENOUS

## 2018-02-22 MED ORDER — HEPARIN SOD (PORK) LOCK FLUSH 100 UNIT/ML IV SOLN
500.0000 [IU] | Freq: Once | INTRAVENOUS | Status: AC | PRN
  Administered 2018-02-22: 500 [IU]
  Filled 2018-02-22: qty 5

## 2018-02-22 MED ORDER — ONDANSETRON HCL 4 MG/2ML IJ SOLN
INTRAMUSCULAR | Status: AC
  Filled 2018-02-22: qty 4

## 2018-02-22 MED ORDER — SODIUM CHLORIDE 0.9 % IV SOLN
Freq: Once | INTRAVENOUS | Status: AC
  Administered 2018-02-22: 12:00:00 via INTRAVENOUS
  Filled 2018-02-22: qty 250

## 2018-02-22 MED ORDER — DEXAMETHASONE SODIUM PHOSPHATE 10 MG/ML IJ SOLN
INTRAMUSCULAR | Status: AC
  Filled 2018-02-22: qty 1

## 2018-02-22 MED ORDER — SODIUM CHLORIDE 0.9 % IV SOLN
200.0000 mg | Freq: Once | INTRAVENOUS | Status: AC
  Administered 2018-02-22: 200 mg via INTRAVENOUS
  Filled 2018-02-22: qty 8

## 2018-02-22 MED ORDER — SODIUM CHLORIDE 0.9 % IV SOLN
485.0000 mg/m2 | Freq: Once | INTRAVENOUS | Status: AC
  Administered 2018-02-22: 1000 mg via INTRAVENOUS
  Filled 2018-02-22: qty 40

## 2018-02-22 MED ORDER — DEXAMETHASONE SODIUM PHOSPHATE 10 MG/ML IJ SOLN
10.0000 mg | Freq: Once | INTRAMUSCULAR | Status: AC
  Administered 2018-02-22: 10 mg via INTRAVENOUS

## 2018-02-22 NOTE — Telephone Encounter (Signed)
Printed calendar and avs. °

## 2018-02-22 NOTE — Progress Notes (Signed)
[place] [phone]   [phone]) [phone]  OFFICE PROGRESS NOTE  Erin Battles, MD [place] [address]  DIAGNOSIS:  1) Stage IV (T3, N0, M1 a)non-small cell lung cancer, well-differentiated adenocarcinoma presented with multifocal disease involving mainly the right upper lobe as well as the right middle lobe and left lower lobe of the lung diagnosed in May 2019. 2) bilateral small pulmonary emboli diagnosed incidentally on CT scan of the chest on October 16, 2017  Guardant 360 testing was negative for actionable mutations.  PRIOR THERAPY:None  CURRENT THERAPY: 1) Carboplatin for an AUC of 5, Alimta 500 mg/m2 and Keytruda 200 mg IV given every 3 weeks. First dosegiven on 08/18/2017.Status post 9 cycles.  Starting from cycle #5 the patient is on maintenance Alimta and Ketruda (pembrolizumab) every 3 weeks. 2) Xarelto 15 mg p.o. twice daily for the first 3 weeks followed by 20 mg p.o. Daily.  INTERVAL HISTORY: Erin Myers 77 y.o. female returns to the clinic today for follow-up visit accompanied by her husband.  The patient is feeling fine today with no concerning complaints except for the baseline shortness of breath and she is currently on home oxygen.  She also is complaining of postnasal drainage.  She denied having any chest pain or hemoptysis.  She has no nausea, vomiting, diarrhea or constipation.  She denied having any significant weight loss or night sweats.  She has no fever or chills.  She has no headache or visual changes.  She continues to tolerate her maintenance treatment with Alimta and Keytruda fairly well.  The patient had repeat CT scan of the chest, abdomen and pelvis performed recently and she is here for evaluation and discussion of her scan results.   MEDICAL HISTORY: Past Medical History:  Diagnosis Date  . Anxiety   . Diabetes mellitus without complication ([place])   . Dyslipidemia   . HTN  (hypertension)   . Hyperlipemia   . Hypothyroid   . Hypothyroidism   . Lung cancer ([place]) 2000   Carcinoid, s/p RLL lobectomy  . Obesity   . Ovarian cancer ([place])   . Palpitations 2009 and 2006   Negative nuclear stress test   . Sleep apnea    on C-pap    ALLERGIES:  is allergic to adhesive [tape]; codeine; lactose intolerance (gi); monosodium glutamate; and sulfa antibiotics.  MEDICATIONS:  Current Outpatient Medications  Medication Sig Dispense Refill  . acetaminophen (TYLENOL) 500 MG tablet Take 500 mg by mouth every 6 (six) hours as needed.    . benzonatate (TESSALON) 200 MG capsule Take 1 capsule (200 mg total) by mouth 3 (three) times daily as needed for cough. 30 capsule 1  . budesonide-formoterol (SYMBICORT) 160-4.5 MCG/ACT inhaler Inhale 2 puffs into the lungs 2 (two) times daily. 1 Inhaler 6  . cholecalciferol (VITAMIN D3) 25 MCG (1000 UT) tablet Take 1,000 Units by mouth daily.    . clorazepate (TRANXENE) 7.5 MG tablet Take 7.5 mg by mouth 2 (two) times daily as needed for anxiety.    . folic acid (FOLVITE) 1 MG tablet TAKE 1 TABLET BY MOUTH DAILY. 30 tablet 2  . guaiFENesin-codeine (CHERATUSSIN AC) 100-10 MG/5ML syrup Take 5 mLs by mouth every 6 (six) hours as needed for cough. 240 mL 0  . ipratropium-albuterol (DUONEB) 0.5-2.5 (3) MG/3ML SOLN INHALE THE CONTENTS OF 1 VIAL VIA NEBULIZER 3 TIMES A DAY. 360 mL 2  . levothyroxine (SYNTHROID, LEVOTHROID) 175 MCG tablet Take  175 mcg by mouth daily before breakfast.    . lidocaine-prilocaine (EMLA) cream Apply 1 application topically as needed. 30 g 0  . losartan-hydrochlorothiazide (HYZAAR) 50-12.5 MG per tablet Take 1 tablet by mouth every morning.     . metoprolol succinate (TOPROL-XL) 50 MG 24 hr tablet Take 50 mg by mouth 2 (two) times daily.     . Multiple Vitamins-Minerals (VISION FORMULA 2 PO) Take by mouth. OTC "Vision pills for Macular degeneration"    . potassium chloride SA (K-DUR,KLOR-CON) 20 MEQ tablet Take 1 tablet  (20 mEq total) by mouth daily. 7 tablet 0  . rosuvastatin (CRESTOR) 10 MG tablet Take 10 mg by mouth every evening.     . valACYclovir (VALTREX) 500 MG tablet Take 500 mg by mouth daily.    [name] 20 MG TABS tablet TAKE 1 TABLET (20 MG TOTAL) BY MOUTH DAILY WITH SUPPER. 30 tablet 1  . prochlorperazine (COMPAZINE) 10 MG tablet Take 1 tablet (10 mg total) by mouth every 6 (six) hours as needed for nausea or vomiting. (Patient not taking: Reported on 12/19/2017) 30 tablet 0   No current facility-administered medications for this visit.     SURGICAL HISTORY:  Past Surgical History:  Procedure Laterality Date  . ABDOMINAL HYSTERECTOMY  2001  . GRADED EXERCISE TOLERANCE TEST  08/2015    Blood pressure demonstrated a hypertensive response to exercise.  There was less than 0.43m of horizontal ST segment depression in the inferolateral leads. No diagnostic criterial for ischemia. Moderately impaired exercise tolerance. The patient achieved 5.8 mets. This is a low risk study.  . IR FLUORO GUIDE PORT INSERTION RIGHT  08/17/2017  . IR [place] VASC ACCESS RIGHT  08/17/2017  . LOBECTOMY  2000   RLL  . NM MYOCAR PERF WALL MOTION  08/20/2007   EF 74%  EXERCISE CAPACITY 7 METS  . OOPHORECTOMY  2001  . TONSILLECTOMY    . VIDEO BRONCHOSCOPY Bilateral 07/20/2017   Procedure: VIDEO BRONCHOSCOPY WITH FLUORO;  Surgeon: BCollene Gobble MD;  [facility];  Service: Cardiopulmonary;  Laterality: Bilateral;    REVIEW OF SYSTEMS:  Constitutional: positive for fatigue Eyes: negative Ears, nose, mouth, throat, and face: positive for Postnasal drainage. Respiratory: positive for cough and dyspnea on exertion Cardiovascular: negative Gastrointestinal: negative Genitourinary:negative Integument/breast: negative Hematologic/lymphatic: negative Musculoskeletal:negative Neurological: negative Behavioral/Psych: negative Endocrine: negative Allergic/Immunologic: negative   PHYSICAL EXAMINATION: General  appearance: alert, cooperative, fatigued and no distress Head: Normocephalic, without obvious abnormality, atraumatic Neck: no adenopathy, no JVD, supple, symmetrical, trachea midline and thyroid not enlarged, symmetric, no tenderness/mass/nodules Lymph nodes: Cervical, supraclavicular, and axillary nodes normal. Resp: clear to auscultation bilaterally Back: symmetric, no curvature. ROM normal. No CVA tenderness. Cardio: regular rate and rhythm, S1, S2 normal, no murmur, click, rub or gallop GI: soft, non-tender; bowel sounds normal; no masses,  no organomegaly Extremities: extremities normal, atraumatic, no cyanosis or edema Neurologic: Alert and oriented X 3, normal strength and tone. Normal symmetric reflexes. Normal coordination and gait  ECOG PERFORMANCE STATUS: 1 - Symptomatic but completely ambulatory  Blood pressure (!) 155/52, pulse 94, temperature 98.2 F (36.8 C), temperature source Oral, resp. rate 18, height _0  (1.626 m), weight 185 lb 4.8 oz (84.1 kg), SpO2 91 %.  LABORATORY DATA: Lab Results  Component Value Date   WBC 6.3 02/22/2018   HGB 9.9 (L) 02/22/2018   HCT 31.6 (L) 02/22/2018   MCV 108.6 (H) 02/22/2018   PLT 300 02/22/2018  Chemistry      Component Value Date/Time   NA 143 02/01/2018 0924   K 3.7 02/01/2018 0924   CL 104 02/01/2018 0924   CO2 32 02/01/2018 0924   BUN 7 (L) 02/01/2018 0924   CREATININE 0.81 02/01/2018 0924      Component Value Date/Time   CALCIUM 9.1 02/01/2018 0924   ALKPHOS 63 02/01/2018 0924   AST 17 02/01/2018 0924   ALT 6 02/01/2018 0924   BILITOT 0.4 02/01/2018 0924       RADIOGRAPHIC STUDIES: Ct Chest W Contrast  Result Date: 02/20/2018 CLINICAL DATA:  CLINICAL DATA Restaging lung cancer. EXAM: CT CHEST, ABDOMEN, AND PELVIS WITH CONTRAST TECHNIQUE: Multidetector CT imaging of the chest, abdomen and pelvis was performed following the standard protocol during bolus administration of intravenous contrast. CONTRAST:   123m OMNIPAQUE IOHEXOL 300 MG/ML  SOLN COMPARISON:  12/14/2017 FINDINGS: CT CHEST FINDINGS Cardiovascular: The heart is normal in size. No pericardial effusion. Stable atherosclerotic calcifications involving the thoracic aorta but no dissection. The branch vessels are patent. No significant coronary artery calcifications. Mediastinum/Nodes: Stable mediastinal and hilar lymph nodes. Right paratracheal node on image number 22 measures 7.5 mm. 10 mm right hilar node on image number 27. 11 mm subcarinal lymph node on image number 31. 7.5 mm left infrahilar node on image number 30. Lungs/Pleura: Persistent ill-defined ground-glass opacity in the right upper lobe extending back to the major fissure. The more solid peripheral portion appears improved since the prior CT scan. This could represent treated tumor with interstitial spread or possibly surrounding radiation changes. Stable vague ground-glass opacity and extensive tree-in-bud pattern in the lower lobes bilaterally. This is more likely chronic inflammation or atypical infection such as MAC. Stable right middle lobe bronchiectasis. No new pulmonary lesions or evidence of metastatic pulmonary nodules. There is a very small right pleural effusion. Stable underlying emphysematous changes. Musculoskeletal: No breast masses, chest wall masses, supraclavicular or axillary adenopathy. No significant bony findings. CT ABDOMEN PELVIS FINDINGS Hepatobiliary: No focal hepatic lesions or intrahepatic biliary dilatation. The gallbladder is normal. No common bile duct dilatation. Pancreas: No mass, inflammation or ductal dilatation. Spleen: Normal size.  No focal lesions. Adrenals/Urinary Tract: The adrenal glands and kidneys are unremarkable and stable. The bladder is unremarkable. Stomach/Bowel: The stomach, duodenum, small bowel and colon are unremarkable. No acute inflammatory changes, mass lesions or obstructive findings. The terminal ileum is normal. Vascular/Lymphatic:  Moderate atherosclerotic calcifications involving the aorta but no aneurysm or dissection. The branch vessels are patent. The major venous structures are patent. No mesenteric or retroperitoneal mass or adenopathy. Small scattered lymph nodes are stable. Reproductive: The uterus and ovaries are surgically absent. Other: No pelvic mass or adenopathy. No free pelvic fluid collections. No inguinal mass or adenopathy. No abdominal wall hernia or subcutaneous lesions. Musculoskeletal: No significant bony findings. IMPRESSION: 1. Persistent right upper lobe findings with perhaps slight contraction or decreased size of the more solid-appearing component. 2. Persistent fairly extensive patchy ground-glass opacity and tree-in-bud appearance in the lower lung zones. This is most likely chronic inflammation or atypical infection such as MAC. 3. No new worrisome pulmonary lesions or evidence of pulmonary metastatic disease. 4. Stable borderline mediastinal and hilar lymph nodes. 5. No findings for metastatic disease involving the abdomen/pelvis or bony structures. Electronically Signed   By: PMarijo SanesM.D.   On: 02/20/2018 15:19   Ct Abdomen Pelvis W Contrast  Result Date: 02/20/2018 CLINICAL DATA:  CLINICAL DATA Restaging lung cancer. EXAM: CT CHEST, ABDOMEN,  AND PELVIS WITH CONTRAST TECHNIQUE: Multidetector CT imaging of the chest, abdomen and pelvis was performed following the standard protocol during bolus administration of intravenous contrast. CONTRAST:  170m OMNIPAQUE IOHEXOL 300 MG/ML  SOLN COMPARISON:  12/14/2017 FINDINGS: CT CHEST FINDINGS Cardiovascular: The heart is normal in size. No pericardial effusion. Stable atherosclerotic calcifications involving the thoracic aorta but no dissection. The branch vessels are patent. No significant coronary artery calcifications. Mediastinum/Nodes: Stable mediastinal and hilar lymph nodes. Right paratracheal node on image number 22 measures 7.5 mm. 10 mm right hilar  node on image number 27. 11 mm subcarinal lymph node on image number 31. 7.5 mm left infrahilar node on image number 30. Lungs/Pleura: Persistent ill-defined ground-glass opacity in the right upper lobe extending back to the major fissure. The more solid peripheral portion appears improved since the prior CT scan. This could represent treated tumor with interstitial spread or possibly surrounding radiation changes. Stable vague ground-glass opacity and extensive tree-in-bud pattern in the lower lobes bilaterally. This is more likely chronic inflammation or atypical infection such as MAC. Stable right middle lobe bronchiectasis. No new pulmonary lesions or evidence of metastatic pulmonary nodules. There is a very small right pleural effusion. Stable underlying emphysematous changes. Musculoskeletal: No breast masses, chest wall masses, supraclavicular or axillary adenopathy. No significant bony findings. CT ABDOMEN PELVIS FINDINGS Hepatobiliary: No focal hepatic lesions or intrahepatic biliary dilatation. The gallbladder is normal. No common bile duct dilatation. Pancreas: No mass, inflammation or ductal dilatation. Spleen: Normal size.  No focal lesions. Adrenals/Urinary Tract: The adrenal glands and kidneys are unremarkable and stable. The bladder is unremarkable. Stomach/Bowel: The stomach, duodenum, small bowel and colon are unremarkable. No acute inflammatory changes, mass lesions or obstructive findings. The terminal ileum is normal. Vascular/Lymphatic: Moderate atherosclerotic calcifications involving the aorta but no aneurysm or dissection. The branch vessels are patent. The major venous structures are patent. No mesenteric or retroperitoneal mass or adenopathy. Small scattered lymph nodes are stable. Reproductive: The uterus and ovaries are surgically absent. Other: No pelvic mass or adenopathy. No free pelvic fluid collections. No inguinal mass or adenopathy. No abdominal wall hernia or subcutaneous  lesions. Musculoskeletal: No significant bony findings. IMPRESSION: 1. Persistent right upper lobe findings with perhaps slight contraction or decreased size of the more solid-appearing component. 2. Persistent fairly extensive patchy ground-glass opacity and tree-in-bud appearance in the lower lung zones. This is most likely chronic inflammation or atypical infection such as MAC. 3. No new worrisome pulmonary lesions or evidence of pulmonary metastatic disease. 4. Stable borderline mediastinal and hilar lymph nodes. 5. No findings for metastatic disease involving the abdomen/pelvis or bony structures. Electronically Signed   By: PMarijo SanesM.D.   On: 02/20/2018 15:19    ASSESSMENT AND PLAN: This is a very pleasant 77years old white female with stage IV non-small cell lung cancer, adenocarcinoma with no actionable mutations. She is currently undergoing systemic chemotherapy with carboplatin, Alimta and Ketruda (pembrolizumab) status post 9 cycles.  Starting from cycle #5 she is on maintenance treatment with Alimta and Ketruda (pembrolizumab). She is tolerating her treatment well with no concerning adverse effects. The patient had repeat CT scan of the chest, abdomen and pelvis performed recently.  I personally and independently reviewed the scans and discussed the results with the patient and her husband.  Her scan showed no concerning findings for disease progression. I recommended for the patient to continue her current treatment with maintenance Alimta and Keytruda every 3 weeks. She will proceed with cycle #  10 today as scheduled. I will see her back for follow-up visit in 3 weeks for evaluation before starting cycle #11. For the COPD, she will continue on home oxygen and she has follow-up appointment with her pulmonologist in few weeks. The patient will come back for follow-up visit in 3 weeks for evaluation before the next cycle of her treatment. The patient was advised to call immediately if  she has any concerning symptoms in the interval. The patient voices understanding of current disease status and treatment options and is in agreement with the current care plan. All questions were answered. The patient knows to call the clinic with any problems, questions or concerns. We can certainly see the patient much sooner if necessary.  Disclaimer: This note was dictated with voice recognition software. Similar sounding words can inadvertently be transcribed and may not be corrected upon review.

## 2018-02-22 NOTE — Patient Instructions (Signed)
[place] Discharge Instructions for Patients Receiving Chemotherapy  Today you received the following chemotherapy agents:  keytruda & alimta  To help prevent nausea and vomiting after your treatment, we encourage you to take your nausea medication as prescribed.    If you develop nausea and vomiting that is not controlled by your nausea medication, call the clinic.   BELOW ARE SYMPTOMS THAT SHOULD BE REPORTED IMMEDIATELY:  *FEVER GREATER THAN 100.5 F  *CHILLS WITH OR WITHOUT FEVER  NAUSEA AND VOMITING THAT IS NOT CONTROLLED WITH YOUR NAUSEA MEDICATION  *UNUSUAL SHORTNESS OF BREATH  *UNUSUAL BRUISING OR BLEEDING  TENDERNESS IN MOUTH AND THROAT WITH OR WITHOUT PRESENCE OF ULCERS  *URINARY PROBLEMS  *BOWEL PROBLEMS  UNUSUAL RASH Items with * indicate a potential emergency and should be followed up as soon as possible.  Feel free to call the clinic should you have any questions or concerns. The clinic phone number is [phone].  Please show the [facility] at check-in to the Emergency Department and triage nurse.

## 2018-02-27 DIAGNOSIS — G4733 Obstructive sleep apnea (adult) (pediatric): Secondary | ICD-10-CM | POA: Diagnosis not present

## 2018-03-05 ENCOUNTER — Telehealth: Payer: Self-pay | Admitting: Cardiology

## 2018-03-05 NOTE — Telephone Encounter (Signed)
New Message   Pt c/o medication issue:  1. Name of Medication: Advil   2. How are you currently taking this medication (dosage and times per day)?   3. Are you having a reaction (difficulty breathing--STAT)?   4. What is your medication issue? Patient is currently on Xarelto and he is experiencing some back pain not caused by Xarelto. But she wants to know can she take Advil in addition to the Xarelto. Not daily but periodically. Please call to discuss.

## 2018-03-05 NOTE — Telephone Encounter (Signed)
Spoke with pt, okay given for her to take an occ advil for the back pain. She was given the okay to take advil as needed once weekly if needed.

## 2018-03-07 ENCOUNTER — Emergency Department (HOSPITAL_COMMUNITY)
Admission: EM | Admit: 2018-03-07 | Discharge: 2018-03-07 | Disposition: A | Discharge status: Home / Self Care | Payer: PPO | Attending: Emergency Medicine | Admitting: Emergency Medicine

## 2018-03-07 ENCOUNTER — Encounter (HOSPITAL_COMMUNITY): Payer: Self-pay

## 2018-03-07 ENCOUNTER — Other Ambulatory Visit: Payer: Self-pay

## 2018-03-07 DIAGNOSIS — E119 Type 2 diabetes mellitus without complications: Secondary | ICD-10-CM | POA: Diagnosis not present

## 2018-03-07 DIAGNOSIS — Z7901 Long term (current) use of anticoagulants: Secondary | ICD-10-CM | POA: Diagnosis not present

## 2018-03-07 DIAGNOSIS — I10 Essential (primary) hypertension: Secondary | ICD-10-CM | POA: Diagnosis not present

## 2018-03-07 DIAGNOSIS — M25572 Pain in left ankle and joints of left foot: Secondary | ICD-10-CM | POA: Diagnosis not present

## 2018-03-07 DIAGNOSIS — Z87891 Personal history of nicotine dependence: Secondary | ICD-10-CM | POA: Diagnosis not present

## 2018-03-07 DIAGNOSIS — E039 Hypothyroidism, unspecified: Secondary | ICD-10-CM | POA: Insufficient documentation

## 2018-03-07 DIAGNOSIS — M79605 Pain in left leg: Secondary | ICD-10-CM | POA: Insufficient documentation

## 2018-03-07 DIAGNOSIS — M25562 Pain in left knee: Secondary | ICD-10-CM | POA: Diagnosis not present

## 2018-03-07 DIAGNOSIS — M2550 Pain in unspecified joint: Secondary | ICD-10-CM

## 2018-03-07 DIAGNOSIS — Z79899 Other long term (current) drug therapy: Secondary | ICD-10-CM | POA: Diagnosis not present

## 2018-03-07 MED ORDER — HYDROCODONE-ACETAMINOPHEN 5-325 MG PO TABS
1.0000 | ORAL_TABLET | Freq: Once | ORAL | Status: AC
  Administered 2018-03-07: 1 via ORAL
  Filled 2018-03-07: qty 1

## 2018-03-07 MED ORDER — HYDROCODONE-ACETAMINOPHEN 5-325 MG PO TABS: 1.0000 | ORAL_TABLET | ORAL | 0 refills | Status: DC | PRN

## 2018-03-07 NOTE — ED Provider Notes (Signed)
[facility] DEPT Provider Note   CSN: [id number] Arrival date & time: 03/07/18  1136     History   Chief Complaint Chief Complaint  Patient presents with  . Leg Pain    left    HPI Erin Myers is a 77 y.o. female.  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  Denies any fever or chills.  No warmth or swelling to the joint.  Pain waxes and wanes and is worse with standing or movement.  Has been medicating with Tylenol without relief.     Past Medical History:  Diagnosis Date  . Anxiety   . Diabetes mellitus without complication ([place])   . Dyslipidemia   . HTN (hypertension)   . Hyperlipemia   . Hypothyroid   . Hypothyroidism   . Lung cancer ([place]) 2000   Carcinoid, s/p RLL lobectomy  . Obesity   . Ovarian cancer ([place])   . Palpitations 2009 and 2006   Negative nuclear stress test   . Sleep apnea    on C-pap    Patient Active Problem List   Diagnosis Date Noted  . Cough 11/10/2017  . Bilateral pulmonary embolism ([place]) 10/19/2017  . Port-A-Cath in place 08/18/2017  . Encounter for antineoplastic immunotherapy 08/10/2017  . Encounter for antineoplastic chemotherapy 08/10/2017  . Adenocarcinoma of right lung, stage 4 ([place]) 07/31/2017  . Goals of care, counseling/discussion 07/31/2017  . Sinusitis 07/19/2017  . Acute on chronic respiratory failure with hypoxia ([place]) 07/19/2017  . Hypokalemia 07/19/2017  . Sepsis ([place]) 07/18/2017  . Abnormal CT of the chest 07/14/2017  . Hypoxemia 07/03/2017  . Community acquired pneumonia of right upper lobe of lung ([place]) 05/30/2017  . Dyspnea 05/02/2017  . 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 History of posttraumatic stress disorder (PTSD) 05/14/2015  . Obesity (BMI 30-39.9) 08/20/2012  . Family history of coronary artery disease  08/17/2012  . Palpitations   . OSA on CPAP   . Essential hypertension   . Dyslipidemia, goal LDL below 130   . Hypothyroid     Past Surgical History:  Procedure Laterality Date  . ABDOMINAL HYSTERECTOMY  2001  . GRADED EXERCISE TOLERANCE TEST  08/2015    Blood pressure demonstrated a hypertensive response to exercise.  There was less than 0.68mm of horizontal ST segment depression in the inferolateral leads. No diagnostic criterial for ischemia. Moderately impaired exercise tolerance. The patient achieved 5.8 mets. This is a low risk study.  . IR FLUORO GUIDE PORT INSERTION RIGHT  08/17/2017  . IR US GUIDE VASC ACCESS RIGHT  08/17/2017  . LOBECTOMY  2000   RLL  . NM MYOCAR PERF WALL MOTION  08/20/2007   EF 74%  EXERCISE CAPACITY 7 METS  . OOPHORECTOMY  2001  . TONSILLECTOMY    . VIDEO BRONCHOSCOPY Bilateral 07/20/2017   Procedure: VIDEO BRONCHOSCOPY WITH FLUORO;  Surgeon: Collene Gobble, MD;  Location: [facility];  Service: Cardiopulmonary;  Laterality: Bilateral;     OB History   No obstetric history on file.      Home Medications    Prior to Admission medications   Medication Sig Start Date End Date Taking? Authorizing Provider  acetaminophen (TYLENOL) 500 MG tablet Take 500 mg by mouth every 6 (six) hours as needed.    [provider]  benzonatate (TESSALON) 200 MG capsule Take 1 capsule (200 mg total) by mouth 3 (three) times daily as needed for cough. 11/10/17   Lauraine Rinne, NP  budesonide-formoterol (SYMBICORT) 160-4.5 MCG/ACT inhaler Inhale 2 puffs into the lungs 2 (two) times daily. 07/03/17   Noralee Space, MD  cholecalciferol (VITAMIN D3) 25 MCG (1000 UT) tablet Take 1,000 Units by mouth daily.    [provider]  clorazepate (TRANXENE) 7.5 MG tablet Take 7.5 mg by mouth 2 (two) times daily as needed for anxiety.    [provider]  folic acid (FOLVITE) 1 MG tablet TAKE 1 TABLET BY MOUTH DAILY. 02/05/18   Maryanna Shape, NP    guaiFENesin-codeine (CHERATUSSIN AC) 100-10 MG/5ML syrup Take 5 mLs by mouth every 6 (six) hours as needed for cough. 01/23/18   Noralee Space, MD  ipratropium-albuterol (DUONEB) 0.5-2.5 (3) MG/3ML SOLN INHALE THE CONTENTS OF 1 VIAL VIA NEBULIZER 3 TIMES A DAY. 12/06/17   Noralee Space, MD  levothyroxine (SYNTHROID, LEVOTHROID) 175 MCG tablet Take 175 mcg by mouth daily before breakfast.    [provider]  lidocaine-prilocaine (EMLA) cream Apply 1 application topically as needed. 08/10/17   Maryanna Shape, NP  losartan-hydrochlorothiazide (HYZAAR) 50-12.5 MG per tablet Take 1 tablet by mouth every morning.  11/11/13   [provider]  metoprolol succinate (TOPROL-XL) 50 MG 24 hr tablet Take 50 mg by mouth 2 (two) times daily.     [provider]  Multiple Vitamins-Minerals (VISION FORMULA 2 PO) Take by mouth. OTC "Vision pills for Macular degeneration"    [provider]  potassium chloride SA (K-DUR,KLOR-CON) 20 MEQ tablet Take 1 tablet (20 mEq total) by mouth daily. 11/09/17   Maryanna Shape, NP  prochlorperazine (COMPAZINE) 10 MG tablet Take 1 tablet (10 mg total) by mouth every 6 (six) hours as needed for nausea or vomiting. Patient not taking: Reported on 12/19/2017 08/10/17   Maryanna Shape, NP  rosuvastatin (CRESTOR) 10 MG tablet Take 10 mg by mouth every evening.     [provider]  valACYclovir (VALTREX) 500 MG tablet Take 500 mg by mouth daily.    [provider]  XARELTO 20 MG TABS tablet TAKE 1 TABLET (20 MG TOTAL) BY MOUTH DAILY WITH SUPPER. 01/10/18   Curt Bears, MD    Family History Family History  Problem Relation Age of Onset  . Coronary artery disease Mother 40       died of an MI  . Heart attack Mother   . Suicidality Sister   . CVA Brother 59  . CVA Father     Social History Social History   Tobacco Use  . Smoking status: Former Smoker    Packs/day: 0.75    Years: 30.00    Pack years: 22.50     Types: Cigarettes    Last attempt to quit: 08/18/1991    Years since quitting: 26.5  . Smokeless tobacco: Never Used  Substance Use Topics  . Alcohol use: No    Alcohol/week: 0.0 standard drinks  . Drug use: No     Allergies   Adhesive [tape]; Codeine; Lactose intolerance (gi); Monosodium glutamate; and Sulfa antibiotics   Review of Systems Review of Systems  All other systems reviewed and are negative.    Physical Exam Updated Vital Signs BP (!) 148/63 (BP Location: Right Arm)   Pulse 86   Temp 98.6 F (37 C) (Oral)   Resp 18   Ht  1.626 m (5\' 4" )   Wt 84.1 kg   SpO2 100%   BMI 31.81 kg/m   Physical Exam Vitals signs and nursing note reviewed.  Constitutional:      General: She is not in acute distress.    Appearance: Normal appearance. She is well-developed. She is not toxic-appearing.  HENT:     Head: Normocephalic and atraumatic.  Eyes:     General: Lids are normal.     Conjunctiva/sclera: Conjunctivae normal.     Pupils: Pupils are equal, round, and reactive to light.  Neck:     Musculoskeletal: Normal range of motion and neck supple.     Thyroid: No thyroid mass.     Trachea: No tracheal deviation.  Cardiovascular:     Rate and Rhythm: Normal rate and regular rhythm.     Heart sounds: Normal heart sounds. No murmur. No gallop.   Pulmonary:     Effort: Pulmonary effort is normal. No respiratory distress.     Breath sounds: Normal breath sounds. No stridor. No decreased breath sounds, wheezing, rhonchi or rales.  Abdominal:     General: Bowel sounds are normal. There is no distension.     Palpations: Abdomen is soft.     Tenderness: There is no abdominal tenderness. There is no rebound.  Musculoskeletal: Normal range of motion.        General: No tenderness.     Left knee: She exhibits normal range of motion, no swelling, no effusion and no erythema.     Left ankle: She exhibits normal range of motion and no swelling.  Skin:    General: Skin is warm  and dry.     Findings: No abrasion or rash.  Neurological:     Mental Status: She is alert and oriented to person, place, and time.     GCS: GCS eye subscore is 4. GCS verbal subscore is 5. GCS motor subscore is 6.     Cranial Nerves: No cranial nerve deficit.     Sensory: No sensory deficit.  Psychiatric:        Speech: Speech normal.        Behavior: Behavior normal.      ED Treatments / Results  Labs (all labs ordered are listed, but only abnormal results are displayed) Labs Reviewed - No data to display  EKG None  Radiology No results found.  Procedures Procedures (including critical care time)  Medications Ordered in ED Medications  HYDROcodone-acetaminophen (NORCO/VICODIN) 5-325 MG per tablet 1 tablet (has no administration in time range)     Initial Impression / Assessment and Plan / ED Course  I have reviewed the triage vital signs and the nursing notes.  Pertinent labs & imaging results that were available during my care of the patient were reviewed by me and considered in my medical decision making (see chart for details).     Patient medicated with hydrocodone here.  Patient's left knee and left ankle without signs of infection.  No suspicion for septic joint at this time.  She has no pain to her calf or thigh.  Low suspicion for DVT.  She has good dorsalis pedis pulse of her left foot.  No concern for vascular compromise.  Suspect some arthritic component.  Will be discharged home with return precautions  Final Clinical Impressions(s) / ED Diagnoses   Final diagnoses:  None    ED Discharge Orders    None       Lacretia Leigh, MD 03/07/18 1205

## 2018-03-07 NOTE — Discharge Instructions (Addendum)
Return here at once for fever, joint swelling, redness to your joints, or any other problems.  Follow-up with your doctor next week

## 2018-03-07 NOTE — ED Triage Notes (Signed)
Pt states she has been having pain in her left leg for 3 days. Pt states pain is wrost in her joints, shin, and calf. Pt is concerned for blood clots. Pt states last chemo tx on 02/22/18. Pt is on xarelto.

## 2018-03-08 DIAGNOSIS — J449 Chronic obstructive pulmonary disease, unspecified: Secondary | ICD-10-CM | POA: Diagnosis not present

## 2018-03-09 ENCOUNTER — Other Ambulatory Visit: Payer: Self-pay | Admitting: Internal Medicine

## 2018-03-09 DIAGNOSIS — I2699 Other pulmonary embolism without acute cor pulmonale: Secondary | ICD-10-CM

## 2018-03-11 NOTE — Progress Notes (Signed)
_0  ID: Erin Myers, female    DOB: [birth year], 77 y.o.   MRN: [id number]  Chief Complaint  Patient presents with  . Follow-up    pt reports of occ wheezing mainly with laying flat, sob with exertion, prod cough with white to yellow mucus & postnasal drip. pt states listed sx are baseline since last ov.     Referring provider:  [name], MD  HPI:  77 year old female patient seen in our office by Dr. Lenna Gilford.  Patient is also currently being seen by Dr. Earlie Server with oncology.  Patient is currently receiving chemo.  PMH: Stage IV (T3, N0, M1 a)non-small cell lung cancer, well-differentiated adenocarcinoma presented with multifocal disease involving mainly the right upper lobe as well as the right middle lobe and left lower lobe of the lung diagnosed in May 2019. Smoker/ Smoking History: Former smoker.  22.5-pack-year history. Maintenance: Symbicort 160 Pt of: Dr. Lenna Gilford  03/12/2018  - Visit   77 year old female former smoker followed in our office for emphysema, bronchiectasis.  Patient was previously followed by Dr. Lenna Gilford.  Patient needs new pulmonologist to establish with in our office.  Patient continues to follow-up with oncology -Dr. Earlie Server.  Patient reports that she has had no new symptoms since last office visit.  Patient reports she is had occasional postnasal drip and increased congestion.  Patient with an occasionally productive cough as well as episodes of shortness of breath with exertion and increased wheezing when lying flat.  Patient with known sick contacts, husband currently with upper respiratory infection.  He presents today with office visit with the patient.   Tests:  02/20/2018-CT chest with contrast- persistent right upper lobe findings with percent perhaps slight contraction or decreased size of the more solid-appearing component, persistent fairly extensive patchy groundglass opacity and tree-in-bud appearance in lower lung zones this most  likely chronic inflammation or atypical infection such as MAC, no new worrisome pulmonary lesions stable borderline mediastinal and hilar lymph nodes, stable right middle lobe bronchiectasis, stable underlying emphysematous changes  10/16/2017-CT chest chest-progressive solid consolidation part of the right upper lobe aggressive appearing bilateral lower lobe process likely interstitial small bilateral pulmonary emboli noted  08/09/2017-PET scan- borderline hypermetabolic right upper lobe consolidation and groundglass and hypometabolic patchy groundglass in both lower lobes  05/24/2017-echocardiogram LV ejection fraction 65 to 70%   05/02/2017-spirometry -normal spirometry  07/20/17 -bronchoscopy with BAL-Dr. Lamonte Sakai >>> Negative AFB, fungal, culture >>> Transbronchial brushings-malignant cells, consistent with adenocarcinoma >>> Surgical pathology-transbronchial biopsy right upper lobe, adenocarcinoma, well-differentiated  FENO:  No results found for: NITRICOXIDE  PFT: No flowsheet data found.  Imaging: Ct Chest W Contrast  Result Date: 02/20/2018 CLINICAL DATA:  CLINICAL DATA Restaging lung cancer. EXAM: CT CHEST, ABDOMEN, AND PELVIS WITH CONTRAST TECHNIQUE: Multidetector CT imaging of the chest, abdomen and pelvis was performed following the standard protocol during bolus administration of intravenous contrast. CONTRAST:  14m OMNIPAQUE IOHEXOL 300 MG/ML  SOLN COMPARISON:  12/14/2017 FINDINGS: CT CHEST FINDINGS Cardiovascular: The heart is normal in size. No pericardial effusion. Stable atherosclerotic calcifications involving the thoracic aorta but no dissection. The branch vessels are patent. No significant coronary artery calcifications. Mediastinum/Nodes: Stable mediastinal and hilar lymph nodes. Right paratracheal node on image number 22 measures 7.5 mm. 10 mm right hilar node on image number 27. 11 mm subcarinal lymph node on image number 31. 7.5 mm left infrahilar node on image number 30.  Lungs/Pleura: Persistent ill-defined ground-glass opacity in the right upper lobe extending back to the major  fissure. The more solid peripheral portion appears improved since the prior CT scan. This could represent treated tumor with interstitial spread or possibly surrounding radiation changes. Stable vague ground-glass opacity and extensive tree-in-bud pattern in the lower lobes bilaterally. This is more likely chronic inflammation or atypical infection such as MAC. Stable right middle lobe bronchiectasis. No new pulmonary lesions or evidence of metastatic pulmonary nodules. There is a very small right pleural effusion. Stable underlying emphysematous changes. Musculoskeletal: No breast masses, chest wall masses, supraclavicular or axillary adenopathy. No significant bony findings. CT ABDOMEN PELVIS FINDINGS Hepatobiliary: No focal hepatic lesions or intrahepatic biliary dilatation. The gallbladder is normal. No common bile duct dilatation. Pancreas: No mass, inflammation or ductal dilatation. Spleen: Normal size.  No focal lesions. Adrenals/Urinary Tract: The adrenal glands and kidneys are unremarkable and stable. The bladder is unremarkable. Stomach/Bowel: The stomach, duodenum, small bowel and colon are unremarkable. No acute inflammatory changes, mass lesions or obstructive findings. The terminal ileum is normal. Vascular/Lymphatic: Moderate atherosclerotic calcifications involving the aorta but no aneurysm or dissection. The branch vessels are patent. The major venous structures are patent. No mesenteric or retroperitoneal mass or adenopathy. Small scattered lymph nodes are stable. Reproductive: The uterus and ovaries are surgically absent. Other: No pelvic mass or adenopathy. No free pelvic fluid collections. No inguinal mass or adenopathy. No abdominal wall hernia or subcutaneous lesions. Musculoskeletal: No significant bony findings. IMPRESSION: 1. Persistent right upper lobe findings with perhaps slight  contraction or decreased size of the more solid-appearing component. 2. Persistent fairly extensive patchy ground-glass opacity and tree-in-bud appearance in the lower lung zones. This is most likely chronic inflammation or atypical infection such as MAC. 3. No new worrisome pulmonary lesions or evidence of pulmonary metastatic disease. 4. Stable borderline mediastinal and hilar lymph nodes. 5. No findings for metastatic disease involving the abdomen/pelvis or bony structures. Electronically Signed   By: Marijo Sanes M.D.   On: 02/20/2018 15:19   Ct Abdomen Pelvis W Contrast  Result Date: 02/20/2018 CLINICAL DATA:  CLINICAL DATA Restaging lung cancer. EXAM: CT CHEST, ABDOMEN, AND PELVIS WITH CONTRAST TECHNIQUE: Multidetector CT imaging of the chest, abdomen and pelvis was performed following the standard protocol during bolus administration of intravenous contrast. CONTRAST:  170m OMNIPAQUE IOHEXOL 300 MG/ML  SOLN COMPARISON:  12/14/2017 FINDINGS: CT CHEST FINDINGS Cardiovascular: The heart is normal in size. No pericardial effusion. Stable atherosclerotic calcifications involving the thoracic aorta but no dissection. The branch vessels are patent. No significant coronary artery calcifications. Mediastinum/Nodes: Stable mediastinal and hilar lymph nodes. Right paratracheal node on image number 22 measures 7.5 mm. 10 mm right hilar node on image number 27. 11 mm subcarinal lymph node on image number 31. 7.5 mm left infrahilar node on image number 30. Lungs/Pleura: Persistent ill-defined ground-glass opacity in the right upper lobe extending back to the major fissure. The more solid peripheral portion appears improved since the prior CT scan. This could represent treated tumor with interstitial spread or possibly surrounding radiation changes. Stable vague ground-glass opacity and extensive tree-in-bud pattern in the lower lobes bilaterally. This is more likely chronic inflammation or atypical infection such as  MAC. Stable right middle lobe bronchiectasis. No new pulmonary lesions or evidence of metastatic pulmonary nodules. There is a very small right pleural effusion. Stable underlying emphysematous changes. Musculoskeletal: No breast masses, chest wall masses, supraclavicular or axillary adenopathy. No significant bony findings. CT ABDOMEN PELVIS FINDINGS Hepatobiliary: No focal hepatic lesions or intrahepatic biliary dilatation. The gallbladder is normal. No common  bile duct dilatation. Pancreas: No mass, inflammation or ductal dilatation. Spleen: Normal size.  No focal lesions. Adrenals/Urinary Tract: The adrenal glands and kidneys are unremarkable and stable. The bladder is unremarkable. Stomach/Bowel: The stomach, duodenum, small bowel and colon are unremarkable. No acute inflammatory changes, mass lesions or obstructive findings. The terminal ileum is normal. Vascular/Lymphatic: Moderate atherosclerotic calcifications involving the aorta but no aneurysm or dissection. The branch vessels are patent. The major venous structures are patent. No mesenteric or retroperitoneal mass or adenopathy. Small scattered lymph nodes are stable. Reproductive: The uterus and ovaries are surgically absent. Other: No pelvic mass or adenopathy. No free pelvic fluid collections. No inguinal mass or adenopathy. No abdominal wall hernia or subcutaneous lesions. Musculoskeletal: No significant bony findings. IMPRESSION: 1. Persistent right upper lobe findings with perhaps slight contraction or decreased size of the more solid-appearing component. 2. Persistent fairly extensive patchy ground-glass opacity and tree-in-bud appearance in the lower lung zones. This is most likely chronic inflammation or atypical infection such as MAC. 3. No new worrisome pulmonary lesions or evidence of pulmonary metastatic disease. 4. Stable borderline mediastinal and hilar lymph nodes. 5. No findings for metastatic disease involving the abdomen/pelvis or bony  structures. Electronically Signed   By: Marijo Sanes M.D.   On: 02/20/2018 15:19      Specialty Problems      Pulmonary Problems   OSA on CPAP    on C-pap      Dyspnea   Community acquired pneumonia of right upper lobe of lung ([place])   Acute on chronic respiratory failure with hypoxia (HCC)   Sinusitis   Adenocarcinoma of right lung, stage 4 ([place])    10/16/2017-CT chest chest-progressive solid consolidation part of the right upper lobe aggressive appearing bilateral lower lobe process likely interstitial small bilateral pulmonary emboli noted  08/09/2017-PET scan- borderline hypermetabolic right upper lobe consolidation and groundglass and hypometabolic patchy groundglass in both lower lobes       Cough   Bronchiectasis (HCC)      Allergies  Allergen Reactions  . Adhesive [Tape] Other (See Comments)    Irritates skin   . Codeine Other (See Comments)    Skin crawlinf feeling  . Lactose Intolerance (Gi) Diarrhea  . Monosodium Glutamate Diarrhea  . Sulfa Antibiotics Nausea And Vomiting    Immunization History  Administered Date(s) Administered  . Influenza Split 12/14/2016  . Influenza, High Dose Seasonal PF 02/20/2014  . Influenza,inj,Quad PF,6+ Mos 12/13/2014  . Influenza-Unspecified 01/25/2018  . Pneumococcal Polysaccharide-23 07/17/2013    Past Medical History:  Diagnosis Date  . Anxiety   . Diabetes mellitus without complication ([place])   . Dyslipidemia   . HTN (hypertension)   . Hyperlipemia   . Hypothyroid   . Hypothyroidism   . Lung cancer ([place]) 2000   Carcinoid, s/p RLL lobectomy  . Obesity   . Ovarian cancer ([place])   . Palpitations 2009 and 2006   Negative nuclear stress test   . Sleep apnea    on C-pap    Tobacco History: Social History   Tobacco Use  Smoking Status Former Smoker  . Packs/day: 0.75  . Years: 30.00  . Pack years: 22.50  . Types: Cigarettes  . Last attempt to quit: 08/18/1991  . Years since quitting: 26.5  Smokeless Tobacco  Never Used   Counseling given: Yes  Continue to not smoke  Outpatient Encounter Medications as of 03/12/2018  Medication Sig  . acetaminophen (TYLENOL) 500 MG tablet Take 500 mg by  mouth every 6 (six) hours as needed.  . benzonatate (TESSALON) 200 MG capsule Take 1 capsule (200 mg total) by mouth 3 (three) times daily as needed for cough.  . budesonide-formoterol (SYMBICORT) 160-4.5 MCG/ACT inhaler Inhale 2 puffs into the lungs 2 (two) times daily.  . cholecalciferol (VITAMIN D3) 25 MCG (1000 UT) tablet Take 1,000 Units by mouth daily.  . clorazepate (TRANXENE) 7.5 MG tablet Take 7.5 mg by mouth 2 (two) times daily as needed for anxiety.  . folic acid (FOLVITE) 1 MG tablet TAKE 1 TABLET BY MOUTH DAILY.  [name] guaiFENesin-codeine (CHERATUSSIN AC) 100-10 MG/5ML syrup Take 5 mLs by mouth every 6 (six) hours as needed for cough.  [name] HYDROcodone-acetaminophen (NORCO/VICODIN) 5-325 MG tablet Take 1-2 tablets by mouth every 4 (four) hours as needed.  [name] ipratropium-albuterol (DUONEB) 0.5-2.5 (3) MG/3ML SOLN INHALE THE CONTENTS OF 1 VIAL VIA NEBULIZER 3 TIMES A DAY.  [name] levothyroxine (SYNTHROID, LEVOTHROID) 175 MCG tablet Take 175 mcg by mouth daily before breakfast.  . lidocaine-prilocaine (EMLA) cream Apply 1 application topically as needed.  [name] losartan-hydrochlorothiazide (HYZAAR) 50-12.5 MG per tablet Take 1 tablet by mouth every morning.   . metoprolol succinate (TOPROL-XL) 50 MG 24 hr tablet Take 50 mg by mouth 2 (two) times daily.   . Multiple Vitamins-Minerals (VISION FORMULA 2 PO) Take by mouth. OTC "Vision pills for Macular degeneration"  . prochlorperazine (COMPAZINE) 10 MG tablet Take 1 tablet (10 mg total) by mouth every 6 (six) hours as needed for nausea or vomiting.  . rosuvastatin (CRESTOR) 10 MG tablet Take 10 mg by mouth every evening.   . valACYclovir (VALTREX) 500 MG tablet Take 500 mg by mouth daily.  [name] 20 MG TABS tablet TAKE 1 TABLET (20 MG TOTAL) BY MOUTH DAILY WITH SUPPER.    . [DISCONTINUED] potassium chloride SA (K-DUR,KLOR-CON) 20 MEQ tablet Take 1 tablet (20 mEq total) by mouth daily. (Patient not taking: Reported on 03/12/2018)  . [DISCONTINUED] XARELTO 20 MG TABS tablet TAKE 1 TABLET (20 MG TOTAL) BY MOUTH DAILY WITH SUPPER.   No facility-administered encounter medications on file as of 03/12/2018.      Review of Systems  Review of Systems  Constitutional: Positive for fatigue. Negative for chills, fever and unexpected weight change.  HENT: Positive for postnasal drip. Negative for congestion, ear pain, sinus pressure and sinus pain.   Respiratory: Positive for cough (yellow mucous ) and wheezing (laying flat). Negative for chest tightness and shortness of breath.   Cardiovascular: Negative for chest pain and palpitations.  Gastrointestinal: Negative for blood in stool, diarrhea, nausea and vomiting.  Musculoskeletal: Negative for arthralgias.  Skin: Negative for color change.  Allergic/Immunologic: Negative for environmental allergies and food allergies.  Neurological: Negative for dizziness, light-headedness and headaches.  Psychiatric/Behavioral: Negative for dysphoric mood. The patient is not nervous/anxious.   All other systems reviewed and are negative.    Physical Exam  BP 136/68 (BP Location: Left Arm, Cuff Size: Normal)   Pulse 86   Temp 98.3 F (36.8 C) (Oral)   Ht _0  (1.626 m)   Wt 179 lb (81.2 kg)   SpO2 95%   BMI 30.73 kg/m   Wt Readings from Last 5 Encounters:  03/12/18 179 lb (81.2 kg)  03/07/18 185 lb 4.8 oz (84.1 kg)  02/22/18 185 lb 4.8 oz (84.1 kg)  02/01/18 190 lb 6.4 oz (86.4 kg)  01/11/18 193 lb (87.5 kg)     Physical Exam  Constitutional: She is oriented to  person, place, and time and well-developed, well-nourished, and in no distress. No distress.  HENT:  Head: Normocephalic and atraumatic.  Right Ear: Hearing, tympanic membrane, external ear and ear canal normal.  Left Ear: Hearing, tympanic membrane,  external ear and ear canal normal.  Nose: Nose normal. Right sinus exhibits no maxillary sinus tenderness and no frontal sinus tenderness. Left sinus exhibits no maxillary sinus tenderness and no frontal sinus tenderness.  Mouth/Throat: Uvula is midline and oropharynx is clear and moist. No oropharyngeal exudate.  Eyes: Pupils are equal, round, and reactive to light.  Neck: Normal range of motion. Neck supple. No JVD present.  Cardiovascular: Normal rate, regular rhythm and normal heart sounds.  Pulmonary/Chest: Effort normal and breath sounds normal. No accessory muscle usage. No respiratory distress. She has no decreased breath sounds. She has no wheezes. She has no rhonchi. She has no rales.  Musculoskeletal: Normal range of motion.  Lymphadenopathy:    She has no cervical adenopathy.  Neurological: She is alert and oriented to person, place, and time. Gait normal.  Skin: Skin is warm and dry. She is not diaphoretic. No erythema.  Psychiatric: Mood, memory, affect and judgment normal.  Nursing note and vitals reviewed.    Lab Results:  CBC    Component Value Date/Time   WBC 6.3 02/22/2018 0915   WBC 15.2 (H) 08/17/2017 1155   RBC 2.91 (L) 02/22/2018 0915   HGB 9.9 (L) 02/22/2018 0915   HCT 31.6 (L) 02/22/2018 0915   PLT 300 02/22/2018 0915   MCV 108.6 (H) 02/22/2018 0915   MCH 34.0 02/22/2018 0915   MCHC 31.3 02/22/2018 0915   RDW 15.0 02/22/2018 0915   LYMPHSABS 1.5 02/22/2018 0915   MONOABS 0.8 02/22/2018 0915   EOSABS 0.2 02/22/2018 0915   BASOSABS 0.0 02/22/2018 0915    BMET    Component Value Date/Time   NA 149 (H) 02/22/2018 0915   K 3.7 02/22/2018 0915   CL 107 02/22/2018 0915   CO2 34 (H) 02/22/2018 0915   GLUCOSE 100 (H) 02/22/2018 0915   BUN 7 (L) 02/22/2018 0915   CREATININE 0.77 02/22/2018 0915   CALCIUM 9.2 02/22/2018 0915   GFRNONAA >60 02/22/2018 0915   GFRAA >60 02/22/2018 0915    BNP No results found for: BNP  ProBNP No results found for:  PROBNP    Assessment & Plan:   77 year old female patient completing follow-up with our office today.  Will work to get patient established with a new pulmonologist in our office.  We will get patient established with Dr. Valeta Harms.  Patient to continue follow-up with oncology.  Could consider pulmonary function test to further evaluate dyspnea when patient is on a stable interval.  Patient has previously normal spirometry.  Patient reports that she has had pulmonary function testing done before may be over 10 years ago.  I am unable to see these records.  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  May/2019 bronchoscopy AFB, fungal, culture were negative.  Can continue to monitor patient's symptoms if patient has flares, or symptoms worsen may consider repeating bronchoscopy in the future.  Follow-up with our office in 2 to 3 months.  Dyspnea Continue Symbicort 160 >>> 2 puffs in the morning right when you wake up, rinse out your mouth after use, 12 hours later 2 puffs, rinse after use >>> Take this daily, no matter what >>> This is not a rescue inhaler   Continue oxygen therapy as prescribed  >>>maintain oxygen saturations  greater than 88 percent  >>>if unable to maintain oxygen saturations please contact the office  >>>do not smoke with oxygen  >>>can use nasal saline gel or nasal saline rinses to moisturize nose if oxygen causes dryness  Follow up with Dr. Valeta Harms in 2-3 months for establishing care  >>>67mn office slot   Acute on chronic respiratory failure with hypoxia ([facility] [place]  Continue oxygen therapy as prescribed  >>>maintain oxygen saturations greater than 88 percent  >>>if unable to maintain oxygen saturations please contact the office  >>>do not smoke with oxygen  >>>can use nasal saline gel or nasal saline rinses to moisturize nose if oxygen causes dryness  Follow up with Dr. IValeta Harmsin 2-3 months for establishing care  >>>333m office slot   Bronchiectasis  ([facility]chie[facility]asis: This is the medical term which indicates that you have damage, dilated airways making you more susceptible to respiratory infection. Use a flutter valve 10 breaths twice a day or 4 to 5 breaths 4-5 times a day to help clear mucus out Let usKoreanow if you have cough with change in mucus color or fevers or chills.  At that point you would need an antibiotic. Maintain a healthy nutritious diet, eating whole foods Take your medications as prescribed   Patient and husband to look for flutter valve at home.  If unable to obtain they can contact our office and we can place a prescription for a flutter valve for the patient to receive.      BrLauraine RinneNP 03/12/2018   This appointment was 26 minutes along with over 50% of the time in direct face-to-face patient care, assessment, plan of care, and follow-up.

## 2018-03-12 ENCOUNTER — Encounter: Payer: Self-pay | Admitting: Pulmonary Disease

## 2018-03-12 ENCOUNTER — Ambulatory Visit (INDEPENDENT_AMBULATORY_CARE_PROVIDER_SITE_OTHER): Payer: PPO | Admitting: Pulmonary Disease

## 2018-03-12 DIAGNOSIS — J479 Bronchiectasis, uncomplicated: Secondary | ICD-10-CM

## 2018-03-12 DIAGNOSIS — R0600 Dyspnea, unspecified: Secondary | ICD-10-CM | POA: Diagnosis not present

## 2018-03-12 DIAGNOSIS — C3491 Malignant neoplasm of unspecified part of right bronchus or lung: Secondary | ICD-10-CM

## 2018-03-12 DIAGNOSIS — J9621 Acute and chronic respiratory failure with hypoxia: Secondary | ICD-10-CM

## 2018-03-12 NOTE — Assessment & Plan Note (Signed)
Bronchiectasis: This is the medical term which indicates that you have damage, dilated airways making you more susceptible to respiratory infection. Use a flutter valve 10 breaths twice a day or 4 to 5 breaths 4-5 times a day to help clear mucus out Let us know if you have cough with change in mucus color or fevers or chills.  At that point you would need an antibiotic. Maintain a healthy nutritious diet, eating whole foods Take your medications as prescribed   Patient and husband to look for flutter valve at home.  If unable to obtain they can contact our office and we can place a prescription for a flutter valve for the patient to receive.

## 2018-03-12 NOTE — Assessment & Plan Note (Signed)
Continue Symbicort 160 >>> 2 puffs in the morning right when you wake up, rinse out your mouth after use, 12 hours later 2 puffs, rinse after use >>> Take this daily, no matter what >>> This is not a rescue inhaler   Continue oxygen therapy as prescribed  >>>maintain oxygen saturations greater than 88 percent  >>>if unable to maintain oxygen saturations please contact the office  >>>do not smoke with oxygen  >>>can use nasal saline gel or nasal saline rinses to moisturize nose if oxygen causes dryness  Follow up with Dr. Valeta Harms in 2-3 months for establishing care  >>>51min office slot

## 2018-03-12 NOTE — Assessment & Plan Note (Signed)
  Continue oxygen therapy as prescribed  >>>maintain oxygen saturations greater than 88 percent  >>>if unable to maintain oxygen saturations please contact the office  >>>do not smoke with oxygen  >>>can use nasal saline gel or nasal saline rinses to moisturize nose if oxygen causes dryness  Follow up with Dr. Valeta Harms in 2-3 months for establishing care  >>>48min office slot

## 2018-03-12 NOTE — Patient Instructions (Addendum)
Continue Symbicort 160 >>> 2 puffs in the morning right when you wake up, rinse out your mouth after use, 12 hours later 2 puffs, rinse after use >>> Take this daily, no matter what >>> This is not a rescue inhaler   Continue oxygen therapy as prescribed  >>>maintain oxygen saturations greater than 88 percent  >>>if unable to maintain oxygen saturations please contact the office  >>>do not smoke with oxygen  >>>can use nasal saline gel or nasal saline rinses to moisturize nose if oxygen causes dryness  Bronchiectasis: This is the medical term which indicates that you have damage, dilated airways making you more susceptible to respiratory infection. Use a flutter valve 10 breaths twice a day or 4 to 5 breaths 4-5 times a day to help clear mucus out Let us know if you have cough with change in mucus color or fevers or chills.  At that point you would need an antibiotic. Maintain a healthy nutritious diet, eating whole foods Take your medications as prescribed   Continue follow up with Oncology   Follow up with Dr. Valeta Harms in 2-3 months for establishing care  >>>55min office slot   It is flu season:   >>>Remember to be washing your hands regularly, using hand sanitizer, be careful to use around herself with has contact with people who are sick will increase her chances of getting sick yourself. >>> Best ways to protect herself from the flu: Receive the yearly flu vaccine, practice good hand hygiene washing with soap and also using hand sanitizer when available, eat a nutritious meals, get adequate rest, hydrate appropriately   Please contact the office if your symptoms worsen or you have concerns that you are not improving.   Thank you for choosing [facility] for your healthcare, and for allowing [place] to partner with you on your healthcare journey. I am thankful to be able to provide care to you today.   Wyn Quaker FNP-C

## 2018-03-14 NOTE — Progress Notes (Signed)
Will be glad to see. Thanks Garner Nash, DO [facility] 03/14/2018 6:24 PM

## 2018-03-15 ENCOUNTER — Inpatient Hospital Stay: Payer: PPO

## 2018-03-15 ENCOUNTER — Encounter: Payer: Self-pay | Admitting: Internal Medicine

## 2018-03-15 ENCOUNTER — Inpatient Hospital Stay: Payer: PPO | Attending: Internal Medicine | Admitting: Internal Medicine

## 2018-03-15 VITALS — BP 125/42 | HR 94 | Temp 98.7°F | Resp 18 | Ht 64.0 in | Wt 180.3 lb

## 2018-03-15 DIAGNOSIS — Z8543 Personal history of malignant neoplasm of ovary: Secondary | ICD-10-CM | POA: Diagnosis not present

## 2018-03-15 DIAGNOSIS — Z86718 Personal history of other venous thrombosis and embolism: Secondary | ICD-10-CM | POA: Diagnosis not present

## 2018-03-15 DIAGNOSIS — I10 Essential (primary) hypertension: Secondary | ICD-10-CM | POA: Diagnosis not present

## 2018-03-15 DIAGNOSIS — C3491 Malignant neoplasm of unspecified part of right bronchus or lung: Secondary | ICD-10-CM

## 2018-03-15 DIAGNOSIS — Z9981 Dependence on supplemental oxygen: Secondary | ICD-10-CM | POA: Insufficient documentation

## 2018-03-15 DIAGNOSIS — J479 Bronchiectasis, uncomplicated: Secondary | ICD-10-CM

## 2018-03-15 DIAGNOSIS — G4730 Sleep apnea, unspecified: Secondary | ICD-10-CM | POA: Diagnosis not present

## 2018-03-15 DIAGNOSIS — F419 Anxiety disorder, unspecified: Secondary | ICD-10-CM | POA: Insufficient documentation

## 2018-03-15 DIAGNOSIS — E669 Obesity, unspecified: Secondary | ICD-10-CM | POA: Diagnosis not present

## 2018-03-15 DIAGNOSIS — Z5111 Encounter for antineoplastic chemotherapy: Secondary | ICD-10-CM | POA: Insufficient documentation

## 2018-03-15 DIAGNOSIS — I2699 Other pulmonary embolism without acute cor pulmonale: Secondary | ICD-10-CM

## 2018-03-15 DIAGNOSIS — C3481 Malignant neoplasm of overlapping sites of right bronchus and lung: Secondary | ICD-10-CM | POA: Insufficient documentation

## 2018-03-15 DIAGNOSIS — J449 Chronic obstructive pulmonary disease, unspecified: Secondary | ICD-10-CM | POA: Diagnosis not present

## 2018-03-15 DIAGNOSIS — Z79899 Other long term (current) drug therapy: Secondary | ICD-10-CM | POA: Insufficient documentation

## 2018-03-15 DIAGNOSIS — E785 Hyperlipidemia, unspecified: Secondary | ICD-10-CM | POA: Diagnosis not present

## 2018-03-15 DIAGNOSIS — E039 Hypothyroidism, unspecified: Secondary | ICD-10-CM | POA: Insufficient documentation

## 2018-03-15 DIAGNOSIS — E119 Type 2 diabetes mellitus without complications: Secondary | ICD-10-CM

## 2018-03-15 DIAGNOSIS — I700 Atherosclerosis of aorta: Secondary | ICD-10-CM | POA: Diagnosis not present

## 2018-03-15 DIAGNOSIS — Z7901 Long term (current) use of anticoagulants: Secondary | ICD-10-CM | POA: Diagnosis not present

## 2018-03-15 DIAGNOSIS — Z5112 Encounter for antineoplastic immunotherapy: Secondary | ICD-10-CM

## 2018-03-15 DIAGNOSIS — Z95828 Presence of other vascular implants and grafts: Secondary | ICD-10-CM

## 2018-03-15 LAB — CBC WITH DIFFERENTIAL (CANCER CENTER ONLY)
Abs Immature Granulocytes: 0.03 10*3/uL (ref 0.00–0.07)
Basophils Absolute: 0 10*3/uL (ref 0.0–0.1)
Basophils Relative: 0 %
Eosinophils Absolute: 0.1 10*3/uL (ref 0.0–0.5)
Eosinophils Relative: 2 %
HCT: 33 % — ABNORMAL LOW (ref 36.0–46.0)
Hemoglobin: 10 g/dL — ABNORMAL LOW (ref 12.0–15.0)
Immature Granulocytes: 0 %
Lymphocytes Relative: 18 %
Lymphs Abs: 1.3 10*3/uL (ref 0.7–4.0)
MCH: 33.2 pg (ref 26.0–34.0)
MCHC: 30.3 g/dL (ref 30.0–36.0)
MCV: 109.6 fL — ABNORMAL HIGH (ref 80.0–100.0)
Monocytes Absolute: 0.8 10*3/uL (ref 0.1–1.0)
Monocytes Relative: 11 %
Neutro Abs: 5.1 10*3/uL (ref 1.7–7.7)
Neutrophils Relative %: 69 %
Platelet Count: 333 10*3/uL (ref 150–400)
RBC: 3.01 MIL/uL — ABNORMAL LOW (ref 3.87–5.11)
RDW: 15.7 % — ABNORMAL HIGH (ref 11.5–15.5)
WBC Count: 7.4 10*3/uL (ref 4.0–10.5)
nRBC: 0 % (ref 0.0–0.2)

## 2018-03-15 LAB — CMP (CANCER CENTER ONLY)
ALT: 15 U/L (ref 0–44)
AST: 29 U/L (ref 15–41)
Albumin: 3.2 g/dL — ABNORMAL LOW (ref 3.5–5.0)
Alkaline Phosphatase: 57 U/L (ref 38–126)
Anion gap: 11 (ref 5–15)
BUN: 9 mg/dL (ref 8–23)
CO2: 30 mmol/L (ref 22–32)
Calcium: 9.4 mg/dL (ref 8.9–10.3)
Chloride: 105 mmol/L (ref 98–111)
Creatinine: 0.84 mg/dL (ref 0.44–1.00)
GFR, Estimated: >60 mL/min (ref >60)
Glucose, Bld: 143 mg/dL — ABNORMAL HIGH (ref 70–99)
POTASSIUM: 3.6 mmol/L (ref 3.5–5.1)
Sodium: 146 mmol/L — ABNORMAL HIGH (ref 135–145)
Total Bilirubin: 0.3 mg/dL (ref 0.3–1.2)
Total Protein: 7.2 g/dL (ref 6.5–8.1)

## 2018-03-15 LAB — TSH: TSH: 0.915 u[IU]/mL (ref 0.308–3.960)

## 2018-03-15 MED ORDER — SODIUM CHLORIDE 0.9% FLUSH
10.0000 mL | INTRAVENOUS | Status: DC | PRN
  Administered 2018-03-15: 10 mL
  Filled 2018-03-15: qty 10

## 2018-03-15 MED ORDER — ONDANSETRON HCL 4 MG/2ML IJ SOLN
8.0000 mg | Freq: Once | INTRAMUSCULAR | Status: AC
  Administered 2018-03-15: 8 mg via INTRAVENOUS

## 2018-03-15 MED ORDER — DEXAMETHASONE SODIUM PHOSPHATE 10 MG/ML IJ SOLN
10.0000 mg | Freq: Once | INTRAMUSCULAR | Status: AC
  Administered 2018-03-15: 10 mg via INTRAVENOUS

## 2018-03-15 MED ORDER — SODIUM CHLORIDE 0.9 % IV SOLN
Freq: Once | INTRAVENOUS | Status: AC
  Administered 2018-03-15: 11:00:00 via INTRAVENOUS
  Filled 2018-03-15: qty 250

## 2018-03-15 MED ORDER — HYDROCODONE-ACETAMINOPHEN 5-325 MG PO TABS: 1.0000 | ORAL_TABLET | ORAL | 0 refills | Status: DC | PRN

## 2018-03-15 MED ORDER — SODIUM CHLORIDE 0.9 % IV SOLN
200.0000 mg | Freq: Once | INTRAVENOUS | Status: AC
  Administered 2018-03-15: 200 mg via INTRAVENOUS
  Filled 2018-03-15: qty 8

## 2018-03-15 MED ORDER — SODIUM CHLORIDE 0.9 % IV SOLN
485.0000 mg/m2 | Freq: Once | INTRAVENOUS | Status: AC
  Administered 2018-03-15: 1000 mg via INTRAVENOUS
  Filled 2018-03-15: qty 40

## 2018-03-15 MED ORDER — HEPARIN SOD (PORK) LOCK FLUSH 100 UNIT/ML IV SOLN
500.0000 [IU] | Freq: Once | INTRAVENOUS | Status: AC | PRN
  Administered 2018-03-15: 500 [IU]
  Filled 2018-03-15: qty 5

## 2018-03-15 MED ORDER — ONDANSETRON HCL 4 MG/2ML IJ SOLN
INTRAMUSCULAR | Status: AC
  Filled 2018-03-15: qty 2

## 2018-03-15 MED ORDER — DEXAMETHASONE SODIUM PHOSPHATE 10 MG/ML IJ SOLN
INTRAMUSCULAR | Status: AC
  Filled 2018-03-15: qty 1

## 2018-03-15 NOTE — Progress Notes (Signed)
[facility] [phone]   [phone]  OFFICE PROGRESS NOTE  [name], MD [place] [address]  DIAGNOSIS:  1) Stage IV (T3, N0, M1 a)non-small cell lung cancer, well-differentiated adenocarcinoma presented with multifocal disease involving mainly the right upper lobe as well as the right middle lobe and left lower lobe of the lung diagnosed in May 2019. 2) bilateral small pulmonary emboli diagnosed incidentally on CT scan of the chest on October 16, 2017  Guardant 360 testing was negative for actionable mutations.  PRIOR THERAPY:None  CURRENT THERAPY: 1) Carboplatin for an AUC of 5, Alimta 500 mg/m2 and Keytruda 200 mg IV given every 3 weeks. First dosegiven on 08/18/2017.Status post 10 cycles.  Starting from cycle #5 the patient is on maintenance Alimta and Ketruda (pembrolizumab) every 3 weeks. 2) Xarelto 15 mg p.o. twice daily for the first 3 weeks followed by 20 mg p.o. Daily.  INTERVAL HISTORY: Erin Myers 78 y.o. female returns to the clinic today for follow-up visit accompanied by her husband.  The patient is feeling fine today with no concerning complaints except for knee and ankle pain.  She was seen recently at the emergency department for this problem and she was given prescription for hydrocodone and felt a little bit better.  She denied having any current chest pain but continues to have shortness of breath at baseline increased with exertion and she is currently on home oxygen.  She has no cough or hemoptysis.  She denied having any fever or chills.  She has no nausea, vomiting, diarrhea or constipation.  She continues to tolerate her treatment with maintenance Alimta and Keytruda fairly well.  She is here today for evaluation before starting cycle #11.   MEDICAL HISTORY: Past Medical History:  Diagnosis Date  . Anxiety   . Diabetes mellitus without complication ([place])   . Dyslipidemia   . HTN  (hypertension)   . Hyperlipemia   . Hypothyroid   . Hypothyroidism   . Lung cancer ([place]) 2000   Carcinoid, s/p RLL lobectomy  . Obesity   . Ovarian cancer ([place])   . Palpitations 2009 and 2006   Negative nuclear stress test   . Sleep apnea    on C-pap    ALLERGIES:  is allergic to adhesive [tape]; codeine; lactose intolerance (gi); monosodium glutamate; and sulfa antibiotics.  MEDICATIONS:  Current Outpatient Medications  Medication Sig Dispense Refill  . acetaminophen (TYLENOL) 500 MG tablet Take 500 mg by mouth every 6 (six) hours as needed.    . benzonatate (TESSALON) 200 MG capsule Take 1 capsule (200 mg total) by mouth 3 (three) times daily as needed for cough. 30 capsule 1  . budesonide-formoterol (SYMBICORT) 160-4.5 MCG/ACT inhaler Inhale 2 puffs into the lungs 2 (two) times daily. 1 Inhaler 6  . cholecalciferol (VITAMIN D3) 25 MCG (1000 UT) tablet Take 1,000 Units by mouth daily.    . clorazepate (TRANXENE) 7.5 MG tablet Take 7.5 mg by mouth 2 (two) times daily as needed for anxiety.    . folic acid (FOLVITE) 1 MG tablet TAKE 1 TABLET BY MOUTH DAILY. 30 tablet 2  . guaiFENesin-codeine (CHERATUSSIN AC) 100-10 MG/5ML syrup Take 5 mLs by mouth every 6 (six) hours as needed for cough. 240 mL 0  . ipratropium-albuterol (DUONEB) 0.5-2.5 (3) MG/3ML SOLN INHALE THE CONTENTS OF 1 VIAL VIA NEBULIZER 3 TIMES A DAY. 360 mL 2  . levothyroxine (SYNTHROID, LEVOTHROID) 175 MCG tablet Take  175 mcg by mouth daily before breakfast.    . losartan-hydrochlorothiazide (HYZAAR) 50-12.5 MG per tablet Take 1 tablet by mouth every morning.     . metoprolol succinate (TOPROL-XL) 50 MG 24 hr tablet Take 50 mg by mouth 2 (two) times daily.     . Multiple Vitamins-Minerals (VISION FORMULA 2 PO) Take by mouth. OTC "Vision pills for Macular degeneration"    . rosuvastatin (CRESTOR) 10 MG tablet Take 10 mg by mouth every evening.     . valACYclovir (VALTREX) 500 MG tablet Take 500 mg by mouth daily.    [name] 20 MG TABS tablet TAKE 1 TABLET (20 MG TOTAL) BY MOUTH DAILY WITH SUPPER. 30 tablet 1  . HYDROcodone-acetaminophen (NORCO/VICODIN) 5-325 MG tablet Take 1-2 tablets by mouth every 4 (four) hours as needed. (Patient not taking: Reported on 03/15/2018) 15 tablet 0  . lidocaine-prilocaine (EMLA) cream Apply 1 application topically as needed. (Patient not taking: Reported on 03/15/2018) 30 g 0  . prochlorperazine (COMPAZINE) 10 MG tablet Take 1 tablet (10 mg total) by mouth every 6 (six) hours as needed for nausea or vomiting. (Patient not taking: Reported on 03/15/2018) 30 tablet 0   No current facility-administered medications for this visit.     SURGICAL HISTORY:  Past Surgical History:  Procedure Laterality Date  . ABDOMINAL HYSTERECTOMY  2001  . GRADED EXERCISE TOLERANCE TEST  08/2015    Blood pressure demonstrated a hypertensive response to exercise.  There was less than 0.42m of horizontal ST segment depression in the inferolateral leads. No diagnostic criterial for ischemia. Moderately impaired exercise tolerance. The patient achieved 5.8 mets. This is a low risk study.  . IR FLUORO GUIDE PORT INSERTION RIGHT  08/17/2017  . IR [place] VASC ACCESS RIGHT  08/17/2017  . LOBECTOMY  2000   RLL  . NM MYOCAR PERF WALL MOTION  08/20/2007   EF 74%  EXERCISE CAPACITY 7 METS  . OOPHORECTOMY  2001  . TONSILLECTOMY    . VIDEO BRONCHOSCOPY Bilateral 07/20/2017   Procedure: VIDEO BRONCHOSCOPY WITH FLUORO;  Surgeon: BCollene Gobble MD;  [facility];  Service: Cardiopulmonary;  Laterality: Bilateral;    REVIEW OF SYSTEMS:  Constitutional: positive for fatigue Eyes: negative Ears, nose, mouth, throat, and face: negative Respiratory: positive for dyspnea on exertion Cardiovascular: negative Gastrointestinal: negative Genitourinary:negative Integument/breast: negative Hematologic/lymphatic: negative Musculoskeletal:positive for arthralgias Neurological: negative Behavioral/Psych:  negative Endocrine: negative Allergic/Immunologic: negative   PHYSICAL EXAMINATION: General appearance: alert, cooperative, fatigued and no distress Head: Normocephalic, without obvious abnormality, atraumatic Neck: no adenopathy, no JVD, supple, symmetrical, trachea midline and thyroid not enlarged, symmetric, no tenderness/mass/nodules Lymph nodes: Cervical, supraclavicular, and axillary nodes normal. Resp: clear to auscultation bilaterally Back: symmetric, no curvature. ROM normal. No CVA tenderness. Cardio: regular rate and rhythm, S1, S2 normal, no murmur, click, rub or gallop GI: soft, non-tender; bowel sounds normal; no masses,  no organomegaly Extremities: extremities normal, atraumatic, no cyanosis or edema Neurologic: Alert and oriented X 3, normal strength and tone. Normal symmetric reflexes. Normal coordination and gait  ECOG PERFORMANCE STATUS: 1 - Symptomatic but completely ambulatory  Blood pressure (!) 125/42, pulse 94, temperature 98.7 F (37.1 C), temperature source Oral, resp. rate 18, height _0  (1.626 m), weight 180 lb 4.8 oz (81.8 kg), SpO2 95 %.  LABORATORY DATA: Lab Results  Component Value Date   WBC 7.4 03/15/2018   HGB 10.0 (L) 03/15/2018   HCT 33.0 (L) 03/15/2018   MCV 109.6 (H) 03/15/2018  PLT 333 03/15/2018      Chemistry      Component Value Date/Time   NA 149 (H) 02/22/2018 0915   K 3.7 02/22/2018 0915   CL 107 02/22/2018 0915   CO2 34 (H) 02/22/2018 0915   BUN 7 (L) 02/22/2018 0915   CREATININE 0.77 02/22/2018 0915      Component Value Date/Time   CALCIUM 9.2 02/22/2018 0915   ALKPHOS 62 02/22/2018 0915   AST 22 02/22/2018 0915   ALT 8 02/22/2018 0915   BILITOT 0.3 02/22/2018 0915       RADIOGRAPHIC STUDIES: Ct Chest W Contrast  Result Date: 02/20/2018 CLINICAL DATA:  CLINICAL DATA Restaging lung cancer. EXAM: CT CHEST, ABDOMEN, AND PELVIS WITH CONTRAST TECHNIQUE: Multidetector CT imaging of the chest, abdomen and pelvis was  performed following the standard protocol during bolus administration of intravenous contrast. CONTRAST:  178m OMNIPAQUE IOHEXOL 300 MG/ML  SOLN COMPARISON:  12/14/2017 FINDINGS: CT CHEST FINDINGS Cardiovascular: The heart is normal in size. No pericardial effusion. Stable atherosclerotic calcifications involving the thoracic aorta but no dissection. The branch vessels are patent. No significant coronary artery calcifications. Mediastinum/Nodes: Stable mediastinal and hilar lymph nodes. Right paratracheal node on image number 22 measures 7.5 mm. 10 mm right hilar node on image number 27. 11 mm subcarinal lymph node on image number 31. 7.5 mm left infrahilar node on image number 30. Lungs/Pleura: Persistent ill-defined ground-glass opacity in the right upper lobe extending back to the major fissure. The more solid peripheral portion appears improved since the prior CT scan. This could represent treated tumor with interstitial spread or possibly surrounding radiation changes. Stable vague ground-glass opacity and extensive tree-in-bud pattern in the lower lobes bilaterally. This is more likely chronic inflammation or atypical infection such as MAC. Stable right middle lobe bronchiectasis. No new pulmonary lesions or evidence of metastatic pulmonary nodules. There is a very small right pleural effusion. Stable underlying emphysematous changes. Musculoskeletal: No breast masses, chest wall masses, supraclavicular or axillary adenopathy. No significant bony findings. CT ABDOMEN PELVIS FINDINGS Hepatobiliary: No focal hepatic lesions or intrahepatic biliary dilatation. The gallbladder is normal. No common bile duct dilatation. Pancreas: No mass, inflammation or ductal dilatation. Spleen: Normal size.  No focal lesions. Adrenals/Urinary Tract: The adrenal glands and kidneys are unremarkable and stable. The bladder is unremarkable. Stomach/Bowel: The stomach, duodenum, small bowel and colon are unremarkable. No acute  inflammatory changes, mass lesions or obstructive findings. The terminal ileum is normal. Vascular/Lymphatic: Moderate atherosclerotic calcifications involving the aorta but no aneurysm or dissection. The branch vessels are patent. The major venous structures are patent. No mesenteric or retroperitoneal mass or adenopathy. Small scattered lymph nodes are stable. Reproductive: The uterus and ovaries are surgically absent. Other: No pelvic mass or adenopathy. No free pelvic fluid collections. No inguinal mass or adenopathy. No abdominal wall hernia or subcutaneous lesions. Musculoskeletal: No significant bony findings. IMPRESSION: 1. Persistent right upper lobe findings with perhaps slight contraction or decreased size of the more solid-appearing component. 2. Persistent fairly extensive patchy ground-glass opacity and tree-in-bud appearance in the lower lung zones. This is most likely chronic inflammation or atypical infection such as MAC. 3. No new worrisome pulmonary lesions or evidence of pulmonary metastatic disease. 4. Stable borderline mediastinal and hilar lymph nodes. 5. No findings for metastatic disease involving the abdomen/pelvis or bony structures. Electronically Signed   By: PMarijo SanesM.D.   On: 02/20/2018 15:19   Ct Abdomen Pelvis W Contrast  Result Date: 02/20/2018 CLINICAL DATA:  CLINICAL DATA Restaging lung cancer. EXAM: CT CHEST, ABDOMEN, AND PELVIS WITH CONTRAST TECHNIQUE: Multidetector CT imaging of the chest, abdomen and pelvis was performed following the standard protocol during bolus administration of intravenous contrast. CONTRAST:  196m OMNIPAQUE IOHEXOL 300 MG/ML  SOLN COMPARISON:  12/14/2017 FINDINGS: CT CHEST FINDINGS Cardiovascular: The heart is normal in size. No pericardial effusion. Stable atherosclerotic calcifications involving the thoracic aorta but no dissection. The branch vessels are patent. No significant coronary artery calcifications. Mediastinum/Nodes: Stable  mediastinal and hilar lymph nodes. Right paratracheal node on image number 22 measures 7.5 mm. 10 mm right hilar node on image number 27. 11 mm subcarinal lymph node on image number 31. 7.5 mm left infrahilar node on image number 30. Lungs/Pleura: Persistent ill-defined ground-glass opacity in the right upper lobe extending back to the major fissure. The more solid peripheral portion appears improved since the prior CT scan. This could represent treated tumor with interstitial spread or possibly surrounding radiation changes. Stable vague ground-glass opacity and extensive tree-in-bud pattern in the lower lobes bilaterally. This is more likely chronic inflammation or atypical infection such as MAC. Stable right middle lobe bronchiectasis. No new pulmonary lesions or evidence of metastatic pulmonary nodules. There is a very small right pleural effusion. Stable underlying emphysematous changes. Musculoskeletal: No breast masses, chest wall masses, supraclavicular or axillary adenopathy. No significant bony findings. CT ABDOMEN PELVIS FINDINGS Hepatobiliary: No focal hepatic lesions or intrahepatic biliary dilatation. The gallbladder is normal. No common bile duct dilatation. Pancreas: No mass, inflammation or ductal dilatation. Spleen: Normal size.  No focal lesions. Adrenals/Urinary Tract: The adrenal glands and kidneys are unremarkable and stable. The bladder is unremarkable. Stomach/Bowel: The stomach, duodenum, small bowel and colon are unremarkable. No acute inflammatory changes, mass lesions or obstructive findings. The terminal ileum is normal. Vascular/Lymphatic: Moderate atherosclerotic calcifications involving the aorta but no aneurysm or dissection. The branch vessels are patent. The major venous structures are patent. No mesenteric or retroperitoneal mass or adenopathy. Small scattered lymph nodes are stable. Reproductive: The uterus and ovaries are surgically absent. Other: No pelvic mass or adenopathy.  No free pelvic fluid collections. No inguinal mass or adenopathy. No abdominal wall hernia or subcutaneous lesions. Musculoskeletal: No significant bony findings. IMPRESSION: 1. Persistent right upper lobe findings with perhaps slight contraction or decreased size of the more solid-appearing component. 2. Persistent fairly extensive patchy ground-glass opacity and tree-in-bud appearance in the lower lung zones. This is most likely chronic inflammation or atypical infection such as MAC. 3. No new worrisome pulmonary lesions or evidence of pulmonary metastatic disease. 4. Stable borderline mediastinal and hilar lymph nodes. 5. No findings for metastatic disease involving the abdomen/pelvis or bony structures. Electronically Signed   By: PMarijo SanesM.D.   On: 02/20/2018 15:19    ASSESSMENT AND PLAN: This is a very pleasant 78years old white female with stage IV non-small cell lung cancer, adenocarcinoma with no actionable mutations. She is currently undergoing systemic chemotherapy with carboplatin, Alimta and Ketruda (pembrolizumab) status post 10 cycles.  Starting from cycle #5 she is on maintenance treatment with Alimta and Ketruda (pembrolizumab). The patient has been tolerating her maintenance treatment well with no concerning adverse effects. I recommended for her to proceed with cycle #11 today as scheduled. 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 For the COPD, she will continue on home oxygen and she  has follow-up appointment with her pulmonologist in few weeks. She will come back for follow-up visit in 3 weeks for evaluation before the next cycle of her treatment. The patient was advised to call immediately if she has any concerning symptoms in the interval. The patient voices understanding of current disease status and treatment options and is in agreement with the current care  plan. All questions were answered. The patient knows to call the clinic with any problems, questions or concerns. We can certainly see the patient much sooner if necessary.  Disclaimer: This note was dictated with voice recognition software. Similar sounding words can inadvertently be transcribed and may not be corrected upon review.

## 2018-03-15 NOTE — Patient Instructions (Signed)
[facility] Discharge Instructions for Patients Receiving Chemotherapy  Today you received the following chemotherapy agents :  Keytruda,  Alimta.  To help prevent nausea and vomiting after your treatment, we encourage you to take your nausea medication as prescribed.   If you develop nausea and vomiting that is not controlled by your nausea medication, call the clinic.   BELOW ARE SYMPTOMS THAT SHOULD BE REPORTED IMMEDIATELY:  *FEVER GREATER THAN 100.5 F  *CHILLS WITH OR WITHOUT FEVER  NAUSEA AND VOMITING THAT IS NOT CONTROLLED WITH YOUR NAUSEA MEDICATION  *UNUSUAL SHORTNESS OF BREATH  *UNUSUAL BRUISING OR BLEEDING  TENDERNESS IN MOUTH AND THROAT WITH OR WITHOUT PRESENCE OF ULCERS  *URINARY PROBLEMS  *BOWEL PROBLEMS  UNUSUAL RASH Items with * indicate a potential emergency and should be followed up as soon as possible.  Feel free to call the clinic should you have any questions or concerns. The clinic phone number is [phone].  Please show the [facility] at check-in to the Emergency Department and triage nurse.

## 2018-04-05 ENCOUNTER — Inpatient Hospital Stay: Payer: PPO

## 2018-04-05 ENCOUNTER — Inpatient Hospital Stay (HOSPITAL_BASED_OUTPATIENT_CLINIC_OR_DEPARTMENT_OTHER): Payer: PPO | Admitting: Medical

## 2018-04-05 ENCOUNTER — Telehealth: Payer: Self-pay

## 2018-04-05 VITALS — BP 143/54 | HR 82 | Temp 98.1°F | Resp 16 | Ht 64.0 in | Wt 177.7 lb

## 2018-04-05 DIAGNOSIS — Z5112 Encounter for antineoplastic immunotherapy: Secondary | ICD-10-CM

## 2018-04-05 DIAGNOSIS — C3491 Malignant neoplasm of unspecified part of right bronchus or lung: Secondary | ICD-10-CM

## 2018-04-05 DIAGNOSIS — C3481 Malignant neoplasm of overlapping sites of right bronchus and lung: Secondary | ICD-10-CM

## 2018-04-05 DIAGNOSIS — Z5111 Encounter for antineoplastic chemotherapy: Secondary | ICD-10-CM | POA: Diagnosis not present

## 2018-04-05 LAB — CBC WITH DIFFERENTIAL (CANCER CENTER ONLY)
Abs Immature Granulocytes: 0.03 10*3/uL (ref 0.00–0.07)
Basophils Absolute: 0 10*3/uL (ref 0.0–0.1)
Basophils Relative: 0 %
Eosinophils Absolute: 0.2 10*3/uL (ref 0.0–0.5)
Eosinophils Relative: 3 %
HCT: 30.6 % — ABNORMAL LOW (ref 36.0–46.0)
Hemoglobin: 9.3 g/dL — ABNORMAL LOW (ref 12.0–15.0)
Immature Granulocytes: 0 %
Lymphocytes Relative: 19 %
Lymphs Abs: 1.3 10*3/uL (ref 0.7–4.0)
MCH: 33.8 pg (ref 26.0–34.0)
MCHC: 30.4 g/dL (ref 30.0–36.0)
MCV: 111.3 fL — ABNORMAL HIGH (ref 80.0–100.0)
Monocytes Absolute: 0.8 10*3/uL (ref 0.1–1.0)
Monocytes Relative: 12 %
NEUTROS PCT: 66 %
Neutro Abs: 4.5 10*3/uL (ref 1.7–7.7)
Platelet Count: 285 10*3/uL (ref 150–400)
RBC: 2.75 MIL/uL — ABNORMAL LOW (ref 3.87–5.11)
RDW: 16.4 % — ABNORMAL HIGH (ref 11.5–15.5)
WBC Count: 6.9 10*3/uL (ref 4.0–10.5)
nRBC: 0 % (ref 0.0–0.2)

## 2018-04-05 LAB — CMP (CANCER CENTER ONLY)
ALT: 15 U/L (ref 0–44)
ANION GAP: 10 (ref 5–15)
AST: 21 U/L (ref 15–41)
Albumin: 3.2 g/dL — ABNORMAL LOW (ref 3.5–5.0)
Alkaline Phosphatase: 59 U/L (ref 38–126)
BUN: 14 mg/dL (ref 8–23)
CO2: 31 mmol/L (ref 22–32)
Calcium: 9.2 mg/dL (ref 8.9–10.3)
Chloride: 105 mmol/L (ref 98–111)
Creatinine: 0.89 mg/dL (ref 0.44–1.00)
GFR, Est AFR Am: >60 mL/min (ref >60)
GFR, Estimated: >60 mL/min (ref >60)
Glucose, Bld: 153 mg/dL — ABNORMAL HIGH (ref 70–99)
Potassium: 3.8 mmol/L (ref 3.5–5.1)
Sodium: 146 mmol/L — ABNORMAL HIGH (ref 135–145)
Total Bilirubin: 0.2 mg/dL — ABNORMAL LOW (ref 0.3–1.2)
Total Protein: 6.6 g/dL (ref 6.5–8.1)

## 2018-04-05 LAB — TSH: TSH: 7.695 u[IU]/mL — ABNORMAL HIGH (ref 0.308–3.960)

## 2018-04-05 MED ORDER — SODIUM CHLORIDE 0.9 % IV SOLN
Freq: Once | INTRAVENOUS | Status: AC
  Administered 2018-04-05: 11:00:00 via INTRAVENOUS
  Filled 2018-04-05: qty 250

## 2018-04-05 MED ORDER — ONDANSETRON HCL 4 MG/2ML IJ SOLN
INTRAMUSCULAR | Status: AC
  Filled 2018-04-05: qty 4

## 2018-04-05 MED ORDER — DEXAMETHASONE SODIUM PHOSPHATE 10 MG/ML IJ SOLN
INTRAMUSCULAR | Status: AC
  Filled 2018-04-05: qty 1

## 2018-04-05 MED ORDER — CYANOCOBALAMIN 1000 MCG/ML IJ SOLN
1000.0000 ug | Freq: Once | INTRAMUSCULAR | Status: AC
  Administered 2018-04-05: 1000 ug via INTRAMUSCULAR

## 2018-04-05 MED ORDER — SODIUM CHLORIDE 0.9% FLUSH
10.0000 mL | INTRAVENOUS | Status: DC | PRN
  Administered 2018-04-05: 10 mL
  Filled 2018-04-05: qty 10

## 2018-04-05 MED ORDER — SODIUM CHLORIDE 0.9 % IV SOLN
490.0000 mg/m2 | Freq: Once | INTRAVENOUS | Status: AC
  Administered 2018-04-05: 1000 mg via INTRAVENOUS
  Filled 2018-04-05: qty 40

## 2018-04-05 MED ORDER — SODIUM CHLORIDE 0.9 % IV SOLN
200.0000 mg | Freq: Once | INTRAVENOUS | Status: AC
  Administered 2018-04-05: 200 mg via INTRAVENOUS
  Filled 2018-04-05: qty 8

## 2018-04-05 MED ORDER — HEPARIN SOD (PORK) LOCK FLUSH 100 UNIT/ML IV SOLN
500.0000 [IU] | Freq: Once | INTRAVENOUS | Status: AC | PRN
  Administered 2018-04-05: 500 [IU]
  Filled 2018-04-05: qty 5

## 2018-04-05 MED ORDER — DEXAMETHASONE SODIUM PHOSPHATE 10 MG/ML IJ SOLN
10.0000 mg | Freq: Once | INTRAMUSCULAR | Status: AC
  Administered 2018-04-05: 10 mg via INTRAVENOUS

## 2018-04-05 MED ORDER — CYANOCOBALAMIN 1000 MCG/ML IJ SOLN
INTRAMUSCULAR | Status: AC
  Filled 2018-04-05: qty 1

## 2018-04-05 MED ORDER — ONDANSETRON HCL 4 MG/2ML IJ SOLN
8.0000 mg | Freq: Once | INTRAMUSCULAR | Status: AC
  Administered 2018-04-05: 8 mg via INTRAVENOUS

## 2018-04-05 NOTE — Telephone Encounter (Signed)
Prinited avs and calender of upcoming appointment. Per 1/23 los gave contrast

## 2018-04-05 NOTE — Progress Notes (Signed)
[facility] Progress Note   Erin Myers [id number] [birth year] 78 y.o.  Erin Myers is managed by Dr. Fanny Bien. Erin Myers  Actively treated with chemotherapy/immunotherapy/hormonal therapy: yes  Current Therapy: Keytruda and Alimta  Last Treated: 03/15/2018 (cycle 11, day 1)  Assessment: Plan:    Adenocarcinoma of right lung, stage 4 ([place]) - Plan: CT Abdomen Pelvis W Contrast, CT Chest W Contrast   Stage IV non-small cell lung cancer: Mr. Erin Myers presents for cycle 12, day 1 of Keytruda and Alimta.  We will proceed with her treatment today.  She will return to see Dr. [name] in follow-up on 04/26/2018 with restaging CT scans completed prior to her return.  Please see After Visit Summary for patient specific instructions.  Future Appointments  Date Time Provider [place]  04/26/2018  9:45 AM [facility] LAB 4 [facility] None  04/26/2018 10:00 AM [facility] [place] None  04/26/2018 10:30 AM Curt Bears, MD [facility] None  04/26/2018 11:30 AM [facility] INFUSION [facility] None  05/16/2018  9:15 AM [facility] LAB 1 [facility] None  05/16/2018  9:30 AM [facility] [place] FLUSH [facility] None  05/16/2018 10:00 AM Curt Bears, MD [facility] None  05/16/2018 11:00 AM [facility] INFUSION [facility] None  06/07/2018  9:45 AM [facility] ONLY [facility] None  06/07/2018 10:00 AM [facility] [place] FLUSH [facility] None  06/07/2018 10:30 AM Curt Bears, MD [facility] None  06/07/2018 11:15 AM [facility] INFUSION [facility] None  06/13/2018  2:15 PM Icard, Leory Plowman L, DO [facility] None    Orders Placed This Encounter  Procedures  . CT Abdomen Pelvis W Contrast  . CT Chest W Contrast       Subjective:   Patient ID:  Erin Myers is a 78 y.o. (DOB February 21, 1941) female.  Chief Complaint: No chief complaint on file.   HPI Erin Myers is a 78 year old female with a history of a stage IV non-small cell lung  cancer who is managed by Dr. [name].  She is status post cycle 11, day 1 of Keytruda and Alimta which was dosed on 03/15/2018.  She presents to the clinic today for consideration of cycle 12, day 1 of therapy.  She continues to receive vitamin B12 injections.  She was seen in the emergency room on 03/07/2018 for left lower extremity pain.  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  Her cough is better.  She reports having pain in her bilateral upper abdomen which she believes could be associated with her history of coughing.  She is otherwise doing well with no acute issues of concern.  She denies fevers, chills, sweats, nausea, vomiting, diarrhea, or constipation.  Medications: I have reviewed the patient's current medications.  Allergies:  Allergies  Allergen Reactions  . Adhesive [Tape] Other (See Comments)    Irritates skin   . Codeine Other (See Comments)    Skin crawlinf feeling  . Lactose Intolerance (Gi) Diarrhea  . Monosodium Glutamate Diarrhea  . Sulfa Antibiotics Nausea And Vomiting    Past Medical History:  Diagnosis Date  . Anxiety   . Diabetes mellitus without complication ([place])   . Dyslipidemia   . HTN (hypertension)   . Hyperlipemia   . Hypothyroid   . Hypothyroidism   . Lung cancer ([place]) 2000   Carcinoid, s/p RLL lobectomy  . Obesity   . Ovarian cancer ([place])   . Palpitations 2009 and 2006   Negative nuclear stress test   .  Sleep apnea    on C-pap    Past Surgical History:  Procedure Laterality Date  . ABDOMINAL HYSTERECTOMY  2001  . GRADED EXERCISE TOLERANCE TEST  08/2015    Blood pressure demonstrated a hypertensive response to exercise.  There was less than 0.47mm of horizontal ST segment depression in the inferolateral leads. No diagnostic criterial for ischemia. Moderately impaired exercise tolerance. The patient achieved 5.8 mets. This is a low risk study.  . IR FLUORO GUIDE PORT INSERTION RIGHT  08/17/2017  . IR  US GUIDE VASC ACCESS RIGHT  08/17/2017  . LOBECTOMY  2000   RLL  . NM MYOCAR PERF WALL MOTION  08/20/2007   EF 74%  EXERCISE CAPACITY 7 METS  . OOPHORECTOMY  2001  . TONSILLECTOMY    . VIDEO BRONCHOSCOPY Bilateral 07/20/2017   Procedure: VIDEO BRONCHOSCOPY WITH FLUORO;  Surgeon: Collene Gobble, MD;  Location: [facility];  Service: Cardiopulmonary;  Laterality: Bilateral;    Family History  Problem Relation Age of Onset  . Coronary artery disease Mother 80       died of an MI  . Heart attack Mother   . Suicidality Sister   . CVA Brother 32  . CVA Father     Social History   Socioeconomic History  . Marital status: Married    Spouse name: Not on file  . Number of children: 2  . Years of education: HS  . Highest education level: Not on file  Occupational History  . Not on file  Social Needs  . Financial resource strain: Not on file  . Food insecurity:    Worry: Not on file    Inability: Not on file  . Transportation needs:    Medical: Not on file    Non-medical: Not on file  Tobacco Use  . Smoking status: Former Smoker    Packs/day: 0.75    Years: 30.00    Pack years: 22.50    Types: Cigarettes    Last attempt to quit: 08/18/1991    Years since quitting: 26.6  . Smokeless tobacco: Never Used  Substance and Sexual Activity  . Alcohol use: No    Alcohol/week: 0.0 standard drinks  . Drug use: No  . Sexual activity: Yes    Partners: Male  Lifestyle  . Physical activity:    Days per week: Not on file    Minutes per session: Not on file  . Stress: Not on file  Relationships  . Social connections:    Talks on phone: Not on file    Gets together: Not on file    Attends religious service: Not on file    Active member of club or organization: Not on file    Attends meetings of clubs or organizations: Not on file    Relationship status: Not on file  . Intimate partner violence:    Fear of current or ex partner: Not on file    Emotionally abused: Not on file     Physically abused: Not on file    Forced sexual activity: Not on file  Other Topics Concern  . Not on file  Social History Narrative   1-2 glasses of tea a day    Married mother of 2 (daughter close to 32 and son 45-62 years old).  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    Past Medical History, Surgical history, Social history, and Family history were reviewed  and updated as appropriate.   Please see review of systems for further details on the patient's review from today.   Review of Systems:  Review of Systems  Constitutional: Negative for chills, diaphoresis and fever.  HENT: Negative for trouble swallowing and voice change.   Respiratory: Positive for cough. Negative for choking, chest tightness, shortness of breath, wheezing and stridor.   Cardiovascular: Negative for chest pain and palpitations.  Gastrointestinal: Negative for abdominal pain, constipation, diarrhea, nausea and vomiting.  Musculoskeletal: Positive for arthralgias. Negative for back pain and myalgias.  Neurological: Negative for dizziness, light-headedness and headaches.    Objective:   Physical Exam:  BP (!) 143/54 (BP Location: Left Arm, Patient Position: Sitting) Comment: Notified Nurse of BP  Pulse 82   Temp 98.1 F (36.7 C) (Oral)   Resp 16   Ht 5\' 4"  (1.626 m)   Wt 177 lb 11.2 oz (80.6 kg)   SpO2 94%   BMI 30.50 kg/m  ECOG: 1  Physical Exam Constitutional:      General: She is not in acute distress.    Appearance: She is not diaphoretic.  HENT:     Head: Normocephalic and atraumatic.  Cardiovascular:     Rate and Rhythm: Normal rate and regular rhythm.     Heart sounds: Normal heart sounds. No murmur. No friction rub. No gallop.   Pulmonary:     Effort: Pulmonary effort is normal. No respiratory distress.     Breath sounds: Normal breath sounds. No wheezing or rales.  Abdominal:     General: Bowel sounds are normal. There  is no distension.     Tenderness: There is no abdominal tenderness. There is no guarding.  Skin:    General: Skin is warm and dry.     Findings: No erythema or rash.  Neurological:     Mental Status: She is alert.  Psychiatric:        Mood and Affect: Mood normal.        Behavior: Behavior normal.        Thought Content: Thought content normal.        Judgment: Judgment normal.     Lab Review:     Component Value Date/Time   NA 146 (H) 04/05/2018 0932   K 3.8 04/05/2018 0932   CL 105 04/05/2018 0932   CO2 31 04/05/2018 0932   GLUCOSE 153 (H) 04/05/2018 0932   BUN 14 04/05/2018 0932   CREATININE 0.89 04/05/2018 0932   CALCIUM 9.2 04/05/2018 0932   PROT 6.6 04/05/2018 0932   ALBUMIN 3.2 (L) 04/05/2018 0932   AST 21 04/05/2018 0932   ALT 15 04/05/2018 0932   ALKPHOS 59 04/05/2018 0932   BILITOT 0.2 (L) 04/05/2018 0932   GFRNONAA >60 04/05/2018 0932   GFRAA >60 04/05/2018 0932       Component Value Date/Time   WBC 6.9 04/05/2018 0932   WBC 15.2 (H) 08/17/2017 1155   RBC 2.75 (L) 04/05/2018 0932   HGB 9.3 (L) 04/05/2018 0932   HCT 30.6 (L) 04/05/2018 0932   PLT 285 04/05/2018 0932   MCV 111.3 (H) 04/05/2018 0932   MCH 33.8 04/05/2018 0932   MCHC 30.4 04/05/2018 0932   RDW 16.4 (H) 04/05/2018 0932   LYMPHSABS 1.3 04/05/2018 0932   MONOABS 0.8 04/05/2018 0932   EOSABS 0.2 04/05/2018 0932   BASOSABS 0.0 04/05/2018 0932   -------------------------------  Imaging from last 24 hours (if applicable):  Radiology interpretation: No results found.   This  case was discussed with Dr. [name]. He expressed agreement with my management of this patient.     OK to treat. Sandi Mealy, PA-C

## 2018-04-05 NOTE — Patient Instructions (Signed)
[facility] Discharge Instructions for Patients Receiving Chemotherapy  Today you received the following chemotherapy agents :  Keytruda,  Alimta.  To help prevent nausea and vomiting after your treatment, we encourage you to take your nausea medication as prescribed.   If you develop nausea and vomiting that is not controlled by your nausea medication, call the clinic.   BELOW ARE SYMPTOMS THAT SHOULD BE REPORTED IMMEDIATELY:  *FEVER GREATER THAN 100.5 F  *CHILLS WITH OR WITHOUT FEVER  NAUSEA AND VOMITING THAT IS NOT CONTROLLED WITH YOUR NAUSEA MEDICATION  *UNUSUAL SHORTNESS OF BREATH  *UNUSUAL BRUISING OR BLEEDING  TENDERNESS IN MOUTH AND THROAT WITH OR WITHOUT PRESENCE OF ULCERS  *URINARY PROBLEMS  *BOWEL PROBLEMS  UNUSUAL RASH Items with * indicate a potential emergency and should be followed up as soon as possible.  Feel free to call the clinic should you have any questions or concerns. The clinic phone number is [phone].  Please show the [facility] at check-in to the Emergency Department and triage nurse.

## 2018-04-05 NOTE — Progress Notes (Signed)
Pt seen by PA Van only, no RN assessment at this time.  PA aware. 

## 2018-04-08 DIAGNOSIS — J449 Chronic obstructive pulmonary disease, unspecified: Secondary | ICD-10-CM | POA: Diagnosis not present

## 2018-04-12 DIAGNOSIS — R509 Fever, unspecified: Secondary | ICD-10-CM | POA: Diagnosis not present

## 2018-04-12 DIAGNOSIS — R1013 Epigastric pain: Secondary | ICD-10-CM | POA: Diagnosis not present

## 2018-04-12 DIAGNOSIS — Z6830 Body mass index (BMI) 30.0-30.9, adult: Secondary | ICD-10-CM | POA: Diagnosis not present

## 2018-04-12 DIAGNOSIS — R05 Cough: Secondary | ICD-10-CM | POA: Diagnosis not present

## 2018-04-12 DIAGNOSIS — M545 Low back pain: Secondary | ICD-10-CM | POA: Diagnosis not present

## 2018-04-12 DIAGNOSIS — I10 Essential (primary) hypertension: Secondary | ICD-10-CM | POA: Diagnosis not present

## 2018-04-12 DIAGNOSIS — E038 Other specified hypothyroidism: Secondary | ICD-10-CM | POA: Diagnosis not present

## 2018-04-12 DIAGNOSIS — E7849 Other hyperlipidemia: Secondary | ICD-10-CM | POA: Diagnosis not present

## 2018-04-12 DIAGNOSIS — C3431 Malignant neoplasm of lower lobe, right bronchus or lung: Secondary | ICD-10-CM | POA: Diagnosis not present

## 2018-04-12 DIAGNOSIS — Z1331 Encounter for screening for depression: Secondary | ICD-10-CM | POA: Diagnosis not present

## 2018-04-12 DIAGNOSIS — G4733 Obstructive sleep apnea (adult) (pediatric): Secondary | ICD-10-CM | POA: Diagnosis not present

## 2018-04-13 ENCOUNTER — Other Ambulatory Visit: Payer: Self-pay | Admitting: Pulmonary Disease

## 2018-04-19 DIAGNOSIS — G4733 Obstructive sleep apnea (adult) (pediatric): Secondary | ICD-10-CM | POA: Diagnosis not present

## 2018-04-23 ENCOUNTER — Ambulatory Visit (HOSPITAL_COMMUNITY)
Admission: RE | Admit: 2018-04-23 | Discharge: 2018-04-23 | Disposition: A | Discharge status: Home / Self Care | Payer: PPO | Source: Ambulatory Visit | Attending: Medical | Admitting: Medical

## 2018-04-23 DIAGNOSIS — C3490 Malignant neoplasm of unspecified part of unspecified bronchus or lung: Secondary | ICD-10-CM | POA: Diagnosis not present

## 2018-04-23 DIAGNOSIS — C3491 Malignant neoplasm of unspecified part of right bronchus or lung: Secondary | ICD-10-CM | POA: Insufficient documentation

## 2018-04-23 MED ORDER — IOHEXOL 300 MG/ML  SOLN
100.0000 mL | Freq: Once | INTRAMUSCULAR | Status: AC | PRN
  Administered 2018-04-23: 100 mL via INTRAVENOUS

## 2018-04-23 MED ORDER — SODIUM CHLORIDE (PF) 0.9 % IJ SOLN
INTRAMUSCULAR | Status: AC
  Filled 2018-04-23: qty 50

## 2018-04-26 ENCOUNTER — Inpatient Hospital Stay: Payer: PPO

## 2018-04-26 ENCOUNTER — Encounter: Payer: Self-pay | Admitting: Internal Medicine

## 2018-04-26 ENCOUNTER — Telehealth: Payer: Self-pay | Admitting: Internal Medicine

## 2018-04-26 ENCOUNTER — Inpatient Hospital Stay: Payer: PPO | Attending: Internal Medicine

## 2018-04-26 ENCOUNTER — Inpatient Hospital Stay (HOSPITAL_BASED_OUTPATIENT_CLINIC_OR_DEPARTMENT_OTHER): Payer: PPO | Admitting: Internal Medicine

## 2018-04-26 ENCOUNTER — Other Ambulatory Visit: Payer: Self-pay

## 2018-04-26 VITALS — BP 114/56 | HR 84 | Temp 98.5°F | Resp 18 | Ht 64.0 in | Wt 172.6 lb

## 2018-04-26 DIAGNOSIS — J449 Chronic obstructive pulmonary disease, unspecified: Secondary | ICD-10-CM | POA: Insufficient documentation

## 2018-04-26 DIAGNOSIS — I700 Atherosclerosis of aorta: Secondary | ICD-10-CM | POA: Insufficient documentation

## 2018-04-26 DIAGNOSIS — I10 Essential (primary) hypertension: Secondary | ICD-10-CM

## 2018-04-26 DIAGNOSIS — E785 Hyperlipidemia, unspecified: Secondary | ICD-10-CM

## 2018-04-26 DIAGNOSIS — Z5111 Encounter for antineoplastic chemotherapy: Secondary | ICD-10-CM | POA: Insufficient documentation

## 2018-04-26 DIAGNOSIS — Z86711 Personal history of pulmonary embolism: Secondary | ICD-10-CM | POA: Insufficient documentation

## 2018-04-26 DIAGNOSIS — F419 Anxiety disorder, unspecified: Secondary | ICD-10-CM

## 2018-04-26 DIAGNOSIS — E039 Hypothyroidism, unspecified: Secondary | ICD-10-CM | POA: Insufficient documentation

## 2018-04-26 DIAGNOSIS — E119 Type 2 diabetes mellitus without complications: Secondary | ICD-10-CM

## 2018-04-26 DIAGNOSIS — R109 Unspecified abdominal pain: Secondary | ICD-10-CM | POA: Diagnosis not present

## 2018-04-26 DIAGNOSIS — J90 Pleural effusion, not elsewhere classified: Secondary | ICD-10-CM | POA: Insufficient documentation

## 2018-04-26 DIAGNOSIS — G4730 Sleep apnea, unspecified: Secondary | ICD-10-CM

## 2018-04-26 DIAGNOSIS — Z79899 Other long term (current) drug therapy: Secondary | ICD-10-CM | POA: Diagnosis not present

## 2018-04-26 DIAGNOSIS — Z5112 Encounter for antineoplastic immunotherapy: Secondary | ICD-10-CM

## 2018-04-26 DIAGNOSIS — Z7901 Long term (current) use of anticoagulants: Secondary | ICD-10-CM | POA: Insufficient documentation

## 2018-04-26 DIAGNOSIS — R5383 Other fatigue: Secondary | ICD-10-CM | POA: Insufficient documentation

## 2018-04-26 DIAGNOSIS — C3481 Malignant neoplasm of overlapping sites of right bronchus and lung: Secondary | ICD-10-CM | POA: Diagnosis not present

## 2018-04-26 DIAGNOSIS — Z8543 Personal history of malignant neoplasm of ovary: Secondary | ICD-10-CM

## 2018-04-26 DIAGNOSIS — I2699 Other pulmonary embolism without acute cor pulmonale: Secondary | ICD-10-CM

## 2018-04-26 DIAGNOSIS — C3491 Malignant neoplasm of unspecified part of right bronchus or lung: Secondary | ICD-10-CM

## 2018-04-26 DIAGNOSIS — Z95828 Presence of other vascular implants and grafts: Secondary | ICD-10-CM

## 2018-04-26 LAB — CMP (CANCER CENTER ONLY)
ALT: 11 U/L (ref 0–44)
AST: 19 U/L (ref 15–41)
Albumin: 3.1 g/dL — ABNORMAL LOW (ref 3.5–5.0)
Alkaline Phosphatase: 58 U/L (ref 38–126)
Anion gap: 10 (ref 5–15)
BUN: 12 mg/dL (ref 8–23)
CO2: 30 mmol/L (ref 22–32)
Calcium: 9.3 mg/dL (ref 8.9–10.3)
Chloride: 106 mmol/L (ref 98–111)
Creatinine: 0.95 mg/dL (ref 0.44–1.00)
GFR, Est AFR Am: >60 mL/min (ref >60)
GFR, Estimated: 59 mL/min — ABNORMAL LOW (ref >60)
Glucose, Bld: 111 mg/dL — ABNORMAL HIGH (ref 70–99)
POTASSIUM: 3.6 mmol/L (ref 3.5–5.1)
Sodium: 146 mmol/L — ABNORMAL HIGH (ref 135–145)
Total Bilirubin: 0.2 mg/dL — ABNORMAL LOW (ref 0.3–1.2)
Total Protein: 6.6 g/dL (ref 6.5–8.1)

## 2018-04-26 LAB — CBC WITH DIFFERENTIAL (CANCER CENTER ONLY)
Abs Immature Granulocytes: 0.04 10*3/uL (ref 0.00–0.07)
Basophils Absolute: 0 10*3/uL (ref 0.0–0.1)
Basophils Relative: 0 %
Eosinophils Absolute: 0.2 10*3/uL (ref 0.0–0.5)
Eosinophils Relative: 2 %
HCT: 29.2 % — ABNORMAL LOW (ref 36.0–46.0)
Hemoglobin: 9 g/dL — ABNORMAL LOW (ref 12.0–15.0)
Immature Granulocytes: 0 %
Lymphocytes Relative: 13 %
Lymphs Abs: 1.3 10*3/uL (ref 0.7–4.0)
MCH: 34.4 pg — ABNORMAL HIGH (ref 26.0–34.0)
MCHC: 30.8 g/dL (ref 30.0–36.0)
MCV: 111.5 fL — ABNORMAL HIGH (ref 80.0–100.0)
Monocytes Absolute: 1.2 10*3/uL — ABNORMAL HIGH (ref 0.1–1.0)
Monocytes Relative: 12 %
NEUTROS ABS: 7 10*3/uL (ref 1.7–7.7)
Neutrophils Relative %: 73 %
Platelet Count: 285 10*3/uL (ref 150–400)
RBC: 2.62 MIL/uL — ABNORMAL LOW (ref 3.87–5.11)
RDW: 17.2 % — ABNORMAL HIGH (ref 11.5–15.5)
WBC Count: 9.8 10*3/uL (ref 4.0–10.5)
nRBC: 0 % (ref 0.0–0.2)

## 2018-04-26 LAB — TSH: TSH: 2.686 u[IU]/mL (ref 0.308–3.960)

## 2018-04-26 MED ORDER — SODIUM CHLORIDE 0.9 % IV SOLN
200.0000 mg | Freq: Once | INTRAVENOUS | Status: AC
  Administered 2018-04-26: 200 mg via INTRAVENOUS
  Filled 2018-04-26: qty 8

## 2018-04-26 MED ORDER — DEXAMETHASONE SODIUM PHOSPHATE 10 MG/ML IJ SOLN
10.0000 mg | Freq: Once | INTRAMUSCULAR | Status: AC
  Administered 2018-04-26: 10 mg via INTRAVENOUS

## 2018-04-26 MED ORDER — ONDANSETRON HCL 4 MG/2ML IJ SOLN
INTRAMUSCULAR | Status: AC
  Filled 2018-04-26: qty 4

## 2018-04-26 MED ORDER — DEXAMETHASONE SODIUM PHOSPHATE 10 MG/ML IJ SOLN
INTRAMUSCULAR | Status: AC
  Filled 2018-04-26: qty 1

## 2018-04-26 MED ORDER — SODIUM CHLORIDE 0.9 % IV SOLN
Freq: Once | INTRAVENOUS | Status: DC
  Filled 2018-04-26: qty 250

## 2018-04-26 MED ORDER — SODIUM CHLORIDE 0.9% FLUSH
10.0000 mL | INTRAVENOUS | Status: DC | PRN
  Administered 2018-04-26: 10 mL
  Filled 2018-04-26: qty 10

## 2018-04-26 MED ORDER — SODIUM CHLORIDE 0.9 % IV SOLN
490.0000 mg/m2 | Freq: Once | INTRAVENOUS | Status: AC
  Administered 2018-04-26: 1000 mg via INTRAVENOUS
  Filled 2018-04-26: qty 40

## 2018-04-26 MED ORDER — ONDANSETRON HCL 4 MG/2ML IJ SOLN
8.0000 mg | Freq: Once | INTRAMUSCULAR | Status: AC
  Administered 2018-04-26: 8 mg via INTRAVENOUS

## 2018-04-26 MED ORDER — HEPARIN SOD (PORK) LOCK FLUSH 100 UNIT/ML IV SOLN
500.0000 [IU] | Freq: Once | INTRAVENOUS | Status: AC | PRN
  Administered 2018-04-26: 500 [IU]
  Filled 2018-04-26: qty 5

## 2018-04-26 MED ORDER — SODIUM CHLORIDE 0.9 % IV SOLN
Freq: Once | INTRAVENOUS | Status: AC
  Administered 2018-04-26: 12:00:00 via INTRAVENOUS
  Filled 2018-04-26: qty 250

## 2018-04-26 NOTE — Progress Notes (Signed)
[facility] [phone]   [phone]  OFFICE PROGRESS NOTE  [name], MD [place] [address]  DIAGNOSIS:  1) Stage IV (T3, N0, M1 a)non-small cell lung cancer, well-differentiated adenocarcinoma presented with multifocal disease involving mainly the right upper lobe as well as the right middle lobe and left lower lobe of the lung diagnosed in May 2019. 2) bilateral small pulmonary emboli diagnosed incidentally on CT scan of the chest on October 16, 2017  Guardant 360 testing was negative for actionable mutations.  PRIOR THERAPY:None  CURRENT THERAPY: 1) Carboplatin for an AUC of 5, Alimta 500 mg/m2 and Keytruda 200 mg IV given every 3 weeks. First dosegiven on 08/18/2017.Status post 12 cycles.  Starting from cycle #5 the patient is on maintenance Alimta and Ketruda (pembrolizumab) every 3 weeks. 2) Xarelto 15 mg p.o. twice daily for the first 3 weeks followed by 20 mg p.o. Daily.  INTERVAL HISTORY: Erin Myers 78 y.o. female returns to the clinic today for follow-up visit accompanied by her husband.  The patient is feeling fine today except for the baseline shortness of breath and she is currently on home oxygen.  She also has intermittent abdominal pain.  She denied having any nausea, vomiting, diarrhea or constipation.  She denied having any skin rash or itching.  She has no chest pain, cough or hemoptysis.  She has postnasal drainage and she is seen by ENT.  The patient has been tolerating her treatment with maintenance Alimta and Keytruda fairly well.  She had repeat CT scan of the chest, abdomen and pelvis performed recently and she is here for evaluation and discussion of her scan results.   MEDICAL HISTORY: Past Medical History:  Diagnosis Date  . Anxiety   . Diabetes mellitus without complication ([place])   . Dyslipidemia   . HTN (hypertension)   . Hyperlipemia   . Hypothyroid   . Hypothyroidism     . Lung cancer ([place]) 2000   Carcinoid, s/p RLL lobectomy  . Obesity   . Ovarian cancer ([place])   . Palpitations 2009 and 2006   Negative nuclear stress test   . Sleep apnea    on C-pap    ALLERGIES:  is allergic to adhesive [tape]; codeine; lactose intolerance (gi); monosodium glutamate; and sulfa antibiotics.  MEDICATIONS:  Current Outpatient Medications  Medication Sig Dispense Refill  . acetaminophen (TYLENOL) 500 MG tablet Take 500 mg by mouth every 6 (six) hours as needed.    . benzonatate (TESSALON) 200 MG capsule Take 1 capsule (200 mg total) by mouth 3 (three) times daily as needed for cough. 30 capsule 1  . cholecalciferol (VITAMIN D3) 25 MCG (1000 UT) tablet Take 1,000 Units by mouth daily.    . clorazepate (TRANXENE) 7.5 MG tablet Take 7.5 mg by mouth 2 (two) times daily as needed for anxiety.    . folic acid (FOLVITE) 1 MG tablet TAKE 1 TABLET BY MOUTH DAILY. 30 tablet 2  . guaiFENesin-codeine (CHERATUSSIN AC) 100-10 MG/5ML syrup Take 5 mLs by mouth every 6 (six) hours as needed for cough. 240 mL 0  . HYDROcodone-acetaminophen (NORCO/VICODIN) 5-325 MG tablet Take 1-2 tablets by mouth every 4 (four) hours as needed. 30 tablet 0  . ipratropium-albuterol (DUONEB) 0.5-2.5 (3) MG/3ML SOLN INHALE THE CONTENTS OF 1 VIAL VIA NEBULIZER 3 TIMES A DAY. 360 mL 2  . levothyroxine (SYNTHROID, LEVOTHROID) 175 MCG tablet Take 175 mcg by mouth daily before breakfast.    .  lidocaine-prilocaine (EMLA) cream Apply 1 application topically as needed. (Patient not taking: Reported on 03/15/2018) 30 g 0  . losartan-hydrochlorothiazide (HYZAAR) 50-12.5 MG per tablet Take 1 tablet by mouth every morning.     . metoprolol succinate (TOPROL-XL) 50 MG 24 hr tablet Take 50 mg by mouth 2 (two) times daily.     . Multiple Vitamins-Minerals (VISION FORMULA 2 PO) Take by mouth. OTC "Vision pills for Macular degeneration"    . prochlorperazine (COMPAZINE) 10 MG tablet Take 1 tablet (10 mg total) by mouth every 6  (six) hours as needed for nausea or vomiting. (Patient not taking: Reported on 03/15/2018) 30 tablet 0  . rosuvastatin (CRESTOR) 10 MG tablet Take 10 mg by mouth every evening.     . SYMBICORT 160-4.5 MCG/ACT inhaler INHALE 2 PUFFS INTO THE LUNGS 2 TIMES DAILY. 10.2 g 6  . valACYclovir (VALTREX) 500 MG tablet Take 500 mg by mouth daily.    [name] 20 MG TABS tablet TAKE 1 TABLET (20 MG TOTAL) BY MOUTH DAILY WITH SUPPER. 30 tablet 1   No current facility-administered medications for this visit.     SURGICAL HISTORY:  Past Surgical History:  Procedure Laterality Date  . ABDOMINAL HYSTERECTOMY  2001  . GRADED EXERCISE TOLERANCE TEST  08/2015    Blood pressure demonstrated a hypertensive response to exercise.  There was less than 0.76mm of horizontal ST segment depression in the inferolateral leads. No diagnostic criterial for ischemia. Moderately impaired exercise tolerance. The patient achieved 5.8 mets. This is a low risk study.  . IR FLUORO GUIDE PORT INSERTION RIGHT  08/17/2017  . IR US GUIDE VASC ACCESS RIGHT  08/17/2017  . LOBECTOMY  2000   RLL  . NM MYOCAR PERF WALL MOTION  08/20/2007   EF 74%  EXERCISE CAPACITY 7 METS  . OOPHORECTOMY  2001  . TONSILLECTOMY    . VIDEO BRONCHOSCOPY Bilateral 07/20/2017   Procedure: VIDEO BRONCHOSCOPY WITH FLUORO;  Surgeon: Collene Gobble, MD;  Location: [facility];  Service: Cardiopulmonary;  Laterality: Bilateral;    REVIEW OF SYSTEMS:  Constitutional: positive for fatigue Eyes: negative Ears, nose, mouth, throat, and face: negative Respiratory: positive for dyspnea on exertion Cardiovascular: negative Gastrointestinal: negative Genitourinary:negative Integument/breast: negative Hematologic/lymphatic: negative Musculoskeletal:positive for arthralgias Neurological: negative Behavioral/Psych: negative Endocrine: negative Allergic/Immunologic: negative   PHYSICAL EXAMINATION: General appearance: alert, cooperative, fatigued and no  distress Head: Normocephalic, without obvious abnormality, atraumatic Neck: no adenopathy, no JVD, supple, symmetrical, trachea midline and thyroid not enlarged, symmetric, no tenderness/mass/nodules Lymph nodes: Cervical, supraclavicular, and axillary nodes normal. Resp: clear to auscultation bilaterally Back: symmetric, no curvature. ROM normal. No CVA tenderness. Cardio: regular rate and rhythm, S1, S2 normal, no murmur, click, rub or gallop GI: soft, non-tender; bowel sounds normal; no masses,  no organomegaly Extremities: extremities normal, atraumatic, no cyanosis or edema Neurologic: Alert and oriented X 3, normal strength and tone. Normal symmetric reflexes. Normal coordination and gait  ECOG PERFORMANCE STATUS: 1 - Symptomatic but completely ambulatory  Blood pressure (!) 114/56, pulse 84, temperature 98.5 F (36.9 C), temperature source Oral, resp. rate 18, height 5\' 4"  (1.626 m), weight 172 lb 9.6 oz (78.3 kg), SpO2 99 %.  LABORATORY DATA: Lab Results  Component Value Date   WBC 6.9 04/05/2018   HGB 9.3 (L) 04/05/2018   HCT 30.6 (L) 04/05/2018   MCV 111.3 (H) 04/05/2018   PLT 285 04/05/2018      Chemistry      Component Value Date/Time  NA 146 (H) 04/05/2018 0932   K 3.8 04/05/2018 0932   CL 105 04/05/2018 0932   CO2 31 04/05/2018 0932   BUN 14 04/05/2018 0932   CREATININE 0.89 04/05/2018 0932      Component Value Date/Time   CALCIUM 9.2 04/05/2018 0932   ALKPHOS 59 04/05/2018 0932   AST 21 04/05/2018 0932   ALT 15 04/05/2018 0932   BILITOT 0.2 (L) 04/05/2018 0932       RADIOGRAPHIC STUDIES: Ct Chest W Contrast  Result Date: 04/23/2018 CLINICAL DATA:  Stage IV non-small-cell lung cancer. Well-differentiated adenocarcinoma, primarily in the right upper lobe. Diagnosed in May. Pulmonary emboli diagnosed in August. EXAM: CT CHEST, ABDOMEN, AND PELVIS WITH CONTRAST TECHNIQUE: Multidetector CT imaging of the chest, abdomen and pelvis was performed following the  standard protocol during bolus administration of intravenous contrast. CONTRAST:  123mL OMNIPAQUE IOHEXOL 300 MG/ML  SOLN COMPARISON:  02/20/2018 FINDINGS: CT CHEST FINDINGS Cardiovascular: A right Port-A-Cath terminates at the mid right atrium. Aortic atherosclerosis. Tortuous thoracic aorta. Normal heart size, without pericardial effusion. No central pulmonary embolism, on this non-dedicated study. Mediastinum/Nodes: No supraclavicular adenopathy. Node within the azygoesophageal recess is similar at 10 mm on image 31/2. No hilar adenopathy. Lungs/Pleura: Small left pleural effusion is similar. No dominant pulmonary nodule or mass. Mild improvement in ground-glass and soft tissue density centered in the posterior right upper lobe, less so superior segment right lower lobe Musculoskeletal: No acute osseous abnormality. CT ABDOMEN PELVIS FINDINGS Hepatobiliary: Normal liver. Normal gallbladder, without biliary ductal dilatation. Pancreas: Normal, without mass or ductal dilatation. Spleen: Normal in size, without focal abnormality. Adrenals/Urinary Tract: Normal adrenal glands. Left external pelvis. Too small to characterize lesions in both kidneys. Normal urinary bladder. Stomach/Bowel: Normal stomach, without wall thickening. Normal colon and terminal ileum. Normal small bowel. Vascular/Lymphatic: Advanced aortic and branch vessel atherosclerosis. No abdominopelvic adenopathy. Reproductive: Hysterectomy.  No adnexal mass. Other: No significant free fluid. Mild pelvic floor laxity. No evidence of omental or peritoneal disease. Musculoskeletal: No acute osseous abnormality. IMPRESSION: 1. Mild response to therapy, as evidenced by improved right lung aeration. 2. Similar borderline thoracic adenopathy. 3. No evidence of extrathoracic metastatic disease. 4. Similar trace right pleural fluid. 5.  Aortic Atherosclerosis (ICD10-I70.0). Electronically Signed   By: Abigail Miyamoto M.D.   On: 04/23/2018 21:25   Ct Abdomen  Pelvis W Contrast  Result Date: 04/23/2018 CLINICAL DATA:  Stage IV non-small-cell lung cancer. Well-differentiated adenocarcinoma, primarily in the right upper lobe. Diagnosed in May. Pulmonary emboli diagnosed in August. EXAM: CT CHEST, ABDOMEN, AND PELVIS WITH CONTRAST TECHNIQUE: Multidetector CT imaging of the chest, abdomen and pelvis was performed following the standard protocol during bolus administration of intravenous contrast. CONTRAST:  126mL OMNIPAQUE IOHEXOL 300 MG/ML  SOLN COMPARISON:  02/20/2018 FINDINGS: CT CHEST FINDINGS Cardiovascular: A right Port-A-Cath terminates at the mid right atrium. Aortic atherosclerosis. Tortuous thoracic aorta. Normal heart size, without pericardial effusion. No central pulmonary embolism, on this non-dedicated study. Mediastinum/Nodes: No supraclavicular adenopathy. Node within the azygoesophageal recess is similar at 10 mm on image 31/2. No hilar adenopathy. Lungs/Pleura: Small left pleural effusion is similar. No dominant pulmonary nodule or mass. Mild improvement in ground-glass and soft tissue density centered in the posterior right upper lobe, less so superior segment right lower lobe Musculoskeletal: No acute osseous abnormality. CT ABDOMEN PELVIS FINDINGS Hepatobiliary: Normal liver. Normal gallbladder, without biliary ductal dilatation. Pancreas: Normal, without mass or ductal dilatation. Spleen: Normal in size, without focal abnormality. Adrenals/Urinary Tract: Normal adrenal glands.  Left external pelvis. Too small to characterize lesions in both kidneys. Normal urinary bladder. Stomach/Bowel: Normal stomach, without wall thickening. Normal colon and terminal ileum. Normal small bowel. Vascular/Lymphatic: Advanced aortic and branch vessel atherosclerosis. No abdominopelvic adenopathy. Reproductive: Hysterectomy.  No adnexal mass. Other: No significant free fluid. Mild pelvic floor laxity. No evidence of omental or peritoneal disease. Musculoskeletal: No acute  osseous abnormality. IMPRESSION: 1. Mild response to therapy, as evidenced by improved right lung aeration. 2. Similar borderline thoracic adenopathy. 3. No evidence of extrathoracic metastatic disease. 4. Similar trace right pleural fluid. 5.  Aortic Atherosclerosis (ICD10-I70.0). Electronically Signed   By: Abigail Miyamoto M.D.   On: 04/23/2018 21:25    ASSESSMENT AND PLAN: This is a very pleasant 78 years old white female with stage IV non-small cell lung cancer, adenocarcinoma with no actionable mutations. She is currently undergoing systemic chemotherapy with carboplatin, Alimta and Ketruda (pembrolizumab) status post 12 cycles.  Starting from cycle #5 she is on maintenance treatment with Alimta and Ketruda (pembrolizumab). The patient has been tolerating the treatment well except for increasing fatigue.  She had repeat CT scan of the chest, abdomen and pelvis performed recently.  I personally and independently reviewed the scans and discussed the results with the patient and her husband today. Her scan showed stable to mild improvement of her disease. I recommended for her to continue her current treatment with maintenance Alimta and Keytruda and she will proceed with cycle #13 today as a schedule. 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 For the COPD, she will continue on home oxygen and she has follow-up appointment with her pulmonologist in few weeks. I will see her back for follow-up visit in 3 weeks for evaluation before the next cycle of her treatment. She was advised to call immediately if she has any concerning symptoms in the interval. The patient voices understanding of current disease status and treatment options and is in agreement with the current care plan. All questions were answered. The patient knows to call the clinic with any problems, questions or concerns. We can  certainly see the patient much sooner if necessary.  Disclaimer: This note was dictated with voice recognition software. Similar sounding words can inadvertently be transcribed and may not be corrected upon review.

## 2018-04-26 NOTE — Telephone Encounter (Signed)
Scheduled appt per 2/13 los - pt to get an updated schedule next visit .

## 2018-04-26 NOTE — Patient Instructions (Signed)
[facility] Discharge Instructions for Patients Receiving Chemotherapy  Today you received the following chemotherapy agents :  Keytruda,  Alimta.  To help prevent nausea and vomiting after your treatment, we encourage you to take your nausea medication as prescribed.   If you develop nausea and vomiting that is not controlled by your nausea medication, call the clinic.   BELOW ARE SYMPTOMS THAT SHOULD BE REPORTED IMMEDIATELY:  *FEVER GREATER THAN 100.5 F  *CHILLS WITH OR WITHOUT FEVER  NAUSEA AND VOMITING THAT IS NOT CONTROLLED WITH YOUR NAUSEA MEDICATION  *UNUSUAL SHORTNESS OF BREATH  *UNUSUAL BRUISING OR BLEEDING  TENDERNESS IN MOUTH AND THROAT WITH OR WITHOUT PRESENCE OF ULCERS  *URINARY PROBLEMS  *BOWEL PROBLEMS  UNUSUAL RASH Items with * indicate a potential emergency and should be followed up as soon as possible.  Feel free to call the clinic should you have any questions or concerns. The clinic phone number is [phone].  Please show the [facility] at check-in to the Emergency Department and triage nurse.

## 2018-04-27 NOTE — Telephone Encounter (Signed)
I looked at the CT Scans -- I do not see anything on these findings that suggest any issues with the heart.   Last evaluation of the heart showed pretty normal findings with the exception of elevated pressures in the lungs (that goes along with the known lung disease).   I think the shortness of breath is probably related to the lung disease.  Glenetta Hew, MD

## 2018-05-04 ENCOUNTER — Other Ambulatory Visit: Payer: Self-pay | Admitting: Pulmonary Disease

## 2018-05-04 ENCOUNTER — Other Ambulatory Visit: Payer: Self-pay | Admitting: Internal Medicine

## 2018-05-04 DIAGNOSIS — I2699 Other pulmonary embolism without acute cor pulmonale: Secondary | ICD-10-CM

## 2018-05-07 ENCOUNTER — Other Ambulatory Visit: Payer: Self-pay | Admitting: Oncology

## 2018-05-08 ENCOUNTER — Other Ambulatory Visit: Payer: Self-pay | Admitting: Medical Oncology

## 2018-05-09 DIAGNOSIS — J449 Chronic obstructive pulmonary disease, unspecified: Secondary | ICD-10-CM | POA: Diagnosis not present

## 2018-05-10 ENCOUNTER — Ambulatory Visit: Payer: PPO | Admitting: Internal Medicine

## 2018-05-10 ENCOUNTER — Other Ambulatory Visit: Payer: PPO

## 2018-05-10 ENCOUNTER — Ambulatory Visit: Payer: PPO

## 2018-05-15 ENCOUNTER — Other Ambulatory Visit: Payer: Self-pay | Admitting: Oncology

## 2018-05-16 ENCOUNTER — Other Ambulatory Visit: Payer: Self-pay

## 2018-05-16 ENCOUNTER — Inpatient Hospital Stay: Payer: PPO | Attending: Internal Medicine

## 2018-05-16 ENCOUNTER — Inpatient Hospital Stay (HOSPITAL_BASED_OUTPATIENT_CLINIC_OR_DEPARTMENT_OTHER): Payer: PPO | Admitting: Internal Medicine

## 2018-05-16 ENCOUNTER — Other Ambulatory Visit: Payer: Self-pay | Admitting: *Deleted

## 2018-05-16 ENCOUNTER — Encounter: Payer: Self-pay | Admitting: Internal Medicine

## 2018-05-16 ENCOUNTER — Inpatient Hospital Stay: Payer: PPO

## 2018-05-16 VITALS — BP 114/43 | HR 73 | Temp 98.7°F | Resp 18 | Ht 64.0 in | Wt 165.5 lb

## 2018-05-16 DIAGNOSIS — T451X5A Adverse effect of antineoplastic and immunosuppressive drugs, initial encounter: Secondary | ICD-10-CM | POA: Diagnosis not present

## 2018-05-16 DIAGNOSIS — I2699 Other pulmonary embolism without acute cor pulmonale: Secondary | ICD-10-CM

## 2018-05-16 DIAGNOSIS — M545 Low back pain: Secondary | ICD-10-CM | POA: Diagnosis not present

## 2018-05-16 DIAGNOSIS — C3491 Malignant neoplasm of unspecified part of right bronchus or lung: Secondary | ICD-10-CM

## 2018-05-16 DIAGNOSIS — I700 Atherosclerosis of aorta: Secondary | ICD-10-CM

## 2018-05-16 DIAGNOSIS — Z7901 Long term (current) use of anticoagulants: Secondary | ICD-10-CM

## 2018-05-16 DIAGNOSIS — Z79899 Other long term (current) drug therapy: Secondary | ICD-10-CM | POA: Insufficient documentation

## 2018-05-16 DIAGNOSIS — C7802 Secondary malignant neoplasm of left lung: Secondary | ICD-10-CM | POA: Insufficient documentation

## 2018-05-16 DIAGNOSIS — Z5111 Encounter for antineoplastic chemotherapy: Secondary | ICD-10-CM | POA: Diagnosis not present

## 2018-05-16 DIAGNOSIS — C3481 Malignant neoplasm of overlapping sites of right bronchus and lung: Secondary | ICD-10-CM | POA: Diagnosis not present

## 2018-05-16 DIAGNOSIS — E039 Hypothyroidism, unspecified: Secondary | ICD-10-CM | POA: Diagnosis not present

## 2018-05-16 DIAGNOSIS — E785 Hyperlipidemia, unspecified: Secondary | ICD-10-CM | POA: Diagnosis not present

## 2018-05-16 DIAGNOSIS — R590 Localized enlarged lymph nodes: Secondary | ICD-10-CM

## 2018-05-16 DIAGNOSIS — G4730 Sleep apnea, unspecified: Secondary | ICD-10-CM | POA: Insufficient documentation

## 2018-05-16 DIAGNOSIS — E119 Type 2 diabetes mellitus without complications: Secondary | ICD-10-CM

## 2018-05-16 DIAGNOSIS — D6481 Anemia due to antineoplastic chemotherapy: Secondary | ICD-10-CM | POA: Insufficient documentation

## 2018-05-16 DIAGNOSIS — Z86711 Personal history of pulmonary embolism: Secondary | ICD-10-CM | POA: Diagnosis not present

## 2018-05-16 DIAGNOSIS — F419 Anxiety disorder, unspecified: Secondary | ICD-10-CM | POA: Diagnosis not present

## 2018-05-16 DIAGNOSIS — J449 Chronic obstructive pulmonary disease, unspecified: Secondary | ICD-10-CM | POA: Insufficient documentation

## 2018-05-16 DIAGNOSIS — E669 Obesity, unspecified: Secondary | ICD-10-CM | POA: Diagnosis not present

## 2018-05-16 DIAGNOSIS — Z5112 Encounter for antineoplastic immunotherapy: Secondary | ICD-10-CM

## 2018-05-16 DIAGNOSIS — I10 Essential (primary) hypertension: Secondary | ICD-10-CM | POA: Insufficient documentation

## 2018-05-16 DIAGNOSIS — Z95828 Presence of other vascular implants and grafts: Secondary | ICD-10-CM

## 2018-05-16 LAB — CBC WITH DIFFERENTIAL (CANCER CENTER ONLY)
Abs Immature Granulocytes: 0.04 10*3/uL (ref 0.00–0.07)
Basophils Absolute: 0 10*3/uL (ref 0.0–0.1)
Basophils Relative: 0 %
Eosinophils Absolute: 0.2 10*3/uL (ref 0.0–0.5)
Eosinophils Relative: 2 %
HEMATOCRIT: 26.9 % — AB (ref 36.0–46.0)
Hemoglobin: 8.2 g/dL — ABNORMAL LOW (ref 12.0–15.0)
Immature Granulocytes: 1 %
LYMPHS ABS: 1.5 10*3/uL (ref 0.7–4.0)
Lymphocytes Relative: 19 %
MCH: 34.9 pg — ABNORMAL HIGH (ref 26.0–34.0)
MCHC: 30.5 g/dL (ref 30.0–36.0)
MCV: 114.5 fL — ABNORMAL HIGH (ref 80.0–100.0)
Monocytes Absolute: 1 10*3/uL (ref 0.1–1.0)
Monocytes Relative: 12 %
Neutro Abs: 5.2 10*3/uL (ref 1.7–7.7)
Neutrophils Relative %: 66 %
Platelet Count: 291 10*3/uL (ref 150–400)
RBC: 2.35 MIL/uL — ABNORMAL LOW (ref 3.87–5.11)
RDW: 17 % — ABNORMAL HIGH (ref 11.5–15.5)
WBC Count: 7.9 10*3/uL (ref 4.0–10.5)
nRBC: 0 % (ref 0.0–0.2)

## 2018-05-16 LAB — CMP (CANCER CENTER ONLY)
ALT: 10 U/L (ref 0–44)
AST: 19 U/L (ref 15–41)
Albumin: 2.7 g/dL — ABNORMAL LOW (ref 3.5–5.0)
Alkaline Phosphatase: 58 U/L (ref 38–126)
Anion gap: 11 (ref 5–15)
BUN: 9 mg/dL (ref 8–23)
CO2: 30 mmol/L (ref 22–32)
CREATININE: 1.03 mg/dL — AB (ref 0.44–1.00)
Calcium: 9 mg/dL (ref 8.9–10.3)
Chloride: 105 mmol/L (ref 98–111)
GFR, Est AFR Am: >60 mL/min (ref >60)
GFR, Estimated: 53 mL/min — ABNORMAL LOW (ref >60)
Glucose, Bld: 122 mg/dL — ABNORMAL HIGH (ref 70–99)
Potassium: 3.5 mmol/L (ref 3.5–5.1)
Sodium: 146 mmol/L — ABNORMAL HIGH (ref 135–145)
Total Bilirubin: 0.3 mg/dL (ref 0.3–1.2)
Total Protein: 6.3 g/dL — ABNORMAL LOW (ref 6.5–8.1)

## 2018-05-16 LAB — ABO/RH: ABO/RH(D): A NEG

## 2018-05-16 LAB — PREPARE RBC (CROSSMATCH)

## 2018-05-16 MED ORDER — DEXAMETHASONE SODIUM PHOSPHATE 10 MG/ML IJ SOLN
10.0000 mg | Freq: Once | INTRAMUSCULAR | Status: AC
  Administered 2018-05-16: 10 mg via INTRAVENOUS

## 2018-05-16 MED ORDER — PROCHLORPERAZINE MALEATE 10 MG PO TABS: 10.0000 mg | ORAL_TABLET | Freq: Four times a day (QID) | ORAL | 0 refills | Status: DC | PRN

## 2018-05-16 MED ORDER — SODIUM CHLORIDE 0.9 % IV SOLN
Freq: Once | INTRAVENOUS | Status: AC
  Administered 2018-05-16: 11:00:00 via INTRAVENOUS
  Filled 2018-05-16: qty 250

## 2018-05-16 MED ORDER — HEPARIN SOD (PORK) LOCK FLUSH 100 UNIT/ML IV SOLN
500.0000 [IU] | Freq: Once | INTRAVENOUS | Status: AC | PRN
  Administered 2018-05-16: 500 [IU]
  Filled 2018-05-16: qty 5

## 2018-05-16 MED ORDER — SODIUM CHLORIDE 0.9% FLUSH
10.0000 mL | INTRAVENOUS | Status: DC | PRN
  Administered 2018-05-16: 10 mL
  Filled 2018-05-16: qty 10

## 2018-05-16 MED ORDER — ONDANSETRON HCL 4 MG/2ML IJ SOLN
8.0000 mg | Freq: Once | INTRAMUSCULAR | Status: AC
  Administered 2018-05-16: 8 mg via INTRAVENOUS

## 2018-05-16 MED ORDER — SODIUM CHLORIDE 0.9 % IV SOLN
200.0000 mg | Freq: Once | INTRAVENOUS | Status: AC
  Administered 2018-05-16: 200 mg via INTRAVENOUS
  Filled 2018-05-16: qty 8

## 2018-05-16 MED ORDER — ONDANSETRON HCL 4 MG/2ML IJ SOLN
INTRAMUSCULAR | Status: AC
  Filled 2018-05-16: qty 4

## 2018-05-16 MED ORDER — SODIUM CHLORIDE 0.9 % IV SOLN
490.0000 mg/m2 | Freq: Once | INTRAVENOUS | Status: AC
  Administered 2018-05-16: 1000 mg via INTRAVENOUS
  Filled 2018-05-16: qty 40

## 2018-05-16 MED ORDER — DEXAMETHASONE SODIUM PHOSPHATE 10 MG/ML IJ SOLN
INTRAMUSCULAR | Status: AC
  Filled 2018-05-16: qty 1

## 2018-05-16 NOTE — Progress Notes (Signed)
[facility] [phone]   [phone]  OFFICE PROGRESS NOTE  Erin Battles, MD [place] [address]  DIAGNOSIS:  1) Stage IV (T3, N0, M1 a)non-small cell lung cancer, well-differentiated adenocarcinoma presented with multifocal disease involving mainly the right upper lobe as well as the right middle lobe and left lower lobe of the lung diagnosed in May 2019. 2) bilateral small pulmonary emboli diagnosed incidentally on CT scan of the chest on October 16, 2017  Guardant 360 testing was negative for actionable mutations.  PRIOR THERAPY:None  CURRENT THERAPY: 1) Carboplatin for an AUC of 5, Alimta 500 mg/m2 and Keytruda 200 mg IV given every 3 weeks. First dosegiven on 08/18/2017.Status post 13 cycles.  Starting from cycle #5 the patient is on maintenance Alimta and Ketruda (pembrolizumab) every 3 weeks. 2) Xarelto 15 mg p.o. twice daily for the first 3 weeks followed by 20 mg p.o. Daily.  INTERVAL HISTORY: Erin Myers 78 y.o. female returns to the clinic today for follow-up visit accompanied by her husband.  The patient is feeling fine today with no concerning complaints except for fatigue.  She has a fall 2 weeks ago when her left knee locked up.  She also has mild pain in the lower back after the fall.  She denied having any current chest pain, shortness of breath, cough or hemoptysis.  She denied having any fever or chills.  She has no nausea, vomiting, diarrhea or constipation.  She has been tolerating her treatment with maintenance Alimta and Keytruda fairly well.  She is here for evaluation before starting cycle #14 of her treatment.   MEDICAL HISTORY: Past Medical History:  Diagnosis Date  . Anxiety   . Diabetes mellitus without complication ([place])   . Dyslipidemia   . HTN (hypertension)   . Hyperlipemia   . Hypothyroid   . Hypothyroidism   . Lung cancer ([place]) 2000   Carcinoid, s/p RLL lobectomy  .  Obesity   . Ovarian cancer ([place])   . Palpitations 2009 and 2006   Negative nuclear stress test   . Sleep apnea    on C-pap    ALLERGIES:  is allergic to adhesive [tape]; codeine; lactose intolerance (gi); monosodium glutamate; and sulfa antibiotics.  MEDICATIONS:  Current Outpatient Medications  Medication Sig Dispense Refill  . acetaminophen (TYLENOL) 500 MG tablet Take 500 mg by mouth every 6 (six) hours as needed.    . benzonatate (TESSALON) 200 MG capsule Take 1 capsule (200 mg total) by mouth 3 (three) times daily as needed for cough. 30 capsule 1  . cholecalciferol (VITAMIN D3) 25 MCG (1000 UT) tablet Take 1,000 Units by mouth daily.    . clorazepate (TRANXENE) 7.5 MG tablet Take 7.5 mg by mouth 2 (two) times daily as needed for anxiety.    . folic acid (FOLVITE) 1 MG tablet TAKE 1 TABLET BY MOUTH DAILY. 30 tablet 2  . guaiFENesin-codeine (CHERATUSSIN AC) 100-10 MG/5ML syrup Take 5 mLs by mouth every 6 (six) hours as needed for cough. 240 mL 0  . HYDROcodone-acetaminophen (NORCO/VICODIN) 5-325 MG tablet Take 1-2 tablets by mouth every 4 (four) hours as needed. 30 tablet 0  . ipratropium-albuterol (DUONEB) 0.5-2.5 (3) MG/3ML SOLN INHALE THE CONTENTS OF 1 VIAL VIA NEBULIZER 3 TIMES A DAY. 360 mL 0  . irbesartan-hydrochlorothiazide (AVALIDE) 150-12.5 MG tablet     . levothyroxine (SYNTHROID, LEVOTHROID) 125 MCG tablet     . levothyroxine (SYNTHROID, LEVOTHROID) 175  MCG tablet Take 175 mcg by mouth daily before breakfast.    . lidocaine-prilocaine (EMLA) cream Apply 1 application topically as needed. 30 g 0  . losartan-hydrochlorothiazide (HYZAAR) 50-12.5 MG per tablet Take 1 tablet by mouth every morning.     . metoprolol succinate (TOPROL-XL) 50 MG 24 hr tablet Take 50 mg by mouth 2 (two) times daily.     . Multiple Vitamins-Minerals (VISION FORMULA 2 PO) Take by mouth. OTC "Vision pills for Macular degeneration"    . prochlorperazine (COMPAZINE) 10 MG tablet Take 1 tablet (10 mg  total) by mouth every 6 (six) hours as needed for nausea or vomiting. 30 tablet 0  . rosuvastatin (CRESTOR) 10 MG tablet Take 10 mg by mouth every evening.     . SYMBICORT 160-4.5 MCG/ACT inhaler INHALE 2 PUFFS INTO THE LUNGS 2 TIMES DAILY. 10.2 g 6  . valACYclovir (VALTREX) 500 MG tablet Take 500 mg by mouth daily.    [name] 20 MG TABS tablet TAKE 1 TABLET (20 MG TOTAL) BY MOUTH DAILY WITH SUPPER. 30 tablet 1   No current facility-administered medications for this visit.     SURGICAL HISTORY:  Past Surgical History:  Procedure Laterality Date  . ABDOMINAL HYSTERECTOMY  2001  . GRADED EXERCISE TOLERANCE TEST  08/2015    Blood pressure demonstrated a hypertensive response to exercise.  There was less than 0.55mm of horizontal ST segment depression in the inferolateral leads. No diagnostic criterial for ischemia. Moderately impaired exercise tolerance. The patient achieved 5.8 mets. This is a low risk study.  . IR FLUORO GUIDE PORT INSERTION RIGHT  08/17/2017  . IR US GUIDE VASC ACCESS RIGHT  08/17/2017  . LOBECTOMY  2000   RLL  . NM MYOCAR PERF WALL MOTION  08/20/2007   EF 74%  EXERCISE CAPACITY 7 METS  . OOPHORECTOMY  2001  . TONSILLECTOMY    . VIDEO BRONCHOSCOPY Bilateral 07/20/2017   Procedure: VIDEO BRONCHOSCOPY WITH FLUORO;  Surgeon: Collene Gobble, MD;  Location: [facility];  Service: Cardiopulmonary;  Laterality: Bilateral;    REVIEW OF SYSTEMS:  Constitutional: positive for fatigue Eyes: negative Ears, nose, mouth, throat, and face: negative Respiratory: positive for dyspnea on exertion Cardiovascular: negative Gastrointestinal: negative Genitourinary:negative Integument/breast: negative Hematologic/lymphatic: negative Musculoskeletal:positive for arthralgias and back pain Neurological: negative Behavioral/Psych: negative Endocrine: negative Allergic/Immunologic: negative   PHYSICAL EXAMINATION: General appearance: alert, cooperative, fatigued and no distress Head:  Normocephalic, without obvious abnormality, atraumatic Neck: no adenopathy, no JVD, supple, symmetrical, trachea midline and thyroid not enlarged, symmetric, no tenderness/mass/nodules Lymph nodes: Cervical, supraclavicular, and axillary nodes normal. Resp: clear to auscultation bilaterally Back: symmetric, no curvature. ROM normal. No CVA tenderness. Cardio: regular rate and rhythm, S1, S2 normal, no murmur, click, rub or gallop GI: soft, non-tender; bowel sounds normal; no masses,  no organomegaly Extremities: extremities normal, atraumatic, no cyanosis or edema Neurologic: Alert and oriented X 3, normal strength and tone. Normal symmetric reflexes. Normal coordination and gait  ECOG PERFORMANCE STATUS: 1 - Symptomatic but completely ambulatory  Blood pressure (!) 114/43, pulse 73, temperature 98.7 F (37.1 C), temperature source Oral, resp. rate 18, height 5\' 4"  (1.626 m), weight 165 lb 8 oz (75.1 kg), SpO2 100 %.  LABORATORY DATA: Lab Results  Component Value Date   WBC 7.9 05/16/2018   HGB 8.2 (L) 05/16/2018   HCT 26.9 (L) 05/16/2018   MCV 114.5 (H) 05/16/2018   PLT 291 05/16/2018      Chemistry  Component Value Date/Time   NA 146 (H) 04/26/2018 1008   K 3.6 04/26/2018 1008   CL 106 04/26/2018 1008   CO2 30 04/26/2018 1008   BUN 12 04/26/2018 1008   CREATININE 0.95 04/26/2018 1008      Component Value Date/Time   CALCIUM 9.3 04/26/2018 1008   ALKPHOS 58 04/26/2018 1008   AST 19 04/26/2018 1008   ALT 11 04/26/2018 1008   BILITOT 0.2 (L) 04/26/2018 1008       RADIOGRAPHIC STUDIES: Ct Chest W Contrast  Result Date: 04/23/2018 CLINICAL DATA:  Stage IV non-small-cell lung cancer. Well-differentiated adenocarcinoma, primarily in the right upper lobe. Diagnosed in May. Pulmonary emboli diagnosed in August. EXAM: CT CHEST, ABDOMEN, AND PELVIS WITH CONTRAST TECHNIQUE: Multidetector CT imaging of the chest, abdomen and pelvis was performed following the standard  protocol during bolus administration of intravenous contrast. CONTRAST:  120mL OMNIPAQUE IOHEXOL 300 MG/ML  SOLN COMPARISON:  02/20/2018 FINDINGS: CT CHEST FINDINGS Cardiovascular: A right Port-A-Cath terminates at the mid right atrium. Aortic atherosclerosis. Tortuous thoracic aorta. Normal heart size, without pericardial effusion. No central pulmonary embolism, on this non-dedicated study. Mediastinum/Nodes: No supraclavicular adenopathy. Node within the azygoesophageal recess is similar at 10 mm on image 31/2. No hilar adenopathy. Lungs/Pleura: Small left pleural effusion is similar. No dominant pulmonary nodule or mass. Mild improvement in ground-glass and soft tissue density centered in the posterior right upper lobe, less so superior segment right lower lobe Musculoskeletal: No acute osseous abnormality. CT ABDOMEN PELVIS FINDINGS Hepatobiliary: Normal liver. Normal gallbladder, without biliary ductal dilatation. Pancreas: Normal, without mass or ductal dilatation. Spleen: Normal in size, without focal abnormality. Adrenals/Urinary Tract: Normal adrenal glands. Left external pelvis. Too small to characterize lesions in both kidneys. Normal urinary bladder. Stomach/Bowel: Normal stomach, without wall thickening. Normal colon and terminal ileum. Normal small bowel. Vascular/Lymphatic: Advanced aortic and branch vessel atherosclerosis. No abdominopelvic adenopathy. Reproductive: Hysterectomy.  No adnexal mass. Other: No significant free fluid. Mild pelvic floor laxity. No evidence of omental or peritoneal disease. Musculoskeletal: No acute osseous abnormality. IMPRESSION: 1. Mild response to therapy, as evidenced by improved right lung aeration. 2. Similar borderline thoracic adenopathy. 3. No evidence of extrathoracic metastatic disease. 4. Similar trace right pleural fluid. 5.  Aortic Atherosclerosis (ICD10-I70.0). Electronically Signed   By: Abigail Miyamoto M.D.   On: 04/23/2018 21:25   Ct Abdomen Pelvis W  Contrast  Result Date: 04/23/2018 CLINICAL DATA:  Stage IV non-small-cell lung cancer. Well-differentiated adenocarcinoma, primarily in the right upper lobe. Diagnosed in May. Pulmonary emboli diagnosed in August. EXAM: CT CHEST, ABDOMEN, AND PELVIS WITH CONTRAST TECHNIQUE: Multidetector CT imaging of the chest, abdomen and pelvis was performed following the standard protocol during bolus administration of intravenous contrast. CONTRAST:  118mL OMNIPAQUE IOHEXOL 300 MG/ML  SOLN COMPARISON:  02/20/2018 FINDINGS: CT CHEST FINDINGS Cardiovascular: A right Port-A-Cath terminates at the mid right atrium. Aortic atherosclerosis. Tortuous thoracic aorta. Normal heart size, without pericardial effusion. No central pulmonary embolism, on this non-dedicated study. Mediastinum/Nodes: No supraclavicular adenopathy. Node within the azygoesophageal recess is similar at 10 mm on image 31/2. No hilar adenopathy. Lungs/Pleura: Small left pleural effusion is similar. No dominant pulmonary nodule or mass. Mild improvement in ground-glass and soft tissue density centered in the posterior right upper lobe, less so superior segment right lower lobe Musculoskeletal: No acute osseous abnormality. CT ABDOMEN PELVIS FINDINGS Hepatobiliary: Normal liver. Normal gallbladder, without biliary ductal dilatation. Pancreas: Normal, without mass or ductal dilatation. Spleen: Normal in size, without focal abnormality.  Adrenals/Urinary Tract: Normal adrenal glands. Left external pelvis. Too small to characterize lesions in both kidneys. Normal urinary bladder. Stomach/Bowel: Normal stomach, without wall thickening. Normal colon and terminal ileum. Normal small bowel. Vascular/Lymphatic: Advanced aortic and branch vessel atherosclerosis. No abdominopelvic adenopathy. Reproductive: Hysterectomy.  No adnexal mass. Other: No significant free fluid. Mild pelvic floor laxity. No evidence of omental or peritoneal disease. Musculoskeletal: No acute osseous  abnormality. IMPRESSION: 1. Mild response to therapy, as evidenced by improved right lung aeration. 2. Similar borderline thoracic adenopathy. 3. No evidence of extrathoracic metastatic disease. 4. Similar trace right pleural fluid. 5.  Aortic Atherosclerosis (ICD10-I70.0). Electronically Signed   By: Abigail Miyamoto M.D.   On: 04/23/2018 21:25    ASSESSMENT AND PLAN: This is a very pleasant 78 years old white female with stage IV non-small cell lung cancer, adenocarcinoma with no actionable mutations. She is currently undergoing systemic chemotherapy with carboplatin, Alimta and Ketruda (pembrolizumab) status post 13 cycles.  Starting from cycle #5 she is on maintenance treatment with Alimta and Ketruda (pembrolizumab). The patient has been tolerating this treatment well with no concerning adverse effect except for fatigue. I recommended for her to proceed with cycle #14 today. For the chemotherapy-induced anemia, we will arrange for the patient to receive 2 units of PRBCs transfusions the next few days. 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 For the COPD, she will continue on home oxygen. The patient will come back for follow-up visit in 3 weeks for evaluation before starting cycle #15. She was advised to call immediately if she has any concerning symptoms in the interval. The patient voices understanding of current disease status and treatment options and is in agreement with the current care plan. All questions were answered. The patient knows to call the clinic with any problems, questions or concerns. We can certainly see the patient much sooner if necessary.  Disclaimer: This note was dictated with voice recognition software. Similar sounding words can inadvertently be transcribed and may not be corrected upon review.

## 2018-05-16 NOTE — Patient Instructions (Signed)
[facility] Discharge Instructions for Patients Receiving Chemotherapy  Today you received the following chemotherapy agents :  Keytruda,  Alimta.  To help prevent nausea and vomiting after your treatment, we encourage you to take your nausea medication as prescribed.   If you develop nausea and vomiting that is not controlled by your nausea medication, call the clinic.   BELOW ARE SYMPTOMS THAT SHOULD BE REPORTED IMMEDIATELY:  *FEVER GREATER THAN 100.5 F  *CHILLS WITH OR WITHOUT FEVER  NAUSEA AND VOMITING THAT IS NOT CONTROLLED WITH YOUR NAUSEA MEDICATION  *UNUSUAL SHORTNESS OF BREATH  *UNUSUAL BRUISING OR BLEEDING  TENDERNESS IN MOUTH AND THROAT WITH OR WITHOUT PRESENCE OF ULCERS  *URINARY PROBLEMS  *BOWEL PROBLEMS  UNUSUAL RASH Items with * indicate a potential emergency and should be followed up as soon as possible.  Feel free to call the clinic should you have any questions or concerns. The clinic phone number is [phone].  Please show the [facility] at check-in to the Emergency Department and triage nurse.

## 2018-05-17 ENCOUNTER — Inpatient Hospital Stay: Payer: PPO

## 2018-05-17 DIAGNOSIS — C3491 Malignant neoplasm of unspecified part of right bronchus or lung: Secondary | ICD-10-CM

## 2018-05-17 DIAGNOSIS — Z5111 Encounter for antineoplastic chemotherapy: Secondary | ICD-10-CM | POA: Diagnosis not present

## 2018-05-17 MED ORDER — DIPHENHYDRAMINE HCL 25 MG PO CAPS
25.0000 mg | ORAL_CAPSULE | Freq: Once | ORAL | Status: AC
  Administered 2018-05-17: 25 mg via ORAL

## 2018-05-17 MED ORDER — HEPARIN SOD (PORK) LOCK FLUSH 100 UNIT/ML IV SOLN
500.0000 [IU] | Freq: Every day | INTRAVENOUS | Status: AC | PRN
  Administered 2018-05-17: 500 [IU]
  Filled 2018-05-17: qty 5

## 2018-05-17 MED ORDER — ACETAMINOPHEN 325 MG PO TABS
650.0000 mg | ORAL_TABLET | Freq: Once | ORAL | Status: AC
  Administered 2018-05-17: 650 mg via ORAL

## 2018-05-17 MED ORDER — DIPHENHYDRAMINE HCL 25 MG PO CAPS
ORAL_CAPSULE | ORAL | Status: AC
  Filled 2018-05-17: qty 1

## 2018-05-17 MED ORDER — SODIUM CHLORIDE 0.9% IV SOLUTION
250.0000 mL | Freq: Once | INTRAVENOUS | Status: AC
  Administered 2018-05-17: 250 mL via INTRAVENOUS
  Filled 2018-05-17: qty 250

## 2018-05-17 MED ORDER — SODIUM CHLORIDE 0.9% FLUSH
10.0000 mL | INTRAVENOUS | Status: AC | PRN
  Administered 2018-05-17: 10 mL
  Filled 2018-05-17: qty 10

## 2018-05-17 MED ORDER — ACETAMINOPHEN 325 MG PO TABS
ORAL_TABLET | ORAL | Status: AC
  Filled 2018-05-17: qty 2

## 2018-05-17 NOTE — Patient Instructions (Signed)
Blood Transfusion, Adult, Care After This sheet gives you information about how to care for yourself after your procedure. Your doctor may also give you more specific instructions. If you have problems or questions, contact your doctor. Follow these instructions at home:   Take over-the-counter and prescription medicines only as told by your doctor.  Go back to your normal activities as told by your doctor.  Follow instructions from your doctor about how to take care of the area where an IV tube was put into your vein (insertion site). Make sure you: ? Wash your hands with soap and water before you change your bandage (dressing). If there is no soap and water, use hand sanitizer. ? Change your bandage as told by your doctor.  Check your IV insertion site every day for signs of infection. Check for: ? More redness, swelling, or pain. ? More fluid or blood. ? Warmth. ? Pus or a bad smell. Contact a doctor if:  You have more redness, swelling, or pain around the IV insertion site.  You have more fluid or blood coming from the IV insertion site.  Your IV insertion site feels warm to the touch.  You have pus or a bad smell coming from the IV insertion site.  Your pee (urine) turns pink, red, or brown.  You feel weak after doing your normal activities. Get help right away if:  You have signs of a serious allergic or body defense (immune) system reaction, including: ? Itchiness. ? Hives. ? Trouble breathing. ? Anxiety. ? Pain in your chest or lower back. ? Fever, flushing, and chills. ? Fast pulse. ? Rash. ? Watery poop (diarrhea). ? Throwing up (vomiting). ? Dark pee. ? Serious headache. ? Dizziness. ? Stiff neck. ? Yellow color in your face or the white parts of your eyes (jaundice). Summary  After a blood transfusion, return to your normal activities as told by your doctor.  Every day, check for signs of infection where the IV tube was put into your vein.  Some  signs of infection are warm skin, more redness and pain, more fluid or blood, and pus or a bad smell where the needle went in.  Contact your doctor if you feel weak or have any unusual symptoms. This information is not intended to replace advice given to you by your health care provider. Make sure you discuss any questions you have with your health care provider. Document Released: 03/21/2014 Document Revised: 10/23/2015 Document Reviewed: 10/23/2015 Elsevier Interactive Patient Education  2019 Elsevier Inc.  

## 2018-05-18 LAB — TYPE AND SCREEN
ABO/RH(D): A NEG
Antibody Screen: NEGATIVE
Unit division: 0
Unit division: 0

## 2018-05-18 LAB — BPAM RBC
BLOOD PRODUCT EXPIRATION DATE: 202004012359
Blood Product Expiration Date: 202004012359
ISSUE DATE / TIME: 202003050952
ISSUE DATE / TIME: 202003050952
Unit Type and Rh: 600
Unit Type and Rh: 600

## 2018-06-04 ENCOUNTER — Telehealth (INDEPENDENT_AMBULATORY_CARE_PROVIDER_SITE_OTHER): Payer: Self-pay | Admitting: Radiology

## 2018-06-04 NOTE — Telephone Encounter (Signed)
I have called patient and asked pre-screening questions.  Do you have now or have you had in the past 7 days a fever and/or chills? NO Do you have now or have you had in the past 7 days a cough? Cough for about a year, patient has lung cancer Do you have now or have you had in the last 7 days nausea, vomiting or abdominal pain? NO Have you been exposed to anyone who has tested positive for COVID-19? NO Have you or anyone who lives with you traveled within the last month? NO

## 2018-06-05 ENCOUNTER — Encounter (INDEPENDENT_AMBULATORY_CARE_PROVIDER_SITE_OTHER): Payer: Self-pay | Admitting: Orthopedic Surgery

## 2018-06-05 ENCOUNTER — Other Ambulatory Visit: Payer: Self-pay

## 2018-06-05 ENCOUNTER — Ambulatory Visit (INDEPENDENT_AMBULATORY_CARE_PROVIDER_SITE_OTHER): Payer: PPO | Admitting: Orthopedic Surgery

## 2018-06-05 ENCOUNTER — Ambulatory Visit (INDEPENDENT_AMBULATORY_CARE_PROVIDER_SITE_OTHER): Payer: PPO

## 2018-06-05 DIAGNOSIS — G8929 Other chronic pain: Secondary | ICD-10-CM

## 2018-06-05 DIAGNOSIS — M25561 Pain in right knee: Secondary | ICD-10-CM | POA: Diagnosis not present

## 2018-06-05 MED ORDER — METHYLPREDNISOLONE ACETATE 40 MG/ML IJ SUSP
40.0000 mg | INTRAMUSCULAR | Status: AC | PRN
  Administered 2018-06-05: 40 mg via INTRA_ARTICULAR

## 2018-06-05 MED ORDER — LIDOCAINE HCL 1 % IJ SOLN
5.0000 mL | INTRAMUSCULAR | Status: AC | PRN
  Administered 2018-06-05: 5 mL

## 2018-06-05 NOTE — Progress Notes (Signed)
Office Visit Note   Patient: Erin Myers           Date of Birth: [birth year]           MRN: [id number] Visit Date: 06/05/2018              Requested by: [name], [place], [place] [address] PCP: [name], MD  Chief Complaint  Patient presents with  . Right Knee - Pain  . Left Hip - Pain      HPI: Patient is a 78 year old woman who presents with chronic right knee pain.  She states she recently has fallen twice.  Once in February and once a week ago.  Patient states that her right knee has been buckling and she has fallen and the patient is a fell on her left hip.  Patient complains of lateral left thigh pain and right knee pain.  Assessment & Plan: Visit Diagnoses:  1. Chronic pain of right knee     Plan: Right knee was injected recommended that she use her walker for stability.  Follow-up in 4 weeks for evaluation of repeat injection.  Follow-Up Instructions: Return in about 4 weeks (around 07/03/2018).   Ortho Exam  Patient is alert, oriented, no adenopathy, well-dressed, normal affect, normal respiratory effort. Examination patient ambulates in a wheelchair.  She has tenderness to palpation along the iliotibial band on the left.  There is no pain with range of motion of the left hip or left knee.  No clinical signs of symptoms of the left hip fracture no pain with weightbearing.  Examination of the right knee she has no effusion she is tender to palpation over the hamstrings laterally most likely from the sprain from her fall.  Collaterals and cruciates are stable the medial lateral joint line are minimally tender to palpation the patellofemoral joint is minimally tender to palpation there is some mild crepitation with range of motion.  No mechanical locking.  Giving way appears to be more symptomatic from the arthritis.  Imaging: Xr Knee 1-2 Views Right  Result Date: 06/05/2018 2 view radiographs of the right knee shows subchondral  sclerosis with joint space narrowing medially on both knees.  Lateral radiograph shows mild spurring of the patella.  No subcondylar cysts.  No images are attached to the encounter.  Labs: Lab Results  Component Value Date   HGBA1C 6.2 (H) 07/19/2017   ESRSEDRATE 13 05/02/2017   REPTSTATUS 07/22/2017 FINAL 07/20/2017   GRAMSTAIN  07/20/2017    FEW WBC PRESENT,BOTH PMN AND MONONUCLEAR NO ORGANISMS SEEN    CULT  07/20/2017    NO GROWTH 2 DAYS Performed at [facility], [place] [address] [place], [place] [address]      Lab Results  Component Value Date   ALBUMIN 2.7 (L) 05/16/2018   ALBUMIN 3.1 (L) 04/26/2018   ALBUMIN 3.2 (L) 04/05/2018    There is no height or weight on file to calculate BMI.  Orders:  Orders Placed This Encounter  Procedures  . XR Knee 1-2 Views Right   No orders of the defined types were placed in this encounter.    Procedures: Large Joint Inj: R knee on 06/05/2018 1:44 PM Indications: pain and diagnostic evaluation Details: 22 G 1.5 in needle, anteromedial approach  Arthrogram: No  Medications: 5 mL lidocaine 1 %; 40 mg methylPREDNISolone acetate 40 MG/ML Outcome: tolerated well, no immediate complications Procedure, treatment alternatives, risks and benefits explained, specific risks discussed. Consent was  given by the patient. Immediately prior to procedure a time out was called to verify the correct patient, procedure, equipment, support staff and site/side marked as required. Patient was prepped and draped in the usual sterile fashion.      Clinical Data: No additional findings.  ROS:  All other systems negative, except as noted in the HPI. Review of Systems  Objective: Vital Signs: There were no vitals taken for this visit.  Specialty Comments:  No specialty comments available.  PMFS History: Patient Active Problem List   Diagnosis Date Noted  . Antineoplastic chemotherapy induced anemia 05/16/2018  . Bronchiectasis  ([place]) 03/12/2018  . Cough 11/10/2017  . Bilateral pulmonary embolism ([place]) 10/19/2017  . Port-A-Cath in place 08/18/2017  . Encounter for antineoplastic immunotherapy 08/10/2017  . Encounter for antineoplastic chemotherapy 08/10/2017  . Adenocarcinoma of right lung, stage 4 ([place]) 07/31/2017  . Goals of care, counseling/discussion 07/31/2017  . Sinusitis 07/19/2017  . Acute on chronic respiratory failure with hypoxia ([place]) 07/19/2017  . Hypokalemia 07/19/2017  . Sepsis ([place]) 07/18/2017  . Abnormal CT of the chest 07/14/2017  . Hypoxemia 07/03/2017  . Community acquired pneumonia of right upper lobe of lung ([place]) 05/30/2017  . Dyspnea 05/02/2017  . 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 History of posttraumatic stress disorder (PTSD) 05/14/2015  . Obesity (BMI 30-39.9) 08/20/2012  . Family history of coronary artery disease 08/17/2012  . Palpitations   . OSA on CPAP   . Essential hypertension   . Dyslipidemia, goal LDL below 130   . Hypothyroid    Past Medical History:  Diagnosis Date  . Anxiety   . Diabetes mellitus without complication ([place])   . Dyslipidemia   . HTN (hypertension)   . Hyperlipemia   . Hypothyroid   . Hypothyroidism   . Lung cancer ([place]) 2000   Carcinoid, s/p RLL lobectomy  . Obesity   . Ovarian cancer ([place])   . Palpitations 2009 and 2006   Negative nuclear stress test   . Sleep apnea    on C-pap    Family History  Problem Relation Age of Onset  . Coronary artery disease Mother 69       died of an MI  . Heart attack Mother   . Suicidality Sister   . CVA Brother 62  . CVA Father     Past Surgical History:  Procedure Laterality Date  . ABDOMINAL HYSTERECTOMY  2001  . GRADED EXERCISE TOLERANCE TEST  08/2015    Blood pressure demonstrated a hypertensive response to exercise.  There was less than 0.34mm of horizontal ST segment depression in the inferolateral leads. No diagnostic criterial for ischemia. Moderately impaired exercise tolerance.  The patient achieved 5.8 mets. This is a low risk study.  . IR FLUORO GUIDE PORT INSERTION RIGHT  08/17/2017  . IR US GUIDE VASC ACCESS RIGHT  08/17/2017  . LOBECTOMY  2000   RLL  . NM MYOCAR PERF WALL MOTION  08/20/2007   EF 74%  EXERCISE CAPACITY 7 METS  . OOPHORECTOMY  2001  . TONSILLECTOMY    . VIDEO BRONCHOSCOPY Bilateral 07/20/2017   Procedure: VIDEO BRONCHOSCOPY WITH FLUORO;  Surgeon: Collene Gobble, MD;  Location: [facility];  Service: Cardiopulmonary;  Laterality: Bilateral;   Social History   Occupational History  . Not on file  Tobacco Use  . Smoking status: Former Smoker    Packs/day: 0.75    Years: 30.00    Pack years: 22.50    Types:  Cigarettes    Last attempt to quit: 08/18/1991    Years since quitting: 26.8  . Smokeless tobacco: Never Used  Substance and Sexual Activity  . Alcohol use: No    Alcohol/week: 0.0 standard drinks  . Drug use: No  . Sexual activity: Yes    Partners: Male

## 2018-06-07 ENCOUNTER — Encounter: Payer: Self-pay | Admitting: Internal Medicine

## 2018-06-07 ENCOUNTER — Inpatient Hospital Stay: Payer: PPO

## 2018-06-07 ENCOUNTER — Inpatient Hospital Stay (HOSPITAL_BASED_OUTPATIENT_CLINIC_OR_DEPARTMENT_OTHER): Payer: PPO | Admitting: Internal Medicine

## 2018-06-07 ENCOUNTER — Other Ambulatory Visit: Payer: Self-pay

## 2018-06-07 VITALS — BP 125/51 | HR 72 | Temp 98.7°F | Resp 18 | Ht 64.0 in | Wt 160.6 lb

## 2018-06-07 DIAGNOSIS — G4730 Sleep apnea, unspecified: Secondary | ICD-10-CM

## 2018-06-07 DIAGNOSIS — Z5112 Encounter for antineoplastic immunotherapy: Secondary | ICD-10-CM | POA: Diagnosis not present

## 2018-06-07 DIAGNOSIS — F419 Anxiety disorder, unspecified: Secondary | ICD-10-CM

## 2018-06-07 DIAGNOSIS — I700 Atherosclerosis of aorta: Secondary | ICD-10-CM

## 2018-06-07 DIAGNOSIS — Z5111 Encounter for antineoplastic chemotherapy: Secondary | ICD-10-CM | POA: Diagnosis not present

## 2018-06-07 DIAGNOSIS — Z95828 Presence of other vascular implants and grafts: Secondary | ICD-10-CM

## 2018-06-07 DIAGNOSIS — J449 Chronic obstructive pulmonary disease, unspecified: Secondary | ICD-10-CM

## 2018-06-07 DIAGNOSIS — E669 Obesity, unspecified: Secondary | ICD-10-CM

## 2018-06-07 DIAGNOSIS — E785 Hyperlipidemia, unspecified: Secondary | ICD-10-CM | POA: Diagnosis not present

## 2018-06-07 DIAGNOSIS — E119 Type 2 diabetes mellitus without complications: Secondary | ICD-10-CM

## 2018-06-07 DIAGNOSIS — C3481 Malignant neoplasm of overlapping sites of right bronchus and lung: Secondary | ICD-10-CM | POA: Diagnosis not present

## 2018-06-07 DIAGNOSIS — Z7901 Long term (current) use of anticoagulants: Secondary | ICD-10-CM

## 2018-06-07 DIAGNOSIS — I10 Essential (primary) hypertension: Secondary | ICD-10-CM | POA: Diagnosis not present

## 2018-06-07 DIAGNOSIS — Z86711 Personal history of pulmonary embolism: Secondary | ICD-10-CM

## 2018-06-07 DIAGNOSIS — R590 Localized enlarged lymph nodes: Secondary | ICD-10-CM

## 2018-06-07 DIAGNOSIS — C3491 Malignant neoplasm of unspecified part of right bronchus or lung: Secondary | ICD-10-CM

## 2018-06-07 DIAGNOSIS — Z79899 Other long term (current) drug therapy: Secondary | ICD-10-CM

## 2018-06-07 DIAGNOSIS — C3490 Malignant neoplasm of unspecified part of unspecified bronchus or lung: Secondary | ICD-10-CM

## 2018-06-07 DIAGNOSIS — D6481 Anemia due to antineoplastic chemotherapy: Secondary | ICD-10-CM | POA: Diagnosis not present

## 2018-06-07 DIAGNOSIS — M545 Low back pain: Secondary | ICD-10-CM

## 2018-06-07 DIAGNOSIS — C7802 Secondary malignant neoplasm of left lung: Secondary | ICD-10-CM

## 2018-06-07 DIAGNOSIS — E039 Hypothyroidism, unspecified: Secondary | ICD-10-CM

## 2018-06-07 DIAGNOSIS — T451X5A Adverse effect of antineoplastic and immunosuppressive drugs, initial encounter: Secondary | ICD-10-CM | POA: Diagnosis not present

## 2018-06-07 LAB — CBC WITH DIFFERENTIAL (CANCER CENTER ONLY)
Abs Immature Granulocytes: 0.07 10*3/uL (ref 0.00–0.07)
Basophils Absolute: 0 10*3/uL (ref 0.0–0.1)
Basophils Relative: 0 %
EOS PCT: 2 %
Eosinophils Absolute: 0.2 10*3/uL (ref 0.0–0.5)
HCT: 31.9 % — ABNORMAL LOW (ref 36.0–46.0)
Hemoglobin: 9.8 g/dL — ABNORMAL LOW (ref 12.0–15.0)
Immature Granulocytes: 1 %
Lymphocytes Relative: 17 %
Lymphs Abs: 1.6 10*3/uL (ref 0.7–4.0)
MCH: 33.8 pg (ref 26.0–34.0)
MCHC: 30.7 g/dL (ref 30.0–36.0)
MCV: 110 fL — AB (ref 80.0–100.0)
MONO ABS: 1.1 10*3/uL — AB (ref 0.1–1.0)
Monocytes Relative: 12 %
Neutro Abs: 6.3 10*3/uL (ref 1.7–7.7)
Neutrophils Relative %: 68 %
Platelet Count: 233 10*3/uL (ref 150–400)
RBC: 2.9 MIL/uL — ABNORMAL LOW (ref 3.87–5.11)
RDW: 17.6 % — ABNORMAL HIGH (ref 11.5–15.5)
WBC Count: 9.2 10*3/uL (ref 4.0–10.5)
nRBC: 0 % (ref 0.0–0.2)

## 2018-06-07 LAB — CMP (CANCER CENTER ONLY)
ALK PHOS: 59 U/L (ref 38–126)
ALT: 15 U/L (ref 0–44)
AST: 19 U/L (ref 15–41)
Albumin: 2.7 g/dL — ABNORMAL LOW (ref 3.5–5.0)
Anion gap: 10 (ref 5–15)
BUN: 13 mg/dL (ref 8–23)
CO2: 29 mmol/L (ref 22–32)
Calcium: 9.1 mg/dL (ref 8.9–10.3)
Chloride: 106 mmol/L (ref 98–111)
Creatinine: 1.15 mg/dL — ABNORMAL HIGH (ref 0.44–1.00)
GFR, Est AFR Am: 54 mL/min — ABNORMAL LOW (ref >60)
GFR, Estimated: 47 mL/min — ABNORMAL LOW (ref >60)
Glucose, Bld: 110 mg/dL — ABNORMAL HIGH (ref 70–99)
Potassium: 3.8 mmol/L (ref 3.5–5.1)
Sodium: 145 mmol/L (ref 135–145)
Total Bilirubin: 0.2 mg/dL — ABNORMAL LOW (ref 0.3–1.2)
Total Protein: 6.5 g/dL (ref 6.5–8.1)

## 2018-06-07 MED ORDER — SODIUM CHLORIDE 0.9% FLUSH
10.0000 mL | INTRAVENOUS | Status: DC | PRN
  Administered 2018-06-07: 10 mL
  Filled 2018-06-07: qty 10

## 2018-06-07 MED ORDER — SODIUM CHLORIDE 0.9 % IV SOLN
Freq: Once | INTRAVENOUS | Status: AC
  Administered 2018-06-07: 11:00:00 via INTRAVENOUS
  Filled 2018-06-07: qty 250

## 2018-06-07 MED ORDER — CYANOCOBALAMIN 1000 MCG/ML IJ SOLN
INTRAMUSCULAR | Status: AC
  Filled 2018-06-07: qty 1

## 2018-06-07 MED ORDER — SODIUM CHLORIDE 0.9 % IV SOLN
200.0000 mg | Freq: Once | INTRAVENOUS | Status: AC
  Administered 2018-06-07: 200 mg via INTRAVENOUS
  Filled 2018-06-07: qty 8

## 2018-06-07 MED ORDER — ONDANSETRON HCL 4 MG/2ML IJ SOLN
INTRAMUSCULAR | Status: AC
  Filled 2018-06-07: qty 4

## 2018-06-07 MED ORDER — DEXAMETHASONE SODIUM PHOSPHATE 10 MG/ML IJ SOLN
10.0000 mg | Freq: Once | INTRAMUSCULAR | Status: AC
  Administered 2018-06-07: 10 mg via INTRAVENOUS

## 2018-06-07 MED ORDER — HEPARIN SOD (PORK) LOCK FLUSH 100 UNIT/ML IV SOLN
500.0000 [IU] | Freq: Once | INTRAVENOUS | Status: AC | PRN
  Administered 2018-06-07: 500 [IU]
  Filled 2018-06-07: qty 5

## 2018-06-07 MED ORDER — DEXAMETHASONE SODIUM PHOSPHATE 10 MG/ML IJ SOLN
INTRAMUSCULAR | Status: AC
  Filled 2018-06-07: qty 1

## 2018-06-07 MED ORDER — CYANOCOBALAMIN 1000 MCG/ML IJ SOLN
1000.0000 ug | Freq: Once | INTRAMUSCULAR | Status: AC
  Administered 2018-06-07: 1000 ug via INTRAMUSCULAR

## 2018-06-07 MED ORDER — ONDANSETRON HCL 4 MG/2ML IJ SOLN
8.0000 mg | Freq: Once | INTRAMUSCULAR | Status: AC
  Administered 2018-06-07: 8 mg via INTRAVENOUS

## 2018-06-07 MED ORDER — SODIUM CHLORIDE 0.9 % IV SOLN
1000.0000 mg | Freq: Once | INTRAVENOUS | Status: AC
  Administered 2018-06-07: 1000 mg via INTRAVENOUS
  Filled 2018-06-07: qty 40

## 2018-06-07 NOTE — Progress Notes (Signed)
[place] [phone]   [phone]  OFFICE PROGRESS NOTE  [name], MD [place] [address]  DIAGNOSIS:  1) Stage IV (T3, N0, M1 a)non-small cell lung cancer, well-differentiated adenocarcinoma presented with multifocal disease involving mainly the right upper lobe as well as the right middle lobe and left lower lobe of the lung diagnosed in May 2019. 2) bilateral small pulmonary emboli diagnosed incidentally on CT scan of the chest on October 16, 2017  Guardant 360 testing was negative for actionable mutations.  PRIOR THERAPY:None  CURRENT THERAPY: 1) Carboplatin for an AUC of 5, Alimta 500 mg/m2 and Keytruda 200 mg IV given every 3 weeks. First dosegiven on 08/18/2017.Status post 14 cycles.  Starting from cycle #5 the patient is on maintenance Alimta and Ketruda (pembrolizumab) every 3 weeks. 2) Xarelto 15 mg p.o. twice daily for the first 3 weeks followed by 20 mg p.o. Daily.  INTERVAL HISTORY: Erin Myers 78 y.o. female returns to the clinic today for follow-up visit.  The patient is feeling fine today with no concerning complaints except for the baseline shortness of breath.  She is currently on home oxygen.  She denied having chest pain, cough or hemoptysis.  She denied having any fever or chills.  She has no nausea, vomiting, diarrhea or constipation.  She has no headache or visual changes.  She continues to tolerate her treatment with maintenance Alimta and Keytruda fairly well.  She is here today for evaluation before starting cycle #15.  MEDICAL HISTORY: Past Medical History:  Diagnosis Date  . Anxiety   . Diabetes mellitus without complication ([place])   . Dyslipidemia   . HTN (hypertension)   . Hyperlipemia   . Hypothyroid   . Hypothyroidism   . Lung cancer ([place]) 2000   Carcinoid, s/p RLL lobectomy  . Obesity   . Ovarian cancer ([place])   . Palpitations 2009 and 2006   Negative nuclear  stress test   . Sleep apnea    on C-pap    ALLERGIES:  is allergic to adhesive [tape]; codeine; lactose intolerance (gi); monosodium glutamate; and sulfa antibiotics.  MEDICATIONS:  Current Outpatient Medications  Medication Sig Dispense Refill  . acetaminophen (TYLENOL) 500 MG tablet Take 500 mg by mouth every 6 (six) hours as needed.    . benzonatate (TESSALON) 200 MG capsule Take 1 capsule (200 mg total) by mouth 3 (three) times daily as needed for cough. 30 capsule 1  . cholecalciferol (VITAMIN D3) 25 MCG (1000 UT) tablet Take 1,000 Units by mouth daily.    . clorazepate (TRANXENE) 7.5 MG tablet Take 7.5 mg by mouth 2 (two) times daily as needed for anxiety.    . docusate sodium (COLACE) 100 MG capsule Take 100 mg by mouth 2 (two) times daily.    . ferrous sulfate 325 (65 FE) MG tablet Take 325 mg by mouth daily with breakfast.    . folic acid (FOLVITE) 1 MG tablet TAKE 1 TABLET BY MOUTH DAILY. 30 tablet 2  . guaiFENesin-codeine (CHERATUSSIN AC) 100-10 MG/5ML syrup Take 5 mLs by mouth every 6 (six) hours as needed for cough. 240 mL 0  . HYDROcodone-acetaminophen (NORCO/VICODIN) 5-325 MG tablet Take 1-2 tablets by mouth every 4 (four) hours as needed. 30 tablet 0  . ipratropium-albuterol (DUONEB) 0.5-2.5 (3) MG/3ML SOLN INHALE THE CONTENTS OF 1 VIAL VIA NEBULIZER 3 TIMES A DAY. 360 mL 0  . irbesartan-hydrochlorothiazide (AVALIDE) 150-12.5 MG tablet     .  levothyroxine (SYNTHROID, LEVOTHROID) 175 MCG tablet Take 175 mcg by mouth daily before breakfast.    . lidocaine-prilocaine (EMLA) cream Apply 1 application topically as needed. 30 g 0  . losartan-hydrochlorothiazide (HYZAAR) 50-12.5 MG per tablet Take 1 tablet by mouth every morning.     . metoprolol succinate (TOPROL-XL) 50 MG 24 hr tablet Take 50 mg by mouth 2 (two) times daily.     . Multiple Vitamins-Minerals (VISION FORMULA 2 PO) Take by mouth. OTC "Vision pills for Macular degeneration"    . prochlorperazine (COMPAZINE) 10 MG  tablet Take 1 tablet (10 mg total) by mouth every 6 (six) hours as needed for nausea or vomiting. 30 tablet 0  . rosuvastatin (CRESTOR) 10 MG tablet Take 10 mg by mouth every evening.     . SYMBICORT 160-4.5 MCG/ACT inhaler INHALE 2 PUFFS INTO THE LUNGS 2 TIMES DAILY. 10.2 g 6  . valACYclovir (VALTREX) 500 MG tablet Take 500 mg by mouth daily.    [name] 20 MG TABS tablet TAKE 1 TABLET (20 MG TOTAL) BY MOUTH DAILY WITH SUPPER. 30 tablet 1   No current facility-administered medications for this visit.    Facility-Administered Medications Ordered in Other Visits  Medication Dose Route Frequency Provider Last Rate Last Dose  . sodium chloride flush (NS) 0.9 % injection 10 mL  10 mL Intracatheter PRN Curt Bears, MD   10 mL at 06/07/18 1041    SURGICAL HISTORY:  Past Surgical History:  Procedure Laterality Date  . ABDOMINAL HYSTERECTOMY  2001  . GRADED EXERCISE TOLERANCE TEST  08/2015    Blood pressure demonstrated a hypertensive response to exercise.  There was less than 0.72mm of horizontal ST segment depression in the inferolateral leads. No diagnostic criterial for ischemia. Moderately impaired exercise tolerance. The patient achieved 5.8 mets. This is a low risk study.  . IR FLUORO GUIDE PORT INSERTION RIGHT  08/17/2017  . IR US GUIDE VASC ACCESS RIGHT  08/17/2017  . LOBECTOMY  2000   RLL  . NM MYOCAR PERF WALL MOTION  08/20/2007   EF 74%  EXERCISE CAPACITY 7 METS  . OOPHORECTOMY  2001  . TONSILLECTOMY    . VIDEO BRONCHOSCOPY Bilateral 07/20/2017   Procedure: VIDEO BRONCHOSCOPY WITH FLUORO;  Surgeon: Collene Gobble, MD;  Location: [facility];  Service: Cardiopulmonary;  Laterality: Bilateral;    REVIEW OF SYSTEMS:  A comprehensive review of systems was negative except for: Respiratory: positive for dyspnea on exertion   PHYSICAL EXAMINATION: General appearance: alert, cooperative, fatigued and no distress Head: Normocephalic, without obvious abnormality, atraumatic Neck: no  adenopathy, no JVD, supple, symmetrical, trachea midline and thyroid not enlarged, symmetric, no tenderness/mass/nodules Lymph nodes: Cervical, supraclavicular, and axillary nodes normal. Resp: clear to auscultation bilaterally Back: symmetric, no curvature. ROM normal. No CVA tenderness. Cardio: regular rate and rhythm, S1, S2 normal, no murmur, click, rub or gallop GI: soft, non-tender; bowel sounds normal; no masses,  no organomegaly Extremities: extremities normal, atraumatic, no cyanosis or edema  ECOG PERFORMANCE STATUS: 1 - Symptomatic but completely ambulatory  Blood pressure (!) 125/51, pulse 72, temperature 98.7 F (37.1 C), temperature source Oral, resp. rate 18, height 5\' 4"  (1.626 m), weight 160 lb 9.6 oz (72.8 kg), SpO2 100 %.  LABORATORY DATA: Lab Results  Component Value Date   WBC 7.9 05/16/2018   HGB 8.2 (L) 05/16/2018   HCT 26.9 (L) 05/16/2018   MCV 114.5 (H) 05/16/2018   PLT 291 05/16/2018      Chemistry  Component Value Date/Time   NA 146 (H) 05/16/2018 0908   K 3.5 05/16/2018 0908   CL 105 05/16/2018 0908   CO2 30 05/16/2018 0908   BUN 9 05/16/2018 0908   CREATININE 1.03 (H) 05/16/2018 0908      Component Value Date/Time   CALCIUM 9.0 05/16/2018 0908   ALKPHOS 58 05/16/2018 0908   AST 19 05/16/2018 0908   ALT 10 05/16/2018 0908   BILITOT 0.3 05/16/2018 0908       RADIOGRAPHIC STUDIES: Xr Knee 1-2 Views Right  Result Date: 06/05/2018 2 view radiographs of the right knee shows subchondral sclerosis with joint space narrowing medially on both knees.  Lateral radiograph shows mild spurring of the patella.  No subcondylar cysts.   ASSESSMENT AND PLAN: This is a very pleasant 78 years old white female with stage IV non-small cell lung cancer, adenocarcinoma with no actionable mutations. She is currently undergoing systemic chemotherapy with carboplatin, Alimta and Ketruda (pembrolizumab) status post 14 cycles.  Starting from cycle #5 she is on  maintenance treatment with Alimta and Ketruda (pembrolizumab). The patient has been tolerating this treatment fairly well. I recommended for her to proceed with cycle #15 today as scheduled. I will see her back for follow-up visit in 3 weeks for evaluation after repeating CT scan of the chest, abdomen and pelvis for restaging of her disease. For the chemotherapy-induced anemia, we will arrange for the patient to receive 2 units of PRBCs transfusions the next few days. 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 For the COPD, she will continue on home oxygen. She was advised to call immediately if she has any concerning symptoms in the interval. The patient voices understanding of current disease status and treatment options and is in agreement with the current care plan. All questions were answered. The patient knows to call the clinic with any problems, questions or concerns. We can certainly see the patient much sooner if necessary.  Disclaimer: This note was dictated with voice recognition software. Similar sounding words can inadvertently be transcribed and may not be corrected upon review.

## 2018-06-07 NOTE — Patient Instructions (Signed)
[facility] Discharge Instructions for Patients Receiving Chemotherapy  Today you received the following chemotherapy agents :  Keytruda,  Alimta.  To help prevent nausea and vomiting after your treatment, we encourage you to take your nausea medication as prescribed.   If you develop nausea and vomiting that is not controlled by your nausea medication, call the clinic.   BELOW ARE SYMPTOMS THAT SHOULD BE REPORTED IMMEDIATELY:  *FEVER GREATER THAN 100.5 F  *CHILLS WITH OR WITHOUT FEVER  NAUSEA AND VOMITING THAT IS NOT CONTROLLED WITH YOUR NAUSEA MEDICATION  *UNUSUAL SHORTNESS OF BREATH  *UNUSUAL BRUISING OR BLEEDING  TENDERNESS IN MOUTH AND THROAT WITH OR WITHOUT PRESENCE OF ULCERS  *URINARY PROBLEMS  *BOWEL PROBLEMS  UNUSUAL RASH Items with * indicate a potential emergency and should be followed up as soon as possible.  Feel free to call the clinic should you have any questions or concerns. The clinic phone number is [phone].  Please show the [facility] at check-in to the Emergency Department and triage nurse.

## 2018-06-13 ENCOUNTER — Ambulatory Visit: Payer: PPO | Admitting: Pulmonary Disease

## 2018-06-26 ENCOUNTER — Encounter (HOSPITAL_COMMUNITY): Payer: Self-pay

## 2018-06-26 ENCOUNTER — Ambulatory Visit (HOSPITAL_COMMUNITY)
Admission: RE | Admit: 2018-06-26 | Discharge: 2018-06-26 | Disposition: A | Discharge status: Home / Self Care | Payer: PPO | Source: Ambulatory Visit | Attending: Internal Medicine | Admitting: Internal Medicine

## 2018-06-26 ENCOUNTER — Other Ambulatory Visit: Payer: Self-pay

## 2018-06-26 DIAGNOSIS — C3490 Malignant neoplasm of unspecified part of unspecified bronchus or lung: Secondary | ICD-10-CM | POA: Insufficient documentation

## 2018-06-26 DIAGNOSIS — J90 Pleural effusion, not elsewhere classified: Secondary | ICD-10-CM | POA: Diagnosis not present

## 2018-06-26 MED ORDER — SODIUM CHLORIDE (PF) 0.9 % IJ SOLN
INTRAMUSCULAR | Status: AC
  Filled 2018-06-26: qty 50

## 2018-06-26 MED ORDER — IOHEXOL 300 MG/ML  SOLN
100.0000 mL | Freq: Once | INTRAMUSCULAR | Status: AC | PRN
  Administered 2018-06-26: 13:00:00 100 mL via INTRAVENOUS

## 2018-06-28 ENCOUNTER — Inpatient Hospital Stay: Payer: PPO

## 2018-06-28 ENCOUNTER — Inpatient Hospital Stay: Payer: PPO | Attending: Internal Medicine | Admitting: Internal Medicine

## 2018-06-28 ENCOUNTER — Encounter: Payer: Self-pay | Admitting: Internal Medicine

## 2018-06-28 ENCOUNTER — Inpatient Hospital Stay: Payer: PPO | Admitting: Nutrition

## 2018-06-28 ENCOUNTER — Other Ambulatory Visit: Payer: Self-pay

## 2018-06-28 VITALS — BP 137/48 | HR 81 | Temp 99.2°F | Resp 18 | Ht 64.0 in | Wt 138.7 lb

## 2018-06-28 DIAGNOSIS — I10 Essential (primary) hypertension: Secondary | ICD-10-CM | POA: Insufficient documentation

## 2018-06-28 DIAGNOSIS — Z5112 Encounter for antineoplastic immunotherapy: Secondary | ICD-10-CM

## 2018-06-28 DIAGNOSIS — R531 Weakness: Secondary | ICD-10-CM | POA: Insufficient documentation

## 2018-06-28 DIAGNOSIS — E669 Obesity, unspecified: Secondary | ICD-10-CM | POA: Diagnosis not present

## 2018-06-28 DIAGNOSIS — E119 Type 2 diabetes mellitus without complications: Secondary | ICD-10-CM | POA: Diagnosis not present

## 2018-06-28 DIAGNOSIS — G4730 Sleep apnea, unspecified: Secondary | ICD-10-CM | POA: Diagnosis not present

## 2018-06-28 DIAGNOSIS — F419 Anxiety disorder, unspecified: Secondary | ICD-10-CM | POA: Diagnosis not present

## 2018-06-28 DIAGNOSIS — C3491 Malignant neoplasm of unspecified part of right bronchus or lung: Secondary | ICD-10-CM

## 2018-06-28 DIAGNOSIS — Z452 Encounter for adjustment and management of vascular access device: Secondary | ICD-10-CM | POA: Insufficient documentation

## 2018-06-28 DIAGNOSIS — R5383 Other fatigue: Secondary | ICD-10-CM | POA: Insufficient documentation

## 2018-06-28 DIAGNOSIS — E785 Hyperlipidemia, unspecified: Secondary | ICD-10-CM | POA: Insufficient documentation

## 2018-06-28 DIAGNOSIS — E039 Hypothyroidism, unspecified: Secondary | ICD-10-CM | POA: Diagnosis not present

## 2018-06-28 DIAGNOSIS — Z95828 Presence of other vascular implants and grafts: Secondary | ICD-10-CM

## 2018-06-28 DIAGNOSIS — C3481 Malignant neoplasm of overlapping sites of right bronchus and lung: Secondary | ICD-10-CM | POA: Insufficient documentation

## 2018-06-28 DIAGNOSIS — D6481 Anemia due to antineoplastic chemotherapy: Secondary | ICD-10-CM

## 2018-06-28 DIAGNOSIS — Z79899 Other long term (current) drug therapy: Secondary | ICD-10-CM | POA: Diagnosis not present

## 2018-06-28 DIAGNOSIS — C3490 Malignant neoplasm of unspecified part of unspecified bronchus or lung: Secondary | ICD-10-CM

## 2018-06-28 DIAGNOSIS — J90 Pleural effusion, not elsewhere classified: Secondary | ICD-10-CM | POA: Insufficient documentation

## 2018-06-28 DIAGNOSIS — I2699 Other pulmonary embolism without acute cor pulmonale: Secondary | ICD-10-CM | POA: Insufficient documentation

## 2018-06-28 DIAGNOSIS — Z5111 Encounter for antineoplastic chemotherapy: Secondary | ICD-10-CM

## 2018-06-28 DIAGNOSIS — Z7189 Other specified counseling: Secondary | ICD-10-CM

## 2018-06-28 DIAGNOSIS — T451X5A Adverse effect of antineoplastic and immunosuppressive drugs, initial encounter: Secondary | ICD-10-CM

## 2018-06-28 LAB — CBC WITH DIFFERENTIAL (CANCER CENTER ONLY)
Abs Immature Granulocytes: 0.14 10*3/uL — ABNORMAL HIGH (ref 0.00–0.07)
Basophils Absolute: 0 10*3/uL (ref 0.0–0.1)
Basophils Relative: 0 %
Eosinophils Absolute: 0.2 10*3/uL (ref 0.0–0.5)
Eosinophils Relative: 2 %
HCT: 25.8 % — ABNORMAL LOW (ref 36.0–46.0)
Hemoglobin: 7.9 g/dL — ABNORMAL LOW (ref 12.0–15.0)
Immature Granulocytes: 1 %
Lymphocytes Relative: 17 %
Lymphs Abs: 1.6 10*3/uL (ref 0.7–4.0)
MCH: 34.3 pg — ABNORMAL HIGH (ref 26.0–34.0)
MCHC: 30.6 g/dL (ref 30.0–36.0)
MCV: 112.2 fL — ABNORMAL HIGH (ref 80.0–100.0)
Monocytes Absolute: 1.2 10*3/uL — ABNORMAL HIGH (ref 0.1–1.0)
Monocytes Relative: 12 %
Neutro Abs: 6.7 10*3/uL (ref 1.7–7.7)
Neutrophils Relative %: 68 %
Platelet Count: 285 10*3/uL (ref 150–400)
RBC: 2.3 MIL/uL — ABNORMAL LOW (ref 3.87–5.11)
RDW: 17.5 % — ABNORMAL HIGH (ref 11.5–15.5)
WBC Count: 9.8 10*3/uL (ref 4.0–10.5)
nRBC: 0 % (ref 0.0–0.2)

## 2018-06-28 LAB — CMP (CANCER CENTER ONLY)
ALT: 7 U/L (ref 0–44)
AST: 16 U/L (ref 15–41)
Albumin: 2.4 g/dL — ABNORMAL LOW (ref 3.5–5.0)
Alkaline Phosphatase: 69 U/L (ref 38–126)
Anion gap: 11 (ref 5–15)
BUN: 13 mg/dL (ref 8–23)
CO2: 28 mmol/L (ref 22–32)
Calcium: 9.1 mg/dL (ref 8.9–10.3)
Chloride: 106 mmol/L (ref 98–111)
Creatinine: 1.55 mg/dL — ABNORMAL HIGH (ref 0.44–1.00)
GFR, Est AFR Am: 38 mL/min — ABNORMAL LOW (ref >60)
GFR, Estimated: 32 mL/min — ABNORMAL LOW (ref >60)
Glucose, Bld: 106 mg/dL — ABNORMAL HIGH (ref 70–99)
Potassium: 4 mmol/L (ref 3.5–5.1)
Sodium: 145 mmol/L (ref 135–145)
Total Bilirubin: <0.2 mg/dL — ABNORMAL LOW (ref 0.3–1.2)
Total Protein: 6.4 g/dL — ABNORMAL LOW (ref 6.5–8.1)

## 2018-06-28 LAB — TSH: TSH: 2.159 u[IU]/mL (ref 0.308–3.960)

## 2018-06-28 MED ORDER — SODIUM CHLORIDE 0.9% FLUSH
10.0000 mL | INTRAVENOUS | Status: DC | PRN
  Administered 2018-06-28: 11:00:00 10 mL via INTRAVENOUS
  Filled 2018-06-28: qty 10

## 2018-06-28 MED ORDER — DEXAMETHASONE SODIUM PHOSPHATE 10 MG/ML IJ SOLN
INTRAMUSCULAR | Status: AC
  Filled 2018-06-28: qty 1

## 2018-06-28 MED ORDER — SODIUM CHLORIDE 0.9% FLUSH
10.0000 mL | INTRAVENOUS | Status: DC | PRN
  Administered 2018-06-28: 10:00:00 10 mL
  Filled 2018-06-28: qty 10

## 2018-06-28 MED ORDER — HEPARIN SOD (PORK) LOCK FLUSH 100 UNIT/ML IV SOLN
500.0000 [IU] | Freq: Once | INTRAVENOUS | Status: AC
  Administered 2018-06-28: 500 [IU] via INTRAVENOUS
  Filled 2018-06-28: qty 5

## 2018-06-28 MED ORDER — ONDANSETRON HCL 4 MG/2ML IJ SOLN
INTRAMUSCULAR | Status: AC
  Filled 2018-06-28: qty 4

## 2018-06-28 NOTE — Patient Instructions (Signed)

## 2018-06-28 NOTE — Progress Notes (Signed)
[place] [phone]   [phone]) [phone]  OFFICE PROGRESS NOTE  [name], MD [place] [address]  DIAGNOSIS:  1) Stage IV (T3, N0, M1 a)non-small cell lung cancer, well-differentiated adenocarcinoma presented with multifocal disease involving mainly the right upper lobe as well as the right middle lobe and left lower lobe of the lung diagnosed in May 2019. 2) bilateral small pulmonary emboli diagnosed incidentally on CT scan of the chest on October 16, 2017  Guardant 360 testing was negative for actionable mutations.  PRIOR THERAPY:None  CURRENT THERAPY: 1) Carboplatin for an AUC of 5, Alimta 500 mg/m2 and Keytruda 200 mg IV given every 3 weeks. First dosegiven on 08/18/2017.Status post 15 cycles.  Starting from cycle #5 the patient is on maintenance Alimta and Ketruda (pembrolizumab) every 3 weeks. 2) Xarelto 15 mg p.o. twice daily for the first 3 weeks followed by 20 mg p.o. Daily.  INTERVAL HISTORY: Erin Myers 78 y.o. female returns to the clinic today for follow-up visit.  The patient is feeling fine today with no concerning complaints except for increasing fatigue and weakness with her chemotherapy.  She also complaining of shortness of breath at baseline increased with exertion but no significant chest pain or hemoptysis.  She has mild cough.  She denied having any nausea, vomiting, diarrhea or constipation but she lost over 20 pounds in the last few weeks because of lack of appetite.  She has been tolerating her chemotherapy well except for the increasing fatigue and weakness.  She denied having any headache or visual changes.  She had repeat CT scan of the chest, abdomen and pelvis performed recently and she is here for evaluation and discussion of her scan results.  MEDICAL HISTORY: Past Medical History:  Diagnosis Date   Anxiety    Diabetes mellitus without complication ([place])    Dyslipidemia     HTN (hypertension)    Hyperlipemia    Hypothyroid    Hypothyroidism    Lung cancer ([place]) 2000   Carcinoid, s/p RLL lobectomy   Obesity    Ovarian cancer ([place])    Palpitations 2009 and 2006   Negative nuclear stress test    Sleep apnea    on C-pap    ALLERGIES:  is allergic to adhesive [tape]; codeine; lactose intolerance (gi); monosodium glutamate; and sulfa antibiotics.  MEDICATIONS:  Current Outpatient Medications  Medication Sig Dispense Refill   acetaminophen (TYLENOL) 500 MG tablet Take 500 mg by mouth every 6 (six) hours as needed.     benzonatate (TESSALON) 200 MG capsule Take 1 capsule (200 mg total) by mouth 3 (three) times daily as needed for cough. 30 capsule 1   cholecalciferol (VITAMIN D3) 25 MCG (1000 UT) tablet Take 1,000 Units by mouth daily.     clorazepate (TRANXENE) 7.5 MG tablet Take 7.5 mg by mouth 2 (two) times daily as needed for anxiety.     docusate sodium (COLACE) 100 MG capsule Take 100 mg by mouth 2 (two) times daily.     ferrous sulfate 325 (65 FE) MG tablet Take 325 mg by mouth daily with breakfast.     folic acid (FOLVITE) 1 MG tablet TAKE 1 TABLET BY MOUTH DAILY. 30 tablet 2   guaiFENesin-codeine (CHERATUSSIN AC) 100-10 MG/5ML syrup Take 5 mLs by mouth every 6 (six) hours as needed for cough. 240 mL 0   HYDROcodone-acetaminophen (NORCO/VICODIN) 5-325 MG tablet Take 1-2 tablets by mouth every 4 (four) hours  as needed. 30 tablet 0   ipratropium-albuterol (DUONEB) 0.5-2.5 (3) MG/3ML SOLN INHALE THE CONTENTS OF 1 VIAL VIA NEBULIZER 3 TIMES A DAY. 360 mL 0   irbesartan-hydrochlorothiazide (AVALIDE) 150-12.5 MG tablet      levothyroxine (SYNTHROID, LEVOTHROID) 175 MCG tablet Take 175 mcg by mouth daily before breakfast.     lidocaine-prilocaine (EMLA) cream Apply 1 application topically as needed. 30 g 0   losartan-hydrochlorothiazide (HYZAAR) 50-12.5 MG per tablet Take 1 tablet by mouth every morning.      metoprolol succinate  (TOPROL-XL) 50 MG 24 hr tablet Take 50 mg by mouth 2 (two) times daily.      Multiple Vitamins-Minerals (VISION FORMULA 2 PO) Take by mouth. OTC "Vision pills for Macular degeneration"     prochlorperazine (COMPAZINE) 10 MG tablet Take 1 tablet (10 mg total) by mouth every 6 (six) hours as needed for nausea or vomiting. 30 tablet 0   rosuvastatin (CRESTOR) 10 MG tablet Take 10 mg by mouth every evening.      SYMBICORT 160-4.5 MCG/ACT inhaler INHALE 2 PUFFS INTO THE LUNGS 2 TIMES DAILY. 10.2 g 6   valACYclovir (VALTREX) 500 MG tablet Take 500 mg by mouth daily.     XARELTO 20 MG TABS tablet TAKE 1 TABLET (20 MG TOTAL) BY MOUTH DAILY WITH SUPPER. 30 tablet 1   No current facility-administered medications for this visit.    Facility-Administered Medications Ordered in Other Visits  Medication Dose Route Frequency Provider Last Rate Last Dose   sodium chloride flush (NS) 0.9 % injection 10 mL  10 mL Intracatheter PRN Curt Bears, MD   10 mL at 06/28/18 9833    SURGICAL HISTORY:  Past Surgical History:  Procedure Laterality Date   ABDOMINAL HYSTERECTOMY  2001   GRADED EXERCISE TOLERANCE TEST  08/2015    Blood pressure demonstrated a hypertensive response to exercise.  There was less than 0.36mm of horizontal ST segment depression in the inferolateral leads. No diagnostic criterial for ischemia. Moderately impaired exercise tolerance. The patient achieved 5.8 mets. This is a low risk study.   IR FLUORO GUIDE PORT INSERTION RIGHT  08/17/2017   IR US GUIDE VASC ACCESS RIGHT  08/17/2017   LOBECTOMY  2000   RLL   NM MYOCAR PERF WALL MOTION  08/20/2007   EF 74%  EXERCISE CAPACITY 7 METS   OOPHORECTOMY  2001   TONSILLECTOMY     VIDEO BRONCHOSCOPY Bilateral 07/20/2017   Procedure: VIDEO BRONCHOSCOPY WITH FLUORO;  Surgeon: Collene Gobble, MD;  Location: [facility];  Service: Cardiopulmonary;  Laterality: Bilateral;    REVIEW OF SYSTEMS:  Constitutional: positive for anorexia,  fatigue and weight loss Eyes: negative Ears, nose, mouth, throat, and face: negative Respiratory: positive for cough and dyspnea on exertion Cardiovascular: negative Gastrointestinal: negative Genitourinary:negative Integument/breast: negative Hematologic/lymphatic: negative Musculoskeletal:positive for muscle weakness Neurological: negative Behavioral/Psych: negative Endocrine: negative Allergic/Immunologic: negative   PHYSICAL EXAMINATION: General appearance: alert, cooperative, fatigued and no distress Head: Normocephalic, without obvious abnormality, atraumatic Neck: no adenopathy, no JVD, supple, symmetrical, trachea midline and thyroid not enlarged, symmetric, no tenderness/mass/nodules Lymph nodes: Cervical, supraclavicular, and axillary nodes normal. Resp: clear to auscultation bilaterally Back: symmetric, no curvature. ROM normal. No CVA tenderness. Cardio: regular rate and rhythm, S1, S2 normal, no murmur, click, rub or gallop GI: soft, non-tender; bowel sounds normal; no masses,  no organomegaly Extremities: extremities normal, atraumatic, no cyanosis or edema Neurologic: Alert and oriented X 3, normal strength and tone. Normal symmetric reflexes. Normal coordination and  gait  ECOG PERFORMANCE STATUS: 2 - Symptomatic, <50% confined to bed  Blood pressure (!) 137/48, pulse 81, temperature 99.2 F (37.3 C), temperature source Oral, resp. rate 18, height 5\' 4"  (1.626 m), weight 138 lb 11.2 oz (62.9 kg), SpO2 100 %.  LABORATORY DATA: Lab Results  Component Value Date   WBC 9.8 06/28/2018   HGB 7.9 (L) 06/28/2018   HCT 25.8 (L) 06/28/2018   MCV 112.2 (H) 06/28/2018   PLT 285 06/28/2018      Chemistry      Component Value Date/Time   NA 145 06/07/2018 1004   K 3.8 06/07/2018 1004   CL 106 06/07/2018 1004   CO2 29 06/07/2018 1004   BUN 13 06/07/2018 1004   CREATININE 1.15 (H) 06/07/2018 1004      Component Value Date/Time   CALCIUM 9.1 06/07/2018 1004    ALKPHOS 59 06/07/2018 1004   AST 19 06/07/2018 1004   ALT 15 06/07/2018 1004   BILITOT 0.2 (L) 06/07/2018 1004       RADIOGRAPHIC STUDIES: Ct Chest W Contrast  Result Date: 06/26/2018 CLINICAL DATA:  Restaging lung cancer EXAM: CT CHEST, ABDOMEN, AND PELVIS WITH CONTRAST TECHNIQUE: Multidetector CT imaging of the chest, abdomen and pelvis was performed following the standard protocol during bolus administration of intravenous contrast. CONTRAST:  11mL OMNIPAQUE IOHEXOL 300 MG/ML SOLN, additional oral enteric contrast COMPARISON:  04/23/2018, 02/20/2018 FINDINGS: CT CHEST FINDINGS Cardiovascular: Aortic atherosclerosis. Normal heart size. No pericardial effusion. Right internal jugular port catheter. Mediastinum/Nodes: No pathologically enlarged mediastinal, hilar, or axillary lymph nodes. Thyroid gland, trachea, and esophagus demonstrate no significant findings. Lungs/Pleura: Status post wedge resection of the right middle lobe with no significant change in heterogeneous and ground-glass opacity of the right upper lobe. Moderate right pleural effusion, significantly increased compared to prior examination. Musculoskeletal: No chest wall mass or suspicious bone lesions identified. CT ABDOMEN PELVIS FINDINGS Hepatobiliary: No focal liver abnormality is seen. No gallstones, gallbladder wall thickening, or biliary dilatation. Pancreas: Unremarkable. No pancreatic ductal dilatation or surrounding inflammatory changes. Spleen: Normal in size without focal abnormality. Adrenals/Urinary Tract: Adrenal glands are unremarkable. Kidneys are normal, without renal calculi, focal lesion, or hydronephrosis. Bladder is unremarkable. Stomach/Bowel: Stomach is within normal limits. Appendix appears normal. No evidence of bowel wall thickening, distention, or inflammatory changes. Vascular/Lymphatic: Mixed calcific atherosclerosis. No enlarged abdominal or pelvic lymph nodes. Reproductive: No mass or other abnormality.  Status post hysterectomy. Other: No abdominal wall hernia or abnormality. No abdominopelvic ascites. Musculoskeletal: No acute or significant osseous findings. IMPRESSION: 1. Status post wedge resection of the right middle lobe with no significant change in heterogeneous and ground-glass opacity of the right upper lobe. 2. Moderate right pleural effusion, significantly increased compared to prior examination. 3.  No evidence of metastatic disease in the abdomen or pelvis. 4.  Chronic and incidental findings as detailed above. Electronically Signed   By: Eddie Candle M.D.   On: 06/26/2018 15:49   Ct Abdomen Pelvis W Contrast  Result Date: 06/26/2018 CLINICAL DATA:  Restaging lung cancer EXAM: CT CHEST, ABDOMEN, AND PELVIS WITH CONTRAST TECHNIQUE: Multidetector CT imaging of the chest, abdomen and pelvis was performed following the standard protocol during bolus administration of intravenous contrast. CONTRAST:  150mL OMNIPAQUE IOHEXOL 300 MG/ML SOLN, additional oral enteric contrast COMPARISON:  04/23/2018, 02/20/2018 FINDINGS: CT CHEST FINDINGS Cardiovascular: Aortic atherosclerosis. Normal heart size. No pericardial effusion. Right internal jugular port catheter. Mediastinum/Nodes: No pathologically enlarged mediastinal, hilar, or axillary lymph nodes. Thyroid gland, trachea, and  esophagus demonstrate no significant findings. Lungs/Pleura: Status post wedge resection of the right middle lobe with no significant change in heterogeneous and ground-glass opacity of the right upper lobe. Moderate right pleural effusion, significantly increased compared to prior examination. Musculoskeletal: No chest wall mass or suspicious bone lesions identified. CT ABDOMEN PELVIS FINDINGS Hepatobiliary: No focal liver abnormality is seen. No gallstones, gallbladder wall thickening, or biliary dilatation. Pancreas: Unremarkable. No pancreatic ductal dilatation or surrounding inflammatory changes. Spleen: Normal in size without  focal abnormality. Adrenals/Urinary Tract: Adrenal glands are unremarkable. Kidneys are normal, without renal calculi, focal lesion, or hydronephrosis. Bladder is unremarkable. Stomach/Bowel: Stomach is within normal limits. Appendix appears normal. No evidence of bowel wall thickening, distention, or inflammatory changes. Vascular/Lymphatic: Mixed calcific atherosclerosis. No enlarged abdominal or pelvic lymph nodes. Reproductive: No mass or other abnormality. Status post hysterectomy. Other: No abdominal wall hernia or abnormality. No abdominopelvic ascites. Musculoskeletal: No acute or significant osseous findings. IMPRESSION: 1. Status post wedge resection of the right middle lobe with no significant change in heterogeneous and ground-glass opacity of the right upper lobe. 2. Moderate right pleural effusion, significantly increased compared to prior examination. 3.  No evidence of metastatic disease in the abdomen or pelvis. 4.  Chronic and incidental findings as detailed above. Electronically Signed   By: Eddie Candle M.D.   On: 06/26/2018 15:49   Xr Knee 1-2 Views Right  Result Date: 06/05/2018 2 view radiographs of the right knee shows subchondral sclerosis with joint space narrowing medially on both knees.  Lateral radiograph shows mild spurring of the patella.  No subcondylar cysts.   ASSESSMENT AND PLAN: This is a very pleasant 78 years old white female with stage IV non-small cell lung cancer, adenocarcinoma with no actionable mutations. She is currently undergoing systemic chemotherapy with carboplatin, Alimta and Ketruda (pembrolizumab) status post 15 cycles.  Starting from cycle #5 she is on maintenance treatment with Alimta and Ketruda (pembrolizumab). The patient has been tolerating this treatment well but recently she has more fatigue, weakness as well as weight loss. She had repeat CT scan of the chest, abdomen and pelvis performed recently.  I personally and independently reviewed the  scans and discussed the results with the patient today. Her scan showed no concerning findings for disease progression except for enlarging right pleural effusion. I had a lengthy discussion with the patient today about her current disease stage, prognosis and treatment options. I gave the patient the option of taking a chemo break for the next 2 months until improvement of her condition versus continues treatment with the same regimen. The patient is complaining of increasing fatigue and weakness and she was interested in taking the break especially with the stable scan at this point. I will see her back for follow-up visit in 2 months with repeat CT scan of the chest, abdomen and pelvis. For the enlarging right pleural effusion, I will arrange for the patient to have ultrasound-guided right thoracentesis for drainage of the right pleural effusion. She was advised to call immediately if she has any concerning symptoms in the interval. The patient voices understanding of current disease status and treatment options and is in agreement with the current care plan. All questions were answered. The patient knows to call the clinic with any problems, questions or concerns. We can certainly see the patient much sooner if necessary.  Disclaimer: This note was dictated with voice recognition software. Similar sounding words can inadvertently be transcribed and may not be corrected upon review.

## 2018-06-28 NOTE — Progress Notes (Addendum)
RD working remotely.  Nutrition follow up completed with patient. She reports she is on a break from chemotherapy for lung cancer. Reports she has a very poor appetite. Tries to drink [employer]. Reports lactose intolerance. Is wiling to try Ensure Enlive. Weight documented as 138.7 pounds today, decreased from 160.6 pounds on March 26.  Nutrition Diagnosis: Unintended weight loss continues.  Intervention: Educated patient on strategies to add calories and protein in small frequent meals and snacks. Recommended Ensure Enlive TID between meals. Will mail coupons. Will email fact sheets on improving poor appetite and strategies for increased calories. Patient has my contact information for questions. Encouraged her to contact me if she would like a complimentary case of Ensure Enlive.  Monitoring, Evaluation, Goals: Increase calories and protein to minimize further weight loss and improve strength.  Next Visit: Patient will contact for questions.

## 2018-06-29 ENCOUNTER — Telehealth: Payer: Self-pay | Admitting: Internal Medicine

## 2018-06-29 NOTE — Telephone Encounter (Signed)
Called regarding schedule °

## 2018-07-02 ENCOUNTER — Other Ambulatory Visit: Payer: Self-pay | Admitting: Pulmonary Disease

## 2018-07-02 ENCOUNTER — Other Ambulatory Visit: Payer: Self-pay | Admitting: *Deleted

## 2018-07-02 ENCOUNTER — Telehealth: Payer: Self-pay | Admitting: *Deleted

## 2018-07-02 DIAGNOSIS — C3491 Malignant neoplasm of unspecified part of right bronchus or lung: Secondary | ICD-10-CM

## 2018-07-02 NOTE — Telephone Encounter (Signed)
TCT patient regarding thoracentesis. Pt is scheduled for Wednesday @ [facility] @ 11am with arrival time of 10:45. Pt is to hold her Xarelto for 24 hours prior to scan. Pt voiced understanding. Also informed her that her scans for June have been scheduled. Date and time given for that. Pt voiced appreciation.

## 2018-07-02 NOTE — Telephone Encounter (Signed)
Received call from patient regarding her appt with Dr. [name] last week. She was told to expect a prescription to be called in to [facility] for a steroid dosepak (from her description) and also she is asking about the thoracentesis Dr. [name] ordered.  This RN will contact [facility].  Please advise about steroids.

## 2018-07-04 ENCOUNTER — Encounter: Payer: Self-pay | Admitting: Nutrition

## 2018-07-04 ENCOUNTER — Ambulatory Visit (HOSPITAL_COMMUNITY)
Admission: RE | Admit: 2018-07-04 | Discharge: 2018-07-04 | Disposition: A | Discharge status: Home / Self Care | Payer: PPO | Source: Ambulatory Visit | Attending: Internal Medicine | Admitting: Internal Medicine

## 2018-07-04 ENCOUNTER — Ambulatory Visit (HOSPITAL_COMMUNITY)
Admission: RE | Admit: 2018-07-04 | Discharge: 2018-07-04 | Disposition: A | Discharge status: Home / Self Care | Payer: PPO | Source: Ambulatory Visit | Attending: Radiology | Admitting: Radiology

## 2018-07-04 ENCOUNTER — Other Ambulatory Visit (HOSPITAL_COMMUNITY): Payer: Self-pay | Admitting: Radiology

## 2018-07-04 ENCOUNTER — Other Ambulatory Visit: Payer: Self-pay

## 2018-07-04 ENCOUNTER — Encounter (HOSPITAL_COMMUNITY): Payer: Self-pay | Admitting: Radiology

## 2018-07-04 DIAGNOSIS — J90 Pleural effusion, not elsewhere classified: Secondary | ICD-10-CM

## 2018-07-04 DIAGNOSIS — Z9889 Other specified postprocedural states: Secondary | ICD-10-CM

## 2018-07-04 DIAGNOSIS — C3490 Malignant neoplasm of unspecified part of unspecified bronchus or lung: Secondary | ICD-10-CM

## 2018-07-04 HISTORY — PX: IR THORACENTESIS ASP PLEURAL SPACE W/IMG GUIDE: IMG5380

## 2018-07-04 MED ORDER — LIDOCAINE HCL 1 % IJ SOLN
INTRAMUSCULAR | Status: AC
  Filled 2018-07-04: qty 20

## 2018-07-04 MED ORDER — LIDOCAINE HCL (PF) 1 % IJ SOLN
INTRAMUSCULAR | Status: DC | PRN
  Administered 2018-07-04: 10 mL

## 2018-07-04 NOTE — Procedures (Addendum)
Ultrasound-guided  therapeutic right thoracentesis performed yielding 850 cc of hazy,amber fluid. No immediate complications. Follow-up chest x-ray pending.EBL< 1 cc. Due to pt chest discomfort and coughing only the above amount of fluid was removed today.

## 2018-07-04 NOTE — Progress Notes (Signed)
Provided one complimentary case of Ensure Enlive.

## 2018-07-05 ENCOUNTER — Other Ambulatory Visit: Payer: Self-pay | Admitting: *Deleted

## 2018-07-05 DIAGNOSIS — R739 Hyperglycemia, unspecified: Secondary | ICD-10-CM | POA: Diagnosis not present

## 2018-07-05 DIAGNOSIS — C3491 Malignant neoplasm of unspecified part of right bronchus or lung: Secondary | ICD-10-CM

## 2018-07-05 DIAGNOSIS — E038 Other specified hypothyroidism: Secondary | ICD-10-CM | POA: Diagnosis not present

## 2018-07-05 DIAGNOSIS — E7849 Other hyperlipidemia: Secondary | ICD-10-CM | POA: Diagnosis not present

## 2018-07-05 DIAGNOSIS — I10 Essential (primary) hypertension: Secondary | ICD-10-CM | POA: Diagnosis not present

## 2018-07-05 MED ORDER — METHYLPREDNISOLONE 4 MG PO TBPK: ORAL_TABLET | ORAL | 0 refills | Status: DC

## 2018-07-08 DIAGNOSIS — J449 Chronic obstructive pulmonary disease, unspecified: Secondary | ICD-10-CM | POA: Diagnosis not present

## 2018-07-09 DIAGNOSIS — I10 Essential (primary) hypertension: Secondary | ICD-10-CM | POA: Diagnosis not present

## 2018-07-09 DIAGNOSIS — R82998 Other abnormal findings in urine: Secondary | ICD-10-CM | POA: Diagnosis not present

## 2018-07-12 DIAGNOSIS — M545 Low back pain: Secondary | ICD-10-CM | POA: Diagnosis not present

## 2018-07-12 DIAGNOSIS — Z0000 Encounter for general adult medical examination without abnormal findings: Secondary | ICD-10-CM | POA: Diagnosis not present

## 2018-07-12 DIAGNOSIS — R002 Palpitations: Secondary | ICD-10-CM | POA: Diagnosis not present

## 2018-07-12 DIAGNOSIS — R739 Hyperglycemia, unspecified: Secondary | ICD-10-CM | POA: Diagnosis not present

## 2018-07-12 DIAGNOSIS — C3431 Malignant neoplasm of lower lobe, right bronchus or lung: Secondary | ICD-10-CM | POA: Diagnosis not present

## 2018-07-12 DIAGNOSIS — D649 Anemia, unspecified: Secondary | ICD-10-CM | POA: Diagnosis not present

## 2018-07-12 DIAGNOSIS — R7989 Other specified abnormal findings of blood chemistry: Secondary | ICD-10-CM | POA: Diagnosis not present

## 2018-07-12 DIAGNOSIS — E785 Hyperlipidemia, unspecified: Secondary | ICD-10-CM | POA: Diagnosis not present

## 2018-07-12 DIAGNOSIS — G4733 Obstructive sleep apnea (adult) (pediatric): Secondary | ICD-10-CM | POA: Diagnosis not present

## 2018-07-12 DIAGNOSIS — Z1331 Encounter for screening for depression: Secondary | ICD-10-CM | POA: Diagnosis not present

## 2018-07-12 DIAGNOSIS — Z1339 Encounter for screening examination for other mental health and behavioral disorders: Secondary | ICD-10-CM | POA: Diagnosis not present

## 2018-07-12 DIAGNOSIS — R209 Unspecified disturbances of skin sensation: Secondary | ICD-10-CM | POA: Diagnosis not present

## 2018-07-12 DIAGNOSIS — I10 Essential (primary) hypertension: Secondary | ICD-10-CM | POA: Diagnosis not present

## 2018-07-12 DIAGNOSIS — E039 Hypothyroidism, unspecified: Secondary | ICD-10-CM | POA: Diagnosis not present

## 2018-07-16 ENCOUNTER — Other Ambulatory Visit: Payer: Self-pay | Admitting: Internal Medicine

## 2018-07-16 DIAGNOSIS — I2699 Other pulmonary embolism without acute cor pulmonale: Secondary | ICD-10-CM

## 2018-07-19 ENCOUNTER — Other Ambulatory Visit: Payer: PPO

## 2018-07-19 ENCOUNTER — Ambulatory Visit: Payer: PPO

## 2018-07-19 ENCOUNTER — Ambulatory Visit: Payer: PPO | Admitting: Internal Medicine

## 2018-08-07 ENCOUNTER — Other Ambulatory Visit: Payer: Self-pay | Admitting: Internal Medicine

## 2018-08-07 DIAGNOSIS — J449 Chronic obstructive pulmonary disease, unspecified: Secondary | ICD-10-CM | POA: Diagnosis not present

## 2018-08-09 ENCOUNTER — Other Ambulatory Visit: Payer: PPO

## 2018-08-09 ENCOUNTER — Ambulatory Visit: Payer: PPO | Admitting: Internal Medicine

## 2018-08-09 ENCOUNTER — Ambulatory Visit: Payer: PPO

## 2018-08-20 ENCOUNTER — Other Ambulatory Visit: Payer: Self-pay | Admitting: Medical Oncology

## 2018-08-20 DIAGNOSIS — C3491 Malignant neoplasm of unspecified part of right bronchus or lung: Secondary | ICD-10-CM

## 2018-08-23 ENCOUNTER — Encounter (HOSPITAL_COMMUNITY): Payer: Self-pay

## 2018-08-23 ENCOUNTER — Other Ambulatory Visit: Payer: PPO

## 2018-08-23 ENCOUNTER — Telehealth: Payer: Self-pay | Admitting: *Deleted

## 2018-08-23 ENCOUNTER — Ambulatory Visit (HOSPITAL_COMMUNITY)
Admission: RE | Admit: 2018-08-23 | Discharge: 2018-08-23 | Disposition: A | Discharge status: Home / Self Care | Payer: PPO | Source: Ambulatory Visit | Attending: Internal Medicine | Admitting: Internal Medicine

## 2018-08-23 ENCOUNTER — Inpatient Hospital Stay: Payer: PPO | Attending: Internal Medicine

## 2018-08-23 ENCOUNTER — Other Ambulatory Visit: Payer: Self-pay | Admitting: *Deleted

## 2018-08-23 ENCOUNTER — Other Ambulatory Visit: Payer: Self-pay

## 2018-08-23 DIAGNOSIS — Z8543 Personal history of malignant neoplasm of ovary: Secondary | ICD-10-CM | POA: Insufficient documentation

## 2018-08-23 DIAGNOSIS — I700 Atherosclerosis of aorta: Secondary | ICD-10-CM | POA: Insufficient documentation

## 2018-08-23 DIAGNOSIS — R5383 Other fatigue: Secondary | ICD-10-CM | POA: Insufficient documentation

## 2018-08-23 DIAGNOSIS — R531 Weakness: Secondary | ICD-10-CM | POA: Insufficient documentation

## 2018-08-23 DIAGNOSIS — C3491 Malignant neoplasm of unspecified part of right bronchus or lung: Secondary | ICD-10-CM | POA: Diagnosis not present

## 2018-08-23 DIAGNOSIS — C3490 Malignant neoplasm of unspecified part of unspecified bronchus or lung: Secondary | ICD-10-CM | POA: Insufficient documentation

## 2018-08-23 DIAGNOSIS — I10 Essential (primary) hypertension: Secondary | ICD-10-CM | POA: Insufficient documentation

## 2018-08-23 DIAGNOSIS — E669 Obesity, unspecified: Secondary | ICD-10-CM | POA: Insufficient documentation

## 2018-08-23 DIAGNOSIS — G4730 Sleep apnea, unspecified: Secondary | ICD-10-CM | POA: Diagnosis not present

## 2018-08-23 DIAGNOSIS — E119 Type 2 diabetes mellitus without complications: Secondary | ICD-10-CM | POA: Diagnosis not present

## 2018-08-23 DIAGNOSIS — Z79899 Other long term (current) drug therapy: Secondary | ICD-10-CM | POA: Diagnosis not present

## 2018-08-23 DIAGNOSIS — J90 Pleural effusion, not elsewhere classified: Secondary | ICD-10-CM | POA: Diagnosis not present

## 2018-08-23 DIAGNOSIS — E039 Hypothyroidism, unspecified: Secondary | ICD-10-CM | POA: Diagnosis not present

## 2018-08-23 DIAGNOSIS — D638 Anemia in other chronic diseases classified elsewhere: Secondary | ICD-10-CM | POA: Diagnosis not present

## 2018-08-23 DIAGNOSIS — C3481 Malignant neoplasm of overlapping sites of right bronchus and lung: Secondary | ICD-10-CM | POA: Insufficient documentation

## 2018-08-23 DIAGNOSIS — Z86711 Personal history of pulmonary embolism: Secondary | ICD-10-CM | POA: Insufficient documentation

## 2018-08-23 DIAGNOSIS — Z9221 Personal history of antineoplastic chemotherapy: Secondary | ICD-10-CM | POA: Diagnosis not present

## 2018-08-23 DIAGNOSIS — F419 Anxiety disorder, unspecified: Secondary | ICD-10-CM | POA: Diagnosis not present

## 2018-08-23 DIAGNOSIS — E785 Hyperlipidemia, unspecified: Secondary | ICD-10-CM | POA: Insufficient documentation

## 2018-08-23 DIAGNOSIS — Z7901 Long term (current) use of anticoagulants: Secondary | ICD-10-CM | POA: Diagnosis not present

## 2018-08-23 DIAGNOSIS — N289 Disorder of kidney and ureter, unspecified: Secondary | ICD-10-CM | POA: Diagnosis not present

## 2018-08-23 LAB — TSH: TSH: 0.098 u[IU]/mL — ABNORMAL LOW (ref 0.308–3.960)

## 2018-08-23 LAB — CMP (CANCER CENTER ONLY)
ALT: 8 U/L (ref 0–44)
AST: 13 U/L — ABNORMAL LOW (ref 15–41)
Albumin: 2.9 g/dL — ABNORMAL LOW (ref 3.5–5.0)
Alkaline Phosphatase: 58 U/L (ref 38–126)
Anion gap: 8 (ref 5–15)
BUN: 17 mg/dL (ref 8–23)
CO2: 30 mmol/L (ref 22–32)
Calcium: 8.6 mg/dL — ABNORMAL LOW (ref 8.9–10.3)
Chloride: 105 mmol/L (ref 98–111)
Creatinine: 1.39 mg/dL — ABNORMAL HIGH (ref 0.44–1.00)
GFR, Est AFR Am: 43 mL/min — ABNORMAL LOW (ref >60)
GFR, Estimated: 37 mL/min — ABNORMAL LOW (ref >60)
Glucose, Bld: 82 mg/dL (ref 70–99)
Potassium: 4.2 mmol/L (ref 3.5–5.1)
Sodium: 143 mmol/L (ref 135–145)
Total Bilirubin: 0.3 mg/dL (ref 0.3–1.2)
Total Protein: 6.5 g/dL (ref 6.5–8.1)

## 2018-08-23 LAB — CBC WITH DIFFERENTIAL (CANCER CENTER ONLY)
Abs Immature Granulocytes: 0.02 10*3/uL (ref 0.00–0.07)
Basophils Absolute: 0 10*3/uL (ref 0.0–0.1)
Basophils Relative: 1 %
Eosinophils Absolute: 0.2 10*3/uL (ref 0.0–0.5)
Eosinophils Relative: 3 %
HCT: 26.6 % — ABNORMAL LOW (ref 36.0–46.0)
Hemoglobin: 8 g/dL — ABNORMAL LOW (ref 12.0–15.0)
Immature Granulocytes: 0 %
Lymphocytes Relative: 21 %
Lymphs Abs: 1.6 10*3/uL (ref 0.7–4.0)
MCH: 34.9 pg — ABNORMAL HIGH (ref 26.0–34.0)
MCHC: 30.1 g/dL (ref 30.0–36.0)
MCV: 116.2 fL — ABNORMAL HIGH (ref 80.0–100.0)
Monocytes Absolute: 0.6 10*3/uL (ref 0.1–1.0)
Monocytes Relative: 7 %
Neutro Abs: 5.2 10*3/uL (ref 1.7–7.7)
Neutrophils Relative %: 68 %
Platelet Count: 201 10*3/uL (ref 150–400)
RBC: 2.29 MIL/uL — ABNORMAL LOW (ref 3.87–5.11)
RDW: 13.8 % (ref 11.5–15.5)
WBC Count: 7.6 10*3/uL (ref 4.0–10.5)
nRBC: 0 % (ref 0.0–0.2)

## 2018-08-23 MED ORDER — IOHEXOL 300 MG/ML  SOLN
75.0000 mL | Freq: Once | INTRAMUSCULAR | Status: AC | PRN
  Administered 2018-08-23: 75 mL via INTRAVENOUS

## 2018-08-23 MED ORDER — SODIUM CHLORIDE (PF) 0.9 % IJ SOLN
INTRAMUSCULAR | Status: AC
  Filled 2018-08-23: qty 50

## 2018-08-23 NOTE — Telephone Encounter (Signed)
Received call from [facility] in Radiology. Pt needs Labs done prior to her CT scan today.  No labs since 06/2018. Appt made for labs prior to CT scan

## 2018-08-28 ENCOUNTER — Encounter: Payer: Self-pay | Admitting: Internal Medicine

## 2018-08-28 ENCOUNTER — Other Ambulatory Visit: Payer: Self-pay | Admitting: Medical Oncology

## 2018-08-28 ENCOUNTER — Inpatient Hospital Stay: Payer: PPO

## 2018-08-28 ENCOUNTER — Inpatient Hospital Stay (HOSPITAL_BASED_OUTPATIENT_CLINIC_OR_DEPARTMENT_OTHER): Payer: PPO | Admitting: Internal Medicine

## 2018-08-28 ENCOUNTER — Other Ambulatory Visit: Payer: Self-pay

## 2018-08-28 VITALS — BP 130/60 | HR 73 | Temp 97.8°F | Resp 17 | Ht 64.0 in | Wt 151.2 lb

## 2018-08-28 DIAGNOSIS — Z86711 Personal history of pulmonary embolism: Secondary | ICD-10-CM | POA: Diagnosis not present

## 2018-08-28 DIAGNOSIS — R5383 Other fatigue: Secondary | ICD-10-CM | POA: Diagnosis not present

## 2018-08-28 DIAGNOSIS — Z79899 Other long term (current) drug therapy: Secondary | ICD-10-CM

## 2018-08-28 DIAGNOSIS — E039 Hypothyroidism, unspecified: Secondary | ICD-10-CM

## 2018-08-28 DIAGNOSIS — G4730 Sleep apnea, unspecified: Secondary | ICD-10-CM | POA: Diagnosis not present

## 2018-08-28 DIAGNOSIS — R531 Weakness: Secondary | ICD-10-CM | POA: Diagnosis not present

## 2018-08-28 DIAGNOSIS — E119 Type 2 diabetes mellitus without complications: Secondary | ICD-10-CM | POA: Diagnosis not present

## 2018-08-28 DIAGNOSIS — E669 Obesity, unspecified: Secondary | ICD-10-CM

## 2018-08-28 DIAGNOSIS — D638 Anemia in other chronic diseases classified elsewhere: Secondary | ICD-10-CM

## 2018-08-28 DIAGNOSIS — I700 Atherosclerosis of aorta: Secondary | ICD-10-CM

## 2018-08-28 DIAGNOSIS — C3491 Malignant neoplasm of unspecified part of right bronchus or lung: Secondary | ICD-10-CM

## 2018-08-28 DIAGNOSIS — Z8543 Personal history of malignant neoplasm of ovary: Secondary | ICD-10-CM

## 2018-08-28 DIAGNOSIS — J90 Pleural effusion, not elsewhere classified: Secondary | ICD-10-CM

## 2018-08-28 DIAGNOSIS — D6481 Anemia due to antineoplastic chemotherapy: Secondary | ICD-10-CM

## 2018-08-28 DIAGNOSIS — I10 Essential (primary) hypertension: Secondary | ICD-10-CM

## 2018-08-28 DIAGNOSIS — C3481 Malignant neoplasm of overlapping sites of right bronchus and lung: Secondary | ICD-10-CM | POA: Diagnosis not present

## 2018-08-28 DIAGNOSIS — Z5111 Encounter for antineoplastic chemotherapy: Secondary | ICD-10-CM

## 2018-08-28 DIAGNOSIS — Z5112 Encounter for antineoplastic immunotherapy: Secondary | ICD-10-CM

## 2018-08-28 DIAGNOSIS — F419 Anxiety disorder, unspecified: Secondary | ICD-10-CM

## 2018-08-28 DIAGNOSIS — Z95828 Presence of other vascular implants and grafts: Secondary | ICD-10-CM

## 2018-08-28 DIAGNOSIS — Z7189 Other specified counseling: Secondary | ICD-10-CM

## 2018-08-28 DIAGNOSIS — E785 Hyperlipidemia, unspecified: Secondary | ICD-10-CM

## 2018-08-28 DIAGNOSIS — T451X5A Adverse effect of antineoplastic and immunosuppressive drugs, initial encounter: Secondary | ICD-10-CM

## 2018-08-28 DIAGNOSIS — C3490 Malignant neoplasm of unspecified part of unspecified bronchus or lung: Secondary | ICD-10-CM

## 2018-08-28 DIAGNOSIS — Z9221 Personal history of antineoplastic chemotherapy: Secondary | ICD-10-CM

## 2018-08-28 DIAGNOSIS — I2699 Other pulmonary embolism without acute cor pulmonale: Secondary | ICD-10-CM

## 2018-08-28 DIAGNOSIS — Z7901 Long term (current) use of anticoagulants: Secondary | ICD-10-CM

## 2018-08-28 LAB — CBC WITH DIFFERENTIAL (CANCER CENTER ONLY)
Abs Immature Granulocytes: 0.02 10*3/uL (ref 0.00–0.07)
Basophils Absolute: 0 10*3/uL (ref 0.0–0.1)
Basophils Relative: 0 %
Eosinophils Absolute: 0.2 10*3/uL (ref 0.0–0.5)
Eosinophils Relative: 2 %
HCT: 25.4 % — ABNORMAL LOW (ref 36.0–46.0)
Hemoglobin: 7.9 g/dL — ABNORMAL LOW (ref 12.0–15.0)
Immature Granulocytes: 0 %
Lymphocytes Relative: 19 %
Lymphs Abs: 1.4 10*3/uL (ref 0.7–4.0)
MCH: 35.3 pg — ABNORMAL HIGH (ref 26.0–34.0)
MCHC: 31.1 g/dL (ref 30.0–36.0)
MCV: 113.4 fL — ABNORMAL HIGH (ref 80.0–100.0)
Monocytes Absolute: 0.5 10*3/uL (ref 0.1–1.0)
Monocytes Relative: 7 %
Neutro Abs: 5.5 10*3/uL (ref 1.7–7.7)
Neutrophils Relative %: 72 %
Platelet Count: 174 10*3/uL (ref 150–400)
RBC: 2.24 MIL/uL — ABNORMAL LOW (ref 3.87–5.11)
RDW: 13.4 % (ref 11.5–15.5)
WBC Count: 7.7 10*3/uL (ref 4.0–10.5)
nRBC: 0 % (ref 0.0–0.2)

## 2018-08-28 LAB — CMP (CANCER CENTER ONLY)
ALT: 10 U/L (ref 0–44)
AST: 13 U/L — ABNORMAL LOW (ref 15–41)
Albumin: 2.8 g/dL — ABNORMAL LOW (ref 3.5–5.0)
Alkaline Phosphatase: 58 U/L (ref 38–126)
Anion gap: 10 (ref 5–15)
BUN: 21 mg/dL (ref 8–23)
CO2: 26 mmol/L (ref 22–32)
Calcium: 8.7 mg/dL — ABNORMAL LOW (ref 8.9–10.3)
Chloride: 109 mmol/L (ref 98–111)
Creatinine: 1.44 mg/dL — ABNORMAL HIGH (ref 0.44–1.00)
GFR, Est AFR Am: 41 mL/min — ABNORMAL LOW (ref >60)
GFR, Estimated: 35 mL/min — ABNORMAL LOW (ref >60)
Glucose, Bld: 142 mg/dL — ABNORMAL HIGH (ref 70–99)
Potassium: 3.5 mmol/L (ref 3.5–5.1)
Sodium: 145 mmol/L (ref 135–145)
Total Bilirubin: <0.2 mg/dL — ABNORMAL LOW (ref 0.3–1.2)
Total Protein: 6.1 g/dL — ABNORMAL LOW (ref 6.5–8.1)

## 2018-08-28 LAB — PREPARE RBC (CROSSMATCH)

## 2018-08-28 MED ORDER — ACETAMINOPHEN 325 MG PO TABS
ORAL_TABLET | ORAL | Status: AC
  Filled 2018-08-28: qty 2

## 2018-08-28 MED ORDER — SODIUM CHLORIDE 0.9% FLUSH
10.0000 mL | INTRAVENOUS | Status: AC | PRN
  Administered 2018-08-28: 10 mL
  Filled 2018-08-28: qty 10

## 2018-08-28 MED ORDER — DIPHENHYDRAMINE HCL 25 MG PO CAPS
25.0000 mg | ORAL_CAPSULE | Freq: Once | ORAL | Status: AC
  Administered 2018-08-28: 11:00:00 25 mg via ORAL

## 2018-08-28 MED ORDER — ACETAMINOPHEN 325 MG PO TABS
650.0000 mg | ORAL_TABLET | Freq: Once | ORAL | Status: AC
  Administered 2018-08-28: 650 mg via ORAL

## 2018-08-28 MED ORDER — DIPHENHYDRAMINE HCL 25 MG PO CAPS
ORAL_CAPSULE | ORAL | Status: AC
  Filled 2018-08-28: qty 1

## 2018-08-28 MED ORDER — HEPARIN SOD (PORK) LOCK FLUSH 100 UNIT/ML IV SOLN
500.0000 [IU] | Freq: Every day | INTRAVENOUS | Status: AC | PRN
  Administered 2018-08-28: 14:00:00 500 [IU]
  Filled 2018-08-28: qty 5

## 2018-08-28 MED ORDER — SODIUM CHLORIDE 0.9% FLUSH
10.0000 mL | INTRAVENOUS | Status: DC | PRN
  Administered 2018-08-28: 10 mL
  Filled 2018-08-28: qty 10

## 2018-08-28 MED ORDER — SODIUM CHLORIDE 0.9% IV SOLUTION
250.0000 mL | Freq: Once | INTRAVENOUS | Status: AC
  Administered 2018-08-28: 11:00:00 250 mL via INTRAVENOUS
  Filled 2018-08-28: qty 250

## 2018-08-28 NOTE — Patient Instructions (Signed)
Blood Transfusion, Adult, Care After This sheet gives you information about how to care for yourself after your procedure. Your doctor may also give you more specific instructions. If you have problems or questions, contact your doctor. Follow these instructions at home:   Take over-the-counter and prescription medicines only as told by your doctor.  Go back to your normal activities as told by your doctor.  Follow instructions from your doctor about how to take care of the area where an IV tube was put into your vein (insertion site). Make sure you: ? Wash your hands with soap and water before you change your bandage (dressing). If there is no soap and water, use hand sanitizer. ? Change your bandage as told by your doctor.  Check your IV insertion site every day for signs of infection. Check for: ? More redness, swelling, or pain. ? More fluid or blood. ? Warmth. ? Pus or a bad smell. Contact a doctor if:  You have more redness, swelling, or pain around the IV insertion site.  You have more fluid or blood coming from the IV insertion site.  Your IV insertion site feels warm to the touch.  You have pus or a bad smell coming from the IV insertion site.  Your pee (urine) turns pink, red, or brown.  You feel weak after doing your normal activities. Get help right away if:  You have signs of a serious allergic or body defense (immune) system reaction, including: ? Itchiness. ? Hives. ? Trouble breathing. ? Anxiety. ? Pain in your chest or lower back. ? Fever, flushing, and chills. ? Fast pulse. ? Rash. ? Watery poop (diarrhea). ? Throwing up (vomiting). ? Dark pee. ? Serious headache. ? Dizziness. ? Stiff neck. ? Yellow color in your face or the white parts of your eyes (jaundice). Summary  After a blood transfusion, return to your normal activities as told by your doctor.  Every day, check for signs of infection where the IV tube was put into your vein.  Some  signs of infection are warm skin, more redness and pain, more fluid or blood, and pus or a bad smell where the needle went in.  Contact your doctor if you feel weak or have any unusual symptoms. This information is not intended to replace advice given to you by your health care provider. Make sure you discuss any questions you have with your health care provider. Document Released: 03/21/2014 Document Revised: 10/23/2015 Document Reviewed: 10/23/2015 Elsevier Interactive Patient Education  2019 Elsevier Inc.  

## 2018-08-28 NOTE — Progress Notes (Signed)
[facility]:([phone]   Fax:([phone]  OFFICE PROGRESS NOTE  [name], MD [place] [address]  DIAGNOSIS:  1) Stage IV (T3, N0, M1 a)non-small cell lung cancer, well-differentiated adenocarcinoma presented with multifocal disease involving mainly the right upper lobe as well as the right middle lobe and left lower lobe of the lung diagnosed in May 2019. 2) bilateral small pulmonary emboli diagnosed incidentally on CT scan of the chest on October 16, 2017  Guardant 360 testing was negative for actionable mutations.  PRIOR THERAPY:None  CURRENT THERAPY: 1) Carboplatin for an AUC of 5, Alimta 500 mg/m2 and Keytruda 200 mg IV given every 3 weeks. First dosegiven on 08/18/2017.Status post 15 cycles.  Starting from cycle #5 the patient is on maintenance Alimta and Ketruda (pembrolizumab) every 3 weeks. 2) Xarelto 15 mg p.o. twice daily for the first 3 weeks followed by 20 mg p.o. Daily.  INTERVAL HISTORY: Erin Myers 78 y.o. female returns to the clinic today for follow-up visit.  The patient continues to complain of increasing fatigue and weakness as well as shortness of breath at baseline increased with exertion.  She is currently on home oxygen.  She denied having any chest pain but has mild cough with no hemoptysis.  She has no recent weight loss or night sweats.  She denied having any fever or chills.  She has no headache or visual changes.  The patient has lack of stamina.  She has been in observation for the last 2-3 months.  She did notice some improvement in her condition especially the fatigue.  She denied having any bleeding issues.  She had repeat CT scan of the chest, abdomen pelvis performed recently and she is here for evaluation and discussion of her scan results and treatment options.  MEDICAL HISTORY: Past Medical History:  Diagnosis Date   Anxiety    Diabetes mellitus without complication ([place])     Dyslipidemia    HTN (hypertension)    Hyperlipemia    Hypothyroid    Hypothyroidism    Lung cancer ([place]) 2000   Carcinoid, s/p RLL lobectomy   Obesity    Ovarian cancer ([place])    Palpitations 2009 and 2006   Negative nuclear stress test    Sleep apnea    on C-pap    ALLERGIES:  is allergic to adhesive [tape]; codeine; lactose intolerance (gi); monosodium glutamate; and sulfa antibiotics.  MEDICATIONS:  Current Outpatient Medications  Medication Sig Dispense Refill   acetaminophen (TYLENOL) 500 MG tablet Take 500 mg by mouth every 6 (six) hours as needed.     cholecalciferol (VITAMIN D3) 25 MCG (1000 UT) tablet Take 1,000 Units by mouth daily.     clorazepate (TRANXENE) 7.5 MG tablet Take 7.5 mg by mouth 2 (two) times daily as needed for anxiety.     docusate sodium (COLACE) 100 MG capsule Take 100 mg by mouth 2 (two) times daily.     folic acid (FOLVITE) 1 MG tablet TAKE 1 TABLET BY MOUTH DAILY. 30 tablet 2   ipratropium-albuterol (DUONEB) 0.5-2.5 (3) MG/3ML SOLN INHALE THE CONTENTS OF 1 VIAL VIA NEBULIZER 3 TIMES A DAY. 360 mL 2   levothyroxine (SYNTHROID, LEVOTHROID) 175 MCG tablet Take 175 mcg by mouth daily before breakfast.     lidocaine-prilocaine (EMLA) cream Apply 1 application topically as needed. 30 g 0   metoprolol succinate (TOPROL-XL) 50 MG 24 hr tablet Take 50 mg by mouth 2 (two)  times daily.      Multiple Vitamins-Minerals (VISION FORMULA 2 PO) Take by mouth. OTC "Vision pills for Macular degeneration"     rosuvastatin (CRESTOR) 10 MG tablet Take 10 mg by mouth every evening.      SYMBICORT 160-4.5 MCG/ACT inhaler INHALE 2 PUFFS INTO THE LUNGS 2 TIMES DAILY. 10.2 g 6   valACYclovir (VALTREX) 500 MG tablet Take 500 mg by mouth daily.     XARELTO 20 MG TABS tablet TAKE 1 TABLET (20 MG TOTAL) BY MOUTH DAILY WITH SUPPER. 30 tablet 1   benzonatate (TESSALON) 200 MG capsule Take 1 capsule (200 mg total) by mouth 3 (three) times daily as needed  for cough. (Patient not taking: Reported on 06/28/2018) 30 capsule 1   guaiFENesin-codeine (CHERATUSSIN AC) 100-10 MG/5ML syrup Take 5 mLs by mouth every 6 (six) hours as needed for cough. (Patient not taking: Reported on 08/28/2018) 240 mL 0   HYDROcodone-acetaminophen (NORCO/VICODIN) 5-325 MG tablet Take 1-2 tablets by mouth every 4 (four) hours as needed. 30 tablet 0   prochlorperazine (COMPAZINE) 10 MG tablet Take 1 tablet (10 mg total) by mouth every 6 (six) hours as needed for nausea or vomiting. (Patient not taking: Reported on 08/28/2018) 30 tablet 0   No current facility-administered medications for this visit.     SURGICAL HISTORY:  Past Surgical History:  Procedure Laterality Date   ABDOMINAL HYSTERECTOMY  2001   GRADED EXERCISE TOLERANCE TEST  08/2015    Blood pressure demonstrated a hypertensive response to exercise.  There was less than 0.43mm of horizontal ST segment depression in the inferolateral leads. No diagnostic criterial for ischemia. Moderately impaired exercise tolerance. The patient achieved 5.8 mets. This is a low risk study.   IR FLUORO GUIDE PORT INSERTION RIGHT  08/17/2017   IR THORACENTESIS ASP PLEURAL SPACE W/IMG GUIDE  07/04/2018   IR US GUIDE VASC ACCESS RIGHT  08/17/2017   LOBECTOMY  2000   RLL   NM MYOCAR PERF WALL MOTION  08/20/2007   EF 74%  EXERCISE CAPACITY 7 METS   OOPHORECTOMY  2001   TONSILLECTOMY     VIDEO BRONCHOSCOPY Bilateral 07/20/2017   Procedure: VIDEO BRONCHOSCOPY WITH FLUORO;  Surgeon: Collene Gobble, MD;  Location: [facility];  Service: Cardiopulmonary;  Laterality: Bilateral;    REVIEW OF SYSTEMS:  Constitutional: positive for anorexia and fatigue Eyes: negative Ears, nose, mouth, throat, and face: negative Respiratory: positive for cough and dyspnea on exertion Cardiovascular: negative Gastrointestinal: negative Genitourinary:negative Integument/breast: negative Hematologic/lymphatic: negative Musculoskeletal:positive for  muscle weakness Neurological: negative Behavioral/Psych: negative Endocrine: negative Allergic/Immunologic: negative   PHYSICAL EXAMINATION: General appearance: alert, cooperative, fatigued, no distress and moderately obese Head: Normocephalic, without obvious abnormality, atraumatic Neck: no adenopathy, no JVD, supple, symmetrical, trachea midline and thyroid not enlarged, symmetric, no tenderness/mass/nodules Lymph nodes: Cervical, supraclavicular, and axillary nodes normal. Resp: wheezes bilaterally Back: symmetric, no curvature. ROM normal. No CVA tenderness. Cardio: regular rate and rhythm, S1, S2 normal, no murmur, click, rub or gallop GI: soft, non-tender; bowel sounds normal; no masses,  no organomegaly Extremities: extremities normal, atraumatic, no cyanosis or edema Neurologic: Alert and oriented X 3, normal strength and tone. Normal symmetric reflexes. Normal coordination and gait  ECOG PERFORMANCE STATUS: 2 - Symptomatic, <50% confined to bed  Blood pressure 130/60, pulse 73, temperature 97.8 F (36.6 C), temperature source Oral, resp. rate 17, height 5\' 4"  (1.626 m), weight 151 lb 3.2 oz (68.6 kg), SpO2 100 %.  LABORATORY DATA: Lab Results  Component  Value Date   WBC 7.7 08/28/2018   HGB 7.9 (L) 08/28/2018   HCT 25.4 (L) 08/28/2018   MCV 113.4 (H) 08/28/2018   PLT 174 08/28/2018      Chemistry      Component Value Date/Time   NA 143 08/23/2018 1044   K 4.2 08/23/2018 1044   CL 105 08/23/2018 1044   CO2 30 08/23/2018 1044   BUN 17 08/23/2018 1044   CREATININE 1.39 (H) 08/23/2018 1044      Component Value Date/Time   CALCIUM 8.6 (L) 08/23/2018 1044   ALKPHOS 58 08/23/2018 1044   AST 13 (L) 08/23/2018 1044   ALT 8 08/23/2018 1044   BILITOT 0.3 08/23/2018 1044       RADIOGRAPHIC STUDIES: Ct Chest W Contrast  Result Date: 08/23/2018 CLINICAL DATA:  Right-sided lung cancer. EXAM: CT CHEST, ABDOMEN, AND PELVIS WITH CONTRAST TECHNIQUE: Multidetector CT  imaging of the chest, abdomen and pelvis was performed following the standard protocol during bolus administration of intravenous contrast. CONTRAST:  35mL OMNIPAQUE IOHEXOL 300 MG/ML  SOLN COMPARISON:  06/26/2018 FINDINGS: CT CHEST FINDINGS Cardiovascular: Heart size stable. Trace pericardial effusion noted. Coronary artery calcification is evident. Atherosclerotic calcification is noted in the wall of the thoracic aorta. Right Port-A-Cath tip is positioned in the right atrium. Mediastinum/Nodes: Low paraesophageal node measures 12 mm short axis today (31/2) compared to 6 mm previously. No other mediastinal lymphadenopathy by size criteria. There is no hilar lymphadenopathy. The esophagus has normal imaging features. There is no axillary lymphadenopathy. There is no hilar lymphadenopathy. Lungs/Pleura: Architectural distortion with patchy ground-glass attenuation in the posterior right upper lobe is stable. Postsurgical change noted anterior right lung base. Interval decrease in right pleural effusion. Tiny left pleural effusion is new in the interval. Musculoskeletal: No worrisome lytic or sclerotic osseous abnormality. CT ABDOMEN PELVIS FINDINGS Hepatobiliary: No suspicious focal abnormality within the liver parenchyma. There is no evidence for gallstones, gallbladder wall thickening, or pericholecystic fluid. No intrahepatic or extrahepatic biliary dilation. Pancreas: No focal mass lesion. No dilatation of the main duct. No intraparenchymal cyst. No peripancreatic edema. Spleen: No splenomegaly. No focal mass lesion. Adrenals/Urinary Tract: No adrenal nodule or mass. Tiny low-density lesion in the interpolar right kidney is too small to characterize. 6 mm exophytic lesion posterior aspect lower left kidney is too small to characterize. No evidence for hydroureter. Bladder is decompressed. Stomach/Bowel: Stomach is unremarkable. No gastric wall thickening. No evidence of outlet obstruction. Duodenum is normally  positioned as is the ligament of Treitz. No small bowel wall thickening. No small bowel dilatation. The terminal ileum is normal. No gross colonic mass. No colonic wall thickening. Vascular/Lymphatic: There is abdominal aortic atherosclerosis without aneurysm. There is no gastrohepatic or hepatoduodenal ligament lymphadenopathy. No intraperitoneal or retroperitoneal lymphadenopathy. No pelvic sidewall lymphadenopathy. Reproductive: The uterus is surgically absent. There is no adnexal mass. Other: No intraperitoneal free fluid. Musculoskeletal: No worrisome lytic or sclerotic osseous abnormality. IMPRESSION: 1. Interval progression of a 12 mm short axis low paraesophageal lymph node. Close attention on follow-up recommended as metastatic disease a concern. 2. Interval decrease in right pleural effusion new tiny left pleural effusion. 3. 6 mm exophytic lesion lower pole left kidney. Attention on follow-up recommended. 4.  Aortic Atherosclerois (ICD10-170.0) Electronically Signed   By: Misty Stanley M.D.   On: 08/23/2018 14:45   Ct Abdomen Pelvis W Contrast  Result Date: 08/23/2018 CLINICAL DATA:  Right-sided lung cancer. EXAM: CT CHEST, ABDOMEN, AND PELVIS WITH CONTRAST TECHNIQUE: Multidetector CT imaging  of the chest, abdomen and pelvis was performed following the standard protocol during bolus administration of intravenous contrast. CONTRAST:  56mL OMNIPAQUE IOHEXOL 300 MG/ML  SOLN COMPARISON:  06/26/2018 FINDINGS: CT CHEST FINDINGS Cardiovascular: Heart size stable. Trace pericardial effusion noted. Coronary artery calcification is evident. Atherosclerotic calcification is noted in the wall of the thoracic aorta. Right Port-A-Cath tip is positioned in the right atrium. Mediastinum/Nodes: Low paraesophageal node measures 12 mm short axis today (31/2) compared to 6 mm previously. No other mediastinal lymphadenopathy by size criteria. There is no hilar lymphadenopathy. The esophagus has normal imaging features.  There is no axillary lymphadenopathy. There is no hilar lymphadenopathy. Lungs/Pleura: Architectural distortion with patchy ground-glass attenuation in the posterior right upper lobe is stable. Postsurgical change noted anterior right lung base. Interval decrease in right pleural effusion. Tiny left pleural effusion is new in the interval. Musculoskeletal: No worrisome lytic or sclerotic osseous abnormality. CT ABDOMEN PELVIS FINDINGS Hepatobiliary: No suspicious focal abnormality within the liver parenchyma. There is no evidence for gallstones, gallbladder wall thickening, or pericholecystic fluid. No intrahepatic or extrahepatic biliary dilation. Pancreas: No focal mass lesion. No dilatation of the main duct. No intraparenchymal cyst. No peripancreatic edema. Spleen: No splenomegaly. No focal mass lesion. Adrenals/Urinary Tract: No adrenal nodule or mass. Tiny low-density lesion in the interpolar right kidney is too small to characterize. 6 mm exophytic lesion posterior aspect lower left kidney is too small to characterize. No evidence for hydroureter. Bladder is decompressed. Stomach/Bowel: Stomach is unremarkable. No gastric wall thickening. No evidence of outlet obstruction. Duodenum is normally positioned as is the ligament of Treitz. No small bowel wall thickening. No small bowel dilatation. The terminal ileum is normal. No gross colonic mass. No colonic wall thickening. Vascular/Lymphatic: There is abdominal aortic atherosclerosis without aneurysm. There is no gastrohepatic or hepatoduodenal ligament lymphadenopathy. No intraperitoneal or retroperitoneal lymphadenopathy. No pelvic sidewall lymphadenopathy. Reproductive: The uterus is surgically absent. There is no adnexal mass. Other: No intraperitoneal free fluid. Musculoskeletal: No worrisome lytic or sclerotic osseous abnormality. IMPRESSION: 1. Interval progression of a 12 mm short axis low paraesophageal lymph node. Close attention on follow-up  recommended as metastatic disease a concern. 2. Interval decrease in right pleural effusion new tiny left pleural effusion. 3. 6 mm exophytic lesion lower pole left kidney. Attention on follow-up recommended. 4.  Aortic Atherosclerois (ICD10-170.0) Electronically Signed   By: Misty Stanley M.D.   On: 08/23/2018 14:45    ASSESSMENT AND PLAN: This is a very pleasant 78 years old white female with stage IV non-small cell lung cancer, adenocarcinoma with no actionable mutations. She is currently undergoing systemic chemotherapy with carboplatin, Alimta and Ketruda (pembrolizumab) status post 15 cycles.  Starting from cycle #5 she is on maintenance treatment with Alimta and Ketruda (pembrolizumab). The patient has been tolerating this treatment well but recently she has more fatigue, weakness as well as weight loss. She was off treatment for the last 2 months.  She continues to have fatigue and weakness.  She has no improvement in her hemoglobin and hematocrit even when she was off treatment. She had repeat CT scan of the chest, abdomen pelvis performed recently.  I personally and independently reviewed the scan images and discussed the result and showed the images to the patient today. Her scan showed a stable disease except for enlarging low paraesophageal lymph node measuring 1.2 cm. I had a lengthy discussion with the patient today about her current condition and treatment options.  The patient was still under the  impression that her condition will be cured with treatment.  I had a lengthy discussion explaining to the patient her current disease status, prognosis and treatment options.  I explained to the patient that she has incurable condition and all the treatment are of palliative nature. I gave the patient the option of continuous observation and close monitoring for her condition especially with her stable disease except for the slightly enlarging low paraesophageal lymph node versus resuming  treatment with systemic chemotherapy with Alimta and Keytruda. After discussion of the 2 options in details, the patient decided to continue on observation for a little bit longer to build her stamina. For the anemia of chronic disease, I will arrange for the patient to receive 1 unit of PRBCs transfusion today.  I will also check anemia panel with her upcoming lab work. The patient had a lot of questions and I spent a good amount of time explaining to her her condition as well as prognosis and treatment options. She was advised to call immediately if she has any concerning symptoms in the interval. The patient voices understanding of current disease status and treatment options and is in agreement with the current care plan. All questions were answered. The patient knows to call the clinic with any problems, questions or concerns. We can certainly see the patient much sooner if necessary. I spent 30 minutes counseling the patient face to face. The total time spent in the appointment was 40 minutes. Disclaimer: This note was dictated with voice recognition software. Similar sounding words can inadvertently be transcribed and may not be corrected upon review.

## 2018-08-29 ENCOUNTER — Inpatient Hospital Stay: Payer: PPO | Admitting: Nutrition

## 2018-08-29 LAB — TYPE AND SCREEN
ABO/RH(D): A NEG
Antibody Screen: NEGATIVE
Unit division: 0

## 2018-08-29 LAB — BPAM RBC
Blood Product Expiration Date: 202007122359
ISSUE DATE / TIME: 202006161207
Unit Type and Rh: 600

## 2018-08-29 NOTE — Progress Notes (Signed)
RD working remotely.  Nutrition follow up completed with patient over the phone. Patient reports she has been on break from chemo. Current weight documented is 151.2 pounds on June 16 increased from 138.7 pounds in April. Patient reports she has not resumed chemotherapy secondary to low hemoglobin. Noted hemoglobin 7.9, hematocrit 25.4. She is now taking an iron supplement. She denies other nutrition impact symptoms.  Nutrition diagnosis: Unintended weight loss improved.  Intervention: I educated patient on strategies for increasing iron absorption.  I will email fact sheets for patient on high iron foods. I have provided support and encouragement for patient to continue strategies for adequate calorie intake and protein intake.  Monitoring, evaluation, goals: Patient will tolerate increased calories, protein, and high iron foods.  Next visit: Wednesday, July 8.  **Disclaimer: This note was dictated with voice recognition software. Similar sounding words can inadvertently be transcribed and this note may contain transcription errors which may not have been corrected upon publication of note.**

## 2018-08-30 ENCOUNTER — Telehealth: Payer: Self-pay | Admitting: Internal Medicine

## 2018-08-30 NOTE — Telephone Encounter (Signed)
Called and spoke with patient to inform of cancelled and rescheduled appts per los. Patient confirmed dates and times

## 2018-09-11 ENCOUNTER — Other Ambulatory Visit (HOSPITAL_COMMUNITY): Payer: Self-pay | Admitting: Internal Medicine

## 2018-09-11 ENCOUNTER — Other Ambulatory Visit: Payer: Self-pay

## 2018-09-11 ENCOUNTER — Ambulatory Visit (HOSPITAL_COMMUNITY)
Admission: RE | Admit: 2018-09-11 | Discharge: 2018-09-11 | Disposition: A | Discharge status: Home / Self Care | Payer: PPO | Source: Ambulatory Visit | Attending: Family | Admitting: Family

## 2018-09-11 DIAGNOSIS — E1151 Type 2 diabetes mellitus with diabetic peripheral angiopathy without gangrene: Secondary | ICD-10-CM | POA: Diagnosis not present

## 2018-09-17 ENCOUNTER — Other Ambulatory Visit: Payer: Self-pay | Admitting: Internal Medicine

## 2018-09-17 DIAGNOSIS — I2699 Other pulmonary embolism without acute cor pulmonale: Secondary | ICD-10-CM

## 2018-09-18 ENCOUNTER — Other Ambulatory Visit: Payer: PPO

## 2018-09-18 ENCOUNTER — Ambulatory Visit: Payer: PPO

## 2018-09-18 ENCOUNTER — Ambulatory Visit: Payer: PPO | Admitting: Nutrition

## 2018-09-18 ENCOUNTER — Ambulatory Visit: Payer: PPO | Admitting: Internal Medicine

## 2018-09-18 NOTE — Progress Notes (Signed)
RD working remotely.  Patient called to follow up. She states she feels well and thinks she has more energy. She has been taking her iron and drinking OJ with it to increase absorption. She has no nutrition needs at this time. I cannot evaluate her weight changes. Patient agreed to contact me for any further needs or questions.She has my contact information.

## 2018-09-19 ENCOUNTER — Inpatient Hospital Stay: Payer: PPO | Admitting: Nutrition

## 2018-09-19 DIAGNOSIS — G609 Hereditary and idiopathic neuropathy, unspecified: Secondary | ICD-10-CM | POA: Diagnosis not present

## 2018-09-19 DIAGNOSIS — R209 Unspecified disturbances of skin sensation: Secondary | ICD-10-CM | POA: Diagnosis not present

## 2018-09-19 DIAGNOSIS — C3431 Malignant neoplasm of lower lobe, right bronchus or lung: Secondary | ICD-10-CM | POA: Diagnosis not present

## 2018-09-19 DIAGNOSIS — E1151 Type 2 diabetes mellitus with diabetic peripheral angiopathy without gangrene: Secondary | ICD-10-CM | POA: Diagnosis not present

## 2018-09-24 DIAGNOSIS — G4733 Obstructive sleep apnea (adult) (pediatric): Secondary | ICD-10-CM | POA: Diagnosis not present

## 2018-09-26 ENCOUNTER — Other Ambulatory Visit: Payer: Self-pay

## 2018-09-26 ENCOUNTER — Ambulatory Visit (INDEPENDENT_AMBULATORY_CARE_PROVIDER_SITE_OTHER): Payer: PPO | Admitting: Neurology

## 2018-09-26 ENCOUNTER — Encounter: Payer: Self-pay | Admitting: Neurology

## 2018-09-26 VITALS — BP 131/63 | HR 70

## 2018-09-26 DIAGNOSIS — M79672 Pain in left foot: Secondary | ICD-10-CM | POA: Diagnosis not present

## 2018-09-26 DIAGNOSIS — G629 Polyneuropathy, unspecified: Secondary | ICD-10-CM | POA: Diagnosis not present

## 2018-09-26 DIAGNOSIS — M79671 Pain in right foot: Secondary | ICD-10-CM | POA: Diagnosis not present

## 2018-09-26 NOTE — Progress Notes (Signed)
Subjective:    Patient ID: Erin Myers is a 78 y.o. female.  HPI     Erin Age, MD, PhD [facility] [address], [address] [place], [place] [address]  Dear Dr. Philip Myers,  I saw your patient, Erin Myers, upon your kind request in my neurologic clinic today for initial consultation of her neuropathy.  The patient is accompanied by her husband today today.  As you know, Erin Myers is a 78 year old right-handed woman with an underlying medical history of hypertension, hyperlipidemia, hypothyroidism, lung cancer, ovarian cancer, anxiety, palpitations, diabetes and mildly overweight state with prior history of obesity, who reports numbness and pain in both feet for the past 2 or 3 months.  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  She follows with Dr. Earlie Myers.  She has an appointment with him next month, she has a CT pending.  She reports that her toes started feeling numb some 2 or maybe 3 months ago.  It has affected her feet and the numbness is worse on the right than left, she also has a stinging sensation at the bottom of her feet like she is walking on gravel or a rock.  I have previously seen her for sleep apnea, she has been lost to follow-up for over 3 years.  I reviewed your office note from 09/19/2018, which you kindly included.  She has had numbness in her feet.  She has been followed by oncology and is on chemotherapy.  Her latest A1c from July 05, 2018 per your records was 5.2.  She has had weight loss.  She is supplementing with protein drinks. She was started on trial of gabapentin 100 mg 3 times daily as needed.  She reports that she has not actually taken it yet, she is afraid of side effects, she read the insert.  The pain is intermittent, she has difficulty walking, she uses a walker at the house, she fell a few months ago because  the right knee gave out.  She has since then received an injection into the right knee.  She also had a vascular study done which showed benign findings.  She feels that her feet are cold and discolored sometimes.  She uses oxygen 24-7.  She hooks up her oxygen to the CPAP machine at night.  She tries to hydrate well with water.  She does have dryness of her mouth because of using oxygen.  Her A1c has been good, never above 7.  She is not on any diabetes medication.  She has lost weight. She has had anemia, she has Required 2 infusions in the past 6 months, in April and June.  She has regular lab work through oncology mostly.  Previously:   03/30/2015: Erin Myers is a 78 year old right-handed woman with an underlying medical history of palpitations, hypertension, dyslipidemia, hypothyroidism, carcinoid lung cancer, status post right lower lobe lobectomy, who presents for follow-up consultation of her obstructive sleep apnea, after recent sleep studies. The patient is unaccompanied today.   I first met her on 10/23/2014 at the request of her primary care physician, at which time she reported a prior diagnosis of OSA and prior CPAP therapy but she had stopped using CPAP. I invited her back for sleep study. She had a baseline sleep study, followed by a CPAP titration study. I went over her test results with her in detail today. Her baseline  sleep study from 12/15/2014 showed a sleep efficiency of 77.8% with a sleep latency of 52 minutes and wake after sleep onset of 52.5 minutes with mild sleep fragmentation noted. She had an elevated arousal index. She had an increased percentage of stage II sleep, a mildly decreased percentage of slow-wave sleep and a normal percentage of REM sleep with a normal REM latency. She had no significant PLMS, EKG or EEG changes. Moderate to loud snoring was noted. Total AHI was 25.9 per hour, average oxygen saturation of only 86%, nadir was 71% during REM sleep. Time below 88%  saturation was nearly 5 hours.  Based on her sleep test results I invited her back for a full night CPAP titration study. She had this on 01/18/2015. Sleep efficiency was 77.7% with a latency to sleep of 54 minutes and wake after sleep onset of 56 minutes with mild to moderate sleep fragmentation noted. She had a normal arousal index. She had an increased percentage of stage II sleep, absence of slow-wave sleep and a normal percentage of REM sleep with a normal REM latency. She had no significant PLMS, EKG or EEG changes. She had an average oxygen saturation of 89%, nadir was 81%. Time below 88% saturation was 2 hours and 11 minutes. Based on her test results are prescribed CPAP therapy for home use and also ordered a overnight pulse oximetry test once she was established on home CPAP therapy.   I reviewed her home pulse oximetry test results from 03/01/2015 while on CPAP therapy: Average oxygen saturation was 89.2%, nadir was 73%, time below 88% saturation was 266.4 minutes. Based on her test results I prescribed supplemental oxygen with CPAP therapy.   I reviewed her CPAP compliance data from 02/25/2015 through 03/26/2015 which is a total of 30 days during which time she used her CPAP every night except for 1, with percent used days greater than 4 hours at 90%, indicating excellent compliance with an average usage of 6 hours and 42 minutes, residual AHI 1.1 per hour, leak low with the 95th percentile at 4.6 L/m and a pressure of 11 cm with EPR of 2.   She reports doing well, using CPAP, but not the O2, due to cost and too cumbersome. Had lung cancer, had RLL removed for Carcinoid, no chemo, no radiation. She has allergies, post nasal drip, occasional SOB and occasionally wheezy. Stopped smoking over 25 years ago. Had not had a PFT in years. She is compliant with CPAP treatment. While she does not notice a telltale improvement of her sleep, she does wake up a little better rested.    10/23/2014: She was  previously diagnosed with obstructive sleep apnea several years ago and placed on CPAP therapy. Prior sleep test results are not available for my review today. I reviewed your office note from 07/24/2014, which you kindly included. This mentions a prior diagnosis of severe obstructive sleep apnea and CPAP therapy at a pressure of 7 cm. She no longer is on treatment, as she had a difficulty time with it.  She reports no family history of OSA. She would be willing to try CPAP again if the need arises. She drinks caffeine in the form of iced tea, 1-2 glasses per day, she does not drink sodas or coffee. She rarely drinks alcohol maybe once or twice per year. She quit smoking in 1995. She works with her husband who has an Multimedia programmer. She is a retired Catering manager. Bedtime is around 11 PM and rise time  is around 7 AM. She does not set an alarm. She wakes up sometimes adequately rested and sometimes marginally rested. She denies any headaches. Over the course of time she has gained weight. She has no significant nocturia. She feels tired during the day and may follow sleep if sedentary. She watches TV in bed at night. She denies restless leg symptoms. Her husband noticed her snoring and years ago she had an episode of gasping for air. She is not known to twitch her legs and her sleep. Her Epworth sleepiness score is 9 out of 24 today, her fatigue score is 36 out of 63.  Her Past Medical History Is Significant For: Past Medical History:  Diagnosis Date  . Anxiety   . Diabetes mellitus without complication ([place])   . Dyslipidemia   . HTN (hypertension)   . Hyperlipemia   . Hypothyroid   . Hypothyroidism   . Lung cancer ([place]) 2000   Carcinoid, s/p RLL lobectomy  . Obesity   . Ovarian cancer ([place])   . Palpitations 2009 and 2006   Negative nuclear stress test   . Sleep apnea    on C-pap    Her Past Surgical History Is Significant For: Past Surgical History:  Procedure Laterality Date  .  ABDOMINAL HYSTERECTOMY  2001  . GRADED EXERCISE TOLERANCE TEST  08/2015    Blood pressure demonstrated a hypertensive response to exercise.  There was less than 0.81m of horizontal ST segment depression in the inferolateral leads. No diagnostic criterial for ischemia. Moderately impaired exercise tolerance. The patient achieved 5.8 mets. This is a low risk study.  . IR FLUORO GUIDE PORT INSERTION RIGHT  08/17/2017  . IR THORACENTESIS ASP PLEURAL SPACE W/IMG GUIDE  07/04/2018  . IR [place] VASC ACCESS RIGHT  08/17/2017  . LOBECTOMY  2000   RLL  . NM MYOCAR PERF WALL MOTION  08/20/2007   EF 74%  EXERCISE CAPACITY 7 METS  . OOPHORECTOMY  2001  . TONSILLECTOMY    . VIDEO BRONCHOSCOPY Bilateral 07/20/2017   Procedure: VIDEO BRONCHOSCOPY WITH FLUORO;  Surgeon: BCollene Gobble MD;  [facility];  Service: Cardiopulmonary;  Laterality: Bilateral;    Her Family History Is Significant For: Family History  Problem Relation Myers of Onset  . Coronary artery disease Mother 552      died of an MI  . Heart attack Mother   . Suicidality Sister   . CVA Brother [id number] . CVA Father     Her Social History Is Significant For: Social History   Socioeconomic History  . Marital status: Married    Spouse name: Not on file  . Number of children: 2  . Years of education: HS  . Highest education level: Not on file  Occupational History  . Not on file  Social Needs  . Financial resource strain: Not on file  . Food insecurity    Worry: Not on file    Inability: Not on file  . Transportation needs    Medical: Not on file    Non-medical: Not on file  Tobacco Use  . Smoking status: Former Smoker    Packs/day: 0.75    Years: 30.00    Pack years: 22.50    Types: Cigarettes    Quit date: 08/18/1991    Years since quitting: 27.1  . Smokeless tobacco: Never Used  Substance and Sexual Activity  . Alcohol use: No    Alcohol/week: 0.0 standard drinks  . Drug use:  No  . Sexual activity: Yes     Partners: Male  Lifestyle  . Physical activity    Days per week: Not on file    Minutes per session: Not on file  . Stress: Not on file  Relationships  . Social Herbalist on phone: Not on file    Gets together: Not on file    Attends religious service: Not on file    Active member of club or organization: Not on file    Attends meetings of clubs or organizations: Not on file    Relationship status: Not on file  Other Topics Concern  . Not on file  Social History Narrative   1-2 glasses of tea a day    Married mother of 2 (daughter close to 81 and son 45-75 years old).  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    Her Allergies Are:  Allergies  Allergen Reactions  . Adhesive [Tape] Other (See Comments)    Irritates skin   . Codeine Other (See Comments)    Skin crawlinf feeling  . Lactose Intolerance (Gi) Diarrhea  . Monosodium Glutamate Diarrhea  . Sulfa Antibiotics Nausea And Vomiting  :   Her Current Medications Are:  Outpatient Encounter Medications as of 09/26/2018  Medication Sig  . acetaminophen (TYLENOL) 500 MG tablet Take 500 mg by mouth every 6 (six) hours as needed.  . benzonatate (TESSALON) 200 MG capsule Take 1 capsule (200 mg total) by mouth 3 (three) times daily as needed for cough.  . cholecalciferol (VITAMIN D3) 25 MCG (1000 UT) tablet Take 1,000 Units by mouth daily.  . clorazepate (TRANXENE) 7.5 MG tablet Take 7.5 mg by mouth 2 (two) times daily as needed for anxiety.  . docusate sodium (COLACE) 100 MG capsule Take 100 mg by mouth 2 (two) times daily.  . folic acid (FOLVITE) 1 MG tablet TAKE 1 TABLET BY MOUTH DAILY.  [name] guaiFENesin-codeine (CHERATUSSIN AC) 100-10 MG/5ML syrup Take 5 mLs by mouth every 6 (six) hours as needed for cough.  [name] ipratropium-albuterol (DUONEB) 0.5-2.5 (3) MG/3ML SOLN INHALE THE CONTENTS OF 1 VIAL VIA NEBULIZER 3 TIMES A DAY.  [name] levothyroxine (SYNTHROID,  LEVOTHROID) 175 MCG tablet Take 175 mcg by mouth daily before breakfast.  . lidocaine-prilocaine (EMLA) cream Apply 1 application topically as needed.  . metoprolol succinate (TOPROL-XL) 50 MG 24 hr tablet Take 50 mg by mouth 2 (two) times daily.   . Multiple Vitamins-Minerals (VISION FORMULA 2 PO) Take by mouth. OTC "Vision pills for Macular degeneration"  . prochlorperazine (COMPAZINE) 10 MG tablet Take 1 tablet (10 mg total) by mouth every 6 (six) hours as needed for nausea or vomiting.  . rosuvastatin (CRESTOR) 10 MG tablet Take 10 mg by mouth every evening.   . SYMBICORT 160-4.5 MCG/ACT inhaler INHALE 2 PUFFS INTO THE LUNGS 2 TIMES DAILY.  . valACYclovir (VALTREX) 500 MG tablet Take 500 mg by mouth daily.  [name] 20 MG TABS tablet TAKE 1 TABLET BY MOUTH DAILY WITH SUPPER.  [name] HYDROcodone-acetaminophen (NORCO/VICODIN) 5-325 MG tablet Take 1-2 tablets by mouth every 4 (four) hours as needed.   No facility-administered encounter medications on file as of 09/26/2018.   :   Review of Systems:  Out of a complete 14 point review of systems, all are reviewed and negative with the exception of these symptoms as listed below: Review of Systems  Neurological:  Pt presents today to discuss her PN. She has numbness in her feet. Dr. Philip Myers prescribed gabapentin but she is afraid to take it because of potential side effects.    Objective:  Neurological Exam  Physical Exam Physical Examination:   Vitals:   09/26/18 0947  BP: 131/63  Pulse: 70    General Examination: The patient is a very pleasant 78 y.o. female in no acute distress. She brought her wheelchair.  She did not bring her walker.  She has lost weight.  She looks more deconditioned.  She is well-groomed, oxygen via oxygen concentrator at 4 L/min.   HEENT: Normocephalic, atraumatic, pupils are equal, round and reactive to light and accommodation. Extraocular tracking is good without limitation to gaze excursion or nystagmus  noted. Normal smooth pursuit is noted. Hearing is grossly intact. Face is symmetric with normal facial animation and normal facial sensation. Speech is clear with no dysarthria noted. There is no hypophonia. There is no lip, neck/head, jaw or voice tremor. Neck is supple with full range of passive and active motion. There are no carotid bruits on auscultation. Oropharynx exam reveals: mild mouth dryness, adequate dental hygiene. Tongue protrudes centrally and palate elevates symmetrically. No significant nasal mucosal bogginess or redness and no septal deviation.   Chest: Clear to auscultation without wheezing, rhonchi or crackles noted.  Heart: S1+S2+0, regular and normal without murmurs, rubs or gallops noted.   Abdomen: Soft, non-tender and non-distended with normal bowel sounds appreciated on auscultation.  Extremities: There is no pitting edema in the distal lower extremities bilaterally. Pedal pulses are intact.  Skin: Warm and dry without trophic changes noted, Slight erythematous and cold her feet bilaterally, pedal pulses are intact.  Musculoskeletal: exam reveals no obvious joint deformities, tenderness or joint swelling or erythema.   Neurologically:  Mental status: The patient is awake, alert and oriented in all 4 spheres. Her immediate and remote memory, attention, language skills and fund of knowledge are appropriate. There is no evidence of aphasia, agnosia, apraxia or anomia. Speech is clear with normal prosody and enunciation. Thought process is linear. Mood is normal and affect is normal.  Cranial nerves II - XII are as described above under HEENT exam. In addition: shoulder shrug is normal with equal shoulder height noted. Motor exam: Thin built, global strength of 4 out of 5, weaker in both feet with foot dorsiflexion, slight foot drop noted, right worse than left, reflexes are 1+ in the upper extremities, no fasciculations, no focal atrophy, Knee reflex is 1-2+ on the left,  trace on the right, absent ankle reflex on the right and trace on the left.  Toes are downgoing.  Muscle tone is normal, slightly reduced perhaps in the lower extremities.  Romberg is negative, As she cannot stand unsupported narrow based.Fine motor skills are globally mildly impaired.  Cerebellar testing: No dysmetria or intention tremor.  Sensory exam: intact to All modalities in the upper extremities, decreased to pinprick, and temperature sense in the distal lower extremities, right side up to upper two thirds of the shin, left side up to above ankle area proximately. She reports discomfort in the balls of her feet. Gait, station and balance: She stands with difficulty, she requires assistance.  She stands wide-based and walks with assistance from her husband slowly and and securely, she has difficulty with turns, she did not bring her walker and was not able to walk without assistance.   Assessment and plan:   In summary, Erin Myers is a  very pleasant 78 y.o.-year old female with an underlying medical history of hypertension, hyperlipidemia, hypothyroidism, lung cancer, ovarian cancer, anxiety, palpitations, diabetes and mildly overweight state with prior history of obesity, who reports numbness and pain in both feet for the past 2 or 3 months.  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 Her chemotherapy regimen included carboplatin.  Certain chemotherapy agents can cause neuropathy in some patients.  She is advised that diabetes and even prediabetes can cause neuropathy as well.  Her examination findings are in keeping with peripheral neuropathy.  She has had some pain.  She was given a prescription for gabapentin but has not tried it yet for fear of side effects.  She is encouraged to try at an low dose just at night with gradual build up to up to 3 times daily 100 mg strength. She has  her prescription filled already.I suggested We proceed with diagnostic testing including blood work today and we will schedule EMG and nerve conduction velocity testing in our office.  We will keep her posted as to her test results and I will see her in follow-up if needed.  Thank you very much for allowing me to participate in the care of this nice patient. If I can be of any further assistance to you please do not hesitate to call me at [phone].  Sincerely,   Erin Age, MD, PhD

## 2018-09-26 NOTE — Patient Instructions (Addendum)
I would like to investigate things further to look for evidence of neuropathy or nerve damage; therefore, I would like to do some blood work, and electrical testing of your muscles and nerves, which is known as EMG/NCV. Neuropathy or nerve disease or damage can be caused by a variety of causes, most commonly diabetes, some toxins including alcohol or metabolic derangements or hereditary disorders or certain chemotherapy agents.   You have had some blood work in June and regular blood work through Oncology and it appears to have some other blood work pending.  We will call you with the blood test we are doing today here.  We will also keep you posted as to your test result on the muscle and nerve test in our office.  I will see you back as needed.  You may benefit from symptomatic treatment with gabapentin as prescribed by Dr. Philip Aspen. Take 1 pill nightly at bedtime and increase to up to 3 times a day as needed. The most common side effects reported are sedation or sleepiness. Rare side effects include balance problems, confusion.

## 2018-09-30 LAB — SEDIMENTATION RATE: Sed Rate: 29 mm/hr (ref 0–40)

## 2018-09-30 LAB — CK: Total CK: 36 U/L (ref 32–182)

## 2018-09-30 LAB — RPR: RPR Ser Ql: NONREACTIVE

## 2018-09-30 LAB — C-REACTIVE PROTEIN: CRP: 7 mg/L (ref 0–10)

## 2018-09-30 LAB — VITAMIN B6: Vitamin B6: 12.2 ug/L (ref 2.0–32.8)

## 2018-09-30 LAB — ANA W/REFLEX: Anti Nuclear Antibody (ANA): NEGATIVE

## 2018-09-30 LAB — VITAMIN B1: Thiamine: 128.9 nmol/L (ref 66.5–200.0)

## 2018-10-01 ENCOUNTER — Telehealth: Payer: Self-pay

## 2018-10-01 NOTE — Telephone Encounter (Signed)
-----   Message from Star Age, MD sent at 10/01/2018  8:07 AM EDT ----- Labs were normal; we will await EMG/NCV test. Michel Bickers

## 2018-10-01 NOTE — Telephone Encounter (Signed)
I called pt, advised her of lab results, will wait for NCV/EMG results. Pt verbalized understanding of results. Pt had no questions at this time but was encouraged to call back if questions arise.

## 2018-10-01 NOTE — Progress Notes (Signed)
Labs were normal; we will await EMG/NCV test. Erin Myers

## 2018-10-07 DIAGNOSIS — J449 Chronic obstructive pulmonary disease, unspecified: Secondary | ICD-10-CM | POA: Diagnosis not present

## 2018-10-25 ENCOUNTER — Other Ambulatory Visit: Payer: Self-pay

## 2018-10-25 ENCOUNTER — Inpatient Hospital Stay: Payer: PPO | Attending: Internal Medicine

## 2018-10-25 ENCOUNTER — Inpatient Hospital Stay: Payer: PPO

## 2018-10-25 DIAGNOSIS — I2510 Atherosclerotic heart disease of native coronary artery without angina pectoris: Secondary | ICD-10-CM | POA: Diagnosis not present

## 2018-10-25 DIAGNOSIS — I10 Essential (primary) hypertension: Secondary | ICD-10-CM | POA: Diagnosis not present

## 2018-10-25 DIAGNOSIS — Z95828 Presence of other vascular implants and grafts: Secondary | ICD-10-CM

## 2018-10-25 DIAGNOSIS — E039 Hypothyroidism, unspecified: Secondary | ICD-10-CM | POA: Insufficient documentation

## 2018-10-25 DIAGNOSIS — G4730 Sleep apnea, unspecified: Secondary | ICD-10-CM | POA: Insufficient documentation

## 2018-10-25 DIAGNOSIS — J90 Pleural effusion, not elsewhere classified: Secondary | ICD-10-CM | POA: Insufficient documentation

## 2018-10-25 DIAGNOSIS — Z79899 Other long term (current) drug therapy: Secondary | ICD-10-CM | POA: Diagnosis not present

## 2018-10-25 DIAGNOSIS — E119 Type 2 diabetes mellitus without complications: Secondary | ICD-10-CM | POA: Insufficient documentation

## 2018-10-25 DIAGNOSIS — I700 Atherosclerosis of aorta: Secondary | ICD-10-CM | POA: Diagnosis not present

## 2018-10-25 DIAGNOSIS — Z9221 Personal history of antineoplastic chemotherapy: Secondary | ICD-10-CM | POA: Insufficient documentation

## 2018-10-25 DIAGNOSIS — F419 Anxiety disorder, unspecified: Secondary | ICD-10-CM | POA: Insufficient documentation

## 2018-10-25 DIAGNOSIS — C3481 Malignant neoplasm of overlapping sites of right bronchus and lung: Secondary | ICD-10-CM | POA: Insufficient documentation

## 2018-10-25 DIAGNOSIS — E669 Obesity, unspecified: Secondary | ICD-10-CM | POA: Diagnosis not present

## 2018-10-25 DIAGNOSIS — E785 Hyperlipidemia, unspecified: Secondary | ICD-10-CM | POA: Insufficient documentation

## 2018-10-25 DIAGNOSIS — C3490 Malignant neoplasm of unspecified part of unspecified bronchus or lung: Secondary | ICD-10-CM

## 2018-10-25 LAB — CMP (CANCER CENTER ONLY)
ALT: 8 U/L (ref 0–44)
AST: 11 U/L — ABNORMAL LOW (ref 15–41)
Albumin: 3.1 g/dL — ABNORMAL LOW (ref 3.5–5.0)
Alkaline Phosphatase: 56 U/L (ref 38–126)
Anion gap: 8 (ref 5–15)
BUN: 31 mg/dL — ABNORMAL HIGH (ref 8–23)
CO2: 29 mmol/L (ref 22–32)
Calcium: 9.3 mg/dL (ref 8.9–10.3)
Chloride: 106 mmol/L (ref 98–111)
Creatinine: 1.36 mg/dL — ABNORMAL HIGH (ref 0.44–1.00)
GFR, Est AFR Am: 44 mL/min — ABNORMAL LOW (ref >60)
GFR, Estimated: 38 mL/min — ABNORMAL LOW (ref >60)
Glucose, Bld: 103 mg/dL — ABNORMAL HIGH (ref 70–99)
Potassium: 4.2 mmol/L (ref 3.5–5.1)
Sodium: 143 mmol/L (ref 135–145)
Total Bilirubin: 0.2 mg/dL — ABNORMAL LOW (ref 0.3–1.2)
Total Protein: 6.3 g/dL — ABNORMAL LOW (ref 6.5–8.1)

## 2018-10-25 LAB — VITAMIN B12: Vitamin B-12: 338 pg/mL (ref 180–914)

## 2018-10-25 LAB — CBC WITH DIFFERENTIAL (CANCER CENTER ONLY)
Abs Immature Granulocytes: 0.01 10*3/uL (ref 0.00–0.07)
Basophils Absolute: 0 10*3/uL (ref 0.0–0.1)
Basophils Relative: 0 %
Eosinophils Absolute: 0.2 10*3/uL (ref 0.0–0.5)
Eosinophils Relative: 2 %
HCT: 31.3 % — ABNORMAL LOW (ref 36.0–46.0)
Hemoglobin: 9.7 g/dL — ABNORMAL LOW (ref 12.0–15.0)
Immature Granulocytes: 0 %
Lymphocytes Relative: 20 %
Lymphs Abs: 1.5 10*3/uL (ref 0.7–4.0)
MCH: 32.9 pg (ref 26.0–34.0)
MCHC: 31 g/dL (ref 30.0–36.0)
MCV: 106.1 fL — ABNORMAL HIGH (ref 80.0–100.0)
Monocytes Absolute: 0.6 10*3/uL (ref 0.1–1.0)
Monocytes Relative: 8 %
Neutro Abs: 5.5 10*3/uL (ref 1.7–7.7)
Neutrophils Relative %: 70 %
Platelet Count: 165 10*3/uL (ref 150–400)
RBC: 2.95 MIL/uL — ABNORMAL LOW (ref 3.87–5.11)
RDW: 13.6 % (ref 11.5–15.5)
WBC Count: 7.9 10*3/uL (ref 4.0–10.5)
nRBC: 0 % (ref 0.0–0.2)

## 2018-10-25 LAB — IRON AND TIBC
Iron: 56 ug/dL (ref 41–142)
Saturation Ratios: 25 % (ref 21–57)
TIBC: 219 ug/dL — ABNORMAL LOW (ref 236–444)
UIBC: 163 ug/dL (ref 120–384)

## 2018-10-25 LAB — FERRITIN: Ferritin: 759 ng/mL — ABNORMAL HIGH (ref 11–307)

## 2018-10-25 LAB — FOLATE: Folate: 64.3 ng/mL (ref >5.9)

## 2018-10-25 MED ORDER — SODIUM CHLORIDE 0.9% FLUSH
10.0000 mL | INTRAVENOUS | Status: DC | PRN
  Administered 2018-10-25: 10 mL
  Filled 2018-10-25: qty 10

## 2018-10-25 MED ORDER — HEPARIN SOD (PORK) LOCK FLUSH 100 UNIT/ML IV SOLN
500.0000 [IU] | Freq: Once | INTRAVENOUS | Status: AC | PRN
  Administered 2018-10-25: 500 [IU]
  Filled 2018-10-25: qty 5

## 2018-10-26 ENCOUNTER — Ambulatory Visit (HOSPITAL_COMMUNITY)
Admission: RE | Admit: 2018-10-26 | Discharge: 2018-10-26 | Disposition: A | Discharge status: Home / Self Care | Payer: PPO | Source: Ambulatory Visit | Attending: Internal Medicine | Admitting: Internal Medicine

## 2018-10-26 ENCOUNTER — Other Ambulatory Visit: Payer: PPO

## 2018-10-26 DIAGNOSIS — J90 Pleural effusion, not elsewhere classified: Secondary | ICD-10-CM | POA: Diagnosis not present

## 2018-10-26 DIAGNOSIS — C3490 Malignant neoplasm of unspecified part of unspecified bronchus or lung: Secondary | ICD-10-CM | POA: Insufficient documentation

## 2018-10-26 DIAGNOSIS — N2889 Other specified disorders of kidney and ureter: Secondary | ICD-10-CM | POA: Diagnosis not present

## 2018-10-26 MED ORDER — SODIUM CHLORIDE (PF) 0.9 % IJ SOLN
INTRAMUSCULAR | Status: AC
  Filled 2018-10-26: qty 50

## 2018-10-26 MED ORDER — IOHEXOL 300 MG/ML  SOLN
75.0000 mL | Freq: Once | INTRAMUSCULAR | Status: AC | PRN
  Administered 2018-10-26: 75 mL via INTRAVENOUS

## 2018-10-29 ENCOUNTER — Inpatient Hospital Stay (HOSPITAL_BASED_OUTPATIENT_CLINIC_OR_DEPARTMENT_OTHER): Payer: PPO | Admitting: Internal Medicine

## 2018-10-29 ENCOUNTER — Encounter: Payer: Self-pay | Admitting: Internal Medicine

## 2018-10-29 ENCOUNTER — Other Ambulatory Visit: Payer: Self-pay

## 2018-10-29 ENCOUNTER — Telehealth: Payer: Self-pay | Admitting: Internal Medicine

## 2018-10-29 ENCOUNTER — Inpatient Hospital Stay: Payer: PPO

## 2018-10-29 VITALS — BP 128/43 | HR 66 | Temp 98.0°F | Resp 18 | Ht 64.0 in | Wt 152.6 lb

## 2018-10-29 DIAGNOSIS — I10 Essential (primary) hypertension: Secondary | ICD-10-CM | POA: Diagnosis not present

## 2018-10-29 DIAGNOSIS — C3490 Malignant neoplasm of unspecified part of unspecified bronchus or lung: Secondary | ICD-10-CM | POA: Diagnosis not present

## 2018-10-29 DIAGNOSIS — C3491 Malignant neoplasm of unspecified part of right bronchus or lung: Secondary | ICD-10-CM

## 2018-10-29 DIAGNOSIS — C3481 Malignant neoplasm of overlapping sites of right bronchus and lung: Secondary | ICD-10-CM | POA: Diagnosis not present

## 2018-10-29 NOTE — Progress Notes (Signed)
[place] [phone]   [phone]  OFFICE PROGRESS NOTE  [name], MD [place] [address]  DIAGNOSIS:  1) Stage IV (T3, N0, M1 a)non-small cell lung cancer, well-differentiated adenocarcinoma presented with multifocal disease involving mainly the right upper lobe as well as the right middle lobe and left lower lobe of the lung diagnosed in May 2019. 2) bilateral small pulmonary emboli diagnosed incidentally on CT scan of the chest on October 16, 2017  Guardant 360 testing was negative for actionable mutations.  PRIOR THERAPY:None  CURRENT THERAPY: 1) Carboplatin for an AUC of 5, Alimta 500 mg/m2 and Keytruda 200 mg IV given every 3 weeks. First dosegiven on 08/18/2017.Status post 15 cycles.  Starting from cycle #5 the patient is on maintenance Alimta and Ketruda (pembrolizumab) every 3 weeks. 2) Xarelto 15 mg p.o. twice daily for the first 3 weeks followed by 20 mg p.o. Daily.  INTERVAL HISTORY: Erin Myers 78 y.o. female returns to the clinic today for 3 months follow-up visit.  The patient is feeling fine today with no concerning complaints except for the baseline shortness of breath increased with exertion.  She denied having any chest pain but has mild cough with no hemoptysis.  She denied having any fever or chills.  She has no nausea, vomiting, diarrhea or constipation.  She denied having any headache or visual changes.  She has no weight loss or night sweats.  The patient had repeat CT scan of the chest, abdomen pelvis performed recently and she is here for evaluation and discussion of her scan results.  MEDICAL HISTORY: Past Medical History:  Diagnosis Date  . Anxiety   . Diabetes mellitus without complication ([place])   . Dyslipidemia   . HTN (hypertension)   . Hyperlipemia   . Hypothyroid   . Hypothyroidism   . Lung cancer ([place]) 2000   Carcinoid, s/p RLL lobectomy  . Obesity   . Ovarian  cancer ([place])   . Palpitations 2009 and 2006   Negative nuclear stress test   . Sleep apnea    on C-pap    ALLERGIES:  is allergic to adhesive [tape]; codeine; lactose intolerance (gi); monosodium glutamate; and sulfa antibiotics.  MEDICATIONS:  Current Outpatient Medications  Medication Sig Dispense Refill  . acetaminophen (TYLENOL) 500 MG tablet Take 500 mg by mouth every 6 (six) hours as needed.    . benzonatate (TESSALON) 200 MG capsule Take 1 capsule (200 mg total) by mouth 3 (three) times daily as needed for cough. 30 capsule 1  . cholecalciferol (VITAMIN D3) 25 MCG (1000 UT) tablet Take 1,000 Units by mouth daily.    . clorazepate (TRANXENE) 7.5 MG tablet Take 7.5 mg by mouth 2 (two) times daily as needed for anxiety.    . docusate sodium (COLACE) 100 MG capsule Take 100 mg by mouth 2 (two) times daily.    . folic acid (FOLVITE) 1 MG tablet TAKE 1 TABLET BY MOUTH DAILY. 30 tablet 2  . guaiFENesin-codeine (CHERATUSSIN AC) 100-10 MG/5ML syrup Take 5 mLs by mouth every 6 (six) hours as needed for cough. 240 mL 0  . ipratropium-albuterol (DUONEB) 0.5-2.5 (3) MG/3ML SOLN INHALE THE CONTENTS OF 1 VIAL VIA NEBULIZER 3 TIMES A DAY. 360 mL 2  . levothyroxine (SYNTHROID, LEVOTHROID) 175 MCG tablet Take 175 mcg by mouth daily before breakfast.    . lidocaine-prilocaine (EMLA) cream Apply 1 application topically as needed. 30 g 0  . metoprolol  succinate (TOPROL-XL) 50 MG 24 hr tablet Take 50 mg by mouth 2 (two) times daily.     . Multiple Vitamins-Minerals (VISION FORMULA 2 PO) Take by mouth. OTC "Vision pills for Macular degeneration"    . prochlorperazine (COMPAZINE) 10 MG tablet Take 1 tablet (10 mg total) by mouth every 6 (six) hours as needed for nausea or vomiting. 30 tablet 0  . rosuvastatin (CRESTOR) 10 MG tablet Take 10 mg by mouth every evening.     . SYMBICORT 160-4.5 MCG/ACT inhaler INHALE 2 PUFFS INTO THE LUNGS 2 TIMES DAILY. 10.2 g 6  . valACYclovir (VALTREX) 500 MG tablet Take 500  mg by mouth daily.    [name] 20 MG TABS tablet TAKE 1 TABLET BY MOUTH DAILY WITH SUPPER. 30 tablet 1  . HYDROcodone-acetaminophen (NORCO/VICODIN) 5-325 MG tablet Take 1-2 tablets by mouth every 4 (four) hours as needed. 30 tablet 0   No current facility-administered medications for this visit.     SURGICAL HISTORY:  Past Surgical History:  Procedure Laterality Date  . ABDOMINAL HYSTERECTOMY  2001  . GRADED EXERCISE TOLERANCE TEST  08/2015    Blood pressure demonstrated a hypertensive response to exercise.  There was less than 0.65mm of horizontal ST segment depression in the inferolateral leads. No diagnostic criterial for ischemia. Moderately impaired exercise tolerance. The patient achieved 5.8 mets. This is a low risk study.  . IR FLUORO GUIDE PORT INSERTION RIGHT  08/17/2017  . IR THORACENTESIS ASP PLEURAL SPACE W/IMG GUIDE  07/04/2018  . IR US GUIDE VASC ACCESS RIGHT  08/17/2017  . LOBECTOMY  2000   RLL  . NM MYOCAR PERF WALL MOTION  08/20/2007   EF 74%  EXERCISE CAPACITY 7 METS  . OOPHORECTOMY  2001  . TONSILLECTOMY    . VIDEO BRONCHOSCOPY Bilateral 07/20/2017   Procedure: VIDEO BRONCHOSCOPY WITH FLUORO;  Surgeon: Collene Gobble, MD;  Location: [facility];  Service: Cardiopulmonary;  Laterality: Bilateral;    REVIEW OF SYSTEMS:  A comprehensive review of systems was negative except for: Constitutional: positive for fatigue Respiratory: positive for cough and dyspnea on exertion Musculoskeletal: positive for muscle weakness   PHYSICAL EXAMINATION: General appearance: alert, cooperative, fatigued, no distress and moderately obese Head: Normocephalic, without obvious abnormality, atraumatic Neck: no adenopathy, no JVD, supple, symmetrical, trachea midline and thyroid not enlarged, symmetric, no tenderness/mass/nodules Lymph nodes: Cervical, supraclavicular, and axillary nodes normal. Resp: clear to auscultation bilaterally Back: symmetric, no curvature. ROM normal. No CVA  tenderness. Cardio: regular rate and rhythm, S1, S2 normal, no murmur, click, rub or gallop GI: soft, non-tender; bowel sounds normal; no masses,  no organomegaly Extremities: extremities normal, atraumatic, no cyanosis or edema  ECOG PERFORMANCE STATUS: 2 - Symptomatic, <50% confined to bed  Blood pressure (!) 128/43, pulse 66, temperature 98 F (36.7 C), temperature source Oral, resp. rate 18, height 5\' 4"  (1.626 m), weight 152 lb 9.6 oz (69.2 kg), SpO2 100 %.  LABORATORY DATA: Lab Results  Component Value Date   WBC 7.9 10/25/2018   HGB 9.7 (L) 10/25/2018   HCT 31.3 (L) 10/25/2018   MCV 106.1 (H) 10/25/2018   PLT 165 10/25/2018      Chemistry      Component Value Date/Time   NA 143 10/25/2018 1026   K 4.2 10/25/2018 1026   CL 106 10/25/2018 1026   CO2 29 10/25/2018 1026   BUN 31 (H) 10/25/2018 1026   CREATININE 1.36 (H) 10/25/2018 1026      Component Value  Date/Time   CALCIUM 9.3 10/25/2018 1026   ALKPHOS 56 10/25/2018 1026   AST 11 (L) 10/25/2018 1026   ALT 8 10/25/2018 1026   BILITOT 0.2 (L) 10/25/2018 1026       RADIOGRAPHIC STUDIES: Ct Chest W Contrast  Result Date: 10/26/2018 CLINICAL DATA:  Non-small-cell lung cancer, restaging. Chemotherapy and immunotherapy complete. Right lower lobectomy. Hysterectomy. No current complaints. EXAM: CT CHEST, ABDOMEN, AND PELVIS WITH CONTRAST TECHNIQUE: Multidetector CT imaging of the chest, abdomen and pelvis was performed following the standard protocol during bolus administration of intravenous contrast. CONTRAST:  44mL OMNIPAQUE IOHEXOL 300 MG/ML  SOLN COMPARISON:  08/23/2018 FINDINGS: CT CHEST FINDINGS Cardiovascular: Right Port-A-Cath tip at mid right atrium. Aortic and branch vessel atherosclerosis. Tortuous thoracic aorta. Mild cardiomegaly with minimal pericardial fluid, likely physiologic. Left circumflex coronary artery calcification. No central pulmonary embolism, on this non-dedicated study. Mediastinum/Nodes: No  supraclavicular adenopathy. A node within the azygoesophageal recess measures 1.2 x 1.6 cm on 32/2 and is not significantly changed. No hilar adenopathy. Left infrahilar node is upper normal sized at 7 mm and not significantly changed. Lungs/Pleura: Further decrease in trace right pleural fluid with persistent pleural thickening. Presumed secretions in the nondependent trachea, right mainstem bronchus, and bronchus intermedius. Lower lobe predominant bronchial wall thickening. Superior segment right lower lobe 3 mm nodule on image 65/7 may have enlarged compared to the prior exam. Right lower lobe 5 mm nodule on image 75/7, new or enlarged. Tiny left lower lobe pulmonary nodules, including on 110/7. New or more conspicuous today. A left lower lobe index nodule measures 4 mm on 94/7 and is likely enlarged from 2 mm on the prior. Posterior right upper lobe airspace and ground-glass opacity is similar and may be radiation induced. Mild dependent right lower lobe subsegmental atelectasis and volume loss. Musculoskeletal: No acute osseous abnormality. CT ABDOMEN PELVIS FINDINGS Hepatobiliary: Normal liver. Normal gallbladder, without biliary ductal dilatation. Pancreas: Normal, without mass or ductal dilatation. Spleen: Normal in size, without focal abnormality. Adrenals/Urinary Tract: Normal adrenal glands. Exophytic interpolar left renal lesion measures 11 mm on image 73/2 and has enlarged from 7 mm on the prior exam. Fluid density today versus hyperattenuating on the prior. Left extrarenal pelvis. Too small to characterize lower pole right renal lesion. Normal urinary bladder. Stomach/Bowel: Normal stomach, without wall thickening. Normal colon and terminal ileum. Normal small bowel. Vascular/Lymphatic: Advanced aortic and branch vessel atherosclerosis. No abdominopelvic adenopathy. Reproductive: Hysterectomy.  No adnexal mass. Other: No significant free fluid. Mild pelvic floor laxity. No evidence of omental or  peritoneal disease. Musculoskeletal: No acute osseous abnormality. IMPRESSION: 1. Bilateral pulmonary nodules, many of which are felt to be new and/or enlarged. These are suspicious for pulmonary metastasis. An infectious or inflammatory etiology is felt less likely. None of these nodules are large enough to be evaluated by PET or sampled. Consider chest CT follow-up at 3-6 months. 2. Similar adenopathy within the azygoesophageal recess 3. No acute process or evidence of metastatic disease in the abdomen or pelvis. 4. Decreased small right pleural effusion. 5. Enlargement of a left renal lesion which currently measures fluid density, favoring a cyst. 6. Coronary artery atherosclerosis. Aortic Atherosclerosis (ICD10-I70.0). Electronically Signed   By: Abigail Miyamoto M.D.   On: 10/26/2018 13:11   Ct Abdomen Pelvis W Contrast  Result Date: 10/26/2018 CLINICAL DATA:  Non-small-cell lung cancer, restaging. Chemotherapy and immunotherapy complete. Right lower lobectomy. Hysterectomy. No current complaints. EXAM: CT CHEST, ABDOMEN, AND PELVIS WITH CONTRAST TECHNIQUE: Multidetector CT imaging of  the chest, abdomen and pelvis was performed following the standard protocol during bolus administration of intravenous contrast. CONTRAST:  60mL OMNIPAQUE IOHEXOL 300 MG/ML  SOLN COMPARISON:  08/23/2018 FINDINGS: CT CHEST FINDINGS Cardiovascular: Right Port-A-Cath tip at mid right atrium. Aortic and branch vessel atherosclerosis. Tortuous thoracic aorta. Mild cardiomegaly with minimal pericardial fluid, likely physiologic. Left circumflex coronary artery calcification. No central pulmonary embolism, on this non-dedicated study. Mediastinum/Nodes: No supraclavicular adenopathy. A node within the azygoesophageal recess measures 1.2 x 1.6 cm on 32/2 and is not significantly changed. No hilar adenopathy. Left infrahilar node is upper normal sized at 7 mm and not significantly changed. Lungs/Pleura: Further decrease in trace right  pleural fluid with persistent pleural thickening. Presumed secretions in the nondependent trachea, right mainstem bronchus, and bronchus intermedius. Lower lobe predominant bronchial wall thickening. Superior segment right lower lobe 3 mm nodule on image 65/7 may have enlarged compared to the prior exam. Right lower lobe 5 mm nodule on image 75/7, new or enlarged. Tiny left lower lobe pulmonary nodules, including on 110/7. New or more conspicuous today. A left lower lobe index nodule measures 4 mm on 94/7 and is likely enlarged from 2 mm on the prior. Posterior right upper lobe airspace and ground-glass opacity is similar and may be radiation induced. Mild dependent right lower lobe subsegmental atelectasis and volume loss. Musculoskeletal: No acute osseous abnormality. CT ABDOMEN PELVIS FINDINGS Hepatobiliary: Normal liver. Normal gallbladder, without biliary ductal dilatation. Pancreas: Normal, without mass or ductal dilatation. Spleen: Normal in size, without focal abnormality. Adrenals/Urinary Tract: Normal adrenal glands. Exophytic interpolar left renal lesion measures 11 mm on image 73/2 and has enlarged from 7 mm on the prior exam. Fluid density today versus hyperattenuating on the prior. Left extrarenal pelvis. Too small to characterize lower pole right renal lesion. Normal urinary bladder. Stomach/Bowel: Normal stomach, without wall thickening. Normal colon and terminal ileum. Normal small bowel. Vascular/Lymphatic: Advanced aortic and branch vessel atherosclerosis. No abdominopelvic adenopathy. Reproductive: Hysterectomy.  No adnexal mass. Other: No significant free fluid. Mild pelvic floor laxity. No evidence of omental or peritoneal disease. Musculoskeletal: No acute osseous abnormality. IMPRESSION: 1. Bilateral pulmonary nodules, many of which are felt to be new and/or enlarged. These are suspicious for pulmonary metastasis. An infectious or inflammatory etiology is felt less likely. None of these  nodules are large enough to be evaluated by PET or sampled. Consider chest CT follow-up at 3-6 months. 2. Similar adenopathy within the azygoesophageal recess 3. No acute process or evidence of metastatic disease in the abdomen or pelvis. 4. Decreased small right pleural effusion. 5. Enlargement of a left renal lesion which currently measures fluid density, favoring a cyst. 6. Coronary artery atherosclerosis. Aortic Atherosclerosis (ICD10-I70.0). Electronically Signed   By: Abigail Miyamoto M.D.   On: 10/26/2018 13:11     ASSESSMENT AND PLAN: This is a very pleasant 78 years old white female with stage IV non-small cell lung cancer, adenocarcinoma with no actionable mutations. She is currently undergoing systemic chemotherapy with carboplatin, Alimta and Ketruda (pembrolizumab) status post 15 cycles.  Starting from cycle #5 she is on maintenance treatment with Alimta and Ketruda (pembrolizumab). The patient has been tolerating this treatment well but recently she has more fatigue, weakness as well as weight loss. The patient is currently on observation and she is feeling fine except for the baseline shortness of breath and fatigue. She had repeat CT scan of the chest, abdomen pelvis performed recently.  I personally and independently reviewed the scan images and  discussed the results with the patient today. Her scan showed multiple new and enlarging pulmonary nodules suspicious for pulmonary metastasis versus inflammatory process. I discussed the scan results with the patient and gave her the option of resuming her treatment with chemotherapy versus continuous observation and close monitoring with repeat imaging studies in 3 months. The patient would like to continue on observation for now. I will see her back for follow-up visit in 3 months with repeat CT scan of the chest, abdomen and pelvis for restaging of her disease. She was advised to call immediately if she has any concerning symptoms in the  interval. The patient voices understanding of current disease status and treatment options and is in agreement with the current care plan. All questions were answered. The patient knows to call the clinic with any problems, questions or concerns. We can certainly see the patient much sooner if necessary.  Disclaimer: This note was dictated with voice recognition software. Similar sounding words can inadvertently be transcribed and may not be corrected upon review.

## 2018-10-29 NOTE — Telephone Encounter (Signed)
Scheduled appt per 8/17 los - mailed letter with appt date and time . Central radiology to contact pt with scan appt.

## 2018-11-01 ENCOUNTER — Encounter: Payer: Self-pay | Admitting: Internal Medicine

## 2018-11-07 DIAGNOSIS — J449 Chronic obstructive pulmonary disease, unspecified: Secondary | ICD-10-CM | POA: Diagnosis not present

## 2018-11-08 ENCOUNTER — Other Ambulatory Visit: Payer: Self-pay

## 2018-11-08 ENCOUNTER — Ambulatory Visit (INDEPENDENT_AMBULATORY_CARE_PROVIDER_SITE_OTHER): Payer: PPO | Admitting: Diagnostic Neuroimaging

## 2018-11-08 ENCOUNTER — Encounter: Payer: PPO | Admitting: Diagnostic Neuroimaging

## 2018-11-08 DIAGNOSIS — G629 Polyneuropathy, unspecified: Secondary | ICD-10-CM | POA: Diagnosis not present

## 2018-11-08 DIAGNOSIS — Z0289 Encounter for other administrative examinations: Secondary | ICD-10-CM

## 2018-11-08 DIAGNOSIS — Z23 Encounter for immunization: Secondary | ICD-10-CM | POA: Diagnosis not present

## 2018-11-08 DIAGNOSIS — M79671 Pain in right foot: Secondary | ICD-10-CM

## 2018-11-12 ENCOUNTER — Other Ambulatory Visit: Payer: Self-pay | Admitting: Internal Medicine

## 2018-11-12 DIAGNOSIS — I2699 Other pulmonary embolism without acute cor pulmonale: Secondary | ICD-10-CM

## 2018-11-12 NOTE — Procedures (Signed)
[facility]  NCS (NERVE CONDUCTION STUDY) WITH EMG (ELECTROMYOGRAPHY) REPORT   STUDY DATE: 11/12/18 PATIENT NAME: Erin Myers DOB: 11/09/1940 MRN: [id number]  ORDERING CLINICIAN: Star Age, MD PhD   TECHNOLOGIST: Sherre Scarlet ELECTROMYOGRAPHER: Earlean Polka. Abbagail Scaff, MD  CLINICAL INFORMATION: 78 year old female here for evaluation of lower extremity numbness and pain since April 2020.  Patient was on chemotherapy from June 2019 until March 2020.  FINDINGS: NERVE CONDUCTION STUDY:  Bilateral peroneal and tibial motor responses have normal distal latencies, decreased amplitudes, slow conduction velocities.  Left peroneal motor response conduction velocity could not be calculated.  Bilateral sural and superficial peroneal sensory sponsors have decreased amplitudes.   NEEDLE ELECTROMYOGRAPHY: Needle examination of right lower extremity notable for abnormal spontaneous activity in the right tibialis anterior at rest with decreased motor unit recruitment on exertion.  Right vastus medialis and right gastrocnemius muscles are normal.   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   INTERPRETING PHYSICIAN:  Penni Bombard, MD Certified in Neurology, Neurophysiology and Neuroimaging  [facility] [address], [place],  [address] [phone]    [facility]    Nerve / Sites Muscle Latency Ref. Amplitude Ref. Rel Amp Segments Distance Velocity Ref. Area    ms ms mV mV %  cm m/s m/s mVms  R Peroneal - EDB     Ankle EDB 5.5 ?6.5 0.2 ?2.0 100 Ankle - EDB 9   1.3     Fib head EDB 14.3  0.2  101 Fib head - Ankle 28 32 ?44 0.9     Pop fossa EDB 17.5  0.3  112 Pop fossa - Fib head 10 31 ?44 0.9         Pop fossa - Ankle      L Peroneal - EDB     Ankle EDB 6.5 ?6.5 0.1 ?2.0 100 Ankle - EDB 9   0.4     Fib head EDB NR  NR  NR Fib head - Ankle 27 NR ?44 NR     Pop fossa EDB 17.6  0.1    Pop fossa - Fib head 10 NR ?44 0.5         Pop fossa - Ankle      R Tibial - AH     Ankle AH 5.0 ?5.8 1.0 ?4.0 100 Ankle - AH 9   3.7     Pop fossa AH 15.6  0.8  75.8 Pop fossa - Ankle 36 34 ?41 2.3  L Tibial - AH     Ankle AH 5.7 ?5.8 0.9 ?4.0 100 Ankle - AH 9   8.4     Pop fossa AH 16.7  0.9  108 Pop fossa - Ankle 35 32 ?41 6.8             SNC    Nerve / Sites Rec. Site Peak Lat Ref.  Amp Ref. Segments Distance    ms ms V V  cm  R Sural - Ankle (Calf)     Calf Ankle 4.4 ?4.4 3 ?6 Calf - Ankle 14  L Sural - Ankle (Calf)     Calf Ankle 4.2 ?4.4 2 ?6 Calf - Ankle 14  R Superficial peroneal - Ankle     Lat leg Ankle 4.3 ?4.4 2 ?6 Lat leg - Ankle 14  L Superficial peroneal - Ankle     Lat leg Ankle 4.3 ?4.4 2 ?6 Lat leg - Ankle 14  F  Wave    Nerve F Lat Ref.   ms ms  R Tibial - AH 62.1 ?56.0  L Tibial - AH 57.3 ?56.0         EMG full       EMG Summary Table    Spontaneous MUAP Recruitment  Muscle IA Fib PSW Fasc Other Amp Dur. Poly Pattern  R. Vastus medialis Normal None None None _______ Normal Normal Normal Normal  R. Tibialis anterior Normal 1+ 1+ None _______ Increased Normal Normal Reduced  R. Gastrocnemius (Medial head) Normal None None None _______ Normal Normal Normal Normal

## 2018-11-13 ENCOUNTER — Encounter: Payer: Self-pay | Admitting: Neurology

## 2018-11-13 ENCOUNTER — Telehealth: Payer: Self-pay

## 2018-11-13 NOTE — Telephone Encounter (Signed)
I called pt and discussed her EMG/NCV results and recommendations. Pt would like a sooner appt with Dr. Rexene Alberts. Pt is reluctant to start gabapentin because of the side effects and her history of lung cancer. An appt was scheduled for 12/11/18 at 9:30am. Pt verbalized understanding of results. Pt had no questions at this time but was encouraged to call back if questions arise.

## 2018-11-13 NOTE — Telephone Encounter (Signed)
-----   Message from Star Age, MD sent at 11/13/2018 10:02 AM EDT ----- Please call patient and advise her that her recent EMG and nerve conduction test, the nerve and muscle electrical test from last week confirms evidence of neuropathy.  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 unfortunately the test does not tell us the exact cause of neuropathy in any given case.  We had done some blood work to evaluate for treatable causes.  She had a prescription for gabapentin per PCP, which she was encouraged to start.  I would recommend follow-up with [place] in about 3 months, pls assist w appt. Michel Bickers

## 2018-11-13 NOTE — Progress Notes (Signed)
Please call patient and advise her that her recent EMG and nerve conduction test, the nerve and muscle electrical test from last week confirms evidence of neuropathy.  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 unfortunately the test does not tell us the exact cause of neuropathy in any given case.  We had done some blood work to evaluate for treatable causes.  She had a prescription for gabapentin per PCP, which she was encouraged to start.  I would recommend follow-up with [place] in about 3 months, pls assist w appt. Michel Bickers

## 2018-11-14 ENCOUNTER — Other Ambulatory Visit: Payer: Self-pay | Admitting: Pulmonary Disease

## 2018-11-15 DIAGNOSIS — G4733 Obstructive sleep apnea (adult) (pediatric): Secondary | ICD-10-CM | POA: Diagnosis not present

## 2018-11-15 MED ORDER — IPRATROPIUM-ALBUTEROL 0.5-2.5 (3) MG/3ML IN SOLN: RESPIRATORY_TRACT | 0 refills | Status: DC

## 2018-11-20 ENCOUNTER — Other Ambulatory Visit: Payer: Self-pay

## 2018-11-20 ENCOUNTER — Ambulatory Visit: Payer: PPO | Admitting: Pulmonary Disease

## 2018-11-20 ENCOUNTER — Other Ambulatory Visit: Payer: PPO

## 2018-11-20 ENCOUNTER — Encounter: Payer: Self-pay | Admitting: Pulmonary Disease

## 2018-11-20 ENCOUNTER — Ambulatory Visit: Payer: PPO | Admitting: Internal Medicine

## 2018-11-20 ENCOUNTER — Ambulatory Visit: Payer: PPO

## 2018-11-20 VITALS — BP 138/72 | HR 71 | Temp 97.2°F | Ht 65.0 in | Wt 156.6 lb

## 2018-11-20 DIAGNOSIS — J9611 Chronic respiratory failure with hypoxia: Secondary | ICD-10-CM | POA: Diagnosis not present

## 2018-11-20 DIAGNOSIS — Z7689 Persons encountering health services in other specified circumstances: Secondary | ICD-10-CM | POA: Diagnosis not present

## 2018-11-20 DIAGNOSIS — Z9989 Dependence on other enabling machines and devices: Secondary | ICD-10-CM | POA: Diagnosis not present

## 2018-11-20 DIAGNOSIS — J479 Bronchiectasis, uncomplicated: Secondary | ICD-10-CM | POA: Diagnosis not present

## 2018-11-20 DIAGNOSIS — C3491 Malignant neoplasm of unspecified part of right bronchus or lung: Secondary | ICD-10-CM | POA: Diagnosis not present

## 2018-11-20 DIAGNOSIS — Z859 Personal history of malignant neoplasm, unspecified: Secondary | ICD-10-CM

## 2018-11-20 DIAGNOSIS — G4733 Obstructive sleep apnea (adult) (pediatric): Secondary | ICD-10-CM

## 2018-11-20 MED ORDER — BUDESONIDE-FORMOTEROL FUMARATE 160-4.5 MCG/ACT IN AERO: 2.0000 | INHALATION_SPRAY | Freq: Two times a day (BID) | RESPIRATORY_TRACT | 11 refills | Status: DC

## 2018-11-20 MED ORDER — IPRATROPIUM-ALBUTEROL 0.5-2.5 (3) MG/3ML IN SOLN: RESPIRATORY_TRACT | 11 refills | Status: DC

## 2018-11-20 NOTE — Progress Notes (Signed)
Synopsis: Referred in September 2020 for establish care with new pulmonologist by [name], MD  Subjective:   PATIENT ID: Erin Myers GENDER: female DOB: January 28, 1941, MRN: [id number]  Chief Complaint  Patient presents with  . New Patient (Initial Visit)    Former Programmer, systems patient. Lung cancer. Denies chest pain. Bronch done by RB. Using 4L pulsed. Using Duoneb 3x/day and symbicort daily. Reports she constantly has drainage ane eyes constantly run.     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  Diagnosis of stage IV, T3, N0, M1 a, non-small cell lung cancer, well differentiated adenocarcinoma involving the right upper lobe, right middle lobe and left lower lobe of the lung diagnosed in May 2019.  Bilateral pulmonary emboli.  Patient of Dr. Earlie Server treated with carboplatinum, Alimta, Keytruda.  Currently on Xarelto.  Repeat CT imaging demonstrated multiple new enlarging pulmonary nodules concerning for pulmonary metastasis.  Last discussion with oncology included continuation of chemotherapy or repeat observation in 3 months.  Today she is doing well.  She does have posterior nasal drainage.  She has never used intranasal steroids before.  She occasionally has dryness in the right nose and will occasionally have bleeding.  Otherwise respiratory symptoms are stable.  She had does have dyspnea on exertion.  She uses her pulse oxygen tank regularly.  She would like to have a POC if possible.  This would help with her portability and travel.   Past Medical History:  Diagnosis Date  . Anxiety   . Diabetes mellitus without complication ([place])   . Dyslipidemia   . HTN (hypertension)   . Hyperlipemia   . Hypothyroid   . Hypothyroidism   . Lung cancer ([place]) 2000   Carcinoid, s/p RLL lobectomy  . Obesity   . Ovarian cancer ([place])   . Palpitations 2009 and 2006   Negative  nuclear stress test   . Sleep apnea    on C-pap     Family History  Problem Relation Age of Onset  . Coronary artery disease Mother 32       died of an MI  . Heart attack Mother   . Suicidality Sister   . CVA Brother 68  . CVA Father      Past Surgical History:  Procedure Laterality Date  . ABDOMINAL HYSTERECTOMY  2001  . GRADED EXERCISE TOLERANCE TEST  08/2015    Blood pressure demonstrated a hypertensive response to exercise.  There was less than 0.80mm of horizontal ST segment depression in the inferolateral leads. No diagnostic criterial for ischemia. Moderately impaired exercise tolerance. The patient achieved 5.8 mets. This is a low risk study.  . IR FLUORO GUIDE PORT INSERTION RIGHT  08/17/2017  . IR THORACENTESIS ASP PLEURAL SPACE W/IMG GUIDE  07/04/2018  . IR US GUIDE VASC ACCESS RIGHT  08/17/2017  . LOBECTOMY  2000   RLL  . NM MYOCAR PERF WALL MOTION  08/20/2007   EF 74%  EXERCISE CAPACITY 7 METS  . OOPHORECTOMY  2001  . TONSILLECTOMY    . VIDEO BRONCHOSCOPY Bilateral 07/20/2017   Procedure: VIDEO BRONCHOSCOPY WITH FLUORO;  Surgeon: Collene Gobble, MD;  Location: [facility];  Service: Cardiopulmonary;  Laterality: Bilateral;    Social History   Socioeconomic History  . Marital status: Married    Spouse name: Not on file  . Number of children: 2  . Years of education: HS  . Highest education  level: Not on file  Occupational History  . Not on file  Social Needs  . Financial resource strain: Not on file  . Food insecurity    Worry: Not on file    Inability: Not on file  . Transportation needs    Medical: Not on file    Non-medical: Not on file  Tobacco Use  . Smoking status: Former Smoker    Packs/day: 0.75    Years: 30.00    Pack years: 22.50    Types: Cigarettes    Quit date: 08/18/1991    Years since quitting: 27.2  . Smokeless tobacco: Never Used  Substance and Sexual Activity  . Alcohol use: No    Alcohol/week: 0.0 standard drinks  . Drug use: No   . Sexual activity: Yes    Partners: Male  Lifestyle  . Physical activity    Days per week: Not on file    Minutes per session: Not on file  . Stress: Not on file  Relationships  . Social Herbalist on phone: Not on file    Gets together: Not on file    Attends religious service: Not on file    Active member of club or organization: Not on file    Attends meetings of clubs or organizations: Not on file    Relationship status: Not on file  . Intimate partner violence    Fear of current or ex partner: Not on file    Emotionally abused: Not on file    Physically abused: Not on file    Forced sexual activity: Not on file  Other Topics Concern  . Not on file  Social History Narrative   1-2 glasses of tea a day    Married mother of 2 (daughter close to 28 and son 45-4 years old).  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     Allergies  Allergen Reactions  . Adhesive [Tape] Other (See Comments)    Irritates skin   . Codeine Other (See Comments)    Skin crawlinf feeling  . Lactose Intolerance (Gi) Diarrhea  . Monosodium Glutamate Diarrhea  . Sulfa Antibiotics Nausea And Vomiting     Outpatient Medications Prior to Visit  Medication Sig Dispense Refill  . acetaminophen (TYLENOL) 500 MG tablet Take 500 mg by mouth every 6 (six) hours as needed.    . benzonatate (TESSALON) 200 MG capsule Take 1 capsule (200 mg total) by mouth 3 (three) times daily as needed for cough. 30 capsule 1  . cholecalciferol (VITAMIN D3) 25 MCG (1000 UT) tablet Take 1,000 Units by mouth daily.    . clorazepate (TRANXENE) 7.5 MG tablet Take 7.5 mg by mouth 2 (two) times daily as needed for anxiety.    . docusate sodium (COLACE) 100 MG capsule Take 100 mg by mouth 2 (two) times daily.    . folic acid (FOLVITE) 1 MG tablet TAKE 1 TABLET BY MOUTH DAILY. 30 tablet 2  . guaiFENesin-codeine (CHERATUSSIN AC) 100-10 MG/5ML syrup Take 5 mLs  by mouth every 6 (six) hours as needed for cough. 240 mL 0  . ipratropium-albuterol (DUONEB) 0.5-2.5 (3) MG/3ML SOLN INHALE THE CONTENTS OF 1 VIAL VIA NEBULIZER 3 TIMES A DAY. 120 mL 0  . levothyroxine (SYNTHROID, LEVOTHROID) 175 MCG tablet Take 175 mcg by mouth daily before breakfast.    . lidocaine-prilocaine (EMLA) cream Apply 1 application topically as needed. 30 g 0  .  metoprolol succinate (TOPROL-XL) 50 MG 24 hr tablet Take 50 mg by mouth 2 (two) times daily.     . Multiple Vitamins-Minerals (VISION FORMULA 2 PO) Take by mouth. OTC "Vision pills for Macular degeneration"    . prochlorperazine (COMPAZINE) 10 MG tablet Take 1 tablet (10 mg total) by mouth every 6 (six) hours as needed for nausea or vomiting. 30 tablet 0  . rosuvastatin (CRESTOR) 10 MG tablet Take 10 mg by mouth every evening.     . SYMBICORT 160-4.5 MCG/ACT inhaler INHALE 2 PUFFS INTO THE LUNGS 2 TIMES DAILY. 10.2 g 6  . valACYclovir (VALTREX) 500 MG tablet Take 500 mg by mouth daily.    [name] 20 MG TABS tablet TAKE 1 TABLET BY MOUTH DAILY WITH SUPPER. 30 tablet 1  . HYDROcodone-acetaminophen (NORCO/VICODIN) 5-325 MG tablet Take 1-2 tablets by mouth every 4 (four) hours as needed. 30 tablet 0   No facility-administered medications prior to visit.     Review of Systems  Constitutional: Negative for chills, fever, malaise/fatigue and weight loss.  HENT: Positive for congestion. Negative for hearing loss, sore throat and tinnitus.        Posterior drainage  Eyes: Negative for blurred vision and double vision.  Respiratory: Positive for shortness of breath. Negative for cough, hemoptysis, sputum production, wheezing and stridor.   Cardiovascular: Negative for chest pain, palpitations, orthopnea, leg swelling and PND.  Gastrointestinal: Negative for abdominal pain, constipation, diarrhea, heartburn, nausea and vomiting.  Genitourinary: Negative for dysuria, hematuria and urgency.  Musculoskeletal: Negative for joint pain  and myalgias.  Skin: Negative for itching and rash.  Neurological: Negative for dizziness, tingling, weakness and headaches.  Endo/Heme/Allergies: Negative for environmental allergies. Does not bruise/bleed easily.  Psychiatric/Behavioral: Negative for depression. The patient is not nervous/anxious and does not have insomnia.   All other systems reviewed and are negative.    Objective:  Physical Exam Vitals signs reviewed.  Constitutional:      General: She is not in acute distress.    Appearance: She is well-developed.     Comments: Elderly  HENT:     Head: Normocephalic and atraumatic.  Eyes:     General: No scleral icterus.    Conjunctiva/sclera: Conjunctivae normal.     Pupils: Pupils are equal, round, and reactive to light.  Neck:     Musculoskeletal: Neck supple.     Vascular: No JVD.     Trachea: No tracheal deviation.  Cardiovascular:     Rate and Rhythm: Normal rate and regular rhythm.     Heart sounds: Normal heart sounds. No murmur.  Pulmonary:     Effort: Pulmonary effort is normal. No tachypnea, accessory muscle usage or respiratory distress.     Breath sounds: No stridor. No wheezing or rhonchi.     Comments: Crackles in the right base on inspiration Abdominal:     General: Bowel sounds are normal. There is no distension.     Palpations: Abdomen is soft.     Tenderness: There is no abdominal tenderness.  Musculoskeletal:        General: No tenderness.  Lymphadenopathy:     Cervical: No cervical adenopathy.  Skin:    General: Skin is warm and dry.     Capillary Refill: Capillary refill takes less than 2 seconds.     Findings: No rash.  Neurological:     Mental Status: She is alert and oriented to person, place, and time.  Psychiatric:  Behavior: Behavior normal.      Vitals:   11/20/18 1050  BP: 138/72  Pulse: 71  Temp: (!) 97.2 F (36.2 C)  TempSrc: Temporal  SpO2: 98%  Weight: 156 lb 9.6 oz (71 kg)  Height: 5\' 5"  (1.651 m)   98% on  2 liters pulse BMI Readings from Last 3 Encounters:  11/20/18 26.06 kg/m  10/29/18 26.19 kg/m  08/28/18 25.95 kg/m   Wt Readings from Last 3 Encounters:  11/20/18 156 lb 9.6 oz (71 kg)  10/29/18 152 lb 9.6 oz (69.2 kg)  08/28/18 151 lb 3.2 oz (68.6 kg)     CBC    Component Value Date/Time   WBC 7.9 10/25/2018 1026   WBC 15.2 (H) 08/17/2017 1155   RBC 2.95 (L) 10/25/2018 1026   HGB 9.7 (L) 10/25/2018 1026   HCT 31.3 (L) 10/25/2018 1026   PLT 165 10/25/2018 1026   MCV 106.1 (H) 10/25/2018 1026   MCH 32.9 10/25/2018 1026   MCHC 31.0 10/25/2018 1026   RDW 13.6 10/25/2018 1026   LYMPHSABS 1.5 10/25/2018 1026   MONOABS 0.6 10/25/2018 1026   EOSABS 0.2 10/25/2018 1026   BASOSABS 0.0 10/25/2018 1026    Chest Imaging: August 2020 CT imaging: Enlarging bilateral pulmonary nodules concerning for pulmonary metastasis small right-sided pleural effusion. The patient's images have been independently reviewed by me.    Pulmonary Functions Testing Results: No flowsheet data found.  FeNO: None   Pathology: None   Echocardiogram:  05/24/2017 - Normal EF, PA Peak 41 mmHg   Heart Catheterization: None     Assessment & Plan:     ICD-10-CM   1. Adenocarcinoma of right lung, stage 4 (HCC)  C34.91   2. Hx of malignant carcinoid tumor  Z85.9   3. OSA on CPAP  G47.33    Z99.89   4. Bronchiectasis without complication ([place])  C62.3   5. Establishing care with new doctor, encounter for  Z76.89   6. Chronic hypoxemic respiratory failure (HCC)  J96.11     Discussion:  78 year old female chronic hypoxemic respiratory failure.  Prior office spirometry with normal ratio and FEV1 FVC.  Hypoxemic respiratory failure in the setting of stage IV lung cancer, history of lobectomy and ongoing chemotherapy..  Also undergoing immunotherapy.  Patient here today to establish care with new primary pulmonologist.  Plan: We will qualify her for POC if possible. We will send new DME supplies.  Continue DME as needed for oxygen delivery needs. Refills of continued use of Symbicort. Refills of DuoNeb solution.  Patient to return to clinic in 4 months.  Greater than 50% of this patient's 25-minute of visit was been face-to-face discussing the above recommendations and treatment plan.  We also reviewed patient's CT imaging today in the office.    Current Outpatient Medications:  .  acetaminophen (TYLENOL) 500 MG tablet, Take 500 mg by mouth every 6 (six) hours as needed., Disp: , Rfl:  .  benzonatate (TESSALON) 200 MG capsule, Take 1 capsule (200 mg total) by mouth 3 (three) times daily as needed for cough., Disp: 30 capsule, Rfl: 1 .  cholecalciferol (VITAMIN D3) 25 MCG (1000 UT) tablet, Take 1,000 Units by mouth daily., Disp: , Rfl:  .  clorazepate (TRANXENE) 7.5 MG tablet, Take 7.5 mg by mouth 2 (two) times daily as needed for anxiety., Disp: , Rfl:  .  docusate sodium (COLACE) 100 MG capsule, Take 100 mg by mouth 2 (two) times daily., Disp: , Rfl:  .  folic acid (FOLVITE) 1 MG tablet, TAKE 1 TABLET BY MOUTH DAILY., Disp: 30 tablet, Rfl: 2 .  guaiFENesin-codeine (CHERATUSSIN AC) 100-10 MG/5ML syrup, Take 5 mLs by mouth every 6 (six) hours as needed for cough., Disp: 240 mL, Rfl: 0 .  ipratropium-albuterol (DUONEB) 0.5-2.5 (3) MG/3ML SOLN, INHALE THE CONTENTS OF 1 VIAL VIA NEBULIZER 3 TIMES A DAY., Disp: 120 mL, Rfl: 0 .  levothyroxine (SYNTHROID, LEVOTHROID) 175 MCG tablet, Take 175 mcg by mouth daily before breakfast., Disp: , Rfl:  .  lidocaine-prilocaine (EMLA) cream, Apply 1 application topically as needed., Disp: 30 g, Rfl: 0 .  metoprolol succinate (TOPROL-XL) 50 MG 24 hr tablet, Take 50 mg by mouth 2 (two) times daily. , Disp: , Rfl:  .  Multiple Vitamins-Minerals (VISION FORMULA 2 PO), Take by mouth. OTC "Vision pills for Macular degeneration", Disp: , Rfl:  .  prochlorperazine (COMPAZINE) 10 MG tablet, Take 1 tablet (10 mg total) by mouth every 6 (six) hours as needed for  nausea or vomiting., Disp: 30 tablet, Rfl: 0 .  rosuvastatin (CRESTOR) 10 MG tablet, Take 10 mg by mouth every evening. , Disp: , Rfl:  .  SYMBICORT 160-4.5 MCG/ACT inhaler, INHALE 2 PUFFS INTO THE LUNGS 2 TIMES DAILY., Disp: 10.2 g, Rfl: 6 .  valACYclovir (VALTREX) 500 MG tablet, Take 500 mg by mouth daily., Disp: , Rfl:  .  XARELTO 20 MG TABS tablet, TAKE 1 TABLET BY MOUTH DAILY WITH SUPPER., Disp: 30 tablet, Rfl: 1   Garner Nash, DO [facility] 11/20/2018 11:02 AM

## 2018-11-20 NOTE — Patient Instructions (Addendum)
  Thank you for visiting Dr. Valeta Harms at [facility]. Today we recommend the following:  Meds ordered this encounter  Medications  . ipratropium-albuterol (DUONEB) 0.5-2.5 (3) MG/3ML SOLN    Sig: INHALE THE CONTENTS OF 1 VIAL VIA NEBULIZER 3 TIMES A DAY.    Dispense:  120 mL    Refill:  11    Dx j47.9  . budesonide-formoterol (SYMBICORT) 160-4.5 MCG/ACT inhaler    Sig: Inhale 2 puffs into the lungs 2 (two) times daily.    Dispense:  10.2 g    Refill:  11   Return in about 4 months (around 03/22/2019).    Please do your part to reduce the spread of COVID-19.

## 2018-11-30 ENCOUNTER — Telehealth: Payer: Self-pay | Admitting: Pulmonary Disease

## 2018-11-30 NOTE — Telephone Encounter (Signed)
Spoke with pt, she is checking the status of her POC. I called [employer] and Bethanne Ginger stated that she would call pt to let her know the status. I called pt back to let her know and nothing further is needed.

## 2018-12-08 DIAGNOSIS — J449 Chronic obstructive pulmonary disease, unspecified: Secondary | ICD-10-CM | POA: Diagnosis not present

## 2018-12-11 ENCOUNTER — Other Ambulatory Visit: Payer: Self-pay

## 2018-12-11 ENCOUNTER — Ambulatory Visit (INDEPENDENT_AMBULATORY_CARE_PROVIDER_SITE_OTHER): Payer: PPO | Admitting: Neurology

## 2018-12-11 ENCOUNTER — Encounter: Payer: Self-pay | Admitting: Neurology

## 2018-12-11 ENCOUNTER — Telehealth: Payer: Self-pay | Admitting: Pulmonary Disease

## 2018-12-11 VITALS — BP 182/80 | HR 66 | Ht 65.0 in | Wt 159.0 lb

## 2018-12-11 DIAGNOSIS — G6289 Other specified polyneuropathies: Secondary | ICD-10-CM | POA: Diagnosis not present

## 2018-12-11 DIAGNOSIS — M79671 Pain in right foot: Secondary | ICD-10-CM | POA: Diagnosis not present

## 2018-12-11 DIAGNOSIS — M79672 Pain in left foot: Secondary | ICD-10-CM | POA: Diagnosis not present

## 2018-12-11 NOTE — Patient Instructions (Signed)
As you know, we have not found any treatable cause of your neuropathy.  I am glad to hear that you are not in severe pain and that you have found some relief with CBD ointment, and with CBD oil. In the future, if needed, you can certainly try gabapentin 100 mg as needed up to 3 times daily.  We can also consider trying EMLA cream which is a topical Numbing agent. For now, I think it is okay to see you back on an as-needed basis. Call of email, if you need to make an appointment.

## 2018-12-11 NOTE — Progress Notes (Signed)
Subjective:    Patient ID: Erin Myers is a 78 y.o. female.  HPI     Interim history:   Erin Myers is a 78 year old right-handed woman with an underlying medical history of hypertension, hyperlipidemia, hypothyroidism, lung cancer, ovarian cancer, anxiety, palpitations, diabetes and mildly overweight state with prior history of obesity, who presents for follow-up consultation of her neuropathy.  I saw her on 09/26/2018 at the request of her primary care physician, at which time she reported an approximately 2 to 39-monthhistory of numbness and pain affecting both feet.  I suggested we proceed with blood work and EMG and nerve conduction testing.  She had CK, CRP, ANA, B6, B1, RPR, and ESR.  These were unremarkable.  In addition, she had more recently additional work-up for iron deficiency and B12 and folate.  Her ferritin was elevated in the 700s, otherwise B12 in the 300s.  She had an EMG and nerve conduction test on 11/08/2018 and I reviewed the results: IMPRESSION:    Abnormal study demonstrating: - Axonal sensorimotor polyneuropathy.     We called her with her test results.  Today, 12/11/2018: She reports feeling stable for the most part, her pain severity has been less.  She found some relief with CBD oil and has also been using local CBD ointment.  She has an appointment with her oncologist in November with a CT with contrast planned ahead of the appointment to help make a decision about resuming chemotherapy.  She has been off of chemotherapy since about March.  She has fallen twice she admits.  One time she was trying to turn in her right knee locked up and she fell onto her left hip.  She had soreness afterwards but no obvious injuries.  She had another fall after that where she got tangled up in a hose, it was dark, both falls were at night.  She has been very cautious since then.  She walks with a four-point walking cane.  She has to have oxygen 24-7 and sleeps with her CPAP and  oxygen at night, she sleeps fairly well once she falls asleep.  She admits that she does not hydrate very well with water, she drinks about 2 or 316 ounce bottles of water per day.  Previously:   09/26/2018: (She) reports numbness and pain in both feet for the past 2 or 3 months.  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  She follows with Dr. MEarlie Server  She has an appointment with him next month, she has a CT pending.  She reports that her toes started feeling numb some 2 or maybe 3 months ago.  It has affected her feet and the numbness is worse on the right than left, she also has a stinging sensation at the bottom of her feet like she is walking on gravel or a rock.  I have previously seen her for sleep apnea, she has been lost to follow-up for over 3 years.  I reviewed your office note from 09/19/2018, which you kindly included.  She has had numbness in her feet.  She has been followed by oncology and is on chemotherapy.  Her latest A1c from July 05, 2018 per your records was 5.2.  She has had weight loss.  She is supplementing with protein drinks. She was started on trial of gabapentin 100 mg 3 times daily as needed.  She reports that she has  not actually taken it yet, she is afraid of side effects, she read the insert.  The pain is intermittent, she has difficulty walking, she uses a walker at the house, she fell a few months ago because the right knee gave out.  She has since then received an injection into the right knee.  She also had a vascular study done which showed benign findings.  She feels that her feet are cold and discolored sometimes.  She uses oxygen 24-7.  She hooks up her oxygen to the CPAP machine at night.  She tries to hydrate well with water.  She does have dryness of her mouth because of using oxygen.  Her A1c has been good, never above 7.  She is not on any diabetes medication.   She has lost weight. She has had anemia, she has Required 2 infusions in the past 6 months, in April and June.  She has regular lab work through oncology mostly.    03/30/2015: Erin Myers is a 78 year old right-handed woman with an underlying medical history of palpitations, hypertension, dyslipidemia, hypothyroidism, carcinoid lung cancer, status post right lower lobe lobectomy, who presents for follow-up consultation of her obstructive sleep apnea, after recent sleep studies. The patient is unaccompanied today.    I first met her on 10/23/2014 at the request of her primary care physician, at which time she reported a prior diagnosis of OSA and prior CPAP therapy but she had stopped using CPAP. I invited her back for sleep study. She had a baseline sleep study, followed by a CPAP titration study. I went over her test results with her in detail today. Her baseline sleep study from 12/15/2014 showed a sleep efficiency of 77.8% with a sleep latency of 52 minutes and wake after sleep onset of 52.5 minutes with mild sleep fragmentation noted. She had an elevated arousal index. She had an increased percentage of stage II sleep, a mildly decreased percentage of slow-wave sleep and a normal percentage of REM sleep with a normal REM latency. She had no significant PLMS, EKG or EEG changes. Moderate to loud snoring was noted. Total AHI was 25.9 per hour, average oxygen saturation of only 86%, nadir was 71% during REM sleep. Time below 88% saturation was nearly 5 hours.  Based on her sleep test results I invited her back for a full night CPAP titration study. She had this on 01/18/2015. Sleep efficiency was 77.7% with a latency to sleep of 54 minutes and wake after sleep onset of 56 minutes with mild to moderate sleep fragmentation noted. She had a normal arousal index. She had an increased percentage of stage II sleep, absence of slow-wave sleep and a normal percentage of REM sleep with a normal REM latency. She had no  significant PLMS, EKG or EEG changes. She had an average oxygen saturation of 89%, nadir was 81%. Time below 88% saturation was 2 hours and 11 minutes. Based on her test results are prescribed CPAP therapy for home use and also ordered a overnight pulse oximetry test once she was established on home CPAP therapy.   I reviewed her home pulse oximetry test results from 03/01/2015 while on CPAP therapy: Average oxygen saturation was 89.2%, nadir was 73%, time below 88% saturation was 266.4 minutes. Based on her test results I prescribed supplemental oxygen with CPAP therapy.   I reviewed her CPAP compliance data from 02/25/2015 through 03/26/2015 which is a total of 30 days during which time she used her CPAP every night except  for 1, with percent used days greater than 4 hours at 90%, indicating excellent compliance with an average usage of 6 hours and 42 minutes, residual AHI 1.1 per hour, leak low with the 95th percentile at 4.6 L/m and a pressure of 11 cm with EPR of 2.   She reports doing well, using CPAP, but not the O2, due to cost and too cumbersome. Had lung cancer, had RLL removed for Carcinoid, no chemo, no radiation. She has allergies, post nasal drip, occasional SOB and occasionally wheezy. Stopped smoking over 25 years ago. Had not had a PFT in years. She is compliant with CPAP treatment. While she does not notice a telltale improvement of her sleep, she does wake up a little better rested.    10/23/2014: She was previously diagnosed with obstructive sleep apnea several years ago and placed on CPAP therapy. Prior sleep test results are not available for my review today. I reviewed your office note from 07/24/2014, which you kindly included. This mentions a prior diagnosis of severe obstructive sleep apnea and CPAP therapy at a pressure of 7 cm. She no longer is on treatment, as she had a difficulty time with it.  She reports no family history of OSA. She would be willing to try CPAP again if the  need arises. She drinks caffeine in the form of iced tea, 1-2 glasses per day, she does not drink sodas or coffee. She rarely drinks alcohol maybe once or twice per year. She quit smoking in 1995. She works with her husband who has an Multimedia programmer. She is a retired Catering manager. Bedtime is around 11 PM and rise time is around 7 AM. She does not set an alarm. She wakes up sometimes adequately rested and sometimes marginally rested. She denies any headaches. Over the course of time she has gained weight. She has no significant nocturia. She feels tired during the day and may follow sleep if sedentary. She watches TV in bed at night. She denies restless leg symptoms. Her husband noticed her snoring and years ago she had an episode of gasping for air. She is not known to twitch her legs and her sleep. Her Epworth sleepiness score is 9 out of 24 today, her fatigue score is 36 out of 63.  Her Past Medical History Is Significant For: Past Medical History:  Diagnosis Date  . Anxiety   . Diabetes mellitus without complication ([place])   . Dyslipidemia   . HTN (hypertension)   . Hyperlipemia   . Hypothyroid   . Hypothyroidism   . Lung cancer ([place]) 2000   Carcinoid, s/p RLL lobectomy  . Obesity   . Ovarian cancer ([place])   . Palpitations 2009 and 2006   Negative nuclear stress test   . Sleep apnea    on C-pap    Her Past Surgical History Is Significant For: Past Surgical History:  Procedure Laterality Date  . ABDOMINAL HYSTERECTOMY  2001  . GRADED EXERCISE TOLERANCE TEST  08/2015    Blood pressure demonstrated a hypertensive response to exercise.  There was less than 0.46m of horizontal ST segment depression in the inferolateral leads. No diagnostic criterial for ischemia. Moderately impaired exercise tolerance. The patient achieved 5.8 mets. This is a low risk study.  . IR FLUORO GUIDE PORT INSERTION RIGHT  08/17/2017  . IR THORACENTESIS ASP PLEURAL SPACE W/IMG GUIDE  07/04/2018  . IR [place]  VASC ACCESS RIGHT  08/17/2017  . LOBECTOMY  2000   RLL  .  NM MYOCAR PERF WALL MOTION  08/20/2007   EF 74%  EXERCISE CAPACITY 7 METS  . OOPHORECTOMY  2001  . TONSILLECTOMY    . VIDEO BRONCHOSCOPY Bilateral 07/20/2017   Procedure: VIDEO BRONCHOSCOPY WITH FLUORO;  Surgeon: Collene Gobble, MD;  Location: [facility];  Service: Cardiopulmonary;  Laterality: Bilateral;    Her Family History Is Significant For: Family History  Problem Relation Age of Onset  . Coronary artery disease Mother 55       died of an MI  . Heart attack Mother   . Suicidality Sister   . CVA Brother 92  . CVA Father     Her Social History Is Significant For: Social History   Socioeconomic History  . Marital status: Married    Spouse name: Not on file  . Number of children: 2  . Years of education: HS  . Highest education level: Not on file  Occupational History  . Not on file  Social Needs  . Financial resource strain: Not on file  . Food insecurity    Worry: Not on file    Inability: Not on file  . Transportation needs    Medical: Not on file    Non-medical: Not on file  Tobacco Use  . Smoking status: Former Smoker    Packs/day: 0.75    Years: 30.00    Pack years: 22.50    Types: Cigarettes    Quit date: 08/18/1991    Years since quitting: 27.3  . Smokeless tobacco: Never Used  Substance and Sexual Activity  . Alcohol use: No    Alcohol/week: 0.0 standard drinks  . Drug use: No  . Sexual activity: Yes    Partners: Male  Lifestyle  . Physical activity    Days per week: Not on file    Minutes per session: Not on file  . Stress: Not on file  Relationships  . Social Herbalist on phone: Not on file    Gets together: Not on file    Attends religious service: Not on file    Active member of club or organization: Not on file    Attends meetings of clubs or organizations: Not on file    Relationship status: Not on file  Other Topics Concern  . Not on file  Social History Narrative    1-2 glasses of tea a day    Married mother of 2 (daughter close to 43 and son 45-69 years old).  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    Her Allergies Are:  Allergies  Allergen Reactions  . Adhesive [Tape] Other (See Comments)    Irritates skin   . Codeine Other (See Comments)    Skin crawlinf feeling  . Lactose Intolerance (Gi) Diarrhea  . Monosodium Glutamate Diarrhea  . Sulfa Antibiotics Nausea And Vomiting  :   Her Current Medications Are:  Outpatient Encounter Medications as of 12/11/2018  Medication Sig  . acetaminophen (TYLENOL) 500 MG tablet Take 500 mg by mouth every 6 (six) hours as needed.  . benzonatate (TESSALON) 200 MG capsule Take 1 capsule (200 mg total) by mouth 3 (three) times daily as needed for cough.  . budesonide-formoterol (SYMBICORT) 160-4.5 MCG/ACT inhaler Inhale 2 puffs into the lungs 2 (two) times daily.  . cholecalciferol (VITAMIN D3) 25 MCG (1000 UT) tablet Take 1,000 Units by mouth daily.  . clorazepate (TRANXENE) 7.5 MG tablet Take 7.5  mg by mouth 2 (two) times daily as needed for anxiety.  . docusate sodium (COLACE) 100 MG capsule Take 100 mg by mouth 2 (two) times daily.  . folic acid (FOLVITE) 1 MG tablet TAKE 1 TABLET BY MOUTH DAILY.  [name] guaiFENesin-codeine (CHERATUSSIN AC) 100-10 MG/5ML syrup Take 5 mLs by mouth every 6 (six) hours as needed for cough.  [name] ipratropium-albuterol (DUONEB) 0.5-2.5 (3) MG/3ML SOLN INHALE THE CONTENTS OF 1 VIAL VIA NEBULIZER 3 TIMES A DAY.  [name] levothyroxine (SYNTHROID, LEVOTHROID) 175 MCG tablet Take 175 mcg by mouth daily before breakfast.  . lidocaine-prilocaine (EMLA) cream Apply 1 application topically as needed.  . metoprolol succinate (TOPROL-XL) 50 MG 24 hr tablet Take 50 mg by mouth 2 (two) times daily.   . Multiple Vitamins-Minerals (VISION FORMULA 2 PO) Take by mouth. OTC "Vision pills for Macular degeneration"  . prochlorperazine (COMPAZINE)  10 MG tablet Take 1 tablet (10 mg total) by mouth every 6 (six) hours as needed for nausea or vomiting.  . rosuvastatin (CRESTOR) 10 MG tablet Take 10 mg by mouth every evening.   . valACYclovir (VALTREX) 500 MG tablet Take 500 mg by mouth daily.  [name] 20 MG TABS tablet TAKE 1 TABLET BY MOUTH DAILY WITH SUPPER.   No facility-administered encounter medications on file as of 12/11/2018.   :  Review of Systems:  Out of a complete 14 point review of systems, all are reviewed and negative with the exception of these symptoms as listed below: Review of Systems  Neurological:       Pt presents today to discuss her PN. Pt reports that she is stable. She did not try gabapentin for fear of side effects.    Objective:  Neurological Exam  Physical Exam Physical Examination:   Vitals:   12/11/18 0937  BP: (!) 182/80  Pulse: 66    General Examination: The patient is a very pleasant 79 y.o. female in no acute distress. She appears Better today, less deconditioned, good spirits.  She is well-groomed.  Oxygen via oxygen portable tank at 4 L/min via nasal cannula.   HEENT: Normocephalic, atraumatic, pupils are equal, round and reactive to light. Extraocular tracking is good without limitation to gaze excursion or nystagmus noted. Normal smooth pursuit is noted. Corrective eyeglasses in place. Hearing is grossly intact. Face is symmetric with normal facial animation and normal facial sensation. Speech is clear with no dysarthria noted. There is no hypophonia. There is no lip, neck/head, jaw or voice tremor. Neck is supple with full range of passive and active motion. There are no carotid bruits on auscultation. Oropharynx exam reveals: mild mouth dryness, adequate dental hygiene. Tongue protrudes centrally and palate elevates symmetrically. No significant nasal mucosal bogginess or redness and no septal deviation.   Chest: Clear to auscultation without wheezing, rhonchi or crackles noted.  Heart:  S1+S2+0, regular and normal without murmurs, rubs or gallops noted.   Abdomen: Soft, non-tender and non-distended with normal bowel sounds appreciated on auscultation.  Extremities: There is no pitting edema in the distal lower extremities bilaterally.   Skin: Warm and dry without trophic changes noted.  Musculoskeletal: exam reveals no obvious joint deformities, tenderness or joint swelling or erythema.   Neurologically:  Mental status: The patient is awake, alert and oriented in all 4 spheres. Her immediate and remote memory, attention, language skills and fund of knowledge are appropriate. There is no evidence of aphasia, agnosia, apraxia or anomia. Speech is clear with normal prosody and enunciation. Thought  process is linear. Mood is normal and affect is normal.  Cranial nerves II - XII are as described above under HEENT exam. In addition: shoulder shrug is normal with equal shoulder height noted. Motor exam: Thin built, global strength of 4 out of 5, weaker in both feet. Muscle tone is normal, slightly reduced perhaps in the lower extremities.  Romberg is not tested d/t safety reasons. Fine motor skills are globally mildly impaired.  Cerebellar testing: No dysmetria or intention tremor.  Sensory exam: no obvious change. She reports discomfort in the balls of her feet. Gait, station and balance: She stands with mild difficulty, she requires no assistance.  She stands slightly wide-based and walks with Her 4-prong walking stick but does not need assistance otherwise. She is able to hold her on portable oxygen tank via shoulder carrier.   Assessment and plan:   In summary, Vernie Vinciguerra is a very pleasant 78 year old female with an underlying medical history of hypertension, hyperlipidemia, hypothyroidism, lung cancer, ovarian cancer, anxiety, palpitations, OSA on CPAP therapy, oxygen dependence, diabetes and prior history of obesity, who Presents for follow-up consultation of  her neuropathy.  EMG and nerve conduction testing revealed axonal sensorimotor neuropathy.  Treatable causes with significant blood work in the recent past has not revealed any obvious vitamin deficiency or hormonal cause.  Of note, she has been on chemotherapy, Currently on hiatus.  She has stage IV adenocarcinoma of the lung.  She will have a follow-up in November with her oncologist regarding resuming chemotherapy.  She has improved in the sense that she is able to walk better, she is no longer in the wheelchair as compared to our July appointment.  She also looks better.  She has found some relief with over-the-counter use of CBD oil and CBD ointment.  I suggested we could consider EMLA cream if needed.  Voltaren cream can be an option for local application but she is on a blood thinner and may not be a good candidate for even topical Voltaren.  Nevertheless, we can consider topical lidocaine in patch form or EMLA if needed.  For now, she is not in severe pain.  She did not want to try the gabapentin for fear of side effects.  She had a prescription for this from  before.  We mutually agreed to monitor symptoms.  She is advised to follow-up as needed.  She is advised to hydrate better with water.  I answered all her questions today and the patient and her husband were in agreement. I spent 25 minutes in total face-to-face time with the patient, more than 50% of which was spent in counseling and coordination of care, reviewing test results, reviewing medication and discussing or reviewing the diagnosis of PN, its prognosis and treatment options. Pertinent laboratory and imaging test results that were available during this visit with the patient were reviewed by me and considered in my medical decision making (see chart for details).

## 2018-12-12 NOTE — Telephone Encounter (Signed)
Pt is returning phone call to see if someone could call her about her POC

## 2018-12-12 NOTE — Telephone Encounter (Signed)
Called [facility] [phone], spoke with Bethanne Ginger she stated they contacted the patient and made her aware that her insurance was going to cover the POC, but had the concentrator as "under review" and not covering it at this time. Bethanne Ginger stated they already made the patient aware this was an insurance issue and does not involve our office and the patient will need to contact her insurance directly.  Talked with the patient and she stated she needed to get a response and get the [place] before she leaves for vacation on 12/17/18. She already contacted her insurance company who told her they were waiting on information from [place]. I advised I would call [employer] back because once we forward the request, they process accordingly. Also the request for the patient be placed on a home fill system was 11/10/17.  I told the patient I would call [employer] back to get clarification and let her know what we find out. Patient agreed.  Called [employer] and spoke with [place]. She confirmed there was no information that was needed from our office. They submitted the paperwork multiple times and they are waiting on the insurance to cover the cost of the equipment. They are not able to release anything until approval has been received. They faxed information to her insurance yesterday as the order expired 08/07/18, and they are waiting on a response. As of the time of this call (4:46 on 12/12/18) they have not received a response back. Estill Bamberg confirmed that even if they obtain insurance approval by tomorrow, they equipment the patient requested would not be available in time for her to take with her on Monday. I told Estill Bamberg that if there is anything we need to do on our end to call and let us know.  Called the patient and made her aware of the response received from [place] [facility]. Recommended she contact the insurance company directly and ask for the department that handles the requests from supply companies like [facility] and let them know that [employer]  has sent the request multiple times without a response. Patient agreed and voiced understanding. Nothing further needed at this time.

## 2018-12-27 DIAGNOSIS — H5203 Hypermetropia, bilateral: Secondary | ICD-10-CM | POA: Diagnosis not present

## 2018-12-27 DIAGNOSIS — H2513 Age-related nuclear cataract, bilateral: Secondary | ICD-10-CM | POA: Diagnosis not present

## 2018-12-27 DIAGNOSIS — H52222 Regular astigmatism, left eye: Secondary | ICD-10-CM | POA: Diagnosis not present

## 2018-12-27 DIAGNOSIS — H524 Presbyopia: Secondary | ICD-10-CM | POA: Diagnosis not present

## 2019-01-07 DIAGNOSIS — J449 Chronic obstructive pulmonary disease, unspecified: Secondary | ICD-10-CM | POA: Diagnosis not present

## 2019-01-23 DIAGNOSIS — G4733 Obstructive sleep apnea (adult) (pediatric): Secondary | ICD-10-CM | POA: Diagnosis not present

## 2019-01-25 ENCOUNTER — Other Ambulatory Visit: Payer: Self-pay

## 2019-01-25 ENCOUNTER — Inpatient Hospital Stay: Payer: PPO | Attending: Internal Medicine

## 2019-01-25 DIAGNOSIS — E119 Type 2 diabetes mellitus without complications: Secondary | ICD-10-CM | POA: Insufficient documentation

## 2019-01-25 DIAGNOSIS — E039 Hypothyroidism, unspecified: Secondary | ICD-10-CM | POA: Diagnosis not present

## 2019-01-25 DIAGNOSIS — R5383 Other fatigue: Secondary | ICD-10-CM | POA: Diagnosis not present

## 2019-01-25 DIAGNOSIS — I700 Atherosclerosis of aorta: Secondary | ICD-10-CM | POA: Insufficient documentation

## 2019-01-25 DIAGNOSIS — I10 Essential (primary) hypertension: Secondary | ICD-10-CM | POA: Diagnosis not present

## 2019-01-25 DIAGNOSIS — Z79899 Other long term (current) drug therapy: Secondary | ICD-10-CM | POA: Insufficient documentation

## 2019-01-25 DIAGNOSIS — Z7901 Long term (current) use of anticoagulants: Secondary | ICD-10-CM | POA: Insufficient documentation

## 2019-01-25 DIAGNOSIS — Z86711 Personal history of pulmonary embolism: Secondary | ICD-10-CM | POA: Diagnosis not present

## 2019-01-25 DIAGNOSIS — Z7951 Long term (current) use of inhaled steroids: Secondary | ICD-10-CM | POA: Diagnosis not present

## 2019-01-25 DIAGNOSIS — E785 Hyperlipidemia, unspecified: Secondary | ICD-10-CM | POA: Insufficient documentation

## 2019-01-25 DIAGNOSIS — F419 Anxiety disorder, unspecified: Secondary | ICD-10-CM | POA: Insufficient documentation

## 2019-01-25 DIAGNOSIS — E669 Obesity, unspecified: Secondary | ICD-10-CM | POA: Diagnosis not present

## 2019-01-25 DIAGNOSIS — C3490 Malignant neoplasm of unspecified part of unspecified bronchus or lung: Secondary | ICD-10-CM

## 2019-01-25 DIAGNOSIS — C3491 Malignant neoplasm of unspecified part of right bronchus or lung: Secondary | ICD-10-CM

## 2019-01-25 DIAGNOSIS — C3481 Malignant neoplasm of overlapping sites of right bronchus and lung: Secondary | ICD-10-CM | POA: Diagnosis not present

## 2019-01-25 LAB — CBC WITH DIFFERENTIAL (CANCER CENTER ONLY)
Abs Immature Granulocytes: 0.02 10*3/uL (ref 0.00–0.07)
Basophils Absolute: 0 10*3/uL (ref 0.0–0.1)
Basophils Relative: 0 %
Eosinophils Absolute: 0.1 10*3/uL (ref 0.0–0.5)
Eosinophils Relative: 2 %
HCT: 38 % (ref 36.0–46.0)
Hemoglobin: 11.9 g/dL — ABNORMAL LOW (ref 12.0–15.0)
Immature Granulocytes: 0 %
Lymphocytes Relative: 28 %
Lymphs Abs: 2.3 10*3/uL (ref 0.7–4.0)
MCH: 32.7 pg (ref 26.0–34.0)
MCHC: 31.3 g/dL (ref 30.0–36.0)
MCV: 104.4 fL — ABNORMAL HIGH (ref 80.0–100.0)
Monocytes Absolute: 0.6 10*3/uL (ref 0.1–1.0)
Monocytes Relative: 7 %
Neutro Abs: 5.1 10*3/uL (ref 1.7–7.7)
Neutrophils Relative %: 63 %
Platelet Count: 162 10*3/uL (ref 150–400)
RBC: 3.64 MIL/uL — ABNORMAL LOW (ref 3.87–5.11)
RDW: 12.8 % (ref 11.5–15.5)
WBC Count: 8.1 10*3/uL (ref 4.0–10.5)
nRBC: 0 % (ref 0.0–0.2)

## 2019-01-25 LAB — CMP (CANCER CENTER ONLY)
ALT: 8 U/L (ref 0–44)
AST: 12 U/L — ABNORMAL LOW (ref 15–41)
Albumin: 3.5 g/dL (ref 3.5–5.0)
Alkaline Phosphatase: 64 U/L (ref 38–126)
Anion gap: 9 (ref 5–15)
BUN: 32 mg/dL — ABNORMAL HIGH (ref 8–23)
CO2: 30 mmol/L (ref 22–32)
Calcium: 9 mg/dL (ref 8.9–10.3)
Chloride: 107 mmol/L (ref 98–111)
Creatinine: 1.39 mg/dL — ABNORMAL HIGH (ref 0.44–1.00)
GFR, Est AFR Am: 43 mL/min — ABNORMAL LOW (ref >60)
GFR, Estimated: 37 mL/min — ABNORMAL LOW (ref >60)
Glucose, Bld: 94 mg/dL (ref 70–99)
Potassium: 4.4 mmol/L (ref 3.5–5.1)
Sodium: 146 mmol/L — ABNORMAL HIGH (ref 135–145)
Total Bilirubin: 0.3 mg/dL (ref 0.3–1.2)
Total Protein: 6.8 g/dL (ref 6.5–8.1)

## 2019-01-25 LAB — TSH: TSH: <0.08 u[IU]/mL — ABNORMAL LOW (ref 0.308–3.960)

## 2019-01-28 ENCOUNTER — Other Ambulatory Visit: Payer: Self-pay

## 2019-01-28 ENCOUNTER — Ambulatory Visit (HOSPITAL_COMMUNITY)
Admission: RE | Admit: 2019-01-28 | Discharge: 2019-01-28 | Disposition: A | Discharge status: Home / Self Care | Payer: PPO | Source: Ambulatory Visit | Attending: Internal Medicine | Admitting: Internal Medicine

## 2019-01-28 ENCOUNTER — Encounter (HOSPITAL_COMMUNITY): Payer: Self-pay

## 2019-01-28 DIAGNOSIS — C3490 Malignant neoplasm of unspecified part of unspecified bronchus or lung: Secondary | ICD-10-CM | POA: Insufficient documentation

## 2019-01-29 ENCOUNTER — Telehealth: Payer: Self-pay | Admitting: Internal Medicine

## 2019-01-29 ENCOUNTER — Other Ambulatory Visit: Payer: Self-pay

## 2019-01-29 ENCOUNTER — Encounter: Payer: Self-pay | Admitting: Internal Medicine

## 2019-01-29 ENCOUNTER — Inpatient Hospital Stay (HOSPITAL_BASED_OUTPATIENT_CLINIC_OR_DEPARTMENT_OTHER): Payer: PPO | Admitting: Internal Medicine

## 2019-01-29 VITALS — BP 161/64 | HR 84 | Temp 98.3°F | Resp 16 | Ht 65.0 in | Wt 169.8 lb

## 2019-01-29 DIAGNOSIS — I2699 Other pulmonary embolism without acute cor pulmonale: Secondary | ICD-10-CM | POA: Diagnosis not present

## 2019-01-29 DIAGNOSIS — I10 Essential (primary) hypertension: Secondary | ICD-10-CM | POA: Diagnosis not present

## 2019-01-29 DIAGNOSIS — C3491 Malignant neoplasm of unspecified part of right bronchus or lung: Secondary | ICD-10-CM

## 2019-01-29 DIAGNOSIS — Z5112 Encounter for antineoplastic immunotherapy: Secondary | ICD-10-CM | POA: Diagnosis not present

## 2019-01-29 DIAGNOSIS — C3481 Malignant neoplasm of overlapping sites of right bronchus and lung: Secondary | ICD-10-CM | POA: Diagnosis not present

## 2019-01-29 NOTE — Progress Notes (Signed)
Erin [phone]   [phone]) [phone]  OFFICE PROGRESS NOTE  Erin Battles, MD [place] [address]  DIAGNOSIS:  1) Stage IV (T3, N0, M1 a)non-small cell lung cancer, well-differentiated adenocarcinoma presented with multifocal disease involving mainly the right upper lobe as well as the right middle lobe and left lower lobe of the lung diagnosed in May 2019. 2) bilateral small pulmonary emboli diagnosed incidentally on CT scan of the chest on October 16, 2017  Guardant 360 testing was negative for actionable mutations.  PRIOR THERAPY:None  CURRENT THERAPY: 1) Carboplatin for an AUC of 5, Alimta 500 mg/m2 and Keytruda 200 mg IV given every 3 weeks. First dosegiven on 08/18/2017.Status post 15 cycles.  Starting from cycle #5 the patient is on maintenance Alimta and Ketruda (pembrolizumab) every 3 weeks.  Her treatment has been on hold for several months. She will start treatment with single agent Keytruda on February 12, 2019. 2) Xarelto 15 mg p.o. twice daily for the first 3 weeks followed by 20 mg p.o. Daily.  INTERVAL HISTORY: Erin Myers 78 y.o. female returns to the clinic today for follow-up visit accompanied by her husband.  The patient is feeling fine today with no concerning complaints except for mild fatigue and baseline shortness of breath and she is currently on home oxygen.  She denied having any chest pain, cough or hemoptysis.  She denied having any nausea, vomiting, diarrhea or constipation.  She has no headache or visual changes.  She denied having any recent weight loss or night sweats.  The patient has been on observation for several months now with no treatment.  She had repeat CT scan of the chest, abdomen pelvis performed recently and she is here for evaluation and discussion of her scan results.   MEDICAL HISTORY: Past Medical History:  Diagnosis Date   Anxiety    Diabetes mellitus without  complication ([name])    Dyslipidemia    HTN (hypertension)    Hyperlipemia    Hypothyroid    Hypothyroidism    Lung cancer (Erin Myers) 2000   Carcinoid, s/p RLL lobectomy   Obesity    Ovarian cancer (Erin Myers)    Palpitations 2009 and 2006   Negative nuclear stress test    Sleep apnea    on C-pap    ALLERGIES:  is allergic to adhesive [tape]; codeine; lactose intolerance (gi); monosodium glutamate; and sulfa antibiotics.  MEDICATIONS:  Current Outpatient Medications  Medication Sig Dispense Refill   acetaminophen (TYLENOL) 500 MG tablet Take 500 mg by mouth every 6 (six) hours as needed.     benzonatate (TESSALON) 200 MG capsule Take 1 capsule (200 mg total) by mouth 3 (three) times daily as needed for cough. 30 capsule 1   budesonide-formoterol (SYMBICORT) 160-4.5 MCG/ACT inhaler Inhale 2 puffs into the lungs 2 (two) times daily. 10.2 g 11   cholecalciferol (VITAMIN D3) 25 MCG (1000 UT) tablet Take 1,000 Units by mouth daily.     clorazepate (TRANXENE) 7.5 MG tablet Take 7.5 mg by mouth 2 (two) times daily as needed for anxiety.     docusate sodium (COLACE) 100 MG capsule Take 100 mg by mouth 2 (two) times daily.     folic acid (FOLVITE) 1 MG tablet TAKE 1 TABLET BY MOUTH DAILY. 30 tablet 2   guaiFENesin-codeine (CHERATUSSIN AC) 100-10 MG/5ML syrup Take 5 mLs by mouth every 6 (six) hours as needed for cough. 240 mL 0   ipratropium-albuterol (DUONEB)  0.5-2.5 (3) MG/3ML SOLN INHALE THE CONTENTS OF 1 VIAL VIA NEBULIZER 3 TIMES A DAY. 120 mL 11   levothyroxine (SYNTHROID, LEVOTHROID) 175 MCG tablet Take 175 mcg by mouth daily before breakfast.     lidocaine-prilocaine (EMLA) cream Apply 1 application topically as needed. 30 g 0   metoprolol succinate (TOPROL-XL) 50 MG 24 hr tablet Take 50 mg by mouth 2 (two) times daily.      Multiple Vitamins-Minerals (VISION FORMULA 2 PO) Take by mouth. OTC "Vision pills for Macular degeneration"     prochlorperazine (COMPAZINE) 10 MG  tablet Take 1 tablet (10 mg total) by mouth every 6 (six) hours as needed for nausea or vomiting. 30 tablet 0   rosuvastatin (CRESTOR) 10 MG tablet Take 10 mg by mouth every evening.      valACYclovir (VALTREX) 500 MG tablet Take 500 mg by mouth daily.     XARELTO 20 MG TABS tablet TAKE 1 TABLET BY MOUTH DAILY WITH SUPPER. 30 tablet 1   No current facility-administered medications for this visit.     SURGICAL HISTORY:  Past Surgical History:  Procedure Laterality Date   ABDOMINAL HYSTERECTOMY  2001   GRADED EXERCISE TOLERANCE TEST  08/2015    Blood pressure demonstrated a hypertensive response to exercise.  There was less than 0.57mm of horizontal ST segment depression in the inferolateral leads. No diagnostic criterial for ischemia. Moderately impaired exercise tolerance. The patient achieved 5.8 mets. This is a low risk study.   IR FLUORO GUIDE PORT INSERTION RIGHT  08/17/2017   IR THORACENTESIS ASP PLEURAL SPACE W/IMG GUIDE  07/04/2018   IR US GUIDE VASC ACCESS RIGHT  08/17/2017   LOBECTOMY  2000   RLL   NM MYOCAR PERF WALL MOTION  08/20/2007   EF 74%  EXERCISE CAPACITY 7 METS   OOPHORECTOMY  2001   TONSILLECTOMY     VIDEO BRONCHOSCOPY Bilateral 07/20/2017   Procedure: VIDEO BRONCHOSCOPY WITH FLUORO;  Surgeon: Collene Gobble, MD;  Location: [facility];  Service: Cardiopulmonary;  Laterality: Bilateral;    REVIEW OF SYSTEMS:  Constitutional: positive for fatigue Eyes: negative Ears, nose, mouth, throat, and face: negative Respiratory: positive for dyspnea on exertion Cardiovascular: negative Gastrointestinal: negative Genitourinary:negative Integument/breast: negative Hematologic/lymphatic: negative Musculoskeletal:negative Neurological: negative Behavioral/Psych: negative Endocrine: negative Allergic/Immunologic: negative   PHYSICAL EXAMINATION: General appearance: alert, cooperative, fatigued and no distress Head: Normocephalic, without obvious abnormality,  atraumatic Neck: no adenopathy, no JVD, supple, symmetrical, trachea midline and thyroid not enlarged, symmetric, no tenderness/mass/nodules Lymph nodes: Cervical, supraclavicular, and axillary nodes normal. Resp: clear to auscultation bilaterally Back: symmetric, no curvature. ROM normal. No CVA tenderness. Cardio: regular rate and rhythm, S1, S2 normal, no murmur, click, rub or gallop GI: soft, non-tender; bowel sounds normal; no masses,  no organomegaly Extremities: extremities normal, atraumatic, no cyanosis or edema Neurologic: Alert and oriented X 3, normal strength and tone. Normal symmetric reflexes. Normal coordination and gait  ECOG PERFORMANCE STATUS: 1 - Symptomatic but completely ambulatory  Blood pressure (!) 161/64, pulse 84, temperature 98.3 F (36.8 C), temperature source Temporal, resp. rate 16, height 5\' 5"  (1.651 m), weight 169 lb 12.8 oz (77 kg), SpO2 91 %.  LABORATORY DATA: Lab Results  Component Value Date   WBC 8.1 01/25/2019   HGB 11.9 (L) 01/25/2019   HCT 38.0 01/25/2019   MCV 104.4 (H) 01/25/2019   PLT 162 01/25/2019      Chemistry      Component Value Date/Time   NA 146 (H) 01/25/2019  0931   K 4.4 01/25/2019 0931   CL 107 01/25/2019 0931   CO2 30 01/25/2019 0931   BUN 32 (H) 01/25/2019 0931   CREATININE 1.39 (H) 01/25/2019 0931      Component Value Date/Time   CALCIUM 9.0 01/25/2019 0931   ALKPHOS 64 01/25/2019 0931   AST 12 (L) 01/25/2019 0931   ALT 8 01/25/2019 0931   BILITOT 0.3 01/25/2019 0931       RADIOGRAPHIC STUDIES: Ct Abdomen Pelvis Wo Contrast  Result Date: 01/28/2019 CLINICAL DATA:  Restaging non-small cell lung cancer. History of chemotherapy and immunotherapy. EXAM: CT CHEST, ABDOMEN AND PELVIS WITHOUT CONTRAST TECHNIQUE: Multidetector CT imaging of the chest, abdomen and pelvis was performed following the standard protocol without IV contrast. COMPARISON:  10/26/2018, 08/23/2018 and 07/04/2018 FINDINGS: CT CHEST FINDINGS  Cardiovascular: The heart is normal in size. No pericardial effusion. Stable aortic calcifications but no aneurysm. Scattered coronary artery calcifications. Stable mild pulmonary artery enlargement suggesting pulmonary hypertension. The right IJ Port-A-Cath is stable. Mediastinum/Nodes: Small scattered mediastinal and hilar lymph nodes are stable. Right-sided subcarinal lymph node measures 8.5 mm and is unchanged. The esophagus is grossly normal. Lungs/Pleura: Stable surgical changes in the right middle lobe. Stable extensive scarring type changes in the right upper lobe posteriorly. No discrete mass or nodule is identified. New 5 mm nodule in the superior segment right lower lobe on image 62/6. Slightly more inferiorly there is a 10 mm nodule on image 73/6. This is stable in size. 7 mm superior segment left lower lobe nodule on image number 73/6 previously measured 3.5 mm. 10 mm nodule at the left lung base not for certain seen on the prior study. Progressive right basilar airspace consolidation without discrete mass. There are numerous clustered nodules in the left lower lobe along with areas of peribronchial thickening and slight increased interstitial markings. This has more the appearance of an inflammatory or atypical infectious process than metastatic disease. It is slightly progressive. Musculoskeletal: No significant bony findings. CT ABDOMEN PELVIS FINDINGS Hepatobiliary: No focal hepatic lesions or intrahepatic biliary dilatation. Stable moderate impression on the right hepatic lobe by the diaphragm. Suspect small layering gallstones in the gallbladder. Pancreas: No mass, inflammation or ductal dilatation. Spleen: Normal size.  No focal lesions. Adrenals/Urinary Tract: Adrenal glands and kidneys are unremarkable. No worrisome renal lesions or renal calculi. No bladder lesions. Stomach/Bowel: The stomach, duodenum, small bowel and colon are unremarkable. No acute inflammatory changes, mass lesions or  obstructive findings. Vascular/Lymphatic: Moderate atherosclerotic calcifications involving the aorta and iliac arteries but no aneurysm. No mesenteric or retroperitoneal mass or adenopathy. Reproductive: Surgically absent. Other: No pelvic mass or adenopathy. No free pelvic fluid collections. No inguinal mass or adenopathy. No abdominal wall hernia or subcutaneous lesions. Musculoskeletal: No significant bony findings. IMPRESSION: 1. New right upper lobe and left basilar nodules went compared to the most recent prior examination. There is also a stable 10 mm nodule in the superior segment right lower lobe and a slightly larger superior segment left lower lobe nodule. Findings are somewhat concerning for metastatic disease. Continued close follow-up versus is PET-CT versus bronchoscopy. 2. Clustered nodules in the left lower lobe have more the appearance of an inflammatory or infectious process and this appears slightly progressive. 3. Stable mediastinal and hilar lymph nodes. 4. Stable underlying emphysematous changes and pulmonary scarring. 5. No findings for metastatic disease involving the abdomen/pelvis or bony structures. Electronically Signed   By: Marijo Sanes M.D.   On: 01/28/2019 10:54  Ct Chest Wo Contrast  Result Date: 01/28/2019 CLINICAL DATA:  Restaging non-small cell lung cancer. History of chemotherapy and immunotherapy. EXAM: CT CHEST, ABDOMEN AND PELVIS WITHOUT CONTRAST TECHNIQUE: Multidetector CT imaging of the chest, abdomen and pelvis was performed following the standard protocol without IV contrast. COMPARISON:  10/26/2018, 08/23/2018 and 07/04/2018 FINDINGS: CT CHEST FINDINGS Cardiovascular: The heart is normal in size. No pericardial effusion. Stable aortic calcifications but no aneurysm. Scattered coronary artery calcifications. Stable mild pulmonary artery enlargement suggesting pulmonary hypertension. The right IJ Port-A-Cath is stable. Mediastinum/Nodes: Small scattered mediastinal  and hilar lymph nodes are stable. Right-sided subcarinal lymph node measures 8.5 mm and is unchanged. The esophagus is grossly normal. Lungs/Pleura: Stable surgical changes in the right middle lobe. Stable extensive scarring type changes in the right upper lobe posteriorly. No discrete mass or nodule is identified. New 5 mm nodule in the superior segment right lower lobe on image 62/6. Slightly more inferiorly there is a 10 mm nodule on image 73/6. This is stable in size. 7 mm superior segment left lower lobe nodule on image number 73/6 previously measured 3.5 mm. 10 mm nodule at the left lung base not for certain seen on the prior study. Progressive right basilar airspace consolidation without discrete mass. There are numerous clustered nodules in the left lower lobe along with areas of peribronchial thickening and slight increased interstitial markings. This has more the appearance of an inflammatory or atypical infectious process than metastatic disease. It is slightly progressive. Musculoskeletal: No significant bony findings. CT ABDOMEN PELVIS FINDINGS Hepatobiliary: No focal hepatic lesions or intrahepatic biliary dilatation. Stable moderate impression on the right hepatic lobe by the diaphragm. Suspect small layering gallstones in the gallbladder. Pancreas: No mass, inflammation or ductal dilatation. Spleen: Normal size.  No focal lesions. Adrenals/Urinary Tract: Adrenal glands and kidneys are unremarkable. No worrisome renal lesions or renal calculi. No bladder lesions. Stomach/Bowel: The stomach, duodenum, small bowel and colon are unremarkable. No acute inflammatory changes, mass lesions or obstructive findings. Vascular/Lymphatic: Moderate atherosclerotic calcifications involving the aorta and iliac arteries but no aneurysm. No mesenteric or retroperitoneal mass or adenopathy. Reproductive: Surgically absent. Other: No pelvic mass or adenopathy. No free pelvic fluid collections. No inguinal mass or  adenopathy. No abdominal wall hernia or subcutaneous lesions. Musculoskeletal: No significant bony findings. IMPRESSION: 1. New right upper lobe and left basilar nodules went compared to the most recent prior examination. There is also a stable 10 mm nodule in the superior segment right lower lobe and a slightly larger superior segment left lower lobe nodule. Findings are somewhat concerning for metastatic disease. Continued close follow-up versus is PET-CT versus bronchoscopy. 2. Clustered nodules in the left lower lobe have more the appearance of an inflammatory or infectious process and this appears slightly progressive. 3. Stable mediastinal and hilar lymph nodes. 4. Stable underlying emphysematous changes and pulmonary scarring. 5. No findings for metastatic disease involving the abdomen/pelvis or bony structures. Electronically Signed   By: Marijo Sanes M.D.   On: 01/28/2019 10:54     ASSESSMENT AND PLAN: This is a very pleasant 78 years old white female with stage IV non-small cell lung cancer, adenocarcinoma with no actionable mutations. She is currently undergoing systemic chemotherapy with carboplatin, Alimta and Ketruda (pembrolizumab) status post 15 cycles.  Starting from cycle #5 she is on maintenance treatment with Alimta and Ketruda (pembrolizumab). She has been in observation for several months with no treatment. The patient had repeat CT scan of the chest, abdomen pelvis performed  recently.  I personally and independently reviewed the scans and discussed the results with the patient and her husband today. Her scan showed some evidence for disease progression with new pulmonary nodules. I had a lengthy discussion with the patient and her husband about her condition and treatment options.  The patient was given the option of continuous observation and close monitoring of these new nodules versus resuming her treatment with single agent Keytruda since the patient has a rough time with the  chemotherapy portion of this treatment. After lengthy discussion the patient would like to proceed with treatment with [facility].  I reminded her of the adverse effect of this treatment and she is in agreement to proceed. She is expected to start the first cycle of this treatment on February 12, 2019. The patient will come back for follow-up visit in 5 weeks for evaluation with the start of cycle #2. For hypertension she was strongly advised to take her blood pressure medication as prescribed and to monitor it closely at home. For the history of pulmonary embolism, she will continue her current treatment with Xarelto for now. The patient was advised to call immediately if she has any concerning symptoms in the interval.  The patient voices understanding of current disease status and treatment options and is in agreement with the current care plan. All questions were answered. The patient knows to call the clinic with any problems, questions or concerns. We can certainly see the patient much sooner if necessary.  Disclaimer: This note was dictated with voice recognition software. Similar sounding words can inadvertently be transcribed and may not be corrected upon review.

## 2019-01-29 NOTE — Telephone Encounter (Signed)
Scheduled appt per 11/17 los. ° °Printed calendar and avs. °

## 2019-02-07 DIAGNOSIS — J449 Chronic obstructive pulmonary disease, unspecified: Secondary | ICD-10-CM | POA: Diagnosis not present

## 2019-02-08 ENCOUNTER — Encounter: Payer: Self-pay | Admitting: Internal Medicine

## 2019-02-12 ENCOUNTER — Inpatient Hospital Stay (HOSPITAL_BASED_OUTPATIENT_CLINIC_OR_DEPARTMENT_OTHER): Payer: PPO | Admitting: Medical

## 2019-02-12 ENCOUNTER — Inpatient Hospital Stay: Payer: PPO

## 2019-02-12 ENCOUNTER — Other Ambulatory Visit: Payer: Self-pay | Admitting: Pulmonary Disease

## 2019-02-12 ENCOUNTER — Other Ambulatory Visit: Payer: Self-pay | Admitting: Medical

## 2019-02-12 ENCOUNTER — Inpatient Hospital Stay: Payer: PPO | Attending: Internal Medicine

## 2019-02-12 ENCOUNTER — Other Ambulatory Visit: Payer: Self-pay

## 2019-02-12 VITALS — BP 159/50 | HR 68 | Temp 98.4°F | Resp 18

## 2019-02-12 DIAGNOSIS — E039 Hypothyroidism, unspecified: Secondary | ICD-10-CM | POA: Diagnosis not present

## 2019-02-12 DIAGNOSIS — G4730 Sleep apnea, unspecified: Secondary | ICD-10-CM | POA: Diagnosis not present

## 2019-02-12 DIAGNOSIS — C3491 Malignant neoplasm of unspecified part of right bronchus or lung: Secondary | ICD-10-CM

## 2019-02-12 DIAGNOSIS — Z86711 Personal history of pulmonary embolism: Secondary | ICD-10-CM | POA: Insufficient documentation

## 2019-02-12 DIAGNOSIS — I10 Essential (primary) hypertension: Secondary | ICD-10-CM | POA: Insufficient documentation

## 2019-02-12 DIAGNOSIS — C3481 Malignant neoplasm of overlapping sites of right bronchus and lung: Secondary | ICD-10-CM | POA: Diagnosis not present

## 2019-02-12 DIAGNOSIS — E119 Type 2 diabetes mellitus without complications: Secondary | ICD-10-CM | POA: Insufficient documentation

## 2019-02-12 DIAGNOSIS — T8090XA Unspecified complication following infusion and therapeutic injection, initial encounter: Secondary | ICD-10-CM

## 2019-02-12 DIAGNOSIS — E669 Obesity, unspecified: Secondary | ICD-10-CM | POA: Diagnosis not present

## 2019-02-12 DIAGNOSIS — E785 Hyperlipidemia, unspecified: Secondary | ICD-10-CM | POA: Insufficient documentation

## 2019-02-12 DIAGNOSIS — Z7901 Long term (current) use of anticoagulants: Secondary | ICD-10-CM | POA: Insufficient documentation

## 2019-02-12 DIAGNOSIS — Z79899 Other long term (current) drug therapy: Secondary | ICD-10-CM | POA: Diagnosis not present

## 2019-02-12 DIAGNOSIS — Z5112 Encounter for antineoplastic immunotherapy: Secondary | ICD-10-CM | POA: Insufficient documentation

## 2019-02-12 DIAGNOSIS — Z95828 Presence of other vascular implants and grafts: Secondary | ICD-10-CM

## 2019-02-12 LAB — CMP (CANCER CENTER ONLY)
ALT: 8 U/L (ref 0–44)
AST: 10 U/L — ABNORMAL LOW (ref 15–41)
Albumin: 3.3 g/dL — ABNORMAL LOW (ref 3.5–5.0)
Alkaline Phosphatase: 63 U/L (ref 38–126)
Anion gap: 10 (ref 5–15)
BUN: 23 mg/dL (ref 8–23)
CO2: 27 mmol/L (ref 22–32)
Calcium: 8.9 mg/dL (ref 8.9–10.3)
Chloride: 108 mmol/L (ref 98–111)
Creatinine: 1.29 mg/dL — ABNORMAL HIGH (ref 0.44–1.00)
GFR, Est AFR Am: 47 mL/min — ABNORMAL LOW (ref >60)
GFR, Estimated: 40 mL/min — ABNORMAL LOW (ref >60)
Glucose, Bld: 129 mg/dL — ABNORMAL HIGH (ref 70–99)
Potassium: 4.2 mmol/L (ref 3.5–5.1)
Sodium: 145 mmol/L (ref 135–145)
Total Bilirubin: 0.3 mg/dL (ref 0.3–1.2)
Total Protein: 6.6 g/dL (ref 6.5–8.1)

## 2019-02-12 LAB — CBC WITH DIFFERENTIAL (CANCER CENTER ONLY)
Abs Immature Granulocytes: 0.01 10*3/uL (ref 0.00–0.07)
Basophils Absolute: 0 10*3/uL (ref 0.0–0.1)
Basophils Relative: 1 %
Eosinophils Absolute: 0.2 10*3/uL (ref 0.0–0.5)
Eosinophils Relative: 2 %
HCT: 38.6 % (ref 36.0–46.0)
Hemoglobin: 11.9 g/dL — ABNORMAL LOW (ref 12.0–15.0)
Immature Granulocytes: 0 %
Lymphocytes Relative: 22 %
Lymphs Abs: 1.8 10*3/uL (ref 0.7–4.0)
MCH: 31.8 pg (ref 26.0–34.0)
MCHC: 30.8 g/dL (ref 30.0–36.0)
MCV: 103.2 fL — ABNORMAL HIGH (ref 80.0–100.0)
Monocytes Absolute: 0.7 10*3/uL (ref 0.1–1.0)
Monocytes Relative: 8 %
Neutro Abs: 5.6 10*3/uL (ref 1.7–7.7)
Neutrophils Relative %: 67 %
Platelet Count: 172 10*3/uL (ref 150–400)
RBC: 3.74 MIL/uL — ABNORMAL LOW (ref 3.87–5.11)
RDW: 12.9 % (ref 11.5–15.5)
WBC Count: 8.2 10*3/uL (ref 4.0–10.5)
nRBC: 0 % (ref 0.0–0.2)

## 2019-02-12 LAB — TSH: TSH: <0.08 u[IU]/mL — ABNORMAL LOW (ref 0.308–3.960)

## 2019-02-12 MED ORDER — SODIUM CHLORIDE 0.9% FLUSH
10.0000 mL | INTRAVENOUS | Status: DC | PRN
  Administered 2019-02-12: 10 mL
  Filled 2019-02-12: qty 10

## 2019-02-12 MED ORDER — HEPARIN SOD (PORK) LOCK FLUSH 100 UNIT/ML IV SOLN
500.0000 [IU] | Freq: Once | INTRAVENOUS | Status: AC | PRN
  Administered 2019-02-12: 500 [IU]
  Filled 2019-02-12: qty 5

## 2019-02-12 MED ORDER — SODIUM CHLORIDE 0.9 % IV SOLN
200.0000 mg | Freq: Once | INTRAVENOUS | Status: AC
  Administered 2019-02-12: 200 mg via INTRAVENOUS
  Filled 2019-02-12: qty 8

## 2019-02-12 MED ORDER — SODIUM CHLORIDE 0.9 % IV SOLN
Freq: Once | INTRAVENOUS | Status: AC
  Administered 2019-02-12: 13:00:00 via INTRAVENOUS
  Filled 2019-02-12: qty 250

## 2019-02-12 MED ORDER — FAMOTIDINE IN NACL 20-0.9 MG/50ML-% IV SOLN
INTRAVENOUS | Status: AC
  Filled 2019-02-12: qty 50

## 2019-02-12 MED ORDER — FAMOTIDINE IN NACL 20-0.9 MG/50ML-% IV SOLN
20.0000 mg | Freq: Once | INTRAVENOUS | Status: AC
  Administered 2019-02-12: 20 mg via INTRAVENOUS

## 2019-02-12 NOTE — Progress Notes (Signed)
Half way through [place] treatment today, Pt. C/O of severe joint pain in her hip area and discomfort in Left arm. NO chest pain or SOB reported. Sandi Mealy consulted. New order for Pepcid IV, wait 3min., continue Keytruda to completion.

## 2019-02-12 NOTE — Patient Instructions (Addendum)
[facility] Discharge Instructions for Patients Receiving Chemotherapy  Today you received the following chemotherapy agents Keytruda  To help prevent nausea and vomiting after your treatment, we encourage you to take your nausea medication.   If you develop nausea and vomiting that is not controlled by your nausea medication, call the clinic.   BELOW ARE SYMPTOMS THAT SHOULD BE REPORTED IMMEDIATELY:  *FEVER GREATER THAN 100.5 F  *CHILLS WITH OR WITHOUT FEVER  NAUSEA AND VOMITING THAT IS NOT CONTROLLED WITH YOUR NAUSEA MEDICATION  *UNUSUAL SHORTNESS OF BREATH  *UNUSUAL BRUISING OR BLEEDING  TENDERNESS IN MOUTH AND THROAT WITH OR WITHOUT PRESENCE OF ULCERS  *URINARY PROBLEMS  *BOWEL PROBLEMS  UNUSUAL RASH Items with * indicate a potential emergency and should be followed up as soon as possible.  Feel free to call the clinic should you have any questions or concerns. The clinic phone number is [phone].  Please show the [facility] at check-in to the Emergency Department and triage nurse.  Pembrolizumab injection What is this medicine? PEMBROLIZUMAB (pem broe liz ue mab) is a monoclonal antibody. It is used to treat bladder cancer, cervical cancer, endometrial cancer, esophageal cancer, head and neck cancer, hepatocellular cancer, Hodgkin lymphoma, kidney cancer, lymphoma, melanoma, Merkel cell carcinoma, lung cancer, stomach cancer, urothelial cancer, and cancers that have a certain genetic condition. This medicine may be used for other purposes; ask your health care provider or pharmacist if you have questions. COMMON BRAND NAME(S): Keytruda What should I tell my health care provider before I take this medicine? They need to know if you have any of these conditions:  diabetes  immune system problems  inflammatory bowel disease  liver disease  lung or breathing disease  lupus  received or scheduled to receive an organ transplant or a  stem-cell transplant that uses donor stem cells  an unusual or allergic reaction to pembrolizumab, other medicines, foods, dyes, or preservatives  pregnant or trying to get pregnant  breast-feeding How should I use this medicine? This medicine is for infusion into a vein. It is given by a health care professional in a hospital or clinic setting. A special MedGuide will be given to you before each treatment. Be sure to read this information carefully each time. Talk to your pediatrician regarding the use of this medicine in children. While this drug may be prescribed for selected conditions, precautions do apply. Overdosage: If you think you have taken too much of this medicine contact a poison control center or emergency room at once. NOTE: This medicine is only for you. Do not share this medicine with others. What if I miss a dose? It is important not to miss your dose. Call your doctor or health care professional if you are unable to keep an appointment. What may interact with this medicine? Interactions have not been studied. Give your health care provider a list of all the medicines, herbs, non-prescription drugs, or dietary supplements you use. Also tell them if you smoke, drink alcohol, or use illegal drugs. Some items may interact with your medicine. This list may not describe all possible interactions. Give your health care provider a list of all the medicines, herbs, non-prescription drugs, or dietary supplements you use. Also tell them if you smoke, drink alcohol, or use illegal drugs. Some items may interact with your medicine. What should I watch for while using this medicine? Your condition will be monitored carefully while you are receiving this medicine. You may need blood work  done while you are taking this medicine. Do not become pregnant while taking this medicine or for 4 months after stopping it. Women should inform their doctor if they wish to become pregnant or think they  might be pregnant. There is a potential for serious side effects to an unborn child. Talk to your health care professional or pharmacist for more information. Do not breast-feed an infant while taking this medicine or for 4 months after the last dose. What side effects may I notice from receiving this medicine? Side effects that you should report to your doctor or health care professional as soon as possible:  allergic reactions like skin rash, itching or hives, swelling of the face, lips, or tongue  bloody or black, tarry  breathing problems  changes in vision  chest pain  chills  confusion  constipation  cough  diarrhea  dizziness or feeling faint or lightheaded  fast or irregular heartbeat  fever  flushing  hair loss  joint pain  low blood counts - this medicine may decrease the number of white blood cells, red blood cells and platelets. You may be at increased risk for infections and bleeding.  muscle pain  muscle weakness  persistent headache  redness, blistering, peeling or loosening of the skin, including inside the mouth  signs and symptoms of high blood sugar such as dizziness; dry mouth; dry skin; fruity breath; nausea; stomach pain; increased hunger or thirst; increased urination  signs and symptoms of kidney injury like trouble passing urine or change in the amount of urine  signs and symptoms of liver injury like dark urine, light-colored stools, loss of appetite, nausea, right upper belly pain, yellowing of the eyes or skin  sweating  swollen lymph nodes  weight loss Side effects that usually do not require medical attention (report to your doctor or health care professional if they continue or are bothersome):  decreased appetite  muscle pain  tiredness This list may not describe all possible side effects. Call your doctor for medical advice about side effects. You may report side effects to FDA at 1-800-FDA-1088. Where should I keep my  medicine? This drug is given in a hospital or clinic and will not be stored at home. NOTE: This sheet is a summary. It may not cover all possible information. If you have questions about this medicine, talk to your doctor, pharmacist, or health care provider.  2020 Elsevier/Gold Standard (2018-03-27 13:46:58)

## 2019-02-13 NOTE — Progress Notes (Signed)
    DATE:  02/12/2019                                          X  CHEMO/IMMUNOTHERAPY REACTION            MD:  Dr. Fanny Bien. Mohamed   AGENT/BLOOD PRODUCT RECEIVING TODAY:                  Keytruda      AGENT/BLOOD PRODUCT RECEIVING IMMEDIATELY PRIOR TO REACTION:       Keytruda   VS: BP:      159/50   P:        68       SPO2:        98% on room air                  REACTION(S):           Bilateral hip pain, right arm pain, mild diaphoresis and flushing   PREMEDS:      None   INTERVENTION: Beryle Flock was paused and the patient was given Pepcid 20 mg IV x1.  After waiting 10 minutes Keytruda was restarted and completed.   Review of Systems  Review of Systems  Constitutional: Positive for diaphoresis. Negative for chills and fever.  HENT: Negative for trouble swallowing and voice change.   Respiratory: Negative for cough, chest tightness, shortness of breath and wheezing.   Cardiovascular: Negative for chest pain and palpitations.  Gastrointestinal: Negative for abdominal pain, constipation, diarrhea, nausea and vomiting.  Musculoskeletal: Positive for arthralgias. Negative for back pain and myalgias.  Neurological: Negative for dizziness, light-headedness and headaches.     Physical Exam  Physical Exam Constitutional:      General: She is not in acute distress.    Appearance: She is not diaphoretic.  HENT:     Head: Normocephalic and atraumatic.  Eyes:     General: No scleral icterus.       Right eye: No discharge.        Left eye: No discharge.     Conjunctiva/sclera: Conjunctivae normal.  Cardiovascular:     Rate and Rhythm: Normal rate and regular rhythm.     Heart sounds: Normal heart sounds. No murmur. No friction rub. No gallop.   Pulmonary:     Effort: Pulmonary effort is normal. No respiratory distress.     Breath sounds: Normal breath sounds. No wheezing or rales.  Skin:    General: Skin is warm and dry.     Findings: No erythema or rash.  Neurological:   Mental Status: She is alert.  Psychiatric:        Mood and Affect: Mood normal.        Behavior: Behavior normal.        Thought Content: Thought content normal.        Judgment: Judgment normal.     OUTCOME:               As noted above, Keytruda was restarted and completed without any further issues of concern.   Sandi Mealy, MHS, PA-C

## 2019-02-25 DIAGNOSIS — D485 Neoplasm of uncertain behavior of skin: Secondary | ICD-10-CM | POA: Diagnosis not present

## 2019-02-26 DIAGNOSIS — I788 Other diseases of capillaries: Secondary | ICD-10-CM | POA: Diagnosis not present

## 2019-02-26 DIAGNOSIS — L814 Other melanin hyperpigmentation: Secondary | ICD-10-CM | POA: Diagnosis not present

## 2019-02-26 DIAGNOSIS — D1801 Hemangioma of skin and subcutaneous tissue: Secondary | ICD-10-CM | POA: Diagnosis not present

## 2019-03-05 ENCOUNTER — Inpatient Hospital Stay (HOSPITAL_BASED_OUTPATIENT_CLINIC_OR_DEPARTMENT_OTHER): Payer: PPO | Admitting: Internal Medicine

## 2019-03-05 ENCOUNTER — Other Ambulatory Visit: Payer: Self-pay

## 2019-03-05 ENCOUNTER — Inpatient Hospital Stay: Payer: PPO

## 2019-03-05 ENCOUNTER — Encounter: Payer: Self-pay | Admitting: Internal Medicine

## 2019-03-05 VITALS — BP 157/51 | HR 74 | Temp 98.7°F | Resp 20 | Ht 65.0 in | Wt 173.7 lb

## 2019-03-05 DIAGNOSIS — I2699 Other pulmonary embolism without acute cor pulmonale: Secondary | ICD-10-CM

## 2019-03-05 DIAGNOSIS — I10 Essential (primary) hypertension: Secondary | ICD-10-CM | POA: Diagnosis not present

## 2019-03-05 DIAGNOSIS — Z5112 Encounter for antineoplastic immunotherapy: Secondary | ICD-10-CM

## 2019-03-05 DIAGNOSIS — Z95828 Presence of other vascular implants and grafts: Secondary | ICD-10-CM

## 2019-03-05 DIAGNOSIS — C3491 Malignant neoplasm of unspecified part of right bronchus or lung: Secondary | ICD-10-CM

## 2019-03-05 LAB — CMP (CANCER CENTER ONLY)
ALT: <6 U/L (ref 0–44)
AST: 11 U/L — ABNORMAL LOW (ref 15–41)
Albumin: 3.3 g/dL — ABNORMAL LOW (ref 3.5–5.0)
Alkaline Phosphatase: 72 U/L (ref 38–126)
Anion gap: 9 (ref 5–15)
BUN: 24 mg/dL — ABNORMAL HIGH (ref 8–23)
CO2: 30 mmol/L (ref 22–32)
Calcium: 9 mg/dL (ref 8.9–10.3)
Chloride: 107 mmol/L (ref 98–111)
Creatinine: 1.33 mg/dL — ABNORMAL HIGH (ref 0.44–1.00)
GFR, Est AFR Am: 45 mL/min — ABNORMAL LOW (ref >60)
GFR, Estimated: 39 mL/min — ABNORMAL LOW (ref >60)
Glucose, Bld: 89 mg/dL (ref 70–99)
Potassium: 4.5 mmol/L (ref 3.5–5.1)
Sodium: 146 mmol/L — ABNORMAL HIGH (ref 135–145)
Total Bilirubin: 0.3 mg/dL (ref 0.3–1.2)
Total Protein: 6.8 g/dL (ref 6.5–8.1)

## 2019-03-05 LAB — CBC WITH DIFFERENTIAL (CANCER CENTER ONLY)
Abs Immature Granulocytes: 0.03 10*3/uL (ref 0.00–0.07)
Basophils Absolute: 0.1 10*3/uL (ref 0.0–0.1)
Basophils Relative: 1 %
Eosinophils Absolute: 0.3 10*3/uL (ref 0.0–0.5)
Eosinophils Relative: 3 %
HCT: 37.4 % (ref 36.0–46.0)
Hemoglobin: 11.7 g/dL — ABNORMAL LOW (ref 12.0–15.0)
Immature Granulocytes: 0 %
Lymphocytes Relative: 22 %
Lymphs Abs: 1.9 10*3/uL (ref 0.7–4.0)
MCH: 32.1 pg (ref 26.0–34.0)
MCHC: 31.3 g/dL (ref 30.0–36.0)
MCV: 102.7 fL — ABNORMAL HIGH (ref 80.0–100.0)
Monocytes Absolute: 0.8 10*3/uL (ref 0.1–1.0)
Monocytes Relative: 9 %
Neutro Abs: 5.8 10*3/uL (ref 1.7–7.7)
Neutrophils Relative %: 65 %
Platelet Count: 190 10*3/uL (ref 150–400)
RBC: 3.64 MIL/uL — ABNORMAL LOW (ref 3.87–5.11)
RDW: 12.8 % (ref 11.5–15.5)
WBC Count: 8.9 10*3/uL (ref 4.0–10.5)
nRBC: 0 % (ref 0.0–0.2)

## 2019-03-05 LAB — TSH: TSH: 0.16 u[IU]/mL — ABNORMAL LOW (ref 0.308–3.960)

## 2019-03-05 MED ORDER — DIPHENHYDRAMINE HCL 50 MG/ML IJ SOLN
25.0000 mg | Freq: Once | INTRAMUSCULAR | Status: AC
  Administered 2019-03-05: 25 mg via INTRAVENOUS

## 2019-03-05 MED ORDER — SODIUM CHLORIDE 0.9 % IV SOLN
200.0000 mg | Freq: Once | INTRAVENOUS | Status: AC
  Administered 2019-03-05: 200 mg via INTRAVENOUS
  Filled 2019-03-05: qty 8

## 2019-03-05 MED ORDER — FAMOTIDINE IN NACL 20-0.9 MG/50ML-% IV SOLN
20.0000 mg | Freq: Once | INTRAVENOUS | Status: AC
  Administered 2019-03-05: 20 mg via INTRAVENOUS

## 2019-03-05 MED ORDER — SODIUM CHLORIDE 0.9% FLUSH
10.0000 mL | INTRAVENOUS | Status: DC | PRN
  Administered 2019-03-05: 10 mL
  Filled 2019-03-05: qty 10

## 2019-03-05 MED ORDER — HEPARIN SOD (PORK) LOCK FLUSH 100 UNIT/ML IV SOLN
500.0000 [IU] | Freq: Once | INTRAVENOUS | Status: AC | PRN
  Administered 2019-03-05: 500 [IU]
  Filled 2019-03-05: qty 5

## 2019-03-05 MED ORDER — ACETAMINOPHEN 325 MG PO TABS
ORAL_TABLET | ORAL | Status: AC
  Filled 2019-03-05: qty 2

## 2019-03-05 MED ORDER — DIPHENHYDRAMINE HCL 50 MG/ML IJ SOLN
INTRAMUSCULAR | Status: AC
  Filled 2019-03-05: qty 1

## 2019-03-05 MED ORDER — SODIUM CHLORIDE 0.9 % IV SOLN
Freq: Once | INTRAVENOUS | Status: AC
  Filled 2019-03-05: qty 250

## 2019-03-05 MED ORDER — FAMOTIDINE IN NACL 20-0.9 MG/50ML-% IV SOLN
INTRAVENOUS | Status: AC
  Filled 2019-03-05: qty 50

## 2019-03-05 MED ORDER — ACETAMINOPHEN 325 MG PO TABS
650.0000 mg | ORAL_TABLET | Freq: Once | ORAL | Status: AC
  Administered 2019-03-05: 650 mg via ORAL

## 2019-03-05 NOTE — Patient Instructions (Signed)
[facility] Discharge Instructions for Patients Receiving Chemotherapy  Today you received the following chemotherapy agents Keytruda  To help prevent nausea and vomiting after your treatment, we encourage you to take your nausea medication.   If you develop nausea and vomiting that is not controlled by your nausea medication, call the clinic.   BELOW ARE SYMPTOMS THAT SHOULD BE REPORTED IMMEDIATELY:  *FEVER GREATER THAN 100.5 F  *CHILLS WITH OR WITHOUT FEVER  NAUSEA AND VOMITING THAT IS NOT CONTROLLED WITH YOUR NAUSEA MEDICATION  *UNUSUAL SHORTNESS OF BREATH  *UNUSUAL BRUISING OR BLEEDING  TENDERNESS IN MOUTH AND THROAT WITH OR WITHOUT PRESENCE OF ULCERS  *URINARY PROBLEMS  *BOWEL PROBLEMS  UNUSUAL RASH Items with * indicate a potential emergency and should be followed up as soon as possible.  Feel free to call the clinic should you have any questions or concerns. The clinic phone number is [phone].  Please show the [facility] at check-in to the Emergency Department and triage nurse.  Pembrolizumab injection What is this medicine? PEMBROLIZUMAB (pem broe liz ue mab) is a monoclonal antibody. It is used to treat bladder cancer, cervical cancer, endometrial cancer, esophageal cancer, head and neck cancer, hepatocellular cancer, Hodgkin lymphoma, kidney cancer, lymphoma, melanoma, Merkel cell carcinoma, lung cancer, stomach cancer, urothelial cancer, and cancers that have a certain genetic condition. This medicine may be used for other purposes; ask your health care provider or pharmacist if you have questions. COMMON BRAND NAME(S): Keytruda What should I tell my health care provider before I take this medicine? They need to know if you have any of these conditions:  diabetes  immune system problems  inflammatory bowel disease  liver disease  lung or breathing disease  lupus  received or scheduled to receive an organ transplant or a  stem-cell transplant that uses donor stem cells  an unusual or allergic reaction to pembrolizumab, other medicines, foods, dyes, or preservatives  pregnant or trying to get pregnant  breast-feeding How should I use this medicine? This medicine is for infusion into a vein. It is given by a health care professional in a hospital or clinic setting. A special MedGuide will be given to you before each treatment. Be sure to read this information carefully each time. Talk to your pediatrician regarding the use of this medicine in children. While this drug may be prescribed for selected conditions, precautions do apply. Overdosage: If you think you have taken too much of this medicine contact a poison control center or emergency room at once. NOTE: This medicine is only for you. Do not share this medicine with others. What if I miss a dose? It is important not to miss your dose. Call your doctor or health care professional if you are unable to keep an appointment. What may interact with this medicine? Interactions have not been studied. Give your health care provider a list of all the medicines, herbs, non-prescription drugs, or dietary supplements you use. Also tell them if you smoke, drink alcohol, or use illegal drugs. Some items may interact with your medicine. This list may not describe all possible interactions. Give your health care provider a list of all the medicines, herbs, non-prescription drugs, or dietary supplements you use. Also tell them if you smoke, drink alcohol, or use illegal drugs. Some items may interact with your medicine. What should I watch for while using this medicine? Your condition will be monitored carefully while you are receiving this medicine. You may need blood work  done while you are taking this medicine. Do not become pregnant while taking this medicine or for 4 months after stopping it. Women should inform their doctor if they wish to become pregnant or think they  might be pregnant. There is a potential for serious side effects to an unborn child. Talk to your health care professional or pharmacist for more information. Do not breast-feed an infant while taking this medicine or for 4 months after the last dose. What side effects may I notice from receiving this medicine? Side effects that you should report to your doctor or health care professional as soon as possible:  allergic reactions like skin rash, itching or hives, swelling of the face, lips, or tongue  bloody or black, tarry  breathing problems  changes in vision  chest pain  chills  confusion  constipation  cough  diarrhea  dizziness or feeling faint or lightheaded  fast or irregular heartbeat  fever  flushing  hair loss  joint pain  low blood counts - this medicine may decrease the number of white blood cells, red blood cells and platelets. You may be at increased risk for infections and bleeding.  muscle pain  muscle weakness  persistent headache  redness, blistering, peeling or loosening of the skin, including inside the mouth  signs and symptoms of high blood sugar such as dizziness; dry mouth; dry skin; fruity breath; nausea; stomach pain; increased hunger or thirst; increased urination  signs and symptoms of kidney injury like trouble passing urine or change in the amount of urine  signs and symptoms of liver injury like dark urine, light-colored stools, loss of appetite, nausea, right upper belly pain, yellowing of the eyes or skin  sweating  swollen lymph nodes  weight loss Side effects that usually do not require medical attention (report to your doctor or health care professional if they continue or are bothersome):  decreased appetite  muscle pain  tiredness This list may not describe all possible side effects. Call your doctor for medical advice about side effects. You may report side effects to FDA at 1-800-FDA-1088. Where should I keep my  medicine? This drug is given in a hospital or clinic and will not be stored at home. NOTE: This sheet is a summary. It may not cover all possible information. If you have questions about this medicine, talk to your doctor, pharmacist, or health care provider.  2020 Elsevier/Gold Standard (2018-03-27 13:46:58)

## 2019-03-05 NOTE — Progress Notes (Signed)
[facility] Telephone:([phone]   Fax:([phone]) [phone]  OFFICE PROGRESS NOTE  [name], MD [place] [address]  DIAGNOSIS:  1) Stage IV (T3, N0, M1 a)non-small cell lung cancer, well-differentiated adenocarcinoma presented with multifocal disease involving mainly the right upper lobe as well as the right middle lobe and left lower lobe of the lung diagnosed in May 2019. 2) bilateral small pulmonary emboli diagnosed incidentally on CT scan of the chest on October 16, 2017  Guardant 360 testing was negative for actionable mutations.  PRIOR THERAPY:None  CURRENT THERAPY: 1) Carboplatin for an AUC of 5, Alimta 500 mg/m2 and Keytruda 200 mg IV given every 3 weeks. First dosegiven on 08/18/2017.Status post 16 cycles.  Starting from cycle #5 the patient is on maintenance Alimta and Ketruda (pembrolizumab) every 3 weeks.  Her treatment has been on hold for several months. She will start treatment with single agent Keytruda on February 12, 2019. 2) Xarelto 15 mg p.o. twice daily for the first 3 weeks followed by 20 mg p.o. Daily.  INTERVAL HISTORY: Erin Myers 78 y.o. female returns to the clinic today for follow-up visit.  The patient is feeling fine today with no concerning complaints except for the baseline shortness of breath and she is currently on home oxygen.  She denied having any chest pain, cough or hemoptysis.  She denied having any fever or chills.  She has no nausea, vomiting, diarrhea or constipation.  She has no headache or visual changes.  She gained 3 pounds since her last visit.  She has hypersensitivity reaction to the treatment with Keytruda last dose with generalized pain all over her body.  She was treated with Benadryl, Tylenol and Pepcid and she felt much better and completed her treatment.  She is here today for evaluation before starting cycle #2.  MEDICAL HISTORY: Past Medical History:  Diagnosis Date  .  Anxiety   . Diabetes mellitus without complication ([facility])   . Dyslipidemia   . HTN (hypertension)   . Hyperlipemia   . Hypothyroid   . Hypothyroidism   . Lung cancer ([facility]) 2000   Carcinoid, s/p RLL lobectomy  . Obesity   . Ovarian cancer ([facility])   . Palpitations 2009 and 2006   Negative nuclear stress test   . Sleep apnea    on C-pap    ALLERGIES:  is allergic to adhesive [tape]; codeine; lactose intolerance (gi); monosodium glutamate; and sulfa antibiotics.  MEDICATIONS:  Current Outpatient Medications  Medication Sig Dispense Refill  . acetaminophen (TYLENOL) 500 MG tablet Take 500 mg by mouth every 6 (six) hours as needed.    . benzonatate (TESSALON) 200 MG capsule Take 1 capsule (200 mg total) by mouth 3 (three) times daily as needed for cough. 30 capsule 1  . cholecalciferol (VITAMIN D3) 25 MCG (1000 UT) tablet Take 1,000 Units by mouth daily.    . clorazepate (TRANXENE) 7.5 MG tablet Take 7.5 mg by mouth 2 (two) times daily as needed for anxiety.    . docusate sodium (COLACE) 100 MG capsule Take 100 mg by mouth 2 (two) times daily.    . folic acid (FOLVITE) 1 MG tablet TAKE 1 TABLET BY MOUTH DAILY. 30 tablet 2  . guaiFENesin-codeine (CHERATUSSIN AC) 100-10 MG/5ML syrup Take 5 mLs by mouth every 6 (six) hours as needed for cough. 240 mL 0  . ipratropium-albuterol (DUONEB) 0.5-2.5 (3) MG/3ML SOLN INHALE THE CONTENTS OF 1 VIAL VIA NEBULIZER 3  TIMES A DAY. 120 mL 11  . levothyroxine (SYNTHROID, LEVOTHROID) 175 MCG tablet Take 175 mcg by mouth daily before breakfast.    . lidocaine-prilocaine (EMLA) cream Apply 1 application topically as needed. 30 g 0  . metoprolol succinate (TOPROL-XL) 50 MG 24 hr tablet Take 50 mg by mouth 2 (two) times daily.     . Multiple Vitamins-Minerals (VISION FORMULA 2 PO) Take by mouth. OTC "Vision pills for Macular degeneration"    . prochlorperazine (COMPAZINE) 10 MG tablet Take 1 tablet (10 mg total) by mouth every 6 (six) hours as needed for nausea or  vomiting. 30 tablet 0  . rosuvastatin (CRESTOR) 10 MG tablet Take 10 mg by mouth every evening.     . SYMBICORT 160-4.5 MCG/ACT inhaler INHALE 2 PUFFS INTO THE LUNGS 2 TIMES DAILY. 10.2 g 6  . valACYclovir (VALTREX) 500 MG tablet Take 500 mg by mouth daily.    [name] 20 MG TABS tablet TAKE 1 TABLET BY MOUTH DAILY WITH SUPPER. 30 tablet 1   No current facility-administered medications for this visit.   Facility-Administered Medications Ordered in Other Visits  Medication Dose Route Frequency Provider Last Rate Last Admin  . sodium chloride flush (NS) 0.9 % injection 10 mL  10 mL Intracatheter PRN Curt Bears, MD   10 mL at 03/05/19 1314    SURGICAL HISTORY:  Past Surgical History:  Procedure Laterality Date  . ABDOMINAL HYSTERECTOMY  2001  . GRADED EXERCISE TOLERANCE TEST  08/2015    Blood pressure demonstrated a hypertensive response to exercise.  There was less than 0.26mm of horizontal ST segment depression in the inferolateral leads. No diagnostic criterial for ischemia. Moderately impaired exercise tolerance. The patient achieved 5.8 mets. This is a low risk study.  . IR FLUORO GUIDE PORT INSERTION RIGHT  08/17/2017  . IR THORACENTESIS ASP PLEURAL SPACE W/IMG GUIDE  07/04/2018  . IR US GUIDE VASC ACCESS RIGHT  08/17/2017  . LOBECTOMY  2000   RLL  . NM MYOCAR PERF WALL MOTION  08/20/2007   EF 74%  EXERCISE CAPACITY 7 METS  . OOPHORECTOMY  2001  . TONSILLECTOMY    . VIDEO BRONCHOSCOPY Bilateral 07/20/2017   Procedure: VIDEO BRONCHOSCOPY WITH FLUORO;  Surgeon: Collene Gobble, MD;  Location: [facility];  Service: Cardiopulmonary;  Laterality: Bilateral;    REVIEW OF SYSTEMS:  A comprehensive review of systems was negative except for: Constitutional: positive for fatigue Respiratory: positive for dyspnea on exertion   PHYSICAL EXAMINATION: General appearance: alert, cooperative, fatigued and no distress Head: Normocephalic, without obvious abnormality, atraumatic Neck: no  adenopathy, no JVD, supple, symmetrical, trachea midline and thyroid not enlarged, symmetric, no tenderness/mass/nodules Lymph nodes: Cervical, supraclavicular, and axillary nodes normal. Resp: clear to auscultation bilaterally Back: symmetric, no curvature. ROM normal. No CVA tenderness. Cardio: regular rate and rhythm, S1, S2 normal, no murmur, click, rub or gallop GI: soft, non-tender; bowel sounds normal; no masses,  no organomegaly Extremities: extremities normal, atraumatic, no cyanosis or edema  ECOG PERFORMANCE STATUS: 1 - Symptomatic but completely ambulatory  Blood pressure (!) 157/51, pulse 74, temperature 98.7 F (37.1 C), temperature source Temporal, resp. rate 20, height 5\' 5"  (1.651 m), weight 173 lb 11.2 oz (78.8 kg), SpO2 93 %.  LABORATORY DATA: Lab Results  Component Value Date   WBC 8.9 03/05/2019   HGB 11.7 (L) 03/05/2019   HCT 37.4 03/05/2019   MCV 102.7 (H) 03/05/2019   PLT 190 03/05/2019      Chemistry  Component Value Date/Time   NA 145 02/12/2019 1250   K 4.2 02/12/2019 1250   CL 108 02/12/2019 1250   CO2 27 02/12/2019 1250   BUN 23 02/12/2019 1250   CREATININE 1.29 (H) 02/12/2019 1250      Component Value Date/Time   CALCIUM 8.9 02/12/2019 1250   ALKPHOS 63 02/12/2019 1250   AST 10 (L) 02/12/2019 1250   ALT 8 02/12/2019 1250   BILITOT 0.3 02/12/2019 1250       RADIOGRAPHIC STUDIES: No results found.   ASSESSMENT AND PLAN: This is a very pleasant 78 years old white female with stage IV non-small cell lung cancer, adenocarcinoma with no actionable mutations. She is currently undergoing systemic chemotherapy with carboplatin, Alimta and Ketruda (pembrolizumab) status post 15 cycles.  Starting from cycle #5 she is on maintenance treatment with Alimta and Ketruda (pembrolizumab). She has been in observation for several months with no treatment. Imaging studies showed evidence for disease progression. The patient resumed treatment with single  agent Keytruda status post 1 cycle.  Her first cycle was started on February 12, 2019. The patient tolerated the first treatment well except for the reaction with generalized pain that was treated with premedication with Tylenol, Benadryl and Pepcid and she felt much better.  We will proceed with cycle #2 today but with premedication before starting her treatment. For hypertension the patient will monitor her blood pressure closely at home and take her blood pressure medication as prescribed. For the history of pulmonary embolism she is currently on Xarelto. She was advised to call immediately if she has any concerning symptoms in the interval.  The patient voices understanding of current disease status and treatment options and is in agreement with the current care plan. All questions were answered. The patient knows to call the clinic with any problems, questions or concerns. We can certainly see the patient much sooner if necessary.  Disclaimer: This note was dictated with voice recognition software. Similar sounding words can inadvertently be transcribed and may not be corrected upon review.

## 2019-03-05 NOTE — Patient Instructions (Signed)

## 2019-03-09 DIAGNOSIS — J449 Chronic obstructive pulmonary disease, unspecified: Secondary | ICD-10-CM | POA: Diagnosis not present

## 2019-03-26 ENCOUNTER — Inpatient Hospital Stay: Payer: PPO | Attending: Internal Medicine

## 2019-03-26 ENCOUNTER — Inpatient Hospital Stay (HOSPITAL_BASED_OUTPATIENT_CLINIC_OR_DEPARTMENT_OTHER): Payer: PPO | Admitting: Internal Medicine

## 2019-03-26 ENCOUNTER — Other Ambulatory Visit: Payer: Self-pay

## 2019-03-26 ENCOUNTER — Inpatient Hospital Stay: Payer: PPO

## 2019-03-26 ENCOUNTER — Encounter: Payer: Self-pay | Admitting: Internal Medicine

## 2019-03-26 VITALS — BP 158/61 | HR 68

## 2019-03-26 DIAGNOSIS — I10 Essential (primary) hypertension: Secondary | ICD-10-CM | POA: Diagnosis not present

## 2019-03-26 DIAGNOSIS — E119 Type 2 diabetes mellitus without complications: Secondary | ICD-10-CM | POA: Diagnosis not present

## 2019-03-26 DIAGNOSIS — C3481 Malignant neoplasm of overlapping sites of right bronchus and lung: Secondary | ICD-10-CM | POA: Diagnosis not present

## 2019-03-26 DIAGNOSIS — C3490 Malignant neoplasm of unspecified part of unspecified bronchus or lung: Secondary | ICD-10-CM

## 2019-03-26 DIAGNOSIS — E785 Hyperlipidemia, unspecified: Secondary | ICD-10-CM | POA: Insufficient documentation

## 2019-03-26 DIAGNOSIS — C7802 Secondary malignant neoplasm of left lung: Secondary | ICD-10-CM | POA: Diagnosis not present

## 2019-03-26 DIAGNOSIS — Z8041 Family history of malignant neoplasm of ovary: Secondary | ICD-10-CM | POA: Insufficient documentation

## 2019-03-26 DIAGNOSIS — E669 Obesity, unspecified: Secondary | ICD-10-CM | POA: Diagnosis not present

## 2019-03-26 DIAGNOSIS — Z5112 Encounter for antineoplastic immunotherapy: Secondary | ICD-10-CM | POA: Diagnosis not present

## 2019-03-26 DIAGNOSIS — F419 Anxiety disorder, unspecified: Secondary | ICD-10-CM | POA: Diagnosis not present

## 2019-03-26 DIAGNOSIS — C3491 Malignant neoplasm of unspecified part of right bronchus or lung: Secondary | ICD-10-CM

## 2019-03-26 DIAGNOSIS — Z86711 Personal history of pulmonary embolism: Secondary | ICD-10-CM | POA: Insufficient documentation

## 2019-03-26 DIAGNOSIS — Z7901 Long term (current) use of anticoagulants: Secondary | ICD-10-CM | POA: Insufficient documentation

## 2019-03-26 DIAGNOSIS — G4730 Sleep apnea, unspecified: Secondary | ICD-10-CM | POA: Insufficient documentation

## 2019-03-26 DIAGNOSIS — E039 Hypothyroidism, unspecified: Secondary | ICD-10-CM | POA: Diagnosis not present

## 2019-03-26 DIAGNOSIS — Z95828 Presence of other vascular implants and grafts: Secondary | ICD-10-CM

## 2019-03-26 DIAGNOSIS — Z79899 Other long term (current) drug therapy: Secondary | ICD-10-CM | POA: Insufficient documentation

## 2019-03-26 LAB — CBC WITH DIFFERENTIAL (CANCER CENTER ONLY)
Abs Immature Granulocytes: 0.03 10*3/uL (ref 0.00–0.07)
Basophils Absolute: 0.1 10*3/uL (ref 0.0–0.1)
Basophils Relative: 1 %
Eosinophils Absolute: 0.3 10*3/uL (ref 0.0–0.5)
Eosinophils Relative: 3 %
HCT: 39.6 % (ref 36.0–46.0)
Hemoglobin: 12.5 g/dL (ref 12.0–15.0)
Immature Granulocytes: 0 %
Lymphocytes Relative: 18 %
Lymphs Abs: 1.8 10*3/uL (ref 0.7–4.0)
MCH: 31.9 pg (ref 26.0–34.0)
MCHC: 31.6 g/dL (ref 30.0–36.0)
MCV: 101 fL — ABNORMAL HIGH (ref 80.0–100.0)
Monocytes Absolute: 0.6 10*3/uL (ref 0.1–1.0)
Monocytes Relative: 6 %
Neutro Abs: 7.3 10*3/uL (ref 1.7–7.7)
Neutrophils Relative %: 72 %
Platelet Count: 221 10*3/uL (ref 150–400)
RBC: 3.92 MIL/uL (ref 3.87–5.11)
RDW: 13.2 % (ref 11.5–15.5)
WBC Count: 10.1 10*3/uL (ref 4.0–10.5)
nRBC: 0 % (ref 0.0–0.2)

## 2019-03-26 LAB — CMP (CANCER CENTER ONLY)
ALT: <6 U/L (ref 0–44)
AST: 10 U/L — ABNORMAL LOW (ref 15–41)
Albumin: 3.3 g/dL — ABNORMAL LOW (ref 3.5–5.0)
Alkaline Phosphatase: 74 U/L (ref 38–126)
Anion gap: 11 (ref 5–15)
BUN: 27 mg/dL — ABNORMAL HIGH (ref 8–23)
CO2: 26 mmol/L (ref 22–32)
Calcium: 8.9 mg/dL (ref 8.9–10.3)
Chloride: 107 mmol/L (ref 98–111)
Creatinine: 1.44 mg/dL — ABNORMAL HIGH (ref 0.44–1.00)
GFR, Est AFR Am: 41 mL/min — ABNORMAL LOW (ref >60)
GFR, Estimated: 35 mL/min — ABNORMAL LOW (ref >60)
Glucose, Bld: 98 mg/dL (ref 70–99)
Potassium: 4.2 mmol/L (ref 3.5–5.1)
Sodium: 144 mmol/L (ref 135–145)
Total Bilirubin: 0.3 mg/dL (ref 0.3–1.2)
Total Protein: 7.3 g/dL (ref 6.5–8.1)

## 2019-03-26 LAB — TSH: TSH: 1.048 u[IU]/mL (ref 0.308–3.960)

## 2019-03-26 MED ORDER — DIPHENHYDRAMINE HCL 25 MG PO CAPS
ORAL_CAPSULE | ORAL | Status: AC
  Filled 2019-03-26: qty 1

## 2019-03-26 MED ORDER — SODIUM CHLORIDE 0.9% FLUSH
10.0000 mL | INTRAVENOUS | Status: DC | PRN
  Administered 2019-03-26: 10 mL
  Filled 2019-03-26: qty 10

## 2019-03-26 MED ORDER — DIPHENHYDRAMINE HCL 50 MG/ML IJ SOLN
25.0000 mg | Freq: Once | INTRAMUSCULAR | Status: AC
  Administered 2019-03-26: 25 mg via INTRAVENOUS

## 2019-03-26 MED ORDER — ACETAMINOPHEN 325 MG PO TABS
ORAL_TABLET | ORAL | Status: AC
  Filled 2019-03-26: qty 2

## 2019-03-26 MED ORDER — HEPARIN SOD (PORK) LOCK FLUSH 100 UNIT/ML IV SOLN
500.0000 [IU] | Freq: Once | INTRAVENOUS | Status: AC | PRN
  Administered 2019-03-26: 500 [IU]
  Filled 2019-03-26: qty 5

## 2019-03-26 MED ORDER — CYANOCOBALAMIN 1000 MCG/ML IJ SOLN
1000.0000 ug | Freq: Once | INTRAMUSCULAR | Status: AC
  Administered 2019-03-26: 1000 ug via INTRAMUSCULAR
  Filled 2019-03-26: qty 1

## 2019-03-26 MED ORDER — SODIUM CHLORIDE 0.9 % IV SOLN
Freq: Once | INTRAVENOUS | Status: DC
  Filled 2019-03-26: qty 250

## 2019-03-26 MED ORDER — FAMOTIDINE IN NACL 20-0.9 MG/50ML-% IV SOLN
INTRAVENOUS | Status: AC
  Filled 2019-03-26: qty 50

## 2019-03-26 MED ORDER — FAMOTIDINE IN NACL 20-0.9 MG/50ML-% IV SOLN
20.0000 mg | Freq: Once | INTRAVENOUS | Status: AC
  Administered 2019-03-26: 20 mg via INTRAVENOUS

## 2019-03-26 MED ORDER — DIPHENHYDRAMINE HCL 50 MG/ML IJ SOLN
INTRAMUSCULAR | Status: AC
  Filled 2019-03-26: qty 1

## 2019-03-26 MED ORDER — ACETAMINOPHEN 325 MG PO TABS
650.0000 mg | ORAL_TABLET | Freq: Once | ORAL | Status: AC
  Administered 2019-03-26: 650 mg via ORAL

## 2019-03-26 MED ORDER — SODIUM CHLORIDE 0.9 % IV SOLN
200.0000 mg | Freq: Once | INTRAVENOUS | Status: AC
  Administered 2019-03-26: 200 mg via INTRAVENOUS
  Filled 2019-03-26: qty 8

## 2019-03-26 MED ORDER — SODIUM CHLORIDE 0.9 % IV SOLN
Freq: Once | INTRAVENOUS | Status: AC
  Filled 2019-03-26: qty 250

## 2019-03-26 NOTE — Progress Notes (Signed)
[facility] [phone]   [phone]  OFFICE PROGRESS NOTE  [name], MD [place] [address]  DIAGNOSIS:  1) Stage IV (T3, N0, M1 a)non-small cell lung cancer, well-differentiated adenocarcinoma presented with multifocal disease involving mainly the right upper lobe as well as the right middle lobe and left lower lobe of the lung diagnosed in May 2019. 2) bilateral small pulmonary emboli diagnosed incidentally on CT scan of the chest on October 16, 2017  Guardant 360 testing was negative for actionable mutations.  PRIOR THERAPY:None  CURRENT THERAPY: 1) Carboplatin for an AUC of 5, Alimta 500 mg/m2 and Keytruda 200 mg IV given every 3 weeks. First dosegiven on 08/18/2017.Status post 17 cycles.  Starting from cycle #5 the patient is on maintenance Alimta and Ketruda (pembrolizumab) every 3 weeks.  Her treatment has been on hold for several months. She will start treatment with single agent Keytruda on February 12, 2019. 2) Xarelto 15 mg p.o. twice daily for the first 3 weeks followed by 20 mg p.o. Daily.  INTERVAL HISTORY: Senetra Dillin 79 y.o. female returns to the clinic today for follow-up visit.  The patient is feeling fine today with no concerning complaints except for the baseline shortness of breath and she is currently on 3 L nasal cannula of home oxygen.  She has intermittent right-sided chest pain but no significant cough or hemoptysis.  She denied having any recent weight loss or night sweats.  She has no nausea, vomiting, diarrhea or constipation.  She has been tolerating her treatment with single agent Keytruda fairly well.  She is here today for evaluation before starting cycle #3 of this treatment.  MEDICAL HISTORY: Past Medical History:  Diagnosis Date  . Anxiety   . Diabetes mellitus without complication ([place])   . Dyslipidemia   . HTN (hypertension)   . Hyperlipemia   . Hypothyroid   .  Hypothyroidism   . Lung cancer ([place]) 2000   Carcinoid, s/p RLL lobectomy  . Obesity   . Ovarian cancer ([place])   . Palpitations 2009 and 2006   Negative nuclear stress test   . Sleep apnea    on C-pap    ALLERGIES:  is allergic to adhesive [tape]; codeine; lactose intolerance (gi); monosodium glutamate; and sulfa antibiotics.  MEDICATIONS:  Current Outpatient Medications  Medication Sig Dispense Refill  . acetaminophen (TYLENOL) 500 MG tablet Take 500 mg by mouth every 6 (six) hours as needed.    . benzonatate (TESSALON) 200 MG capsule Take 1 capsule (200 mg total) by mouth 3 (three) times daily as needed for cough. 30 capsule 1  . cholecalciferol (VITAMIN D3) 25 MCG (1000 UT) tablet Take 1,000 Units by mouth daily.    . clorazepate (TRANXENE) 7.5 MG tablet Take 7.5 mg by mouth 2 (two) times daily as needed for anxiety.    . docusate sodium (COLACE) 100 MG capsule Take 100 mg by mouth 2 (two) times daily.    . folic acid (FOLVITE) 1 MG tablet TAKE 1 TABLET BY MOUTH DAILY. 30 tablet 2  . guaiFENesin-codeine (CHERATUSSIN AC) 100-10 MG/5ML syrup Take 5 mLs by mouth every 6 (six) hours as needed for cough. 240 mL 0  . ipratropium-albuterol (DUONEB) 0.5-2.5 (3) MG/3ML SOLN INHALE THE CONTENTS OF 1 VIAL VIA NEBULIZER 3 TIMES A DAY. 120 mL 11  . levothyroxine (SYNTHROID, LEVOTHROID) 175 MCG tablet Take 175 mcg by mouth daily before breakfast.    . lidocaine-prilocaine (  EMLA) cream Apply 1 application topically as needed. 30 g 0  . metoprolol succinate (TOPROL-XL) 50 MG 24 hr tablet Take 50 mg by mouth 2 (two) times daily.     . Multiple Vitamins-Minerals (VISION FORMULA 2 PO) Take by mouth. OTC "Vision pills for Macular degeneration"    . prochlorperazine (COMPAZINE) 10 MG tablet Take 1 tablet (10 mg total) by mouth every 6 (six) hours as needed for nausea or vomiting. 30 tablet 0  . rosuvastatin (CRESTOR) 10 MG tablet Take 10 mg by mouth every evening.     . SYMBICORT 160-4.5 MCG/ACT inhaler  INHALE 2 PUFFS INTO THE LUNGS 2 TIMES DAILY. 10.2 g 6  . valACYclovir (VALTREX) 500 MG tablet Take 500 mg by mouth daily.    [name] 20 MG TABS tablet TAKE 1 TABLET BY MOUTH DAILY WITH SUPPER. 30 tablet 1   No current facility-administered medications for this visit.    SURGICAL HISTORY:  Past Surgical History:  Procedure Laterality Date  . ABDOMINAL HYSTERECTOMY  2001  . GRADED EXERCISE TOLERANCE TEST  08/2015    Blood pressure demonstrated a hypertensive response to exercise.  There was less than 0.26mm of horizontal ST segment depression in the inferolateral leads. No diagnostic criterial for ischemia. Moderately impaired exercise tolerance. The patient achieved 5.8 mets. This is a low risk study.  . IR FLUORO GUIDE PORT INSERTION RIGHT  08/17/2017  . IR THORACENTESIS ASP PLEURAL SPACE W/IMG GUIDE  07/04/2018  . IR US GUIDE VASC ACCESS RIGHT  08/17/2017  . LOBECTOMY  2000   RLL  . NM MYOCAR PERF WALL MOTION  08/20/2007   EF 74%  EXERCISE CAPACITY 7 METS  . OOPHORECTOMY  2001  . TONSILLECTOMY    . VIDEO BRONCHOSCOPY Bilateral 07/20/2017   Procedure: VIDEO BRONCHOSCOPY WITH FLUORO;  Surgeon: Collene Gobble, MD;  Location: [facility];  Service: Cardiopulmonary;  Laterality: Bilateral;    REVIEW OF SYSTEMS:  Constitutional: positive for fatigue Eyes: negative Ears, nose, mouth, throat, and face: negative Respiratory: positive for dyspnea on exertion and pleurisy/chest pain Cardiovascular: negative Gastrointestinal: negative Genitourinary:negative Integument/breast: negative Hematologic/lymphatic: negative Musculoskeletal:negative Neurological: negative Behavioral/Psych: negative Endocrine: negative Allergic/Immunologic: negative   PHYSICAL EXAMINATION: General appearance: alert, cooperative, fatigued and no distress Head: Normocephalic, without obvious abnormality, atraumatic Neck: no adenopathy, no JVD, supple, symmetrical, trachea midline and thyroid not enlarged, symmetric,  no tenderness/mass/nodules Lymph nodes: Cervical, supraclavicular, and axillary nodes normal. Resp: clear to auscultation bilaterally Back: symmetric, no curvature. ROM normal. No CVA tenderness. Cardio: regular rate and rhythm, S1, S2 normal, no murmur, click, rub or gallop GI: soft, non-tender; bowel sounds normal; no masses,  no organomegaly Extremities: extremities normal, atraumatic, no cyanosis or edema Neurologic: Alert and oriented X 3, normal strength and tone. Normal symmetric reflexes. Normal coordination and gait  ECOG PERFORMANCE STATUS: 1 - Symptomatic but completely ambulatory  Blood pressure (!) 176/62, pulse 69, temperature 98.2 F (36.8 C), temperature source Temporal, resp. rate 17, height 5\' 5"  (1.651 m), weight 172 lb 9.6 oz (78.3 kg), SpO2 96 %.  LABORATORY DATA: Lab Results  Component Value Date   WBC 10.1 03/26/2019   HGB 12.5 03/26/2019   HCT 39.6 03/26/2019   MCV 101.0 (H) 03/26/2019   PLT 221 03/26/2019      Chemistry      Component Value Date/Time   NA 146 (H) 03/05/2019 1320   K 4.5 03/05/2019 1320   CL 107 03/05/2019 1320   CO2 30 03/05/2019 1320  BUN 24 (H) 03/05/2019 1320   CREATININE 1.33 (H) 03/05/2019 1320      Component Value Date/Time   CALCIUM 9.0 03/05/2019 1320   ALKPHOS 72 03/05/2019 1320   AST 11 (L) 03/05/2019 1320   ALT <6 03/05/2019 1320   BILITOT 0.3 03/05/2019 1320       RADIOGRAPHIC STUDIES: No results found.   ASSESSMENT AND PLAN: This is a very pleasant 79 years old white female with stage IV non-small cell lung cancer, adenocarcinoma with no actionable mutations. She is currently undergoing systemic chemotherapy with carboplatin, Alimta and Ketruda (pembrolizumab) status post 15 cycles.  Starting from cycle #5 she is on maintenance treatment with Alimta and Ketruda (pembrolizumab). She has been in observation for several months with no treatment. Imaging studies showed evidence for disease progression. The patient  resumed treatment with single agent Keytruda status post 2 cycles.  Her first cycle was started on February 12, 2019. The patient has been tolerating this treatment well with no concerning adverse effects. I recommended for her to proceed with cycle #3 today as planned. I will arrange for her to have repeat CT scan of the chest, abdomen pelvis before starting cycle #4 for restaging of her disease. For hypertension, I advised the patient to continue with her current blood pressure medications and to monitor it closely at home. For the history of COPD, she will continue her routine follow-up visit and evaluation by Dr. Valeta Harms. For the pulmonary embolism she is currently on treatment with Xarelto. The patient was advised to call immediately if she has any concerning symptoms in the interval. The patient voices understanding of current disease status and treatment options and is in agreement with the current care plan. All questions were answered. The patient knows to call the clinic with any problems, questions or concerns. We can certainly see the patient much sooner if necessary.  Disclaimer: This note was dictated with voice recognition software. Similar sounding words can inadvertently be transcribed and may not be corrected upon review.

## 2019-03-26 NOTE — Patient Instructions (Signed)
[facility] Discharge Instructions for Patients Receiving Chemotherapy  Today you received the following chemotherapy agents :  Pembrolizumab.  To help prevent nausea and vomiting after your treatment, we encourage you to take your nausea medication as prescribed.   If you develop nausea and vomiting that is not controlled by your nausea medication, call the clinic.   BELOW ARE SYMPTOMS THAT SHOULD BE REPORTED IMMEDIATELY:  *FEVER GREATER THAN 100.5 F  *CHILLS WITH OR WITHOUT FEVER  NAUSEA AND VOMITING THAT IS NOT CONTROLLED WITH YOUR NAUSEA MEDICATION  *UNUSUAL SHORTNESS OF BREATH  *UNUSUAL BRUISING OR BLEEDING  TENDERNESS IN MOUTH AND THROAT WITH OR WITHOUT PRESENCE OF ULCERS  *URINARY PROBLEMS  *BOWEL PROBLEMS  UNUSUAL RASH Items with * indicate a potential emergency and should be followed up as soon as possible.  Feel free to call the clinic should you have any questions or concerns. The clinic phone number is [phone].  Please show the [facility] at check-in to the Emergency Department and triage nurse.

## 2019-03-27 ENCOUNTER — Telehealth: Payer: Self-pay | Admitting: Internal Medicine

## 2019-03-27 NOTE — Telephone Encounter (Signed)
Scheduled per los. Called and left msg. Mailed printout  °

## 2019-04-04 ENCOUNTER — Encounter: Payer: Self-pay | Admitting: Internal Medicine

## 2019-04-05 ENCOUNTER — Ambulatory Visit: Payer: PPO | Attending: Internal Medicine

## 2019-04-05 DIAGNOSIS — Z23 Encounter for immunization: Secondary | ICD-10-CM | POA: Insufficient documentation

## 2019-04-09 DIAGNOSIS — J449 Chronic obstructive pulmonary disease, unspecified: Secondary | ICD-10-CM | POA: Diagnosis not present

## 2019-04-15 ENCOUNTER — Ambulatory Visit (HOSPITAL_COMMUNITY)
Admission: RE | Admit: 2019-04-15 | Discharge: 2019-04-15 | Disposition: A | Discharge status: Home / Self Care | Payer: PPO | Source: Ambulatory Visit | Attending: Internal Medicine | Admitting: Internal Medicine

## 2019-04-15 ENCOUNTER — Other Ambulatory Visit: Payer: Self-pay

## 2019-04-15 DIAGNOSIS — C3490 Malignant neoplasm of unspecified part of unspecified bronchus or lung: Secondary | ICD-10-CM | POA: Diagnosis not present

## 2019-04-16 ENCOUNTER — Encounter: Payer: Self-pay | Admitting: Internal Medicine

## 2019-04-16 ENCOUNTER — Inpatient Hospital Stay (HOSPITAL_BASED_OUTPATIENT_CLINIC_OR_DEPARTMENT_OTHER): Payer: PPO | Admitting: Internal Medicine

## 2019-04-16 ENCOUNTER — Inpatient Hospital Stay: Payer: PPO

## 2019-04-16 ENCOUNTER — Other Ambulatory Visit: Payer: Self-pay

## 2019-04-16 ENCOUNTER — Ambulatory Visit (HOSPITAL_BASED_OUTPATIENT_CLINIC_OR_DEPARTMENT_OTHER): Payer: PPO | Admitting: Medical

## 2019-04-16 ENCOUNTER — Inpatient Hospital Stay: Payer: PPO | Attending: Internal Medicine

## 2019-04-16 VITALS — BP 159/56 | HR 88 | Temp 98.2°F | Resp 18 | Ht 65.0 in | Wt 175.2 lb

## 2019-04-16 DIAGNOSIS — C7802 Secondary malignant neoplasm of left lung: Secondary | ICD-10-CM | POA: Diagnosis not present

## 2019-04-16 DIAGNOSIS — I2510 Atherosclerotic heart disease of native coronary artery without angina pectoris: Secondary | ICD-10-CM | POA: Diagnosis not present

## 2019-04-16 DIAGNOSIS — R61 Generalized hyperhidrosis: Secondary | ICD-10-CM | POA: Diagnosis not present

## 2019-04-16 DIAGNOSIS — I10 Essential (primary) hypertension: Secondary | ICD-10-CM | POA: Insufficient documentation

## 2019-04-16 DIAGNOSIS — E039 Hypothyroidism, unspecified: Secondary | ICD-10-CM | POA: Diagnosis not present

## 2019-04-16 DIAGNOSIS — G4730 Sleep apnea, unspecified: Secondary | ICD-10-CM | POA: Diagnosis not present

## 2019-04-16 DIAGNOSIS — R5383 Other fatigue: Secondary | ICD-10-CM | POA: Diagnosis not present

## 2019-04-16 DIAGNOSIS — I700 Atherosclerosis of aorta: Secondary | ICD-10-CM | POA: Diagnosis not present

## 2019-04-16 DIAGNOSIS — E669 Obesity, unspecified: Secondary | ICD-10-CM | POA: Insufficient documentation

## 2019-04-16 DIAGNOSIS — Z86711 Personal history of pulmonary embolism: Secondary | ICD-10-CM | POA: Insufficient documentation

## 2019-04-16 DIAGNOSIS — C3491 Malignant neoplasm of unspecified part of right bronchus or lung: Secondary | ICD-10-CM

## 2019-04-16 DIAGNOSIS — J449 Chronic obstructive pulmonary disease, unspecified: Secondary | ICD-10-CM | POA: Insufficient documentation

## 2019-04-16 DIAGNOSIS — Z8541 Personal history of malignant neoplasm of cervix uteri: Secondary | ICD-10-CM | POA: Insufficient documentation

## 2019-04-16 DIAGNOSIS — K8020 Calculus of gallbladder without cholecystitis without obstruction: Secondary | ICD-10-CM | POA: Insufficient documentation

## 2019-04-16 DIAGNOSIS — Z7901 Long term (current) use of anticoagulants: Secondary | ICD-10-CM | POA: Insufficient documentation

## 2019-04-16 DIAGNOSIS — Z5112 Encounter for antineoplastic immunotherapy: Secondary | ICD-10-CM

## 2019-04-16 DIAGNOSIS — Z95828 Presence of other vascular implants and grafts: Secondary | ICD-10-CM

## 2019-04-16 DIAGNOSIS — E119 Type 2 diabetes mellitus without complications: Secondary | ICD-10-CM | POA: Diagnosis not present

## 2019-04-16 DIAGNOSIS — R232 Flushing: Secondary | ICD-10-CM | POA: Diagnosis not present

## 2019-04-16 DIAGNOSIS — F419 Anxiety disorder, unspecified: Secondary | ICD-10-CM | POA: Insufficient documentation

## 2019-04-16 DIAGNOSIS — Z79899 Other long term (current) drug therapy: Secondary | ICD-10-CM | POA: Diagnosis not present

## 2019-04-16 DIAGNOSIS — C3481 Malignant neoplasm of overlapping sites of right bronchus and lung: Secondary | ICD-10-CM | POA: Diagnosis not present

## 2019-04-16 DIAGNOSIS — E785 Hyperlipidemia, unspecified: Secondary | ICD-10-CM | POA: Diagnosis not present

## 2019-04-16 LAB — CBC WITH DIFFERENTIAL (CANCER CENTER ONLY)
Abs Immature Granulocytes: 0.03 10*3/uL (ref 0.00–0.07)
Basophils Absolute: 0.1 10*3/uL (ref 0.0–0.1)
Basophils Relative: 1 %
Eosinophils Absolute: 0.3 10*3/uL (ref 0.0–0.5)
Eosinophils Relative: 3 %
HCT: 41.3 % (ref 36.0–46.0)
Hemoglobin: 12.7 g/dL (ref 12.0–15.0)
Immature Granulocytes: 0 %
Lymphocytes Relative: 17 %
Lymphs Abs: 1.6 10*3/uL (ref 0.7–4.0)
MCH: 31.7 pg (ref 26.0–34.0)
MCHC: 30.8 g/dL (ref 30.0–36.0)
MCV: 103 fL — ABNORMAL HIGH (ref 80.0–100.0)
Monocytes Absolute: 0.7 10*3/uL (ref 0.1–1.0)
Monocytes Relative: 8 %
Neutro Abs: 7 10*3/uL (ref 1.7–7.7)
Neutrophils Relative %: 71 %
Platelet Count: 206 10*3/uL (ref 150–400)
RBC: 4.01 MIL/uL (ref 3.87–5.11)
RDW: 13.4 % (ref 11.5–15.5)
WBC Count: 9.8 10*3/uL (ref 4.0–10.5)
nRBC: 0 % (ref 0.0–0.2)

## 2019-04-16 LAB — CMP (CANCER CENTER ONLY)
ALT: 8 U/L (ref 0–44)
AST: 10 U/L — ABNORMAL LOW (ref 15–41)
Albumin: 3.3 g/dL — ABNORMAL LOW (ref 3.5–5.0)
Alkaline Phosphatase: 85 U/L (ref 38–126)
Anion gap: 10 (ref 5–15)
BUN: 22 mg/dL (ref 8–23)
CO2: 29 mmol/L (ref 22–32)
Calcium: 9 mg/dL (ref 8.9–10.3)
Chloride: 105 mmol/L (ref 98–111)
Creatinine: 1.36 mg/dL — ABNORMAL HIGH (ref 0.44–1.00)
GFR, Est AFR Am: 44 mL/min — ABNORMAL LOW (ref >60)
GFR, Estimated: 38 mL/min — ABNORMAL LOW (ref >60)
Glucose, Bld: 145 mg/dL — ABNORMAL HIGH (ref 70–99)
Potassium: 4.2 mmol/L (ref 3.5–5.1)
Sodium: 144 mmol/L (ref 135–145)
Total Bilirubin: 0.3 mg/dL (ref 0.3–1.2)
Total Protein: 7 g/dL (ref 6.5–8.1)

## 2019-04-16 LAB — TSH: TSH: 0.585 u[IU]/mL (ref 0.308–3.960)

## 2019-04-16 MED ORDER — FAMOTIDINE IN NACL 20-0.9 MG/50ML-% IV SOLN
20.0000 mg | Freq: Once | INTRAVENOUS | Status: AC
  Administered 2019-04-16: 12:00:00 20 mg via INTRAVENOUS

## 2019-04-16 MED ORDER — ACETAMINOPHEN 325 MG PO TABS
ORAL_TABLET | ORAL | Status: AC
  Filled 2019-04-16: qty 2

## 2019-04-16 MED ORDER — SODIUM CHLORIDE 0.9 % IV SOLN
Freq: Once | INTRAVENOUS | Status: AC
  Filled 2019-04-16: qty 250

## 2019-04-16 MED ORDER — HEPARIN SOD (PORK) LOCK FLUSH 100 UNIT/ML IV SOLN
500.0000 [IU] | Freq: Once | INTRAVENOUS | Status: AC | PRN
  Administered 2019-04-16: 14:00:00 500 [IU]
  Filled 2019-04-16: qty 5

## 2019-04-16 MED ORDER — FAMOTIDINE IN NACL 20-0.9 MG/50ML-% IV SOLN
INTRAVENOUS | Status: AC
  Filled 2019-04-16: qty 50

## 2019-04-16 MED ORDER — SODIUM CHLORIDE 0.9% FLUSH
10.0000 mL | INTRAVENOUS | Status: DC | PRN
  Administered 2019-04-16: 10 mL
  Filled 2019-04-16: qty 10

## 2019-04-16 MED ORDER — DIPHENHYDRAMINE HCL 50 MG/ML IJ SOLN
INTRAMUSCULAR | Status: AC
  Filled 2019-04-16: qty 1

## 2019-04-16 MED ORDER — SODIUM CHLORIDE 0.9 % IV SOLN
200.0000 mg | Freq: Once | INTRAVENOUS | Status: AC
  Administered 2019-04-16: 13:00:00 200 mg via INTRAVENOUS
  Filled 2019-04-16: qty 8

## 2019-04-16 MED ORDER — ACETAMINOPHEN 325 MG PO TABS
650.0000 mg | ORAL_TABLET | Freq: Once | ORAL | Status: AC
  Administered 2019-04-16: 12:00:00 650 mg via ORAL

## 2019-04-16 MED ORDER — SODIUM CHLORIDE 0.9% FLUSH
10.0000 mL | INTRAVENOUS | Status: DC | PRN
  Administered 2019-04-16: 14:00:00 10 mL
  Filled 2019-04-16: qty 10

## 2019-04-16 MED ORDER — DIPHENHYDRAMINE HCL 50 MG/ML IJ SOLN
25.0000 mg | Freq: Once | INTRAMUSCULAR | Status: AC
  Administered 2019-04-16: 12:00:00 25 mg via INTRAVENOUS

## 2019-04-16 NOTE — Progress Notes (Signed)
[facility] [phone]   [phone]  OFFICE PROGRESS NOTE  [name], MD [place] [address]  DIAGNOSIS:  1) Stage IV (T3, N0, M1 a)non-small cell lung cancer, well-differentiated adenocarcinoma presented with multifocal disease involving mainly the right upper lobe as well as the right middle lobe and left lower lobe of the lung diagnosed in May 2019. 2) bilateral small pulmonary emboli diagnosed incidentally on CT scan of the chest on October 16, 2017  Guardant 360 testing was negative for actionable mutations.  PRIOR THERAPY:None  CURRENT THERAPY: 1) Carboplatin for an AUC of 5, Alimta 500 mg/m2 and Keytruda 200 mg IV given every 3 weeks. First dosegiven on 08/18/2017.Status post 18 cycles.  Starting from cycle #5 the patient is on maintenance Alimta and Ketruda (pembrolizumab) every 3 weeks.  Her treatment has been on hold for several months. She will start treatment with single agent Keytruda on February 12, 2019. 2) Xarelto 15 mg p.o. twice daily for the first 3 weeks followed by 20 mg p.o. Daily.  INTERVAL HISTORY: Erin Myers 79 y.o. female returns to the clinic today for follow-up visit.  Her husband was available by face time during the visit.  The patient is feeling fine today with no concerning complaints except for the baseline shortness of breath and she is currently on home oxygen 3 L/minute nasal cannula.  She denied having any current chest pain but has cough with no hemoptysis.  She has no recent weight loss or night sweats.  She has no nausea, vomiting, diarrhea or constipation.  She has been tolerating her treatment with Keytruda fairly well.  She had repeat CT scan of the chest, abdomen pelvis performed recently and she is here for evaluation and discussion of her scan results.  MEDICAL HISTORY: Past Medical History:  Diagnosis Date  . Anxiety   . Diabetes mellitus without complication  ([place])   . Dyslipidemia   . HTN (hypertension)   . Hyperlipemia   . Hypothyroid   . Hypothyroidism   . Lung cancer ([place]) 2000   Carcinoid, s/p RLL lobectomy  . Obesity   . Ovarian cancer ([place])   . Palpitations 2009 and 2006   Negative nuclear stress test   . Sleep apnea    on C-pap    ALLERGIES:  is allergic to adhesive [tape]; codeine; lactose intolerance (gi); monosodium glutamate; and sulfa antibiotics.  MEDICATIONS:  Current Outpatient Medications  Medication Sig Dispense Refill  . acetaminophen (TYLENOL) 500 MG tablet Take 500 mg by mouth every 6 (six) hours as needed.    . benzonatate (TESSALON) 200 MG capsule Take 1 capsule (200 mg total) by mouth 3 (three) times daily as needed for cough. 30 capsule 1  . cholecalciferol (VITAMIN D3) 25 MCG (1000 UT) tablet Take 1,000 Units by mouth daily.    . clorazepate (TRANXENE) 7.5 MG tablet Take 7.5 mg by mouth 2 (two) times daily as needed for anxiety.    . docusate sodium (COLACE) 100 MG capsule Take 100 mg by mouth 2 (two) times daily.    . folic acid (FOLVITE) 1 MG tablet TAKE 1 TABLET BY MOUTH DAILY. 30 tablet 2  . guaiFENesin-codeine (CHERATUSSIN AC) 100-10 MG/5ML syrup Take 5 mLs by mouth every 6 (six) hours as needed for cough. 240 mL 0  . ipratropium-albuterol (DUONEB) 0.5-2.5 (3) MG/3ML SOLN INHALE THE CONTENTS OF 1 VIAL VIA NEBULIZER 3 TIMES A DAY. 120 mL 11  .  levothyroxine (SYNTHROID) 125 MCG tablet Take 125 mcg by mouth daily.    [name] lidocaine-prilocaine (EMLA) cream Apply 1 application topically as needed. 30 g 0  . metoprolol succinate (TOPROL-XL) 50 MG 24 hr tablet Take 50 mg by mouth 2 (two) times daily.     . Multiple Vitamins-Minerals (VISION FORMULA 2 PO) Take by mouth. OTC "Vision pills for Macular degeneration"    . prochlorperazine (COMPAZINE) 10 MG tablet Take 1 tablet (10 mg total) by mouth every 6 (six) hours as needed for nausea or vomiting. 30 tablet 0  . rosuvastatin (CRESTOR) 10 MG tablet Take 10 mg by mouth  every evening.     . SYMBICORT 160-4.5 MCG/ACT inhaler INHALE 2 PUFFS INTO THE LUNGS 2 TIMES DAILY. 10.2 g 6  . valACYclovir (VALTREX) 500 MG tablet Take 500 mg by mouth daily.    [name] 20 MG TABS tablet TAKE 1 TABLET BY MOUTH DAILY WITH SUPPER. 30 tablet 1   No current facility-administered medications for this visit.   Facility-Administered Medications Ordered in Other Visits  Medication Dose Route Frequency Provider Last Rate Last Admin  . sodium chloride flush (NS) 0.9 % injection 10 mL  10 mL Intracatheter PRN Curt Bears, MD   10 mL at 04/16/19 1016    SURGICAL HISTORY:  Past Surgical History:  Procedure Laterality Date  . ABDOMINAL HYSTERECTOMY  2001  . GRADED EXERCISE TOLERANCE TEST  08/2015    Blood pressure demonstrated a hypertensive response to exercise.  There was less than 0.64mm of horizontal ST segment depression in the inferolateral leads. No diagnostic criterial for ischemia. Moderately impaired exercise tolerance. The patient achieved 5.8 mets. This is a low risk study.  . IR FLUORO GUIDE PORT INSERTION RIGHT  08/17/2017  . IR THORACENTESIS ASP PLEURAL SPACE W/IMG GUIDE  07/04/2018  . IR US GUIDE VASC ACCESS RIGHT  08/17/2017  . LOBECTOMY  2000   RLL  . NM MYOCAR PERF WALL MOTION  08/20/2007   EF 74%  EXERCISE CAPACITY 7 METS  . OOPHORECTOMY  2001  . TONSILLECTOMY    . VIDEO BRONCHOSCOPY Bilateral 07/20/2017   Procedure: VIDEO BRONCHOSCOPY WITH FLUORO;  Surgeon: Collene Gobble, MD;  Location: [facility];  Service: Cardiopulmonary;  Laterality: Bilateral;    REVIEW OF SYSTEMS:  Constitutional: positive for fatigue Eyes: negative Ears, nose, mouth, throat, and face: negative Respiratory: positive for cough and dyspnea on exertion Cardiovascular: negative Gastrointestinal: negative Genitourinary:negative Integument/breast: negative Hematologic/lymphatic: negative Musculoskeletal:negative Neurological: negative Behavioral/Psych: negative Endocrine:  negative Allergic/Immunologic: negative   PHYSICAL EXAMINATION: General appearance: alert, cooperative, fatigued and no distress Head: Normocephalic, without obvious abnormality, atraumatic Neck: no adenopathy, no JVD, supple, symmetrical, trachea midline and thyroid not enlarged, symmetric, no tenderness/mass/nodules Lymph nodes: Cervical, supraclavicular, and axillary nodes normal. Resp: clear to auscultation bilaterally Back: symmetric, no curvature. ROM normal. No CVA tenderness. Cardio: regular rate and rhythm, S1, S2 normal, no murmur, click, rub or gallop GI: soft, non-tender; bowel sounds normal; no masses,  no organomegaly Extremities: extremities normal, atraumatic, no cyanosis or edema Neurologic: Alert and oriented X 3, normal strength and tone. Normal symmetric reflexes. Normal coordination and gait  ECOG PERFORMANCE STATUS: 1 - Symptomatic but completely ambulatory  Blood pressure (!) 159/56, pulse 88, temperature 98.2 F (36.8 C), temperature source Temporal, resp. rate 18, height 5\' 5"  (1.651 m), weight 175 lb 3.2 oz (79.5 kg), SpO2 93 %.  LABORATORY DATA: Lab Results  Component Value Date   WBC 10.1 03/26/2019   HGB  12.5 03/26/2019   HCT 39.6 03/26/2019   MCV 101.0 (H) 03/26/2019   PLT 221 03/26/2019      Chemistry      Component Value Date/Time   NA 144 03/26/2019 0908   K 4.2 03/26/2019 0908   CL 107 03/26/2019 0908   CO2 26 03/26/2019 0908   BUN 27 (H) 03/26/2019 0908   CREATININE 1.44 (H) 03/26/2019 0908      Component Value Date/Time   CALCIUM 8.9 03/26/2019 0908   ALKPHOS 74 03/26/2019 0908   AST 10 (L) 03/26/2019 0908   ALT <6 03/26/2019 0908   BILITOT 0.3 03/26/2019 0908       RADIOGRAPHIC STUDIES: CT Abdomen Pelvis Wo Contrast  Result Date: 04/15/2019 CLINICAL DATA:  Restaging non-small cell lung cancer. Chemotherapy completed March 2020, ongoing immunotherapy. EXAM: CT CHEST, ABDOMEN AND PELVIS WITHOUT CONTRAST TECHNIQUE: Multidetector CT  imaging of the chest, abdomen and pelvis was performed following the standard protocol without IV contrast. COMPARISON:  01/28/2019 FINDINGS: CT CHEST FINDINGS Cardiovascular: Right Port-A-Cath tip: Right atrium. Coronary, aortic arch, and branch vessel atherosclerotic vascular disease. Mediastinum/Nodes: Scattered small mediastinal lymph nodes similar to prior. Lungs/Pleura: Biapical pleuroparenchymal scarring. Bilateral scattered indistinctly marginated nodules both lungs favoring the lower lobes with associated confluent ground-glass opacities mostly in the lower lobes to a lesser extent posteriorly in the right upper lobe. There is continued posterior consolidation in the right lower lobe and some confluence of nodularity and to a lesser degree of consolidation in the left lower lobe, not changed from previous. Many of the nodules are sub solid. I do not see any specific progressive nodules but the appearance also has not improved. An index nodule posteriorly in the left upper lobe measures 0.8 by 0.6 cm on image 73/4, stable. Musculoskeletal: Thoracic spondylosis. CT ABDOMEN PELVIS FINDINGS Hepatobiliary: Small dependent hyperdensity in the gallbladder favoring small gallstones. No biliary dilatation. Pancreas: Unremarkable Spleen: Unremarkable Adrenals/Urinary Tract: Unremarkable Stomach/Bowel: Unremarkable Vascular/Lymphatic: Aortoiliac atherosclerotic vascular disease. Reproductive: Uterus absent.  Adnexa unremarkable. Other: No supplemental non-categorized findings. Musculoskeletal: Unremarkable IMPRESSION: 1. Stable appearance of indistinct nodularity, ground-glass opacities, and consolidation favoring the lung bases. The appearance remains abnormal but nonspecific in could be seen in the setting of chronic atypical infection, hypersensitivity pneumonitis, sarcoidosis, lymphangitic carcinomatosis, chronic eosinophilic pneumonia, or cryptogenic organizing pneumonia. Clearly, given the nodularity, the  possibility of some component of current metastatic disease involving the lungs is not readily excluded. 2.  Aortic Atherosclerosis (ICD10-I70.0).  Coronary atherosclerosis. 3. Cholelithiasis. Electronically Signed   By: Van Clines M.D.   On: 04/15/2019 16:16   CT Chest Wo Contrast  Result Date: 04/15/2019 CLINICAL DATA:  Restaging non-small cell lung cancer. Chemotherapy completed March 2020, ongoing immunotherapy. EXAM: CT CHEST, ABDOMEN AND PELVIS WITHOUT CONTRAST TECHNIQUE: Multidetector CT imaging of the chest, abdomen and pelvis was performed following the standard protocol without IV contrast. COMPARISON:  01/28/2019 FINDINGS: CT CHEST FINDINGS Cardiovascular: Right Port-A-Cath tip: Right atrium. Coronary, aortic arch, and branch vessel atherosclerotic vascular disease. Mediastinum/Nodes: Scattered small mediastinal lymph nodes similar to prior. Lungs/Pleura: Biapical pleuroparenchymal scarring. Bilateral scattered indistinctly marginated nodules both lungs favoring the lower lobes with associated confluent ground-glass opacities mostly in the lower lobes to a lesser extent posteriorly in the right upper lobe. There is continued posterior consolidation in the right lower lobe and some confluence of nodularity and to a lesser degree of consolidation in the left lower lobe, not changed from previous. Many of the nodules are sub solid. I do not see  any specific progressive nodules but the appearance also has not improved. An index nodule posteriorly in the left upper lobe measures 0.8 by 0.6 cm on image 73/4, stable. Musculoskeletal: Thoracic spondylosis. CT ABDOMEN PELVIS FINDINGS Hepatobiliary: Small dependent hyperdensity in the gallbladder favoring small gallstones. No biliary dilatation. Pancreas: Unremarkable Spleen: Unremarkable Adrenals/Urinary Tract: Unremarkable Stomach/Bowel: Unremarkable Vascular/Lymphatic: Aortoiliac atherosclerotic vascular disease. Reproductive: Uterus absent.  Adnexa  unremarkable. Other: No supplemental non-categorized findings. Musculoskeletal: Unremarkable IMPRESSION: 1. Stable appearance of indistinct nodularity, ground-glass opacities, and consolidation favoring the lung bases. The appearance remains abnormal but nonspecific in could be seen in the setting of chronic atypical infection, hypersensitivity pneumonitis, sarcoidosis, lymphangitic carcinomatosis, chronic eosinophilic pneumonia, or cryptogenic organizing pneumonia. Clearly, given the nodularity, the possibility of some component of current metastatic disease involving the lungs is not readily excluded. 2.  Aortic Atherosclerosis (ICD10-I70.0).  Coronary atherosclerosis. 3. Cholelithiasis. Electronically Signed   By: Van Clines M.D.   On: 04/15/2019 16:16     ASSESSMENT AND PLAN: This is a very pleasant 79 years old white female with stage IV non-small cell lung cancer, adenocarcinoma with no actionable mutations. She is currently undergoing systemic chemotherapy with carboplatin, Alimta and Ketruda (pembrolizumab) status post 15 cycles.  Starting from cycle #5 she is on maintenance treatment with Alimta and Ketruda (pembrolizumab). She has been in observation for several months with no treatment. Imaging studies showed evidence for disease progression. The patient resumed treatment with single agent Keytruda status post 3 cycles.  Her first cycle was started on February 12, 2019. The patient continues to tolerate this treatment well with no concerning adverse effects. She had repeat CT scan of the chest, abdomen pelvis performed recently.  I personally and independently reviewed the scans and discussed the results with the patient today.  Her scan showed no concerning findings for disease progression. I recommended for the patient to continue her current treatment and she will proceed with cycle #4 today. For the hypertension I strongly encouraged the patient to discuss her blood pressure  medication and management with her primary care physician. For the COPD and the inflammatory process in the lung she is followed by Dr. Valeta Harms. For the history of pulmonary embolism she will continue her current treatment with Xarelto. The patient will come back for follow-up visit in 3 weeks before the next cycle of her treatment. She was advised to call immediately if she has any concerning symptoms in the interval.  The patient voices understanding of current disease status and treatment options and is in agreement with the current care plan. All questions were answered. The patient knows to call the clinic with any problems, questions or concerns. We can certainly see the patient much sooner if necessary.  Disclaimer: This note was dictated with voice recognition software. Similar sounding words can inadvertently be transcribed and may not be corrected upon review.

## 2019-04-16 NOTE — Progress Notes (Signed)
At the end of Keytruda infusion, pt voiced that she was feeling discomfort in her bilateral hip/groin region, and stated that it felt similar to when she previously had a reaction to [place]. Since the infusion was complete, IV fluids were initiated to flush the line. Vital signs obtained and were WNL. After about 5 minutes, pt voiced that discomfort had improved. Sandi Mealy, PA was notified and came to assess pt. He advised to monitor patient for another 15 minutes prior to discharge. Pt discharged with resolution of symptoms in NAD.

## 2019-04-16 NOTE — Patient Instructions (Signed)
[facility] Discharge Instructions for Patients Receiving Chemotherapy  Today you received the following chemotherapy agents:  Keytruda.  To help prevent nausea and vomiting after your treatment, we encourage you to take your nausea medication as directed.   If you develop nausea and vomiting that is not controlled by your nausea medication, call the clinic.   BELOW ARE SYMPTOMS THAT SHOULD BE REPORTED IMMEDIATELY:  *FEVER GREATER THAN 100.5 F  *CHILLS WITH OR WITHOUT FEVER  NAUSEA AND VOMITING THAT IS NOT CONTROLLED WITH YOUR NAUSEA MEDICATION  *UNUSUAL SHORTNESS OF BREATH  *UNUSUAL BRUISING OR BLEEDING  TENDERNESS IN MOUTH AND THROAT WITH OR WITHOUT PRESENCE OF ULCERS  *URINARY PROBLEMS  *BOWEL PROBLEMS  UNUSUAL RASH Items with * indicate a potential emergency and should be followed up as soon as possible.  Feel free to call the clinic should you have any questions or concerns. The clinic phone number is [phone].  Please show the CHEMO ALERT CARD at check-in to the Emergency Department and triage nurse.    

## 2019-04-17 ENCOUNTER — Other Ambulatory Visit: Payer: Self-pay | Admitting: Internal Medicine

## 2019-04-17 DIAGNOSIS — I2699 Other pulmonary embolism without acute cor pulmonale: Secondary | ICD-10-CM

## 2019-04-18 NOTE — Progress Notes (Signed)
Erin Myers was seen in the infusion room today after she had completed her infusion of Keytruda.  She had been premedicated with Benadryl, Tylenol, and Pepcid.  She had reacted to a previous infusion of Keytruda.  She reported that her last infusion was completed without any ill effects.  Today she reported having bilateral inguinal pain at the end of her infusion similar to the pain that she had experienced with a prior infusion.  She was given additional IV fluids and was watched for 15 minutes without any additional issues of concern.  She was discharged home and was told that she could use p.o. Benadryl at home that evening should she have a recurrence of similar symptoms.  She expressed understanding and agreement with this plan.  Sandi Mealy, MHS, PA-C Physician Assistant

## 2019-04-23 ENCOUNTER — Ambulatory Visit: Payer: PPO | Attending: Internal Medicine

## 2019-04-23 DIAGNOSIS — Z23 Encounter for immunization: Secondary | ICD-10-CM | POA: Insufficient documentation

## 2019-04-23 NOTE — Progress Notes (Signed)
   Covid-19 Vaccination Clinic  Name:  Erin Myers    MRN: [id number] DOB: April 21, 1940  04/23/2019  [name] was observed post Covid-19 immunization for 15 minutes without incidence. She was provided with Vaccine Information Sheet and instruction to access the V-Safe system.   [name] was instructed to call 911 with any severe reactions post vaccine: [name] Difficulty breathing  . Swelling of your face and throat  . A fast heartbeat  . A bad rash all over your body  . Dizziness and weakness    Immunizations Administered    Name Date Dose VIS Date Route   Pfizer COVID-19 Vaccine 04/23/2019  9:51 AM 0.3 mL 02/22/2019 Intramuscular   Manufacturer: [place]   Lot: [id number]   [place]: [id number]

## 2019-05-06 NOTE — Progress Notes (Signed)
Erin Myers OFFICE PROGRESS NOTE  [name], MD [place] [address]  DIAGNOSIS:  1) Stage IV (T3, N0, M1 a)non-small cell lung cancer, well-differentiated adenocarcinoma presented with multifocal disease involving mainly the right upper lobe as well as the right middle lobe and left lower lobe of the lung diagnosed in May 2019. 2) bilateral small pulmonary emboli diagnosed incidentally on CT scan of the chest on October 16, 2017  Guardant 360 testing was negative for actionable mutations.  PRIOR THERAPY: None  CURRENT THERAPY:  1) Carboplatin for an AUC of 5, Alimta 500 mg/m2 and Keytruda 200 mg IV given every 3 weeks. First dosegiven on 08/18/2017.Starting maintenance alimta and Keytruda from cycle #5. She took a break after cycle #15 for several months. Status post 19cycles.  Starting from cycle #16, she resumed treatment with single agent Keytruda on 02/12/2019. 2) Xarelto 15 mg p.o. twice daily for the first 3 weeks followed by 20 mg p.o. Daily.  INTERVAL HISTORY: Erin Myers 79 y.o. female returns to the clinic for a follow-up visit.  The patient is feeling well today without any concerning complaints.  Since resuming single agent Keytruda in December 2020, the patient has had 2 adverse events while receiving treatment with Keytruda.  The patient states that on 02/12/2019 she developed discomfort which was localized bilaterally in the groin region. She describes that this discomfort travelled in a wave-like sensation upward cross her entire body with associated mild diaphoresis and flushing.  Pepcid was added to her treatment plan going forward.  On most recent cycle of treatment in February 2021, the patient was near completion of her infusion when she developed similar but more mild pain bilaterally in her groin that traveled down her legs.  She was given additional IV fluid and observed without any further issues or concerns.  She did not  develop any concerns upon returning home that evening.   Today, the patient is feeling fairly well.  She denies any recent fever, chills, night sweats, or weight loss.  She endorses her baseline shortness of breath for which she is on 4 L of home oxygen via nasal cannula.  She occasionally has nosebleeds secondary to nasal dryness from her oxygen and moisturizes her nose with ointment.  She denies any chest pain or hemoptysis.  She occasionally experiences an intermittent cough that comes and goes. No significant cough recently.  She denies any nausea, vomiting, diarrhea, or constipation.  She denies any headache or visual changes.  She has a mild nonpruritic rash above her eyebrows and on her right cheek for which she uses topical hydrocortisone cream.  She is here today for evaluation before starting cycle #20 of single agent Keytruda.    MEDICAL HISTORY: Past Medical History:  Diagnosis Date  . Anxiety   . Diabetes mellitus without complication ([place])   . Dyslipidemia   . HTN (hypertension)   . Hyperlipemia   . Hypothyroid   . Hypothyroidism   . Lung cancer ([place]) 2000   Carcinoid, s/p RLL lobectomy  . Obesity   . Ovarian cancer ([place])   . Palpitations 2009 and 2006   Negative nuclear stress test   . Sleep apnea    on C-pap    ALLERGIES:  is allergic to adhesive [tape]; codeine; lactose intolerance (gi); monosodium glutamate; and sulfa antibiotics.  MEDICATIONS:  Current Outpatient Medications  Medication Sig Dispense Refill  . acetaminophen (TYLENOL) 500 MG tablet Take 500 mg by mouth every 6 (six)  hours as needed.    . benzonatate (TESSALON) 200 MG capsule Take 1 capsule (200 mg total) by mouth 3 (three) times daily as needed for cough. (Patient not taking: Reported on 04/16/2019) 30 capsule 1  . cholecalciferol (VITAMIN D3) 25 MCG (1000 UT) tablet Take 1,000 Units by mouth daily.    . clorazepate (TRANXENE) 7.5 MG tablet Take 7.5 mg by mouth 2 (two) times daily as needed for  anxiety.    . docusate sodium (COLACE) 100 MG capsule Take 100 mg by mouth 2 (two) times daily.    [name] ipratropium-albuterol (DUONEB) 0.5-2.5 (3) MG/3ML SOLN INHALE THE CONTENTS OF 1 VIAL VIA NEBULIZER 3 TIMES A DAY. 120 mL 11  . levothyroxine (SYNTHROID) 125 MCG tablet Take 125 mcg by mouth daily.    [name] lidocaine-prilocaine (EMLA) cream Apply 1 application topically as needed. 30 g 0  . metoprolol succinate (TOPROL-XL) 50 MG 24 hr tablet Take 50 mg by mouth 2 (two) times daily.     . Multiple Vitamins-Minerals (VISION FORMULA 2 PO) Take by mouth. OTC "Vision pills for Macular degeneration"    . prochlorperazine (COMPAZINE) 10 MG tablet Take 1 tablet (10 mg total) by mouth every 6 (six) hours as needed for nausea or vomiting. (Patient not taking: Reported on 04/16/2019) 30 tablet 0  . rosuvastatin (CRESTOR) 10 MG tablet Take 10 mg by mouth every evening.     . SYMBICORT 160-4.5 MCG/ACT inhaler INHALE 2 PUFFS INTO THE LUNGS 2 TIMES DAILY. 10.2 g 6  . valACYclovir (VALTREX) 500 MG tablet Take 500 mg by mouth daily.    [name] 20 MG TABS tablet TAKE 1 TABLET BY MOUTH DAILY WITH SUPPER. 30 tablet 1   No current facility-administered medications for this visit.    SURGICAL HISTORY:  Past Surgical History:  Procedure Laterality Date  . ABDOMINAL HYSTERECTOMY  2001  . GRADED EXERCISE TOLERANCE TEST  08/2015    Blood pressure demonstrated a hypertensive response to exercise.  There was less than 0.2mm of horizontal ST segment depression in the inferolateral leads. No diagnostic criterial for ischemia. Moderately impaired exercise tolerance. The patient achieved 5.8 mets. This is a low risk study.  . IR FLUORO GUIDE PORT INSERTION RIGHT  08/17/2017  . IR THORACENTESIS ASP PLEURAL SPACE W/IMG GUIDE  07/04/2018  . IR US GUIDE VASC ACCESS RIGHT  08/17/2017  . LOBECTOMY  2000   RLL  . NM MYOCAR PERF WALL MOTION  08/20/2007   EF 74%  EXERCISE CAPACITY 7 METS  . OOPHORECTOMY  2001  . TONSILLECTOMY    . VIDEO  BRONCHOSCOPY Bilateral 07/20/2017   Procedure: VIDEO BRONCHOSCOPY WITH FLUORO;  Surgeon: Collene Gobble, MD;  Location: [facility];  Service: Cardiopulmonary;  Laterality: Bilateral;    REVIEW OF SYSTEMS:   Review of Systems  Constitutional: Positive for baseline fatigue. Negative for appetite change, chills, fever and unexpected weight change.  HENT: Positive for nose bleed ~1x per week. Negative for mouth sores, sore throat and trouble swallowing.   Eyes: Negative for eye problems and icterus.  Respiratory: Positive for baseline dyspnea on exertion (on 4L home oxygen). Negative for cough, hemoptysis, and wheezing.   Cardiovascular: Negative for chest pain and leg swelling.  Gastrointestinal: Negative for abdominal pain, constipation, diarrhea, nausea and vomiting.  Genitourinary: Negative for bladder incontinence, difficulty urinating, dysuria, frequency and hematuria.   Musculoskeletal: Negative for back pain, gait problem, neck pain and neck stiffness.  Skin: Positive for rash above eyebrows.   Neurological:  Negative for dizziness, extremity weakness, gait problem, headaches, light-headedness and seizures.  Hematological: Negative for adenopathy. Does not bruise/bleed easily.  Psychiatric/Behavioral: Negative for confusion, depression and sleep disturbance. The patient is not nervous/anxious.     PHYSICAL EXAMINATION:  There were no vitals taken for this visit.  ECOG PERFORMANCE STATUS: 1 - Symptomatic but completely ambulatory  Physical Exam  Constitutional: Oriented to person, place, and time and well-developed, well-nourished, and in no distress.  HENT:  Head: Normocephalic and atraumatic.  Mouth/Throat: Oropharynx is clear and moist. No oropharyngeal exudate.  Eyes: Conjunctivae are normal. Right eye exhibits no discharge. Left eye exhibits no discharge. No scleral icterus.  Neck: Normal range of motion. Neck supple.  Cardiovascular: Normal rate, regular rhythm, normal heart  sounds and intact distal pulses.   Pulmonary/Chest: Effort normal and breath sounds normal. No respiratory distress. No wheezes. No rales. 4 L oxygen via nasal cannula. Abdominal: Soft. Bowel sounds are normal. Exhibits no distension and no mass. There is no tenderness.  Musculoskeletal: Normal range of motion. Exhibits no edema.  Lymphadenopathy:    No cervical adenopathy.  Neurological: Alert and oriented to person, place, and time. Exhibits normal muscle tone. Gait normal. Coordination normal.  Skin: Skin is warm and dry. No rash noted. Not diaphoretic. No erythema. No pallor.  Psychiatric: Mood, memory and judgment normal.  Vitals reviewed.  LABORATORY DATA: Lab Results  Component Value Date   WBC 9.8 04/16/2019   HGB 12.7 04/16/2019   HCT 41.3 04/16/2019   MCV 103.0 (H) 04/16/2019   PLT 206 04/16/2019      Chemistry      Component Value Date/Time   NA 144 04/16/2019 1025   K 4.2 04/16/2019 1025   CL 105 04/16/2019 1025   CO2 29 04/16/2019 1025   BUN 22 04/16/2019 1025   CREATININE 1.36 (H) 04/16/2019 1025      Component Value Date/Time   CALCIUM 9.0 04/16/2019 1025   ALKPHOS 85 04/16/2019 1025   AST 10 (L) 04/16/2019 1025   ALT 8 04/16/2019 1025   BILITOT 0.3 04/16/2019 1025       RADIOGRAPHIC STUDIES:  CT Abdomen Pelvis Wo Contrast  Result Date: 04/15/2019 CLINICAL DATA:  Restaging non-small cell lung cancer. Chemotherapy completed March 2020, ongoing immunotherapy. EXAM: CT CHEST, ABDOMEN AND PELVIS WITHOUT CONTRAST TECHNIQUE: Multidetector CT imaging of the chest, abdomen and pelvis was performed following the standard protocol without IV contrast. COMPARISON:  01/28/2019 FINDINGS: CT CHEST FINDINGS Cardiovascular: Right Port-A-Cath tip: Right atrium. Coronary, aortic arch, and branch vessel atherosclerotic vascular disease. Mediastinum/Nodes: Scattered small mediastinal lymph nodes similar to prior. Lungs/Pleura: Biapical pleuroparenchymal scarring. Bilateral  scattered indistinctly marginated nodules both lungs favoring the lower lobes with associated confluent ground-glass opacities mostly in the lower lobes to a lesser extent posteriorly in the right upper lobe. There is continued posterior consolidation in the right lower lobe and some confluence of nodularity and to a lesser degree of consolidation in the left lower lobe, not changed from previous. Many of the nodules are sub solid. I do not see any specific progressive nodules but the appearance also has not improved. An index nodule posteriorly in the left upper lobe measures 0.8 by 0.6 cm on image 73/4, stable. Musculoskeletal: Thoracic spondylosis. CT ABDOMEN PELVIS FINDINGS Hepatobiliary: Small dependent hyperdensity in the gallbladder favoring small gallstones. No biliary dilatation. Pancreas: Unremarkable Spleen: Unremarkable Adrenals/Urinary Tract: Unremarkable Stomach/Bowel: Unremarkable Vascular/Lymphatic: Aortoiliac atherosclerotic vascular disease. Reproductive: Uterus absent.  Adnexa unremarkable. Other: No supplemental non-categorized findings.  Musculoskeletal: Unremarkable IMPRESSION: 1. Stable appearance of indistinct nodularity, ground-glass opacities, and consolidation favoring the lung bases. The appearance remains abnormal but nonspecific in could be seen in the setting of chronic atypical infection, hypersensitivity pneumonitis, sarcoidosis, lymphangitic carcinomatosis, chronic eosinophilic pneumonia, or cryptogenic organizing pneumonia. Clearly, given the nodularity, the possibility of some component of current metastatic disease involving the lungs is not readily excluded. 2.  Aortic Atherosclerosis (ICD10-I70.0).  Coronary atherosclerosis. 3. Cholelithiasis. Electronically Signed   By: Van Clines M.D.   On: 04/15/2019 16:16   CT Chest Wo Contrast  Result Date: 04/15/2019 CLINICAL DATA:  Restaging non-small cell lung cancer. Chemotherapy completed March 2020, ongoing immunotherapy.  EXAM: CT CHEST, ABDOMEN AND PELVIS WITHOUT CONTRAST TECHNIQUE: Multidetector CT imaging of the chest, abdomen and pelvis was performed following the standard protocol without IV contrast. COMPARISON:  01/28/2019 FINDINGS: CT CHEST FINDINGS Cardiovascular: Right Port-A-Cath tip: Right atrium. Coronary, aortic arch, and branch vessel atherosclerotic vascular disease. Mediastinum/Nodes: Scattered small mediastinal lymph nodes similar to prior. Lungs/Pleura: Biapical pleuroparenchymal scarring. Bilateral scattered indistinctly marginated nodules both lungs favoring the lower lobes with associated confluent ground-glass opacities mostly in the lower lobes to a lesser extent posteriorly in the right upper lobe. There is continued posterior consolidation in the right lower lobe and some confluence of nodularity and to a lesser degree of consolidation in the left lower lobe, not changed from previous. Many of the nodules are sub solid. I do not see any specific progressive nodules but the appearance also has not improved. An index nodule posteriorly in the left upper lobe measures 0.8 by 0.6 cm on image 73/4, stable. Musculoskeletal: Thoracic spondylosis. CT ABDOMEN PELVIS FINDINGS Hepatobiliary: Small dependent hyperdensity in the gallbladder favoring small gallstones. No biliary dilatation. Pancreas: Unremarkable Spleen: Unremarkable Adrenals/Urinary Tract: Unremarkable Stomach/Bowel: Unremarkable Vascular/Lymphatic: Aortoiliac atherosclerotic vascular disease. Reproductive: Uterus absent.  Adnexa unremarkable. Other: No supplemental non-categorized findings. Musculoskeletal: Unremarkable IMPRESSION: 1. Stable appearance of indistinct nodularity, ground-glass opacities, and consolidation favoring the lung bases. The appearance remains abnormal but nonspecific in could be seen in the setting of chronic atypical infection, hypersensitivity pneumonitis, sarcoidosis, lymphangitic carcinomatosis, chronic eosinophilic  pneumonia, or cryptogenic organizing pneumonia. Clearly, given the nodularity, the possibility of some component of current metastatic disease involving the lungs is not readily excluded. 2.  Aortic Atherosclerosis (ICD10-I70.0).  Coronary atherosclerosis. 3. Cholelithiasis. Electronically Signed   By: Van Clines M.D.   On: 04/15/2019 16:16     ASSESSMENT/PLAN:  This is a very pleasant 79 year old Caucasian female diagnosed with stage IV non-small cell lung cancer, well differentiated adenocarcinoma.  She presented with multifocal disease involving mainly the right upper lobe as well as right middle lobe and left lower lobe.  She was diagnosed in May 2019.  She has no actionable mutations.  She was undergoing treatment with carboplatin, Alimta, and Keytruda.  Starting from cycle #5 she started treatment with maintenance Alimta and Keytruda.  She requested to take a break after cycle #15.  She resumed her treatment with single agent Keytruda on December 2020 with cycle #16.  She is currently status post 19 cycles of treatment.  Pepcid was added to her treatment plan due to the 2 episodes of adverse reactions during treatment with Keytruda.  The patient was seen with Dr. [name] today.  Labs were reviewed.  Her labs are acceptable to proceed with cycle #20 today.  Dr. [name] had a lengthy discussion with the patient about her treatment.  Dr. [name] recommends that the patient undergo cycle #20  today and to continue her premedications with Benadryl, Tylenol, and Pepcid.  We will continue to monitor her closely in the duration of her infusion.  If she is to develop any reactions during infusion, Dr. [name] may consider additional premedications or, if necessary, changes in her treatment plan.  We will see her back for follow-up visit in 3 weeks for evaluation before starting cycle #21.  The patient will continue taking Xarelto for her history of pulmonary embolism  The patient was advised to  call immediately if she has any concerning symptoms in the interval. The patient voices understanding of current disease status and treatment options and is in agreement with the current care plan. All questions were answered. The patient knows to call the clinic with any problems, questions or concerns. We can certainly see the patient much sooner if necessary     No orders of the defined types were placed in this encounter.     L , PA-C 05/06/19  ADDENDUM: Hematology/Oncology Attending: I had a face-to-face encounter with the patient today.  I recommended her care plan.  This is a very pleasant 79 years old white female with metastatic non-small cell lung cancer, adenocarcinoma status post induction systemic chemotherapy with carboplatin, Alimta and Keytruda.  She is currently on treatment with single agent Keytruda as maintenance therapy and she has been tolerating the treatment well except for some hypersensitivity reaction the last 2 cycles but resolved with premedication. I recommended for the patient to proceed with cycle #20 today as planned. For the pulmonary embolism she will continue her current treatment with Xarelto. The patient will come back for follow-up visit in 3 weeks for evaluation before the next cycle of her treatment. The patient was advised to call immediately if she has any concerning symptoms in the interval.  Disclaimer: This note was dictated with voice recognition software. Similar sounding words can inadvertently be transcribed and may be missed upon review. Eilleen Kempf, MD 05/07/19

## 2019-05-07 ENCOUNTER — Inpatient Hospital Stay (HOSPITAL_BASED_OUTPATIENT_CLINIC_OR_DEPARTMENT_OTHER): Payer: PPO | Admitting: Physician Assistant

## 2019-05-07 ENCOUNTER — Inpatient Hospital Stay: Payer: PPO

## 2019-05-07 ENCOUNTER — Other Ambulatory Visit: Payer: Self-pay

## 2019-05-07 ENCOUNTER — Encounter: Payer: Self-pay | Admitting: Physician Assistant

## 2019-05-07 VITALS — BP 148/66 | HR 80 | Temp 98.7°F | Resp 18 | Ht 65.0 in | Wt 174.9 lb

## 2019-05-07 DIAGNOSIS — Z5112 Encounter for antineoplastic immunotherapy: Secondary | ICD-10-CM

## 2019-05-07 DIAGNOSIS — C3491 Malignant neoplasm of unspecified part of right bronchus or lung: Secondary | ICD-10-CM

## 2019-05-07 DIAGNOSIS — Z95828 Presence of other vascular implants and grafts: Secondary | ICD-10-CM

## 2019-05-07 LAB — CBC WITH DIFFERENTIAL (CANCER CENTER ONLY)
Abs Immature Granulocytes: 0.05 10*3/uL (ref 0.00–0.07)
Basophils Absolute: 0.1 10*3/uL (ref 0.0–0.1)
Basophils Relative: 0 %
Eosinophils Absolute: 0.3 10*3/uL (ref 0.0–0.5)
Eosinophils Relative: 3 %
HCT: 41.9 % (ref 36.0–46.0)
Hemoglobin: 13 g/dL (ref 12.0–15.0)
Immature Granulocytes: 0 %
Lymphocytes Relative: 15 %
Lymphs Abs: 1.6 10*3/uL (ref 0.7–4.0)
MCH: 31.8 pg (ref 26.0–34.0)
MCHC: 31 g/dL (ref 30.0–36.0)
MCV: 102.4 fL — ABNORMAL HIGH (ref 80.0–100.0)
Monocytes Absolute: 0.9 10*3/uL (ref 0.1–1.0)
Monocytes Relative: 8 %
Neutro Abs: 8.3 10*3/uL — ABNORMAL HIGH (ref 1.7–7.7)
Neutrophils Relative %: 74 %
Platelet Count: 203 10*3/uL (ref 150–400)
RBC: 4.09 MIL/uL (ref 3.87–5.11)
RDW: 13.5 % (ref 11.5–15.5)
WBC Count: 11.2 10*3/uL — ABNORMAL HIGH (ref 4.0–10.5)
nRBC: 0 % (ref 0.0–0.2)

## 2019-05-07 LAB — TSH: TSH: 0.729 u[IU]/mL (ref 0.308–3.960)

## 2019-05-07 LAB — CMP (CANCER CENTER ONLY)
ALT: 6 U/L (ref 0–44)
AST: 10 U/L — ABNORMAL LOW (ref 15–41)
Albumin: 3.3 g/dL — ABNORMAL LOW (ref 3.5–5.0)
Alkaline Phosphatase: 78 U/L (ref 38–126)
Anion gap: 9 (ref 5–15)
BUN: 24 mg/dL — ABNORMAL HIGH (ref 8–23)
CO2: 30 mmol/L (ref 22–32)
Calcium: 9.1 mg/dL (ref 8.9–10.3)
Chloride: 105 mmol/L (ref 98–111)
Creatinine: 1.29 mg/dL — ABNORMAL HIGH (ref 0.44–1.00)
GFR, Est AFR Am: 47 mL/min — ABNORMAL LOW (ref >60)
GFR, Estimated: 40 mL/min — ABNORMAL LOW (ref >60)
Glucose, Bld: 122 mg/dL — ABNORMAL HIGH (ref 70–99)
Potassium: 4.4 mmol/L (ref 3.5–5.1)
Sodium: 144 mmol/L (ref 135–145)
Total Bilirubin: 0.3 mg/dL (ref 0.3–1.2)
Total Protein: 7.3 g/dL (ref 6.5–8.1)

## 2019-05-07 MED ORDER — SODIUM CHLORIDE 0.9% FLUSH
10.0000 mL | INTRAVENOUS | Status: DC | PRN
  Administered 2019-05-07: 10 mL
  Filled 2019-05-07: qty 10

## 2019-05-07 MED ORDER — ACETAMINOPHEN 325 MG PO TABS
ORAL_TABLET | ORAL | Status: AC
  Filled 2019-05-07: qty 2

## 2019-05-07 MED ORDER — SODIUM CHLORIDE 0.9 % IV SOLN
200.0000 mg | Freq: Once | INTRAVENOUS | Status: AC
  Administered 2019-05-07: 200 mg via INTRAVENOUS
  Filled 2019-05-07: qty 8

## 2019-05-07 MED ORDER — FAMOTIDINE IN NACL 20-0.9 MG/50ML-% IV SOLN
20.0000 mg | Freq: Once | INTRAVENOUS | Status: AC
  Administered 2019-05-07: 20 mg via INTRAVENOUS

## 2019-05-07 MED ORDER — DIPHENHYDRAMINE HCL 50 MG/ML IJ SOLN
25.0000 mg | Freq: Once | INTRAMUSCULAR | Status: AC
  Administered 2019-05-07: 25 mg via INTRAVENOUS

## 2019-05-07 MED ORDER — SODIUM CHLORIDE 0.9 % IV SOLN
Freq: Once | INTRAVENOUS | Status: AC
  Filled 2019-05-07: qty 250

## 2019-05-07 MED ORDER — DIPHENHYDRAMINE HCL 50 MG/ML IJ SOLN
INTRAMUSCULAR | Status: AC
  Filled 2019-05-07: qty 1

## 2019-05-07 MED ORDER — FAMOTIDINE IN NACL 20-0.9 MG/50ML-% IV SOLN
INTRAVENOUS | Status: AC
  Filled 2019-05-07: qty 50

## 2019-05-07 MED ORDER — HEPARIN SOD (PORK) LOCK FLUSH 100 UNIT/ML IV SOLN
500.0000 [IU] | Freq: Once | INTRAVENOUS | Status: AC | PRN
  Administered 2019-05-07: 500 [IU]
  Filled 2019-05-07: qty 5

## 2019-05-07 MED ORDER — SODIUM CHLORIDE 0.9% FLUSH
10.0000 mL | INTRAVENOUS | Status: DC | PRN
  Administered 2019-05-07: 14:00:00 10 mL
  Filled 2019-05-07: qty 10

## 2019-05-07 MED ORDER — ACETAMINOPHEN 325 MG PO TABS
650.0000 mg | ORAL_TABLET | Freq: Once | ORAL | Status: AC
  Administered 2019-05-07: 650 mg via ORAL

## 2019-05-08 ENCOUNTER — Telehealth: Payer: Self-pay | Admitting: Internal Medicine

## 2019-05-08 NOTE — Telephone Encounter (Signed)
Scheduled per los. Called and left msg. Mailed printout  °

## 2019-05-10 DIAGNOSIS — J449 Chronic obstructive pulmonary disease, unspecified: Secondary | ICD-10-CM | POA: Diagnosis not present

## 2019-05-28 ENCOUNTER — Inpatient Hospital Stay: Payer: PPO | Attending: Internal Medicine | Admitting: Internal Medicine

## 2019-05-28 ENCOUNTER — Inpatient Hospital Stay: Payer: PPO

## 2019-05-28 ENCOUNTER — Encounter: Payer: Self-pay | Admitting: *Deleted

## 2019-05-28 ENCOUNTER — Other Ambulatory Visit: Payer: Self-pay

## 2019-05-28 ENCOUNTER — Encounter: Payer: Self-pay | Admitting: Internal Medicine

## 2019-05-28 VITALS — BP 143/66 | HR 71 | Temp 98.2°F | Resp 17 | Ht 65.0 in | Wt 174.0 lb

## 2019-05-28 DIAGNOSIS — E119 Type 2 diabetes mellitus without complications: Secondary | ICD-10-CM | POA: Insufficient documentation

## 2019-05-28 DIAGNOSIS — I2699 Other pulmonary embolism without acute cor pulmonale: Secondary | ICD-10-CM

## 2019-05-28 DIAGNOSIS — E785 Hyperlipidemia, unspecified: Secondary | ICD-10-CM | POA: Diagnosis not present

## 2019-05-28 DIAGNOSIS — Z95828 Presence of other vascular implants and grafts: Secondary | ICD-10-CM

## 2019-05-28 DIAGNOSIS — Z7901 Long term (current) use of anticoagulants: Secondary | ICD-10-CM | POA: Diagnosis not present

## 2019-05-28 DIAGNOSIS — J449 Chronic obstructive pulmonary disease, unspecified: Secondary | ICD-10-CM | POA: Diagnosis not present

## 2019-05-28 DIAGNOSIS — C3491 Malignant neoplasm of unspecified part of right bronchus or lung: Secondary | ICD-10-CM

## 2019-05-28 DIAGNOSIS — I10 Essential (primary) hypertension: Secondary | ICD-10-CM | POA: Diagnosis not present

## 2019-05-28 DIAGNOSIS — Z79899 Other long term (current) drug therapy: Secondary | ICD-10-CM | POA: Insufficient documentation

## 2019-05-28 DIAGNOSIS — C3481 Malignant neoplasm of overlapping sites of right bronchus and lung: Secondary | ICD-10-CM | POA: Diagnosis not present

## 2019-05-28 DIAGNOSIS — G4730 Sleep apnea, unspecified: Secondary | ICD-10-CM | POA: Diagnosis not present

## 2019-05-28 DIAGNOSIS — E039 Hypothyroidism, unspecified: Secondary | ICD-10-CM | POA: Diagnosis not present

## 2019-05-28 DIAGNOSIS — F419 Anxiety disorder, unspecified: Secondary | ICD-10-CM | POA: Diagnosis not present

## 2019-05-28 DIAGNOSIS — E669 Obesity, unspecified: Secondary | ICD-10-CM | POA: Diagnosis not present

## 2019-05-28 DIAGNOSIS — Z5112 Encounter for antineoplastic immunotherapy: Secondary | ICD-10-CM | POA: Diagnosis not present

## 2019-05-28 LAB — CBC WITH DIFFERENTIAL (CANCER CENTER ONLY)
Abs Immature Granulocytes: 0.02 10*3/uL (ref 0.00–0.07)
Basophils Absolute: 0.1 10*3/uL (ref 0.0–0.1)
Basophils Relative: 1 %
Eosinophils Absolute: 0.3 10*3/uL (ref 0.0–0.5)
Eosinophils Relative: 3 %
HCT: 40.6 % (ref 36.0–46.0)
Hemoglobin: 12.8 g/dL (ref 12.0–15.0)
Immature Granulocytes: 0 %
Lymphocytes Relative: 15 %
Lymphs Abs: 1.5 10*3/uL (ref 0.7–4.0)
MCH: 31.5 pg (ref 26.0–34.0)
MCHC: 31.5 g/dL (ref 30.0–36.0)
MCV: 100 fL (ref 80.0–100.0)
Monocytes Absolute: 0.7 10*3/uL (ref 0.1–1.0)
Monocytes Relative: 7 %
Neutro Abs: 7.7 10*3/uL (ref 1.7–7.7)
Neutrophils Relative %: 74 %
Platelet Count: 198 10*3/uL (ref 150–400)
RBC: 4.06 MIL/uL (ref 3.87–5.11)
RDW: 13.5 % (ref 11.5–15.5)
WBC Count: 10.4 10*3/uL (ref 4.0–10.5)
nRBC: 0 % (ref 0.0–0.2)

## 2019-05-28 LAB — CMP (CANCER CENTER ONLY)
ALT: <6 U/L (ref 0–44)
AST: 11 U/L — ABNORMAL LOW (ref 15–41)
Albumin: 3.1 g/dL — ABNORMAL LOW (ref 3.5–5.0)
Alkaline Phosphatase: 75 U/L (ref 38–126)
Anion gap: 8 (ref 5–15)
BUN: 23 mg/dL (ref 8–23)
CO2: 29 mmol/L (ref 22–32)
Calcium: 9 mg/dL (ref 8.9–10.3)
Chloride: 105 mmol/L (ref 98–111)
Creatinine: 1.2 mg/dL — ABNORMAL HIGH (ref 0.44–1.00)
GFR, Est AFR Am: 51 mL/min — ABNORMAL LOW (ref >60)
GFR, Estimated: 44 mL/min — ABNORMAL LOW (ref >60)
Glucose, Bld: 100 mg/dL — ABNORMAL HIGH (ref 70–99)
Potassium: 4.3 mmol/L (ref 3.5–5.1)
Sodium: 142 mmol/L (ref 135–145)
Total Bilirubin: 0.3 mg/dL (ref 0.3–1.2)
Total Protein: 7.1 g/dL (ref 6.5–8.1)

## 2019-05-28 LAB — TSH: TSH: 1.724 u[IU]/mL (ref 0.308–3.960)

## 2019-05-28 MED ORDER — SODIUM CHLORIDE 0.9% FLUSH
10.0000 mL | INTRAVENOUS | Status: DC | PRN
  Administered 2019-05-28: 12:00:00 10 mL
  Filled 2019-05-28: qty 10

## 2019-05-28 MED ORDER — DIPHENHYDRAMINE HCL 50 MG/ML IJ SOLN
INTRAMUSCULAR | Status: AC
  Filled 2019-05-28: qty 1

## 2019-05-28 MED ORDER — ACETAMINOPHEN 325 MG PO TABS
ORAL_TABLET | ORAL | Status: AC
  Filled 2019-05-28: qty 2

## 2019-05-28 MED ORDER — ACETAMINOPHEN 325 MG PO TABS
650.0000 mg | ORAL_TABLET | Freq: Once | ORAL | Status: AC
  Administered 2019-05-28: 650 mg via ORAL

## 2019-05-28 MED ORDER — SODIUM CHLORIDE 0.9% FLUSH
10.0000 mL | INTRAVENOUS | Status: DC | PRN
  Administered 2019-05-28: 10 mL
  Filled 2019-05-28: qty 10

## 2019-05-28 MED ORDER — FAMOTIDINE IN NACL 20-0.9 MG/50ML-% IV SOLN
20.0000 mg | Freq: Once | INTRAVENOUS | Status: AC
  Administered 2019-05-28: 11:00:00 20 mg via INTRAVENOUS

## 2019-05-28 MED ORDER — SODIUM CHLORIDE 0.9 % IV SOLN
200.0000 mg | Freq: Once | INTRAVENOUS | Status: AC
  Administered 2019-05-28: 200 mg via INTRAVENOUS
  Filled 2019-05-28: qty 8

## 2019-05-28 MED ORDER — DIPHENHYDRAMINE HCL 50 MG/ML IJ SOLN
25.0000 mg | Freq: Once | INTRAMUSCULAR | Status: AC
  Administered 2019-05-28: 11:00:00 25 mg via INTRAVENOUS

## 2019-05-28 MED ORDER — SODIUM CHLORIDE 0.9 % IV SOLN
Freq: Once | INTRAVENOUS | Status: AC
  Filled 2019-05-28: qty 250

## 2019-05-28 MED ORDER — FAMOTIDINE IN NACL 20-0.9 MG/50ML-% IV SOLN
INTRAVENOUS | Status: AC
  Filled 2019-05-28: qty 50

## 2019-05-28 MED ORDER — HEPARIN SOD (PORK) LOCK FLUSH 100 UNIT/ML IV SOLN
500.0000 [IU] | Freq: Once | INTRAVENOUS | Status: AC | PRN
  Administered 2019-05-28: 12:00:00 500 [IU]
  Filled 2019-05-28: qty 5

## 2019-05-28 NOTE — Progress Notes (Signed)
Oncology Nurse Navigator Documentation  Oncology Nurse Navigator Flowsheets 05/28/2019  Abnormal Finding Date -  Confirmed Diagnosis Date -  Navigator Location [facility]  Referral Date to RadOnc/MedOnc -  Navigator Encounter Type Clinic/MDC/I spoke with Erin Myers today.  She is doing well with a complaint for nasal congestion due to allergies.  Dr. [name] update.  No barriers observed at this time.   Treatment Initiated Date -  Patient Visit Type MedOnc  Treatment Phase Treatment  Barriers/Navigation Needs No Barriers At This Time  Education -  Interventions Psycho-Social Support  Acuity Level 1-No Barriers  Coordination of Care -  Education Method -  Time Spent with Patient 15

## 2019-05-28 NOTE — Patient Instructions (Signed)
[facility] Discharge Instructions for Patients Receiving Chemotherapy  Today you received the following chemotherapy agents:  Keytruda.  To help prevent nausea and vomiting after your treatment, we encourage you to take your nausea medication as directed.   If you develop nausea and vomiting that is not controlled by your nausea medication, call the clinic.   BELOW ARE SYMPTOMS THAT SHOULD BE REPORTED IMMEDIATELY:  *FEVER GREATER THAN 100.5 F  *CHILLS WITH OR WITHOUT FEVER  NAUSEA AND VOMITING THAT IS NOT CONTROLLED WITH YOUR NAUSEA MEDICATION  *UNUSUAL SHORTNESS OF BREATH  *UNUSUAL BRUISING OR BLEEDING  TENDERNESS IN MOUTH AND THROAT WITH OR WITHOUT PRESENCE OF ULCERS  *URINARY PROBLEMS  *BOWEL PROBLEMS  UNUSUAL RASH Items with * indicate a potential emergency and should be followed up as soon as possible.  Feel free to call the clinic should you have any questions or concerns. The clinic phone number is [phone].  Please show the CHEMO ALERT CARD at check-in to the Emergency Department and triage nurse.    

## 2019-05-28 NOTE — Patient Instructions (Signed)

## 2019-05-28 NOTE — Progress Notes (Signed)
[facility] Telephone:([phone]) [phone]   Fax:([phone]) [phone]  OFFICE PROGRESS NOTE  [name], MD [place] [address]  DIAGNOSIS:  1) Stage IV (T3, N0, M1 a)non-small cell lung cancer, well-differentiated adenocarcinoma presented with multifocal disease involving mainly the right upper lobe as well as the right middle lobe and left lower lobe of the lung diagnosed in May 2019. 2) bilateral small pulmonary emboli diagnosed incidentally on CT scan of the chest on October 16, 2017  Guardant 360 testing was negative for actionable mutations.  PRIOR THERAPY:None  CURRENT THERAPY: 1) Carboplatin for an AUC of 5, Alimta 500 mg/m2 and Keytruda 200 mg IV given every 3 weeks. First dosegiven on 08/18/2017.Status post 20 cycles.  Starting from cycle #5 the patient is on maintenance Alimta and Ketruda (pembrolizumab) every 3 weeks.  Her treatment has been on hold for several months. She will start treatment with single agent Keytruda on February 12, 2019. 2) Xarelto 15 mg p.o. twice daily for the first 3 weeks followed by 20 mg p.o. Daily.  INTERVAL HISTORY: Erin Myers 79 y.o. female returns to the clinic today for follow-up visit.  The patient is feeling fine today with no concerning complaints except for the baseline shortness of breath increased with the seasonal allergy.  She has an appointment with her pulmonologist soon for evaluation of the seasonal allergy.  She denied having any chest pain, cough or hemoptysis.  She denied having any fever or chills.  She has no nausea, vomiting, diarrhea or constipation.  She denied having any headache or visual changes.  She continues to tolerate her treatment with Keytruda fairly well.  MEDICAL HISTORY: Past Medical History:  Diagnosis Date  . Anxiety   . Diabetes mellitus without complication ([place])   . Dyslipidemia   . HTN (hypertension)   . Hyperlipemia   . Hypothyroid   . Hypothyroidism    . Lung cancer ([place]) 2000   Carcinoid, s/p RLL lobectomy  . Obesity   . Ovarian cancer ([place])   . Palpitations 2009 and 2006   Negative nuclear stress test   . Sleep apnea    on C-pap    ALLERGIES:  is allergic to pembrolizumab; adhesive [tape]; codeine; lactose intolerance (gi); monosodium glutamate; and sulfa antibiotics.  MEDICATIONS:  Current Outpatient Medications  Medication Sig Dispense Refill  . acetaminophen (TYLENOL) 500 MG tablet Take 500 mg by mouth every 6 (six) hours as needed.    . benzonatate (TESSALON) 200 MG capsule Take 1 capsule (200 mg total) by mouth 3 (three) times daily as needed for cough. 30 capsule 1  . cholecalciferol (VITAMIN D3) 25 MCG (1000 UT) tablet Take 1,000 Units by mouth daily.    . clorazepate (TRANXENE) 7.5 MG tablet Take 7.5 mg by mouth 2 (two) times daily as needed for anxiety.    . docusate sodium (COLACE) 100 MG capsule Take 100 mg by mouth 2 (two) times daily.    [name] ipratropium-albuterol (DUONEB) 0.5-2.5 (3) MG/3ML SOLN INHALE THE CONTENTS OF 1 VIAL VIA NEBULIZER 3 TIMES A DAY. 120 mL 11  . levothyroxine (SYNTHROID) 112 MCG tablet Take 112 mcg by mouth daily.    [name] lidocaine-prilocaine (EMLA) cream Apply 1 application topically as needed. 30 g 0  . metoprolol succinate (TOPROL-XL) 50 MG 24 hr tablet Take 50 mg by mouth 2 (two) times daily.     . Multiple Vitamins-Minerals (VISION FORMULA 2 PO) Take by mouth. OTC "Vision pills for  Macular degeneration"    . rosuvastatin (CRESTOR) 10 MG tablet Take 10 mg by mouth every evening.     . SYMBICORT 160-4.5 MCG/ACT inhaler INHALE 2 PUFFS INTO THE LUNGS 2 TIMES DAILY. 10.2 g 6  . valACYclovir (VALTREX) 500 MG tablet Take 500 mg by mouth daily.    [name] 20 MG TABS tablet TAKE 1 TABLET BY MOUTH DAILY WITH SUPPER. 30 tablet 1  . prochlorperazine (COMPAZINE) 10 MG tablet Take 1 tablet (10 mg total) by mouth every 6 (six) hours as needed for nausea or vomiting. (Patient not taking: Reported on 04/16/2019) 30  tablet 0   No current facility-administered medications for this visit.    SURGICAL HISTORY:  Past Surgical History:  Procedure Laterality Date  . ABDOMINAL HYSTERECTOMY  2001  . GRADED EXERCISE TOLERANCE TEST  08/2015    Blood pressure demonstrated a hypertensive response to exercise.  There was less than 0.50mm of horizontal ST segment depression in the inferolateral leads. No diagnostic criterial for ischemia. Moderately impaired exercise tolerance. The patient achieved 5.8 mets. This is a low risk study.  . IR FLUORO GUIDE PORT INSERTION RIGHT  08/17/2017  . IR THORACENTESIS ASP PLEURAL SPACE W/IMG GUIDE  07/04/2018  . IR US GUIDE VASC ACCESS RIGHT  08/17/2017  . LOBECTOMY  2000   RLL  . NM MYOCAR PERF WALL MOTION  08/20/2007   EF 74%  EXERCISE CAPACITY 7 METS  . OOPHORECTOMY  2001  . TONSILLECTOMY    . VIDEO BRONCHOSCOPY Bilateral 07/20/2017   Procedure: VIDEO BRONCHOSCOPY WITH FLUORO;  Surgeon: Collene Gobble, MD;  Location: [facility];  Service: Cardiopulmonary;  Laterality: Bilateral;    REVIEW OF SYSTEMS:  A comprehensive review of systems was negative except for: Respiratory: positive for cough and dyspnea on exertion   PHYSICAL EXAMINATION: General appearance: alert, cooperative, fatigued and no distress Head: Normocephalic, without obvious abnormality, atraumatic Neck: no adenopathy, no JVD, supple, symmetrical, trachea midline and thyroid not enlarged, symmetric, no tenderness/mass/nodules Lymph nodes: Cervical, supraclavicular, and axillary nodes normal. Resp: clear to auscultation bilaterally Back: symmetric, no curvature. ROM normal. No CVA tenderness. Cardio: regular rate and rhythm, S1, S2 normal, no murmur, click, rub or gallop GI: soft, non-tender; bowel sounds normal; no masses,  no organomegaly Extremities: extremities normal, atraumatic, no cyanosis or edema  ECOG PERFORMANCE STATUS: 1 - Symptomatic but completely ambulatory  Blood pressure (!) 143/66, pulse  71, temperature 98.2 F (36.8 C), temperature source Oral, resp. rate 17, height 5\' 5"  (1.651 m), weight 174 lb (78.9 kg), SpO2 99 %.  LABORATORY DATA: Lab Results  Component Value Date   WBC 10.4 05/28/2019   HGB 12.8 05/28/2019   HCT 40.6 05/28/2019   MCV 100.0 05/28/2019   PLT 198 05/28/2019      Chemistry      Component Value Date/Time   NA 144 05/07/2019 1113   K 4.4 05/07/2019 1113   CL 105 05/07/2019 1113   CO2 30 05/07/2019 1113   BUN 24 (H) 05/07/2019 1113   CREATININE 1.29 (H) 05/07/2019 1113      Component Value Date/Time   CALCIUM 9.1 05/07/2019 1113   ALKPHOS 78 05/07/2019 1113   AST 10 (L) 05/07/2019 1113   ALT 6 05/07/2019 1113   BILITOT 0.3 05/07/2019 1113       RADIOGRAPHIC STUDIES: No results found.   ASSESSMENT AND PLAN: This is a very pleasant 79 years old white female with stage IV non-small cell lung cancer, adenocarcinoma with  no actionable mutations. She is currently undergoing systemic chemotherapy with carboplatin, Alimta and Ketruda (pembrolizumab) status post 15 cycles.  Starting from cycle #5 she is on maintenance treatment with Alimta and Ketruda (pembrolizumab). She has been in observation for several months with no treatment. Imaging studies showed evidence for disease progression. The patient resumed treatment with single agent Keytruda status post 5 cycles.  Her first cycle was started on February 12, 2019. She has been tolerating this treatment well with no concerning adverse effects. I recommended for her to proceed with cycle #6 of her treatment today as planned. She will come back for follow-up visit in 3 weeks for evaluation before the next cycle of her treatment. For the COPD and the inflammatory process in the lung she is followed by Dr. Valeta Harms. For the history of pulmonary embolism she will continue her current treatment with Xarelto. The patient was advised to call immediately if she has any concerning symptoms in the interval.  The patient voices understanding of current disease status and treatment options and is in agreement with the current care plan. All questions were answered. The patient knows to call the clinic with any problems, questions or concerns. We can certainly see the patient much sooner if necessary.  Disclaimer: This note was dictated with voice recognition software. Similar sounding words can inadvertently be transcribed and may not be corrected upon review.

## 2019-05-29 NOTE — Progress Notes (Signed)
_0  ID: Erin Myers, female    DOB: [birth year], 79 y.o.   MRN: [id number]  Chief Complaint  Patient presents with  . Follow-up    Increased SOB for the past week. Productive cough with clear phlegm. Denies any chest tightness.     Referring provider: [name], MD  HPI:  79 year old female patient seen in our office by Dr. Valeta Harms, followed for bronchiectasis, cough, dyspnea on exertion  PMH: Stage IV (T3, N0, M1 a)non-small cell lung cancer, well-differentiated adenocarcinoma presented with multifocal disease involving mainly the right upper lobe as well as the right middle lobe and left lower lobe of the lung diagnosed in May 2019. Smoker/ Smoking History: Former smoker.  22.5-pack-year history. Maintenance: Symbicort 160 Pt of: Dr. Valeta Harms  05/30/2019  - Visit   79 year old female on office for chronic hypoxic respiratory failure in the setting of stage IV lung cancer.  Patient has a history of lobectomy as well as ongoing chemotherapy managed by Dr. Earlie Server.  Also undergoing immunotherapy.  Last seen by our office in September/2020 to establish care with Dr. Valeta Harms.  At that time patient was requested to follow-up in 4 months.  She was encouraged to remain on Symbicort, duo nebs as well as refill of oxygen supplies.  Patient presenting to office today reporting 1 week of increased cough and dyspnea.  She also has concerns regarding her oxygen levels.  She reports that at home when she is maintained on 4 L continuous her oxygen levels are running around 94%.  When she presented to the oncology office she was 99% on 4 L.  Presenting to our office today on 4 L pulsed her oxygen saturations were 93%.  Walk today in office reveals the patient needs 3 L continuous to maintain oxygen saturations above 88%.  Patient reports adherence to Symbicort 160 as well as duo nebs 3 times daily.  She has recently followed up with oncology.  Their assessment and plan as listed below:   05/28/19 - Onco -Mohammed:  CURRENT THERAPY: 1) Carboplatin for an AUC of 5, Alimta 500 mg/m2 and Keytruda 200 mg IV given every 3 weeks. First dosegiven on 08/18/2017.Status post 20cycles.  Starting from cycle #5 the patient is on maintenance Alimta and Ketruda (pembrolizumab) every 3 weeks.  Her treatment has been on hold for several months. She will start treatment with single agent Keytruda on February 12, 2019. 2) Xarelto 15 mg p.o. twice daily for the first 3 weeks followed by 20 mg p.o. Daily.  INTERVAL HISTORY: Erin Myers 79 y.o. female returns to the clinic today for follow-up visit.  The patient is feeling fine today with no concerning complaints except for the baseline shortness of breath increased with the seasonal allergy.  She has an appointment with her pulmonologist soon for evaluation of the seasonal allergy.  She denied having any chest pain, cough or hemoptysis.  She denied having any fever or chills.  She has no nausea, vomiting, diarrhea or constipation.  She denied having any headache or visual changes.  She continues to tolerate her treatment with Keytruda fairly well.  ASSESSMENT AND PLAN: This is a very pleasant 79 years old white female with stage IV non-small cell lung cancer, adenocarcinoma with no actionable mutations. She is currently undergoing systemic chemotherapy with carboplatin, Alimta and Ketruda (pembrolizumab) status post 15 cycles.  Starting from cycle #5 she is on maintenance treatment with Alimta and Ketruda (pembrolizumab). She has been in observation for several months with no  treatment. Imaging studies showed evidence for disease progression. The patient resumed treatment with single agent Keytruda status post 5 cycles.  Her first cycle was started on February 12, 2019. She has been tolerating this treatment well with no concerning adverse effects. I recommended for her to proceed with cycle #6 of her treatment today as planned. She will come  back for follow-up visit in 3 weeks for evaluation before the next cycle of her treatment. For the COPD and the inflammatory process in the lung she is followed by Dr. Valeta Harms. For the history of pulmonary embolism she will continue her current treatment with Xarelto. The patient was advised to call immediately if she has any concerning symptoms in the interval. The patient voices understanding of current disease status and treatment options and is in agreement with the current care plan. All questions were answered. The patient knows to call the clinic with any problems, questions or concerns. We can certainly see the patient much sooner if necessary.    Questionaires / Pulmonary Flowsheets:   MMRC: mMRC Dyspnea Scale mMRC Score  05/30/2019 3    Tests:   04/15/2019-CT chest without contrast-stable appearance of indistinct nodularity groundglass opacities and consolidation favoring the lung bases, appearance remains abnormal but nonspecific and could be seen in the setting of chronic atypical infection, hypersensitivity pneumonitis, sarcoidosis, lymphogenic carcinomatosis, chronic eosinophilic pneumonia and cryptogenic organizing pneumonia.  Aortic arthrosclerosis, cholelithiasis  02/20/2018-CT chest with contrast- persistent right upper lobe findings with percent perhaps slight contraction or decreased size of the more solid-appearing component, persistent fairly extensive patchy groundglass opacity and tree-in-bud appearance in lower lung zones this most likely chronic inflammation or atypical infection such as MAC, no new worrisome pulmonary lesions stable borderline mediastinal and hilar lymph nodes, stable right middle lobe bronchiectasis, stable underlying emphysematous changes  10/16/2017-CT chest chest-progressive solid consolidation part of the right upper lobe aggressive appearing bilateral lower lobe process likely interstitial small bilateral pulmonary emboli noted  08/09/2017-PET scan-  borderline hypermetabolic right upper lobe consolidation and groundglass and hypometabolic patchy groundglass in both lower lobes  05/24/2017-echocardiogram LV ejection fraction 65 to 70%   05/02/2017-spirometry -normal spirometry  07/20/17 -bronchoscopy with BAL-Dr. Lamonte Sakai >>> Negative AFB, fungal, culture >>> Transbronchial brushings-malignant cells, consistent with adenocarcinoma >>> Surgical pathology-transbronchial biopsy right upper lobe, adenocarcinoma, well-differentiated   FENO:  No results found for: NITRICOXIDE  PFT: No flowsheet data found.  WALK:  No flowsheet data found.  Imaging: No results found.  Lab Results:  CBC    Component Value Date/Time   WBC 10.4 05/28/2019 0918   WBC 15.2 (H) 08/17/2017 1155   RBC 4.06 05/28/2019 0918   HGB 12.8 05/28/2019 0918   HCT 40.6 05/28/2019 0918   PLT 198 05/28/2019 0918   MCV 100.0 05/28/2019 0918   MCH 31.5 05/28/2019 0918   MCHC 31.5 05/28/2019 0918   RDW 13.5 05/28/2019 0918   LYMPHSABS 1.5 05/28/2019 0918   MONOABS 0.7 05/28/2019 0918   EOSABS 0.3 05/28/2019 0918   BASOSABS 0.1 05/28/2019 0918    BMET    Component Value Date/Time   NA 142 05/28/2019 0918   K 4.3 05/28/2019 0918   CL 105 05/28/2019 0918   CO2 29 05/28/2019 0918   GLUCOSE 100 (H) 05/28/2019 0918   BUN 23 05/28/2019 0918   CREATININE 1.20 (H) 05/28/2019 0918   CALCIUM 9.0 05/28/2019 0918   GFRNONAA 44 (L) 05/28/2019 0918   GFRAA 51 (L) 05/28/2019 0918    BNP No results found for:  BNP  ProBNP No results found for: PROBNP  Specialty Problems      Pulmonary Problems   OSA on CPAP    on C-pap      Dyspnea   Community acquired pneumonia of right upper lobe of lung   Acute on chronic respiratory failure with hypoxia (HCC)   Sinusitis   Adenocarcinoma of right lung, stage 4 ([place])    07/20/17 -bronchoscopy with BAL-Dr. Lamonte Sakai >>> Negative AFB, fungal, culture >>> Transbronchial brushings-malignant cells, consistent with adenocarcinoma  >>> Surgical pathology-transbronchial biopsy right upper lobe, adenocarcinoma, well-differentiated  08/09/2017-PET scan- borderline hypermetabolic right upper lobe consolidation and groundglass and hypometabolic patchy groundglass in both lower lobes  10/16/2017-CT chest chest-progressive solid consolidation part of the right upper lobe aggressive appearing bilateral lower lobe process likely interstitial small bilateral pulmonary emboli noted        Cough   Bronchiectasis ([place])    07/20/17 -bronchoscopy with BAL-Dr. Lamonte Sakai >>> Negative AFB, fungal, culture >>> Transbronchial brushings-malignant cells, consistent with adenocarcinoma >>> Surgical pathology-transbronchial biopsy right upper lobe, adenocarcinoma, well-differentiated  02/20/2018-CT chest with contrast- persistent right upper lobe findings with percent perhaps slight contraction or decreased size of the more solid-appearing component, persistent fairly extensive patchy groundglass opacity and tree-in-bud appearance in lower lung zones this most likely chronic inflammation or atypical infection such as MAC, no new worrisome pulmonary lesions stable borderline mediastinal and hilar lymph nodes, stable right middle lobe bronchiectasis, stable underlying emphysematous changes      Recurrent right pleural effusion   Chronic hypoxemic respiratory failure (HCC)      Allergies  Allergen Reactions  . Pembrolizumab Anaphylaxis  . Adhesive [Tape] Other (See Comments)    Irritates skin   . Codeine Other (See Comments)    Skin crawlinf feeling  . Lactose Intolerance (Gi) Diarrhea  . Monosodium Glutamate Diarrhea  . Sulfa Antibiotics Nausea And Vomiting    Immunization History  Administered Date(s) Administered  . Fluad Quad(high Dose 65+) 11/08/2018  . Influenza Split 12/14/2016  . Influenza, High Dose Seasonal PF 02/20/2014  . Influenza,inj,Quad PF,6+ Mos 12/13/2014  . Influenza-Unspecified 01/25/2018  . PFIZER SARS-COV-2  Vaccination 04/05/2019, 04/23/2019  . Pneumococcal Polysaccharide-23 07/17/2013    Past Medical History:  Diagnosis Date  . Anxiety   . Diabetes mellitus without complication ([place])   . Dyslipidemia   . HTN (hypertension)   . Hyperlipemia   . Hypothyroid   . Hypothyroidism   . Lung cancer ([place]) 2000   Carcinoid, s/p RLL lobectomy  . Obesity   . Ovarian cancer ([place])   . Palpitations 2009 and 2006   Negative nuclear stress test   . Sleep apnea    on C-pap    Tobacco History: Social History   Tobacco Use  Smoking Status Former Smoker  . Packs/day: 0.75  . Years: 30.00  . Pack years: 22.50  . Types: Cigarettes  . Quit date: 08/18/1991  . Years since quitting: 27.8  Smokeless Tobacco Never Used   Counseling given: Yes   Continue to not smoke  Outpatient Encounter Medications as of 05/30/2019  Medication Sig  . acetaminophen (TYLENOL) 500 MG tablet Take 500 mg by mouth every 6 (six) hours as needed.  . benzonatate (TESSALON) 200 MG capsule Take 1 capsule (200 mg total) by mouth 3 (three) times daily as needed for cough.  . cholecalciferol (VITAMIN D3) 25 MCG (1000 UT) tablet Take 1,000 Units by mouth daily.  . clorazepate (TRANXENE) 7.5 MG tablet Take 7.5 mg by mouth 2 (  two) times daily as needed for anxiety.  . docusate sodium (COLACE) 100 MG capsule Take 100 mg by mouth 2 (two) times daily.  [name] ipratropium-albuterol (DUONEB) 0.5-2.5 (3) MG/3ML SOLN INHALE THE CONTENTS OF 1 VIAL VIA NEBULIZER 3 TIMES A DAY.  [name] levothyroxine (SYNTHROID) 112 MCG tablet Take 112 mcg by mouth daily.  [name] lidocaine-prilocaine (EMLA) cream Apply 1 application topically as needed.  . metoprolol succinate (TOPROL-XL) 50 MG 24 hr tablet Take 50 mg by mouth 2 (two) times daily.   . Multiple Vitamins-Minerals (VISION FORMULA 2 PO) Take by mouth. OTC "Vision pills for Macular degeneration"  . rosuvastatin (CRESTOR) 10 MG tablet Take 10 mg by mouth every evening.   . SYMBICORT 160-4.5 MCG/ACT inhaler  INHALE 2 PUFFS INTO THE LUNGS 2 TIMES DAILY.  . valACYclovir (VALTREX) 500 MG tablet Take 500 mg by mouth daily.  [name] 20 MG TABS tablet TAKE 1 TABLET BY MOUTH DAILY WITH SUPPER.  [name] amoxicillin-clavulanate (AUGMENTIN) 875-125 MG tablet Take 1 tablet by mouth 2 (two) times daily.  . [DISCONTINUED] prochlorperazine (COMPAZINE) 10 MG tablet Take 1 tablet (10 mg total) by mouth every 6 (six) hours as needed for nausea or vomiting. (Patient not taking: Reported on 04/16/2019)   No facility-administered encounter medications on file as of 05/30/2019.     Review of Systems  Review of Systems  Constitutional: Positive for fatigue. Negative for activity change and fever.  HENT: Positive for congestion, nosebleeds and rhinorrhea. Negative for sinus pressure, sinus pain and sore throat.   Respiratory: Positive for cough, shortness of breath and wheezing.   Cardiovascular: Negative for chest pain and palpitations.  Musculoskeletal: Negative for arthralgias.  Neurological: Negative for dizziness.  Psychiatric/Behavioral: Negative for sleep disturbance. The patient is not nervous/anxious.      Physical Exam  BP 118/72   Pulse 78   Temp 97.7 F (36.5 C) (Temporal)   Ht _0  (1.651 m)   Wt 172 lb (78 kg)   SpO2 93% Comment: on 4L  BMI 28.62 kg/m   Wt Readings from Last 5 Encounters:  05/30/19 172 lb (78 kg)  05/28/19 174 lb (78.9 kg)  05/07/19 174 lb 14.4 oz (79.3 kg)  04/16/19 175 lb 3.2 oz (79.5 kg)  03/26/19 172 lb 9.6 oz (78.3 kg)    BMI Readings from Last 5 Encounters:  05/30/19 28.62 kg/m  05/28/19 28.96 kg/m  05/07/19 29.10 kg/m  04/16/19 29.15 kg/m  03/26/19 28.72 kg/m     Physical Exam Vitals and nursing note reviewed.  Constitutional:      General: She is not in acute distress.    Appearance: Normal appearance. She is obese.  HENT:     Head: Normocephalic and atraumatic.     Right Ear: External ear normal.     Left Ear: External ear normal.     Nose:  Rhinorrhea present. No congestion.     Right Nostril: Epistaxis (healing ) present.     Mouth/Throat:     Mouth: Mucous membranes are moist.     Pharynx: Oropharynx is clear.  Eyes:     Pupils: Pupils are equal, round, and reactive to light.  Cardiovascular:     Rate and Rhythm: Normal rate and regular rhythm.     Pulses: Normal pulses.     Heart sounds: Normal heart sounds. No murmur.  Pulmonary:     Effort: Pulmonary effort is normal. No respiratory distress.     Breath sounds: No decreased air movement. Rales (bibasilar  rales, L> R) present. No decreased breath sounds or wheezing.  Abdominal:     General: Abdomen is flat. Bowel sounds are normal. There is no distension.     Palpations: Abdomen is soft. There is no mass.     Tenderness: There is no abdominal tenderness.  Musculoskeletal:     Cervical back: Normal range of motion.  Skin:    General: Skin is warm and dry.     Capillary Refill: Capillary refill takes less than 2 seconds.  Neurological:     General: No focal deficit present.     Mental Status: She is alert and oriented to person, place, and time. Mental status is at baseline.     Gait: Gait abnormal (walks with cane ).  Psychiatric:        Mood and Affect: Mood normal.        Behavior: Behavior normal.        Thought Content: Thought content normal.        Judgment: Judgment normal.       Assessment & Plan:   Adenocarcinoma of right lung, stage 4 (HCC) Plan: Continue to follow-up with oncology  Bronchiectasis [facility]) Plan: Will treat with Augmentin today   Chronic hypoxemic respiratory failure ([place]) Walk today in office patient required 3 L continuous with physical exertion  Plan: Patient to use 3 L continuous at home, can use 4 L pulsed via POC when needed Maintain oxygen saturations above 88 to 92%  Cough Plan: Augmentin today May need to consider swallow study if patient continues to struggle with coughing specifically after meals  Dyspnea  This is likely multifactorial given her ongoing treatment  Plan: Continue Symbicort 160 Continue DuoNebs 3 times daily Continue oxygen therapy to maintain oxygen saturations above 88%    Return in about 6 weeks (around 07/11/2019), or if symptoms worsen or fail to improve, for Follow up with Dr. Valeta Harms.   Lauraine Rinne, NP 05/30/2019   This appointment required 42 minutes of patient care (this includes precharting, chart review, review of results, face-to-face care, etc.).

## 2019-05-30 ENCOUNTER — Ambulatory Visit (INDEPENDENT_AMBULATORY_CARE_PROVIDER_SITE_OTHER): Payer: PPO | Admitting: Pulmonary Disease

## 2019-05-30 ENCOUNTER — Other Ambulatory Visit: Payer: Self-pay

## 2019-05-30 ENCOUNTER — Telehealth: Payer: Self-pay | Admitting: Pulmonary Disease

## 2019-05-30 ENCOUNTER — Encounter: Payer: Self-pay | Admitting: Pulmonary Disease

## 2019-05-30 VITALS — BP 118/72 | HR 78 | Temp 97.7°F | Ht 65.0 in | Wt 172.0 lb

## 2019-05-30 DIAGNOSIS — J9611 Chronic respiratory failure with hypoxia: Secondary | ICD-10-CM | POA: Diagnosis not present

## 2019-05-30 DIAGNOSIS — J479 Bronchiectasis, uncomplicated: Secondary | ICD-10-CM | POA: Diagnosis not present

## 2019-05-30 DIAGNOSIS — R059 Cough, unspecified: Secondary | ICD-10-CM

## 2019-05-30 DIAGNOSIS — R918 Other nonspecific abnormal finding of lung field: Secondary | ICD-10-CM

## 2019-05-30 DIAGNOSIS — R0600 Dyspnea, unspecified: Secondary | ICD-10-CM

## 2019-05-30 DIAGNOSIS — R05 Cough: Secondary | ICD-10-CM

## 2019-05-30 DIAGNOSIS — C3491 Malignant neoplasm of unspecified part of right bronchus or lung: Secondary | ICD-10-CM | POA: Diagnosis not present

## 2019-05-30 MED ORDER — AMOXICILLIN-POT CLAVULANATE 875-125 MG PO TABS: 1.0000 | ORAL_TABLET | Freq: Two times a day (BID) | ORAL | 0 refills | Status: DC

## 2019-05-30 NOTE — Assessment & Plan Note (Signed)
Plan: Augmentin today May need to consider swallow study if patient continues to struggle with coughing specifically after meals

## 2019-05-30 NOTE — Patient Instructions (Addendum)
You were seen today by Lauraine Rinne, NP  for:   Nice seeing you in office today.  Thank you for following up.  We will treat you with a course of antibiotics today.  I will also discussed your CT with Dr. Valeta Harms.  We will bring you back in 6 to 8 weeks to see Dr. Valeta Harms in person.  Take care and stay safe,  Erin Myers   1. Cough - amoxicillin-clavulanate (AUGMENTIN) 875-125 MG tablet; Take 1 tablet by mouth 2 (two) times daily.  Dispense: 14 tablet; Refill: 0  Please let us know if you continue to struggle with coughing specifically after eating or during eating.  We may need to consider a swallow evaluation.  2. Bronchiectasis without complication (HCC)  Bronchiectasis: This is the medical term which indicates that you have damage, dilated airways making you more susceptible to respiratory infection. Let us know if you have cough with change in mucus color or fevers or chills.  At that point you would need an antibiotic. Maintain a healthy nutritious diet, eating whole foods Take your medications as prescribed    3. Chronic hypoxemic respiratory failure (HCC)  Walk today to assess for exertional oxygen needs  You required 3 L with physical exertion today  Continue oxygen therapy as prescribed  >>>maintain oxygen saturations greater than 88 percent  >>>if unable to maintain oxygen saturations please contact the office  >>>do not smoke with oxygen  >>>can use nasal saline gel or nasal saline rinses to moisturize nose if oxygen causes dryness  4. Dyspnea, unspecified type  Continue Symbicort >>> 2 puffs in the morning right when you wake up, rinse out your mouth after use, 12 hours later 2 puffs, rinse after use >>> Take this daily, no matter what >>> This is not a rescue inhaler   Continue DuoNebs 3 times a day  5. Adenocarcinoma of right lung, stage 4 ([place])  Continue follow-up with oncology as outlined   We recommend today:   Meds ordered this encounter  Medications  .  amoxicillin-clavulanate (AUGMENTIN) 875-125 MG tablet    Sig: Take 1 tablet by mouth 2 (two) times daily.    Dispense:  14 tablet    Refill:  0    Follow Up:    Return in about 6 weeks (around 07/11/2019), or if symptoms worsen or fail to improve, for Follow up with Dr. Valeta Harms.  If no availability then closest appt to this time frame with BI   Please do your part to reduce the spread of COVID-19:      Reduce your risk of any infection  and COVID19 by using the similar precautions used for avoiding the common cold or flu:  [name] Wash your hands often with soap and warm water for at least 20 seconds.  If soap and water are not readily available, use an alcohol-based hand sanitizer with at least 60% alcohol.  . If coughing or sneezing, cover your mouth and nose by coughing or sneezing into the elbow areas of your shirt or coat, into a tissue or into your sleeve (not your hands). Langley Gauss A MASK when in public  . Avoid shaking hands with others and consider head nods or verbal greetings only. . Avoid touching your eyes, nose, or mouth with unwashed hands.  . Avoid close contact with people who are sick. . Avoid places or events with large numbers of people in one location, like concerts or sporting events. . If you have some symptoms but  not all symptoms, continue to monitor at home and seek medical attention if your symptoms worsen. . If you are having a medical emergency, call 911.   [place] / e-Visit: [email or web address]         [facility]: [facility]: [phone]                   [facility]: [phone]     It is flu season:   >>> Best ways to protect herself from the flu: Receive the yearly flu vaccine, practice good hand hygiene washing with soap and also using hand sanitizer when available, eat a nutritious meals, get  adequate rest, hydrate appropriately   Please contact the office if your symptoms worsen or you have concerns that you are not improving.   Thank you for choosing [facility] for your healthcare, and for allowing [place] to partner with you on your healthcare journey. I am thankful to be able to provide care to you today.   Wyn Quaker FNP-C

## 2019-05-30 NOTE — Progress Notes (Signed)
PCCM: agree. Thanks for seeing  Erin Nash, DO [facility] 05/30/2019 6:10 PM

## 2019-05-30 NOTE — Assessment & Plan Note (Signed)
Plan: Continue to follow-up with oncology

## 2019-05-30 NOTE — Assessment & Plan Note (Signed)
This is likely multifactorial given her ongoing treatment  Plan: Continue Symbicort 160 Continue DuoNebs 3 times daily Continue oxygen therapy to maintain oxygen saturations above 88%

## 2019-05-30 NOTE — Assessment & Plan Note (Signed)
Plan: Will treat with Augmentin today

## 2019-05-30 NOTE — Assessment & Plan Note (Signed)
Walk today in office patient required 3 L continuous with physical exertion  Plan: Patient to use 3 L continuous at home, can use 4 L pulsed via POC when needed Maintain oxygen saturations above 88 to 92%

## 2019-05-30 NOTE — Telephone Encounter (Signed)
05/30/2019  Reach the patient by telephone.  Dr. Valeta Harms is also reviewed patient's most recent CT from February/2021.  We will treat with antibiotics today.  We will plan on repeating CT in 6 to 8 weeks with follow-up with Dr. Valeta Harms.  We will route to Dr. Valeta Harms as Juluis Rainier as well as to Dr. Earlie Server.  Nothing further needed.  Wyn Quaker FNP

## 2019-06-07 DIAGNOSIS — J449 Chronic obstructive pulmonary disease, unspecified: Secondary | ICD-10-CM | POA: Diagnosis not present

## 2019-06-10 ENCOUNTER — Other Ambulatory Visit: Payer: Self-pay | Admitting: Internal Medicine

## 2019-06-10 DIAGNOSIS — I2699 Other pulmonary embolism without acute cor pulmonale: Secondary | ICD-10-CM

## 2019-06-11 NOTE — Progress Notes (Signed)
Pharmacist Chemotherapy Monitoring - Follow Up Assessment    I verify that I have reviewed each item in the below checklist:  . Regimen for the patient is scheduled for the appropriate day and plan matches scheduled date. [name] Appropriate non-routine labs are ordered dependent on drug ordered. . If applicable, additional medications reviewed and ordered per protocol based on lifetime cumulative doses and/or treatment regimen.   Plan for follow-up and/or issues identified: No . I-vent associated with next due treatment: No . MD and/or nursing notified: No  Erin Myers K 06/11/2019 12:55 PM

## 2019-06-17 ENCOUNTER — Encounter: Payer: Self-pay | Admitting: Internal Medicine

## 2019-06-17 ENCOUNTER — Inpatient Hospital Stay: Payer: PPO | Attending: Internal Medicine | Admitting: Internal Medicine

## 2019-06-17 ENCOUNTER — Inpatient Hospital Stay: Payer: PPO

## 2019-06-17 ENCOUNTER — Other Ambulatory Visit: Payer: Self-pay

## 2019-06-17 VITALS — BP 144/53 | HR 74 | Temp 98.2°F | Resp 20 | Ht 65.0 in | Wt 171.2 lb

## 2019-06-17 DIAGNOSIS — C3490 Malignant neoplasm of unspecified part of unspecified bronchus or lung: Secondary | ICD-10-CM | POA: Diagnosis not present

## 2019-06-17 DIAGNOSIS — E669 Obesity, unspecified: Secondary | ICD-10-CM | POA: Insufficient documentation

## 2019-06-17 DIAGNOSIS — C3491 Malignant neoplasm of unspecified part of right bronchus or lung: Secondary | ICD-10-CM

## 2019-06-17 DIAGNOSIS — Z7901 Long term (current) use of anticoagulants: Secondary | ICD-10-CM | POA: Insufficient documentation

## 2019-06-17 DIAGNOSIS — Z79899 Other long term (current) drug therapy: Secondary | ICD-10-CM | POA: Diagnosis not present

## 2019-06-17 DIAGNOSIS — I700 Atherosclerosis of aorta: Secondary | ICD-10-CM | POA: Insufficient documentation

## 2019-06-17 DIAGNOSIS — Z86711 Personal history of pulmonary embolism: Secondary | ICD-10-CM | POA: Diagnosis not present

## 2019-06-17 DIAGNOSIS — I10 Essential (primary) hypertension: Secondary | ICD-10-CM

## 2019-06-17 DIAGNOSIS — J449 Chronic obstructive pulmonary disease, unspecified: Secondary | ICD-10-CM | POA: Diagnosis not present

## 2019-06-17 DIAGNOSIS — E785 Hyperlipidemia, unspecified: Secondary | ICD-10-CM | POA: Insufficient documentation

## 2019-06-17 DIAGNOSIS — E039 Hypothyroidism, unspecified: Secondary | ICD-10-CM | POA: Diagnosis not present

## 2019-06-17 DIAGNOSIS — Z8543 Personal history of malignant neoplasm of ovary: Secondary | ICD-10-CM | POA: Diagnosis not present

## 2019-06-17 DIAGNOSIS — G4730 Sleep apnea, unspecified: Secondary | ICD-10-CM | POA: Insufficient documentation

## 2019-06-17 DIAGNOSIS — Z5112 Encounter for antineoplastic immunotherapy: Secondary | ICD-10-CM

## 2019-06-17 DIAGNOSIS — Z95828 Presence of other vascular implants and grafts: Secondary | ICD-10-CM

## 2019-06-17 DIAGNOSIS — F419 Anxiety disorder, unspecified: Secondary | ICD-10-CM | POA: Diagnosis not present

## 2019-06-17 DIAGNOSIS — G629 Polyneuropathy, unspecified: Secondary | ICD-10-CM | POA: Diagnosis not present

## 2019-06-17 DIAGNOSIS — E119 Type 2 diabetes mellitus without complications: Secondary | ICD-10-CM | POA: Diagnosis not present

## 2019-06-17 LAB — CMP (CANCER CENTER ONLY)
ALT: 6 U/L (ref 0–44)
AST: 13 U/L — ABNORMAL LOW (ref 15–41)
Albumin: 3 g/dL — ABNORMAL LOW (ref 3.5–5.0)
Alkaline Phosphatase: 71 U/L (ref 38–126)
Anion gap: 10 (ref 5–15)
BUN: 14 mg/dL (ref 8–23)
CO2: 29 mmol/L (ref 22–32)
Calcium: 9 mg/dL (ref 8.9–10.3)
Chloride: 106 mmol/L (ref 98–111)
Creatinine: 1.15 mg/dL — ABNORMAL HIGH (ref 0.44–1.00)
GFR, Est AFR Am: 54 mL/min — ABNORMAL LOW (ref >60)
GFR, Estimated: 46 mL/min — ABNORMAL LOW (ref >60)
Glucose, Bld: 102 mg/dL — ABNORMAL HIGH (ref 70–99)
Potassium: 4.1 mmol/L (ref 3.5–5.1)
Sodium: 145 mmol/L (ref 135–145)
Total Bilirubin: 0.3 mg/dL (ref 0.3–1.2)
Total Protein: 7.1 g/dL (ref 6.5–8.1)

## 2019-06-17 LAB — CBC WITH DIFFERENTIAL (CANCER CENTER ONLY)
Abs Immature Granulocytes: 0.03 10*3/uL (ref 0.00–0.07)
Basophils Absolute: 0.1 10*3/uL (ref 0.0–0.1)
Basophils Relative: 1 %
Eosinophils Absolute: 0.2 10*3/uL (ref 0.0–0.5)
Eosinophils Relative: 2 %
HCT: 40.7 % (ref 36.0–46.0)
Hemoglobin: 12.6 g/dL (ref 12.0–15.0)
Immature Granulocytes: 0 %
Lymphocytes Relative: 14 %
Lymphs Abs: 1.5 10*3/uL (ref 0.7–4.0)
MCH: 31.7 pg (ref 26.0–34.0)
MCHC: 31 g/dL (ref 30.0–36.0)
MCV: 102.5 fL — ABNORMAL HIGH (ref 80.0–100.0)
Monocytes Absolute: 0.8 10*3/uL (ref 0.1–1.0)
Monocytes Relative: 7 %
Neutro Abs: 8.7 10*3/uL — ABNORMAL HIGH (ref 1.7–7.7)
Neutrophils Relative %: 76 %
Platelet Count: 205 10*3/uL (ref 150–400)
RBC: 3.97 MIL/uL (ref 3.87–5.11)
RDW: 13.6 % (ref 11.5–15.5)
WBC Count: 11.3 10*3/uL — ABNORMAL HIGH (ref 4.0–10.5)
nRBC: 0 % (ref 0.0–0.2)

## 2019-06-17 LAB — TSH: TSH: 0.979 u[IU]/mL (ref 0.308–3.960)

## 2019-06-17 MED ORDER — ACETAMINOPHEN 325 MG PO TABS
650.0000 mg | ORAL_TABLET | Freq: Once | ORAL | Status: AC
  Administered 2019-06-17: 650 mg via ORAL

## 2019-06-17 MED ORDER — SODIUM CHLORIDE 0.9 % IV SOLN
Freq: Once | INTRAVENOUS | Status: AC
  Filled 2019-06-17: qty 250

## 2019-06-17 MED ORDER — SODIUM CHLORIDE 0.9 % IV SOLN
200.0000 mg | Freq: Once | INTRAVENOUS | Status: AC
  Administered 2019-06-17: 200 mg via INTRAVENOUS
  Filled 2019-06-17: qty 8

## 2019-06-17 MED ORDER — SODIUM CHLORIDE 0.9% FLUSH
10.0000 mL | INTRAVENOUS | Status: DC | PRN
  Administered 2019-06-17: 10 mL
  Filled 2019-06-17: qty 10

## 2019-06-17 MED ORDER — FAMOTIDINE IN NACL 20-0.9 MG/50ML-% IV SOLN
INTRAVENOUS | Status: AC
  Filled 2019-06-17: qty 50

## 2019-06-17 MED ORDER — DIPHENHYDRAMINE HCL 50 MG/ML IJ SOLN
INTRAMUSCULAR | Status: AC
  Filled 2019-06-17: qty 1

## 2019-06-17 MED ORDER — FAMOTIDINE IN NACL 20-0.9 MG/50ML-% IV SOLN
20.0000 mg | Freq: Once | INTRAVENOUS | Status: AC
  Administered 2019-06-17: 20 mg via INTRAVENOUS

## 2019-06-17 MED ORDER — DIPHENHYDRAMINE HCL 50 MG/ML IJ SOLN
25.0000 mg | Freq: Once | INTRAMUSCULAR | Status: AC
  Administered 2019-06-17: 25 mg via INTRAVENOUS

## 2019-06-17 MED ORDER — ACETAMINOPHEN 325 MG PO TABS
ORAL_TABLET | ORAL | Status: AC
  Filled 2019-06-17: qty 2

## 2019-06-17 MED ORDER — HEPARIN SOD (PORK) LOCK FLUSH 100 UNIT/ML IV SOLN
500.0000 [IU] | Freq: Once | INTRAVENOUS | Status: AC | PRN
  Administered 2019-06-17: 500 [IU]
  Filled 2019-06-17: qty 5

## 2019-06-17 NOTE — Patient Instructions (Signed)
[facility] Discharge Instructions for Patients Receiving Chemotherapy  Today you received the following chemotherapy agents:  Keytruda.  To help prevent nausea and vomiting after your treatment, we encourage you to take your nausea medication as directed.   If you develop nausea and vomiting that is not controlled by your nausea medication, call the clinic.   BELOW ARE SYMPTOMS THAT SHOULD BE REPORTED IMMEDIATELY:  *FEVER GREATER THAN 100.5 F  *CHILLS WITH OR WITHOUT FEVER  NAUSEA AND VOMITING THAT IS NOT CONTROLLED WITH YOUR NAUSEA MEDICATION  *UNUSUAL SHORTNESS OF BREATH  *UNUSUAL BRUISING OR BLEEDING  TENDERNESS IN MOUTH AND THROAT WITH OR WITHOUT PRESENCE OF ULCERS  *URINARY PROBLEMS  *BOWEL PROBLEMS  UNUSUAL RASH Items with * indicate a potential emergency and should be followed up as soon as possible.  Feel free to call the clinic should you have any questions or concerns. The clinic phone number is [phone].  Please show the CHEMO ALERT CARD at check-in to the Emergency Department and triage nurse.    

## 2019-06-17 NOTE — Patient Instructions (Signed)

## 2019-06-17 NOTE — Progress Notes (Signed)
[place] [phone]) [phone]   [phone]  OFFICE PROGRESS NOTE  [name], MD [place] [address]  DIAGNOSIS:  1) Stage IV (T3, N0, M1 a)non-small cell lung cancer, well-differentiated adenocarcinoma presented with multifocal disease involving mainly the right upper lobe as well as the right middle lobe and left lower lobe of the lung diagnosed in May 2019. 2) bilateral small pulmonary emboli diagnosed incidentally on CT scan of the chest on October 16, 2017  Guardant 360 testing was negative for actionable mutations.  PRIOR THERAPY:None  CURRENT THERAPY: 1) Carboplatin for an AUC of 5, Alimta 500 mg/m2 and Keytruda 200 mg IV given every 3 weeks. First dosegiven on 08/18/2017.Status post 21 cycles.  Starting from cycle #5 the patient is on maintenance Alimta and Ketruda (pembrolizumab) every 3 weeks.  Her treatment has been on hold for several months. She will start treatment with single agent Keytruda on February 12, 2019. 2) Xarelto 15 mg p.o. twice daily for the first 3 weeks followed by 20 mg p.o. Daily.  INTERVAL HISTORY: Erin Myers 79 y.o. female returns to the clinic today for follow-up visit.  The patient is feeling fine today with no concerning complaints except for the persistent cough and shortness of breath with exertion.  She was recently treated with amoxicillin by pulmonology.  She is currently on home oxygen secondary to COPD.  The patient also has mild peripheral neuropathy in the toes.  She denied having any fever or chills.  She has no current nausea, vomiting, diarrhea or constipation.  She denied having any headache or visual changes.  She continues to tolerate her treatment with maintenance single agent Keytruda fairly well.  She is here today for evaluation before starting cycle #22.  MEDICAL HISTORY: Past Medical History:  Diagnosis Date  . Anxiety   . Diabetes mellitus without  complication ([place])   . Dyslipidemia   . HTN (hypertension)   . Hyperlipemia   . Hypothyroid   . Hypothyroidism   . Lung cancer ([place]) 2000   Carcinoid, s/p RLL lobectomy  . Obesity   . Ovarian cancer ([place])   . Palpitations 2009 and 2006   Negative nuclear stress test   . Sleep apnea    on C-pap    ALLERGIES:  is allergic to pembrolizumab; adhesive [tape]; codeine; lactose intolerance (gi); monosodium glutamate; and sulfa antibiotics.  MEDICATIONS:  Current Outpatient Medications  Medication Sig Dispense Refill  . acetaminophen (TYLENOL) 500 MG tablet Take 500 mg by mouth every 6 (six) hours as needed.    . benzonatate (TESSALON) 200 MG capsule Take 1 capsule (200 mg total) by mouth 3 (three) times daily as needed for cough. 30 capsule 1  . cholecalciferol (VITAMIN D3) 25 MCG (1000 UT) tablet Take 1,000 Units by mouth daily.    . clorazepate (TRANXENE) 7.5 MG tablet Take 7.5 mg by mouth 2 (two) times daily as needed for anxiety.    . docusate sodium (COLACE) 100 MG capsule Take 100 mg by mouth 2 (two) times daily.    [name] ipratropium-albuterol (DUONEB) 0.5-2.5 (3) MG/3ML SOLN INHALE THE CONTENTS OF 1 VIAL VIA NEBULIZER 3 TIMES A DAY. 120 mL 11  . levothyroxine (SYNTHROID) 112 MCG tablet Take 112 mcg by mouth daily.    [name] lidocaine-prilocaine (EMLA) cream Apply 1 application topically as needed. 30 g 0  . metoprolol succinate (TOPROL-XL) 50 MG 24 hr tablet Take 50 mg by mouth 2 (two) times  daily.     . Multiple Vitamins-Minerals (VISION FORMULA 2 PO) Take by mouth. OTC "Vision pills for Macular degeneration"    . rosuvastatin (CRESTOR) 10 MG tablet Take 10 mg by mouth every evening.     . SYMBICORT 160-4.5 MCG/ACT inhaler INHALE 2 PUFFS INTO THE LUNGS 2 TIMES DAILY. 10.2 g 6  . valACYclovir (VALTREX) 500 MG tablet Take 500 mg by mouth daily.    [name] 20 MG TABS tablet TAKE 1 TABLET BY MOUTH DAILY WITH SUPPER. 30 tablet 1   No current facility-administered medications for this visit.      SURGICAL HISTORY:  Past Surgical History:  Procedure Laterality Date  . ABDOMINAL HYSTERECTOMY  2001  . GRADED EXERCISE TOLERANCE TEST  08/2015    Blood pressure demonstrated a hypertensive response to exercise.  There was less than 0.43mm of horizontal ST segment depression in the inferolateral leads. No diagnostic criterial for ischemia. Moderately impaired exercise tolerance. The patient achieved 5.8 mets. This is a low risk study.  . IR FLUORO GUIDE PORT INSERTION RIGHT  08/17/2017  . IR THORACENTESIS ASP PLEURAL SPACE W/IMG GUIDE  07/04/2018  . IR US GUIDE VASC ACCESS RIGHT  08/17/2017  . LOBECTOMY  2000   RLL  . NM MYOCAR PERF WALL MOTION  08/20/2007   EF 74%  EXERCISE CAPACITY 7 METS  . OOPHORECTOMY  2001  . TONSILLECTOMY    . VIDEO BRONCHOSCOPY Bilateral 07/20/2017   Procedure: VIDEO BRONCHOSCOPY WITH FLUORO;  Surgeon: Collene Gobble, MD;  Location: [facility];  Service: Cardiopulmonary;  Laterality: Bilateral;    REVIEW OF SYSTEMS:  A comprehensive review of systems was negative except for: Respiratory: positive for cough and dyspnea on exertion Neurological: positive for paresthesia   PHYSICAL EXAMINATION: General appearance: alert, cooperative, fatigued and no distress Head: Normocephalic, without obvious abnormality, atraumatic Neck: no adenopathy, no JVD, supple, symmetrical, trachea midline and thyroid not enlarged, symmetric, no tenderness/mass/nodules Lymph nodes: Cervical, supraclavicular, and axillary nodes normal. Resp: clear to auscultation bilaterally Back: symmetric, no curvature. ROM normal. No CVA tenderness. Cardio: regular rate and rhythm, S1, S2 normal, no murmur, click, rub or gallop GI: soft, non-tender; bowel sounds normal; no masses,  no organomegaly Extremities: extremities normal, atraumatic, no cyanosis or edema  ECOG PERFORMANCE STATUS: 1 - Symptomatic but completely ambulatory  Blood pressure (!) 144/53, pulse 74, temperature 98.2 F (36.8 C),  temperature source Temporal, resp. rate 20, height 5\' 5"  (1.651 m), weight 171 lb 3.2 oz (77.7 kg), SpO2 99 %.  LABORATORY DATA: Lab Results  Component Value Date   WBC 11.3 (H) 06/17/2019   HGB 12.6 06/17/2019   HCT 40.7 06/17/2019   MCV 102.5 (H) 06/17/2019   PLT 205 06/17/2019      Chemistry      Component Value Date/Time   NA 142 05/28/2019 0918   K 4.3 05/28/2019 0918   CL 105 05/28/2019 0918   CO2 29 05/28/2019 0918   BUN 23 05/28/2019 0918   CREATININE 1.20 (H) 05/28/2019 0918      Component Value Date/Time   CALCIUM 9.0 05/28/2019 0918   ALKPHOS 75 05/28/2019 0918   AST 11 (L) 05/28/2019 0918   ALT <6 05/28/2019 0918   BILITOT 0.3 05/28/2019 0918       RADIOGRAPHIC STUDIES: No results found.   ASSESSMENT AND PLAN: This is a very pleasant 79 years old white female with stage IV non-small cell lung cancer, adenocarcinoma with no actionable mutations. She is currently undergoing  systemic chemotherapy with carboplatin, Alimta and Ketruda (pembrolizumab) status post 15 cycles.  Starting from cycle #5 she is on maintenance treatment with Alimta and Ketruda (pembrolizumab). She has been in observation for several months with no treatment. Imaging studies showed evidence for disease progression. The patient resumed treatment with single agent Keytruda status post 6 cycles.  Her first cycle was started on February 12, 2019. The patient has been tolerating her treatment well with no concerning adverse effects. I recommended for her to proceed with cycle #7 of her treatment today as planned. I will see her back for follow-up visit in 3 weeks for evaluation after repeating CT scan of the chest, abdomen pelvis without contrast for restaging of her disease. For the COPD, she is currently under the care of Dr. Valeta Harms. For the history of pulmonary embolism she will continue her current treatment with Xarelto. The patient was advised to call immediately if she has any concerning  symptoms in the interval. The patient voices understanding of current disease status and treatment options and is in agreement with the current care plan. All questions were answered. The patient knows to call the clinic with any problems, questions or concerns. We can certainly see the patient much sooner if necessary.  Disclaimer: This note was dictated with voice recognition software. Similar sounding words can inadvertently be transcribed and may not be corrected upon review.

## 2019-06-19 ENCOUNTER — Telehealth: Payer: Self-pay | Admitting: Internal Medicine

## 2019-06-19 NOTE — Telephone Encounter (Signed)
Scheduled per los. Called and left msg. Mailed printout  °

## 2019-07-03 NOTE — Progress Notes (Signed)
Pharmacist Chemotherapy Monitoring - Follow Up Assessment    I verify that I have reviewed each item in the below checklist:  . Regimen for the patient is scheduled for the appropriate day and plan matches scheduled date. [name] Appropriate non-routine labs are ordered dependent on drug ordered. . If applicable, additional medications reviewed and ordered per protocol based on lifetime cumulative doses and/or treatment regimen.   Plan for follow-up and/or issues identified: No . I-vent associated with next due treatment: No . MD and/or nursing notified: No  Hillery Bhalla D 07/03/2019 11:48 AM

## 2019-07-05 ENCOUNTER — Other Ambulatory Visit: Payer: Self-pay

## 2019-07-05 ENCOUNTER — Ambulatory Visit (HOSPITAL_COMMUNITY)
Admission: RE | Admit: 2019-07-05 | Discharge: 2019-07-05 | Disposition: A | Discharge status: Home / Self Care | Payer: PPO | Source: Ambulatory Visit | Attending: Internal Medicine | Admitting: Internal Medicine

## 2019-07-05 DIAGNOSIS — J984 Other disorders of lung: Secondary | ICD-10-CM | POA: Diagnosis not present

## 2019-07-05 DIAGNOSIS — C3490 Malignant neoplasm of unspecified part of unspecified bronchus or lung: Secondary | ICD-10-CM

## 2019-07-05 DIAGNOSIS — K8020 Calculus of gallbladder without cholecystitis without obstruction: Secondary | ICD-10-CM | POA: Diagnosis not present

## 2019-07-08 ENCOUNTER — Encounter: Payer: Self-pay | Admitting: Internal Medicine

## 2019-07-08 DIAGNOSIS — J449 Chronic obstructive pulmonary disease, unspecified: Secondary | ICD-10-CM | POA: Diagnosis not present

## 2019-07-08 NOTE — Progress Notes (Signed)
Erin Myers OFFICE PROGRESS NOTE  Erin Battles, MD [place] [address]  DIAGNOSIS:  1) Stage IV (T3, N0, M1 a)non-small cell lung cancer, well-differentiated adenocarcinoma presented with multifocal disease involving mainly the right upper lobe as well as the right middle lobe and left lower lobe of the lung diagnosed in May 2019. 2) bilateral small pulmonary emboli diagnosed incidentally on CT scan of the chest on October 16, 2017  Guardant 360 testing was negative for actionable mutations.  PRIOR THERAPY: None  CURRENT THERAPY:  1) Carboplatin for an AUC of 5, Alimta 500 mg/m2 and Keytruda 200 mg IV given every 3 weeks. First dosegiven on 08/18/2017.Starting maintenance alimta and Keytruda from cycle #5. She took a break after cycle #15 for several months. Status post 22cycles. Starting from cycle #16, she resumed treatment with single agent Keytruda on 02/12/2019.Treatment on hold 07/09/2019 due to suspicious pneumonitis 2) Xarelto 15 mg p.o. twice daily for the first 3 weeks followed by 20 mg p.o. Daily.  INTERVAL HISTORY: Erin Myers 79 y.o. female returns to the clinic for a follow up visit accompanied by her husband. The patient is fair today except she has been experiencing a persistent cough for approximately 4 weeks.  She has been taking Robitussin at nighttime.  She states that she coughs up clear foamy phlegm.  She was treated earlier this month with Augmentin without significant improvement in her symptoms.  She denies any associated symptoms including fevers, nasal congestion, or sore throat.  She denies loss of taste and smell.  She is up-to-date on her COVID-19 vaccine.  She denies any nausea, vomiting, or myalgias. She is on 3-4 L of home oxygen at baseline. She has an appointment with pulmonology tomorrow.  She denies any recent chills or night sweats. She lost 5 lbs since her last appointment due to decreased appetite. She denies  any chest pain or hemoptysis. She denies any nausea, vomiting, or constipation but had diarrhea from having the contrast for her scan.  She denies any headache or visual changes. Her prior mild rash has resolved with topical steroid cream.  The patient recently had a restaging CT scan. The patient is here today for evaluation and to review her scan prior to starting cycle #23.   MEDICAL HISTORY: Past Medical History:  Diagnosis Date  . Anxiety   . Diabetes mellitus without complication ([place])   . Dyslipidemia   . HTN (hypertension)   . Hyperlipemia   . Hypothyroid   . Hypothyroidism   . Lung cancer ([place]) 2000   Carcinoid, s/p RLL lobectomy  . Obesity   . Ovarian cancer ([place])   . Palpitations 2009 and 2006   Negative nuclear stress test   . Sleep apnea    on C-pap    ALLERGIES:  is allergic to pembrolizumab; adhesive [tape]; codeine; lactose intolerance (gi); monosodium glutamate; and sulfa antibiotics.  MEDICATIONS:  Current Outpatient Medications  Medication Sig Dispense Refill  . acetaminophen (TYLENOL) 500 MG tablet Take 500 mg by mouth every 6 (six) hours as needed.    . benzonatate (TESSALON) 200 MG capsule Take 1 capsule (200 mg total) by mouth 3 (three) times daily as needed for cough. 30 capsule 1  . cholecalciferol (VITAMIN D3) 25 MCG (1000 UT) tablet Take 1,000 Units by mouth daily.    . clorazepate (TRANXENE) 7.5 MG tablet Take 7.5 mg by mouth 2 (two) times daily as needed for anxiety.    . docusate sodium (COLACE)  100 MG capsule Take 100 mg by mouth 2 (two) times daily.    [name] ipratropium-albuterol (DUONEB) 0.5-2.5 (3) MG/3ML SOLN INHALE THE CONTENTS OF 1 VIAL VIA NEBULIZER 3 TIMES A DAY. 120 mL 11  . levothyroxine (SYNTHROID) 112 MCG tablet Take 112 mcg by mouth daily.    [name] lidocaine-prilocaine (EMLA) cream Apply 1 application topically as needed. 30 g 0  . metoprolol succinate (TOPROL-XL) 50 MG 24 hr tablet Take 50 mg by mouth 2 (two) times daily.     . Multiple  Vitamins-Minerals (VISION FORMULA 2 PO) Take by mouth. OTC "Vision pills for Macular degeneration"    . predniSONE (DELTASONE) 20 MG tablet Please take 4 tablets (80 mg) by mouth daily for 1 week, followed by 3 tablets (60 mg) daily for 1 week, followed by 2 tablets (70 mg) daily for 1 week, followed by 1 tablet (20 mg), followed by 1/2 a tablet for 1 week 75 tablet 0  . rosuvastatin (CRESTOR) 10 MG tablet Take 10 mg by mouth every evening.     . SYMBICORT 160-4.5 MCG/ACT inhaler INHALE 2 PUFFS INTO THE LUNGS 2 TIMES DAILY. 10.2 g 6  . valACYclovir (VALTREX) 500 MG tablet Take 500 mg by mouth daily.    [name] 20 MG TABS tablet TAKE 1 TABLET BY MOUTH DAILY WITH SUPPER. 30 tablet 1   Current Facility-Administered Medications  Medication Dose Route Frequency Provider Last Rate Last Admin  . sodium chloride flush (NS) 0.9 % injection 10 mL  10 mL Intravenous PRN Kirstine Jacquin L, PA-C   10 mL at 07/09/19 1047    SURGICAL HISTORY:  Past Surgical History:  Procedure Laterality Date  . ABDOMINAL HYSTERECTOMY  2001  . GRADED EXERCISE TOLERANCE TEST  08/2015    Blood pressure demonstrated a hypertensive response to exercise.  There was less than 0.74mm of horizontal ST segment depression in the inferolateral leads. No diagnostic criterial for ischemia. Moderately impaired exercise tolerance. The patient achieved 5.8 mets. This is a low risk study.  . IR FLUORO GUIDE PORT INSERTION RIGHT  08/17/2017  . IR THORACENTESIS ASP PLEURAL SPACE W/IMG GUIDE  07/04/2018  . IR US GUIDE VASC ACCESS RIGHT  08/17/2017  . LOBECTOMY  2000   RLL  . NM MYOCAR PERF WALL MOTION  08/20/2007   EF 74%  EXERCISE CAPACITY 7 METS  . OOPHORECTOMY  2001  . TONSILLECTOMY    . VIDEO BRONCHOSCOPY Bilateral 07/20/2017   Procedure: VIDEO BRONCHOSCOPY WITH FLUORO;  Surgeon: Collene Gobble, MD;  Location: [facility];  Service: Cardiopulmonary;  Laterality: Bilateral;    REVIEW OF SYSTEMS:   Review of Systems   Constitutional: Positive for decreased appetite and weight loss.  Negative for  chills, fatigue, and fever HENT: Negative for mouth sores, nosebleeds, sore throat and trouble swallowing.   Eyes: Negative for eye problems and icterus.  Respiratory: Positive for productive cough and dyspnea on exertion.  Negative for hemoptysis and wheezing.  Cardiovascular: Negative for chest pain and leg swelling.  Gastrointestinal: Positive for diarrhea after her scan contrast.  Negative for abdominal pain, constipation, nausea and vomiting.  Genitourinary: Negative for bladder incontinence, difficulty urinating, dysuria, frequency and hematuria.   Musculoskeletal: Negative for back pain, gait problem, neck pain and neck stiffness.  Skin: Negative for itching and rash (improved).  Neurological: Negative for dizziness, extremity weakness, gait problem, headaches, light-headedness and seizures.  Hematological: Negative for adenopathy. Does not bruise/bleed easily.  Psychiatric/Behavioral: Negative for confusion, depression and sleep disturbance.  The patient is not nervous/anxious.     PHYSICAL EXAMINATION:  Blood pressure 134/62, pulse 86, temperature 98.2 F (36.8 C), temperature source Temporal, resp. rate 18, height 5\' 5"  (1.651 m), weight 166 lb 8 oz (75.5 kg), SpO2 95 %.  ECOG PERFORMANCE STATUS: 1 - Symptomatic but completely ambulatory  Physical Exam  Constitutional: Oriented to person, place, and time and well-developed, well-nourished, and in no distress.  HENT:  Head: Normocephalic and atraumatic.  Mouth/Throat: Oropharynx is clear and moist. No oropharyngeal exudate.  Eyes: Conjunctivae are normal. Right eye exhibits no discharge. Left eye exhibits no discharge. No scleral icterus.  Neck: Normal range of motion. Neck supple.  Cardiovascular: Normal rate, regular rhythm, normal heart sounds and intact distal pulses.   Pulmonary/Chest: Effort normal. Positive for rales and rhonchi. No  respiratory distress.   Abdominal: Soft. Bowel sounds are normal. Exhibits no distension and no mass. There is no tenderness.  Musculoskeletal: Normal range of motion. Exhibits no edema.  Lymphadenopathy:    No cervical adenopathy.  Neurological: Alert and oriented to person, place, and time. Exhibits normal muscle tone. Gait normal. Coordination normal.  Skin: Skin is warm and dry. No rash noted. Not diaphoretic. No erythema. No pallor.  Psychiatric: Mood, memory and judgment normal.  Vitals reviewed.  LABORATORY DATA: Lab Results  Component Value Date   WBC 15.3 (H) 07/09/2019   HGB 13.2 07/09/2019   HCT 43.2 07/09/2019   MCV 102.6 (H) 07/09/2019   PLT 238 07/09/2019      Chemistry      Component Value Date/Time   NA 143 07/09/2019 0843   K 4.4 07/09/2019 0843   CL 101 07/09/2019 0843   CO2 32 07/09/2019 0843   BUN 15 07/09/2019 0843   CREATININE 1.15 (H) 07/09/2019 0843      Component Value Date/Time   CALCIUM 9.5 07/09/2019 0843   ALKPHOS 77 07/09/2019 0843   AST 13 (L) 07/09/2019 0843   ALT 6 07/09/2019 0843   BILITOT 0.3 07/09/2019 0843       RADIOGRAPHIC STUDIES:  CT Abdomen Pelvis Wo Contrast  Result Date: 07/06/2019 CLINICAL DATA:  Follow-up right non-small cell lung carcinoma. Previous right lower lobectomy and chemotherapy. Ongoing immunotherapy. EXAM: CT CHEST, ABDOMEN AND PELVIS WITHOUT CONTRAST TECHNIQUE: Multidetector CT imaging of the chest, abdomen and pelvis was performed following the standard protocol without IV contrast. COMPARISON:  04/15/2019 FINDINGS: CT CHEST FINDINGS Cardiovascular: No acute findings. Aortic atherosclerosis incidentally noted. Port-A-Cath remains in appropriate position. Mediastinum/Lymph Nodes: Scattered sub-cm mediastinal lymph nodes show no significant change. No pathologically enlarged lymph nodes identified on this unenhanced exam. Lungs/Pleura: Multiple small ill-defined nodular opacities and associated airspace disease is  again seen in both lungs with bibasilar predominance. This shows interval progression in the left lower lobe with increased confluent airspace opacity. No evidence of associated pleural effusions. Musculoskeletal: No suspicious bone lesions identified. CT ABDOMEN AND PELVIS FINDINGS Hepatobiliary: No masses visualized on this unenhanced exam. A few tiny calcified gallstones are noted, however there is no evidence of cholecystitis or biliary ductal dilatation. Pancreas: No mass or inflammatory changes identified on this unenhanced exam. Spleen: Within normal limits in size. Adrenals/Urinary Tract: No evidence of urolithiasis or hydronephrosis. Unremarkable appearance of bladder. Stomach/Bowel: No evidence of obstruction, inflammatory process, or abnormal fluid collections. Vascular/Lymphatic: No pathologically enlarged lymph nodes identified. No abdominal aortic aneurysm. Aortic atherosclerosis incidentally noted. Reproductive: Prior hysterectomy noted. Adnexal regions are unremarkable in appearance. Other:  None. Musculoskeletal: No suspicious bone lesions identified.  IMPRESSION: 1. Basilar predominant bilateral ill-defined pulmonary nodular opacities and airspace disease shows interval progression in the left lower lobe since prior exam. Absence of pleural effusions is atypical for lymphangitic carcinomatosis, and favors inflammatory or infectious etiologies. Consider further evaluation with bronchoscopy. 2. No evidence of abdominal or pelvic metastatic disease. 3. Cholelithiasis. No radiographic evidence of cholecystitis. Aortic Atherosclerosis (ICD10-I70.0). Electronically Signed   By: Marlaine Hind M.D.   On: 07/06/2019 16:27   CT Chest Wo Contrast  Result Date: 07/06/2019 CLINICAL DATA:  Follow-up right non-small cell lung carcinoma. Previous right lower lobectomy and chemotherapy. Ongoing immunotherapy. EXAM: CT CHEST, ABDOMEN AND PELVIS WITHOUT CONTRAST TECHNIQUE: Multidetector CT imaging of the chest,  abdomen and pelvis was performed following the standard protocol without IV contrast. COMPARISON:  04/15/2019 FINDINGS: CT CHEST FINDINGS Cardiovascular: No acute findings. Aortic atherosclerosis incidentally noted. Port-A-Cath remains in appropriate position. Mediastinum/Lymph Nodes: Scattered sub-cm mediastinal lymph nodes show no significant change. No pathologically enlarged lymph nodes identified on this unenhanced exam. Lungs/Pleura: Multiple small ill-defined nodular opacities and associated airspace disease is again seen in both lungs with bibasilar predominance. This shows interval progression in the left lower lobe with increased confluent airspace opacity. No evidence of associated pleural effusions. Musculoskeletal: No suspicious bone lesions identified. CT ABDOMEN AND PELVIS FINDINGS Hepatobiliary: No masses visualized on this unenhanced exam. A few tiny calcified gallstones are noted, however there is no evidence of cholecystitis or biliary ductal dilatation. Pancreas: No mass or inflammatory changes identified on this unenhanced exam. Spleen: Within normal limits in size. Adrenals/Urinary Tract: No evidence of urolithiasis or hydronephrosis. Unremarkable appearance of bladder. Stomach/Bowel: No evidence of obstruction, inflammatory process, or abnormal fluid collections. Vascular/Lymphatic: No pathologically enlarged lymph nodes identified. No abdominal aortic aneurysm. Aortic atherosclerosis incidentally noted. Reproductive: Prior hysterectomy noted. Adnexal regions are unremarkable in appearance. Other:  None. Musculoskeletal: No suspicious bone lesions identified. IMPRESSION: 1. Basilar predominant bilateral ill-defined pulmonary nodular opacities and airspace disease shows interval progression in the left lower lobe since prior exam. Absence of pleural effusions is atypical for lymphangitic carcinomatosis, and favors inflammatory or infectious etiologies. Consider further evaluation with  bronchoscopy. 2. No evidence of abdominal or pelvic metastatic disease. 3. Cholelithiasis. No radiographic evidence of cholecystitis. Aortic Atherosclerosis (ICD10-I70.0). Electronically Signed   By: Marlaine Hind M.D.   On: 07/06/2019 16:27     ASSESSMENT/PLAN:  This is a very pleasant 79 year old Caucasian female diagnosed with stage IV non-small cell lung cancer, well differentiated adenocarcinoma.  She presented with multifocal disease involving mainly the right upper lobe as well as right middle lobe and left lower lobe.  She was diagnosed in May 2019.  She has no actionable mutations.  She was undergoing treatment with carboplatin, Alimta, and Keytruda.  Starting from cycle #5 she started treatment with maintenance Alimta and Keytruda.  She requested to take a break after cycle #15.  She resumed her treatment with single agent Keytruda on December 2020 with cycle #16.  She is currently status post 22 cycles of treatment.  Pepcid was added to her treatment plan due to the 2 episodes of adverse reactions during treatment with Keytruda.  The patient recently had a restaging CT scan performed. Dr. [name] personally and independently reviewed the scan and discussed the results with the patient. The scan showed basilar predominant bilateral ill-defined pulmonary nodularopacities and airspace disease shows interval progression in the left lower lobe since prior exam.   Dr. Earlie Server recommended the patient hold treatment and that we placed the patient  on high-dose prednisone taper for suspicious immunotherapy pneumonitis.  I have sent a prescription to the patient's pharmacy for 80 mg p.o. daily of prednisone for 1 week, followed by 60 mg daily of prednisone for 1 week, followed by 40 mg daily of prednisone for 1 week, followed by 20 mg daily prednisone for 1 week followed by 10 mg daily of prednisone for 1 week.  The patient was advised to monitor her blood sugars closely as steroids can increase her  blood sugar.  If her blood sugar is elevated, the patient was advised to reach out to her primary care provider for recommendations for management.  The patient was encouraged to follow-up with Dr. Valeta Harms from pulmonology tomorrow as scheduled.  The patient was advised to call immediately if she has any concerning symptoms in the interval. The patient voices understanding of current disease status and treatment options and is in agreement with the current care plan. All questions were answered. The patient knows to call the clinic with any problems, questions or concerns. We can certainly see the patient much sooner if necessary    Orders Placed This Encounter  Procedures  . CT Chest W Contrast    Standing Status:   Future    Standing Expiration Date:   07/08/2020    Order Specific Question:   ** REASON FOR EXAM (FREE TEXT)    Answer:   Restaging lung cancer and suspicious pneumonitis    Order Specific Question:   If indicated for the ordered procedure, I authorize the administration of contrast media per Radiology protocol    Answer:   Yes    Order Specific Question:   Preferred imaging location?    Answer:   [facility]    Order Specific Question:   Radiology Contrast Protocol - do NOT remove file path    Answer:   [email or web address]     Claudell Rhody L Beauregard Jarrells, PA-C 07/09/19  ADDENDUM: Hematology/Oncology Attending: I had a face-to-face encounter with the patient today.  I recommended her care plan.  She has a very pleasant 79 years old white female with a stage IV non-small cell lung cancer, well differentiated adenocarcinoma.  She is status post systemic chemotherapy with carboplatin, Alimta and Keytruda and most recently has been treated with single agent Keytruda as maintenance therapy.  She is status post 22 cycles.  The patient has been tolerating this treatment well. She has been complaining of worsening dyspnea recently. She had repeat CT  scan of the chest, abdomen and pelvis.  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 I recommended for the patient to hold her current treatment with [facility] for now. I will start her on a tapered dose of prednisone initially at 1 mg/KG daily and this will be tapered over the next 6 weeks.  She was advised to monitor her blood sugar closely and to discuss with her primary care physician adjusting her medication during this time. We will arrange for the patient to come back for follow-up visit in 6 weeks with repeat CT scan of the chest for restaging of her disease. The patient and her husband agreed to the current plan. She was advised to call immediately if she has any concerning symptoms in the interval.  Disclaimer: This note was dictated with voice recognition software. Similar sounding words can inadvertently be transcribed and may be missed upon review. Eilleen Kempf, MD  07/09/19  

## 2019-07-09 ENCOUNTER — Encounter: Payer: Self-pay | Admitting: Physician Assistant

## 2019-07-09 ENCOUNTER — Inpatient Hospital Stay: Payer: PPO

## 2019-07-09 ENCOUNTER — Inpatient Hospital Stay (HOSPITAL_BASED_OUTPATIENT_CLINIC_OR_DEPARTMENT_OTHER): Payer: PPO | Admitting: Physician Assistant

## 2019-07-09 ENCOUNTER — Other Ambulatory Visit: Payer: Self-pay

## 2019-07-09 VITALS — BP 134/62 | HR 86 | Temp 98.2°F | Resp 18 | Ht 65.0 in | Wt 166.5 lb

## 2019-07-09 DIAGNOSIS — C3491 Malignant neoplasm of unspecified part of right bronchus or lung: Secondary | ICD-10-CM | POA: Diagnosis not present

## 2019-07-09 DIAGNOSIS — J189 Pneumonia, unspecified organism: Secondary | ICD-10-CM | POA: Diagnosis not present

## 2019-07-09 DIAGNOSIS — Z5112 Encounter for antineoplastic immunotherapy: Secondary | ICD-10-CM

## 2019-07-09 DIAGNOSIS — C3490 Malignant neoplasm of unspecified part of unspecified bronchus or lung: Secondary | ICD-10-CM

## 2019-07-09 DIAGNOSIS — I10 Essential (primary) hypertension: Secondary | ICD-10-CM

## 2019-07-09 DIAGNOSIS — Z95828 Presence of other vascular implants and grafts: Secondary | ICD-10-CM

## 2019-07-09 LAB — CBC WITH DIFFERENTIAL (CANCER CENTER ONLY)
Abs Immature Granulocytes: 0.07 10*3/uL (ref 0.00–0.07)
Basophils Absolute: 0.1 10*3/uL (ref 0.0–0.1)
Basophils Relative: 0 %
Eosinophils Absolute: 0.3 10*3/uL (ref 0.0–0.5)
Eosinophils Relative: 2 %
HCT: 43.2 % (ref 36.0–46.0)
Hemoglobin: 13.2 g/dL (ref 12.0–15.0)
Immature Granulocytes: 1 %
Lymphocytes Relative: 10 %
Lymphs Abs: 1.5 10*3/uL (ref 0.7–4.0)
MCH: 31.4 pg (ref 26.0–34.0)
MCHC: 30.6 g/dL (ref 30.0–36.0)
MCV: 102.6 fL — ABNORMAL HIGH (ref 80.0–100.0)
Monocytes Absolute: 1 10*3/uL (ref 0.1–1.0)
Monocytes Relative: 6 %
Neutro Abs: 12.4 10*3/uL — ABNORMAL HIGH (ref 1.7–7.7)
Neutrophils Relative %: 81 %
Platelet Count: 238 10*3/uL (ref 150–400)
RBC: 4.21 MIL/uL (ref 3.87–5.11)
RDW: 13.6 % (ref 11.5–15.5)
WBC Count: 15.3 10*3/uL — ABNORMAL HIGH (ref 4.0–10.5)
nRBC: 0 % (ref 0.0–0.2)

## 2019-07-09 LAB — CMP (CANCER CENTER ONLY)
ALT: 6 U/L (ref 0–44)
AST: 13 U/L — ABNORMAL LOW (ref 15–41)
Albumin: 3.1 g/dL — ABNORMAL LOW (ref 3.5–5.0)
Alkaline Phosphatase: 77 U/L (ref 38–126)
Anion gap: 10 (ref 5–15)
BUN: 15 mg/dL (ref 8–23)
CO2: 32 mmol/L (ref 22–32)
Calcium: 9.5 mg/dL (ref 8.9–10.3)
Chloride: 101 mmol/L (ref 98–111)
Creatinine: 1.15 mg/dL — ABNORMAL HIGH (ref 0.44–1.00)
GFR, Est AFR Am: 54 mL/min — ABNORMAL LOW (ref >60)
GFR, Estimated: 46 mL/min — ABNORMAL LOW (ref >60)
Glucose, Bld: 106 mg/dL — ABNORMAL HIGH (ref 70–99)
Potassium: 4.4 mmol/L (ref 3.5–5.1)
Sodium: 143 mmol/L (ref 135–145)
Total Bilirubin: 0.3 mg/dL (ref 0.3–1.2)
Total Protein: 7.6 g/dL (ref 6.5–8.1)

## 2019-07-09 LAB — TSH: TSH: 1 u[IU]/mL (ref 0.308–3.960)

## 2019-07-09 MED ORDER — PREDNISONE 20 MG PO TABS: ORAL_TABLET | ORAL | 0 refills | Status: DC

## 2019-07-09 MED ORDER — HEPARIN SOD (PORK) LOCK FLUSH 100 UNIT/ML IV SOLN
500.0000 [IU] | Freq: Once | INTRAVENOUS | Status: AC
  Administered 2019-07-09: 500 [IU] via INTRAVENOUS
  Filled 2019-07-09: qty 5

## 2019-07-09 MED ORDER — SODIUM CHLORIDE 0.9% FLUSH
10.0000 mL | INTRAVENOUS | Status: DC | PRN
  Administered 2019-07-09: 10 mL via INTRAVENOUS
  Filled 2019-07-09: qty 10

## 2019-07-09 MED ORDER — SODIUM CHLORIDE 0.9% FLUSH
10.0000 mL | INTRAVENOUS | Status: DC | PRN
  Administered 2019-07-09: 10 mL
  Filled 2019-07-09: qty 10

## 2019-07-09 MED ORDER — DIPHENHYDRAMINE HCL 50 MG/ML IJ SOLN
INTRAMUSCULAR | Status: AC
  Filled 2019-07-09: qty 1

## 2019-07-09 MED ORDER — FAMOTIDINE IN NACL 20-0.9 MG/50ML-% IV SOLN
INTRAVENOUS | Status: AC
  Filled 2019-07-09: qty 50

## 2019-07-09 MED ORDER — ACETAMINOPHEN 325 MG PO TABS
ORAL_TABLET | ORAL | Status: AC
  Filled 2019-07-09: qty 2

## 2019-07-10 ENCOUNTER — Ambulatory Visit: Payer: PPO | Admitting: Pulmonary Disease

## 2019-07-10 ENCOUNTER — Encounter: Payer: Self-pay | Admitting: Pulmonary Disease

## 2019-07-10 ENCOUNTER — Telehealth: Payer: Self-pay | Admitting: Internal Medicine

## 2019-07-10 VITALS — BP 188/52 | HR 88 | Temp 97.8°F | Ht 65.0 in | Wt 169.2 lb

## 2019-07-10 DIAGNOSIS — C3491 Malignant neoplasm of unspecified part of right bronchus or lung: Secondary | ICD-10-CM | POA: Diagnosis not present

## 2019-07-10 DIAGNOSIS — J8489 Other specified interstitial pulmonary diseases: Secondary | ICD-10-CM

## 2019-07-10 DIAGNOSIS — J9611 Chronic respiratory failure with hypoxia: Secondary | ICD-10-CM

## 2019-07-10 DIAGNOSIS — R059 Cough, unspecified: Secondary | ICD-10-CM

## 2019-07-10 DIAGNOSIS — Z86711 Personal history of pulmonary embolism: Secondary | ICD-10-CM

## 2019-07-10 DIAGNOSIS — J984 Other disorders of lung: Secondary | ICD-10-CM | POA: Diagnosis not present

## 2019-07-10 DIAGNOSIS — R05 Cough: Secondary | ICD-10-CM

## 2019-07-10 MED ORDER — BENZONATATE 200 MG PO CAPS: 200.0000 mg | ORAL_CAPSULE | Freq: Three times a day (TID) | ORAL | 1 refills | Status: DC | PRN

## 2019-07-10 MED ORDER — BREZTRI AEROSPHERE 160-9-4.8 MCG/ACT IN AERO
2.0000 | INHALATION_SPRAY | Freq: Two times a day (BID) | RESPIRATORY_TRACT | 6 refills | Status: DC
Start: 2019-07-10 — End: 2019-07-15

## 2019-07-10 MED ORDER — IPRATROPIUM-ALBUTEROL 0.5-2.5 (3) MG/3ML IN SOLN: RESPIRATORY_TRACT | 11 refills | Status: AC

## 2019-07-10 MED ORDER — BREZTRI AEROSPHERE 160-9-4.8 MCG/ACT IN AERO
2.0000 | INHALATION_SPRAY | Freq: Two times a day (BID) | RESPIRATORY_TRACT | 0 refills | Status: DC
Start: 2019-07-10 — End: 2019-07-15

## 2019-07-10 MED ORDER — BUDESONIDE-FORMOTEROL FUMARATE 160-4.5 MCG/ACT IN AERO: 2.0000 | INHALATION_SPRAY | Freq: Two times a day (BID) | RESPIRATORY_TRACT | 6 refills | Status: DC

## 2019-07-10 NOTE — Patient Instructions (Signed)
Thank you for visiting Dr. Valeta Harms at [facility]. Today we recommend the following:  Breztri Samples today   Meds ordered this encounter  Medications  . ipratropium-albuterol (DUONEB) 0.5-2.5 (3) MG/3ML SOLN    Sig: INHALE THE CONTENTS OF 1 VIAL VIA NEBULIZER 3 TIMES A DAY.    Dispense:  240 mL    Refill:  11    Dx j47.9  . benzonatate (TESSALON) 200 MG capsule    Sig: Take 1 capsule (200 mg total) by mouth 3 (three) times daily as needed for cough.    Dispense:  30 capsule    Refill:  1  . budesonide-formoterol (SYMBICORT) 160-4.5 MCG/ACT inhaler    Sig: Inhale 2 puffs into the lungs in the morning and at bedtime.    Dispense:  10.2 g    Refill:  6   Return in about 6 months (around 01/09/2020) for with APP or Dr. Valeta Harms.    Please do your part to reduce the spread of COVID-19.

## 2019-07-10 NOTE — Telephone Encounter (Signed)
Scheduled per los. Called and left msg. Mailed printout  °

## 2019-07-10 NOTE — Progress Notes (Signed)
Synopsis: Referred in September 2020 for establish care with new pulmonologist by [name], MD  Subjective:   PATIENT ID: Erin Myers GENDER: female DOB: 05-03-1940, MRN: [id number]  Chief Complaint  Patient presents with  . 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  Diagnosis of stage IV, T3, N0, M1 a, non-small cell lung cancer, well differentiated adenocarcinoma involving the right upper lobe, right middle lobe and left lower lobe of the lung diagnosed in May 2019.  Bilateral pulmonary emboli.  Patient of Dr. Earlie Server treated with carboplatinum, Alimta, Keytruda.  Currently on Xarelto.  Repeat CT imaging demonstrated multiple new enlarging pulmonary nodules concerning for pulmonary metastasis.  Last discussion with oncology included continuation of chemotherapy or repeat observation in 3 months.  Today she is doing well.  She does have posterior nasal drainage.  She has never used intranasal steroids before.  She occasionally has dryness in the right nose and will occasionally have bleeding.  Otherwise respiratory symptoms are stable.  She had does have dyspnea on exertion.  She uses her pulse oxygen tank regularly.  She would like to have a POC if possible.  This would help with her portability and travel.  OV 07/10/2019: Here today for follow-up.  Patient was last seen in her office on 05/30/2019 by Wyn Quaker, NP.  Patient's bronchiectasis flare was treated with Augmentin last office visit.  Here today for follow-up to see if her symptoms are improving.  Patient last seen yesterday in the oncology clinic.  At this time remains on Keytruda.  Patient had recent CT imaging for restaging.  This was concerning as it had bilateral opacities and airspace disease.  Dr. Earlie Server felt as if should treat for  pneumonitis potentially related to immune therapy.  Patient was started on prednisone.  Oncology office note from 07/09/2019 reviewed.  Today patient has no significant complaints except for her cough with sputum production.  She does note after restarting the Beryle Flock is when the symptoms restarted.  She had a few months off and then the decision was to restart and noticed that the symptoms have been insidious since then.   Past Medical History:  Diagnosis Date  . Anxiety   . Diabetes mellitus without complication ([place])   . Dyslipidemia   . HTN (hypertension)   . Hyperlipemia   . Hypothyroid   . Hypothyroidism   . Lung cancer ([place]) 2000   Carcinoid, s/p RLL lobectomy  . Obesity   . Ovarian cancer ([facility])   . Palpitations 2009 and 2006   Negative nuclear stress test   . Sleep apnea    on C-pap     Family History  Problem Relation Age of Onset  . Coronary artery disease Mother 39       died of an MI  . Heart attack Mother   . Suicidality Sister   . CVA Brother 70  . CVA Father      Past Surgical History:  Procedure Laterality Date  . ABDOMINAL HYSTERECTOMY  2001  . GRADED EXERCISE TOLERANCE TEST  08/2015    Blood pressure demonstrated a hypertensive response to exercise.  There was less than 0.77mm of horizontal ST segment depression in the inferolateral leads. No diagnostic criterial for ischemia. Moderately impaired exercise tolerance. The patient achieved 5.8 mets. This is a low risk study.  [name]  IR FLUORO GUIDE PORT INSERTION RIGHT  08/17/2017  . IR THORACENTESIS ASP PLEURAL SPACE W/IMG GUIDE  07/04/2018  . IR US GUIDE VASC ACCESS RIGHT  08/17/2017  . LOBECTOMY  2000   RLL  . NM MYOCAR PERF WALL MOTION  08/20/2007   EF 74%  EXERCISE CAPACITY 7 METS  . OOPHORECTOMY  2001  . TONSILLECTOMY    . VIDEO BRONCHOSCOPY Bilateral 07/20/2017   Procedure: VIDEO BRONCHOSCOPY WITH FLUORO;  Surgeon: Collene Gobble, MD;  Location: [facility];  Service: Cardiopulmonary;  Laterality: Bilateral;      Social History   Socioeconomic History  . Marital status: Married    Spouse name: Not on file  . Number of children: 2  . Years of education: HS  . Highest education level: Not on file  Occupational History  . Not on file  Tobacco Use  . Smoking status: Former Smoker    Packs/day: 0.75    Years: 30.00    Pack years: 22.50    Types: Cigarettes    Quit date: 08/18/1991    Years since quitting: 27.9  . Smokeless tobacco: Never Used  Substance and Sexual Activity  . Alcohol use: No    Alcohol/week: 0.0 standard drinks  . Drug use: No  . Sexual activity: Yes    Partners: Male  Other Topics Concern  . Not on file  Social History Narrative   1-2 glasses of tea a day    Married mother of 2 (daughter close to 26 and son 45-51 years old).  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   Social Determinants of Health   Financial Resource Strain:   . Difficulty of Paying Living Expenses:   Food Insecurity:   . Worried About Charity fundraiser in the Last Year:   . Arboriculturist in the Last Year:   Transportation Needs:   . Film/video editor (Medical):   [name] Lack of Transportation (Non-Medical):   Physical Activity:   . Days of Exercise per Week:   . Minutes of Exercise per Session:   Stress:   . Feeling of Stress :   Social Connections:   . Frequency of Communication with Friends and Family:   . Frequency of Social Gatherings with Friends and Family:   . Attends Religious Services:   . Active Member of Clubs or Organizations:   . Attends Archivist Meetings:   [name] Marital Status:   Intimate Partner Violence:   . Fear of Current or Ex-Partner:   . Emotionally Abused:   [name] Physically Abused:   . Sexually Abused:      Allergies  Allergen Reactions  . Pembrolizumab Anaphylaxis  . Adhesive [Tape] Other (See Comments)    Irritates skin   . Codeine Other (See Comments)    Skin crawlinf feeling   . Lactose Intolerance (Gi) Diarrhea  . Monosodium Glutamate Diarrhea  . Sulfa Antibiotics Nausea And Vomiting     Outpatient Medications Prior to Visit  Medication Sig Dispense Refill  . acetaminophen (TYLENOL) 500 MG tablet Take 500 mg by mouth every 6 (six) hours as needed.    . cholecalciferol (VITAMIN D3) 25 MCG (1000 UT) tablet Take 1,000 Units by mouth daily.    . clorazepate (TRANXENE) 7.5 MG tablet Take 7.5 mg by mouth 2 (two) times daily as needed for anxiety.    . docusate sodium (COLACE) 100 MG capsule Take 100  mg by mouth 2 (two) times daily.    [name] ipratropium-albuterol (DUONEB) 0.5-2.5 (3) MG/3ML SOLN INHALE THE CONTENTS OF 1 VIAL VIA NEBULIZER 3 TIMES A DAY. 120 mL 11  . levothyroxine (SYNTHROID) 112 MCG tablet Take 112 mcg by mouth daily.    [name] lidocaine-prilocaine (EMLA) cream Apply 1 application topically as needed. 30 g 0  . metoprolol succinate (TOPROL-XL) 50 MG 24 hr tablet Take 50 mg by mouth 2 (two) times daily.     . Multiple Vitamins-Minerals (VISION FORMULA 2 PO) Take by mouth. OTC "Vision pills for Macular degeneration"    . predniSONE (DELTASONE) 20 MG tablet Please take 4 tablets (80 mg) by mouth daily for 1 week, followed by 3 tablets (60 mg) daily for 1 week, followed by 2 tablets (70 mg) daily for 1 week, followed by 1 tablet (20 mg), followed by 1/2 a tablet for 1 week 75 tablet 0  . rosuvastatin (CRESTOR) 10 MG tablet Take 10 mg by mouth every evening.     . SYMBICORT 160-4.5 MCG/ACT inhaler INHALE 2 PUFFS INTO THE LUNGS 2 TIMES DAILY. 10.2 g 6  . valACYclovir (VALTREX) 500 MG tablet Take 500 mg by mouth daily.    [name] 20 MG TABS tablet TAKE 1 TABLET BY MOUTH DAILY WITH SUPPER. 30 tablet 1  . benzonatate (TESSALON) 200 MG capsule Take 1 capsule (200 mg total) by mouth 3 (three) times daily as needed for cough. (Patient not taking: Reported on 07/10/2019) 30 capsule 1   No facility-administered medications prior to visit.    Review of Systems   Constitutional: Negative for chills, fever, malaise/fatigue and weight loss.  HENT: Negative for hearing loss, sore throat and tinnitus.   Eyes: Negative for blurred vision and double vision.  Respiratory: Positive for cough and shortness of breath. Negative for hemoptysis, sputum production, wheezing and stridor.   Cardiovascular: Negative for chest pain, palpitations, orthopnea, leg swelling and PND.  Gastrointestinal: Negative for abdominal pain, constipation, diarrhea, heartburn, nausea and vomiting.  Genitourinary: Negative for dysuria, hematuria and urgency.  Musculoskeletal: Negative for joint pain and myalgias.  Skin: Negative for itching and rash.  Neurological: Negative for dizziness, tingling, weakness and headaches.  Endo/Heme/Allergies: Negative for environmental allergies. Does not bruise/bleed easily.  Psychiatric/Behavioral: Negative for depression. The patient is not nervous/anxious and does not have insomnia.   All other systems reviewed and are negative.    Objective:  Physical Exam Vitals reviewed.  Constitutional:      General: She is not in acute distress.    Appearance: She is well-developed.  HENT:     Head: Normocephalic and atraumatic.  Eyes:     General: No scleral icterus.    Conjunctiva/sclera: Conjunctivae normal.     Pupils: Pupils are equal, round, and reactive to light.  Neck:     Vascular: No JVD.     Trachea: No tracheal deviation.  Cardiovascular:     Rate and Rhythm: Normal rate and regular rhythm.     Heart sounds: Normal heart sounds. No murmur.  Pulmonary:     Effort: Pulmonary effort is normal. No tachypnea, accessory muscle usage or respiratory distress.     Breath sounds: No stridor. Rhonchi present. No wheezing or rales.  Musculoskeletal:        General: No tenderness.     Cervical back: Neck supple.  Lymphadenopathy:     Cervical: No cervical adenopathy.  Skin:    General: Skin is warm and dry.  Capillary Refill: Capillary  refill takes less than 2 seconds.     Findings: No rash.  Neurological:     Mental Status: She is alert and oriented to person, place, and time.  Psychiatric:        Behavior: Behavior normal.      Vitals:   07/10/19 1207  BP: (!) 188/52  Pulse: 88  Temp: 97.8 F (36.6 C)  TempSrc: Temporal  SpO2: 92%  Weight: 169 lb 3.2 oz (76.7 kg)  Height: 5\' 5"  (1.651 m)   92% on 2 liters pulse BMI Readings from Last 3 Encounters:  07/10/19 28.16 kg/m  07/09/19 27.71 kg/m  06/17/19 28.49 kg/m   Wt Readings from Last 3 Encounters:  07/10/19 169 lb 3.2 oz (76.7 kg)  07/09/19 166 lb 8 oz (75.5 kg)  06/17/19 171 lb 3.2 oz (77.7 kg)     CBC    Component Value Date/Time   WBC 15.3 (H) 07/09/2019 0843   WBC 15.2 (H) 08/17/2017 1155   RBC 4.21 07/09/2019 0843   HGB 13.2 07/09/2019 0843   HCT 43.2 07/09/2019 0843   PLT 238 07/09/2019 0843   MCV 102.6 (H) 07/09/2019 0843   MCH 31.4 07/09/2019 0843   MCHC 30.6 07/09/2019 0843   RDW 13.6 07/09/2019 0843   LYMPHSABS 1.5 07/09/2019 0843   MONOABS 1.0 07/09/2019 0843   EOSABS 0.3 07/09/2019 0843   BASOSABS 0.1 07/09/2019 0843    Chest Imaging: August 2020 CT imaging: Enlarging bilateral pulmonary nodules concerning for pulmonary metastasis small right-sided pleural effusion. The patient's images have been independently reviewed by me.    Pulmonary Functions Testing Results: No flowsheet data found.  FeNO: None   Pathology: None   Echocardiogram:  05/24/2017 - Normal EF, PA Peak 41 mmHg   Heart Catheterization: None     Assessment & Plan:     ICD-10-CM   1. Chronic hypoxemic respiratory failure (HCC)  J96.11   2. Adenocarcinoma of right lung, stage 4 (HCC)  C34.91   3. History of pulmonary embolism  Z86.711   4. 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 Drug-induced lung disease  J98.4    T50.905A     Discussion:  This is a 79 year old female chronic hypoxemic respiratory failure in the setting of stage IV  lung cancer currently undergoing immunotherapy status post chemo.  Assessment:   This is a 79 year old with advanced stage lung cancer, undergoing immunotherapy now with concern for complication related to immune therapy to include pneumonitis interstitial pneumonitis drug-induced lung disease on top of her baseline chronic hypoxemic respiratory failure.  Plan Following Extensive Data Review & Interpretation:  . I reviewed prior external note(s) from Dr. Lew Dawes office 07/09/2019 . I reviewed the result(s) of CT abdomen pelvis, CT chest from April 2021 . I have ordered refills for duo nebs, Tessalon Perles as well as Symbicort. Patient given sample of breztri today   Independent interpretation of tests . Review of patient's CT chest April 2021 images revealed bilateral nodular airspace disease with associated areas of groundglass which appear inflammatory to me. The patient's images have been independently reviewed by me.    Patient return to clinic in 6 months or as needed based on symptoms.  If she continues to have decline or her no improvement in respiratory status on prednisone for treatment of potential drug-induced pneumonitis.  May need to consider other etiologies of abnormality seen on CT.  She has a repeat noncontrasted CT image already ordered by oncology to  be completed in June.    Current Outpatient Medications:  .  acetaminophen (TYLENOL) 500 MG tablet, Take 500 mg by mouth every 6 (six) hours as needed., Disp: , Rfl:  .  cholecalciferol (VITAMIN D3) 25 MCG (1000 UT) tablet, Take 1,000 Units by mouth daily., Disp: , Rfl:  .  clorazepate (TRANXENE) 7.5 MG tablet, Take 7.5 mg by mouth 2 (two) times daily as needed for anxiety., Disp: , Rfl:  .  docusate sodium (COLACE) 100 MG capsule, Take 100 mg by mouth 2 (two) times daily., Disp: , Rfl:  .  ipratropium-albuterol (DUONEB) 0.5-2.5 (3) MG/3ML SOLN, INHALE THE CONTENTS OF 1 VIAL VIA NEBULIZER 3 TIMES A DAY., Disp: 120 mL, Rfl:  11 .  levothyroxine (SYNTHROID) 112 MCG tablet, Take 112 mcg by mouth daily., Disp: , Rfl:  .  lidocaine-prilocaine (EMLA) cream, Apply 1 application topically as needed., Disp: 30 g, Rfl: 0 .  metoprolol succinate (TOPROL-XL) 50 MG 24 hr tablet, Take 50 mg by mouth 2 (two) times daily. , Disp: , Rfl:  .  Multiple Vitamins-Minerals (VISION FORMULA 2 PO), Take by mouth. OTC "Vision pills for Macular degeneration", Disp: , Rfl:  .  predniSONE (DELTASONE) 20 MG tablet, Please take 4 tablets (80 mg) by mouth daily for 1 week, followed by 3 tablets (60 mg) daily for 1 week, followed by 2 tablets (70 mg) daily for 1 week, followed by 1 tablet (20 mg), followed by 1/2 a tablet for 1 week, Disp: 75 tablet, Rfl: 0 .  rosuvastatin (CRESTOR) 10 MG tablet, Take 10 mg by mouth every evening. , Disp: , Rfl:  .  SYMBICORT 160-4.5 MCG/ACT inhaler, INHALE 2 PUFFS INTO THE LUNGS 2 TIMES DAILY., Disp: 10.2 g, Rfl: 6 .  valACYclovir (VALTREX) 500 MG tablet, Take 500 mg by mouth daily., Disp: , Rfl:  .  XARELTO 20 MG TABS tablet, TAKE 1 TABLET BY MOUTH DAILY WITH SUPPER., Disp: 30 tablet, Rfl: 1 .  benzonatate (TESSALON) 200 MG capsule, Take 1 capsule (200 mg total) by mouth 3 (three) times daily as needed for cough. (Patient not taking: Reported on 07/10/2019), Disp: 30 capsule, Rfl: 1   Garner Nash, DO [facility] 07/10/2019 12:21 PM

## 2019-07-10 NOTE — Addendum Note (Signed)
Addended by: Gavin Potters R on: 07/10/2019 02:38 PM   Modules accepted: Orders

## 2019-07-11 ENCOUNTER — Ambulatory Visit: Payer: PPO | Admitting: Pulmonary Disease

## 2019-07-12 ENCOUNTER — Telehealth: Payer: Self-pay | Admitting: Pulmonary Disease

## 2019-07-12 NOTE — Telephone Encounter (Signed)
Continue symbicort  BLI

## 2019-07-12 NOTE — Telephone Encounter (Signed)
Received a PA request for pt's Breztri inhaler.  Preferred alternatives: spiriva handihaler/respimat, tudorza, incruse, advair diskus, breo, symbicort, stiolto, anoro, trelegy, striverdi, or serevent.  Pt currently has symbicort listed on med list along with the breztri.  Dr. Valeta Harms, please advise if you want to keep pt on the symbicort and add another inhaler with it or what you recommend.

## 2019-07-15 NOTE — Telephone Encounter (Signed)
Spoke with pt, advised her that BI wanted her to resume Symbicort since her insurance will not cover [employer]. Pt agreed. Nothing further is needed.

## 2019-07-16 ENCOUNTER — Encounter: Payer: Self-pay | Admitting: Internal Medicine

## 2019-07-17 DIAGNOSIS — E1151 Type 2 diabetes mellitus with diabetic peripheral angiopathy without gangrene: Secondary | ICD-10-CM | POA: Diagnosis not present

## 2019-07-18 ENCOUNTER — Telehealth: Payer: Self-pay | Admitting: Pulmonary Disease

## 2019-07-18 NOTE — Telephone Encounter (Signed)
Called cover my meds at [phone] to see what prior Josem Kaufmann was about. They stated it was for [facility].  There is an office note from 5/3: Spoke with pt, advised her that BI wanted her to resume Symbicort since her insurance will not cover [employer]. Pt agreed. Nothing further is needed.   They are going to close out the prior auth since that patient is staying on Symbicort. Nothing further needed at this time

## 2019-07-29 ENCOUNTER — Ambulatory Visit: Payer: PPO

## 2019-07-29 ENCOUNTER — Other Ambulatory Visit: Payer: PPO

## 2019-07-29 ENCOUNTER — Ambulatory Visit: Payer: PPO | Admitting: Internal Medicine

## 2019-08-07 DIAGNOSIS — J449 Chronic obstructive pulmonary disease, unspecified: Secondary | ICD-10-CM | POA: Diagnosis not present

## 2019-08-16 ENCOUNTER — Ambulatory Visit (HOSPITAL_COMMUNITY)
Admission: RE | Admit: 2019-08-16 | Discharge: 2019-08-16 | Disposition: A | Discharge status: Home / Self Care | Payer: PPO | Source: Ambulatory Visit | Attending: Internal Medicine | Admitting: Internal Medicine

## 2019-08-16 ENCOUNTER — Other Ambulatory Visit: Payer: Self-pay

## 2019-08-16 DIAGNOSIS — C3490 Malignant neoplasm of unspecified part of unspecified bronchus or lung: Secondary | ICD-10-CM | POA: Diagnosis not present

## 2019-08-19 ENCOUNTER — Ambulatory Visit: Payer: PPO

## 2019-08-19 ENCOUNTER — Ambulatory Visit: Payer: PPO | Admitting: Physician Assistant

## 2019-08-19 ENCOUNTER — Other Ambulatory Visit: Payer: PPO

## 2019-08-21 ENCOUNTER — Encounter: Payer: Self-pay | Admitting: Internal Medicine

## 2019-08-21 ENCOUNTER — Inpatient Hospital Stay: Payer: PPO | Attending: Internal Medicine | Admitting: Internal Medicine

## 2019-08-21 ENCOUNTER — Other Ambulatory Visit: Payer: Self-pay

## 2019-08-21 ENCOUNTER — Inpatient Hospital Stay: Payer: PPO

## 2019-08-21 VITALS — BP 122/51 | HR 88 | Temp 97.7°F | Resp 17 | Ht 65.0 in | Wt 173.4 lb

## 2019-08-21 DIAGNOSIS — R0602 Shortness of breath: Secondary | ICD-10-CM | POA: Insufficient documentation

## 2019-08-21 DIAGNOSIS — Z79899 Other long term (current) drug therapy: Secondary | ICD-10-CM | POA: Diagnosis not present

## 2019-08-21 DIAGNOSIS — M6281 Muscle weakness (generalized): Secondary | ICD-10-CM | POA: Diagnosis not present

## 2019-08-21 DIAGNOSIS — R0609 Other forms of dyspnea: Secondary | ICD-10-CM | POA: Insufficient documentation

## 2019-08-21 DIAGNOSIS — Z86711 Personal history of pulmonary embolism: Secondary | ICD-10-CM | POA: Insufficient documentation

## 2019-08-21 DIAGNOSIS — Z90721 Acquired absence of ovaries, unilateral: Secondary | ICD-10-CM | POA: Diagnosis not present

## 2019-08-21 DIAGNOSIS — Z5112 Encounter for antineoplastic immunotherapy: Secondary | ICD-10-CM | POA: Diagnosis not present

## 2019-08-21 DIAGNOSIS — R5383 Other fatigue: Secondary | ICD-10-CM | POA: Insufficient documentation

## 2019-08-21 DIAGNOSIS — Z882 Allergy status to sulfonamides status: Secondary | ICD-10-CM | POA: Diagnosis not present

## 2019-08-21 DIAGNOSIS — Z885 Allergy status to narcotic agent status: Secondary | ICD-10-CM | POA: Diagnosis not present

## 2019-08-21 DIAGNOSIS — Z8543 Personal history of malignant neoplasm of ovary: Secondary | ICD-10-CM | POA: Diagnosis not present

## 2019-08-21 DIAGNOSIS — C3411 Malignant neoplasm of upper lobe, right bronchus or lung: Secondary | ICD-10-CM | POA: Diagnosis not present

## 2019-08-21 DIAGNOSIS — Z7952 Long term (current) use of systemic steroids: Secondary | ICD-10-CM | POA: Diagnosis not present

## 2019-08-21 DIAGNOSIS — C3491 Malignant neoplasm of unspecified part of right bronchus or lung: Secondary | ICD-10-CM | POA: Diagnosis not present

## 2019-08-21 DIAGNOSIS — I700 Atherosclerosis of aorta: Secondary | ICD-10-CM | POA: Insufficient documentation

## 2019-08-21 DIAGNOSIS — I10 Essential (primary) hypertension: Secondary | ICD-10-CM | POA: Insufficient documentation

## 2019-08-21 DIAGNOSIS — Z7901 Long term (current) use of anticoagulants: Secondary | ICD-10-CM | POA: Insufficient documentation

## 2019-08-21 DIAGNOSIS — Z888 Allergy status to other drugs, medicaments and biological substances status: Secondary | ICD-10-CM | POA: Diagnosis not present

## 2019-08-21 LAB — CMP (CANCER CENTER ONLY)
ALT: 8 U/L (ref 0–44)
AST: 11 U/L — ABNORMAL LOW (ref 15–41)
Albumin: 2.8 g/dL — ABNORMAL LOW (ref 3.5–5.0)
Alkaline Phosphatase: 58 U/L (ref 38–126)
Anion gap: 11 (ref 5–15)
BUN: 21 mg/dL (ref 8–23)
CO2: 30 mmol/L (ref 22–32)
Calcium: 9.4 mg/dL (ref 8.9–10.3)
Chloride: 103 mmol/L (ref 98–111)
Creatinine: 1.43 mg/dL — ABNORMAL HIGH (ref 0.44–1.00)
GFR, Est AFR Am: 41 mL/min — ABNORMAL LOW (ref >60)
GFR, Estimated: 35 mL/min — ABNORMAL LOW (ref >60)
Glucose, Bld: 129 mg/dL — ABNORMAL HIGH (ref 70–99)
Potassium: 4.1 mmol/L (ref 3.5–5.1)
Sodium: 144 mmol/L (ref 135–145)
Total Bilirubin: 0.3 mg/dL (ref 0.3–1.2)
Total Protein: 6.8 g/dL (ref 6.5–8.1)

## 2019-08-21 LAB — CBC WITH DIFFERENTIAL (CANCER CENTER ONLY)
Abs Immature Granulocytes: 0.09 10*3/uL — ABNORMAL HIGH (ref 0.00–0.07)
Basophils Absolute: 0.1 10*3/uL (ref 0.0–0.1)
Basophils Relative: 0 %
Eosinophils Absolute: 0.1 10*3/uL (ref 0.0–0.5)
Eosinophils Relative: 1 %
HCT: 42.5 % (ref 36.0–46.0)
Hemoglobin: 13.1 g/dL (ref 12.0–15.0)
Immature Granulocytes: 1 %
Lymphocytes Relative: 13 %
Lymphs Abs: 1.6 10*3/uL (ref 0.7–4.0)
MCH: 31.8 pg (ref 26.0–34.0)
MCHC: 30.8 g/dL (ref 30.0–36.0)
MCV: 103.2 fL — ABNORMAL HIGH (ref 80.0–100.0)
Monocytes Absolute: 0.7 10*3/uL (ref 0.1–1.0)
Monocytes Relative: 6 %
Neutro Abs: 9.8 10*3/uL — ABNORMAL HIGH (ref 1.7–7.7)
Neutrophils Relative %: 79 %
Platelet Count: 211 10*3/uL (ref 150–400)
RBC: 4.12 MIL/uL (ref 3.87–5.11)
RDW: 14.8 % (ref 11.5–15.5)
WBC Count: 12.4 10*3/uL — ABNORMAL HIGH (ref 4.0–10.5)
nRBC: 0 % (ref 0.0–0.2)

## 2019-08-21 LAB — TSH: TSH: 3.175 u[IU]/mL (ref 0.308–3.960)

## 2019-08-21 NOTE — Progress Notes (Signed)
[place] [phone]   [phone]  OFFICE PROGRESS NOTE  Erin Battles, MD [place] [address]  DIAGNOSIS:  1) Stage IV (T3, N0, M1 a)non-small cell lung cancer, well-differentiated adenocarcinoma presented with multifocal disease involving mainly the right upper lobe as well as the right middle lobe and left lower lobe of the lung diagnosed in May 2019. 2) bilateral small pulmonary emboli diagnosed incidentally on CT scan of the chest on October 16, 2017  Guardant 360 testing was negative for actionable mutations.  PRIOR THERAPY:None  CURRENT THERAPY: 1) Carboplatin for an AUC of 5, Alimta 500 mg/m2 and Keytruda 200 mg IV given every 3 weeks. First dosegiven on 08/18/2017.Status post 22 cycles.  Starting from cycle #5 the patient is on maintenance Alimta and Ketruda (pembrolizumab) every 3 weeks.  Her treatment has been on hold for several months. She will start treatment with single agent Keytruda on February 12, 2019. 2) Xarelto 15 mg p.o. twice daily for the first 3 weeks followed by 20 mg p.o. Daily.  INTERVAL HISTORY: Erin Myers 79 y.o. female returns to the clinic today for follow-up visit accompanied by her husband. The patient is feeling fine today except for the baseline shortness of breath. She has been on a tapered dose of prednisone for the last 6 weeks. She felt much better during that time. She denied having any current chest pain but has mild cough with no hemoptysis. She denied having any fever or chills. She has no nausea, vomiting, diarrhea or constipation. She has no headache or visual changes. She is here today for evaluation with repeat CT scan of the chest for restaging of her disease. MEDICAL HISTORY: Past Medical History:  Diagnosis Date  . Anxiety   . Diabetes mellitus without complication ([place])   . Dyslipidemia   . HTN (hypertension)   . Hyperlipemia   . Hypothyroid   .  Hypothyroidism   . Lung cancer ([place]) 2000   Carcinoid, s/p RLL lobectomy  . Obesity   . Ovarian cancer ([place])   . Palpitations 2009 and 2006   Negative nuclear stress test   . Sleep apnea    on C-pap    ALLERGIES:  is allergic to pembrolizumab; adhesive [tape]; codeine; lactose intolerance (gi); monosodium glutamate; and sulfa antibiotics.  MEDICATIONS:  Current Outpatient Medications  Medication Sig Dispense Refill  . acetaminophen (TYLENOL) 500 MG tablet Take 500 mg by mouth every 6 (six) hours as needed.    . benzonatate (TESSALON) 200 MG capsule Take 1 capsule (200 mg total) by mouth 3 (three) times daily as needed for cough. 30 capsule 1  . budesonide-formoterol (SYMBICORT) 160-4.5 MCG/ACT inhaler Inhale 2 puffs into the lungs in the morning and at bedtime. 10.2 g 6  . cholecalciferol (VITAMIN D3) 25 MCG (1000 UT) tablet Take 1,000 Units by mouth daily.    . clorazepate (TRANXENE) 7.5 MG tablet Take 7.5 mg by mouth 2 (two) times daily as needed for anxiety.    . docusate sodium (COLACE) 100 MG capsule Take 100 mg by mouth 2 (two) times daily.    [name] ipratropium-albuterol (DUONEB) 0.5-2.5 (3) MG/3ML SOLN INHALE THE CONTENTS OF 1 VIAL VIA NEBULIZER 3 TIMES A DAY. 240 mL 11  . levothyroxine (SYNTHROID) 112 MCG tablet Take 112 mcg by mouth daily.    [name] lidocaine-prilocaine (EMLA) cream Apply 1 application topically as needed. 30 g 0  . metoprolol succinate (TOPROL-XL) 50 MG 24 hr  tablet Take 50 mg by mouth 2 (two) times daily.     . Multiple Vitamins-Minerals (VISION FORMULA 2 PO) Take by mouth. OTC "Vision pills for Macular degeneration"    . predniSONE (DELTASONE) 20 MG tablet Please take 4 tablets (80 mg) by mouth daily for 1 week, followed by 3 tablets (60 mg) daily for 1 week, followed by 2 tablets (70 mg) daily for 1 week, followed by 1 tablet (20 mg), followed by 1/2 a tablet for 1 week 75 tablet 0  . rosuvastatin (CRESTOR) 10 MG tablet Take 10 mg by mouth every evening.     .  valACYclovir (VALTREX) 500 MG tablet Take 500 mg by mouth daily.    [name] 20 MG TABS tablet TAKE 1 TABLET BY MOUTH DAILY WITH SUPPER. 30 tablet 1   No current facility-administered medications for this visit.    SURGICAL HISTORY:  Past Surgical History:  Procedure Laterality Date  . ABDOMINAL HYSTERECTOMY  2001  . GRADED EXERCISE TOLERANCE TEST  08/2015    Blood pressure demonstrated a hypertensive response to exercise.  There was less than 0.74mm of horizontal ST segment depression in the inferolateral leads. No diagnostic criterial for ischemia. Moderately impaired exercise tolerance. The patient achieved 5.8 mets. This is a low risk study.  . IR FLUORO GUIDE PORT INSERTION RIGHT  08/17/2017  . IR THORACENTESIS ASP PLEURAL SPACE W/IMG GUIDE  07/04/2018  . IR US GUIDE VASC ACCESS RIGHT  08/17/2017  . LOBECTOMY  2000   RLL  . NM MYOCAR PERF WALL MOTION  08/20/2007   EF 74%  EXERCISE CAPACITY 7 METS  . OOPHORECTOMY  2001  . TONSILLECTOMY    . VIDEO BRONCHOSCOPY Bilateral 07/20/2017   Procedure: VIDEO BRONCHOSCOPY WITH FLUORO;  Surgeon: Collene Gobble, MD;  Location: [facility];  Service: Cardiopulmonary;  Laterality: Bilateral;    REVIEW OF SYSTEMS:  Constitutional: positive for fatigue Eyes: negative Ears, nose, mouth, throat, and face: negative Respiratory: positive for cough and dyspnea on exertion Cardiovascular: negative Gastrointestinal: negative Genitourinary:negative Integument/breast: negative Hematologic/lymphatic: negative Musculoskeletal:positive for muscle weakness Neurological: negative Behavioral/Psych: negative Endocrine: negative Allergic/Immunologic: negative   PHYSICAL EXAMINATION: General appearance: alert, cooperative, fatigued and no distress Head: Normocephalic, without obvious abnormality, atraumatic Neck: no adenopathy, no JVD, supple, symmetrical, trachea midline and thyroid not enlarged, symmetric, no tenderness/mass/nodules Lymph nodes: Cervical,  supraclavicular, and axillary nodes normal. Resp: clear to auscultation bilaterally Back: symmetric, no curvature. ROM normal. No CVA tenderness. Cardio: regular rate and rhythm, S1, S2 normal, no murmur, click, rub or gallop GI: soft, non-tender; bowel sounds normal; no masses,  no organomegaly Extremities: extremities normal, atraumatic, no cyanosis or edema Neurologic: Alert and oriented X 3, normal strength and tone. Normal symmetric reflexes. Normal coordination and gait  ECOG PERFORMANCE STATUS: 1 - Symptomatic but completely ambulatory  Blood pressure (!) 122/51, pulse 88, temperature 97.7 F (36.5 C), temperature source Temporal, resp. rate 17, height 5\' 5"  (1.651 m), weight 173 lb 6.4 oz (78.7 kg), SpO2 92 %.  LABORATORY DATA: Lab Results  Component Value Date   WBC 12.4 (H) 08/21/2019   HGB 13.1 08/21/2019   HCT 42.5 08/21/2019   MCV 103.2 (H) 08/21/2019   PLT 211 08/21/2019      Chemistry      Component Value Date/Time   NA 143 07/09/2019 0843   K 4.4 07/09/2019 0843   CL 101 07/09/2019 0843   CO2 32 07/09/2019 0843   BUN 15 07/09/2019 0843   CREATININE 1.15 (  H) 07/09/2019 0843      Component Value Date/Time   CALCIUM 9.5 07/09/2019 0843   ALKPHOS 77 07/09/2019 0843   AST 13 (L) 07/09/2019 0843   ALT 6 07/09/2019 0843   BILITOT 0.3 07/09/2019 0843       RADIOGRAPHIC STUDIES: CT Chest Wo Contrast  Result Date: 08/16/2019 CLINICAL DATA:  Non-small cell lung cancer staging. RIGHT lung cancer diagnosed in 2018. History of partial lung resection in the RIGHT chest EXAM: CT CHEST WITHOUT CONTRAST TECHNIQUE: Multidetector CT imaging of the chest was performed following the standard protocol without IV contrast. COMPARISON:  Multiple prior studies most recent study from 07/05/2019. FINDINGS: Cardiovascular: Calcified atheromatous plaque in the thoracic aorta. No aneurysm. Normal heart size. No pericardial effusion. RIGHT-sided Port-A-Cath terminating in the upper RIGHT  atrium. Central pulmonary vasculature with stable engorgement slightly greater than 3 cm. Mediastinum/Nodes: Small lymph nodes throughout the chest are stable dating back to February of 2021 no axillary lymphadenopathy. No thoracic inlet adenopathy. Lungs/Pleura: Diffuse ground-glass and interstitial thickening. Signs of bronchiectasis within the RIGHT upper lobe. Bronchial dilation also in the RIGHT middle lobe. Process appears increased in the RIGHT middle and upper lobe when compared to the prior study with similar appearance of dense consolidative changes in the dependent portions of the chest and in the lower lobes bilaterally. Associated confluent airspace disease in the dependent aspect of the lower lower chest bilaterally and along the major fissure in the LEFT chest with similar appearance. Airways are patent. Upper Abdomen: Cholelithiasis. Adrenal glands are normal. Renal cortical scarring partially imaged. No acute upper abdominal process. Musculoskeletal: No acute bone finding or destructive bone process. IMPRESSION: 1. Diffuse interstitial and airspace process with fibrotic component, slightly increased in the RIGHT upper lobe with respect to ground-glass and septal thickening. Consolidative changes in the lower chest bilaterally with similar appearance. Findings may be due to a combination of pneumonitis in the setting of prior therapy. The possibility of residual disease and lymphangitic tumor is also considered. Correlate with any current therapy and with PET as warranted. 2. Stable small lymph nodes throughout the chest. 3. Stable dilation of central pulmonary vasculature could represent changes of pulmonary arterial hypertension. 4. Aortic atherosclerosis. Aortic Atherosclerosis (ICD10-I70.0). Electronically Signed   By: Zetta Bills M.D.   On: 08/16/2019 15:37     ASSESSMENT AND PLAN: This is a very pleasant 79 years old white female with stage IV non-small cell lung cancer, adenocarcinoma  with no actionable mutations. She is currently undergoing systemic chemotherapy with carboplatin, Alimta and Ketruda (pembrolizumab) status post 15 cycles.  Starting from cycle #5 she is on maintenance treatment with Alimta and Ketruda (pembrolizumab). She has been in observation for several months with no treatment. Imaging studies showed evidence for disease progression. The patient resumed treatment with single agent Keytruda status post 7 cycles.  Her first cycle was started on February 12, 2019. The patient has been off treatment for the last 6 weeks because of the suspicious immunotherapy mediated pneumonitis. She was treated with a tapered dose of prednisone and she felt much better during that time. She had repeat CT scan of the chest performed recently. I personally and independently reviewed the scans and discussed the results with the patient and her husband. Her scan showed no concerning findings for disease progression. I recommended for the patient to resume her treatment with [facility] and she will start cycle #23 next week. For the COPD she will continue her care under Dr. Valeta Harms. For the  history of pulmonary embolism the patient will continue her current treatment with Xarelto. She will come back for follow-up visit in 4 weeks for evaluation before the next cycle of her treatment. She was advised to call immediately if he has any concerning symptoms in the interval.  The patient voices understanding of current disease status and treatment options and is in agreement with the current care plan. All questions were answered. The patient knows to call the clinic with any problems, questions or concerns. We can certainly see the patient much sooner if necessary.  Disclaimer: This note was dictated with voice recognition software. Similar sounding words can inadvertently be transcribed and may not be corrected upon review.

## 2019-08-23 ENCOUNTER — Telehealth: Payer: Self-pay | Admitting: Internal Medicine

## 2019-08-23 NOTE — Telephone Encounter (Signed)
Scheduled per los. Called and spoke with patient. Confirmed appt 

## 2019-08-27 ENCOUNTER — Telehealth: Payer: Self-pay | Admitting: Pulmonary Disease

## 2019-08-27 NOTE — Telephone Encounter (Signed)
Please get in for an acute visit with APP.  Ok to start on prednisone 20mg  daily until seen in clinic.  Thanks Garner Nash, DO [facility] 08/27/2019 3:17 PM

## 2019-08-27 NOTE — Telephone Encounter (Signed)
Erin Myers,  Looks like you are seeing her tomorrow for restart of keytruda.  She is still having symptoms. Thoughts? [facility] 08/27/2019 3:45 PM

## 2019-08-27 NOTE — Telephone Encounter (Signed)
Attempted to call patient, no answer, VM not set up.  Will try again later.

## 2019-08-27 NOTE — Telephone Encounter (Signed)
Next available with APP is 09/06/19  Spoke with the pt and offered appt and pred  She states that she is questioning prednisone b/c she is supposed to start back on keytruda 08/28/19  She is unsure if this is okay to take with prednisone  Please advise, thanks!

## 2019-08-27 NOTE — Telephone Encounter (Signed)
We may need to continue holding her treatment until she feels better.  Thank you.

## 2019-08-28 ENCOUNTER — Other Ambulatory Visit: Payer: Self-pay

## 2019-08-28 ENCOUNTER — Inpatient Hospital Stay: Payer: PPO

## 2019-08-28 VITALS — BP 149/68 | HR 74 | Temp 98.8°F | Resp 18

## 2019-08-28 DIAGNOSIS — Z5112 Encounter for antineoplastic immunotherapy: Secondary | ICD-10-CM | POA: Diagnosis not present

## 2019-08-28 DIAGNOSIS — C3491 Malignant neoplasm of unspecified part of right bronchus or lung: Secondary | ICD-10-CM

## 2019-08-28 LAB — CMP (CANCER CENTER ONLY)
ALT: 6 U/L (ref 0–44)
AST: 12 U/L — ABNORMAL LOW (ref 15–41)
Albumin: 3 g/dL — ABNORMAL LOW (ref 3.5–5.0)
Alkaline Phosphatase: 58 U/L (ref 38–126)
Anion gap: 7 (ref 5–15)
BUN: 24 mg/dL — ABNORMAL HIGH (ref 8–23)
CO2: 31 mmol/L (ref 22–32)
Calcium: 9.7 mg/dL (ref 8.9–10.3)
Chloride: 103 mmol/L (ref 98–111)
Creatinine: 1.29 mg/dL — ABNORMAL HIGH (ref 0.44–1.00)
GFR, Est AFR Am: 47 mL/min — ABNORMAL LOW (ref >60)
GFR, Estimated: 40 mL/min — ABNORMAL LOW (ref >60)
Glucose, Bld: 95 mg/dL (ref 70–99)
Potassium: 4.9 mmol/L (ref 3.5–5.1)
Sodium: 141 mmol/L (ref 135–145)
Total Bilirubin: 0.3 mg/dL (ref 0.3–1.2)
Total Protein: 7 g/dL (ref 6.5–8.1)

## 2019-08-28 LAB — CBC WITH DIFFERENTIAL (CANCER CENTER ONLY)
Abs Immature Granulocytes: 0.1 10*3/uL — ABNORMAL HIGH (ref 0.00–0.07)
Basophils Absolute: 0.1 10*3/uL (ref 0.0–0.1)
Basophils Relative: 1 %
Eosinophils Absolute: 0.1 10*3/uL (ref 0.0–0.5)
Eosinophils Relative: 1 %
HCT: 42.2 % (ref 36.0–46.0)
Hemoglobin: 13.2 g/dL (ref 12.0–15.0)
Immature Granulocytes: 1 %
Lymphocytes Relative: 19 %
Lymphs Abs: 2.1 10*3/uL (ref 0.7–4.0)
MCH: 31.5 pg (ref 26.0–34.0)
MCHC: 31.3 g/dL (ref 30.0–36.0)
MCV: 100.7 fL — ABNORMAL HIGH (ref 80.0–100.0)
Monocytes Absolute: 0.8 10*3/uL (ref 0.1–1.0)
Monocytes Relative: 7 %
Neutro Abs: 7.8 10*3/uL — ABNORMAL HIGH (ref 1.7–7.7)
Neutrophils Relative %: 71 %
Platelet Count: 274 10*3/uL (ref 150–400)
RBC: 4.19 MIL/uL (ref 3.87–5.11)
RDW: 14.2 % (ref 11.5–15.5)
WBC Count: 10.9 10*3/uL — ABNORMAL HIGH (ref 4.0–10.5)
nRBC: 0 % (ref 0.0–0.2)

## 2019-08-28 LAB — TSH: TSH: 1.542 u[IU]/mL (ref 0.308–3.960)

## 2019-08-28 MED ORDER — FAMOTIDINE IN NACL 20-0.9 MG/50ML-% IV SOLN
20.0000 mg | Freq: Once | INTRAVENOUS | Status: AC
  Administered 2019-08-28: 20 mg via INTRAVENOUS

## 2019-08-28 MED ORDER — SODIUM CHLORIDE 0.9% FLUSH
10.0000 mL | INTRAVENOUS | Status: DC | PRN
  Administered 2019-08-28: 10 mL
  Filled 2019-08-28: qty 10

## 2019-08-28 MED ORDER — HEPARIN SOD (PORK) LOCK FLUSH 100 UNIT/ML IV SOLN
500.0000 [IU] | Freq: Once | INTRAVENOUS | Status: AC | PRN
  Administered 2019-08-28: 500 [IU]
  Filled 2019-08-28: qty 5

## 2019-08-28 MED ORDER — ACETAMINOPHEN 325 MG PO TABS
650.0000 mg | ORAL_TABLET | Freq: Once | ORAL | Status: AC
  Administered 2019-08-28: 650 mg via ORAL

## 2019-08-28 MED ORDER — DIPHENHYDRAMINE HCL 50 MG/ML IJ SOLN
25.0000 mg | Freq: Once | INTRAMUSCULAR | Status: AC
  Administered 2019-08-28: 25 mg via INTRAVENOUS

## 2019-08-28 MED ORDER — FAMOTIDINE IN NACL 20-0.9 MG/50ML-% IV SOLN
INTRAVENOUS | Status: AC
  Filled 2019-08-28: qty 50

## 2019-08-28 MED ORDER — SODIUM CHLORIDE 0.9 % IV SOLN
200.0000 mg | Freq: Once | INTRAVENOUS | Status: AC
  Administered 2019-08-28: 200 mg via INTRAVENOUS
  Filled 2019-08-28: qty 8

## 2019-08-28 MED ORDER — SODIUM CHLORIDE 0.9 % IV SOLN
Freq: Once | INTRAVENOUS | Status: AC
  Filled 2019-08-28: qty 250

## 2019-08-28 MED ORDER — DIPHENHYDRAMINE HCL 50 MG/ML IJ SOLN
INTRAMUSCULAR | Status: AC
  Filled 2019-08-28: qty 1

## 2019-08-28 MED ORDER — ACETAMINOPHEN 325 MG PO TABS
ORAL_TABLET | ORAL | Status: AC
  Filled 2019-08-28: qty 2

## 2019-08-28 NOTE — Patient Instructions (Signed)
[facility] Discharge Instructions for Patients Receiving Chemotherapy  Today you received the following chemotherapy agents:  Keytruda.  To help prevent nausea and vomiting after your treatment, we encourage you to take your nausea medication as directed.   If you develop nausea and vomiting that is not controlled by your nausea medication, call the clinic.   BELOW ARE SYMPTOMS THAT SHOULD BE REPORTED IMMEDIATELY:  *FEVER GREATER THAN 100.5 F  *CHILLS WITH OR WITHOUT FEVER  NAUSEA AND VOMITING THAT IS NOT CONTROLLED WITH YOUR NAUSEA MEDICATION  *UNUSUAL SHORTNESS OF BREATH  *UNUSUAL BRUISING OR BLEEDING  TENDERNESS IN MOUTH AND THROAT WITH OR WITHOUT PRESENCE OF ULCERS  *URINARY PROBLEMS  *BOWEL PROBLEMS  UNUSUAL RASH Items with * indicate a potential emergency and should be followed up as soon as possible.  Feel free to call the clinic should you have any questions or concerns. The clinic phone number is [phone].  Please show the CHEMO ALERT CARD at check-in to the Emergency Department and triage nurse.    

## 2019-08-28 NOTE — Telephone Encounter (Signed)
Erin Myers will need to monitor her closely for any worsening of her symptoms.  If she has we will start her on high-dose steroids.  Thank you.

## 2019-09-02 ENCOUNTER — Telehealth: Payer: Self-pay | Admitting: Medical Oncology

## 2019-09-02 ENCOUNTER — Encounter: Payer: Self-pay | Admitting: Internal Medicine

## 2019-09-02 NOTE — Telephone Encounter (Signed)
"  [name] Onc Nurse Cc Good morning, I was calling about the cough I had and Dr Earlie Server had put me on prednisone to help with it. It did help but I am still coughing and the rattling is somewhat better. I did cough all day yesterday but I'm also sneezing so I have no idea exactly what's going on. Just wondering if Dr Rogue Jury wants me to do another round of prednisone or wait until I come and see you on July 7th for him to check me out again. It's just very aggravating to be coughing so much. Any information you can give me I would greatly appreciate it "  Last [facility] 08/28/19

## 2019-09-03 ENCOUNTER — Other Ambulatory Visit: Payer: Self-pay | Admitting: Internal Medicine

## 2019-09-03 DIAGNOSIS — I2699 Other pulmonary embolism without acute cor pulmonale: Secondary | ICD-10-CM

## 2019-09-04 ENCOUNTER — Telehealth: Payer: Self-pay | Admitting: Medical Oncology

## 2019-09-04 ENCOUNTER — Other Ambulatory Visit: Payer: Self-pay | Admitting: Medical Oncology

## 2019-09-04 DIAGNOSIS — Z5112 Encounter for antineoplastic immunotherapy: Secondary | ICD-10-CM

## 2019-09-04 MED ORDER — METHYLPREDNISOLONE 4 MG PO TBPK: ORAL_TABLET | ORAL | 0 refills | Status: DC

## 2019-09-04 NOTE — Telephone Encounter (Signed)
In response to pts email , Dr [name] ordered a  medrol dose pack. Pt instructed to call if symptom does not improve.

## 2019-09-07 DIAGNOSIS — J449 Chronic obstructive pulmonary disease, unspecified: Secondary | ICD-10-CM | POA: Diagnosis not present

## 2019-09-09 ENCOUNTER — Encounter: Payer: Self-pay | Admitting: Internal Medicine

## 2019-09-10 ENCOUNTER — Encounter: Payer: Self-pay | Admitting: Internal Medicine

## 2019-09-11 ENCOUNTER — Other Ambulatory Visit: Payer: Self-pay | Admitting: Medical Oncology

## 2019-09-11 DIAGNOSIS — C3491 Malignant neoplasm of unspecified part of right bronchus or lung: Secondary | ICD-10-CM | POA: Diagnosis not present

## 2019-09-11 DIAGNOSIS — E785 Hyperlipidemia, unspecified: Secondary | ICD-10-CM | POA: Diagnosis not present

## 2019-09-11 DIAGNOSIS — Z5112 Encounter for antineoplastic immunotherapy: Secondary | ICD-10-CM

## 2019-09-11 DIAGNOSIS — I2699 Other pulmonary embolism without acute cor pulmonale: Secondary | ICD-10-CM | POA: Diagnosis not present

## 2019-09-11 DIAGNOSIS — I10 Essential (primary) hypertension: Secondary | ICD-10-CM | POA: Diagnosis not present

## 2019-09-11 DIAGNOSIS — F419 Anxiety disorder, unspecified: Secondary | ICD-10-CM | POA: Diagnosis not present

## 2019-09-11 MED ORDER — METHYLPREDNISOLONE 4 MG PO TBPK: ORAL_TABLET | ORAL | 0 refills | Status: DC

## 2019-09-17 ENCOUNTER — Telehealth: Payer: Self-pay | Admitting: Medical Oncology

## 2019-09-17 ENCOUNTER — Encounter: Payer: Self-pay | Admitting: Internal Medicine

## 2019-09-17 NOTE — Telephone Encounter (Signed)
Pt confirmed appt with Brain Mack.

## 2019-09-17 NOTE — Telephone Encounter (Signed)
Returned call regarding email today:Just completed 2nd round of medrol .   "Good morning, I think I may need another round of prednisone I'm still rattling still coughing up a lot of stuff. I think maybe one more round and it may start to help me. It takes a while for that to work for me. You can send it to [facility] thank you so much I appreciate it."  She said in 2019 she had a similar episode and it took 2 months to get better. " Dr Lenna Gilford kept giving me Prednisone".

## 2019-09-17 NOTE — Telephone Encounter (Signed)
Good morning, I think I may need another round of prednisone I'm still rattling still coughing up a lot of stuff. I think maybe one more round and it may start to help me. It takes a while for that to work for me. You can send it to [facility] thank you so much I appreciate it.    Lakie completed 2 rounds of a medrol dose pak,. Dr [name] wants her to see Dr Valeta Harms.

## 2019-09-18 ENCOUNTER — Inpatient Hospital Stay: Payer: PPO

## 2019-09-18 ENCOUNTER — Telehealth: Payer: Self-pay | Admitting: Medical Oncology

## 2019-09-18 ENCOUNTER — Inpatient Hospital Stay: Payer: PPO | Admitting: Internal Medicine

## 2019-09-18 NOTE — Telephone Encounter (Signed)
Pt cancelled today due to not feeling well after her fall Monday.   I told pt we will get her r/s. She has video visit Thursday with Pulmonary.

## 2019-09-19 ENCOUNTER — Telehealth: Payer: Self-pay | Admitting: Pulmonary Disease

## 2019-09-19 ENCOUNTER — Ambulatory Visit (INDEPENDENT_AMBULATORY_CARE_PROVIDER_SITE_OTHER): Payer: PPO | Admitting: Pulmonary Disease

## 2019-09-19 ENCOUNTER — Other Ambulatory Visit: Payer: Self-pay

## 2019-09-19 ENCOUNTER — Encounter: Payer: Self-pay | Admitting: Pulmonary Disease

## 2019-09-19 DIAGNOSIS — R05 Cough: Secondary | ICD-10-CM

## 2019-09-19 DIAGNOSIS — C3491 Malignant neoplasm of unspecified part of right bronchus or lung: Secondary | ICD-10-CM

## 2019-09-19 DIAGNOSIS — J8489 Other specified interstitial pulmonary diseases: Secondary | ICD-10-CM | POA: Diagnosis not present

## 2019-09-19 DIAGNOSIS — J9611 Chronic respiratory failure with hypoxia: Secondary | ICD-10-CM | POA: Diagnosis not present

## 2019-09-19 DIAGNOSIS — R059 Cough, unspecified: Secondary | ICD-10-CM

## 2019-09-19 MED ORDER — PREDNISONE 10 MG PO TABS: ORAL_TABLET | ORAL | 0 refills | Status: DC

## 2019-09-19 NOTE — Telephone Encounter (Signed)
Spoke with patient and let her know that we would not have anyone to do x-rays at the [facility] Location on 09/20/19.  Let her know that she would need to go to the [facility] location and have it done there in the basement.  Nothing further needed.

## 2019-09-19 NOTE — Assessment & Plan Note (Signed)
Plan: Long prednisone taper today Chest x-ray tomorrow

## 2019-09-19 NOTE — Patient Instructions (Signed)
You were seen today by Lauraine Rinne, NP  for:   1. Chronic hypoxemic respiratory failure (HCC)  Continue oxygen therapy as prescribed  >>>maintain oxygen saturations greater than 88 percent  >>>if unable to maintain oxygen saturations please contact the office  >>>do not smoke with oxygen  >>>can use nasal saline gel or nasal saline rinses to moisturize nose if oxygen causes dryness  2. Adenocarcinoma of right lung, stage 4 ([place])  Continue follow-up with oncology  Continue Keytruda  3. Pneumonitis, interstitial (HCC)  - predniSONE (DELTASONE) 10 MG tablet; Take 4 tablets (40 mg total) by mouth daily with breakfast for 5 days, THEN 3 tablets (30 mg total) daily with breakfast for 5 days, THEN 2 tablets (20 mg total) daily with breakfast for 5 days, THEN 1 tablet (10 mg total) daily with breakfast for 5 days.  Dispense: 50 tablet; Refill: 0 - DG Chest 2 View; Future  4. Cough  - predniSONE (DELTASONE) 10 MG tablet; Take 4 tablets (40 mg total) by mouth daily with breakfast for 5 days, THEN 3 tablets (30 mg total) daily with breakfast for 5 days, THEN 2 tablets (20 mg total) daily with breakfast for 5 days, THEN 1 tablet (10 mg total) daily with breakfast for 5 days.  Dispense: 50 tablet; Refill: 0 - DG Chest 2 View; Future   We recommend today:  Orders Placed This Encounter  Procedures  . DG Chest 2 View    Standing Status:   Future    Standing Expiration Date:   09/18/2020    Order Specific Question:   Reason for Exam (SYMPTOM  OR DIAGNOSIS REQUIRED)    Answer:   pneumonitis and cough    Order Specific Question:   Preferred imaging location?    Answer:   [facility]    Order Specific Question:   Radiology Contrast Protocol - do NOT remove file path    Answer:   [email or web address]   Orders Placed This Encounter  Procedures  . DG Chest 2 View   Meds ordered this encounter  Medications  . predniSONE (DELTASONE) 10 MG tablet    Sig:  Take 4 tablets (40 mg total) by mouth daily with breakfast for 5 days, THEN 3 tablets (30 mg total) daily with breakfast for 5 days, THEN 2 tablets (20 mg total) daily with breakfast for 5 days, THEN 1 tablet (10 mg total) daily with breakfast for 5 days.    Dispense:  50 tablet    Refill:  0    Follow Up:    No follow-ups on file.   Please do your part to reduce the spread of COVID-19:      Reduce your risk of any infection  and COVID19 by using the similar precautions used for avoiding the common cold or flu:  [name] Wash your hands often with soap and warm water for at least 20 seconds.  If soap and water are not readily available, use an alcohol-based hand sanitizer with at least 60% alcohol.  . If coughing or sneezing, cover your mouth and nose by coughing or sneezing into the elbow areas of your shirt or coat, into a tissue or into your sleeve (not your hands). Langley Gauss A MASK when in public  . Avoid shaking hands with others and consider head nods or verbal greetings only. . Avoid touching your eyes, nose, or mouth with unwashed hands.  . Avoid close contact with people who are sick. . Avoid places or events with large  numbers of people in one location, like concerts or sporting events. . If you have some symptoms but not all symptoms, continue to monitor at home and seek medical attention if your symptoms worsen. . If you are having a medical emergency, call 911.   [place] / e-Visit: [email or web address]         [facility]: [facility]: [phone]                   [facility]: [phone]     It is flu season:   >>> Best ways to protect herself from the flu: Receive the yearly flu vaccine, practice good hand hygiene washing with soap and also using hand sanitizer when available, eat a nutritious meals, get adequate  rest, hydrate appropriately   Please contact the office if your symptoms worsen or you have concerns that you are not improving.   Thank you for choosing [facility] for your healthcare, and for allowing [place] to partner with you on your healthcare journey. I am thankful to be able to provide care to you today.   Wyn Quaker FNP-C

## 2019-09-19 NOTE — Assessment & Plan Note (Signed)
Plan: Continue oxygen therapy 

## 2019-09-19 NOTE — Assessment & Plan Note (Signed)
Plan: Continue follow-up with oncology Continue [facility]

## 2019-09-19 NOTE — Assessment & Plan Note (Signed)
Plan: Prednisone taper today We will hold off on antibiotics Chest x-ray tomorrow

## 2019-09-19 NOTE — Progress Notes (Signed)
Virtual Visit via Telephone Note  I connected with Erin Myers on 09/19/19 at  3:30 PM EDT by telephone and verified that I am speaking with the correct person using two identifiers.  Location: Patient: Home Provider: [facility] - [address], [place], [place], [place] [address]   I discussed the limitations, risks, security and privacy concerns of performing an evaluation and management service by telephone and the availability of in person appointments. I also discussed with the patient that there may be a patient responsible charge related to this service. The patient expressed understanding and agreed to proceed.  Patient consented to consult via telephone: Yes People present and their role in pt care: Pt     History of Present Illness:  79 year old female patient seen in our office by Dr. Valeta Harms, followed for bronchiectasis, cough, dyspnea on exertion  PMH: Stage IV (T3, N0, M1 a)non-small cell lung cancer, well-differentiated adenocarcinoma presented with multifocal disease involving mainly the right upper lobe as well as the right middle lobe and left lower lobe of the lung diagnosed in May 2019. Smoker/ Smoking History: Former smoker.  22.5-pack-year history. Maintenance: Breztri  Pt of: Dr. Valeta Harms  Chief complaint:    79 year old female former smoker followed in our office for cough bronchiectasis and dyspnea on exertion.  She is also followed by Dr. Earlie Server with oncology.  She is completing an acute visit with our office today due to worsening cough and congestion.  She was last seen in our office in April/2021 by Dr. Valeta Harms plan of care from that office visit was to complete upcoming CT scan with oncology in June.  Patient to start braze tree.  Continue oxygen therapy.  Patient was found to have pneumonitis from immunotherapy on top of her already baseline chronic hypoxemic respiratory failure.  There are messages from June/2021 from oncology in our  office about starting the patient on steroids.  There were concerns given the fact that she was about to start Routt.  It was recommended that point in time that she start Gu-Win but if she has a worsened respiratory symptoms that she will need high-dose steroids managed by oncology.  Patient canceled oncology infusion that was scheduled for today due to not feeling well after a fall she had on Monday.   Patient completing telephonic visit with our office today.  She is reporting ongoing cough with congestion.  She feels that this has improved with the Medrol Dosepak that was provided by oncology.  She continues to have ongoing cough.  This is audible on the telephone visit.  She feels that her shortness of breath also is not at baseline.  She plans to resume Keytruda infusions next week.  Observations/Objective:  04/15/2019-CT chest without contrast-stable appearance of indistinct nodularity groundglass opacities and consolidation favoring the lung bases, appearance remains abnormal but nonspecific and could be seen in the setting of chronic atypical infection, hypersensitivity pneumonitis, sarcoidosis, lymphogenic carcinomatosis, chronic eosinophilic pneumonia and cryptogenic organizing pneumonia.  Aortic arthrosclerosis, cholelithiasis  02/20/2018-CT chest with contrast- persistent right upper lobe findings with percent perhaps slight contraction or decreased size of the more solid-appearing component, persistent fairly extensive patchy groundglass opacity and tree-in-bud appearance in lower lung zones this most likely chronic inflammation or atypical infection such as MAC, no new worrisome pulmonary lesions stable borderline mediastinal and hilar lymph nodes, stable right middle lobe bronchiectasis, stable underlying emphysematous changes  10/16/2017-CT chest chest-progressive solid consolidation part of the right upper lobe aggressive appearing bilateral  lower lobe process likely interstitial small  bilateral pulmonary emboli noted  08/09/2017-PET scan- borderline hypermetabolic right upper lobe consolidation and groundglass and hypometabolic patchy groundglass in both lower lobes  05/24/2017-echocardiogram LV ejection fraction 65 to 70%   05/02/2017-spirometry -normal spirometry  07/20/17 -bronchoscopy with BAL-Dr. Lamonte Sakai >>> Negative AFB, fungal, culture >>> Transbronchial brushings-malignant cells, consistent with adenocarcinoma >>> Surgical pathology-transbronchial biopsy right upper lobe, adenocarcinoma, well-differentiated   Social History   Tobacco Use  Smoking Status Former Smoker  . Packs/day: 0.75  . Years: 30.00  . Pack years: 22.50  . Types: Cigarettes  . Quit date: 08/18/1991  . Years since quitting: 28.1  Smokeless Tobacco Never Used   Immunization History  Administered Date(s) Administered  . Fluad Quad(high Dose 65+) 11/08/2018  . Influenza Split 12/14/2016  . Influenza, High Dose Seasonal PF 02/20/2014  . Influenza,inj,Quad PF,6+ Mos 12/13/2014  . Influenza-Unspecified 01/25/2018  . PFIZER SARS-COV-2 Vaccination 04/05/2019, 04/23/2019  . Pneumococcal Polysaccharide-23 07/17/2013      Assessment and Plan:  Adenocarcinoma of right lung, stage 4 (HCC) Plan: Continue follow-up with oncology Continue Keytruda  Chronic hypoxemic respiratory failure ([place]) Plan: Continue oxygen therapy  Cough Plan: Prednisone taper today We will hold off on antibiotics Chest x-ray tomorrow  Pneumonitis, interstitial (HCC) Plan: Long prednisone taper today Chest x-ray tomorrow   Follow Up Instructions:  Return in about 4 weeks (around 10/17/2019), or if symptoms worsen or fail to improve, for Follow up with Wyn Quaker FNP-C, Follow up with Dr. Valeta Harms.   I discussed the assessment and treatment plan with the patient. The patient was provided an opportunity to ask questions and all were answered. The patient agreed with the plan and demonstrated an understanding of  the instructions.   The patient was advised to call back or seek an in-person evaluation if the symptoms worsen or if the condition fails to improve as anticipated.  I provided 23 minutes of non-face-to-face time during this encounter.   Lauraine Rinne, NP

## 2019-09-20 ENCOUNTER — Other Ambulatory Visit: Payer: Self-pay | Admitting: Pulmonary Disease

## 2019-09-20 ENCOUNTER — Ambulatory Visit (INDEPENDENT_AMBULATORY_CARE_PROVIDER_SITE_OTHER)
Admission: RE | Admit: 2019-09-20 | Discharge: 2019-09-20 | Disposition: A | Discharge status: Home / Self Care | Payer: PPO | Source: Ambulatory Visit | Attending: Pulmonary Disease | Admitting: Pulmonary Disease

## 2019-09-20 ENCOUNTER — Other Ambulatory Visit: Payer: Self-pay

## 2019-09-20 DIAGNOSIS — J90 Pleural effusion, not elsewhere classified: Secondary | ICD-10-CM | POA: Diagnosis not present

## 2019-09-20 DIAGNOSIS — R05 Cough: Secondary | ICD-10-CM

## 2019-09-20 DIAGNOSIS — J189 Pneumonia, unspecified organism: Secondary | ICD-10-CM | POA: Diagnosis not present

## 2019-09-20 DIAGNOSIS — J8489 Other specified interstitial pulmonary diseases: Secondary | ICD-10-CM

## 2019-09-20 DIAGNOSIS — R059 Cough, unspecified: Secondary | ICD-10-CM

## 2019-09-20 MED ORDER — AMOXICILLIN-POT CLAVULANATE 875-125 MG PO TABS
1.0000 | ORAL_TABLET | Freq: Two times a day (BID) | ORAL | 0 refills | Status: DC
Start: 2019-09-20 — End: 2019-10-12

## 2019-09-20 NOTE — Progress Notes (Unsigned)
a 

## 2019-09-25 DIAGNOSIS — R58 Hemorrhage, not elsewhere classified: Secondary | ICD-10-CM | POA: Diagnosis not present

## 2019-09-25 DIAGNOSIS — Z7901 Long term (current) use of anticoagulants: Secondary | ICD-10-CM | POA: Diagnosis not present

## 2019-09-25 DIAGNOSIS — C3491 Malignant neoplasm of unspecified part of right bronchus or lung: Secondary | ICD-10-CM | POA: Diagnosis not present

## 2019-09-25 DIAGNOSIS — T7589XS Other specified effects of external causes, sequela: Secondary | ICD-10-CM | POA: Diagnosis not present

## 2019-09-25 DIAGNOSIS — W19XXXA Unspecified fall, initial encounter: Secondary | ICD-10-CM | POA: Diagnosis not present

## 2019-09-25 DIAGNOSIS — S80211A Abrasion, right knee, initial encounter: Secondary | ICD-10-CM | POA: Diagnosis not present

## 2019-09-25 DIAGNOSIS — Z9981 Dependence on supplemental oxygen: Secondary | ICD-10-CM | POA: Diagnosis not present

## 2019-09-29 ENCOUNTER — Encounter: Payer: Self-pay | Admitting: Internal Medicine

## 2019-09-30 ENCOUNTER — Inpatient Hospital Stay (HOSPITAL_COMMUNITY): Payer: PPO

## 2019-09-30 ENCOUNTER — Emergency Department (HOSPITAL_COMMUNITY): Payer: PPO

## 2019-09-30 ENCOUNTER — Inpatient Hospital Stay (HOSPITAL_COMMUNITY)
Admission: EM | Admit: 2019-09-30 | Discharge: 2019-10-12 | DRG: 871 | Disposition: A | Discharge status: Home Health | Payer: PPO | Attending: Internal Medicine | Admitting: Internal Medicine

## 2019-09-30 ENCOUNTER — Telehealth: Payer: Self-pay | Admitting: Pulmonary Disease

## 2019-09-30 ENCOUNTER — Encounter (HOSPITAL_COMMUNITY): Payer: Self-pay

## 2019-09-30 ENCOUNTER — Other Ambulatory Visit: Payer: Self-pay

## 2019-09-30 DIAGNOSIS — R0602 Shortness of breath: Secondary | ICD-10-CM

## 2019-09-30 DIAGNOSIS — J9621 Acute and chronic respiratory failure with hypoxia: Secondary | ICD-10-CM | POA: Diagnosis present

## 2019-09-30 DIAGNOSIS — Z87891 Personal history of nicotine dependence: Secondary | ICD-10-CM

## 2019-09-30 DIAGNOSIS — Z20822 Contact with and (suspected) exposure to covid-19: Secondary | ICD-10-CM | POA: Diagnosis not present

## 2019-09-30 DIAGNOSIS — Z90710 Acquired absence of both cervix and uterus: Secondary | ICD-10-CM

## 2019-09-30 DIAGNOSIS — Z8249 Family history of ischemic heart disease and other diseases of the circulatory system: Secondary | ICD-10-CM

## 2019-09-30 DIAGNOSIS — Z885 Allergy status to narcotic agent status: Secondary | ICD-10-CM | POA: Diagnosis not present

## 2019-09-30 DIAGNOSIS — R0689 Other abnormalities of breathing: Secondary | ICD-10-CM | POA: Diagnosis not present

## 2019-09-30 DIAGNOSIS — Z859 Personal history of malignant neoplasm, unspecified: Secondary | ICD-10-CM

## 2019-09-30 DIAGNOSIS — Z79890 Hormone replacement therapy (postmenopausal): Secondary | ICD-10-CM

## 2019-09-30 DIAGNOSIS — I10 Essential (primary) hypertension: Secondary | ICD-10-CM | POA: Diagnosis not present

## 2019-09-30 DIAGNOSIS — A419 Sepsis, unspecified organism: Secondary | ICD-10-CM | POA: Diagnosis not present

## 2019-09-30 DIAGNOSIS — Z515 Encounter for palliative care: Secondary | ICD-10-CM

## 2019-09-30 DIAGNOSIS — C3431 Malignant neoplasm of lower lobe, right bronchus or lung: Secondary | ICD-10-CM | POA: Diagnosis not present

## 2019-09-30 DIAGNOSIS — J9601 Acute respiratory failure with hypoxia: Secondary | ICD-10-CM | POA: Diagnosis not present

## 2019-09-30 DIAGNOSIS — N1832 Chronic kidney disease, stage 3b: Secondary | ICD-10-CM | POA: Diagnosis present

## 2019-09-30 DIAGNOSIS — Z882 Allergy status to sulfonamides status: Secondary | ICD-10-CM | POA: Diagnosis not present

## 2019-09-30 DIAGNOSIS — Z91011 Allergy to milk products: Secondary | ICD-10-CM

## 2019-09-30 DIAGNOSIS — E1122 Type 2 diabetes mellitus with diabetic chronic kidney disease: Secondary | ICD-10-CM | POA: Diagnosis not present

## 2019-09-30 DIAGNOSIS — J189 Pneumonia, unspecified organism: Secondary | ICD-10-CM | POA: Diagnosis present

## 2019-09-30 DIAGNOSIS — I2699 Other pulmonary embolism without acute cor pulmonale: Secondary | ICD-10-CM | POA: Diagnosis not present

## 2019-09-30 DIAGNOSIS — D638 Anemia in other chronic diseases classified elsewhere: Secondary | ICD-10-CM | POA: Diagnosis not present

## 2019-09-30 DIAGNOSIS — Z9221 Personal history of antineoplastic chemotherapy: Secondary | ICD-10-CM | POA: Diagnosis not present

## 2019-09-30 DIAGNOSIS — R0902 Hypoxemia: Secondary | ICD-10-CM | POA: Diagnosis not present

## 2019-09-30 DIAGNOSIS — I129 Hypertensive chronic kidney disease with stage 1 through stage 4 chronic kidney disease, or unspecified chronic kidney disease: Secondary | ICD-10-CM | POA: Diagnosis not present

## 2019-09-30 DIAGNOSIS — Z9889 Other specified postprocedural states: Secondary | ICD-10-CM

## 2019-09-30 DIAGNOSIS — Z86711 Personal history of pulmonary embolism: Secondary | ICD-10-CM

## 2019-09-30 DIAGNOSIS — N179 Acute kidney failure, unspecified: Secondary | ICD-10-CM | POA: Diagnosis not present

## 2019-09-30 DIAGNOSIS — Z7901 Long term (current) use of anticoagulants: Secondary | ICD-10-CM

## 2019-09-30 DIAGNOSIS — J449 Chronic obstructive pulmonary disease, unspecified: Secondary | ICD-10-CM | POA: Diagnosis present

## 2019-09-30 DIAGNOSIS — J9811 Atelectasis: Secondary | ICD-10-CM | POA: Diagnosis not present

## 2019-09-30 DIAGNOSIS — R0603 Acute respiratory distress: Secondary | ICD-10-CM

## 2019-09-30 DIAGNOSIS — Z8543 Personal history of malignant neoplasm of ovary: Secondary | ICD-10-CM

## 2019-09-30 DIAGNOSIS — E785 Hyperlipidemia, unspecified: Secondary | ICD-10-CM | POA: Diagnosis present

## 2019-09-30 DIAGNOSIS — Z86718 Personal history of other venous thrombosis and embolism: Secondary | ICD-10-CM

## 2019-09-30 DIAGNOSIS — I959 Hypotension, unspecified: Secondary | ICD-10-CM | POA: Diagnosis not present

## 2019-09-30 DIAGNOSIS — Z6830 Body mass index (BMI) 30.0-30.9, adult: Secondary | ICD-10-CM

## 2019-09-30 DIAGNOSIS — J90 Pleural effusion, not elsewhere classified: Secondary | ICD-10-CM | POA: Diagnosis not present

## 2019-09-30 DIAGNOSIS — Z888 Allergy status to other drugs, medicaments and biological substances status: Secondary | ICD-10-CM | POA: Diagnosis not present

## 2019-09-30 DIAGNOSIS — G4733 Obstructive sleep apnea (adult) (pediatric): Secondary | ICD-10-CM | POA: Diagnosis present

## 2019-09-30 DIAGNOSIS — C3491 Malignant neoplasm of unspecified part of right bronchus or lung: Secondary | ICD-10-CM | POA: Diagnosis not present

## 2019-09-30 DIAGNOSIS — C3432 Malignant neoplasm of lower lobe, left bronchus or lung: Secondary | ICD-10-CM | POA: Diagnosis not present

## 2019-09-30 DIAGNOSIS — J479 Bronchiectasis, uncomplicated: Secondary | ICD-10-CM | POA: Diagnosis not present

## 2019-09-30 DIAGNOSIS — J181 Lobar pneumonia, unspecified organism: Secondary | ICD-10-CM | POA: Diagnosis not present

## 2019-09-30 DIAGNOSIS — Z7189 Other specified counseling: Secondary | ICD-10-CM | POA: Diagnosis not present

## 2019-09-30 DIAGNOSIS — I700 Atherosclerosis of aorta: Secondary | ICD-10-CM | POA: Diagnosis present

## 2019-09-30 DIAGNOSIS — R000 Tachycardia, unspecified: Secondary | ICD-10-CM | POA: Diagnosis not present

## 2019-09-30 DIAGNOSIS — E669 Obesity, unspecified: Secondary | ICD-10-CM | POA: Diagnosis present

## 2019-09-30 DIAGNOSIS — R042 Hemoptysis: Secondary | ICD-10-CM | POA: Diagnosis not present

## 2019-09-30 DIAGNOSIS — Z79899 Other long term (current) drug therapy: Secondary | ICD-10-CM

## 2019-09-30 DIAGNOSIS — Z902 Acquired absence of lung [part of]: Secondary | ICD-10-CM | POA: Diagnosis not present

## 2019-09-30 DIAGNOSIS — Z7952 Long term (current) use of systemic steroids: Secondary | ICD-10-CM

## 2019-09-30 DIAGNOSIS — R918 Other nonspecific abnormal finding of lung field: Secondary | ICD-10-CM | POA: Diagnosis not present

## 2019-09-30 DIAGNOSIS — E039 Hypothyroidism, unspecified: Secondary | ICD-10-CM | POA: Diagnosis not present

## 2019-09-30 DIAGNOSIS — Z818 Family history of other mental and behavioral disorders: Secondary | ICD-10-CM

## 2019-09-30 DIAGNOSIS — Z823 Family history of stroke: Secondary | ICD-10-CM

## 2019-09-30 DIAGNOSIS — J8489 Other specified interstitial pulmonary diseases: Secondary | ICD-10-CM | POA: Diagnosis not present

## 2019-09-30 LAB — BRAIN NATRIURETIC PEPTIDE: B Natriuretic Peptide: 97.7 pg/mL (ref 0.0–100.0)

## 2019-09-30 LAB — CBC
HCT: 43.1 % (ref 36.0–46.0)
Hemoglobin: 13.2 g/dL (ref 12.0–15.0)
MCH: 31.8 pg (ref 26.0–34.0)
MCHC: 30.6 g/dL (ref 30.0–36.0)
MCV: 103.9 fL — ABNORMAL HIGH (ref 80.0–100.0)
Platelets: 217 10*3/uL (ref 150–400)
RBC: 4.15 MIL/uL (ref 3.87–5.11)
RDW: 16.7 % — ABNORMAL HIGH (ref 11.5–15.5)
WBC: 29.6 10*3/uL — ABNORMAL HIGH (ref 4.0–10.5)
nRBC: 0 % (ref 0.0–0.2)

## 2019-09-30 LAB — COMPREHENSIVE METABOLIC PANEL
ALT: 21 U/L (ref 0–44)
AST: 18 U/L (ref 15–41)
Albumin: 3 g/dL — ABNORMAL LOW (ref 3.5–5.0)
Alkaline Phosphatase: 53 U/L (ref 38–126)
Anion gap: 15 (ref 5–15)
BUN: 44 mg/dL — ABNORMAL HIGH (ref 8–23)
CO2: 28 mmol/L (ref 22–32)
Calcium: 8.9 mg/dL (ref 8.9–10.3)
Chloride: 97 mmol/L — ABNORMAL LOW (ref 98–111)
Creatinine, Ser: 1.89 mg/dL — ABNORMAL HIGH (ref 0.44–1.00)
GFR calc Af Amer: 29 mL/min — ABNORMAL LOW (ref >60)
GFR calc non Af Amer: 25 mL/min — ABNORMAL LOW (ref >60)
Glucose, Bld: 124 mg/dL — ABNORMAL HIGH (ref 70–99)
Potassium: 5.2 mmol/L — ABNORMAL HIGH (ref 3.5–5.1)
Sodium: 140 mmol/L (ref 135–145)
Total Bilirubin: 0.6 mg/dL (ref 0.3–1.2)
Total Protein: 5.8 g/dL — ABNORMAL LOW (ref 6.5–8.1)

## 2019-09-30 LAB — SARS CORONAVIRUS 2 BY RT PCR (HOSPITAL ORDER, PERFORMED IN ~~LOC~~ HOSPITAL LAB): SARS Coronavirus 2: NEGATIVE

## 2019-09-30 LAB — TROPONIN I (HIGH SENSITIVITY)
Troponin I (High Sensitivity): 31 ng/L — ABNORMAL HIGH (ref <18)
Troponin I (High Sensitivity): 24 ng/L — ABNORMAL HIGH (ref <18)

## 2019-09-30 MED ORDER — METHYLPREDNISOLONE SODIUM SUCC 40 MG IJ SOLR: 40.0000 mg | Freq: Every day | INTRAMUSCULAR | Status: DC

## 2019-09-30 MED ORDER — ACETAMINOPHEN 325 MG PO TABS: 650.0000 mg | ORAL_TABLET | Freq: Four times a day (QID) | ORAL | Status: DC | PRN

## 2019-09-30 MED ORDER — SODIUM CHLORIDE 0.9 % IV SOLN: 2.0000 g | Freq: Once | INTRAVENOUS | Status: DC

## 2019-09-30 MED ORDER — SODIUM CHLORIDE 0.9 % IV SOLN
500.0000 mg | INTRAVENOUS | Status: DC
  Administered 2019-10-01 – 2019-10-05 (×4): 500 mg via INTRAVENOUS
  Filled 2019-09-30 (×4): qty 500

## 2019-09-30 MED ORDER — METOPROLOL SUCCINATE ER 50 MG PO TB24
50.0000 mg | ORAL_TABLET | Freq: Two times a day (BID) | ORAL | Status: DC
  Administered 2019-09-30 – 2019-10-12 (×24): 50 mg via ORAL
  Filled 2019-09-30 (×2): qty 2
  Filled 2019-09-30: qty 1
  Filled 2019-09-30: qty 2
  Filled 2019-09-30: qty 1
  Filled 2019-09-30: qty 2
  Filled 2019-09-30 (×5): qty 1
  Filled 2019-09-30: qty 2
  Filled 2019-09-30: qty 1
  Filled 2019-09-30: qty 2
  Filled 2019-09-30 (×3): qty 1
  Filled 2019-09-30: qty 2
  Filled 2019-09-30 (×5): qty 1
  Filled 2019-09-30: qty 2

## 2019-09-30 MED ORDER — DOCUSATE SODIUM 100 MG PO CAPS
100.0000 mg | ORAL_CAPSULE | Freq: Two times a day (BID) | ORAL | Status: DC
  Administered 2019-10-01 – 2019-10-02 (×3): 100 mg via ORAL
  Filled 2019-09-30 (×18): qty 1

## 2019-09-30 MED ORDER — SODIUM CHLORIDE 0.9 % IV SOLN
500.0000 mg | Freq: Once | INTRAVENOUS | Status: AC
  Administered 2019-09-30: 500 mg via INTRAVENOUS
  Filled 2019-09-30: qty 500

## 2019-09-30 MED ORDER — SODIUM CHLORIDE 0.9 % IV SOLN
2.0000 g | INTRAVENOUS | Status: DC
  Administered 2019-10-01 – 2019-10-05 (×4): 2 g via INTRAVENOUS
  Filled 2019-09-30 (×2): qty 2
  Filled 2019-09-30 (×2): qty 20
  Filled 2019-09-30: qty 2

## 2019-09-30 MED ORDER — RIVAROXABAN 20 MG PO TABS
20.0000 mg | ORAL_TABLET | Freq: Every day | ORAL | Status: DC
  Administered 2019-09-30 – 2019-10-01 (×2): 20 mg via ORAL
  Filled 2019-09-30 (×3): qty 1

## 2019-09-30 MED ORDER — METHYLPREDNISOLONE SODIUM SUCC 40 MG IJ SOLR
40.0000 mg | Freq: Two times a day (BID) | INTRAMUSCULAR | Status: DC
  Administered 2019-09-30 – 2019-10-05 (×10): 40 mg via INTRAVENOUS
  Filled 2019-09-30 (×10): qty 1

## 2019-09-30 MED ORDER — SODIUM CHLORIDE 0.9 % IV SOLN
2.0000 g | Freq: Once | INTRAVENOUS | Status: AC
  Administered 2019-09-30: 2 g via INTRAVENOUS
  Filled 2019-09-30: qty 20

## 2019-09-30 MED ORDER — LEVOTHYROXINE SODIUM 112 MCG PO TABS
112.0000 ug | ORAL_TABLET | Freq: Every day | ORAL | Status: DC
  Administered 2019-10-01 – 2019-10-11 (×11): 112 ug via ORAL
  Filled 2019-09-30 (×12): qty 1

## 2019-09-30 MED ORDER — SODIUM CHLORIDE 0.9 % IV SOLN: 500.0000 mg | Freq: Once | INTRAVENOUS | Status: DC

## 2019-09-30 MED ORDER — ACETAMINOPHEN 650 MG RE SUPP: 650.0000 mg | Freq: Four times a day (QID) | RECTAL | Status: DC | PRN

## 2019-09-30 MED ORDER — ROSUVASTATIN CALCIUM 10 MG PO TABS
10.0000 mg | ORAL_TABLET | Freq: Every evening | ORAL | Status: DC
  Administered 2019-09-30 – 2019-10-11 (×12): 10 mg via ORAL
  Filled 2019-09-30 (×13): qty 1

## 2019-09-30 MED ORDER — SODIUM CHLORIDE 0.9 % IV BOLUS
500.0000 mL | Freq: Once | INTRAVENOUS | Status: AC
  Administered 2019-09-30: 500 mL via INTRAVENOUS

## 2019-09-30 NOTE — ED Notes (Signed)
Patient received sandwich and water at this time.

## 2019-09-30 NOTE — ED Notes (Signed)
RT at bedside.

## 2019-09-30 NOTE — Consult Note (Signed)
NAME:  Erin Myers, MRN:  [id number], DOB:  Sep 03, 1940, LOS: 0 ADMISSION DATE:  09/30/2019, CONSULTATION DATE: 09/30/2019 REFERRING MD:  Rhetta Mura MD, CHIEF COMPLAINT: Acute hypoxic respiratory failure  Brief History   79 year old with stage IV adenocarcinoma diagnosed in May 2019, PE, [place] related interstitial pneumonitis  Admitted with hypoxic respiratory failure, worsening bilateral pulmonary infiltrates.  PCCM consulted for help with management  History of present illness   Lung cancer diagnosed in May 2019, treated with systemic chemotherapy and Alimta, Keytruda. She developed interstitial pneumonitis secondary to [facility] earlier this year treated with prednisone and Keytruda therapy was on hold. She has received several rounds of prednisone recently for immunotherapy related pneumonitis  Past Medical History    has a past medical history of Anxiety, Diabetes mellitus without complication ([place]), Dyslipidemia, HTN (hypertension), Hyperlipemia, Hypothyroid, Hypothyroidism, Lung cancer ([place]) (2000), Obesity, Ovarian cancer ([place]), Palpitations (2009 and 2006), and Sleep apnea.  Significant Hospital Events   7/19 admit  Consults:  PCCM  Procedures:    Significant Diagnostic Tests:  CT chest 09/30/2019-increasing bilateral lower lobe consolidation with nodular appearing areas in the upper lobe.  I have reviewed the images personally.   Micro Data:  Blood culture 7/19  Antimicrobials:  Ceftriaxone 7/19 Azithromycin 7/19  Interim history/subjective:    Objective   Blood pressure (!) 126/51, pulse (!) 107, temperature 98.3 F (36.8 C), temperature source Oral, resp. rate 19, height 5\' 5"  (1.651 m), weight 76.2 kg, SpO2 98 %.       No intake or output data in the 24 hours ending 09/30/19 1547 Filed Weights   09/30/19 1226  Weight: 76.2 kg    Examination: Blood pressure (!) 131/54, pulse 99, temperature 98.3 F (36.8 C), temperature source Oral, resp. rate  (!) 21, height 5\' 5"  (1.651 m), weight 76.2 kg, SpO2 100 %. Gen:      No acute distress HEENT:  EOMI, sclera anicteric Neck:     No masses; no thyromegaly Lungs:    Scattered bilateral crackles CV:         Regular rate and rhythm; no murmurs Abd:      + bowel sounds; soft, non-tender; no palpable masses, no distension Ext:    No edema; adequate peripheral perfusion Skin:      Warm and dry; no rash Neuro: alert and oriented x 3 Psych: normal mood and affect  Resolved Hospital Problem list     Assessment & Plan:  Acute hypoxic respiratory failure with worsening bilateral lung infiltrates History of stage IV lung cancer Recurrent immunotherapy related pneumonitis  Differential diagnosis at this point includes pneumonia versus immunotherapy related pneumonitis or progression of lung cancer  Agree with IV antibiotics Continue Solu-Medrol.  Will increase dose to 40 mg every 12 for 1 mg/kg dose Check procalcitonin, follow cultures, urine pneumococcus and Legionella test If Pct and cultures are negative then consider increasing Solu-Medrol to a higher dose of 125 mg every 6hrs  Ideally we would do a bronchoscopy but she has too high oxygen requirements at present to do this safely.  Pulmonary embolism Continue Xarelto  Best practice:  Diet: PO Pain/Anxiety/Delirium protocol (if indicated): NA VAP protocol (if indicated): NA DVT prophylaxis: Xarelto GI prophylaxis: NA Glucose control: Monitor Mobility: Bed Code Status: Full Family Communication: Patient updated Disposition:   Labs   CBC: Recent Labs  Lab 09/30/19 1325  WBC 29.6*  HGB 13.2  HCT 43.1  MCV 103.9*  PLT 696    Basic Metabolic Panel:  Recent Labs  Lab 09/30/19 1325  NA 140  K 5.2*  CL 97*  CO2 28  GLUCOSE 124*  BUN 44*  CREATININE 1.89*  CALCIUM 8.9   GFR: Estimated Creatinine Clearance: 25.9 mL/min (A) (by C-G formula based on SCr of 1.89 mg/dL (H)). Recent Labs  Lab 09/30/19 1325  WBC 29.6*     Liver Function Tests: Recent Labs  Lab 09/30/19 1325  AST 18  ALT 21  ALKPHOS 53  BILITOT 0.6  PROT 5.8*  ALBUMIN 3.0*   No results for input(s): LIPASE, AMYLASE in the last 168 hours. No results for input(s): AMMONIA in the last 168 hours.  ABG No results found for: PHART, PCO2ART, PO2ART, HCO3, TCO2, ACIDBASEDEF, O2SAT   Coagulation Profile: No results for input(s): INR, PROTIME in the last 168 hours.  Cardiac Enzymes: No results for input(s): CKTOTAL, CKMB, CKMBINDEX, TROPONINI in the last 168 hours.  HbA1C: Hgb A1c MFr Bld  Date/Time Value Ref Range Status  07/19/2017 06:16 AM 6.2 (H) 4.8 - 5.6 % Final    Comment:    (NOTE) Pre diabetes:          5.7%-6.4% Diabetes:              >6.4% Glycemic control for   <7.0% adults with diabetes     CBG: No results for input(s): GLUCAP in the last 168 hours.  Review of Systems:   REVIEW OF SYSTEMS:   All negative; except for those that are bolded, which indicate positives.  Constitutional: weight loss, weight gain, night sweats, fevers, chills, fatigue, weakness.  HEENT: headaches, sore throat, sneezing, nasal congestion, post nasal drip, difficulty swallowing, tooth/dental problems, visual complaints, visual changes, ear aches. Neuro: difficulty with speech, weakness, numbness, ataxia. CV:  chest pain, orthopnea, PND, swelling in lower extremities, dizziness, palpitations, syncope.  Resp: cough, hemoptysis, dyspnea, wheezing. GI: heartburn, indigestion, abdominal pain, nausea, vomiting, diarrhea, constipation, change in bowel habits, loss of appetite, hematemesis, melena, hematochezia.  GU: dysuria, change in color of urine, urgency or frequency, flank pain, hematuria. MSK: joint pain or swelling, decreased range of motion. Psych: change in mood or affect, depression, anxiety, suicidal ideations, homicidal ideations. Skin: rash, itching, bruising.  Past Medical History  She,  has a past medical history of  Anxiety, Diabetes mellitus without complication ([place]), Dyslipidemia, HTN (hypertension), Hyperlipemia, Hypothyroid, Hypothyroidism, Lung cancer ([place]) (2000), Obesity, Ovarian cancer ([place]), Palpitations (2009 and 2006), and Sleep apnea.   Surgical History    Past Surgical History:  Procedure Laterality Date  . ABDOMINAL HYSTERECTOMY  2001  . GRADED EXERCISE TOLERANCE TEST  08/2015    Blood pressure demonstrated a hypertensive response to exercise.  There was less than 0.66mm of horizontal ST segment depression in the inferolateral leads. No diagnostic criterial for ischemia. Moderately impaired exercise tolerance. The patient achieved 5.8 mets. This is a low risk study.  . IR FLUORO GUIDE PORT INSERTION RIGHT  08/17/2017  . IR THORACENTESIS ASP PLEURAL SPACE W/IMG GUIDE  07/04/2018  . IR US GUIDE VASC ACCESS RIGHT  08/17/2017  . LOBECTOMY  2000   RLL  . NM MYOCAR PERF WALL MOTION  08/20/2007   EF 74%  EXERCISE CAPACITY 7 METS  . OOPHORECTOMY  2001  . TONSILLECTOMY    . VIDEO BRONCHOSCOPY Bilateral 07/20/2017   Procedure: VIDEO BRONCHOSCOPY WITH FLUORO;  Surgeon: Collene Gobble, MD;  Location: [facility];  Service: Cardiopulmonary;  Laterality: Bilateral;     Social History   reports that she quit  smoking about 28 years ago. Her smoking use included cigarettes. She has a 22.50 pack-year smoking history. She has never used smokeless tobacco. She reports that she does not drink alcohol and does not use drugs.   Family History   Her family history includes CVA in her father; CVA (age of onset: 65) in her brother; Coronary artery disease (age of onset: 65) in her mother; Heart attack in her mother; Suicidality in her sister.   Allergies Allergies  Allergen Reactions  . Pembrolizumab Anaphylaxis  . Adhesive [Tape] Other (See Comments)    Irritates skin   . Codeine Other (See Comments)    Skin crawlinf feeling  . Lactose Intolerance (Gi) Diarrhea  . Monosodium Glutamate Diarrhea  . Sulfa  Antibiotics Nausea And Vomiting     Home Medications  Prior to Admission medications   Medication Sig Start Date End Date Taking? Authorizing Provider  amoxicillin-clavulanate (AUGMENTIN) 875-125 MG tablet Take 1 tablet by mouth every 12 (twelve) hours. Please take with food 09/20/19   Lauraine Rinne, NP  BREZTRI AEROSPHERE 160-9-4.8 MCG/ACT AERO Take 2 puffs by mouth 2 (two) times daily. 07/17/19   [provider]  cholecalciferol (VITAMIN D3) 25 MCG (1000 UT) tablet Take 1,000 Units by mouth daily.    [provider]  clorazepate (TRANXENE) 7.5 MG tablet Take 7.5 mg by mouth 2 (two) times daily as needed for anxiety.    [provider]  docusate sodium (COLACE) 100 MG capsule Take 100 mg by mouth 2 (two) times daily.    [provider]  ipratropium-albuterol (DUONEB) 0.5-2.5 (3) MG/3ML SOLN INHALE THE CONTENTS OF 1 VIAL VIA NEBULIZER 3 TIMES A DAY. 07/10/19   Icard, Octavio Graves, DO  levothyroxine (SYNTHROID) 112 MCG tablet Take 112 mcg by mouth daily. 04/17/19   [provider]  lidocaine-prilocaine (EMLA) cream Apply 1 application topically as needed. 08/10/17   Maryanna Shape, NP  methylPREDNISolone (MEDROL DOSEPAK) 4 MG TBPK tablet Take per package instructions. 09/11/19   Curt Bears, MD  metoprolol succinate (TOPROL-XL) 50 MG 24 hr tablet Take 50 mg by mouth 2 (two) times daily.     [provider]  Multiple Vitamins-Minerals (VISION FORMULA 2 PO) Take by mouth. OTC "Vision pills for Macular degeneration"    [provider]  predniSONE (DELTASONE) 10 MG tablet Take 4 tablets (40 mg total) by mouth daily with breakfast for 5 days, THEN 3 tablets (30 mg total) daily with breakfast for 5 days, THEN 2 tablets (20 mg total) daily with breakfast for 5 days, THEN 1 tablet (10 mg total) daily with breakfast for 5 days. 09/19/19 10/09/19  Lauraine Rinne, NP  rosuvastatin (CRESTOR) 10 MG tablet Take 10 mg by mouth every evening.     [provider]  valACYclovir (VALTREX) 500 MG tablet Take 500 mg by mouth daily.    [provider]  XARELTO 20 MG TABS tablet TAKE 1 TABLET BY MOUTH DAILY WITH SUPPER. 09/03/19   Curt Bears, MD     Critical care time: NA    Marshell Garfinkel MD [facility] Please see [email or web address] for pager details.  09/30/2019, 5:36 PM

## 2019-09-30 NOTE — ED Notes (Signed)
Patient back from CT.

## 2019-09-30 NOTE — H&P (Signed)
History and Physical    Erin Myers JOA:[id number] DOB: Feb 14, 1941 DOA: 09/30/2019  PCP: [name], MD  Patient coming from: Home  Chief Complaint: Worsening sob  HPI: Erin Myers is a 79 y.o. female with medical history significant of interstitial pneumonitis followed by Pulmonary as outpatient, HTN, HLD, DM, lung cancer currently undergoing chemo, hypothyroid who presents to the ED with complaints with acutely worse sob overnight. Chart reviewed. Pt currently undergoing chemo for cancer hx and had been calling regarding coughing since at least 6/21. Pt was earlier tried on prednisone with some improvement in cough, however sob worsened. Pt later saw Pulmonary on 7/8 with worsening cough and congestion. Patient was discharged home with steroids with plans to hold Keytruda infusions. On the evening of 7/18, pt noted markedly worse sob with nausea/vomiting afterwards, didn't recall aspirating. Later, pt reported with blood tinged sputum. Given worsening sob, pt presented to ED for further eval  ED Course: In the ED, pt noted to require 15L high flow O2. CXR was note to be largely unchanged. WBC noted to be in excess of 20k. Pt was given one dose of empiric broad spec abx in the ED and underwent CT chest. Pulmonary was consulted and hospitalist consulted for consideration for admission  Review of Systems:  Review of Systems  Constitutional: Positive for chills. Negative for fever and malaise/fatigue.  HENT: Negative for congestion, ear pain, nosebleeds and tinnitus.   Eyes: Negative for double vision, photophobia and pain.  Respiratory: Positive for cough, hemoptysis, sputum production, shortness of breath and wheezing.   Cardiovascular: Negative for palpitations, orthopnea and claudication.  Gastrointestinal: Positive for nausea and vomiting. Negative for abdominal pain and constipation.  Genitourinary: Negative for frequency and urgency.  Musculoskeletal: Negative for  back pain, falls and neck pain.  Neurological: Negative for tremors, sensory change, seizures and loss of consciousness.  Psychiatric/Behavioral: Negative for hallucinations and memory loss. The patient is not nervous/anxious.     Past Medical History:  Diagnosis Date  . Anxiety   . Diabetes mellitus without complication ([place])   . Dyslipidemia   . HTN (hypertension)   . Hyperlipemia   . Hypothyroid   . Hypothyroidism   . Lung cancer ([place]) 2000   Carcinoid, s/p RLL lobectomy  . Obesity   . Ovarian cancer ([place])   . Palpitations 2009 and 2006   Negative nuclear stress test   . Sleep apnea    on C-pap    Past Surgical History:  Procedure Laterality Date  . ABDOMINAL HYSTERECTOMY  2001  . GRADED EXERCISE TOLERANCE TEST  08/2015    Blood pressure demonstrated a hypertensive response to exercise.  There was less than 0.25mm of horizontal ST segment depression in the inferolateral leads. No diagnostic criterial for ischemia. Moderately impaired exercise tolerance. The patient achieved 5.8 mets. This is a low risk study.  . IR FLUORO GUIDE PORT INSERTION RIGHT  08/17/2017  . IR THORACENTESIS ASP PLEURAL SPACE W/IMG GUIDE  07/04/2018  . IR US GUIDE VASC ACCESS RIGHT  08/17/2017  . LOBECTOMY  2000   RLL  . NM MYOCAR PERF WALL MOTION  08/20/2007   EF 74%  EXERCISE CAPACITY 7 METS  . OOPHORECTOMY  2001  . TONSILLECTOMY    . VIDEO BRONCHOSCOPY Bilateral 07/20/2017   Procedure: VIDEO BRONCHOSCOPY WITH FLUORO;  Surgeon: Collene Gobble, MD;  Location: [facility];  Service: Cardiopulmonary;  Laterality: Bilateral;     reports that she quit smoking about 28 years ago. Her  smoking use included cigarettes. She has a 22.50 pack-year smoking history. She has never used smokeless tobacco. She reports that she does not drink alcohol and does not use drugs.  Allergies  Allergen Reactions  . Pembrolizumab Anaphylaxis  . Adhesive [Tape] Other (See Comments)    Irritates skin   . Codeine Other (See  Comments)    Skin crawlinf feeling  . Lactose Intolerance (Gi) Diarrhea  . Monosodium Glutamate Diarrhea  . Sulfa Antibiotics Nausea And Vomiting    Family History  Problem Relation Age of Onset  . Coronary artery disease Mother 24       died of an MI  . Heart attack Mother   . Suicidality Sister   . CVA Brother 99  . CVA Father     Prior to Admission medications   Medication Sig Start Date End Date Taking? Authorizing Provider  amoxicillin-clavulanate (AUGMENTIN) 875-125 MG tablet Take 1 tablet by mouth every 12 (twelve) hours. Please take with food 09/20/19   Lauraine Rinne, NP  BREZTRI AEROSPHERE 160-9-4.8 MCG/ACT AERO Take 2 puffs by mouth 2 (two) times daily. 07/17/19   [provider]  cholecalciferol (VITAMIN D3) 25 MCG (1000 UT) tablet Take 1,000 Units by mouth daily.    [provider]  clorazepate (TRANXENE) 7.5 MG tablet Take 7.5 mg by mouth 2 (two) times daily as needed for anxiety.    [provider]  docusate sodium (COLACE) 100 MG capsule Take 100 mg by mouth 2 (two) times daily.    [provider]  ipratropium-albuterol (DUONEB) 0.5-2.5 (3) MG/3ML SOLN INHALE THE CONTENTS OF 1 VIAL VIA NEBULIZER 3 TIMES A DAY. 07/10/19   Icard, Octavio Graves, DO  levothyroxine (SYNTHROID) 112 MCG tablet Take 112 mcg by mouth daily. 04/17/19   [provider]  lidocaine-prilocaine (EMLA) cream Apply 1 application topically as needed. 08/10/17   Maryanna Shape, NP  methylPREDNISolone (MEDROL DOSEPAK) 4 MG TBPK tablet Take per package instructions. 09/11/19   Curt Bears, MD  metoprolol succinate (TOPROL-XL) 50 MG 24 hr tablet Take 50 mg by mouth 2 (two) times daily.     [provider]  Multiple Vitamins-Minerals (VISION FORMULA 2 PO) Take by mouth. OTC "Vision pills for Macular degeneration"    [provider]  predniSONE (DELTASONE) 10 MG tablet Take 4 tablets (40 mg total) by mouth daily with breakfast for 5 days, THEN 3 tablets  (30 mg total) daily with breakfast for 5 days, THEN 2 tablets (20 mg total) daily with breakfast for 5 days, THEN 1 tablet (10 mg total) daily with breakfast for 5 days. 09/19/19 10/09/19  Lauraine Rinne, NP  rosuvastatin (CRESTOR) 10 MG tablet Take 10 mg by mouth every evening.     [provider]  valACYclovir (VALTREX) 500 MG tablet Take 500 mg by mouth daily.    [provider]  XARELTO 20 MG TABS tablet TAKE 1 TABLET BY MOUTH DAILY WITH SUPPER. 09/03/19   Curt Bears, MD    Physical Exam: Vitals:   09/30/19 1223 09/30/19 1226 09/30/19 1353 09/30/19 1437  BP: 126/76  (!) 122/42 (!) 126/51  Pulse: (!) 118  (!) 106 (!) 107  Resp: 20  (!) 26 19  Temp: 98.3 F (36.8 C)     TempSrc: Oral     SpO2: 92%  94% 98%  Weight:  76.2 kg    Height:  5\' 5"  (1.651 m)      Constitutional: NAD, calm, comfortable Vitals:  09/30/19 1223 09/30/19 1226 09/30/19 1353 09/30/19 1437  BP: 126/76  (!) 122/42 (!) 126/51  Pulse: (!) 118  (!) 106 (!) 107  Resp: 20  (!) 26 19  Temp: 98.3 F (36.8 C)     TempSrc: Oral     SpO2: 92%  94% 98%  Weight:  76.2 kg    Height:  5\' 5"  (1.651 m)     Eyes: PERRL, lids and conjunctivae normal ENMT: Mucous membranes are moist. Posterior pharynx clear of any exudate or lesions.Normal dentition.  Neck: normal, supple, no masses, no thyromegaly Respiratory: coarse breath sounds throughout, increased resp effort Cardiovascular: Regular rate and rhythm, s1, s2  Abdomen: no tenderness, no masses palpated. No hepatosplenomegaly. Bowel sounds positive.  Musculoskeletal: no clubbing / cyanosis. No joint deformity upper and lower extremities. Good ROM, no contractures. Normal muscle tone.  Skin: no rashes, lesions,. No induration Neurologic: CN 2-12 grossly intact. Sensation intact,. Strength 5/5 in all 4.  Psychiatric: Normal judgment and insight. Alert and oriented x 3. Normal mood.    Labs on Admission: I have personally reviewed following labs and  imaging studies  CBC: Recent Labs  Lab 09/30/19 1325  WBC 29.6*  HGB 13.2  HCT 43.1  MCV 103.9*  PLT 831   Basic Metabolic Panel: Recent Labs  Lab 09/30/19 1325  NA 140  K 5.2*  CL 97*  CO2 28  GLUCOSE 124*  BUN 44*  CREATININE 1.89*  CALCIUM 8.9   GFR: Estimated Creatinine Clearance: 25.9 mL/min (A) (by C-G formula based on SCr of 1.89 mg/dL (H)). Liver Function Tests: Recent Labs  Lab 09/30/19 1325  AST 18  ALT 21  ALKPHOS 53  BILITOT 0.6  PROT 5.8*  ALBUMIN 3.0*   No results for input(s): LIPASE, AMYLASE in the last 168 hours. No results for input(s): AMMONIA in the last 168 hours. Coagulation Profile: No results for input(s): INR, PROTIME in the last 168 hours. Cardiac Enzymes: No results for input(s): CKTOTAL, CKMB, CKMBINDEX, TROPONINI in the last 168 hours. BNP (last 3 results) No results for input(s): PROBNP in the last 8760 hours. HbA1C: No results for input(s): HGBA1C in the last 72 hours. CBG: No results for input(s): GLUCAP in the last 168 hours. Lipid Profile: No results for input(s): CHOL, HDL, LDLCALC, TRIG, CHOLHDL, LDLDIRECT in the last 72 hours. Thyroid Function Tests: No results for input(s): TSH, T4TOTAL, FREET4, T3FREE, THYROIDAB in the last 72 hours. Anemia Panel: No results for input(s): VITAMINB12, FOLATE, FERRITIN, TIBC, IRON, RETICCTPCT in the last 72 hours. Urine analysis:    Component Value Date/Time   COLORURINE YELLOW 07/18/2017 1650   APPEARANCEUR CLEAR 07/18/2017 1650   LABSPEC 1.005 07/18/2017 1650   PHURINE 7.0 07/18/2017 1650   GLUCOSEU NEGATIVE 07/18/2017 1650   HGBUR NEGATIVE 07/18/2017 1650   BILIRUBINUR NEGATIVE 07/18/2017 1650   KETONESUR NEGATIVE 07/18/2017 1650   PROTEINUR NEGATIVE 07/18/2017 1650   NITRITE NEGATIVE 07/18/2017 1650   LEUKOCYTESUR LARGE (A) 07/18/2017 1650   Sepsis Labs: !!!!!!!!!!!!!!!!!!!!!!!!!!!!!!!!!!!!!!!!!!!! @LABRCNTIP (procalcitonin:4,lacticidven:4) )No results found for this or  any previous visit (from the past 240 hour(s)).   Radiological Exams on Admission: DG Chest Port 1 View  Result Date: 09/30/2019 CLINICAL DATA:  Shortness of breath EXAM: PORTABLE CHEST 1 VIEW COMPARISON:  09/20/2019 FINDINGS: Right chest wall port catheter tip overlies the superior right atrium. Bibasilar atelectasis. Mild interstitial prominence with hazy opacities, right greater than left. No significant pleural effusion. Stable cardiomediastinal contours. IMPRESSION: Similar nonspecific interstitial prominence and hazy opacities.  Electronically Signed   By: Macy Mis M.D.   On: 09/30/2019 13:52    EKG: Independently reviewed. Sinus tach  Assessment/Plan Principal Problem:   Acute hypoxemic respiratory failure (HCC) Active Problems:   OSA on CPAP   Essential hypertension   Dyslipidemia, goal LDL below 130   Family history of coronary artery disease   Hx of malignant carcinoid tumor   Sepsis ([place])   Adenocarcinoma of right lung, stage 4 (HCC)   Pneumonitis   Pneumonitis, interstitial ([place])  1. Acute on chronic hypoxemic respiratory failure 1. Chart reviewed, pt is followed by Pulmonary as an outpatient, was recently seen on 7/8 for worsening sob. On that visit, there were concerns of intersitial pneumonitis, prescribed course of prednisone 2. Currently requiring 15L [place], baseline is 4LNC 3. CXR reviewed, read as unchanged. Per my read, seems to have increased B patchy infiltrates with B mild-mod effusions 4. CT chest w/o contrast ordered by EDP, pending 5. Given markedly increased O2 requirements and leukocytosis,pt was given broad spec abx in ED, will continue 6. Will order IV solumedrol  7. F/u CBC and CMP in AM 8. Pulmonary consulted by EDP 2. HTN 1. BP stable and controlled thus far 3. Hx malignant carcinoid tumor, adenocarcinoma R lung stage 4 1. Followed by Dr. [name] and Pulmonary 2. Currently undergoing chemo as outpatient 4. Intersitial pneumonitis 1. Followed  by pulmonary as outpatient 2. Per pulmonary, suspect secondary to hx of chemo  DVT prophylaxis: xarelto  Code Status: Full, confirmed with patient Family Communication: Pt in room  Disposition Plan: Uncertain at this time  Consults called: Pulmonary Admission status: Inpatient as would likely require greater than 2 midnight stay to treat acute hypoxemic failure   Marylu Lund MD [facility] Pager On Amion  If 7PM-7AM, please contact night-coverage  09/30/2019, 3:06 PM

## 2019-09-30 NOTE — ED Notes (Signed)
Pt BIB EMS. Pt complaining of SOB, chills and vomiting; hx of right upper lung cancer; pt states she vomited 3x and the last time had a small amount of blood in it. Pt reports having rattling sound within her lungs for years but got worse last night, other sxs all started last night. Pt denies being around anyone with covid. Pt normally on 4LNC stating 93%; but was stating in the 80s today on 4LN. EMS placed pt on 5LNC and pt was stating at 93%.   BP-120/70 Temp-99 HR-120 O2-93% on 5L [place]

## 2019-09-30 NOTE — ED Notes (Signed)
Patient to CT.

## 2019-09-30 NOTE — Telephone Encounter (Signed)
Patient husband contacted. Patient sats at 86% on 5 liters, unable to increase oxygen. He reports she is short of breath, wheezing, crackles, and sputum is pink and white, also patient is pale. Advise from Eric Form NP to the emergency department with history of lung cancer and symptoms. Sent to [facility] ED. Husband verbalized understanding and is taking her as soon as we are off the phone.

## 2019-09-30 NOTE — Progress Notes (Signed)
ANTICOAGULATION CONSULT NOTE - Initial Consult  Pharmacy Consult for rivaroxaban Indication: hx VTE  Allergies  Allergen Reactions   Pembrolizumab Anaphylaxis   Adhesive [Tape] Other (See Comments)    Irritates skin    Codeine Other (See Comments)    Skin crawlinf feeling   Lactose Intolerance (Gi) Diarrhea   Monosodium Glutamate Diarrhea   Sulfa Antibiotics Nausea And Vomiting    Patient Measurements: Height: 5\' 5"  (165.1 cm) Weight: 76.2 kg (168 lb) IBW/kg (Calculated) : 57  Vital Signs: Temp: 98.3 F (36.8 C) (07/19 1223) Temp Source: Oral (07/19 1223) BP: 131/54 (07/19 1644) Pulse Rate: 99 (07/19 1644)  Labs: Recent Labs    09/30/19 1325 09/30/19 1607  HGB 13.2  --   HCT 43.1  --   PLT 217  --   CREATININE 1.89*  --   TROPONINIHS 31* 24*    Estimated Creatinine Clearance: 25.9 mL/min (A) (by C-G formula based on SCr of 1.89 mg/dL (H)).   Medical History: Past Medical History:  Diagnosis Date   Anxiety    Diabetes mellitus without complication ([place])    Dyslipidemia    HTN (hypertension)    Hyperlipemia    Hypothyroid    Hypothyroidism    Lung cancer ([place]) 2000   Carcinoid, s/p RLL lobectomy   Obesity    Ovarian cancer ([place])    Palpitations 2009 and 2006   Negative nuclear stress test    Sleep apnea    on C-pap    Medications: Rivaroxaban 20 mg PO daily with supper PTA   Assessment: Pt is a 14 yoF presenting with worsening SOB. PMH significant for interstitial pneumonitis, lung cancer currently on chemotherapy, history of VTE currently prescribed rivaroxaban 20 mg PO daily PTA. Pt noted to have some hemoptysis on admission, pharmacy consulted to continue rivaroxaban.   Today, 09/30/19  CBC: Hgb & Plt WNL  SCr 1.89, slightly elevated from baseline ~1.3. CrCl using actual body weight = 30.5 mL/min  Last dose of rivaroxaban reported on 7/18   Plan:   Continue home dose of rivaroxaban 20 mg PO daily with supper  Monitor  renal function  Monitor CBC; signs of bleeding  Lenis Noon, PharmD 09/30/2019,5:11 PM

## 2019-09-30 NOTE — ED Provider Notes (Signed)
[facility] Emergency Department Provider Note MRN:  [id number]  Arrival date & time: 09/30/19     Chief Complaint   Shortness of Breath   History of Present Illness   Erin Myers is a 79 y.o. year-old female with a history of hypertension, diabetes, lung cancer presenting to the ED with chief complaint of shortness of breath.  Gradual onset shortness of breath last night, persistent and progressively worsening since that time.  Associated with cough, chills, hemoptysis.  Denies chest pain, no abdominal pain, no other complaints.  Symptoms are moderate to severe.  Review of Systems  A complete 10 system review of systems was obtained and all systems are negative except as noted in the HPI and PMH.   Patient's Health History    Past Medical History:  Diagnosis Date  . Anxiety   . Diabetes mellitus without complication ([place])   . Dyslipidemia   . HTN (hypertension)   . Hyperlipemia   . Hypothyroid   . Hypothyroidism   . Lung cancer ([place]) 2000   Carcinoid, s/p RLL lobectomy  . Obesity   . Ovarian cancer ([place])   . Palpitations 2009 and 2006   Negative nuclear stress test   . Sleep apnea    on C-pap    Past Surgical History:  Procedure Laterality Date  . ABDOMINAL HYSTERECTOMY  2001  . GRADED EXERCISE TOLERANCE TEST  08/2015    Blood pressure demonstrated a hypertensive response to exercise.  There was less than 0.73mm of horizontal ST segment depression in the inferolateral leads. No diagnostic criterial for ischemia. Moderately impaired exercise tolerance. The patient achieved 5.8 mets. This is a low risk study.  . IR FLUORO GUIDE PORT INSERTION RIGHT  08/17/2017  . IR THORACENTESIS ASP PLEURAL SPACE W/IMG GUIDE  07/04/2018  . IR US GUIDE VASC ACCESS RIGHT  08/17/2017  . LOBECTOMY  2000   RLL  . NM MYOCAR PERF WALL MOTION  08/20/2007   EF 74%  EXERCISE CAPACITY 7 METS  . OOPHORECTOMY  2001  . TONSILLECTOMY    . VIDEO BRONCHOSCOPY Bilateral  07/20/2017   Procedure: VIDEO BRONCHOSCOPY WITH FLUORO;  Surgeon: Collene Gobble, MD;  Location: [facility];  Service: Cardiopulmonary;  Laterality: Bilateral;    Family History  Problem Relation Age of Onset  . Coronary artery disease Mother 68       died of an MI  . Heart attack Mother   . Suicidality Sister   . CVA Brother 16  . CVA Father     Social History   Socioeconomic History  . Marital status: Married    Spouse name: Not on file  . Number of children: 2  . Years of education: HS  . Highest education level: Not on file  Occupational History  . Not on file  Tobacco Use  . Smoking status: Former Smoker    Packs/day: 0.75    Years: 30.00    Pack years: 22.50    Types: Cigarettes    Quit date: 08/18/1991    Years since quitting: 28.1  . Smokeless tobacco: Never Used  Substance and Sexual Activity  . Alcohol use: No    Alcohol/week: 0.0 standard drinks  . Drug use: No  . Sexual activity: Yes    Partners: Male  Other Topics Concern  . Not on file  Social History Narrative   1-2 glasses of tea a day    Married mother of 2 (daughter close to 41 and son  23-68 years old).  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   Social Determinants of Health   Financial Resource Strain:   . Difficulty of Paying Living Expenses:   Food Insecurity:   . Worried About Charity fundraiser in the Last Year:   . Arboriculturist in the Last Year:   Transportation Needs:   . Film/video editor (Medical):   [name] Lack of Transportation (Non-Medical):   Physical Activity:   . Days of Exercise per Week:   . Minutes of Exercise per Session:   Stress:   . Feeling of Stress :   Social Connections:   . Frequency of Communication with Friends and Family:   . Frequency of Social Gatherings with Friends and Family:   . Attends Religious Services:   . Active Member of Clubs or Organizations:   . Attends Archivist  Meetings:   [name] Marital Status:   Intimate Partner Violence:   . Fear of Current or Ex-Partner:   . Emotionally Abused:   [name] Physically Abused:   . Sexually Abused:      Physical Exam   Vitals:   09/30/19 1223 09/30/19 1353  BP: 126/76 (!) 122/42  Pulse: (!) 118 (!) 106  Resp: 20 (!) 26  Temp: 98.3 F (36.8 C)   SpO2: 92% 94%    CONSTITUTIONAL: Well-appearing, NAD NEURO:  Alert and oriented x 3, no focal deficits EYES:  eyes equal and reactive ENT/NECK:  no LAD, no JVD CARDIO: Regular rate, well-perfused, normal S1 and S2 PULM: Diffuse rhonchi, mildly tachypneic GI/GU:  normal bowel sounds, non-distended, non-tender MSK/SPINE:  No gross deformities, no edema SKIN:  no rash, atraumatic PSYCH:  Appropriate speech and behavior  *Additional and/or pertinent findings included in MDM below  Diagnostic and Interventional Summary    EKG Interpretation  Date/Time:  Monday September 30 2019 12:26:09 EDT Ventricular Rate:  123 PR Interval:    QRS Duration: 81 QT Interval:  293 QTC Calculation: 420 R Axis:   26 Text Interpretation: Sinus tachycardia Ventricular premature complex Aberrant complex Confirmed by Gerlene Fee [phone]) on 09/30/2019 12:38:42 PM      Labs Reviewed  CBC - Abnormal; Notable for the following components:      Result Value   WBC 29.6 (*)    MCV 103.9 (*)    RDW 16.7 (*)    All other components within normal limits  COMPREHENSIVE METABOLIC PANEL - Abnormal; Notable for the following components:   Potassium 5.2 (*)    Chloride 97 (*)    Glucose, Bld 124 (*)    BUN 44 (*)    Creatinine, Ser 1.89 (*)    Total Protein 5.8 (*)    Albumin 3.0 (*)    GFR calc non Af Amer 25 (*)    GFR calc Af Amer 29 (*)    All other components within normal limits  TROPONIN I (HIGH SENSITIVITY) - Abnormal; Notable for the following components:   Troponin I (High Sensitivity) 31 (*)    All other components within normal limits  CULTURE, BLOOD (SINGLE)  SARS CORONAVIRUS  2 BY RT PCR (HOSPITAL ORDER, [place] [facility])  BRAIN NATRIURETIC PEPTIDE  TROPONIN I (HIGH SENSITIVITY)    DG Chest Port 1 View  Final Result    CT Chest Wo Contrast    (Results Pending)    Medications  cefTRIAXone (ROCEPHIN) 2 g  in sodium chloride 0.9 % 100 mL IVPB (has no administration in time range)  azithromycin (ZITHROMAX) 500 mg in sodium chloride 0.9 % 250 mL IVPB (has no administration in time range)  sodium chloride 0.9 % bolus 500 mL (500 mLs Intravenous New Bag/Given 09/30/19 1342)     Procedures  /  Critical Care .Critical Care Performed by: Maudie Flakes, MD Authorized by: Maudie Flakes, MD   Critical care provider statement:    Critical care time (minutes):  45   Critical care was necessary to treat or prevent imminent or life-threatening deterioration of the following conditions: Hypoxic respiratory failure.   Critical care was time spent personally by me on the following activities:  Discussions with consultants, evaluation of patient's response to treatment, examination of patient, ordering and performing treatments and interventions, ordering and review of laboratory studies, ordering and review of radiographic studies, pulse oximetry, re-evaluation of patient's condition, obtaining history from patient or surrogate and review of old charts    ED Course and Medical Decision Making  I have reviewed the triage vital signs, the nursing notes, and pertinent available records from the EMR.  Listed above are laboratory and imaging tests that I personally ordered, reviewed, and interpreted and then considered in my medical decision making (see below for details).      Concern for pulmonary embolism versus pneumonia versus complication related to known lung cancer.  Requiring nonrebreather to maintain oxygen in the low 90s.  Seems overall relatively comfortable on nonrebreather, will transition to high flow nasal cannula.  Awaiting chest x-ray,  labs, likely will need CT imaging.  Labs reveal worsening kidney function and therefore contrast imaging to be withheld at this time.  Patient is comfortable on 15 L high flow nasal cannula.  Will obtain noncontrast imaging to evaluate for bleeding neoplasm or pneumonia, admitted to hospitalist service for further care, likely stepdown.  Barth Kirks. Sedonia Small, [place] [email or web address]  Final Clinical Impressions(s) / ED Diagnoses     ICD-10-CM   1. Acute respiratory failure with hypoxia (HCC)  J96.01     ED Discharge Orders    None       Discharge Instructions Discussed with and Provided to Patient:   Discharge Instructions   None       Maudie Flakes, MD 09/30/19 1433

## 2019-09-30 NOTE — Progress Notes (Signed)
Pt on 100% Non-Rebreather Mask with Sat of 93%. Changed to [place] High flow Nasal Cannula of 15 lpm, Sat of 92%.RT will continue to monitor.

## 2019-10-01 LAB — COMPREHENSIVE METABOLIC PANEL
ALT: 18 U/L (ref 0–44)
AST: 17 U/L (ref 15–41)
Albumin: 2.8 g/dL — ABNORMAL LOW (ref 3.5–5.0)
Alkaline Phosphatase: 51 U/L (ref 38–126)
Anion gap: 10 (ref 5–15)
BUN: 40 mg/dL — ABNORMAL HIGH (ref 8–23)
CO2: 29 mmol/L (ref 22–32)
Calcium: 8.9 mg/dL (ref 8.9–10.3)
Chloride: 104 mmol/L (ref 98–111)
Creatinine, Ser: 1.55 mg/dL — ABNORMAL HIGH (ref 0.44–1.00)
GFR calc Af Amer: 37 mL/min — ABNORMAL LOW (ref >60)
GFR calc non Af Amer: 32 mL/min — ABNORMAL LOW (ref >60)
Glucose, Bld: 152 mg/dL — ABNORMAL HIGH (ref 70–99)
Potassium: 5 mmol/L (ref 3.5–5.1)
Sodium: 143 mmol/L (ref 135–145)
Total Bilirubin: 0.7 mg/dL (ref 0.3–1.2)
Total Protein: 5.9 g/dL — ABNORMAL LOW (ref 6.5–8.1)

## 2019-10-01 LAB — CBC
HCT: 39.6 % (ref 36.0–46.0)
Hemoglobin: 12 g/dL (ref 12.0–15.0)
MCH: 31.7 pg (ref 26.0–34.0)
MCHC: 30.3 g/dL (ref 30.0–36.0)
MCV: 104.8 fL — ABNORMAL HIGH (ref 80.0–100.0)
Platelets: 170 10*3/uL (ref 150–400)
RBC: 3.78 MIL/uL — ABNORMAL LOW (ref 3.87–5.11)
RDW: 16.7 % — ABNORMAL HIGH (ref 11.5–15.5)
WBC: 17 10*3/uL — ABNORMAL HIGH (ref 4.0–10.5)
nRBC: 0 % (ref 0.0–0.2)

## 2019-10-01 LAB — MRSA PCR SCREENING: MRSA by PCR: NEGATIVE

## 2019-10-01 LAB — PROCALCITONIN: Procalcitonin: 30.97 ng/mL

## 2019-10-01 MED ORDER — SODIUM CHLORIDE 0.9% FLUSH
10.0000 mL | Freq: Two times a day (BID) | INTRAVENOUS | Status: DC
  Administered 2019-10-01 – 2019-10-11 (×12): 10 mL

## 2019-10-01 MED ORDER — CLORAZEPATE DIPOTASSIUM 3.75 MG PO TABS
3.7500 mg | ORAL_TABLET | Freq: Two times a day (BID) | ORAL | Status: DC
  Administered 2019-10-01 – 2019-10-12 (×22): 3.75 mg via ORAL
  Filled 2019-10-01 (×21): qty 1

## 2019-10-01 MED ORDER — ORAL CARE MOUTH RINSE
15.0000 mL | Freq: Two times a day (BID) | OROMUCOSAL | Status: DC
  Administered 2019-10-01 – 2019-10-12 (×21): 15 mL via OROMUCOSAL

## 2019-10-01 MED ORDER — SODIUM CHLORIDE 0.9% FLUSH
10.0000 mL | INTRAVENOUS | Status: DC | PRN
  Administered 2019-10-12: 10 mL

## 2019-10-01 MED ORDER — CHLORHEXIDINE GLUCONATE CLOTH 2 % EX PADS
6.0000 | MEDICATED_PAD | Freq: Every day | CUTANEOUS | Status: DC
  Administered 2019-10-01 – 2019-10-11 (×10): 6 via TOPICAL

## 2019-10-01 MED ORDER — BUDESON-GLYCOPYRROL-FORMOTEROL 160-9-4.8 MCG/ACT IN AERO: 2.0000 | INHALATION_SPRAY | Freq: Two times a day (BID) | RESPIRATORY_TRACT | Status: DC

## 2019-10-01 MED ORDER — VALACYCLOVIR HCL 500 MG PO TABS
500.0000 mg | ORAL_TABLET | Freq: Every day | ORAL | Status: DC
  Administered 2019-10-01 – 2019-10-12 (×11): 500 mg via ORAL
  Filled 2019-10-01 (×13): qty 1

## 2019-10-01 MED ORDER — IPRATROPIUM-ALBUTEROL 0.5-2.5 (3) MG/3ML IN SOLN
3.0000 mL | Freq: Four times a day (QID) | RESPIRATORY_TRACT | Status: DC
  Administered 2019-10-01 – 2019-10-03 (×8): 3 mL via RESPIRATORY_TRACT
  Filled 2019-10-01 (×8): qty 3

## 2019-10-01 NOTE — Consult Note (Signed)
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  PCCM consulted for help with management  History of present illness   Lung cancer diagnosed in May 2019, treated with systemic chemotherapy and [place], Keytruda. She developed interstitial pneumonitis secondary to [facility] earlier this year treated with prednisone and Keytruda therapy was on hold. She has received several rounds of prednisone recently for immunotherapy related pneumonitis  Past Medical History    has a past medical history of Anxiety, Diabetes mellitus without complication ([place]), Dyslipidemia, HTN (hypertension), Hyperlipemia, Hypothyroid, Hypothyroidism, Lung cancer ([place]) (2000), Obesity, Ovarian cancer ([place]), Palpitations (2009 and 2006), and Sleep apnea.  Significant Hospital Events   7/19 admit  Consults:  PCCM  Procedures:    Significant Diagnostic Tests:  CT chest 09/30/2019-increasing bilateral lower lobe consolidation with nodular appearing areas in the upper lobe.  I have reviewed the images personally.   Micro Data:  Blood culture 7/19  Antimicrobials:  Ceftriaxone 7/19 Azithromycin 7/19  Interim history/subjective:   7/20: weaned to 8L at this time hfnc, sitting up in bed in nad. Audible rhonchi. Pink tinged sputum. Feels improved but would like her port accessed and piv removed.  Objective   Blood pressure (!) 161/120, pulse 89, temperature 98.3 F (36.8 C), temperature source Oral, resp. rate 15, height 5\' 5"  (1.651 m), weight 76.2 kg, SpO2 93 %.        Intake/Output Summary (Last 24 hours) at 10/01/2019 1804 Last data filed  at 10/01/2019 1741 Gross per 24 hour  Intake 64.16 ml  Output --  Net 64.16 ml   Filed Weights   09/30/19 1226  Weight: 76.2 kg    Examination: Blood pressure (!) 131/54, pulse 99, temperature 98.3 F (36.8 C), temperature source Oral, resp. rate (!) 21, height 5\' 5"  (1.651 m), weight 76.2 kg, SpO2 100 %. Gen:      No acute distress, reclining comfortably in bed in nad HEENT:  NCAT, PERRL EOMI, sclera anicteric Neck:     No masses; no thyromegaly Lungs:    Scattered rhonchi CV:         Regular rate and rhythm; no murmurs Abd:      + bowel sounds; soft, non-tender; no palpable masses, no distension Ext:    No edema; adequate peripheral perfusion Skin:      Warm and dry; no rash Neuro: alert and oriented x 3 Psych: normal mood and affect  Resolved Hospital Problem list     Assessment & Plan:  Acute hypoxic respiratory failure with worsening bilateral lung infiltrates History of stage IV lung cancer Recurrent immunotherapy related pneumonitis  Differential diagnosis at this point includes pneumonia versus immunotherapy related pneumonitis or progression of lung cancer  -cont IV antibiotics -Continue Solu-Medrol.   -procalcitonin 30, will trend -follow cultures, urine pneumococcus and Legionella test (still needing to be collected) -provide flutter, advised to notify should sputum change from pink tinged to other bleeding.   Ideally we would do a bronchoscopy but she has too high oxygen requirements at present to do this safely, if she continues to make improvement in oxygen requirement this could become possible early next week, but would need to transition off xarelto to heparin infusion  ideally  Pulmonary embolism Continue a/c -consider transitioning to heparin if able to wean oxygen for ideal bronch (will touch base with pulmonary counterparts who would do elective bronch to inquire if needed)  Best practice:  Diet: PO Pain/Anxiety/Delirium protocol (if indicated):  NA VAP protocol (if indicated): NA DVT prophylaxis: on home a/c GI prophylaxis: NA Glucose control: Monitor Mobility: Bed Code Status: Full Family Communication: Patient updated Disposition: intermediate care unit.   Labs   CBC: Recent Labs  Lab 09/30/19 1325 10/01/19 0450  WBC 29.6* 17.0*  HGB 13.2 12.0  HCT 43.1 39.6  MCV 103.9* 104.8*  PLT 217 209    Basic Metabolic Panel: Recent Labs  Lab 09/30/19 1325 10/01/19 0450  NA 140 143  K 5.2* 5.0  CL 97* 104  CO2 28 29  GLUCOSE 124* 152*  BUN 44* 40*  CREATININE 1.89* 1.55*  CALCIUM 8.9 8.9   GFR: Estimated Creatinine Clearance: 31.5 mL/min (A) (by C-G formula based on SCr of 1.55 mg/dL (H)). Recent Labs  Lab 09/30/19 1325 10/01/19 0450  PROCALCITON  --  30.97  WBC 29.6* 17.0*    Liver Function Tests: Recent Labs  Lab 09/30/19 1325 10/01/19 0450  AST 18 17  ALT 21 18  ALKPHOS 53 51  BILITOT 0.6 0.7  PROT 5.8* 5.9*  ALBUMIN 3.0* 2.8*   No results for input(s): LIPASE, AMYLASE in the last 168 hours. No results for input(s): AMMONIA in the last 168 hours.  ABG No results found for: PHART, PCO2ART, PO2ART, HCO3, TCO2, ACIDBASEDEF, O2SAT   Coagulation Profile: No results for input(s): INR, PROTIME in the last 168 hours.  Cardiac Enzymes: No results for input(s): CKTOTAL, CKMB, CKMBINDEX, TROPONINI in the last 168 hours.  HbA1C: Hgb A1c MFr Bld  Date/Time Value Ref Range Status  07/19/2017 06:16 AM 6.2 (H) 4.8 - 5.6 % Final    Comment:    (NOTE) Pre diabetes:          5.7%-6.4% Diabetes:              >6.4% Glycemic control for   <7.0% adults with diabetes     CBG: No results for input(s): GLUCAP in the last 168 hours.  Critical care time: The patient is critically ill with multiple organ systems failure and requires high complexity decision making for assessment and support, frequent evaluation and titration of therapies, application of advanced monitoring technologies and extensive  interpretation of multiple databases.  Critical care time 32 mins. This represents my time independent of the NPs time taking care of the pt. This is excluding procedures.    [facility] 10/01/2019, 6:05 PM

## 2019-10-01 NOTE — Progress Notes (Addendum)
PROGRESS NOTE    Erin Myers  ZWC:[id number]  DOB: 20-Apr-1940  DOA: 09/30/2019 PCP: [name], MD Outpatient Specialists:   Hospital course:  Erin Myers is a 79 y.o. female  with PMH significant for lung cancer on chemotherapy, interstitial lung disease and COPD who was admitted 09/30/2019 with increasing shortness of breath, cough and some mild hemoptysis.  Patient had previously been seen by pulmonary complaining of cough and had been treated with increasing steroids with plan to hold Keytruda infusion.  However shortness of breath progress and patient was seen in the ED.  Work-up in the ED revealed marked hypoxia with need for 15 L high flow [place].  Chest x-ray showed bilateral infiltrates which were unchanged difficult to read.  Chest CT showed increasing bilateral consolidation at bases.  Subsequent procalcitonin came back at 30 and WBC was noted to be greater than 20.  Patient was admitted for CAP and possible worsening of interstitial process secondary to [facility].  Patient was seen by pulmonary critical care who increased her steroid dose and recommended ongoing treatment with antibiotics.  Subjective:  Patient states that she feels much better this morning.  Notes her cough is improved and that she can speak now without gasping.  Although she knows that she is still on increased doses of oxygen, patient states she feels almost at baseline other than feeling somewhat weak.  She is working to expectorate and is coughing especially can.  She still has faint hemoptysis which she showed me.   Objective: Vitals:   10/01/19 1304 10/01/19 1358 10/01/19 1428 10/01/19 1459  BP: (!) 120/50 (!) 121/53 (!) 128/46 (!) 117/50  Pulse: 82 77 79 79  Resp: 15 17 (!) 25 15  Temp:      TempSrc:      SpO2: 94% 96% 97% 97%  Weight:      Height:       No intake or output data in the 24 hours ending 10/01/19 1655 Filed Weights   09/30/19 1226  Weight: 76.2 kg      Exam:  General: Obese, barrel chested female sitting up in bed with nasal cannula in place.  She is tachypneic but unlabored.  She is able to speak in full sentences without difficulty and is laughing and joking with her husband. Eyes: sclera anicteric, conjuctiva mild injection bilaterally CVS: S1-S2, regular  Respiratory: Patient has coarse fibrotic crackles throughout all lung fields bilaterally.  She has low expiratory wheezes bilaterally as well. GI: Obese, NABS, soft, NT  LE: No edema.  Neuro: A/O x 3, Moving all extremities equally with normal strength, CN 3-12 intact, grossly nonfocal.  Psych: patient is logical and coherent, judgement and insight appear normal, mood and affect appropriate to situation.   Assessment & Plan:   Acute on chronic respiratory failure likely secondary to CAP superimposed on interstitial lung disease and lung cancer Patient is clinically improved with treatment with ceftriaxone and azithromycin day #2 today WBC has decreased to 17 down from 30 yesterday Steroids were increased by pulmonary critical care Dr. Vaughan Browner We will continue present course of treatment with steroids, antibiotics and oxygen Will restart Breztri and add as needed duo nebs given low pitched wheezes  ARF Improving with treatment of infection and fluid resuscitation  Hyperkalemia Resolved  HTN Continue Toprol-XL  Anxiety Continue Tranxene  History of VTE Continue Xarelto  Lung cancer Ongoing treatment per Dr. [name]  Hypothyroidism Continue Synthroid   DVT prophylaxis: On Xarelto for previous history  of VTE Code Status: Full Family Communication: Patient's husband was at bedside throughout Disposition Plan:   Patient is from: Home  Anticipated Discharge Location: Home  Barriers to Discharge: Ongoing acute hypoxic respiratory failure  Is patient medically stable for Discharge: No   Consultants:  Pulmonary critical  care  Procedures:  None  Antimicrobials:  Azithromycin day #2  Ceftriaxone day #2   Data Reviewed:  Basic Metabolic Panel: Recent Labs  Lab 09/30/19 1325 10/01/19 0450  NA 140 143  K 5.2* 5.0  CL 97* 104  CO2 28 29  GLUCOSE 124* 152*  BUN 44* 40*  CREATININE 1.89* 1.55*  CALCIUM 8.9 8.9   Liver Function Tests: Recent Labs  Lab 09/30/19 1325 10/01/19 0450  AST 18 17  ALT 21 18  ALKPHOS 53 51  BILITOT 0.6 0.7  PROT 5.8* 5.9*  ALBUMIN 3.0* 2.8*   No results for input(s): LIPASE, AMYLASE in the last 168 hours. No results for input(s): AMMONIA in the last 168 hours. CBC: Recent Labs  Lab 09/30/19 1325 10/01/19 0450  WBC 29.6* 17.0*  HGB 13.2 12.0  HCT 43.1 39.6  MCV 103.9* 104.8*  PLT 217 170   Cardiac Enzymes: No results for input(s): CKTOTAL, CKMB, CKMBINDEX, TROPONINI in the last 168 hours. BNP (last 3 results) No results for input(s): PROBNP in the last 8760 hours. CBG: No results for input(s): GLUCAP in the last 168 hours.  Recent Results (from the past 240 hour(s))  Culture, blood (single) w Reflex to ID Panel     Status: None (Preliminary result)   Collection Time: 09/30/19  1:27 PM   Specimen: BLOOD  Result Value Ref Range Status   Specimen Description   Final    BLOOD RIGHT ANTECUBITAL Performed at [facility] [address], [place], [place] [address]    Special Requests   Final    BOTTLES DRAWN AEROBIC AND ANAEROBIC Blood Culture adequate volume Performed at [facility] [address] [place], [place] [address]    Culture   Final    NO GROWTH < 24 HOURS Performed at [facility] [address] [place], [place] [address]    Report Status PENDING  Incomplete  SARS Coronavirus 2 by RT PCR (hospital order, performed in [facility] hospital lab) Nasopharyngeal Nasopharyngeal Swab     Status: None   Collection Time: 09/30/19  3:10 PM   Specimen: Nasopharyngeal Swab  Result Value Ref  Range Status   SARS Coronavirus 2 NEGATIVE NEGATIVE Final    Comment: (NOTE) SARS-CoV-2 target nucleic acids are NOT DETECTED.  The SARS-CoV-2 RNA is generally detectable in upper and lower respiratory specimens during the acute phase of infection. The lowest concentration of SARS-CoV-2 viral copies this assay can detect is 250 copies / mL. A negative result does not preclude SARS-CoV-2 infection and should not be used as the sole basis for treatment or other patient management decisions.  A negative result may occur with improper specimen collection / handling, submission of specimen other than nasopharyngeal swab, presence of viral mutation(s) within the areas targeted by this assay, and inadequate number of viral copies (<250 copies / mL). A negative result must be combined with clinical observations, patient history, and epidemiological information.  Fact Sheet for Patients:   StrictlyIdeas.no  Fact Sheet for Healthcare Providers: BankingDealers.co.za  This test is not yet approved or  cleared by the [place] FDA and has been authorized for detection and/or diagnosis of SARS-CoV-2 by  FDA under an Emergency Use Authorization (EUA).  This EUA will remain in effect (meaning this test can be used) for the duration of the COVID-19 declaration under Section 564(b)(1) of the Act, 21 U.S.C. section 360bbb-3(b)(1), unless the authorization is terminated or revoked sooner.  Performed at [facility], [place] [address], [place], [place] [address]       Studies: CT Chest Wo Contrast  Result Date: 09/30/2019 CLINICAL DATA:  Increasing shortness of breath EXAM: CT CHEST WITHOUT CONTRAST TECHNIQUE: Multidetector CT imaging of the chest was performed following the standard protocol without IV contrast. COMPARISON:  Chest x-ray from earlier in the same day, CT from 08/16/2019 FINDINGS: Cardiovascular: Somewhat limited due  to lack of IV contrast. Aortic calcifications are noted. No aneurysmal dilatation is seen. No cardiac enlargement is noted. No pericardial effusion is seen. Right chest wall port is noted. Mediastinum/Nodes: Thoracic inlet is within normal limits. Scattered tiny mediastinal lymph nodes are noted. No sizable hilar or mediastinal nodes are seen. The esophagus as visualized appears within normal limits. Lungs/Pleura: The lungs again demonstrate bilateral lower lobe and upper lobe infiltrates with increasing infiltrate in the lower lobes bilaterally. There is a partially solid nodule identified in the left upper lobe best seen on image number 87 of series 5. This has increased in size from the prior exam now measuring almost 10 mm. It previously measured 7 mm. Similar nodule is noted posteriorly in the left upper lobe on image number 69 of series 5 also increased in size when compared with the prior exam. Given their abrupt onset and increase these are felt to represent postinflammatory change. No sizable effusion is seen. No pneumothorax is noted. Postsurgical changes are again seen in the right middle lobe. Upper Abdomen: Visualized upper abdomen is within normal limits. Musculoskeletal: Degenerative changes of the thoracic spine are noted. IMPRESSION: Increasing bilateral consolidation in the lower lobes. Additionally some increase in nodular appearing areas are noted particularly in the left upper lobe consistent with progressive infectious pneumonia. Continued follow-up is recommended with appropriate therapy. Aortic Atherosclerosis (ICD10-I70.0). Electronically Signed   By: Inez Catalina M.D.   On: 09/30/2019 16:09   DG Chest Port 1 View  Result Date: 09/30/2019 CLINICAL DATA:  Shortness of breath EXAM: PORTABLE CHEST 1 VIEW COMPARISON:  09/20/2019 FINDINGS: Right chest wall port catheter tip overlies the superior right atrium. Bibasilar atelectasis. Mild interstitial prominence with hazy opacities, right  greater than left. No significant pleural effusion. Stable cardiomediastinal contours. IMPRESSION: Similar nonspecific interstitial prominence and hazy opacities. Electronically Signed   By: Macy Mis M.D.   On: 09/30/2019 13:52     Scheduled Meds: . docusate sodium  100 mg Oral BID  . ipratropium-albuterol  3 mL Nebulization QID  . levothyroxine  112 mcg Oral Daily  . methylPREDNISolone (SOLU-MEDROL) injection  40 mg Intravenous Q12H  . metoprolol succinate  50 mg Oral BID  . rivaroxaban  20 mg Oral Q supper  . rosuvastatin  10 mg Oral QPM   Continuous Infusions: . azithromycin    . cefTRIAXone (ROCEPHIN)  IV      Principal Problem:   Acute hypoxemic respiratory failure (HCC) Active Problems:   OSA on CPAP   Essential hypertension   Dyslipidemia, goal LDL below 130   Family history of coronary artery disease   Hx of malignant carcinoid tumor   Sepsis ([place])   Adenocarcinoma of right lung, stage 4 (HCC)   Pneumonitis   Pneumonitis, interstitial ([place])  Dewaine Oats Derek Jack, [facility]  If 7PM-7AM, please contact night-coverage [email or web address] Password [email or web address] 10/01/2019, 4:55 PM    LOS: 1 day

## 2019-10-01 NOTE — ED Notes (Signed)
Lani RN in [place] rm at time called rpt. Will return call when available.

## 2019-10-02 DIAGNOSIS — J8489 Other specified interstitial pulmonary diseases: Secondary | ICD-10-CM

## 2019-10-02 DIAGNOSIS — I700 Atherosclerosis of aorta: Secondary | ICD-10-CM

## 2019-10-02 DIAGNOSIS — N179 Acute kidney failure, unspecified: Secondary | ICD-10-CM

## 2019-10-02 DIAGNOSIS — J181 Lobar pneumonia, unspecified organism: Secondary | ICD-10-CM

## 2019-10-02 LAB — URINALYSIS, ROUTINE W REFLEX MICROSCOPIC
Bilirubin Urine: NEGATIVE
Glucose, UA: NEGATIVE mg/dL
Hgb urine dipstick: NEGATIVE
Ketones, ur: NEGATIVE mg/dL
Leukocytes,Ua: NEGATIVE
Nitrite: NEGATIVE
Protein, ur: NEGATIVE mg/dL
Specific Gravity, Urine: 1.013 (ref 1.005–1.030)
pH: 6 (ref 5.0–8.0)

## 2019-10-02 LAB — BASIC METABOLIC PANEL
Anion gap: 10 (ref 5–15)
BUN: 35 mg/dL — ABNORMAL HIGH (ref 8–23)
CO2: 29 mmol/L (ref 22–32)
Calcium: 8.7 mg/dL — ABNORMAL LOW (ref 8.9–10.3)
Chloride: 105 mmol/L (ref 98–111)
Creatinine, Ser: 1.28 mg/dL — ABNORMAL HIGH (ref 0.44–1.00)
GFR calc Af Amer: 47 mL/min — ABNORMAL LOW (ref >60)
GFR calc non Af Amer: 41 mL/min — ABNORMAL LOW (ref >60)
Glucose, Bld: 152 mg/dL — ABNORMAL HIGH (ref 70–99)
Potassium: 4.6 mmol/L (ref 3.5–5.1)
Sodium: 144 mmol/L (ref 135–145)

## 2019-10-02 LAB — CBC WITH DIFFERENTIAL/PLATELET
Abs Immature Granulocytes: 0.08 10*3/uL — ABNORMAL HIGH (ref 0.00–0.07)
Basophils Absolute: 0 10*3/uL (ref 0.0–0.1)
Basophils Relative: 0 %
Eosinophils Absolute: 0 10*3/uL (ref 0.0–0.5)
Eosinophils Relative: 0 %
HCT: 38.1 % (ref 36.0–46.0)
Hemoglobin: 11.5 g/dL — ABNORMAL LOW (ref 12.0–15.0)
Immature Granulocytes: 0 %
Lymphocytes Relative: 4 %
Lymphs Abs: 0.8 10*3/uL (ref 0.7–4.0)
MCH: 31.9 pg (ref 26.0–34.0)
MCHC: 30.2 g/dL (ref 30.0–36.0)
MCV: 105.5 fL — ABNORMAL HIGH (ref 80.0–100.0)
Monocytes Absolute: 0.7 10*3/uL (ref 0.1–1.0)
Monocytes Relative: 3 %
Neutro Abs: 18 10*3/uL — ABNORMAL HIGH (ref 1.7–7.7)
Neutrophils Relative %: 93 %
Platelets: 177 10*3/uL (ref 150–400)
RBC: 3.61 MIL/uL — ABNORMAL LOW (ref 3.87–5.11)
RDW: 16.6 % — ABNORMAL HIGH (ref 11.5–15.5)
WBC: 19.6 10*3/uL — ABNORMAL HIGH (ref 4.0–10.5)
nRBC: 0 % (ref 0.0–0.2)

## 2019-10-02 LAB — HEPARIN LEVEL (UNFRACTIONATED): Heparin Unfractionated: >2.2 IU/mL — ABNORMAL HIGH (ref 0.30–0.70)

## 2019-10-02 LAB — APTT: aPTT: 38 seconds — ABNORMAL HIGH (ref 24–36)

## 2019-10-02 LAB — PROCALCITONIN: Procalcitonin: 16.93 ng/mL

## 2019-10-02 LAB — STREP PNEUMONIAE URINARY ANTIGEN: Strep Pneumo Urinary Antigen: NEGATIVE

## 2019-10-02 MED ORDER — FLUTICASONE FUROATE-VILANTEROL 100-25 MCG/INH IN AEPB
1.0000 | INHALATION_SPRAY | Freq: Every day | RESPIRATORY_TRACT | Status: DC
  Filled 2019-10-02: qty 28

## 2019-10-02 MED ORDER — HYDRALAZINE HCL 20 MG/ML IJ SOLN
5.0000 mg | INTRAMUSCULAR | Status: DC | PRN
  Administered 2019-10-03: 5 mg via INTRAVENOUS
  Filled 2019-10-02 (×2): qty 1

## 2019-10-02 MED ORDER — BUDESON-GLYCOPYRROL-FORMOTEROL 160-9-4.8 MCG/ACT IN AERO
2.0000 | INHALATION_SPRAY | Freq: Two times a day (BID) | RESPIRATORY_TRACT | Status: DC
  Administered 2019-10-02 – 2019-10-12 (×16): 2 via ORAL

## 2019-10-02 MED ORDER — UMECLIDINIUM BROMIDE 62.5 MCG/INH IN AEPB
1.0000 | INHALATION_SPRAY | Freq: Every day | RESPIRATORY_TRACT | Status: DC
  Filled 2019-10-02: qty 7

## 2019-10-02 MED ORDER — HEPARIN (PORCINE) 25000 UT/250ML-% IV SOLN
1300.0000 [IU]/h | INTRAVENOUS | Status: AC
  Administered 2019-10-02: 1050 [IU]/h via INTRAVENOUS
  Administered 2019-10-03: 1150 [IU]/h via INTRAVENOUS
  Filled 2019-10-02 (×2): qty 250

## 2019-10-02 NOTE — Progress Notes (Signed)
PROGRESS NOTE  Erin Myers VQM:[id number] DOB: 10-Mar-1941 DOA: 09/30/2019 PCP: [name], MD  Brief History   79 year old woman PMH including lung cancer and chemotherapy, interstitial lung disease, COPD, chronic hypoxic respiratory failure on 4 L presented with shortness of breath.  Admitted for acute on chronic hypoxic respiratory failure secondary to pneumonia superimposed on interstitial lung disease and lung cancer.  A & P  Acute on chronic hypoxic resp failure (on 4L [place] at home), secondary to multilobar pneumonia, superimposed on ILD and lung cancer --continue abx --continue steroids, oxygen at 5L and wean to 4L as tolerated, continue management per pulmonology --tentative Bronch 7/23  AKI --improving, check BMP in a.m.  Lung cancer  --will notify Dr. [name] of admission  PMH VTE --heparin gtt then back to Xarelto  Hypothyroidism --continue levothyroxine  Aortic atherosclerosis --continue Crestor  Disposition Plan:  Discussion: Seems to be stabilizing.  Continue empiric antibiotics, supplemental oxygen, steroids.  No opportunity for discharge prior to Friday with bronchoscopy planned.  Need to closely monitor oxygen status.  Status is: Inpatient  Remains inpatient appropriate because:Inpatient level of care appropriate due to severity of illness   Dispo: The patient is from: Home              Anticipated d/c is to: Home              Anticipated d/c date is: 2 days              Patient currently is not medically stable to d/c.  DVT prophylaxis: heparin infusion peri-procedure (bronch), then back to Xarelto  Code Status: Full Family Communication: none present or requested, pt appears to understand plan  Murray Hodgkins, MD  [facility] Direct contact: see [email or web address] (further directions at bottom of note if needed) 7PM-7AM contact night coverage as at bottom of note 10/02/2019, 10:33 AM  LOS: 2 days    Significant Hospital Events   7/19  admit  Consults:  PCCM  Procedures:    Significant Diagnostic Tests:  CT Chest 09/30/2019-increasing bilateral lower lobe consolidation with nodular appearing areas in the upper lobe.  Micro Data:  Blood culture 7/19 >>   Antimicrobials:  Ceftriaxone 7/19 >> Azithromycin 7/19 >>   Interval History/Subjective  Feels ok, slept ok, eating ok; breathing better than admission but no change from yesterday.  Objective   Vitals:  Vitals:   10/02/19 0719 10/02/19 0800  BP:  (!) 168/55  Pulse:  82  Resp:  15  Temp:  98.2 F (36.8 C)  SpO2: 95% (!) 88%    Exam:  Constitutional:   . Appears calm and comfortable on 5L ENMT:  . grossly normal hearing  Respiratory:  . Coarse breath sounds, no frank w/r/r. Fair air movement . Respiratory effort mildly increased. Cardiovascular:  . Distant, obscured by lung sounds Abdomen:  . Soft, ntnd Musculoskeletal:  . Digits/nails BUE: no clubbing, cyanosis, petechiae, infection Skin:  . No rashes, lesions, ulcers noted Psychiatric:  . Mental status o Mood, affect appropriate . judgment and insight appear intact   I have personally reviewed the following:   Today's Data  . Creatinine down to 1.28 . procalcitonin down to 16.93 . Hgb stable, WBC at 19.6 but on steroids  Scheduled Meds: . Chlorhexidine Gluconate Cloth  6 each Topical Daily  . clorazepate  3.75 mg Oral BID  . docusate sodium  100 mg Oral BID  . fluticasone furoate-vilanterol  1 puff Inhalation Daily   And  . umeclidinium  bromide  1 puff Inhalation Daily  . ipratropium-albuterol  3 mL Nebulization QID  . levothyroxine  112 mcg Oral Daily  . mouth rinse  15 mL Mouth Rinse BID  . methylPREDNISolone (SOLU-MEDROL) injection  40 mg Intravenous Q12H  . metoprolol succinate  50 mg Oral BID  . rosuvastatin  10 mg Oral QPM  . sodium chloride flush  10-40 mL Intracatheter Q12H  . valACYclovir  500 mg Oral Daily   Continuous Infusions: . azithromycin Stopped  (10/01/19 1822)  . cefTRIAXone (ROCEPHIN)  IV Stopped (10/01/19 1856)  . heparin      Principal Problem:   Acute on chronic respiratory failure with hypoxia (HCC) Active Problems:   OSA on CPAP   Essential hypertension   Dyslipidemia, goal LDL below 130   Family history of coronary artery disease   Hx of malignant carcinoid tumor   Sepsis ([place])   Adenocarcinoma of right lung, stage 4 (HCC)   Pneumonitis   Pneumonitis, interstitial (HCC)   AKI (acute kidney injury) ([place])   Lobar pneumonia ([place])   Aortic atherosclerosis ([place])   LOS: 2 days   How to contact the [facility] Attending or Consulting provider [place] or covering provider during after hours [place], for this patient?  1. Check the care team in [facility] and look for a) attending/consulting [facility] provider listed and b) the [facility] team listed 2. Log into [email or web address] and use [place]'s universal password to access. If you do not have the password, please contact the hospital operator. 3. Locate the [facility] provider you are looking for under [facility] and page to a number that you can be directly reached. 4. If you still have difficulty reaching the provider, please page the [facility] (Director on Call) for the Hospitalists listed on amion for assistance.

## 2019-10-02 NOTE — Progress Notes (Signed)
ANTICOAGULATION CONSULT NOTE - Initial Consult  Pharmacy Consult for heparin Indication: hx VTE on xarelto   Allergies  Allergen Reactions   Pembrolizumab Anaphylaxis   Adhesive [Tape] Other (See Comments)    Irritates skin    Codeine Other (See Comments)    Skin crawlinf feeling   Lactose Intolerance (Gi) Diarrhea   Monosodium Glutamate Diarrhea   Sulfa Antibiotics Nausea And Vomiting    Patient Measurements: Height: 5\' 5"  (165.1 cm) Weight: 76.2 kg (168 lb) IBW/kg (Calculated) : 57 Heparin wt: 72.7 kg  Vital Signs: Temp: 98.2 F (36.8 C) (07/21 0800) Temp Source: Oral (07/21 0800) BP: 168/55 (07/21 0800) Pulse Rate: 82 (07/21 0800)  Labs: Recent Labs    09/30/19 1325 09/30/19 1325 09/30/19 1607 10/01/19 0450 10/02/19 0548  HGB 13.2   < >  --  12.0 11.5*  HCT 43.1  --   --  39.6 38.1  PLT 217  --   --  170 177  CREATININE 1.89*  --   --  1.55* 1.28*  TROPONINIHS 31*  --  24*  --   --    < > = values in this interval not displayed.    Estimated Creatinine Clearance: 38.2 mL/min (A) (by C-G formula based on SCr of 1.28 mg/dL (H)).   Medical History: Past Medical History:  Diagnosis Date   Anxiety    Diabetes mellitus without complication ([place])    Dyslipidemia    HTN (hypertension)    Hyperlipemia    Hypothyroid    Hypothyroidism    Lung cancer ([place]) 2000   Carcinoid, s/p RLL lobectomy   Obesity    Ovarian cancer ([place])    Palpitations 2009 and 2006   Negative nuclear stress test    Sleep apnea    on C-pap    Medications: Rivaroxaban 20 mg PO daily with supper PTA   Assessment: Pt is a 71 yoF presenting with worsening SOB. PMH significant for interstitial pneumonitis, lung cancer currently on chemotherapy, history of VTE currently prescribed rivaroxaban 20 mg PO daily PTA. Pt noted to have some hemoptysis on admission, pharmacy consulted to transition rivaroxaban to heparin 7/21 in preparation for bronchoscopy Friday 7/23..    Today, 10/02/19  CBC: Hgb 11.5 down from 13.2 on admit. PLT stable at 177  SCr down to 1.28 from 1.89  Last dose of rivaroxaban 7/20 at 1823  Pink tinged sputum every time she coughs  Baseline heparin level > 2.2 due to rivaroxaban  Baseline aPTT 38 seconds   Plan:  Start heparin tonight at 1800 at 1050 units/hr and check 8 hr aPTT at 02 am  Daily aPTT/heparin level and CBC For bronchoscopy 7/23  Eudelia Bunch, Pharm.D 10/02/2019 8:43 AM

## 2019-10-02 NOTE — Progress Notes (Signed)
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  PCCM consulted for help with management  History of present illness   Lung cancer diagnosed in May 2019, treated with systemic chemotherapy and [place], Keytruda. She developed interstitial pneumonitis secondary to [facility] earlier this year treated with prednisone and Keytruda therapy was on hold. She has received several rounds of prednisone recently for immunotherapy related pneumonitis  Past Medical History    has a past medical history of Anxiety, Diabetes mellitus without complication ([place]), Dyslipidemia, HTN (hypertension), Hyperlipemia, Hypothyroid, Hypothyroidism, Lung cancer ([place]) (2000), Obesity, Ovarian cancer ([place]), Palpitations (2009 and 2006), and Sleep apnea.  Significant Hospital Events   7/19 admit  Consults:  PCCM  Procedures:    Significant Diagnostic Tests:  CT Chest 09/30/2019-increasing bilateral lower lobe consolidation with nodular appearing areas in the upper lobe.  Micro Data:  Blood culture 7/19 >>   Antimicrobials:  Ceftriaxone 7/19 >> Azithromycin 7/19 >>   Interim history/subjective:  7/21: weaned to 5L HFNC. Reports continued pink tinged sputum every time she coughs. Reports that breathing has improved.   Objective   Blood pressure (!) 153/44, pulse 73, temperature 97.8 F (36.6 C), temperature source Oral, resp. rate (!) 21, height 5\' 5"  (1.651 m), weight 76.2 kg, SpO2 95 %.        Intake/Output Summary (Last 24 hours) at 10/02/2019 2836 Last data filed at 10/02/2019 0515 Gross per 24 hour  Intake 465.23 ml  Output 600 ml   Net -134.77 ml   Filed Weights   09/30/19 1226  Weight: 76.2 kg    Examination: Gen:      Sitting in Bed, no distress noted  HEENT:  Dry MM Lungs:    Scattered rhonchi, no tachypnea noted  CV:         RRR Ext:    No edema; adequate peripheral perfusion Skin:      Warm and dry; no rash Neuro: alert and oriented, follows commands   Resolved Hospital Problem list     Assessment & Plan:   Acute on Chronic hypoxic respiratory failure with worsening bilateral lung infiltrates -At baseline on 4L [place] with Saturations 90-93% per patient  History of stage IV lung cancer Recurrent immunotherapy related pneumonitis Differential diagnosis at this point includes pneumonia versus immunotherapy related pneumonitis or progression of lung cancer -Procal 16.93 (30.97)  Plan -Titrate Supplemental Oxygen for Saturation Goal > 90 -Continue Solu-Medrol (Currently on 40 mg BID) > Will Plan for Slow Taper   -Follow culture data, Have Reordered - urine pneumococcus and Legionella test (still needing to be collected) -provide flutter, advised to notify should sputum change from pink tinged to bloody -Have spoke with pulmonologist. Plan for Bronchoscopy Friday >> have spoken with patient about this and that we could possible hold if her clinically status worsens.   H/O Pulmonary embolism Plan -Will transition to Heparin gtt for Bronchoscopy plans, and hold Xarelto as this time.    Best practice:  Diet: PO DVT prophylaxis: on home a/c Glucose control: Monitor Mobility: Bed Code Status: Full Family Communication: Patient updated Disposition: intermediate care unit.   Labs   CBC: Recent Labs  Lab 09/30/19 1325 10/01/19 0450 10/02/19 6294  WBC 29.6* 17.0* 19.6*  NEUTROABS  --   --  18.0*  HGB 13.2 12.0 11.5*  HCT 43.1 39.6 38.1  MCV 103.9* 104.8* 105.5*  PLT 217 170 211    Basic Metabolic Panel: Recent Labs  Lab 09/30/19 1325 10/01/19 0450 10/02/19 0548  NA 140 143 144  K 5.2*  5.0 4.6  CL 97* 104 105  CO2 28 29 29   GLUCOSE 124* 152* 152*  BUN 44* 40* 35*  CREATININE 1.89* 1.55* 1.28*  CALCIUM 8.9 8.9 8.7*   GFR: Estimated Creatinine Clearance: 38.2 mL/min (A) (by C-G formula based on SCr of 1.28 mg/dL (H)). Recent Labs  Lab 09/30/19 1325 10/01/19 0450 10/02/19 0548  PROCALCITON  --  30.97 16.93  WBC 29.6* 17.0* 19.6*    Liver Function Tests: Recent Labs  Lab 09/30/19 1325 10/01/19 0450  AST 18 17  ALT 21 18  ALKPHOS 53 51  BILITOT 0.6 0.7  PROT 5.8* 5.9*  ALBUMIN 3.0* 2.8*   No results for input(s): LIPASE, AMYLASE in the last 168 hours. No results for input(s): AMMONIA in the last 168 hours.  ABG No results found for: PHART, PCO2ART, PO2ART, HCO3, TCO2, ACIDBASEDEF, O2SAT   Coagulation Profile: No results for input(s): INR, PROTIME in the last 168 hours.  Cardiac Enzymes: No results for input(s): CKTOTAL, CKMB, CKMBINDEX, TROPONINI in the last 168 hours.  HbA1C: Hgb A1c MFr Bld  Date/Time Value Ref Range Status  07/19/2017 06:16 AM 6.2 (H) 4.8 - 5.6 % Final    Comment:    (NOTE) Pre diabetes:          5.7%-6.4% Diabetes:              >6.4% Glycemic control for   <7.0% adults with diabetes     CBG: No results for input(s): GLUCAP in the last 168 hours.  Erin Myers, AGACNP-BC [facility]  Pgr: [phone]  PCCM Pgr: [phone]

## 2019-10-02 NOTE — TOC Initial Note (Signed)
Transition of Care [facility]) - Initial/Assessment Note    Patient Details  Name: Erin Myers MRN: [id number] Date of Birth: 07/11/1940  [facility] CM/SW Contact:    Leeroy Cha, RN Phone Number: 10/02/2019, 7:40 AM  Clinical Narrative:                 Pt with bilateral lower lobe pneumonia [place] from home has pcp will return home.  Expected Discharge Plan: Home/Self Care Barriers to Discharge: Continued Medical Work up   Patient Goals and CMS Choice Patient states their goals for this hospitalization and ongoing recovery are:: to go home CMS Medicare.gov Compare Post Acute Care list provided to:: Patient    Expected Discharge Plan and Services Expected Discharge Plan: Home/Self Care   Discharge Planning Services: CM Consult   Living arrangements for the past 2 months: Single Family Home                                      Prior Living Arrangements/Services Living arrangements for the past 2 months: Single Family Home Lives with:: Spouse Patient language and need for interpreter reviewed:: Yes Do you feel safe going back to the place where you live?: Yes      Need for Family Participation in Patient Care: Yes (Comment) Care giver support system in place?: Yes (comment)   Criminal Activity/Legal Involvement Pertinent to Current Situation/Hospitalization: No - Comment as needed  Activities of Daily Living Home Assistive Devices/Equipment: CPAP, Eyeglasses, Oxygen ADL Screening (condition at time of admission) Patient's cognitive ability adequate to safely complete daily activities?: Yes Is the patient deaf or have difficulty hearing?: No Does the patient have difficulty seeing, even when wearing glasses/contacts?: No Does the patient have difficulty concentrating, remembering, or making decisions?: No Patient able to express need for assistance with ADLs?: Yes Does the patient have difficulty dressing or bathing?: No Independently  performs ADLs?: Yes (appropriate for developmental age) Does the patient have difficulty walking or climbing stairs?: Yes (secondary to shortness of breath) Weakness of Legs: None Weakness of Arms/Hands: None  Permission Sought/Granted                  Emotional Assessment Appearance:: Appears stated age Attitude/Demeanor/Rapport: Engaged Affect (typically observed): Calm Orientation: : Oriented to Place, Oriented to Self, Oriented to  Time, Oriented to Situation Alcohol / Substance Use: Not Applicable Psych Involvement: No (comment)  Admission diagnosis:  Acute respiratory failure with hypoxia ([place]) [J96.01] Acute hypoxemic respiratory failure ([place]) [J96.01] Patient Active Problem List   Diagnosis Date Noted  . Acute hypoxemic respiratory failure ([place]) 09/30/2019  . Pneumonitis, interstitial ([place]) 09/19/2019  . Pneumonitis 07/09/2019  . Chronic hypoxemic respiratory failure ([place]) 05/30/2019  . Recurrent right pleural effusion 06/28/2018  . Antineoplastic chemotherapy induced anemia 05/16/2018  . Bronchiectasis ([place]) 03/12/2018  . Cough 11/10/2017  . Bilateral pulmonary embolism ([place]) 10/19/2017  . Port-A-Cath in place 08/18/2017  . Encounter for antineoplastic immunotherapy 08/10/2017  . Encounter for antineoplastic chemotherapy 08/10/2017  . Adenocarcinoma of right lung, stage 4 ([place]) 07/31/2017  . Goals of care, counseling/discussion 07/31/2017  . Sinusitis 07/19/2017  . Acute on chronic respiratory failure with hypoxia ([place]) 07/19/2017  . Hypokalemia 07/19/2017  . Sepsis ([place]) 07/18/2017  . Abnormal CT of the chest 07/14/2017  . Hypoxemia 07/03/2017  . Community acquired pneumonia of right upper lobe of lung 05/30/2017  . Dyspnea 05/02/2017  .  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 History of posttraumatic stress disorder (PTSD) 05/14/2015  . Obesity (BMI 30-39.9) 08/20/2012  . Family history of coronary artery disease 08/17/2012  . Palpitations   . OSA  on CPAP   . Essential hypertension   . Dyslipidemia, goal LDL below 130   . Hypothyroid    PCP:  [name], MD Pharmacy:   [facility] [place] [address] Phone: [phone] Fax: [phone]     Social Determinants of Health (SDOH) Interventions    Readmission Risk Interventions No flowsheet data found.

## 2019-10-03 LAB — BASIC METABOLIC PANEL
Anion gap: 9 (ref 5–15)
BUN: 37 mg/dL — ABNORMAL HIGH (ref 8–23)
CO2: 29 mmol/L (ref 22–32)
Calcium: 8.5 mg/dL — ABNORMAL LOW (ref 8.9–10.3)
Chloride: 106 mmol/L (ref 98–111)
Creatinine, Ser: 1.12 mg/dL — ABNORMAL HIGH (ref 0.44–1.00)
GFR calc Af Amer: 55 mL/min — ABNORMAL LOW (ref >60)
GFR calc non Af Amer: 48 mL/min — ABNORMAL LOW (ref >60)
Glucose, Bld: 158 mg/dL — ABNORMAL HIGH (ref 70–99)
Potassium: 4.5 mmol/L (ref 3.5–5.1)
Sodium: 144 mmol/L (ref 135–145)

## 2019-10-03 LAB — CBC
HCT: 37.4 % (ref 36.0–46.0)
Hemoglobin: 11.3 g/dL — ABNORMAL LOW (ref 12.0–15.0)
MCH: 31.7 pg (ref 26.0–34.0)
MCHC: 30.2 g/dL (ref 30.0–36.0)
MCV: 104.8 fL — ABNORMAL HIGH (ref 80.0–100.0)
Platelets: 163 10*3/uL (ref 150–400)
RBC: 3.57 MIL/uL — ABNORMAL LOW (ref 3.87–5.11)
RDW: 16 % — ABNORMAL HIGH (ref 11.5–15.5)
WBC: 16.1 10*3/uL — ABNORMAL HIGH (ref 4.0–10.5)
nRBC: 0 % (ref 0.0–0.2)

## 2019-10-03 LAB — HEPARIN LEVEL (UNFRACTIONATED): Heparin Unfractionated: 0.69 IU/mL (ref 0.30–0.70)

## 2019-10-03 LAB — APTT
aPTT: >200 seconds (ref 24–36)
aPTT: 63 seconds — ABNORMAL HIGH (ref 24–36)
aPTT: 64 seconds — ABNORMAL HIGH (ref 24–36)

## 2019-10-03 LAB — PHOSPHORUS: Phosphorus: 3.9 mg/dL (ref 2.5–4.6)

## 2019-10-03 LAB — MAGNESIUM: Magnesium: 2.1 mg/dL (ref 1.7–2.4)

## 2019-10-03 MED ORDER — HYDRALAZINE HCL 20 MG/ML IJ SOLN: 10.0000 mg | INTRAMUSCULAR | Status: DC | PRN

## 2019-10-03 MED ORDER — IPRATROPIUM-ALBUTEROL 0.5-2.5 (3) MG/3ML IN SOLN
3.0000 mL | Freq: Three times a day (TID) | RESPIRATORY_TRACT | Status: DC
  Administered 2019-10-03 – 2019-10-12 (×27): 3 mL via RESPIRATORY_TRACT
  Filled 2019-10-03 (×27): qty 3

## 2019-10-03 NOTE — Progress Notes (Signed)
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 Pembrolizumab Anaphylaxis  . Adhesive [Tape] Other (See Comments)    Irritates skin   . Codeine Other (See Comments)    Skin crawlinf feeling  . Lactose Intolerance (Gi) Diarrhea  . Monosodium Glutamate Diarrhea  . Sulfa Antibiotics Nausea And Vomiting    Patient Measurements: Height: 5\' 5"  (165.1 cm) Weight: 76.2 kg (168 lb) IBW/kg (Calculated) : 57 Heparin wt: 72.7 kg  Vital Signs: Temp: 98 F (36.7 C) (07/22 0423) Temp Source: Oral (07/22 0423) BP: 190/69 (07/22 0500) Pulse Rate: 63 (07/22 0500)  Labs: Recent Labs    09/30/19 1325 09/30/19 1325 09/30/19 1607 10/01/19 0450 10/01/19 0450 10/02/19 0548 10/02/19 0846 10/02/19 0847 10/03/19 0332 10/03/19 0520  HGB 13.2   < >  --  12.0   < > 11.5*  --   --  11.3*  --   HCT 43.1   < >  --  39.6  --  38.1  --   --  37.4  --   PLT 217   < >  --  170  --  177  --   --  163  --   APTT  --   --   --   --   --   --   --  38* >200* 63*  HEPARINUNFRC  --   --   --   --   --   --  >2.20*  --   --  0.69  CREATININE 1.89*   < >  --  1.55*  --  1.28*  --   --  1.12*  --   TROPONINIHS 31*  --  24*  --   --   --   --   --   --   --    < > = values in this interval not displayed.    Estimated Creatinine Clearance: 43.6 mL/min (A) (by C-G formula based on SCr of 1.12 mg/dL (H)).   Medical History: Past Medical History:  Diagnosis Date  . Anxiety   . Diabetes mellitus without complication ([place])   . Dyslipidemia   . HTN (hypertension)   . Hyperlipemia   . Hypothyroid   . Hypothyroidism   . Lung cancer ([place]) 2000   Carcinoid, s/p RLL lobectomy  . Obesity   . Ovarian cancer ([place])   . Palpitations 2009 and 2006   Negative nuclear stress test   . Sleep apnea    on C-pap    Medications: Rivaroxaban 20 mg PO daily with supper PTA.  Last dose 7/20 @ 1823.  Assessment: Pt is a 8 yoF presenting  with worsening SOB. PMH significant for interstitial pneumonitis, lung cancer currently on chemotherapy, history of VTE currently prescribed rivaroxaban 20 mg PO daily PTA. Pt noted to have some hemoptysis on admission, pharmacy consulted to transition rivaroxaban to heparin 7/21 in preparation for bronchoscopy Friday 7/23.[name]   Baseline labs:CBC: Hgb 11.5 down from 13.2 on admit. PLT stable at 177; heparin level > 2.2 due to rivaroxaban; aPTT 38 seconds  10/03/2019: -aPTT >200sec drawn from central line where heparin infusing.  Repeat level drawn peripherally low at 63 sec. -Heparin level 0.69- trending down but still falsely elevated due to DOAC interaction - CBC: stable - Noted to have blood-tinged sputum when coughs- stable.  No other bleeding or infusion related concerns per nursing.     Plan:  -  Increase heparin to 1150 units/hr  -Check 8 hr aPTT after rate change - Daily aPTT/heparin level and CBC - For bronchoscopy 7/23  Netta Cedars, PharmD, BCPS 10/03/2019 6:10 AM

## 2019-10-03 NOTE — Care Plan (Signed)
Plans for Bronchoscopy at 1:30 7/23. NPO at [place] and hold heparin gtt at [place]. Orders have been placed.

## 2019-10-03 NOTE — Progress Notes (Signed)
HEMATOLOGY-ONCOLOGY PROGRESS NOTE  SUBJECTIVE: The patient reports that she is feeling better today.  Breathing has improved.  She is on O2 at 4 L/min which is her baseline.  No recurrent hemoptysis today.  Still with cough.  Oncology History  Adenocarcinoma of right lung, stage 4 ([place])  07/31/2017 Initial Diagnosis   Adenocarcinoma of right lung, stage 4 ([place])   08/18/2017 -  Chemotherapy   The patient had palonosetron (ALOXI) injection 0.25 mg, 0.25 mg, Intravenous,  Once, 4 of 4 cycles Administration: 0.25 mg (08/18/2017), 0.25 mg (09/07/2017), 0.25 mg (09/28/2017), 0.25 mg (10/19/2017) PEMEtrexed (ALIMTA) 1,000 mg in sodium chloride 0.9 % 100 mL chemo infusion, 485 mg/m2 = 1,025 mg, Intravenous,  Once, 15 of 15 cycles Administration: 1,000 mg (08/18/2017), 1,000 mg (09/07/2017), 1,000 mg (09/28/2017), 1,000 mg (10/19/2017), 1,000 mg (11/09/2017), 1,000 mg (11/30/2017), 1,000 mg (12/19/2017), 1,000 mg (01/11/2018), 1,000 mg (02/01/2018), 1,000 mg (02/22/2018), 1,000 mg (03/15/2018), 1,000 mg (04/05/2018), 1,000 mg (04/26/2018), 1,000 mg (05/16/2018), 1,000 mg (06/07/2018) CARBOplatin (PARAPLATIN) 490 mg in sodium chloride 0.9 % 250 mL chemo infusion, 490 mg (100 % of original dose 489.5 mg), Intravenous,  Once, 4 of 4 cycles Dose modification: 489.5 mg (original dose 489.5 mg, Cycle 1), 489.5 mg (original dose 489.5 mg, Cycle 2), 489.5 mg (original dose 489.5 mg, Cycle 3), 489.5 mg (original dose 489.5 mg, Cycle 4) Administration: 490 mg (08/18/2017), 490 mg (09/07/2017), 490 mg (09/28/2017), 490 mg (10/19/2017) pembrolizumab (KEYTRUDA) 200 mg in sodium chloride 0.9 % 50 mL chemo infusion, 200 mg, Intravenous, Once, 23 of 28 cycles Administration: 200 mg (08/18/2017), 200 mg (11/09/2017), 200 mg (09/07/2017), 200 mg (11/30/2017), 200 mg (09/28/2017), 200 mg (10/19/2017), 200 mg (12/19/2017), 200 mg (01/11/2018), 200 mg (02/01/2018), 200 mg (02/22/2018), 200 mg (03/15/2018), 200 mg (04/05/2018), 200 mg (04/26/2018), 200 mg (05/16/2018), 200  mg (06/07/2018), 200 mg (02/12/2019), 200 mg (03/05/2019), 200 mg (03/26/2019), 200 mg (04/16/2019), 200 mg (05/07/2019), 200 mg (05/28/2019), 200 mg (06/17/2019), 200 mg (08/28/2019) fosaprepitant (EMEND) 150 mg, dexamethasone (DECADRON) 12 mg in sodium chloride 0.9 % 145 mL IVPB, , Intravenous,  Once, 4 of 4 cycles Administration:  (08/18/2017),  (09/07/2017),  (09/28/2017),  (10/19/2017)  for chemotherapy treatment.       REVIEW OF SYSTEMS:   Constitutional: Denies fevers currently but reports having a low-grade fever on admission and chills at home prior to admission Respiratory: Reports cough but no hemoptysis.  Still has shortness of breath but consistent with baseline. Cardiovascular: Denies palpitation, chest discomfort Gastrointestinal:  Denies nausea, heartburn or change in bowel habits Skin: Denies abnormal skin rashes Lymphatics: Denies new lymphadenopathy or easy bruising Neurological:Denies numbness, tingling or new weaknesses Behavioral/Psych: Mood is stable, no new changes  Extremities: No lower extremity edema All other systems were reviewed with the patient and are negative.  I have reviewed the past medical history, past surgical history, social history and family history with the patient and they are unchanged from previous note.   PHYSICAL EXAMINATION: ECOG PERFORMANCE STATUS: 1 - Symptomatic but completely ambulatory  Vitals:   10/03/19 1500 10/03/19 1600  BP: (!) 145/38 (!) 163/49  Pulse: 76 71  Resp: 16 13  Temp:    SpO2: 93% 94%   Filed Weights   09/30/19 1226  Weight: 76.2 kg    Intake/Output from previous day: 07/21 0701 - 07/22 0700 In: 459.6 [I.V.:109.6; IV Piggyback:350] Out: 1850 [Urine:1850]  GENERAL:alert, no distress and comfortable, sitting up in the recliner SKIN: skin color, texture, turgor are normal, no  rashes or significant lesions EYES: normal, Conjunctiva are pink and non-injected, sclera clear LUNGS: Coarse breath sounds bilaterally HEART:  regular rate & rhythm and no murmurs and no lower extremity edema ABDOMEN:abdomen soft, non-tender and normal bowel sounds NEURO: alert & oriented x 3 with fluent speech, no focal motor/sensory deficits  LABORATORY DATA:  I have reviewed the data as listed CMP Latest Ref Rng & Units 10/03/2019 10/02/2019 10/01/2019  Glucose 70 - 99 mg/dL 158(H) 152(H) 152(H)  BUN 8 - 23 mg/dL 37(H) 35(H) 40(H)  Creatinine 0.44 - 1.00 mg/dL 1.12(H) 1.28(H) 1.55(H)  Sodium 135 - 145 mmol/L 144 144 143  Potassium 3.5 - 5.1 mmol/L 4.5 4.6 5.0  Chloride 98 - 111 mmol/L 106 105 104  CO2 22 - 32 mmol/L 29 29 29   Calcium 8.9 - 10.3 mg/dL 8.5(L) 8.7(L) 8.9  Total Protein 6.5 - 8.1 g/dL - - 5.9(L)  Total Bilirubin 0.3 - 1.2 mg/dL - - 0.7  Alkaline Phos 38 - 126 U/L - - 51  AST 15 - 41 U/L - - 17  ALT 0 - 44 U/L - - 18    Lab Results  Component Value Date   WBC 16.1 (H) 10/03/2019   HGB 11.3 (L) 10/03/2019   HCT 37.4 10/03/2019   MCV 104.8 (H) 10/03/2019   PLT 163 10/03/2019   NEUTROABS 18.0 (H) 10/02/2019    DG Chest 2 View  Result Date: 09/20/2019 CLINICAL DATA:  Cough and pneumonia. EXAM: CHEST - 2 VIEW COMPARISON:  July 04, 2018 FINDINGS: The mediastinal contour and cardiac silhouette are stable. Increased pulmonary interstitium with ground-glass opacity is identified throughout the right lung and left mid lung and left lung base. Minimal left pleural effusion is identified. Bony structures are stable. IMPRESSION: Increased pulmonary interstitium with ground-glass opacity is identified throughout the right lung and left mid lung and left lung base. Favor pneumonia. Minimal left pleural effusion. Electronically Signed   By: Abelardo Diesel M.D.   On: 09/20/2019 11:16   CT Chest Wo Contrast  Result Date: 09/30/2019 CLINICAL DATA:  Increasing shortness of breath EXAM: CT CHEST WITHOUT CONTRAST TECHNIQUE: Multidetector CT imaging of the chest was performed following the standard protocol without IV contrast.  COMPARISON:  Chest x-ray from earlier in the same day, CT from 08/16/2019 FINDINGS: Cardiovascular: Somewhat limited due to lack of IV contrast. Aortic calcifications are noted. No aneurysmal dilatation is seen. No cardiac enlargement is noted. No pericardial effusion is seen. Right chest wall port is noted. Mediastinum/Nodes: Thoracic inlet is within normal limits. Scattered tiny mediastinal lymph nodes are noted. No sizable hilar or mediastinal nodes are seen. The esophagus as visualized appears within normal limits. Lungs/Pleura: The lungs again demonstrate bilateral lower lobe and upper lobe infiltrates with increasing infiltrate in the lower lobes bilaterally. There is a partially solid nodule identified in the left upper lobe best seen on image number 87 of series 5. This has increased in size from the prior exam now measuring almost 10 mm. It previously measured 7 mm. Similar nodule is noted posteriorly in the left upper lobe on image number 69 of series 5 also increased in size when compared with the prior exam. Given their abrupt onset and increase these are felt to represent postinflammatory change. No sizable effusion is seen. No pneumothorax is noted. Postsurgical changes are again seen in the right middle lobe. Upper Abdomen: Visualized upper abdomen is within normal limits. Musculoskeletal: Degenerative changes of the thoracic spine are noted. IMPRESSION: Increasing bilateral consolidation  in the lower lobes. Additionally some increase in nodular appearing areas are noted particularly in the left upper lobe consistent with progressive infectious pneumonia. Continued follow-up is recommended with appropriate therapy. Aortic Atherosclerosis (ICD10-I70.0). Electronically Signed   By: Inez Catalina M.D.   On: 09/30/2019 16:09   DG Chest Port 1 View  Result Date: 09/30/2019 CLINICAL DATA:  Shortness of breath EXAM: PORTABLE CHEST 1 VIEW COMPARISON:  09/20/2019 FINDINGS: Right chest wall port catheter tip  overlies the superior right atrium. Bibasilar atelectasis. Mild interstitial prominence with hazy opacities, right greater than left. No significant pleural effusion. Stable cardiomediastinal contours. IMPRESSION: Similar nonspecific interstitial prominence and hazy opacities. Electronically Signed   By: Macy Mis M.D.   On: 09/30/2019 13:52    ASSESSMENT AND PLAN: This is a very pleasant 79 year old white female with stage IV non-small cell lung cancer, adenocarcinoma with no actionable mutations.  She received systemic chemotherapy with carboplatin, Alimta, and Keytruda status post 15 cycles.  Starting from cycle #5, she received maintenance treatment with Alimta and Keytruda.  She was then placed on observation for several months, but imaging study showed evidence for disease progression.  She then received treatment with single agent Keytruda.  This was restarted on 02/12/2019.  She was off treatment for about 6 weeks for suspicious immune mediated pneumonitis and received a tapered dose of prednisone and felt much better.  It was recommended for her to resume treatment with Keytruda (cycle #23) and this was resumed on 08/28/2019.    MyChart message from patient on 6/21 indicated that she developed worsening shortness of breath and cough and she was placed on a Medrol Dosepak.  She had some improvement in her symptoms but still with cough and additional steroids were sent to her pharmacy on 09/10/2019.  We received a MyChart message from the patient on 09/17/2019 with ongoing cough despite taking steroids.  It was recommended that she follow-up with pulmonology.  She had a telemedicine visit with them on 09/19/2019 and it was recommended for her to continue a long prednisone taper and to obtain chest x-ray.  The patient continued to have shortness of breath and developed hypoxia.  She was sent to the emergency room for further evaluation.  She initially required 15 L high flow oxygen and was placed on  antibiotics.  She is also receiving IV steroids.  CT chest did show progressive infectious pneumonia.  The patient is feeling much better now.  She has been seen by pulmonology who plans for bronchoscopy on 10/04/2019.  Discussed with the patient that we will await bronchoscopy results.  We discussed that her worsening respiratory status could be related to infection versus progression of her cancer versus immune mediated pneumonitis.  We will have further discussions with the patient once bronchoscopy complete.    LOS: 3 days   Mikey Bussing, DNP, AGPCNP-BC, AOCNP 10/03/19

## 2019-10-03 NOTE — Progress Notes (Signed)
PROGRESS NOTE  Erin Myers PFX:[id number] DOB: 10-Aug-1940 DOA: 09/30/2019 PCP: Erin Battles, MD  Brief History   79 year old woman PMH including lung cancer and chemotherapy, interstitial lung disease, COPD, chronic hypoxic respiratory failure on 4 L presented with shortness of breath.  Admitted for acute on chronic hypoxic respiratory failure secondary to pneumonia superimposed on interstitial lung disease and lung cancer.  A & P  Acute on chronic hypoxic resp failure (on 4L [place] at home), secondary to multilobar pneumonia, superimposed on ILD and lung cancer --Respiratory status improved, back on home oxygen requirement.  Plans for bronchoscopy tomorrow 7/23.  May be done at bedside, will keep in stepdown.  AKI --Essentially resolved.  No further evaluation suggested.  Lung cancer  --Dr. Julien Myers notified of admission  PMH VTE --heparin gtt then back to Xarelto post procedure  Hypothyroidism --Continue levothyroxine  Aortic atherosclerosis --Continue Crestor  Disposition Plan:  Discussion: Overall appears stable.  We will continue antibiotics, oxygen, steroids.  Plan for bronchoscopy tomorrow.  Further recommendations per pulmonology.    Status is: Inpatient  Remains inpatient appropriate because:Inpatient level of care appropriate due to severity of illness   Dispo: The patient is from: Home              Anticipated d/c is to: Home              Anticipated d/c date is: 2 days              Patient currently is not medically stable to d/c.  DVT prophylaxis: heparin infusion peri-procedure (bronch), then back to Xarelto  Code Status: Full Family Communication: None present, or requested  Erin Hodgkins, MD  [facility] Direct contact: see [email or web address] (further directions at bottom of note if needed) 7PM-7AM contact night coverage as at bottom of note 10/03/2019, 9:06 AM  LOS: 3 days    Significant Hospital Events   7/19 admit  Consults:   PCCM  Procedures:    Significant Diagnostic Tests:  CT Chest 09/30/2019-increasing bilateral lower lobe consolidation with nodular appearing areas in the upper lobe.  Micro Data:  Blood culture 7/19 >>   Antimicrobials:  Ceftriaxone 7/19 >> Azithromycin 7/19 >>   Interval History/Subjective  Feels ok today, has intermittent SOB but back on usual outpt O2 requirement. Eating fine.   Objective   Vitals:  Vitals:   10/03/19 0800 10/03/19 0827  BP: (!) 175/59   Pulse: 62   Resp: 17   Temp: 98.3 F (36.8 C)   SpO2: 92% 94%    Exam:  Constitutional:   . Appears calm and comfortable Respiratory:  . Coarse breath sounds bilaterraly, no w/r/r.  . Respiratory effort mildly increased; does get SOB when speaking Cardiovascular:  . RRR, no m/r/g . No LE extremity edema   . telemetry SR Psychiatric:  . Mental status o Mood, affect appropriate . judgment and insight appear intact   I have personally reviewed the following:   Today's Data  . Creatinine down to 1.12 . Mg WNL . Hgb stable 11.3  Scheduled Meds: . Budeson-Glycopyrrol-Formoterol  2 puff Oral BID  . Chlorhexidine Gluconate Cloth  6 each Topical Daily  . clorazepate  3.75 mg Oral BID  . docusate sodium  100 mg Oral BID  . ipratropium-albuterol  3 mL Nebulization QID  . levothyroxine  112 mcg Oral Daily  . mouth rinse  15 mL Mouth Rinse BID  . methylPREDNISolone (SOLU-MEDROL) injection  40 mg Intravenous Q12H  . metoprolol succinate  50 mg Oral BID  . rosuvastatin  10 mg Oral QPM  . sodium chloride flush  10-40 mL Intracatheter Q12H  . valACYclovir  500 mg Oral Daily   Continuous Infusions: . azithromycin Stopped (10/02/19 1801)  . cefTRIAXone (ROCEPHIN)  IV Stopped (10/02/19 1559)  . heparin 1,150 Units/hr (10/03/19 8032)    Principal Problem:   Acute on chronic respiratory failure with hypoxia (HCC) Active Problems:   OSA on CPAP   Essential hypertension   Dyslipidemia, goal LDL below  130   Family history of coronary artery disease   Hx of malignant carcinoid tumor   Sepsis ([place])   Adenocarcinoma of right lung, stage 4 (HCC)   Pneumonitis   Pneumonitis, interstitial (HCC)   AKI (acute kidney injury) ([place])   Lobar pneumonia ([place])   Aortic atherosclerosis ([place])   LOS: 3 days   How to contact the [facility] Attending or Consulting provider [place] or covering provider during after hours [place], for this patient?  1. Check the care team in [facility] and look for a) attending/consulting [facility] provider listed and b) the [facility] team listed 2. Log into [email or web address] and use [place]'s universal password to access. If you do not have the password, please contact the hospital operator. 3. Locate the [facility] provider you are looking for under [facility] and page to a number that you can be directly reached. 4. If you still have difficulty reaching the provider, please page the [facility] (Director on Call) for the Hospitalists listed on amion for assistance.

## 2019-10-03 NOTE — Progress Notes (Signed)
Brief Pharmacy Note:  82 y/oF on IV heparin while PTA Xarelto for hx of VTE on hold. Plan for bronchoscopy tomorrow.    PM aPTT = 64 seconds, remains subtherapeutic despite increase in rate earlier today  CBC: Hgb decreased to 11.3, Pltc WNL  Heparin infusion off earlier today for ~ 30 minutes due to loss of IV access. Heparin resumed at 11:50AM.  No further infusion issues per nursing  No bleeding issues reported per nursing   Plan:  Increase heparin infusion to 1300 units/hr  Stop heparin infusion at midnight per MD orders  F/u resumption of anticoagulation after bronchoscopy tomorrow   Lindell Spar, PharmD, BCPS Clinical Pharmacist  10/03/2019 9:01 PM

## 2019-10-04 ENCOUNTER — Other Ambulatory Visit: Payer: Self-pay | Admitting: Pulmonary Disease

## 2019-10-04 ENCOUNTER — Inpatient Hospital Stay (HOSPITAL_COMMUNITY): Payer: PPO | Admitting: Anesthesiology

## 2019-10-04 ENCOUNTER — Encounter (HOSPITAL_COMMUNITY): Payer: Self-pay | Admitting: Internal Medicine

## 2019-10-04 ENCOUNTER — Inpatient Hospital Stay (HOSPITAL_COMMUNITY): Payer: PPO

## 2019-10-04 ENCOUNTER — Encounter (HOSPITAL_COMMUNITY)
Admission: EM | Disposition: A | Discharge status: Home Health | Payer: Self-pay | Source: Home / Self Care | Attending: Family Medicine

## 2019-10-04 DIAGNOSIS — J9621 Acute and chronic respiratory failure with hypoxia: Secondary | ICD-10-CM

## 2019-10-04 HISTORY — PX: BRONCHIAL BIOPSY: SHX5109

## 2019-10-04 HISTORY — PX: VIDEO BRONCHOSCOPY: SHX5072

## 2019-10-04 HISTORY — PX: BRONCHIAL BRUSHINGS: SHX5108

## 2019-10-04 LAB — CBC
HCT: 42.1 % (ref 36.0–46.0)
Hemoglobin: 12.9 g/dL (ref 12.0–15.0)
MCH: 31.8 pg (ref 26.0–34.0)
MCHC: 30.6 g/dL (ref 30.0–36.0)
MCV: 103.7 fL — ABNORMAL HIGH (ref 80.0–100.0)
Platelets: 181 10*3/uL (ref 150–400)
RBC: 4.06 MIL/uL (ref 3.87–5.11)
RDW: 15.8 % — ABNORMAL HIGH (ref 11.5–15.5)
WBC: 14.7 10*3/uL — ABNORMAL HIGH (ref 4.0–10.5)
nRBC: 0 % (ref 0.0–0.2)

## 2019-10-04 LAB — BASIC METABOLIC PANEL
Anion gap: 8 (ref 5–15)
BUN: 34 mg/dL — ABNORMAL HIGH (ref 8–23)
CO2: 30 mmol/L (ref 22–32)
Calcium: 8.7 mg/dL — ABNORMAL LOW (ref 8.9–10.3)
Chloride: 105 mmol/L (ref 98–111)
Creatinine, Ser: 1.06 mg/dL — ABNORMAL HIGH (ref 0.44–1.00)
GFR calc Af Amer: 59 mL/min — ABNORMAL LOW (ref >60)
GFR calc non Af Amer: 51 mL/min — ABNORMAL LOW (ref >60)
Glucose, Bld: 118 mg/dL — ABNORMAL HIGH (ref 70–99)
Potassium: 4.4 mmol/L (ref 3.5–5.1)
Sodium: 143 mmol/L (ref 135–145)

## 2019-10-04 LAB — BODY FLUID CELL COUNT WITH DIFFERENTIAL
Lymphs, Fluid: 3 %
Lymphs, Fluid: 5 %
Monocyte-Macrophage-Serous Fluid: 6 % — ABNORMAL LOW (ref 50–90)
Monocyte-Macrophage-Serous Fluid: 13 % — ABNORMAL LOW (ref 50–90)
Neutrophil Count, Fluid: 91 % — ABNORMAL HIGH (ref 0–25)
Neutrophil Count, Fluid: 82 % — ABNORMAL HIGH (ref 0–25)
Total Nucleated Cell Count, Fluid: 188 cu mm (ref 0–1000)
Total Nucleated Cell Count, Fluid: 208 cu mm (ref 0–1000)

## 2019-10-04 LAB — LEGIONELLA PNEUMOPHILA SEROGP 1 UR AG: L. pneumophila Serogp 1 Ur Ag: NEGATIVE

## 2019-10-04 LAB — MAGNESIUM: Magnesium: 2.2 mg/dL (ref 1.7–2.4)

## 2019-10-04 LAB — PHOSPHORUS: Phosphorus: 3.8 mg/dL (ref 2.5–4.6)

## 2019-10-04 SURGERY — BRONCHOSCOPY, WITH FLUOROSCOPY
Anesthesia: General

## 2019-10-04 MED ORDER — ROCURONIUM BROMIDE 100 MG/10ML IV SOLN
INTRAVENOUS | Status: DC | PRN
  Administered 2019-10-04: 40 mg via INTRAVENOUS

## 2019-10-04 MED ORDER — ONDANSETRON HCL 4 MG/2ML IJ SOLN
INTRAMUSCULAR | Status: DC | PRN
  Administered 2019-10-04: 4 mg via INTRAVENOUS

## 2019-10-04 MED ORDER — RIVAROXABAN 20 MG PO TABS: 20.0000 mg | ORAL_TABLET | Freq: Every day | ORAL | Status: DC

## 2019-10-04 MED ORDER — LACTATED RINGERS IV SOLN: INTRAVENOUS | Status: DC

## 2019-10-04 MED ORDER — PROPOFOL 10 MG/ML IV BOLUS
INTRAVENOUS | Status: DC | PRN
  Administered 2019-10-04: 100 mg via INTRAVENOUS

## 2019-10-04 MED ORDER — LACTATED RINGERS IV SOLN: INTRAVENOUS | Status: DC | PRN

## 2019-10-04 MED ORDER — RIVAROXABAN 20 MG PO TABS
20.0000 mg | ORAL_TABLET | Freq: Every day | ORAL | Status: DC
  Administered 2019-10-05 – 2019-10-11 (×6): 20 mg via ORAL
  Filled 2019-10-04 (×7): qty 1

## 2019-10-04 MED ORDER — LIDOCAINE HCL (CARDIAC) PF 100 MG/5ML IV SOSY
PREFILLED_SYRINGE | INTRAVENOUS | Status: DC | PRN
  Administered 2019-10-04: 80 mg via INTRAVENOUS

## 2019-10-04 MED ORDER — SUGAMMADEX SODIUM 200 MG/2ML IV SOLN
INTRAVENOUS | Status: DC | PRN
  Administered 2019-10-04: 150 mg via INTRAVENOUS

## 2019-10-04 MED ORDER — ESMOLOL HCL 100 MG/10ML IV SOLN
INTRAVENOUS | Status: DC | PRN
  Administered 2019-10-04: 30 mg via INTRAVENOUS

## 2019-10-04 NOTE — Anesthesia Preprocedure Evaluation (Addendum)
Anesthesia Evaluation  Patient identified by MRN, date of birth, ID band Patient awake    Reviewed: Allergy & Precautions, NPO status , Patient's Chart, lab work & pertinent test results  History of Anesthesia Complications Negative for: history of anesthetic complications  Airway Mallampati: II  TM Distance: >3 FB Neck ROM: Full  Mouth opening: Limited Mouth Opening  Dental  (+) Caps,    Pulmonary shortness of breath and Long-Term Oxygen Therapy, sleep apnea and Continuous Positive Airway Pressure Ventilation , former smoker,  H/o lung cancer and ILD on home O2 4L around the clock, presented to the hospital in acute on chronic respiratory failure   + rhonchi        Cardiovascular hypertension, negative cardio ROS Normal cardiovascular exam     Neuro/Psych negative neurological ROS  negative psych ROS   GI/Hepatic negative GI ROS, Neg liver ROS,   Endo/Other  diabetesHypothyroidism   Renal/GU ARFRenal disease  negative genitourinary   Musculoskeletal negative musculoskeletal ROS (+)   Abdominal   Peds  Hematology negative hematology ROS (+)   Anesthesia Other Findings   Reproductive/Obstetrics                           Anesthesia Physical Anesthesia Plan  ASA: IV  Anesthesia Plan: General   Post-op Pain Management:    Induction: Intravenous  PONV Risk Score and Plan: 3 and Ondansetron, Dexamethasone, Treatment may vary due to age or medical condition and Midazolam  Airway Management Planned: Oral ETT  Additional Equipment: None  Intra-op Plan:   Post-operative Plan: Extubation in OR  Informed Consent: I have reviewed the patients History and Physical, chart, labs and discussed the procedure including the risks, benefits and alternatives for the proposed anesthesia with the patient or authorized representative who has indicated his/her understanding and acceptance.      Dental advisory given  Plan Discussed with:   Anesthesia Plan Comments:         Anesthesia Quick Evaluation

## 2019-10-04 NOTE — Anesthesia Procedure Notes (Signed)
Procedure Name: Intubation Date/Time: 10/04/2019 2:18 PM Performed by: [place] Flossie Wexler C, CRNA Pre-anesthesia Checklist: Patient identified, Emergency Drugs available, Suction available and Patient being monitored Patient Re-evaluated:Patient Re-evaluated prior to induction Oxygen Delivery Method: Circle system utilized Preoxygenation: Pre-oxygenation with 100% oxygen Induction Type: IV induction Ventilation: Mask ventilation without difficulty Laryngoscope Size: Mac and 3 Grade View: Grade III Tube type: Oral Tube size: 8.0 mm Number of attempts: 1 Airway Equipment and Method: Stylet and Oral airway Placement Confirmation: ETT inserted through vocal cords under direct vision,  positive ETCO2 and breath sounds checked- equal and bilateral Tube secured with: Tape Dental Injury: Teeth and Oropharynx as per pre-operative assessment

## 2019-10-04 NOTE — Op Note (Signed)
[facility] Cardiopulmonary Patient Name: Erin Myers Procedure Date: 10/04/2019 MRN: [id number] Attending MD: Marshell Garfinkel , MD Date of Birth: May 27, 1940 CSN: [id number] Age: 79 Admit Type: Inpatient Ethnicity: Not Hispanic or Latino Procedure:             Bronchoscopy Indications:           Unresolving bilateral infiltrates Providers:             Marshell Garfinkel, MD, Kary Kos, Cherylynn Ridges,                         Technician, Stephanie [place] CRNA Referring MD:           Medicines:             General Anesthesia Complications:         No immediate complications Estimated Blood Loss:  Estimated blood loss was minimal. Procedure:      Pre-Anesthesia Assessment:      - A History and Physical has been performed. Patient meds and allergies       have been reviewed. The risks and benefits of the procedure and the       sedation options and risks were discussed with the patient. All       questions were answered and informed consent was obtained. Patient       identification and proposed procedure were verified prior to the       procedure by the physician in the pre-procedure area. Mental Status       Examination: alert and oriented. Airway Examination: normal       oropharyngeal airway. Respiratory Examination: bibasilar crackles. CV       Examination: RRR, no murmurs, no S3 or S4. ASA Grade Assessment: II - A       patient with mild systemic disease. After reviewing the risks and       benefits, the patient was deemed in satisfactory condition to undergo       the procedure. The anesthesia plan was to use general anesthesia.       Immediately prior to administration of medications, the patient was       re-assessed for adequacy to receive sedatives. The heart rate,       respiratory rate, oxygen saturations, blood pressure, adequacy of       pulmonary ventilation, and response to care were monitored throughout       the procedure. The physical status of the  patient was re-assessed after       the procedure.      After obtaining informed consent, the bronchoscope was passed under       direct vision. Throughout the procedure, the patient's blood pressure,       pulse, and oxygen saturations were monitored continuously. the [id number]       ([id number] Olympus therapeutic bronchoscope was introduced through the       nose, via the endotracheal tube (the patient was intubated for the       procedure) and advanced to the carina. Findings:      Mucoid serosanguinous. secretions noted bilaterally and we present most       prominent in the right lower lobe. Bronchoalveolar lavage was performed       in the RLL posterior basal segment (B10) and in the LLL posterior basal       segment (B10) of the lung and sent for cell count, bacterial culture,  viral smears & culture, and fungal & AFB analysis and cytology. 180 mL       of fluid were instilled. 80 mL were returned. The return was mucoid and       serosanguinous. There were no mucoid plugs in the return fluid. Multiple       specimens were obtained and pooled into one specimen, which was sent for       analysis.      Fluoroscopy guided protected brushings of an area of infiltration were       obtained in the superior segment of the right lower lobe, in the lateral       basal segment of the right lower lobe, in the posterior basal segment of       the right lower lobe and in the posterior basal segment of the left       lower lobe with a cytology brush and sent for routine cytology. Five       samples were obtained. Transbronchial brushing technique was selected       because the sampling site was not visible endoscopically.      Transbronchial biopsies of an area of infiltration were performed in the       superior segment of the right lower lobe, in the lateral basal segment       of the right lower lobe and in the posterior basal segment of the right       lower lobe using alligator forceps  and sent for histopathology       examination. The procedure was guided by fluoroscopy. Transbronchial       biopsy technique was selected because the sampling site was not visible       endoscopically. Five biopsy passes were performed. Five biopsy samples       were obtained. Impression:      - Unresolving bilateral infiltrates      - Bronchoalveolar lavage was performed.      - Transbronchial brushings were obtained.      - Transbronchial lung biopsies were performed. Moderate Sedation:      NA Recommendation:      - Await BAL, biopsy and brushing results. Procedure Code(s):      --- Professional ---      [id number], Bronchoscopy, rigid or flexible, including fluoroscopic guidance,       when performed; with transbronchial lung biopsy(s), single lobe      01601, Bronchoscopy, rigid or flexible, including fluoroscopic guidance,       when performed; with bronchial alveolar lavage      [id number], Bronchoscopy, rigid or flexible, including fluoroscopic guidance,       when performed; with brushing or protected brushings Diagnosis Code(s):      --- Professional ---      R91.8, Other nonspecific abnormal finding of lung field CPT copyright 2019 American Medical Association. All rights reserved. The codes documented in this report are preliminary and upon coder review may  be revised to meet current compliance requirements. Marshell Garfinkel, MD 10/04/2019 3:44:48 PM Number of Addenda: 0 Scope In: Scope Out:

## 2019-10-04 NOTE — Progress Notes (Signed)
PROGRESS NOTE  Erin Myers WJX:[id number] DOB: 1940-12-03 DOA: 09/30/2019 PCP: [name], MD  Brief History   79 year old woman PMH including lung cancer and chemotherapy, interstitial lung disease, COPD, chronic hypoxic respiratory failure on 4 L presented with shortness of breath.  Admitted for acute on chronic hypoxic respiratory failure secondary to pneumonia superimposed on interstitial lung disease and lung cancer.  A & P  Acute on chronic hypoxic resp failure (on 4L [place] at home), secondary to multilobar pneumonia, superimposed on ILD and lung cancer --Overall remained stable for last few days but not back to baseline.  Subjective improvement about 40%.   --Plans for bronchoscopy today.   --Continue empiric antibiotics, steroids, bronchodilators  AKI --Resolved.  No further evaluation suggested.  Lung cancer  --Followed by Dr. Earlie Server, seen by NP 7/22.  PMH VTE --Heparin gtt then back to Xarelto post procedure  Hypothyroidism --Continue levothyroxine  Aortic atherosclerosis --Continue Crestor  Disposition Plan:  Discussion: Overall stable.  Bronchoscopy today.  Further management recommendations per pulmonology.  Status is: Inpatient  Remains inpatient appropriate because:Inpatient level of care appropriate due to severity of illness  Dispo: The patient is from: Home              Anticipated d/c is to: Home              Anticipated d/c date is: 2 days              Patient currently is not medically stable to d/c.  DVT prophylaxis: heparin infusion peri-procedure (bronch), then back to Xarelto  Code Status: Full Family Communication: None present, or requested 7/23  Murray Hodgkins, MD  [facility] Direct contact: see [email or web address] (further directions at bottom of note if needed) 7PM-7AM contact night coverage as at bottom of note 10/04/2019, 10:02 AM  LOS: 4 days    Significant Hospital Events   7/19 admit  Consults:   PCCM  Procedures:    Significant Diagnostic Tests:  CT Chest 09/30/2019-increasing bilateral lower lobe consolidation with nodular appearing areas in the upper lobe.  Micro Data:  Blood culture 7/19 >>   Antimicrobials:  Ceftriaxone 7/19 >> Azithromycin 7/19 >>   Interval History/Subjective  Slept ok. Breathing ok, but only 40% better since admission Eating well.  Objective   Vitals:  Vitals:   10/04/19 0700 10/04/19 0809  BP: (!) 168/58   Pulse: 58   Resp: 13   Temp:  97.8 F (36.6 C)  SpO2: 90% 92%    Exam:  Constitutional:   . Appears calm and comfortable Respiratory:  . Coarse breath sounds bilaterally, no wheezes are rales appreciated. [name] Respiratory effort mildly increased. Short of breath when speaking. Cardiovascular:  . RRR, no m/r/g . No LE extremity edema   . Telemetry SR Psychiatric:  . Mental status o Mood, affect appropriate  I have personally reviewed the following:   Today's Data  . UOP 2250 . Creatinine 1.06, stable, BUN down to 34 . Mg and Phos WNL . WBC down to 14.7. Hgb and plts stable.  Scheduled Meds: . Budeson-Glycopyrrol-Formoterol  2 puff Oral BID  . Chlorhexidine Gluconate Cloth  6 each Topical Daily  . clorazepate  3.75 mg Oral BID  . docusate sodium  100 mg Oral BID  . ipratropium-albuterol  3 mL Nebulization TID  . levothyroxine  112 mcg Oral Daily  . mouth rinse  15 mL Mouth Rinse BID  . methylPREDNISolone (SOLU-MEDROL) injection  40 mg Intravenous Q12H  . metoprolol  succinate  50 mg Oral BID  . rosuvastatin  10 mg Oral QPM  . sodium chloride flush  10-40 mL Intracatheter Q12H  . valACYclovir  500 mg Oral Daily   Continuous Infusions: . azithromycin Stopped (10/03/19 1700)  . cefTRIAXone (ROCEPHIN)  IV Stopped (10/03/19 1553)    Principal Problem:   Acute on chronic respiratory failure with hypoxia (HCC) Active Problems:   OSA on CPAP   Essential hypertension   Dyslipidemia, goal LDL below 130   Family  history of coronary artery disease   Hx of malignant carcinoid tumor   Sepsis ([place])   Adenocarcinoma of right lung, stage 4 (HCC)   Pneumonitis   Pneumonitis, interstitial (HCC)   AKI (acute kidney injury) ([place])   Lobar pneumonia ([place])   Aortic atherosclerosis ([place])   LOS: 4 days   How to contact the [facility] Attending or Consulting provider [place] or covering provider during after hours [place], for this patient?  1. Check the care team in [facility] and look for a) attending/consulting TRH provider listed and b) the [facility] team listed 2. Log into [email or web address] and use [place]'s universal password to access. If you do not have the password, please contact the hospital operator. 3. Locate the [facility] provider you are looking for under [facility] and page to a number that you can be directly reached. 4. If you still have difficulty reaching the provider, please page the [facility] (Director on Call) for the Hospitalists listed on amion for assistance.

## 2019-10-04 NOTE — Progress Notes (Addendum)
NAME:  Erin Myers, MRN:  [id number], DOB:  [birth year], LOS: 4 ADMISSION DATE:  09/30/2019, CONSULTATION DATE: 09/30/2019 REFERRING MD:  Rhetta Mura MD, CHIEF COMPLAINT: Acute hypoxic respiratory failure  Brief History   79 year old with stage IV adenocarcinoma diagnosed in May 2019, PE, [place] related interstitial pneumonitis  Admitted with hypoxic respiratory failure, worsening bilateral pulmonary infiltrates.  PCCM consulted for help with management  History of present illness   Lung cancer diagnosed in May 2019, treated with systemic chemotherapy and [place], Keytruda. She developed interstitial pneumonitis secondary to [facility] earlier this year treated with prednisone and Keytruda therapy was on hold. She has received several rounds of prednisone recently for immunotherapy related pneumonitis  Past Medical History    has a past medical history of Anxiety, Diabetes mellitus without complication ([place]), Dyslipidemia, HTN (hypertension), Hyperlipemia, Hypothyroid, Hypothyroidism, Lung cancer ([place]) (2000), Obesity, Ovarian cancer ([place]), Palpitations (2009 and 2006), and Sleep apnea.  Significant Hospital Events   7/19 admit  Consults:  PCCM  Procedures:    Significant Diagnostic Tests:  CT Chest 09/30/2019-increasing bilateral lower lobe consolidation with nodular appearing areas in the upper lobe.  Micro Data:  Blood culture 7/19 >>  U strep pneumo 10/02/19 >> negative U legionella 7/21 >> Pending  Antimicrobials:  Ceftriaxone 7/19 >> Azithromycin 7/19 >>   Interim history/subjective:   Continues on 4 L oxygen.  States that breathing and hemoptysis is improving  Objective   Blood pressure (!) 168/58, pulse 58, temperature 97.8 F (36.6 C), temperature source Oral, resp. rate 13, height _0  (1.651 m), weight 76.2 kg, SpO2 92 %.        Intake/Output Summary (Last 24 hours) at 10/04/2019 0845 Last data filed at 10/03/2019 2230 Gross per 24 hour  Intake 1327.88 ml   Output 1650 ml  Net -322.12 ml   Filed Weights   09/30/19 1226  Weight: 76.2 kg    Examination: Gen:      No acute distress HEENT:  EOMI, sclera anicteric Neck:     No masses; no thyromegaly Lungs:    Clear to auscultation bilaterally; normal respiratory effort CV:         Regular rate and rhythm; no murmurs Abd:      + bowel sounds; soft, non-tender; no palpable masses, no distension Ext:    No edema; adequate peripheral perfusion Skin:      Warm and dry; no rash Neuro: alert and oriented x 3 Psych: normal mood and affect  Resolved Hospital Problem list     Assessment & Plan:   Acute on Chronic hypoxic respiratory failure with worsening bilateral lung infiltrates -At baseline on 4L [place] with Saturations 90-93% per patient  History of stage IV lung cancer Recurrent immunotherapy related pneumonitis Differential diagnosis at this point includes pneumonia versus immunotherapy related pneumonitis or progression of lung cancer Plan -Titrate Supplemental Oxygen for Saturation Goal > 90 -Continue Solu-Medrol (Currently on 40 mg BID) > Will Plan for Slow Taper   -Plan for bronchoscopy today with BAL, biopsy Risk-benefit discussed with patient and she has agreed to proceed.  H/O Pulmonary embolism Plan Holding heparin for procedure  Best practice:  Diet: PO DVT prophylaxis: on home a/c Glucose control: Monitor Mobility: Bed Code Status: Full Family Communication: Patient updated Disposition: intermediate care unit.   Labs   CBC: Recent Labs  Lab 09/30/19 1325 10/01/19 0450 10/02/19 0548 10/03/19 0332 10/04/19 0503  WBC 29.6* 17.0* 19.6* 16.1* 14.7*  NEUTROABS  --   --  18.0*  --   --   HGB 13.2 12.0 11.5* 11.3* 12.9  HCT 43.1 39.6 38.1 37.4 42.1  MCV 103.9* 104.8* 105.5* 104.8* 103.7*  PLT 217 170 177 163 865    Basic Metabolic Panel: Recent Labs  Lab 09/30/19 1325 10/01/19 0450 10/02/19 0548 10/03/19 0332 10/04/19 0503  NA 140 143 144 144 143  K  5.2* 5.0 4.6 4.5 4.4  CL 97* 104 105 106 105  CO2 _0 GLUCOSE 124* 152* 152* 158* 118*  BUN 44* 40* 35* 37* 34*  CREATININE 1.89* 1.55* 1.28* 1.12* 1.06*  CALCIUM 8.9 8.9 8.7* 8.5* 8.7*  MG  --   --   --  2.1 2.2  PHOS  --   --   --  3.9 3.8   GFR: Estimated Creatinine Clearance: 46.1 mL/min (A) (by C-G formula based on SCr of 1.06 mg/dL (H)). Recent Labs  Lab 10/01/19 0450 10/02/19 0548 10/03/19 0332 10/04/19 0503  PROCALCITON 30.97 16.93  --   --   WBC 17.0* 19.6* 16.1* 14.7*    Liver Function Tests: Recent Labs  Lab 09/30/19 1325 10/01/19 0450  AST 18 17  ALT 21 18  ALKPHOS 53 51  BILITOT 0.6 0.7  PROT 5.8* 5.9*  ALBUMIN 3.0* 2.8*   No results for input(s): LIPASE, AMYLASE in the last 168 hours. No results for input(s): AMMONIA in the last 168 hours.  ABG No results found for: PHART, PCO2ART, PO2ART, HCO3, TCO2, ACIDBASEDEF, O2SAT   Coagulation Profile: No results for input(s): INR, PROTIME in the last 168 hours.  Cardiac Enzymes: No results for input(s): CKTOTAL, CKMB, CKMBINDEX, TROPONINI in the last 168 hours.  HbA1C: Hgb A1c MFr Bld  Date/Time Value Ref Range Status  07/19/2017 06:16 AM 6.2 (H) 4.8 - 5.6 % Final    Comment:    (NOTE) Pre diabetes:          5.7%-6.4% Diabetes:              >6.4% Glycemic control for   <7.0% adults with diabetes     CBG: No results for input(s): GLUCAP in the last 168 hours.  Marshell Garfinkel MD [place] [facility] Please see [email or web address] for pager details.  10/04/2019, 8:48 AM

## 2019-10-04 NOTE — Transfer of Care (Signed)
Immediate Anesthesia Transfer of Care Note  Patient: [name]  Procedure(s) Performed: VIDEO BRONCHOSCOPY WITH FLUORO (N/A )  Patient Location: PACU and Endoscopy Unit  Anesthesia Type:General  Level of Consciousness: awake, alert  and oriented  Airway & Oxygen Therapy: Patient Spontanous Breathing and Patient connected to face mask oxygen  Post-op Assessment: Report given to RN and Post -op Vital signs reviewed and stable  Post vital signs: Reviewed and stable  Last Vitals:  Vitals Value Taken Time  BP    Temp    Pulse 75 10/04/19 1550  Resp 30 10/04/19 1550  SpO2 93 % 10/04/19 1550  Vitals shown include unvalidated device data.  Last Pain:  Vitals:   10/04/19 1318  TempSrc: Oral  PainSc: 0-No pain         Complications: No complications documented.

## 2019-10-05 ENCOUNTER — Inpatient Hospital Stay (HOSPITAL_COMMUNITY): Payer: PPO

## 2019-10-05 LAB — CBC
HCT: 43 % (ref 36.0–46.0)
Hemoglobin: 13.3 g/dL (ref 12.0–15.0)
MCH: 31.7 pg (ref 26.0–34.0)
MCHC: 30.9 g/dL (ref 30.0–36.0)
MCV: 102.4 fL — ABNORMAL HIGH (ref 80.0–100.0)
Platelets: 187 10*3/uL (ref 150–400)
RBC: 4.2 MIL/uL (ref 3.87–5.11)
RDW: 15.9 % — ABNORMAL HIGH (ref 11.5–15.5)
WBC: 19.2 10*3/uL — ABNORMAL HIGH (ref 4.0–10.5)
nRBC: 0 % (ref 0.0–0.2)

## 2019-10-05 LAB — CULTURE, BLOOD (SINGLE)
Culture: NO GROWTH
Special Requests: ADEQUATE

## 2019-10-05 MED ORDER — AMLODIPINE BESYLATE 5 MG PO TABS
5.0000 mg | ORAL_TABLET | Freq: Every day | ORAL | Status: DC
  Administered 2019-10-05 – 2019-10-12 (×8): 5 mg via ORAL
  Filled 2019-10-05 (×8): qty 1

## 2019-10-05 MED ORDER — PREDNISONE 20 MG PO TABS
40.0000 mg | ORAL_TABLET | Freq: Every day | ORAL | Status: DC
  Administered 2019-10-06 – 2019-10-12 (×7): 40 mg via ORAL
  Filled 2019-10-05 (×7): qty 2

## 2019-10-05 NOTE — Progress Notes (Addendum)
PROGRESS NOTE  Erin Myers TDD:[id number] DOB: 1940-07-24 DOA: 09/30/2019 PCP: [name], MD  Brief History   79 year old woman PMH including lung cancer and chemotherapy, interstitial lung disease, COPD, chronic hypoxic respiratory failure on 4 L presented with shortness of breath.  Admitted for acute on chronic hypoxic respiratory failure secondary to pneumonia superimposed on interstitial lung disease and lung cancer.  A & P  Acute on chronic hypoxic resp failure (on 4L [place] at home), secondary to multilobar pneumonia, superimposed on ILD and lung cancer --CXR looks a little worse. Clinically appears similar but oxygen up to 6 for SOB and hypoxia earlier. --s/p bronch 7/23, cultures, biopsy and BAL pending --Continue empiric antibiotics, change to oral steroids tomorrow, continue bronchodilators  AKI --Resolved.  No further evaluation suggested.  Lung cancer  --Followed by Dr. Earlie Server, seen by NP 7/22.  PMH VTE -- Xarelto post procedure  Hypothyroidism --continue levothyroxine  Aortic atherosclerosis --Continue Crestor  Disposition Plan:  Discussion: appears stable clinically though oxygen up to 6 from 5 and CXR similar or slightly worse; nevertheless can transfer out of SDU. D/w Dr. Wonda Amis, will continue steroids and abx. He will f/u 7/25 and make recs, may be able to go home.  Status is: Inpatient  Remains inpatient appropriate because:Inpatient level of care appropriate due to severity of illness  Dispo: The patient is from: Home              Anticipated d/c is to: Home              Anticipated d/c date is: 1 day              Patient currently is not medically stable to d/c.  DVT prophylaxis:  rivaroxaban (XARELTO) tablet 20 mg  Code Status: Full Family Communication: None present, or requested 7/24  Murray Hodgkins, MD  [facility] Direct contact: see [email or web address] (further directions at bottom of note if needed) 7PM-7AM contact night coverage  as at bottom of note 10/05/2019, 11:26 AM  LOS: 5 days    Significant Hospital Events   7/19 admit  Consults:  PCCM  Procedures:  7/23 bronchoscopy: unresolving bilateral infiltrates  Significant Diagnostic Tests:  CT Chest 09/30/2019-increasing bilateral lower lobe consolidation with nodular appearing areas in the upper lobe.  Micro Data:  Blood culture 7/19 >>   Antimicrobials:  Ceftriaxone 7/19 >> Azithromycin 7/19 >>   Interval History/Subjective  Ok today, slept well, breathing ok. Was SOB earlier and oxygen was bumped up to 5-6  Objective   Vitals:  Vitals:   10/05/19 1100 10/05/19 1123  BP: (!) 168/50   Pulse: 63 65  Resp: 21 22  Temp:    SpO2: 91% 91%    Exam:  Constitutional:   . Appears calm and comfortable Respiratory:  . CTA bilateral coarse breath sounds . Respiratory effort mildly increased Cardiovascular:  . RRR, no m/r/g . No LE extremity edema   . Telemetry SR Psychiatric:  . Mental status o Mood, affect appropriate . judgment and insight appear intact   I have personally reviewed the following:   Today's Data  . WBC up to 19.2 but on steriods . Remainder CBC unremarkable  Scheduled Meds: . amLODipine  5 mg Oral Daily  . Budeson-Glycopyrrol-Formoterol  2 puff Oral BID  . Chlorhexidine Gluconate Cloth  6 each Topical Daily  . clorazepate  3.75 mg Oral BID  . docusate sodium  100 mg Oral BID  . ipratropium-albuterol  3 mL Nebulization TID  .  levothyroxine  112 mcg Oral Daily  . mouth rinse  15 mL Mouth Rinse BID  . metoprolol succinate  50 mg Oral BID  . [START ON 10/06/2019] predniSONE  40 mg Oral Q breakfast  . rivaroxaban  20 mg Oral Q breakfast  . rosuvastatin  10 mg Oral QPM  . sodium chloride flush  10-40 mL Intracatheter Q12H  . valACYclovir  500 mg Oral Daily   Continuous Infusions: . azithromycin Stopped (10/03/19 1700)  . cefTRIAXone (ROCEPHIN)  IV Stopped (10/03/19 1553)    Principal Problem:   Acute on  chronic respiratory failure with hypoxia (HCC) Active Problems:   OSA on CPAP   Essential hypertension   Dyslipidemia, goal LDL below 130   Family history of coronary artery disease   Hx of malignant carcinoid tumor   Sepsis ([place])   Adenocarcinoma of right lung, stage 4 (HCC)   Pneumonitis   Pneumonitis, interstitial (HCC)   AKI (acute kidney injury) ([place])   Lobar pneumonia ([place])   Aortic atherosclerosis ([place])   LOS: 5 days   How to contact the [facility] Attending or Consulting provider [place] or covering provider during after hours [place], for this patient?  1. Check the care team in [facility] and look for a) attending/consulting TRH provider listed and b) the [facility] team listed 2. Log into [email or web address] and use [place]'s universal password to access. If you do not have the password, please contact the hospital operator. 3. Locate the [facility] provider you are looking for under [facility] and page to a number that you can be directly reached. 4. If you still have difficulty reaching the provider, please page the [facility] (Director on Call) for the Hospitalists listed on amion for assistance.

## 2019-10-06 ENCOUNTER — Inpatient Hospital Stay (HOSPITAL_COMMUNITY): Payer: PPO

## 2019-10-06 ENCOUNTER — Encounter (HOSPITAL_COMMUNITY): Payer: Self-pay | Admitting: Pulmonary Disease

## 2019-10-06 LAB — CBC
HCT: 45.2 % (ref 36.0–46.0)
Hemoglobin: 13.6 g/dL (ref 12.0–15.0)
MCH: 31.3 pg (ref 26.0–34.0)
MCHC: 30.1 g/dL (ref 30.0–36.0)
MCV: 103.9 fL — ABNORMAL HIGH (ref 80.0–100.0)
Platelets: 177 10*3/uL (ref 150–400)
RBC: 4.35 MIL/uL (ref 3.87–5.11)
RDW: 16 % — ABNORMAL HIGH (ref 11.5–15.5)
WBC: 21 10*3/uL — ABNORMAL HIGH (ref 4.0–10.5)
nRBC: 0 % (ref 0.0–0.2)

## 2019-10-06 MED ORDER — VANCOMYCIN HCL 1500 MG/300ML IV SOLN
1500.0000 mg | Freq: Once | INTRAVENOUS | Status: AC
  Administered 2019-10-06: 1500 mg via INTRAVENOUS
  Filled 2019-10-06: qty 300

## 2019-10-06 MED ORDER — PIPERACILLIN-TAZOBACTAM 3.375 G IVPB
3.3750 g | Freq: Three times a day (TID) | INTRAVENOUS | Status: DC
  Administered 2019-10-06 – 2019-10-08 (×6): 3.375 g via INTRAVENOUS
  Filled 2019-10-06 (×6): qty 50

## 2019-10-06 MED ORDER — ALBUTEROL SULFATE (2.5 MG/3ML) 0.083% IN NEBU: 2.5000 mg | INHALATION_SOLUTION | RESPIRATORY_TRACT | Status: DC | PRN

## 2019-10-06 MED ORDER — VANCOMYCIN HCL IN DEXTROSE 1-5 GM/200ML-% IV SOLN
1000.0000 mg | INTRAVENOUS | Status: DC
  Administered 2019-10-07 – 2019-10-08 (×2): 1000 mg via INTRAVENOUS
  Filled 2019-10-06 (×2): qty 200

## 2019-10-06 MED ORDER — GUAIFENESIN-DM 100-10 MG/5ML PO SYRP
5.0000 mL | ORAL_SOLUTION | ORAL | Status: DC | PRN
  Administered 2019-10-11: 5 mL via ORAL
  Filled 2019-10-06: qty 10

## 2019-10-06 NOTE — Progress Notes (Signed)
PROGRESS NOTE  Erin Myers ZOX:[id number] DOB: 10-Nov-1940 DOA: 09/30/2019 PCP: [name], MD  Brief History   79 year old woman PMH including lung cancer and chemotherapy, interstitial lung disease, COPD, chronic hypoxic respiratory failure on 4 L presented with shortness of breath.  Admitted for acute on chronic hypoxic respiratory failure secondary to pneumonia superimposed on interstitial lung disease and lung cancer.  A & P  Acute on chronic hypoxic resp failure (on 4L [place] at home), secondary to multilobar pneumonia, superimposed on ILD and lung cancer --finished abx 7/24 and was transferred to med-surg. --today now w/ increasd O2 requirement up to 15L. No acute distress --CXR looks worse on the right. Will transfer to progressive and institute abx --s/p bronch 7/23, cultures, biopsy and BAL pending --Continue oral steroids. continue bronchodilators  AKI --Resolved.  No further evaluation suggested.  Lung cancer  --Followed by Dr. Earlie Server, seen by NP 7/22.  PMH VTE --continue Xarelto    Hypothyroidism --continuelevothyroxine  Aortic atherosclerosis --Continue Crestor  Disposition Plan:  Discussion: appears a little worse today; increased O2 requriement. Needs higher level of care. Discussed w/ Dr. Vaughan Browner, will follow. Initiate abx.   Status is: Inpatient  Remains inpatient appropriate because:Inpatient level of care appropriate due to severity of illness  Dispo: The patient is from: Home              Anticipated d/c is to: Home              Anticipated d/c date is: 3 days              Patient currently is not medically stable to d/c.  DVT prophylaxis:  rivaroxaban (XARELTO) tablet 20 mg  Code Status: Full Family Communication: husband at bedside today  Murray Hodgkins, MD  [facility] Direct contact: see [email or web address] (further directions at bottom of note if needed) 7PM-7AM contact night coverage as at bottom of note 10/06/2019, 1:36 PM  LOS:  6 days    Significant Hospital Events   7/19 admit  Consults:  PCCM Oncology   Procedures:  7/23 bronchoscopy: unresolving bilateral infiltrates  Significant Diagnostic Tests:  CT Chest 09/30/2019-increasing bilateral lower lobe consolidation with nodular appearing areas in the upper lobe.  Micro Data:  Blood culture 7/19 >> NG/F Pleural fluid 7/23 >   Antimicrobials:  Ceftriaxone 7/19 >> 7/24 Azithromycin 7/19 >> 7/24 Vanc 7/25 > Zosyn 7/25 >  Interval History/Subjective  Didn't end up using CPAP last night This AM called for rapid response given increased oxygen requirement Pt feels ok, a little more SOB. No other issues.  Objective   Vitals:  Vitals:   10/06/19 0841 10/06/19 1316  BP:    Pulse:    Resp:    Temp:    SpO2: 90% 92%    Exam: Constitutional:   . Appears calm, mildly uncomfortable, ill but not toxic. Brushing teeth, siting in chair. ENMT:  . grossly normal hearing  Respiratory:  . CTA bilaterally, no w/r/r.  . Respiratory effort normal. Cardiovascular:  . RRR, no m/r/g . No LE extremity edema   Psychiatric:  . Mental status o Mood, affect appropriate  I have personally reviewed the following:   Today's Data  . WBC up to 21.0 but on steriods . Remainder CBC unremarkable  Scheduled Meds: . amLODipine  5 mg Oral Daily  . Budeson-Glycopyrrol-Formoterol  2 puff Oral BID  . Chlorhexidine Gluconate Cloth  6 each Topical Daily  . clorazepate  3.75 mg Oral BID  . docusate  sodium  100 mg Oral BID  . ipratropium-albuterol  3 mL Nebulization TID  . levothyroxine  112 mcg Oral Daily  . mouth rinse  15 mL Mouth Rinse BID  . metoprolol succinate  50 mg Oral BID  . predniSONE  40 mg Oral Q breakfast  . rivaroxaban  20 mg Oral Q breakfast  . rosuvastatin  10 mg Oral QPM  . sodium chloride flush  10-40 mL Intracatheter Q12H  . valACYclovir  500 mg Oral Daily   Continuous Infusions: . piperacillin-tazobactam (ZOSYN)  IV 3.375 g (10/06/19  1102)  . [START ON 10/07/2019] vancomycin      Principal Problem:   Acute on chronic respiratory failure with hypoxia (HCC) Active Problems:   OSA on CPAP   Essential hypertension   Dyslipidemia, goal LDL below 130   Family history of coronary artery disease   Hx of malignant carcinoid tumor   Sepsis ([place])   Adenocarcinoma of right lung, stage 4 (HCC)   Pneumonitis   Pneumonitis, interstitial (HCC)   AKI (acute kidney injury) ([place])   Lobar pneumonia ([place])   Aortic atherosclerosis ([place])   LOS: 6 days   How to contact the [facility] Attending or Consulting provider 7A - 7P or covering provider during after hours 7P -7A, for this patient?  1. Check the care team in [facility] and look for a) attending/consulting TR[facility] provider listed and b) the [facility] team listed 2. Log into [email or web address] and use [place]'s universal password to access. If you do not have the password, please contact the hospital operator. 3. Locate the [facility] provider you are looking for under [facility] and page to a number that you can be directly reached. 4. If you still have difficulty reaching the provider, please page the [facility] (Director on Call) for the Hospitalists listed on amion for assistance.

## 2019-10-06 NOTE — Progress Notes (Signed)
Pharmacy Antibiotic Note  Erin Myers is a 79 y.o. female with a PMH including lung cancer and chemo, ILD, COPD on 4L O2  admitted on 09/30/2019 with pneumonia. Patient was initially treated with azithromycin and ceftriazone. Pharmacy has been consulted for vancomycin and Zosyn dosing.  Plan: Zosyn 3.375 g EI q 8 hours  Vancomycin 1500 mg iv once followed by 1000 mg iv q 24h  F/U renal function, culture results, and clinical course  Levels as indicated  Height: _0  (165.1 cm) Weight: 81.6 kg (180 lb) IBW/kg (Calculated) : 57  Temp (24hrs), Avg:98.1 F (36.7 C), Min:97.7 F (36.5 C), Max:98.7 F (37.1 C)  Recent Labs  Lab 09/30/19 1325 09/30/19 1325 10/01/19 0450 10/01/19 0450 10/02/19 0548 10/03/19 0332 10/04/19 0503 10/05/19 0500 10/06/19 0626  WBC 29.6*   < > 17.0*   < > 19.6* 16.1* 14.7* 19.2* 21.0*  CREATININE 1.89*  --  1.55*  --  1.28* 1.12* 1.06*  --   --    < > = values in this interval not displayed.    Estimated Creatinine Clearance: 47.6 mL/min (A) (by C-G formula based on SCr of 1.06 mg/dL (H)).    Allergies  Allergen Reactions  . Pembrolizumab Anaphylaxis  . Adhesive [Tape] Other (See Comments)    Irritates skin   . Codeine Other (See Comments)    Skin crawlinf feeling  . Lactose Intolerance (Gi) Diarrhea  . Monosodium Glutamate Diarrhea  . Sulfa Antibiotics Nausea And Vomiting    Antimicrobials this admission:  7/19 azith> 7/24 7/19 CTX>> 7/24 7/20 valacylovir(as PTA)> 7/25 vancomycin >>  7/25 Zosyn >> Dose adjustments this admission:   Microbiology results:  7/23 BAL: pending 7/21 legionella: neg 7/21 strep pneumo: neg 7/20 MRSA neg 7/19 BCx2: ngf  Thank you for allowing pharmacy to be a part of this patient's care.  Ulice Dash D 10/06/2019 10:37 AM

## 2019-10-06 NOTE — Progress Notes (Signed)
Patient uses 4 O2 via nasal canula at home. She is presently on HFNC 15% and Sating 88-90%. Rapid Response called to evaluate the patient, she was not in distress. During the night the nurse had to keep increasing the O2 and the Sats would still not stay above 90%. Clarise Cruz came from ICU to evaluate Mrs Erin Myers, she spoke with Dr. Tammi Klippel and Dr. Sarajane Jews about the patient's O2 sats. It was ordered to have her transferred to 4 [place], stepdown Unit. She was transferred via wheelchair with 15% O2 tank via [place], her husband was with her. Patient was having coughing spells and would cough up clear liquid. Alizzon states she didn't have her C-Pap on last night, stating she fell asleep and when she woke up she realized she didn't have it on.

## 2019-10-06 NOTE — Progress Notes (Signed)
NAME:  Erin Myers, MRN:  [id number], DOB:  April 18, 1940, LOS: 6 ADMISSION DATE:  09/30/2019, CONSULTATION DATE: 09/30/2019 REFERRING MD:  Rhetta Mura MD, CHIEF COMPLAINT: Acute hypoxic respiratory failure  Brief History   79 year old with stage IV adenocarcinoma diagnosed in May 2019, PE, [place] related interstitial pneumonitis  Admitted with hypoxic respiratory failure, worsening bilateral pulmonary infiltrates.  PCCM consulted for help with management  History of present illness   Lung cancer diagnosed in May 2019, treated with systemic chemotherapy and Alimta, Keytruda. She developed interstitial pneumonitis secondary to [facility] earlier this year treated with prednisone and Keytruda therapy was on hold. She has received several rounds of prednisone recently for immunotherapy related pneumonitis  Past Medical History    has a past medical history of Anxiety, Diabetes mellitus without complication ([place]), Dyslipidemia, HTN (hypertension), Hyperlipemia, Hypothyroid, Hypothyroidism, Lung cancer ([place]) (2000), Obesity, Ovarian cancer ([place]), Palpitations (2009 and 2006), and Sleep apnea.  Significant Hospital Events   7/19 admit 7/23 bronchoscopy 7/25 transfer to PCU for worsening oxygen requirement  Consults:  PCCM  Procedures:    Significant Diagnostic Tests:  CT Chest 09/30/2019-increasing bilateral lower lobe consolidation with nodular appearing areas in the upper lobe.  Micro Data:  Blood culture 7/19 >>  U strep pneumo 10/02/19 >> negative U legionella 7/21 >> Pending  Antimicrobials:  Ceftriaxone 7/19 >> Azithromycin 7/19 >>   Interim history/subjective:   Had a coughing episode today with desaturation. Now on 15 L oxygen Chest x-ray shows slightly worse infiltrate.  Objective   Blood pressure (!) 146/59, pulse 78, temperature 98.1 F (36.7 C), temperature source Oral, resp. rate 16, height 5\' 5"  (1.651 m), weight 81.6 kg, SpO2 94 %.        Intake/Output  Summary (Last 24 hours) at 10/06/2019 1852 Last data filed at 10/06/2019 1800 Gross per 24 hour  Intake 916.42 ml  Output 1200 ml  Net -283.58 ml   Filed Weights   09/30/19 1226 10/04/19 1546 10/05/19 1557  Weight: 76.2 kg 76.2 kg 81.6 kg    Examination: Gen:      No acute distress HEENT:  EOMI, sclera anicteric Neck:     No masses; no thyromegaly Lungs:    Clear to auscultation bilaterally; normal respiratory effort CV:         Regular rate and rhythm; no murmurs Abd:      + bowel sounds; soft, non-tender; no palpable masses, no distension Ext:    No edema; adequate peripheral perfusion Skin:      Warm and dry; no rash Neuro: alert and oriented x 3 Psych: normal mood and affect  Chest x-ray 7/25-slight increase in airspace opacities in the right lung.  Unchanged baseline bilateral infiltrates.  Resolved Hospital Problem list     Assessment & Plan:   Acute on Chronic hypoxic respiratory failure with worsening bilateral lung infiltrates -At baseline on 4L [place] with Saturations 90-93% per patient  History of stage IV lung cancer Recurrent immunotherapy related pneumonitis Differential diagnosis at this point includes pneumonia versus immunotherapy related pneumonitis or progression of lung cancer Plan Titrate Supplemental Oxygen for Saturation Goal > 90 Continue Solu-Medrol (Currently on 40 mg daily)  Restarted antibiotics due to desaturation with worsening chest x-ray Follow-up on biopsy results.  H/O Pulmonary embolism Plan Continue Xarelto  Best practice:  Diet: PO DVT prophylaxis: on home a/c Glucose control: Monitor Mobility: Bed Code Status: Full Family Communication: Patient updated Disposition: Progressive care unit.   Signature:   Marshell Garfinkel MD  [facility] Please see [email or web address] for pager details.  10/06/2019, 6:52 PM

## 2019-10-07 ENCOUNTER — Other Ambulatory Visit: Payer: Self-pay | Admitting: Internal Medicine

## 2019-10-07 ENCOUNTER — Telehealth: Payer: Self-pay | Admitting: *Deleted

## 2019-10-07 ENCOUNTER — Encounter: Payer: Self-pay | Admitting: Internal Medicine

## 2019-10-07 DIAGNOSIS — C3491 Malignant neoplasm of unspecified part of right bronchus or lung: Secondary | ICD-10-CM

## 2019-10-07 LAB — CBC
HCT: 40.6 % (ref 36.0–46.0)
Hemoglobin: 12.5 g/dL (ref 12.0–15.0)
MCH: 31.6 pg (ref 26.0–34.0)
MCHC: 30.8 g/dL (ref 30.0–36.0)
MCV: 102.8 fL — ABNORMAL HIGH (ref 80.0–100.0)
Platelets: 166 10*3/uL (ref 150–400)
RBC: 3.95 MIL/uL (ref 3.87–5.11)
RDW: 16 % — ABNORMAL HIGH (ref 11.5–15.5)
WBC: 13.7 10*3/uL — ABNORMAL HIGH (ref 4.0–10.5)
nRBC: 0 % (ref 0.0–0.2)

## 2019-10-07 LAB — CULTURE, RESPIRATORY W GRAM STAIN
Culture: NORMAL
Culture: NORMAL

## 2019-10-07 LAB — CREATININE, SERUM
Creatinine, Ser: 1.05 mg/dL — ABNORMAL HIGH (ref 0.44–1.00)
GFR calc Af Amer: 60 mL/min — ABNORMAL LOW (ref >60)
GFR calc non Af Amer: 52 mL/min — ABNORMAL LOW (ref >60)

## 2019-10-07 LAB — SURGICAL PATHOLOGY

## 2019-10-07 NOTE — Progress Notes (Signed)
DIAGNOSIS:  1) Stage IV (T3, N0, M1 a)non-small cell lung cancer, well-differentiated adenocarcinoma presented with multifocal disease involving mainly the right upper lobe as well as the right middle lobe and left lower lobe of the lung diagnosed in May 2019. 2) bilateral small pulmonary emboli diagnosed incidentally on CT scan of the chest on October 16, 2017  Guardant 360 testing was negative for actionable mutations.  PRIOR THERAPY:None  CURRENT THERAPY: 1) Carboplatin for an AUC of 5, Alimta 500 mg/m2 and Keytruda 200 mg IV given every 3 weeks. First dosegiven on 08/18/2017.Status post 22cycles.  Starting from cycle #5 the patient is on maintenance Alimta and Ketruda (pembrolizumab) every 3 weeks.  Her treatment has been on hold for several months. She started treatment with single agent Keytruda every 3 weeks on February 12, 2019. 2) Xarelto 15 mg p.o. twice daily for the first 3 weeks followed by 20 mg p.o. Daily.  Subjective: The patient is seen and examined today.  Her husband was at the bedside.  The patient continues to complain of shortness of breath and she is currently on more than 10 L of oxygen nasal cannula.  She denied having any current fever or chills.  She has no nausea, vomiting, diarrhea or constipation.  She was admitted for the shortness of breath.  She had bronchoscopy performed recently and the final pathology was consistent with adenocarcinoma.  Objective: Vital signs in last 24 hours: Temp:  [97.8 F (36.6 C)-98.6 F (37 C)] 98.3 F (36.8 C) (07/26 1342) Pulse Rate:  [64-78] 78 (07/26 0849) Resp:  [14-17] 15 (07/26 0554) BP: (133-156)/(56-64) 156/62 (07/26 1342) SpO2:  [91 %-98 %] 95 % (07/26 0754)  Intake/Output from previous day: 07/25 0701 - 07/26 0700 In: 1224.9 [P.O.:820; IV Piggyback:404.9] Out: 1300 [Urine:1300] Intake/Output this shift: No intake/output data recorded.  General appearance: alert, cooperative, fatigued and mild  distress Resp: rales bilaterally Cardio: regular rate and rhythm, S1, S2 normal, no murmur, click, rub or gallop GI: soft, non-tender; bowel sounds normal; no masses,  no organomegaly Extremities: extremities normal, atraumatic, no cyanosis or edema  Lab Results:  Recent Labs    10/06/19 0626 10/07/19 0444  WBC 21.0* 13.7*  HGB 13.6 12.5  HCT 45.2 40.6  PLT 177 166   BMET Recent Labs    10/07/19 0444  CREATININE 1.05*    Studies/Results: DG CHEST PORT 1 VIEW  Result Date: 10/06/2019 CLINICAL DATA:  Respiratory distress, oxygen desaturation EXAM: PORTABLE CHEST 1 VIEW COMPARISON:  10/05/2019 FINDINGS: Slight interval increase in heterogeneous airspace opacity of the right lung, particularly in the right upper lobe. Unchanged heterogeneous opacity elsewhere, with small, layering bilateral pleural effusions. Right chest port catheter. Heart and mediastinum are unremarkable. IMPRESSION: 1. Slight interval increase in heterogeneous airspace opacity of the right lung, particularly in the right upper lobe, consistent with worsened edema and/or infection. 2. Unchanged heterogeneous opacity elsewhere, with small, layering bilateral pleural effusions. Electronically Signed   By: Eddie Candle M.D.   On: 10/06/2019 10:09    Medications: I have reviewed the patient's current medications.  CODE STATUS: Still full code.  Assessment/Plan: This is a very pleasant 79 years old white female diagnosed with a stage IV non-small cell lung cancer, well differentiated adenocarcinoma in May 2019.  She is status post several chemotherapy regimens including induction carboplatin, Alimta and Keytruda.  The chemotherapy portion was discontinued secondary to intolerance and the patient continued further treatment with single agent Keytruda every 3 weeks since December 2020. She  has been in the hospital several times recently with recurrent shortness of breath and significant oxygen desaturation. Repeat imaging  study showed progressive airspace disease and repeat bronchoscopy was consistent with adenocarcinoma of the lung.  These findings are highly suspicious for progressive disease on top of immunotherapy mediated pneumonitis and pneumonia. I had a lengthy discussion with the patient and her husband today about her current disease stage, prognosis and treatment options. I explained to the patient her poor prognosis especially with the recent progressive nature of her disease.  I gave the patient the option of palliative care and hospice referral versus consideration of treatment with systemic chemotherapy probably with single agent Alimta.  She had good response to this treatment in the past but it was associated with a lot of fatigue. The patient is not interested in the hospice and palliative care option at this point.  She would like to try other treatment first.  I also gave her the option of referral to [facility] for a second opinion regarding her condition and she is interested in this option.  I will arrange for her to have a visit with Dr. Durenda Hurt at [facility] after discharge. I will arrange for the patient to have a follow-up appointment with me after discharge for more detailed discussion of her chemotherapy option. For the questionable pneumonia, she will continue her current treatment with Zosyn and vancomycin. For the suspicious immunotherapy mediated pneumonitis, continue the current treatment with prednisone. For the hypothyroidism she is currently on levothyroxine. For the history of pulmonary embolus, the patient will continue her current treatment with Xarelto. Thank you for taking good care of Ms. Daidone, please call if you have any questions.    LOS: 7 days    Eilleen Kempf 10/07/2019

## 2019-10-07 NOTE — Evaluation (Signed)
Physical Therapy Evaluation Patient Details Name: Erin Myers MRN: [id number] DOB: [birth year] Today's Date: 10/07/2019   History of Present Illness  Patient is 79 y.o. female with PMH including lung cancer and chemotherapy, interstitial lung disease, COPD, chronic hypoxic respiratory failure on 4 L presented with shortness of breath.  Admitted for acute on chronic hypoxic respiratory failure secondary to pneumonia superimposed on interstitial lung disease and lung cancer.    Clinical Impression  Erin Myers is 79 y.o. female admitted with above HPI and diagnosis. Patient is currently limited by functional impairments below (see PT problem list). Patient lives with her husband and is independent at baseline. Patient is currently requiring 11L/min via HFNC to maintain saturations in 90's. She amb short distance in room on 10L/min and sats remained 92-100% throughout with momentary drop to 88% and pt recovered with standing rest. Patient will benefit from continued skilled PT interventions to address impairments and progress independence with mobility, recommending HHPT. Acute PT will follow and progress as able.     Follow Up Recommendations Home health PT    Equipment Recommendations  None recommended by PT    Recommendations for Other Services       Precautions / Restrictions Precautions Precautions: Fall Precaution Comments: watch O2 Restrictions Weight Bearing Restrictions: No      Mobility  Bed Mobility          General bed mobility comments: pt OOB in recliner at start, eneded session sitting EOB to eat.  Transfers Overall transfer level: Needs assistance Equipment used: None Transfers: Sit to/from Stand Sit to Stand: Min assist;Min guard         General transfer comment: cues for technique to rise, with UE's for power up. min assist to rise without UE use for stength testing and min guard for safety to steady with single UE use for power  up.  Ambulation/Gait Ambulation/Gait assistance: Min Web designer (Feet): 35 Feet Assistive device: IV Pole Gait Pattern/deviations: Step-through pattern;Decreased step length - right;Decreased step length - left;Decreased stride length;Shuffle Gait velocity: decr   General Gait Details: cues for safe hand placement on IV pole. 1 episode of LOB and min assist to steady. Pt on 10L/min via [place] for O2 and SpO2 remained in 92-100% during gait with one drop to 88% requiring standing rest break and pursed lip breathing to recover. pt reports DOE scale of 2/4 with gait.  Stairs       Wheelchair Mobility    Modified Rankin (Stroke Patients Only)       Balance Overall balance assessment: Mild deficits observed, not formally tested;Needs assistance Sitting-balance support: Feet supported Sitting balance-Leahy Scale: Good     Standing balance support: During functional activity;Single extremity supported Standing balance-Leahy Scale: Fair          Pertinent Vitals/Pain Pain Assessment: No/denies pain    Home Living Family/patient expects to be discharged to:: Private residence Living Arrangements: Spouse/significant other Available Help at Discharge: Family Type of Home: House Home Access: Stairs to enter Entrance Stairs-Rails: Right Entrance Stairs-Number of Steps: 3-4 at garage Home Layout: Two level;Able to live on main level with bedroom/bathroom;Full bath on main level Home Equipment: Cane - single point      Prior Function Level of Independence: Independent         Comments: pt on 4L/min at home     Hand Dominance   Dominant Hand: Right    Extremity/Trunk Assessment   Upper Extremity Assessment Upper Extremity Assessment: Overall [facility]  for tasks assessed    Lower Extremity Assessment Lower Extremity Assessment: Generalized weakness    Cervical / Trunk Assessment Cervical / Trunk Assessment: Normal  Communication   Communication: No  difficulties  Cognition Arousal/Alertness: Awake/alert Behavior During Therapy: WFL for tasks assessed/performed Overall Cognitive Status: Within Functional Limits for tasks assessed               General Comments General comments (skin integrity, edema, etc.): 5x Sit<>Stand test: 24.9 seconds with single UE use for power up and min guard for safety. pt unable to complete rise with no UE use.    Exercises     Assessment/Plan    PT Assessment Patient needs continued PT services  PT Problem List Decreased strength;Decreased activity tolerance;Decreased balance;Decreased mobility;Decreased knowledge of use of DME       PT Treatment Interventions DME instruction;Stair training;Gait training;Functional mobility training;Therapeutic activities;Therapeutic exercise;Balance training;Patient/family education    PT Goals (Current goals can be found in the Care Plan section)  Acute Rehab PT Goals Patient Stated Goal: keep fighting PT Goal Formulation: With patient Time For Goal Achievement: 10/21/19 Potential to Achieve Goals: Good    Frequency Min 3X/week    AM-PAC PT "6 Clicks" Mobility  Outcome Measure Help needed turning from your back to your side while in a flat bed without using bedrails?: A Little Help needed moving from lying on your back to sitting on the side of a flat bed without using bedrails?: A Little Help needed moving to and from a bed to a chair (including a wheelchair)?: A Little Help needed standing up from a chair using your arms (e.g., wheelchair or bedside chair)?: A Little Help needed to walk in hospital room?: A Little Help needed climbing 3-5 steps with a railing? : A Little 6 Click Score: 18    End of Session   Activity Tolerance: Patient tolerated treatment well Patient left: in bed;with call bell/phone within reach Nurse Communication: Mobility status PT Visit Diagnosis: Muscle weakness (generalized) (M62.81);Difficulty in walking, not elsewhere  classified (R26.2)    Time: 5379-4327 PT Time Calculation (min) (ACUTE ONLY): 34 min   Charges:   PT Evaluation $PT Eval Moderate Complexity: 1 Mod PT Treatments $Gait Training: 8-22 mins       Verner Mould, DPT Acute Rehabilitation Services  Office [phone] Pager [phone]  10/07/2019 6:41 PM

## 2019-10-07 NOTE — Progress Notes (Addendum)
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  PCCM consulted for help with management  History of present illness   Lung cancer diagnosed in May 2019, treated with systemic chemotherapy and Alimta, Keytruda. She developed interstitial pneumonitis secondary to [facility] earlier this year treated with prednisone and Keytruda therapy was on hold. She has received several rounds of prednisone recently for immunotherapy related pneumonitis  Past Medical History    has a past medical history of Anxiety, Diabetes mellitus without complication ([place]), Dyslipidemia, HTN (hypertension), Hyperlipemia, Hypothyroid, Hypothyroidism, Lung cancer ([place]) (2000), Obesity, Ovarian cancer ([place]), Palpitations (2009 and 2006), and Sleep apnea.  Significant Hospital Events   7/19 admit 7/23 bronchoscopy 7/25 transfer to PCU for worsening oxygen requirement  Consults:  PCCM  Procedures:    Significant Diagnostic Tests:  CT Chest 09/30/2019-increasing bilateral lower lobe consolidation with nodular appearing areas in the upper lobe.  Micro Data:  Blood culture 7/19 >>  U strep pneumo 10/02/19 >> negative U legionella 7/21 >> Pending  Antimicrobials:  Ceftriaxone 7/19 >> Azithromycin 7/19 >>   Interim history/subjective:  Less SOB than yesterday, but still far from baseline.  On 10L [place]  Concerned about DOE, getting around at home from bed to chair to bathroom.   Path from [facility]OB 7/23 c/w adenocarcinoma RLL (previously known site).  LLL bx pending   Objective   Blood pressure (!) 137/56, pulse 78, temperature 97.8 F (36.6 C),  temperature source Oral, resp. rate 15, height 5\' 5"  (1.651 m), weight 81.6 kg, SpO2 95 %.        Intake/Output Summary (Last 24 hours) at 10/07/2019 1137 Last data filed at 10/07/2019 0600 Gross per 24 hour  Intake 984.9 ml  Output 1000 ml  Net -15.1 ml   Filed Weights   09/30/19 1226 10/04/19 1546 10/05/19 1557  Weight: 76.2 kg 76.2 kg 81.6 kg    Examination: Gen:      No acute distress HEENT:  EOMI, sclera anicteric Neck:     No masses; no thyromegaly Lungs:    Scattered rhonchi, normal respiratory effort CV:         Regular rate and rhythm; no murmurs Abd:      + bowel sounds; soft, non-tender; no palpable masses, no distension Ext:    No edema; adequate peripheral perfusion Skin:      Warm and dry; no rash Neuro: alert and oriented x 3 Psych: normal mood and affect  Chest x-ray 7/25-slight increase in airspace opacities in the right lung.  Unchanged baseline bilateral infiltrates.  Resolved Hospital Problem list     Assessment & Plan:   Acute on Chronic hypoxic respiratory failure with worsening bilateral lung infiltrates -At baseline on 4L [place] with Saturations 90-93% per patient  History of stage IV lung cancer Recurrent immunotherapy related pneumonitis Differential diagnosis at this point includes pneumonia versus immunotherapy related pneumonitis and likely progression of lung cancer Plan Titrate Supplemental Oxygen for Saturation Goal > 90 Continue Solu-Medrol (Currently on 40 mg daily)  Continue abx  Pulmonary hygiene, mobilize. Will add PT Oncology following Awaiting further path results   H/O Pulmonary embolism Plan Continue Xarelto  Best practice:  Diet: PO DVT prophylaxis:  on home a/c Glucose control: Monitor Mobility: Bed Code Status: Full Family Communication: Patient and husband updated at length 7/26  Disposition: Progressive care unit.   Signature:   Nickolas Madrid, NP Pulmonary/Critical Care Medicine  10/07/2019  11:37 AM

## 2019-10-07 NOTE — Telephone Encounter (Signed)
Referral faxed to Dr. Aniceto Boss @ [facility]

## 2019-10-07 NOTE — Progress Notes (Signed)
PROGRESS NOTE  Erin Myers RSW:[id number] DOB: 07/03/1940 DOA: 09/30/2019 PCP: [name], MD  Brief History   79 year old woman PMH including lung cancer and chemotherapy, interstitial lung disease, COPD, chronic hypoxic respiratory failure on 4 L presented with shortness of breath.  Admitted for acute on chronic hypoxic respiratory failure secondary to pneumonia superimposed on interstitial lung disease and lung cancer.  A & P  Acute on chronic hypoxic resp failure (on 4L [place] at home), secondary to multilobar pneumonia, superimposed on ILD and lung cancer --finished abx 7/24 and was transferred to med-surg, 7/26 AM w/ sig increased oxygen requirement and transferred to progressive and IV abx started.  --better today but still on HFNC at 11 --continue steroids. --some concern for progression of malignancy. I have asked Dr. [name] to see and make recommendations.  AKI --Resolved.  No further evaluation suggested.  Lung cancer  --Followed by Dr. Earlie Server   PMH VTE --continue Xarelto    Hypothyroidism --continue levothyroxine  Aortic atherosclerosis --Continue Crestor  Disposition Plan:  Discussion: somewhat better but still has high oxygen requirement. Continue management as per pulmonology and will f/u oncology recommendations. Resp status too unstable and oxygen requirement too high to discharge.  Status is: Inpatient  Remains inpatient appropriate because:Inpatient level of care appropriate due to severity of illness  Dispo: The patient is from: Home              Anticipated d/c is to: Home              Anticipated d/c date is: 3 days              Patient currently is not medically stable to d/c.  DVT prophylaxis:  rivaroxaban (XARELTO) tablet 20 mg  Code Status: Full Family Communication: none  Murray Hodgkins, MD  [facility] Direct contact: see [email or web address] (further directions at bottom of note if needed) 7PM-7AM contact night coverage as at  bottom of note 10/07/2019, 3:10 PM  LOS: 7 days    Significant Hospital Events   7/19 admit  Consults:  PCCM Oncology   Procedures:  7/23 bronchoscopy: unresolving bilateral infiltrates  Significant Diagnostic Tests:  CT Chest 09/30/2019-increasing bilateral lower lobe consolidation with nodular appearing areas in the upper lobe.  Micro Data:  Blood culture 7/19 >> NG/F Pleural fluid 7/23 > NG/F  Antimicrobials:  Ceftriaxone 7/19 >> 7/24 Azithromycin 7/19 >> 7/24 Vanc 7/25 > Zosyn 7/25 >  Interval History/Subjective  Much better today; slept; used CPAP. No coughing.  Objective   Vitals:  Vitals:   10/07/19 0947 10/07/19 1342  BP:  (!) 156/62  Pulse:    Resp:    Temp: 97.8 F (36.6 C) 98.3 F (36.8 C)  SpO2:      Exam: Constitutional:   . Appears calm and comfortable Respiratory:  . Bilateral coarse breath sounds, no frank w/r/r.  . Respiratory effort mildly increasd.  Cardiovascular:  . RRR, no m/r/g . No LE extremity edema   Psychiatric:  . Mental status o Mood, affect appropriate  I have personally reviewed the following:   Today's Data  . WBC down to 13.7  Scheduled Meds: . amLODipine  5 mg Oral Daily  . Budeson-Glycopyrrol-Formoterol  2 puff Oral BID  . Chlorhexidine Gluconate Cloth  6 each Topical Daily  . clorazepate  3.75 mg Oral BID  . docusate sodium  100 mg Oral BID  . ipratropium-albuterol  3 mL Nebulization TID  . levothyroxine  112 mcg Oral Daily  .  mouth rinse  15 mL Mouth Rinse BID  . metoprolol succinate  50 mg Oral BID  . predniSONE  40 mg Oral Q breakfast  . rivaroxaban  20 mg Oral Q breakfast  . rosuvastatin  10 mg Oral QPM  . sodium chloride flush  10-40 mL Intracatheter Q12H  . valACYclovir  500 mg Oral Daily   Continuous Infusions: . piperacillin-tazobactam (ZOSYN)  IV 3.375 g (10/07/19 0534)  . vancomycin 1,000 mg (10/07/19 1327)    Principal Problem:   Acute on chronic respiratory failure with hypoxia  (HCC) Active Problems:   OSA on CPAP   Essential hypertension   Dyslipidemia, goal LDL below 130   Family history of coronary artery disease   Hx of malignant carcinoid tumor   Sepsis ([place])   Adenocarcinoma of right lung, stage 4 (HCC)   Pneumonitis   Pneumonitis, interstitial (HCC)   AKI (acute kidney injury) ([place])   Lobar pneumonia ([place])   Aortic atherosclerosis ([place])   LOS: 7 days   How to contact the [facility] Attending or Consulting provider [place] or covering provider during after hours [place], for this patient?  1. Check the care team in [facility] and look for a) attending/consulting TRH provider listed and b) the [facility] team listed 2. Log into [email or web address] and use [place]'s universal password to access. If you do not have the password, please contact the hospital operator. 3. Locate the [facility] provider you are looking for under [facility] and page to a number that you can be directly reached. 4. If you still have difficulty reaching the provider, please page the [facility] (Director on Call) for the Hospitalists listed on amion for assistance.

## 2019-10-08 DIAGNOSIS — J9621 Acute and chronic respiratory failure with hypoxia: Secondary | ICD-10-CM | POA: Diagnosis not present

## 2019-10-08 LAB — ACID FAST SMEAR (AFB, MYCOBACTERIA)
Acid Fast Smear: NEGATIVE
Acid Fast Smear: NEGATIVE

## 2019-10-08 LAB — CREATININE, SERUM
Creatinine, Ser: 1.28 mg/dL — ABNORMAL HIGH (ref 0.44–1.00)
GFR calc Af Amer: 47 mL/min — ABNORMAL LOW (ref >60)
GFR calc non Af Amer: 41 mL/min — ABNORMAL LOW (ref >60)

## 2019-10-08 LAB — CBC
HCT: 37 % (ref 36.0–46.0)
Hemoglobin: 11.3 g/dL — ABNORMAL LOW (ref 12.0–15.0)
MCH: 31.8 pg (ref 26.0–34.0)
MCHC: 30.5 g/dL (ref 30.0–36.0)
MCV: 104.2 fL — ABNORMAL HIGH (ref 80.0–100.0)
Platelets: 159 10*3/uL (ref 150–400)
RBC: 3.55 MIL/uL — ABNORMAL LOW (ref 3.87–5.11)
RDW: 16 % — ABNORMAL HIGH (ref 11.5–15.5)
WBC: 12.3 10*3/uL — ABNORMAL HIGH (ref 4.0–10.5)
nRBC: 0 % (ref 0.0–0.2)

## 2019-10-08 LAB — CYTOLOGY - NON PAP

## 2019-10-08 MED ORDER — SODIUM CHLORIDE 0.9 % IV SOLN
2.0000 g | Freq: Two times a day (BID) | INTRAVENOUS | Status: DC
  Administered 2019-10-08 – 2019-10-09 (×2): 2 g via INTRAVENOUS
  Filled 2019-10-08 (×4): qty 2

## 2019-10-08 MED ORDER — METRONIDAZOLE IN NACL 5-0.79 MG/ML-% IV SOLN
500.0000 mg | Freq: Three times a day (TID) | INTRAVENOUS | Status: DC
  Administered 2019-10-08 – 2019-10-09 (×3): 500 mg via INTRAVENOUS
  Filled 2019-10-08 (×3): qty 100

## 2019-10-08 NOTE — Progress Notes (Signed)
PROGRESS NOTE  Erin Myers ZOX:[id number] DOB: 1940/12/31 DOA: 09/30/2019 PCP: [name], MD  Brief History   79 year old woman PMH including lung cancer and chemotherapy, interstitial lung disease, COPD, chronic hypoxic respiratory failure on 4 L presented with shortness of breath.  Admitted for acute on chronic hypoxic respiratory failure secondary to pneumonia superimposed on interstitial lung disease and lung cancer.  Treated with antibiotics, steroids, bronchodilators.  Seen by oncology and pulmonology.  Underwent bronchoscopy with results still pending but concerning for malignancy.  Completed a course of antibiotics and was transferred to the floor with significant improvement and return to baseline respiratory status.  However subsequently decompensated requiring transfer back to progressive care.  Continues to have significantly high oxygen requirement at 10 L, no opportunity to discharge until oxygen requirement back at baseline.  A & P  Acute on chronic hypoxic resp failure (on 4L [place] at home), secondary to multilobar pneumonia, superimposed on ILD and lung cancer --finished abx 7/24 and was transferred to med-surg, 7/26 AM w/ sig increased oxygen requirement and transferred to progressive and IV abx started.  --Appears better today but still requiring 10 L high flow nasal cannula --Continue steroids.  Continue IV antibiotics plan DC Zosyn and replaced with cefepime and Flagyl given renal insufficiency. --Appreciate pulmonology involvement.  Per oncology there is concern for progression of malignancy.  Chemotherapy is being considered.  Second opinion being considered.  AKI --Resolved.  No further evaluation suggested.  Lung cancer  --Followed by Dr. Earlie Server   PMH VTE --Continue Xarelto    Hypothyroidism --continue levothyroxine  Aortic atherosclerosis --Continue Crestor  Disposition Plan:  Discussion: Appears better on exam but still requiring 10 L high flow  nasal cannula.  Continue current treatments.  Status is: Inpatient  Remains inpatient appropriate because:Inpatient level of care appropriate due to severity of illness  Dispo: The patient is from: Home              Anticipated d/c is to: Home              Anticipated d/c date is: 3 days              Patient currently is not medically stable to d/c.  DVT prophylaxis:  rivaroxaban (XARELTO) tablet 20 mg  Code Status: Full Family Communication: Husband at bedside  Murray Hodgkins, MD  [facility] Direct contact: see [email or web address] (further directions at bottom of note if needed) 7PM-7AM contact night coverage as at bottom of note 10/08/2019, 6:51 PM  LOS: 8 days    Significant Hospital Events   7/19 admit  Consults:  PCCM Oncology   Procedures:  7/23 bronchoscopy: unresolving bilateral infiltrates  Significant Diagnostic Tests:  CT Chest 09/30/2019-increasing bilateral lower lobe consolidation with nodular appearing areas in the upper lobe.  Micro Data:  Blood culture 7/19 >> NG/F Pleural fluid 7/23 > NG/F  Antimicrobials:  Ceftriaxone 7/19 >> 7/24 Azithromycin 7/19 >> 7/24 Vanc 7/25 > Zosyn 7/25 > 7/27 Cefepime 7/27 > Flagyl 7/27 >  Interval History/Subjective  Much better today; slept; used CPAP. No coughing.  Objective   Vitals:  Vitals:   10/08/19 1000 10/08/19 1304  BP: (!) 143/50 (!) 144/57  Pulse:    Resp:    Temp: 97.8 F (36.6 C) 98.1 F (36.7 C)  SpO2: (!) 89%     Exam: Constitutional:   . Appears calm and comfortable, better today Respiratory:  . Coarse breath sounds bilaterally but better air movement . Respiratory effort mildly increased  Cardiovascular:  . RRR, no m/r/g . No LE extremity edema   Psychiatric:  . Mental status o Mood, affect appropriate  I have personally reviewed the following:   Today's Data  . Creatinine up to 1.28 . CBC stable  Scheduled Meds: . amLODipine  5 mg Oral Daily  .  Budeson-Glycopyrrol-Formoterol  2 puff Oral BID  . Chlorhexidine Gluconate Cloth  6 each Topical Daily  . clorazepate  3.75 mg Oral BID  . docusate sodium  100 mg Oral BID  . ipratropium-albuterol  3 mL Nebulization TID  . levothyroxine  112 mcg Oral Daily  . mouth rinse  15 mL Mouth Rinse BID  . metoprolol succinate  50 mg Oral BID  . predniSONE  40 mg Oral Q breakfast  . rivaroxaban  20 mg Oral Q breakfast  . rosuvastatin  10 mg Oral QPM  . sodium chloride flush  10-40 mL Intracatheter Q12H  . valACYclovir  500 mg Oral Daily   Continuous Infusions: . ceFEPime (MAXIPIME) IV Stopped (10/08/19 1355)  . metronidazole Stopped (10/08/19 1551)  . vancomycin Stopped (10/08/19 1311)    Principal Problem:   Acute on chronic respiratory failure with hypoxia (HCC) Active Problems:   OSA on CPAP   Essential hypertension   Dyslipidemia, goal LDL below 130   Family history of coronary artery disease   Hx of malignant carcinoid tumor   Sepsis ([place])   Adenocarcinoma of right lung, stage 4 (HCC)   Pneumonitis   Pneumonitis, interstitial (HCC)   AKI (acute kidney injury) ([place])   Lobar pneumonia ([place])   Aortic atherosclerosis ([place])   LOS: 8 days   How to contact the [facility] Attending or Consulting provider [place] or covering provider during after hours [place], for this patient?  1. Check the care team in [facility] and look for a) attending/consulting TRH provider listed and b) the [facility] team listed 2. Log into [email or web address] and use [place]'s universal password to access. If you do not have the password, please contact the hospital operator. 3. Locate the [facility] provider you are looking for under [facility] and page to a number that you can be directly reached. 4. If you still have difficulty reaching the provider, please page the [facility] (Director on Call) for the Hospitalists listed on amion for assistance.

## 2019-10-08 NOTE — Progress Notes (Signed)
   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  PCCM consulted for help with management  History of present illness   Lung cancer diagnosed in May 2019, treated with systemic chemotherapy and Alimta, Keytruda. She developed interstitial pneumonitis secondary to [facility] earlier this year treated with prednisone and Keytruda therapy was on hold. She has received several rounds of prednisone recently for immunotherapy related pneumonitis  Past Medical History    has a past medical history of Anxiety, Diabetes mellitus without complication ([place]), Dyslipidemia, HTN (hypertension), Hyperlipemia, Hypothyroid, Hypothyroidism, Lung cancer ([place]) (2000), Obesity, Ovarian cancer ([place]), Palpitations (2009 and 2006), and Sleep apnea.  Significant Hospital Events   7/19 admit 7/23 bronchoscopy  Consults:  PCCM  Procedures:    Significant Diagnostic Tests:  CT Chest 09/30/2019-increasing bilateral lower lobe consolidation with nodular appearing areas in the upper lobe.  Path from FOB 7/23 c/w adenocarcinoma in bilateral lungs  Micro Data:  Blood culture 7/19 >>  U strep pneumo 10/02/19 >> negative U legionella 7/21 >> Pending  Antimicrobials:  Ceftriaxone 7/19 >> Azithromycin 7/19 >>   Interim history/subjective:   Continues to have dyspnea, on 10 L high flow  Objective   Blood pressure (!) 145/63, pulse 71, temperature 97.9 F (36.6 C), temperature source Oral, resp. rate 21, height 5\' 5"  (1.651 m), weight 81.6 kg, SpO2 (!) 89 %.        Intake/Output Summary (Last 24 hours) at 10/08/2019 1944 Last data filed at  10/08/2019 1830 Gross per 24 hour  Intake 867.02 ml  Output 1000 ml  Net -132.98 ml   Filed Weights   09/30/19 1226 10/04/19 1546 10/05/19 1557  Weight: 76.2 kg 76.2 kg 81.6 kg    Examination: Gen:      No acute distress HEENT:  EOMI, sclera anicteric Neck:     No masses; no thyromegaly Lungs:    Clear to auscultation bilaterally; normal respiratory effort CV:         Regular rate and rhythm; no murmurs Abd:      + bowel sounds; soft, non-tender; no palpable masses, no distension Ext:    No edema; adequate peripheral perfusion Skin:      Warm and dry; no rash Neuro: alert and oriented x 3 Psych: normal mood and affect   Resolved Hospital Problem list     Assessment & Plan:  Acute on chronic respiratory failure, bilateral infiltrates Being treated for pneumonia, pneumonitis with antibiotics and steroids.  Will need slow taper of prednisone over the next 1 month Biopsy from the [place] shows adenocarcinoma in bilateral lungs.  Agree with Dr. [name] that presentation is likely due to progressive cancer She has been referred to [facility] for second opinion   H/O Pulmonary embolism Plan Continue Xarelto   PCCM will be available as needed during this admission.  Please call with any questions  Best practice:  Diet: PO DVT prophylaxis: on home a/c Glucose control: Monitor Mobility: Bed Code Status: Full Family Communication: Patient and husband updated at length Disposition: Progressive care unit.   Signature:   Marshell Garfinkel MD [facility] Please see [email or web address] for pager details.  10/08/2019, 7:50 PM

## 2019-10-08 NOTE — Anesthesia Postprocedure Evaluation (Signed)
Anesthesia Post Note  Patient: Erin Myers  Procedure(s) Performed: VIDEO BRONCHOSCOPY WITH FLUORO (N/A ) BRONCHIAL BRUSHINGS BRONCHIAL BIOPSIES     Patient location during evaluation: Endoscopy Anesthesia Type: General Level of consciousness: awake and alert Pain management: pain level controlled Vital Signs Assessment: post-procedure vital signs reviewed and stable Respiratory status: spontaneous breathing, nonlabored ventilation, respiratory function stable and patient connected to nasal cannula oxygen Cardiovascular status: blood pressure returned to baseline and stable Postop Assessment: no apparent nausea or vomiting Anesthetic complications: no   No complications documented.  Last Vitals:  Vitals:   10/08/19 1000 10/08/19 1304  BP: (!) 143/50 (!) 144/57  Pulse:    Resp:    Temp: 36.6 C 36.7 C  SpO2: (!) 89%     Last Pain:  Vitals:   10/08/19 1304  TempSrc: Oral  PainSc:    Pain Goal:                   Lidia Collum

## 2019-10-08 NOTE — Care Management Important Message (Signed)
Important Message  Patient Details IM Letter presented to the Patient Name: Erin Myers MRN: [id number] Date of Birth: [birth year]   Medicare Important Message Given:  Yes     Kerin Salen 10/08/2019, 3:17 PM

## 2019-10-09 ENCOUNTER — Inpatient Hospital Stay: Payer: PPO | Admitting: Physician Assistant

## 2019-10-09 ENCOUNTER — Inpatient Hospital Stay: Payer: PPO

## 2019-10-09 ENCOUNTER — Telehealth: Payer: Self-pay | Admitting: *Deleted

## 2019-10-09 DIAGNOSIS — Z7189 Other specified counseling: Secondary | ICD-10-CM

## 2019-10-09 DIAGNOSIS — Z515 Encounter for palliative care: Secondary | ICD-10-CM

## 2019-10-09 LAB — CBC
HCT: 39.4 % (ref 36.0–46.0)
Hemoglobin: 11.6 g/dL — ABNORMAL LOW (ref 12.0–15.0)
MCH: 31.2 pg (ref 26.0–34.0)
MCHC: 29.4 g/dL — ABNORMAL LOW (ref 30.0–36.0)
MCV: 105.9 fL — ABNORMAL HIGH (ref 80.0–100.0)
Platelets: 154 10*3/uL (ref 150–400)
RBC: 3.72 MIL/uL — ABNORMAL LOW (ref 3.87–5.11)
RDW: 15.9 % — ABNORMAL HIGH (ref 11.5–15.5)
WBC: 13.7 10*3/uL — ABNORMAL HIGH (ref 4.0–10.5)
nRBC: 0 % (ref 0.0–0.2)

## 2019-10-09 NOTE — Progress Notes (Signed)
Physical Therapy Treatment Patient Details Name: Erin Myers MRN: [id number] DOB: [birth year] Today's Date: 10/09/2019    History of Present Illness Patient is 79 y.o. female with PMH including lung cancer and chemotherapy, interstitial lung disease, COPD, chronic hypoxic respiratory failure on 4 L presented with shortness of breath.  Admitted for acute on chronic hypoxic respiratory failure secondary to pneumonia superimposed on interstitial lung disease and lung cancer.    PT Comments    Patient progressing well with mobility. Amb greater distance today on 10L/min and maintained sats in 90s. Patient encouraged to continue use of capella for improved sats at rest. She will continue to benefit from skilled PT interventions to progress mobility and continue to recommend HHPT for follow up. Aute PT will progress as able.   Follow Up Recommendations  Home health PT     Equipment Recommendations  None recommended by PT    Recommendations for Other Services       Precautions / Restrictions Precautions Precautions: Fall Precaution Comments: watch O2 Restrictions Weight Bearing Restrictions: No    Mobility  Bed Mobility Overal bed mobility: Needs Assistance Bed Mobility: Supine to Sit     Supine to sit: HOB elevated;Min assist     General bed mobility comments: Min assist to raise trunk fully and cues to use bed rail to assist.   Transfers Overall transfer level: Needs assistance Equipment used: None Transfers: Sit to/from Stand Sit to Stand: Min guard         General transfer comment: pt using 1UE for power up to rise from EOB, no min guard for safety. pt reaching for IV pole to steady self in standing.  Ambulation/Gait Ambulation/Gait assistance: Min Web designer (Feet): 160 Feet Assistive device: IV Pole Gait Pattern/deviations: Step-through pattern;Decreased step length - right;Decreased step length - left;Decreased stride length;Shuffle Gait  velocity: decr   General Gait Details: pt with good technique using IV pole to staedy self during gait, no overt LOB noted. Pt on 10L.min and SpO2 maintained in 90's. Pt reports 3/4 on DOE scale.    Stairs             Wheelchair Mobility    Modified Rankin (Stroke Patients Only)       Balance Overall balance assessment: Mild deficits observed, not formally tested;Needs assistance Sitting-balance support: Feet supported Sitting balance-Leahy Scale: Good     Standing balance support: During functional activity;Single extremity supported Standing balance-Leahy Scale: Fair                              Cognition Arousal/Alertness: Awake/alert Behavior During Therapy: WFL for tasks assessed/performed Overall Cognitive Status: Within Functional Limits for tasks assessed                   Exercises      General Comments General comments (skin integrity, edema, etc.): Educated to complete 10x acapella to improve sats.      Pertinent Vitals/Pain Pain Assessment: No/denies pain           PT Goals (current goals can now be found in the care plan section) Acute Rehab PT Goals PT Goal Formulation: With patient Time For Goal Achievement: 10/21/19 Potential to Achieve Goals: Good Progress towards PT goals: Progressing toward goals    Frequency    Min 3X/week      PT Plan Current plan remains appropriate       AM-PAC PT "6 Clicks" Mobility  Outcome Measure  Help needed turning from your back to your side while in a flat bed without using bedrails?: A Little Help needed moving from lying on your back to sitting on the side of a flat bed without using bedrails?: A Little Help needed moving to and from a bed to a chair (including a wheelchair)?: A Little Help needed standing up from a chair using your arms (e.g., wheelchair or bedside chair)?: A Little Help needed to walk in hospital room?: A Little Help needed climbing 3-5 steps with a  railing? : A Little 6 Click Score: 18    End of Session Equipment Utilized During Treatment: Gait belt Activity Tolerance: Patient tolerated treatment well Patient left: in bed;with call bell/phone within reach Nurse Communication: Mobility status;Other (comment) (pt on 10L/min sats between 90-93%) PT Visit Diagnosis: Muscle weakness (generalized) (M62.81);Difficulty in walking, not elsewhere classified (R26.2)     Time: 1801-1830 PT Time Calculation (min) (ACUTE ONLY): 29 min  Charges:  $Gait Training: 8-22 mins $Therapeutic Activity: 8-22 mins                     Verner Mould, DPT Acute Rehabilitation Services  Office [phone] Pager [phone]  10/09/2019 7:53 PM

## 2019-10-09 NOTE — Progress Notes (Signed)
PROGRESS NOTE    Erin Myers  GLO:[id number] DOB: 06-03-1940 DOA: 09/30/2019 PCP: [name], MD    Chief Complaint  Patient presents with  . Shortness of Breath    Brief Narrative:  79 year old lady with prior history of lung cancer, completed 10 cycles of chemo, did not tolerate Keytruda, interstitial lung disease, COPD, chronic respiratory failure on 4 L of nasal cannula oxygen presents with shortness of breath.  Oncology and pulmonologist consulted she completed 10 days of IV antibiotics and currently on steroids and bronchodilators.  She underwent bronchoscopy which showed bilateral adenocarcinoma of the lungs.  She continues to require high flow oxygen at 10 L at this time.  Assessment & Plan:   Principal Problem:   Acute on chronic respiratory failure with hypoxia (HCC) Active Problems:   OSA on CPAP   Essential hypertension   Dyslipidemia, goal LDL below 130   Family history of coronary artery disease   Hx of malignant carcinoid tumor   Sepsis ([place])   Adenocarcinoma of right lung, stage 4 (HCC)   Pneumonitis   Pneumonitis, interstitial (HCC)   AKI (acute kidney injury) ([place])   Lobar pneumonia (HCC)   Aortic atherosclerosis (HCC)   Acute on chronic respiratory failure with hypoxia probably secondary to progression of the lung cancer with superimposed interstitial lung disease.  Completed course of 10 days of IV antibiotics. Continue with steroids Appreciate pulmonology assistance.  Plan for chemotherapy outpatient by Dr. [name] once we were able to wean her down to 4 L of nasal cannula oxygen .    Hypothyroidism Continue with Synthroid.   AKI Resolved.    Continue with bronchodilators as needed.    Atherosclerosis/hyperlipidemia Continue with Crestor   Essential hypertension Continue with 50 mg twice daily and Norvasc.   Obstructive sleep apnea on CPAP   DVT prophylaxis: Lovenox Code Status: Full code Family Communication:  Family at bedside Disposition:   Status is: Inpatient  Remains inpatient appropriate because:Hemodynamically unstable   Dispo: The patient is from: Home              Anticipated d/c is to: Home              Anticipated d/c date is: 2 days              Patient currently is not medically stable to d/c.       Consultants:   Oncology  PCCM  Procedures: None Antimicrobials: None  Subjective: Patient would like to get treatment and ultimately wants to go home.  Objective: Vitals:   10/09/19 0734 10/09/19 0950 10/09/19 1234 10/09/19 1534  BP:  (!) 157/53    Pulse:  74 69   Resp:  17 16   Temp:  (!) 97.5 F (36.4 C) 97.9 F (36.6 C)   TempSrc:  Oral Oral   SpO2: 91%   95%  Weight:      Height:        Intake/Output Summary (Last 24 hours) at 10/09/2019 1728 Last data filed at 10/09/2019 0950 Gross per 24 hour  Intake 540 ml  Output 1000 ml  Net -460 ml   Filed Weights   09/30/19 1226 10/04/19 1546 10/05/19 1557  Weight: 76.2 kg 76.2 kg 81.6 kg    Examination:  General exam: Mild distress from shortness of breath on 10 L of high flow nasal cannula oxygen. Respiratory system: Bilateral rhonchi with scattered Rales heard.  Tachypnea present diminished air entry at bases Cardiovascular system: S1 &  S2 heard, RRR. No JVD,. No pedal edema. Gastrointestinal system: Abdomen is nondistended, soft and nontender. No organomegaly or masses felt. Normal bowel sounds heard. Central nervous system: Alert and oriented. No focal neurological deficits. Extremities: Symmetric 5 x 5 power. Skin: No rashes, lesions or ulcers Psychiatry:  Mood & affect appropriate.     Data Reviewed: I have personally reviewed following labs and imaging studies  CBC: Recent Labs  Lab 10/05/19 0500 10/06/19 0626 10/07/19 0444 10/08/19 0444 10/09/19 0525  WBC 19.2* 21.0* 13.7* 12.3* 13.7*  HGB 13.3 13.6 12.5 11.3* 11.6*  HCT 43.0 45.2 40.6 37.0 39.4  MCV 102.4* 103.9* 102.8* 104.2*  105.9*  PLT 187 177 166 159 517    Basic Metabolic Panel: Recent Labs  Lab 10/03/19 0332 10/04/19 0503 10/07/19 0444 10/08/19 0934  NA 144 143  --   --   K 4.5 4.4  --   --   CL 106 105  --   --   CO2 29 30  --   --   GLUCOSE 158* 118*  --   --   BUN 37* 34*  --   --   CREATININE 1.12* 1.06* 1.05* 1.28*  CALCIUM 8.5* 8.7*  --   --   MG 2.1 2.2  --   --   PHOS 3.9 3.8  --   --     GFR: Estimated Creatinine Clearance: 39.4 mL/min (A) (by C-G formula based on SCr of 1.28 mg/dL (H)).  Liver Function Tests: No results for input(s): AST, ALT, ALKPHOS, BILITOT, PROT, ALBUMIN in the last 168 hours.  CBG: No results for input(s): GLUCAP in the last 168 hours.   Recent Results (from the past 240 hour(s))  Culture, blood (single) w Reflex to ID Panel     Status: None   Collection Time: 09/30/19  1:27 PM   Specimen: BLOOD  Result Value Ref Range Status   Specimen Description   Final    BLOOD RIGHT ANTECUBITAL Performed at [facility] [address], [place], [place] [address]    Special Requests   Final    BOTTLES DRAWN AEROBIC AND ANAEROBIC Blood Culture adequate volume Performed at [facility] [address] [place], [place] [address]    Culture   Final    NO GROWTH 5 DAYS Performed at [facility], [place] [address] [place], [place] [address]    Report Status 10/05/2019 FINAL  Final  SARS Coronavirus 2 by RT PCR (hospital order, performed in [facility] hospital lab) Nasopharyngeal Nasopharyngeal Swab     Status: None   Collection Time: 09/30/19  3:10 PM   Specimen: Nasopharyngeal Swab  Result Value Ref Range Status   SARS Coronavirus 2 NEGATIVE NEGATIVE Final    Comment: (NOTE) SARS-CoV-2 target nucleic acids are NOT DETECTED.  The SARS-CoV-2 RNA is generally detectable in upper and lower respiratory specimens during the acute phase of infection. The lowest concentration of SARS-CoV-2 viral copies this assay can  detect is 250 copies / mL. A negative result does not preclude SARS-CoV-2 infection and should not be used as the sole basis for treatment or other patient management decisions.  A negative result may occur with improper specimen collection / handling, submission of specimen other than nasopharyngeal swab, presence of viral mutation(s) within the areas targeted by this assay, and inadequate number of viral copies (<250 copies / mL). A negative result must be combined with clinical observations, patient history, and epidemiological information.  Fact Sheet for Patients:   StrictlyIdeas.no  Fact Sheet for Healthcare Providers: BankingDealers.co.za  This test is not yet approved or  cleared by the [place] FDA and has been authorized for detection and/or diagnosis of SARS-CoV-2 by FDA under an Emergency Use Authorization (EUA).  This EUA will remain in effect (meaning this test can be used) for the duration of the COVID-19 declaration under Section 564(b)(1) of the Act, 21 U.S.C. section 360bbb-3(b)(1), unless the authorization is terminated or revoked sooner.  Performed at [facility], [place] [address] [place], [place] [address]   MRSA PCR Screening     Status: None   Collection Time: 10/01/19  5:30 PM   Specimen: Nasopharyngeal  Result Value Ref Range Status   MRSA by PCR NEGATIVE NEGATIVE Final    Comment:        The GeneXpert MRSA Assay (FDA approved for NASAL specimens only), is one component of a comprehensive MRSA colonization surveillance program. It is not intended to diagnose MRSA infection nor to guide or monitor treatment for MRSA infections. Performed at [facility], [place] [address], [place], [place] [address]   Culture, respiratory     Status: None   Collection Time: 10/04/19  2:45 PM   Specimen: Bronchial Alveolar Lavage; Respiratory  Result Value Ref Range Status    Specimen Description   Final    BRONCHIAL ALVEOLAR LAVAGE RLL Performed at [facility] [address] [place], [place] [address]    Special Requests   Final    NONE Performed at [facility], [place] [address], [place], [place] [address]    Gram Stain   Final    ABUNDANT WBC PRESENT, PREDOMINANTLY PMN NO ORGANISMS SEEN    Culture   Final    Consistent with normal respiratory flora. Performed at [facility], [place] [address], [place], [place] [address]    Report Status 10/07/2019 FINAL  Final  Culture, respiratory     Status: None   Collection Time: 10/04/19  2:45 PM   Specimen: Bronchial Alveolar Lavage; Respiratory  Result Value Ref Range Status   Specimen Description   Final    BRONCHIAL ALVEOLAR LAVAGE LLL Performed at [facility] [address] [place], [place] [address]    Special Requests   Final    NONE Performed at [facility], [place] [address] [place],  [address]    Gram Stain   Final    MODERATE WBC PRESENT, PREDOMINANTLY PMN NO ORGANISMS SEEN    Culture   Final    Consistent with normal respiratory flora. Performed at [facility], [place] [address], [place],  [address]    Report Status 10/07/2019 FINAL  Final  Acid Fast Smear (AFB)     Status: None   Collection Time: 10/04/19  2:45 PM   Specimen: Bronchial Alveolar Lavage; Respiratory  Result Value Ref Range Status   AFB Specimen Processing Concentration  Final   Acid Fast Smear Negative  Final    Comment: (NOTE) Performed At: [facility] [place], [place] [id number] Rush Farmer MD NU:[id number]    Source (AFB) BRONCHIAL ALVEOLAR LAVAGE  Final    Comment: RLL Performed at [facility] [address], [place], [place] [address]   Acid Fast Smear (AFB)     Status: None   Collection Time: 10/04/19  2:45 PM   Specimen: Bronchial Alveolar Lavage; Respiratory  Result Value Ref Range Status   AFB Specimen Processing Concentration  Final   Acid Fast Smear Negative  Final    Comment: (NOTE) Performed At: [facility] [place], [place] [id number] Rush Farmer MD OY:[id number]    Source (AFB) BRONCHIAL ALVEOLAR LAVAGE  Final    Comment: LLL Performed at [facility] [address], [place], [place] [address]          Radiology Studies: No results found.      Scheduled Meds: . amLODipine  5 mg Oral Daily  . Budeson-Glycopyrrol-Formoterol  2 puff Oral BID  . Chlorhexidine Gluconate Cloth  6 each Topical Daily  . clorazepate  3.75 mg Oral BID  . docusate sodium  100 mg Oral BID  . ipratropium-albuterol  3 mL Nebulization TID  . levothyroxine  112 mcg Oral Daily  . mouth rinse  15 mL Mouth Rinse BID  . metoprolol succinate  50 mg Oral BID  . predniSONE  40 mg Oral Q breakfast  . rivaroxaban  20 mg Oral Q breakfast  . rosuvastatin  10 mg Oral QPM  . sodium chloride flush  10-40 mL Intracatheter Q12H  . valACYclovir  500 mg Oral Daily   Continuous Infusions:   LOS: 9 days        Hosie Poisson, MD [facility]   To contact the attending provider between 7A-7P or the covering provider during after hours 7P-7A, please log into the web site [email or web address] and access using universal [place] password for that web site. If you do not have the password, please call the hospital operator.  10/09/2019, 5:28 PM

## 2019-10-09 NOTE — Consult Note (Signed)
Consultation Note Date: 10/09/2019   Patient Name: Erin Myers  DOB: Jun 17, 1940  MRN: [id number]  Age / Sex: 79 y.o., female  PCP: [name], MD Referring Physician: Hosie Poisson, MD  Reason for Consultation: Establishing goals of care  HPI/Patient Profile: 79 y.o. female  with past medical history of stage IV non-small cell lung cancer, interstitial pneumonitis, PE, HTN, HLD, diabetes, hypothyroidism, h/o ovarian cancer, sleep apnea, anxiety. Also with lung cancer (diagnosis May 2019) undergoing chemotherapy with Dr. [name], developed interstitial pneumonitis secondary to [facility] early 2021 treated with prednisone and Keytruda on hold. Admitted on 09/30/2019 with worsening shortness of breath. S/P bronchoscopy 7/23 showing adenocarcinoma of bilateral lungs. Ongoing antibiotic for multilobar pneumonia and steroid treatment but poor progression and concern that worsening is secondary to progressive cancer.   Clinical Assessment and Goals of Care: I met today with Seth Bake. She is a very pleasant lady and in good spirits. Currently she continues with high flow oxygen requirements which is a barrier to discharge at this time.   Kashlyn shares with me that she was a flight attendant for many years. She has been married to her husband for >40 years. She has a son, daughter, grandchildren and great grandchildren. Throughout our conversation it is evident that her family is her pride and joy and she desires to have her life prolonged as long as possible to have every possible second with them that she is able. She also has a strong faith and this brings her strength and encouragement throughout her illness and now. With her faith she does tell me that she is not ready to die or leave her family but she does know that when this does happen that she knows she will be in heaven and views this as a "win-win  situation." She does consider to have a good quality of life as long as she can have meaningful interactions/conversations with her family.   She does share with me that she and her husband have had some of the more difficult conversations and they have planned out their funeral and arrangements. We did discuss that along with these plans I would encourage her to discuss with him some of the what ifs and scenarios of what may occur with her health and make sure that her family is aware of her wishes in these scenarios in the event that they are faced with making these decisions on her behalf. She agrees that this is something that is important to discuss and I encouraged that a Living Will can help start these conversations. She will consider.  All questions/concerns addressed. Emotional support provided. I will follow up again tomorrow.   Primary Decision Maker PATIENT    SUMMARY OF RECOMMENDATIONS   - Ongoing conversations regarding goals of care. - Hopeful for improvement so she can continue treatment and have more time with family.   Code Status/Advance Care Planning:  Full code   Symptom Management:   Per attending.   Palliative Prophylaxis:   Frequent Pain Assessment, Oral Care  and Turn Reposition  Additional Recommendations (Limitations, Scope, Preferences):  Full Scope Treatment  Prognosis:   Overall prognosis poor with advanced cancer.   Discharge Planning: To Be Determined      Primary Diagnoses: Present on Admission: . Essential hypertension . Dyslipidemia, goal LDL below 130 . Sepsis ([place]) . Adenocarcinoma of right lung, stage 4 ([place]) . Pneumonitis . Pneumonitis, interstitial ([place]) . Acute on chronic respiratory failure with hypoxia ([place])   I have reviewed the medical record, interviewed the patient and family, and examined the patient. The following aspects are pertinent.  Past Medical History:  Diagnosis Date  . Anxiety   . Diabetes mellitus  without complication ([place])   . Dyslipidemia   . HTN (hypertension)   . Hyperlipemia   . Hypothyroid   . Hypothyroidism   . Lung cancer ([place]) 2000   Carcinoid, s/p RLL lobectomy  . Obesity   . Ovarian cancer ([place])   . Palpitations 2009 and 2006   Negative nuclear stress test   . Sleep apnea    on C-pap   Social History   Socioeconomic History  . Marital status: Married    Spouse name: Not on file  . Number of children: 2  . Years of education: HS  . Highest education level: Not on file  Occupational History  . Not on file  Tobacco Use  . Smoking status: Former Smoker    Packs/day: 0.75    Years: 30.00    Pack years: 22.50    Types: Cigarettes    Quit date: 08/18/1991    Years since quitting: 28.1  . Smokeless tobacco: Never Used  Substance and Sexual Activity  . Alcohol use: No    Alcohol/week: 0.0 standard drinks  . Drug use: No  . Sexual activity: Yes    Partners: Male  Other Topics Concern  . Not on file  Social History Narrative   1-2 glasses of tea a day    Married mother of 2 (daughter close to 71 and son 45-68 years old).  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   Social Determinants of Health   Financial Resource Strain:   . Difficulty of Paying Living Expenses:   Food Insecurity:   . Worried About Charity fundraiser in the Last Year:   . Arboriculturist in the Last Year:   Transportation Needs:   . Film/video editor (Medical):   [name] Lack of Transportation (Non-Medical):   Physical Activity:   . Days of Exercise per Week:   . Minutes of Exercise per Session:   Stress:   . Feeling of Stress :   Social Connections:   . Frequency of Communication with Friends and Family:   . Frequency of Social Gatherings with Friends and Family:   . Attends Religious Services:   . Active Member of Clubs or Organizations:   . Attends Archivist Meetings:   [name] Marital Status:    Family  History  Problem Relation Age of Onset  . Coronary artery disease Mother 70       died of an MI  . Heart attack Mother   . Suicidality Sister   . CVA Brother 30  . CVA Father    Scheduled Meds: . amLODipine  5 mg Oral Daily  . Budeson-Glycopyrrol-Formoterol  2 puff Oral BID  . Chlorhexidine Gluconate Cloth  6 each Topical Daily  . clorazepate  3.75  mg Oral BID  . docusate sodium  100 mg Oral BID  . ipratropium-albuterol  3 mL Nebulization TID  . levothyroxine  112 mcg Oral Daily  . mouth rinse  15 mL Mouth Rinse BID  . metoprolol succinate  50 mg Oral BID  . predniSONE  40 mg Oral Q breakfast  . rivaroxaban  20 mg Oral Q breakfast  . rosuvastatin  10 mg Oral QPM  . sodium chloride flush  10-40 mL Intracatheter Q12H  . valACYclovir  500 mg Oral Daily   Continuous Infusions: PRN Meds:.acetaminophen **OR** acetaminophen, albuterol, guaiFENesin-dextromethorphan, hydrALAZINE, sodium chloride flush Allergies  Allergen Reactions  . Pembrolizumab Anaphylaxis  . Adhesive [Tape] Other (See Comments)    Irritates skin   . Codeine Other (See Comments)    Skin crawlinf feeling  . Lactose Intolerance (Gi) Diarrhea  . Monosodium Glutamate Diarrhea  . Sulfa Antibiotics Nausea And Vomiting   Review of Systems  Constitutional: Positive for fatigue. Negative for activity change and appetite change.  Respiratory: Positive for shortness of breath.   Neurological: Positive for weakness.    Physical Exam Vitals and nursing note reviewed.  Constitutional:      General: She is not in acute distress.    Appearance: She is ill-appearing.  Cardiovascular:     Rate and Rhythm: Normal rate.  Pulmonary:     Effort: No tachypnea, accessory muscle usage or respiratory distress.     Comments: Tachypnea and cough with thin secretion expectoration during conversation. More short of breath at times during conversation.  Abdominal:     General: Abdomen is flat.  Neurological:     Mental Status:  She is alert and oriented to person, place, and time.     Vital Signs: BP (!) 157/53 (BP Location: Right Arm)   Pulse 69   Temp 97.9 F (36.6 C) (Oral)   Resp 16   Ht 5' 5"  (1.651 m)   Wt 81.6 kg   SpO2 95%   BMI 29.95 kg/m  Pain Scale: 0-10   Pain Score: 0-No pain   SpO2: SpO2: 95 % O2 Device:SpO2: 95 % O2 Flow Rate: .O2 Flow Rate (L/min): 11 L/min  IO: Intake/output summary:   Intake/Output Summary (Last 24 hours) at 10/09/2019 1537 Last data filed at 10/09/2019 0950 Gross per 24 hour  Intake 540 ml  Output 1000 ml  Net -460 ml    LBM: Last BM Date: 10/06/19 Baseline Weight: Weight: 76.2 kg Most recent weight: Weight: 81.6 kg     Palliative Assessment/Data:     Time In: 1615 Time Out: 1730 Time Total: 75 min Greater than 50%  of this time was spent counseling and coordinating care related to the above assessment and plan.  Signed by: Vinie Sill, NP Palliative Medicine Team Pager # [phone] (M-F 8a-5p) Team Phone # [phone] (Nights/Weekends)

## 2019-10-09 NOTE — Telephone Encounter (Signed)
Guardant 360 results faxed to Neoma Laming at [place]

## 2019-10-09 NOTE — Telephone Encounter (Signed)
Received call from Sam at [facility] stating pt insurance can not be taken at facility. Pt will be rerouted to an in network facility

## 2019-10-10 DIAGNOSIS — Z515 Encounter for palliative care: Secondary | ICD-10-CM

## 2019-10-10 LAB — CBC
HCT: 37.8 % (ref 36.0–46.0)
Hemoglobin: 11.5 g/dL — ABNORMAL LOW (ref 12.0–15.0)
MCH: 32 pg (ref 26.0–34.0)
MCHC: 30.4 g/dL (ref 30.0–36.0)
MCV: 105.3 fL — ABNORMAL HIGH (ref 80.0–100.0)
Platelets: 163 10*3/uL (ref 150–400)
RBC: 3.59 MIL/uL — ABNORMAL LOW (ref 3.87–5.11)
RDW: 15.9 % — ABNORMAL HIGH (ref 11.5–15.5)
WBC: 14.4 10*3/uL — ABNORMAL HIGH (ref 4.0–10.5)
nRBC: 0 % (ref 0.0–0.2)

## 2019-10-10 NOTE — Progress Notes (Signed)
Pt. placed on CPAP, tolerating well.

## 2019-10-10 NOTE — TOC Progression Note (Addendum)
Transition of Care [facility]) - Progression Note    Patient Details  Name: Erin Myers MRN: [id number] Date of Birth: 06/27/1940  Transition of Care [facility]) CM/SW Contact  Marciano Mundt, Juliann Pulse, RN Phone Number: 10/10/2019, 12:52 PM  Clinical Narrative:will need qualifying 02 sats, & home 02 order to send to [place] or call them(they have epic access to pull orders)  Confirmed patient uses [facility] for home 02-rep Anna-they will deliver travel tank to rm @ d/c-patient will need it.-Friday or Saturday. Will monitor if higher home 02 requirement needed-await home 02 order to fax to [place].      Expected Discharge Plan: Home/Self Care Barriers to Discharge: Continued Medical Work up  Expected Discharge Plan and Services Expected Discharge Plan: Home/Self Care   Discharge Planning Services: CM Consult   Living arrangements for the past 2 months: Single Family Home                                       Social Determinants of Health (SDOH) Interventions    Readmission Risk Interventions No flowsheet data found.

## 2019-10-10 NOTE — Progress Notes (Signed)
PROGRESS NOTE    Erin Myers  WJX:[id number] DOB: 11-Mar-1941 DOA: 09/30/2019 PCP: [name], MD    Chief Complaint  Patient presents with  . Shortness of Breath    Brief Narrative:  79 year old lady with prior history of lung cancer, completed 10 cycles of chemo, did not tolerate Keytruda, interstitial lung disease, COPD, chronic respiratory failure on 4 L of nasal cannula oxygen presents with shortness of breath.  Oncology and pulmonologist consulted she completed 10 days of IV antibiotics and currently on steroids and bronchodilators.  She underwent bronchoscopy which showed bilateral adenocarcinoma of the lungs.  She initially required 10 lit of HF [place] oxygen, started weaning her oxygen. Currently she is at 8 lit /min. When we can wean her to 4 to 6l it of [place] oxygen, will plan for discharge home   Assessment & Plan:   Principal Problem:   Acute on chronic respiratory failure with hypoxia (HCC) Active Problems:   OSA on CPAP   Essential hypertension   Dyslipidemia, goal LDL below 130   Family history of coronary artery disease   Hx of malignant carcinoid tumor   Sepsis ([place])   Adenocarcinoma of right lung, stage 4 (HCC)   Pneumonitis   Pneumonitis, interstitial (HCC)   AKI (acute kidney injury) ([place])   Lobar pneumonia ([place])   Aortic atherosclerosis ([place])   Palliative care by specialist   Acute on chronic respiratory failure with hypoxia probably secondary to progression of the lung cancer with superimposed interstitial lung disease.  Completed course of 10 days of IV antibiotics. Continue with steroids, duonebs and supportive management.  Appreciate pulmonology assistance.  Plan for chemotherapy outpatient by Dr. [name] once we were able to wean her down to 4 to 6 L/min of nasal cannula oxygen and when she is more stable .  PT evaluation recommending home health PT which will be ordered on discharge.    Hypothyroidism Continue with  Synthroid.   AKI Resolved   Atherosclerosis/hyperlipidemia Continue with Crestor   Essential hypertension Well controlled.  Continue with 50 mg twice daily and Norvasc.   Obstructive sleep apnea on CPAP  Mild anemia of chronic disease:  Hemoglobin stable around 11.    Stage 3 b CKD Creatinine ranging from 1.1 to 1.5.  Continue to monitor.    DVT prophylaxis: Lovenox Code Status: Full code Family Communication: none at bedside.  Disposition:   Status is: Inpatient  Remains inpatient appropriate because:Hemodynamically unstable   Dispo: The patient is from: Home              Anticipated d/c is to: Home              Anticipated d/c date is: 2 days              Patient currently is not medically stable to d/c.       Consultants:   Oncology  PCCM  Procedures: None Antimicrobials: None  Subjective: Pt reports she feels the same, .  Still coughing and sob, but we were able to wean her oxygen down to 8 lit/min of [place] oxygen.  No chest pain or nausea, vomiting or abd pain.  2 BM today.   Objective: Vitals:   10/10/19 0814 10/10/19 1008 10/10/19 1307 10/10/19 1517  BP:   (!) 138/49   Pulse:  64 74   Resp:   18   Temp:   97.9 F (36.6 C)   TempSrc:   Oral   SpO2: 91%  93%  93%  Weight:      Height:        Intake/Output Summary (Last 24 hours) at 10/10/2019 1744 Last data filed at 10/10/2019 1300 Gross per 24 hour  Intake 480 ml  Output 600 ml  Net -120 ml   Filed Weights   09/30/19 1226 10/04/19 1546 10/05/19 1557  Weight: 76.2 kg 76.2 kg 81.6 kg    Examination:  General exam: mild distress from sob on 8 lt/min of [place] oxygen.  Respiratory system: bilateral rhonchi, diminished at bases, no wheezing heard.  Cardiovascular system: S1S2 RRR, no JVD, no pedal edema.  Gastrointestinal system: abdomen is soft, non tender non distended bowel sounds wnl.  Central nervous system: alert and oriented, non focal.  Extremities: no pedal edema.  Skin:  no rashes seen.  Psychiatry:  Mood is appropriate.    Data Reviewed: I have personally reviewed following labs and imaging studies  CBC: Recent Labs  Lab 10/06/19 0626 10/07/19 0444 10/08/19 0444 10/09/19 0525 10/10/19 0354  WBC 21.0* 13.7* 12.3* 13.7* 14.4*  HGB 13.6 12.5 11.3* 11.6* 11.5*  HCT 45.2 40.6 37.0 39.4 37.8  MCV 103.9* 102.8* 104.2* 105.9* 105.3*  PLT 177 166 159 154 017    Basic Metabolic Panel: Recent Labs  Lab 10/04/19 0503 10/07/19 0444 10/08/19 0934  NA 143  --   --   K 4.4  --   --   CL 105  --   --   CO2 30  --   --   GLUCOSE 118*  --   --   BUN 34*  --   --   CREATININE 1.06* 1.05* 1.28*  CALCIUM 8.7*  --   --   MG 2.2  --   --   PHOS 3.8  --   --     GFR: Estimated Creatinine Clearance: 39.4 mL/min (A) (by C-G formula based on SCr of 1.28 mg/dL (H)).  Liver Function Tests: No results for input(s): AST, ALT, ALKPHOS, BILITOT, PROT, ALBUMIN in the last 168 hours.  CBG: No results for input(s): GLUCAP in the last 168 hours.   Recent Results (from the past 240 hour(s))  MRSA PCR Screening     Status: None   Collection Time: 10/01/19  5:30 PM   Specimen: Nasopharyngeal  Result Value Ref Range Status   MRSA by PCR NEGATIVE NEGATIVE Final    Comment:        The GeneXpert MRSA Assay (FDA approved for NASAL specimens only), is one component of a comprehensive MRSA colonization surveillance program. It is not intended to diagnose MRSA infection nor to guide or monitor treatment for MRSA infections. Performed at [facility], [place] [address] [place], [place] [address]   Culture, respiratory     Status: None   Collection Time: 10/04/19  2:45 PM   Specimen: Bronchial Alveolar Lavage; Respiratory  Result Value Ref Range Status   Specimen Description   Final    BRONCHIAL ALVEOLAR LAVAGE RLL Performed at [place] [address], [place], [place] [address]    Special Requests   Final     NONE Performed at [facility], [place] [address], [place], [place] [address]    Gram Stain   Final    ABUNDANT WBC PRESENT, PREDOMINANTLY PMN NO ORGANISMS SEEN    Culture   Final    Consistent with normal respiratory flora. Performed at [facility], [place] [address] [place], [place] [address]  Report Status 10/07/2019 FINAL  Final  Culture, respiratory     Status: None   Collection Time: 10/04/19  2:45 PM   Specimen: Bronchial Alveolar Lavage; Respiratory  Result Value Ref Range Status   Specimen Description   Final    BRONCHIAL ALVEOLAR LAVAGE LLL Performed at [place] [address], [place], [place] [address]    Special Requests   Final    NONE Performed at [facility], [place] [address] [place], [place] [address]    Gram Stain   Final    MODERATE WBC PRESENT, PREDOMINANTLY PMN NO ORGANISMS SEEN    Culture   Final    Consistent with normal respiratory flora. Performed at [facility], [place] [address] [place], [place] [address]    Report Status 10/07/2019 FINAL  Final  Fungus Culture With Stain     Status: None (Preliminary result)   Collection Time: 10/04/19  2:45 PM   Specimen: Bronchial Alveolar Lavage  Result Value Ref Range Status   Fungus Stain Final report  Final    Comment: (NOTE) Performed At: [facility] [place], [place] [id number] Rush Farmer MD YK:[id number]    Fungus (Mycology) Culture PENDING  Incomplete   Fungal Source BRONCHIAL ALVEOLAR LAVAGE  Final    Comment: RLL Performed at [facility], [place] [address] [place], [place] [address]   Fungus Culture With Stain     Status: None (Preliminary result)   Collection Time: 10/04/19  2:45 PM   Specimen: Bronchial Alveolar Lavage  Result Value Ref Range Status   Fungus Stain Final report  Final    Comment: (NOTE) Performed At: [facility] [place], [place]  [id number] Rush Farmer MD IO:[id number]    Fungus (Mycology) Culture PENDING  Incomplete   Fungal Source BRONCHIAL ALVEOLAR LAVAGE  Final    Comment: LLL Performed at [facility], [place] [address], [place], [place] [address]   Acid Fast Smear (AFB)     Status: None   Collection Time: 10/04/19  2:45 PM   Specimen: Bronchial Alveolar Lavage; Respiratory  Result Value Ref Range Status   AFB Specimen Processing Concentration  Final   Acid Fast Smear Negative  Final    Comment: (NOTE) Performed At: [facility] [place], [place] [id number] Rush Farmer MD [id number]    Source (AFB) BRONCHIAL ALVEOLAR LAVAGE  Final    Comment: RLL Performed at [place] [address] [place], [place] [address]   Acid Fast Smear (AFB)     Status: None   Collection Time: 10/04/19  2:45 PM   Specimen: Bronchial Alveolar Lavage; Respiratory  Result Value Ref Range Status   AFB Specimen Processing Concentration  Final   Acid Fast Smear Negative  Final    Comment: (NOTE) Performed At: [facility] [place], [place] [id number] Rush Farmer MD YO:[id number]    Source (AFB) BRONCHIAL ALVEOLAR LAVAGE  Final    Comment: LLL Performed at [place] [address], [place], [place] [address]   Fungus Culture Result     Status: None   Collection Time: 10/04/19  2:45 PM  Result Value Ref Range Status   Result 1 Comment  Final    Comment: (NOTE) KOH/Calcofluor preparation:  no fungus observed. Performed At: [facility] [place], [place] [id number] Rush Farmer MD OB:[id number]   Fungus Culture Result  Status: None   Collection Time: 10/04/19  2:45 PM  Result Value Ref Range Status   Result 1 Comment  Final    Comment: (NOTE) KOH/Calcofluor preparation:  no fungus observed. Performed At: [facility] [place], [place]  [id number] Rush Farmer MD RF:[id number]          Radiology Studies: No results found.      Scheduled Meds: . amLODipine  5 mg Oral Daily  . Budeson-Glycopyrrol-Formoterol  2 puff Oral BID  . Chlorhexidine Gluconate Cloth  6 each Topical Daily  . clorazepate  3.75 mg Oral BID  . docusate sodium  100 mg Oral BID  . ipratropium-albuterol  3 mL Nebulization TID  . levothyroxine  112 mcg Oral Daily  . mouth rinse  15 mL Mouth Rinse BID  . metoprolol succinate  50 mg Oral BID  . predniSONE  40 mg Oral Q breakfast  . rivaroxaban  20 mg Oral Q breakfast  . rosuvastatin  10 mg Oral QPM  . sodium chloride flush  10-40 mL Intracatheter Q12H  . valACYclovir  500 mg Oral Daily   Continuous Infusions:   LOS: 10 days        Hosie Poisson, MD [facility]   To contact the attending provider between 7A-7P or the covering provider during after hours 7P-7A, please log into the web site [email or web address] and access using universal [place] password for that web site. If you do not have the password, please call the hospital operator.  10/10/2019, 5:44 PM

## 2019-10-11 NOTE — TOC Initial Note (Signed)
Transition of Care [facility]) - Initial/Assessment Note    Patient Details  Name: Erin Myers MRN: [id number] Date of Birth: 1941-02-09  [facility]) CM/SW Contact:    Dessa Phi, RN Phone Number: 10/11/2019, 1:35 PM  Clinical Narrative:  Spoke to spouse about d/c plans-d/c home;aleady has [place] home 02,rw. Contacted [employer] they can provid home 02 requirement up to 15l regulator/concentrator-portables only go up to 5l-will need sats, & home 02 order(open on weekends) just call they have access to epic-they can deliver home 02 tank to rm prior d/c. PT-recc HHPT-will check for [facility] agency to accept-patient has no preference.             Expected Discharge Plan: [place] Barriers to Discharge: Continued Medical Work up   Patient Goals and CMS Choice Patient states their goals for this hospitalization and ongoing recovery are:: go home CMS Medicare.gov Compare Post Acute Care list provided to:: Patient Represenative (must comment) (spouse) Choice offered to / list presented to : Spouse, Patient  Expected Discharge Plan and Services Expected Discharge Plan: [place]   Discharge Planning Services: CM Consult Post Acute Care Choice: [place] arrangements for the past 2 months: Single Family Home                                      Prior Living Arrangements/Services Living arrangements for the past 2 months: Single Family Home Lives with:: Spouse Patient language and need for interpreter reviewed:: Yes Do you feel safe going back to the place where you live?: Yes      Need for Family Participation in Patient Care: No (Comment) Care giver support system in place?: Yes (comment) Current home services: DME (rw,[employer] home 02) Criminal Activity/Legal Involvement Pertinent to Current Situation/Hospitalization: No - Comment as needed  Activities of Daily Living Home Assistive Devices/Equipment: CPAP,  Eyeglasses, Oxygen ADL Screening (condition at time of admission) Patient's cognitive ability adequate to safely complete daily activities?: Yes Is the patient deaf or have difficulty hearing?: No Does the patient have difficulty seeing, even when wearing glasses/contacts?: No Does the patient have difficulty concentrating, remembering, or making decisions?: No Patient able to express need for assistance with ADLs?: Yes Does the patient have difficulty dressing or bathing?: No Independently performs ADLs?: Yes (appropriate for developmental age) Does the patient have difficulty walking or climbing stairs?: Yes (secondary to shortness of breath) Weakness of Legs: None Weakness of Arms/Hands: None  Permission Sought/Granted Permission sought to share information with : Case Manager Permission granted to share information with : Yes, Verbal Permission Granted  Share Information with NAME: Case manager     Permission granted to share info w Relationship: [name] spouse [phone]     Emotional Assessment Appearance:: Appears stated age Attitude/Demeanor/Rapport: Gracious Affect (typically observed): Accepting Orientation: : Oriented to Self, Oriented to Place, Oriented to  Time, Oriented to Situation Alcohol / Substance Use: Not Applicable Psych Involvement: No (comment)  Admission diagnosis:  Acute respiratory failure with hypoxia (HCC) [J96.01] Acute hypoxemic respiratory failure (HCC) [J96.01] Patient Active Problem List   Diagnosis Date Noted   Palliative care by specialist    AKI (acute kidney injury) ([place]) 10/02/2019   Lobar pneumonia ([place]) 10/02/2019   Aortic atherosclerosis ([place]) 10/02/2019   Pneumonitis, interstitial ([place]) 09/19/2019   Pneumonitis 07/09/2019   Chronic hypoxemic respiratory failure ([place]) 05/30/2019  Recurrent right pleural effusion 06/28/2018   Antineoplastic chemotherapy induced anemia 05/16/2018   Bronchiectasis ([place]) 03/12/2018   Cough  11/10/2017   Bilateral pulmonary embolism ([place]) 10/19/2017   Port-A-Cath in place 08/18/2017   Encounter for antineoplastic immunotherapy 08/10/2017   Encounter for antineoplastic chemotherapy 08/10/2017   Adenocarcinoma of right lung, stage 4 ([place]) 07/31/2017   Goals of care, counseling/discussion 07/31/2017   Sinusitis 07/19/2017   Acute on chronic respiratory failure with hypoxia ([place]) 07/19/2017   Hypokalemia 07/19/2017   Sepsis ([place]) 07/18/2017   Abnormal CT of the chest 07/14/2017   Hypoxemia 07/03/2017   Community acquired pneumonia of right upper lobe of lung 05/30/2017   Dyspnea 05/02/2017   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   History of posttraumatic stress disorder (PTSD) 05/14/2015   Obesity (BMI 30-39.9) 08/20/2012   Family history of coronary artery disease 08/17/2012   Palpitations    OSA on CPAP    Essential hypertension    Dyslipidemia, goal LDL below 130    Hypothyroid    PCP:  [name], MD Pharmacy:   [facility] [place] [address] Phone: [phone] Fax: [phone]     Social Determinants of Health (SDOH) Interventions    Readmission Risk Interventions No flowsheet data found.

## 2019-10-11 NOTE — TOC Progression Note (Signed)
Transition of Care [facility]) - Progression Note    Patient Details  Name: Etola Mull MRN: [id number] Date of Birth: 1940-04-19  Transition of Care [facility]) CM/SW Contact  [place] Athanas, Juliann Pulse, RN Phone Number: 10/11/2019, 3:33 PM  Clinical Narrative: Please make sure home 02 order gets to [place] timely so they can deliver appropriate tanks to home before patient d/c from hospital.      Expected Discharge Plan: [place] Barriers to Discharge: Continued Medical Work up  Expected Discharge Plan and Services Expected Discharge Plan: [place]   Discharge Planning Services: CM Consult Post Acute Care Choice: [place] arrangements for the past 2 months: Single Family Home                           HH Arranged: PT [place]: [place] Date [place]: 10/11/19 Time HH Agency Contacted: [id number] Representative spoke with at [place]: [place] ([place]) Interventions    Readmission Risk Interventions No flowsheet data found.

## 2019-10-11 NOTE — Progress Notes (Signed)
PROGRESS NOTE    Erin Myers  ZOX:[id number] DOB: [birth year] DOA: 09/30/2019 PCP: [name], MD    Chief Complaint  Patient presents with  . Shortness of Breath    Brief Narrative:  79 year old lady with prior history of lung cancer, completed 10 cycles of chemo, did not tolerate Keytruda, interstitial lung disease, COPD, chronic respiratory failure on 4 L of nasal cannula oxygen presents with shortness of breath.  Oncology and pulmonologist consulted she completed 10 days of IV antibiotics and currently on steroids and bronchodilators.  She underwent bronchoscopy which showed bilateral adenocarcinoma of the lungs.  She initially required 10 lit of HF [place] oxygen, started weaning her oxygen.  This morning patient required about 9 L of nasal cannula oxygen.  We were able to wean her down to 8 L/min today. Assessment & Plan:   Principal Problem:   Acute on chronic respiratory failure with hypoxia (HCC) Active Problems:   OSA on CPAP   Essential hypertension   Dyslipidemia, goal LDL below 130   Family history of coronary artery disease   Hx of malignant carcinoid tumor   Sepsis ([place])   Adenocarcinoma of right lung, stage 4 (HCC)   Pneumonitis   Pneumonitis, interstitial (HCC)   AKI (acute kidney injury) ([place])   Lobar pneumonia ([place])   Aortic atherosclerosis ([place])   Palliative care by specialist   Acute on chronic respiratory failure with hypoxia probably secondary to progression of the lung cancer with superimposed interstitial lung disease.  Completed course of 10 days of IV antibiotics. Continue with steroids, duonebs and supportive management.  Appreciate pulmonology assistance.  Plan for chemotherapy outpatient by Dr. [name] once we were able to wean her down to 4 to 6 L/min of nasal cannula oxygen and when she is more stable .  Unable to wean her down to 4 to 6 L of nasal cannula at rest.  Continue to monitor PT evaluation recommending home health PT which will  be ordered on discharge.    Hypothyroidism Continue with Synthroid.   AKI Resolved   Hyperlipidemia Continue with Crestor   Essential hypertension Well-controlled Continue with 50 mg twice daily and Norvasc.   Obstructive sleep apnea on CPAP  Mild anemia of chronic disease:  Hemoglobin stable around 11.    Leukocytosis Probably reactive.  Versus from steroids.  Stage 3 b CKD Creatinine ranging from 1.1 to 1.5.  Continue to monitor.    H/o VTE:  Resume Xarelto.     DVT prophylaxis: Lovenox Code Status: Full code Family Communication: Family at bedside Disposition:   Status is: Inpatient  Remains inpatient appropriate because:Hemodynamically unstable   Dispo: The patient is from: Home              Anticipated d/c is to: Home              Anticipated d/c date is: 2 days              Patient currently is not medically stable to d/c.       Consultants:   Oncology  PCCM  Procedures: None Antimicrobials: None  Subjective: Patient currently requiring up to 9 L of nasal cannula oxygen.  But she denies any chest pain or shortness of breath when compared to yesterday.  No nausea vomiting or diarrhea. Objective: Vitals:   10/11/19 1248 10/11/19 1300 10/11/19 1500 10/11/19 1549  BP: (!) 129/50     Pulse: 67 70 73   Resp: 17 22 21  Temp: 98.3 F (36.8 C)     TempSrc: Oral     SpO2: 95% 93% 93% 90%  Weight:      Height:        Intake/Output Summary (Last 24 hours) at 10/11/2019 1723 Last data filed at 10/11/2019 1540 Gross per 24 hour  Intake 120 ml  Output 300 ml  Net -180 ml   Filed Weights   09/30/19 1226 10/04/19 1546 10/05/19 1557  Weight: 76.2 kg 76.2 kg 81.6 kg    Examination:  General exam: Mild distress from shortness of breath on 9 L of nasal cannula oxygen. Respiratory system: Bilateral rhonchi, diminished at bases, no wheeze, tachypnea present Cardiovascular system: S1-S2 heard, regular rate rhythm, no JVD, no pedal edema.   Gastrointestinal system: abdomen is soft, nontender, nondistended, bowel sounds normal Central nervous system: Alert and oriented, grossly nonfocal Extremities: No cyanosis or clubbing Skin: No rashes seen Psychiatry: Mood is appropriate   Data Reviewed: I have personally reviewed following labs and imaging studies  CBC: Recent Labs  Lab 10/06/19 0626 10/07/19 0444 10/08/19 0444 10/09/19 0525 10/10/19 0354  WBC 21.0* 13.7* 12.3* 13.7* 14.4*  HGB 13.6 12.5 11.3* 11.6* 11.5*  HCT 45.2 40.6 37.0 39.4 37.8  MCV 103.9* 102.8* 104.2* 105.9* 105.3*  PLT 177 166 159 154 678    Basic Metabolic Panel: Recent Labs  Lab 10/07/19 0444 10/08/19 0934  CREATININE 1.05* 1.28*    GFR: Estimated Creatinine Clearance: 39.4 mL/min (A) (by C-G formula based on SCr of 1.28 mg/dL (H)).  Liver Function Tests: No results for input(s): AST, ALT, ALKPHOS, BILITOT, PROT, ALBUMIN in the last 168 hours.  CBG: No results for input(s): GLUCAP in the last 168 hours.   Recent Results (from the past 240 hour(s))  MRSA PCR Screening     Status: None   Collection Time: 10/01/19  5:30 PM   Specimen: Nasopharyngeal  Result Value Ref Range Status   MRSA by PCR NEGATIVE NEGATIVE Final    Comment:        The GeneXpert MRSA Assay (FDA approved for NASAL specimens only), is one component of a comprehensive MRSA colonization surveillance program. It is not intended to diagnose MRSA infection nor to guide or monitor treatment for MRSA infections. Performed at [facility], [place] [address] [place], [place] [address]   Culture, respiratory     Status: None   Collection Time: 10/04/19  2:45 PM   Specimen: Bronchial Alveolar Lavage; Respiratory  Result Value Ref Range Status   Specimen Description   Final    BRONCHIAL ALVEOLAR LAVAGE RLL Performed at [place] [address] [place], [place] [address]    Special Requests   Final    NONE Performed at  [facility], [place] [address], [place], [place] [address]    Gram Stain   Final    ABUNDANT WBC PRESENT, PREDOMINANTLY PMN NO ORGANISMS SEEN    Culture   Final    Consistent with normal respiratory flora. Performed at [facility], [place] [address], [place], [place] [address]    Report Status 10/07/2019 FINAL  Final  Culture, respiratory     Status: None   Collection Time: 10/04/19  2:45 PM   Specimen: Bronchial Alveolar Lavage; Respiratory  Result Value Ref Range Status   Specimen Description   Final    BRONCHIAL ALVEOLAR LAVAGE LLL Performed at [place] [address], [place], [place] [address]  Special Requests   Final    NONE Performed at [facility], [place] [address], [place], [place] [address]    Gram Stain   Final    MODERATE WBC PRESENT, PREDOMINANTLY PMN NO ORGANISMS SEEN    Culture   Final    Consistent with normal respiratory flora. Performed at [facility], [place] [address], [place], [place] [address]    Report Status 10/07/2019 FINAL  Final  Fungus Culture With Stain     Status: None (Preliminary result)   Collection Time: 10/04/19  2:45 PM   Specimen: Bronchial Alveolar Lavage  Result Value Ref Range Status   Fungus Stain Final report  Final    Comment: (NOTE) Performed At: [facility] [place], [place] [id number] Rush Farmer MD [id number]    Fungus (Mycology) Culture PENDING  Incomplete   Fungal Source BRONCHIAL ALVEOLAR LAVAGE  Final    Comment: RLL Performed at [facility], [place] [address] [place], [place] [address]   Fungus Culture With Stain     Status: None (Preliminary result)   Collection Time: 10/04/19  2:45 PM   Specimen: Bronchial Alveolar Lavage  Result Value Ref Range Status   Fungus Stain Final report  Final    Comment: (NOTE) Performed At: [facility] [place], [place] [id number] Rush Farmer MD [id number]    Fungus (Mycology) Culture PENDING  Incomplete   Fungal Source BRONCHIAL ALVEOLAR LAVAGE  Final    Comment: LLL Performed at [facility], [place] [address], [place], [place] [address]   Acid Fast Smear (AFB)     Status: None   Collection Time: 10/04/19  2:45 PM   Specimen: Bronchial Alveolar Lavage; Respiratory  Result Value Ref Range Status   AFB Specimen Processing Concentration  Final   Acid Fast Smear Negative  Final    Comment: (NOTE) Performed At: [facility] [place], [place] [id number] Rush Farmer MD [id number]    Source (AFB) BRONCHIAL ALVEOLAR LAVAGE  Final    Comment: RLL Performed at [place] [address], [place], [place] [address]   Acid Fast Smear (AFB)     Status: None   Collection Time: 10/04/19  2:45 PM   Specimen: Bronchial Alveolar Lavage; Respiratory  Result Value Ref Range Status   AFB Specimen Processing Concentration  Final   Acid Fast Smear Negative  Final    Comment: (NOTE) Performed At: [facility] [place], [place] [id number] Rush Farmer MD [id number]    Source (AFB) BRONCHIAL ALVEOLAR LAVAGE  Final    Comment: LLL Performed at [place] [address], [place], [place] [address]   Fungus Culture Result     Status: None   Collection Time: 10/04/19  2:45 PM  Result Value Ref Range Status   Result 1 Comment  Final    Comment: (NOTE) KOH/Calcofluor preparation:  no fungus observed. Performed At: [facility] [place], [place] [id number] Rush Farmer MD [id number]   Fungus Culture Result     Status: None   Collection Time: 10/04/19  2:45 PM  Result Value Ref Range Status   Result 1 Comment  Final    Comment: (NOTE) KOH/Calcofluor preparation:  no fungus observed. Performed At: [facility] [place], [place] [id number] Rush Farmer MD  [id number]          Radiology Studies: No results found.  Scheduled Meds: . amLODipine  5 mg Oral Daily  . Budeson-Glycopyrrol-Formoterol  2 puff Oral BID  . Chlorhexidine Gluconate Cloth  6 each Topical Daily  . clorazepate  3.75 mg Oral BID  . docusate sodium  100 mg Oral BID  . ipratropium-albuterol  3 mL Nebulization TID  . levothyroxine  112 mcg Oral Daily  . mouth rinse  15 mL Mouth Rinse BID  . metoprolol succinate  50 mg Oral BID  . predniSONE  40 mg Oral Q breakfast  . rivaroxaban  20 mg Oral Q breakfast  . rosuvastatin  10 mg Oral QPM  . sodium chloride flush  10-40 mL Intracatheter Q12H  . valACYclovir  500 mg Oral Daily   Continuous Infusions:   LOS: 11 days        Hosie Poisson, MD [facility]   To contact the attending provider between 7A-7P or the covering provider during after hours 7P-7A, please log into the web site [email or web address] and access using universal [place] password for that web site. If you do not have the password, please call the hospital operator.  10/11/2019, 5:23 PM

## 2019-10-11 NOTE — TOC Progression Note (Signed)
Transition of Care [facility]) - Progression Note    Patient Details  Name: Erin Myers MRN: [id number] Date of Birth: 03/17/1940  [facility] CM/SW Contact  Nanda Bittick, Juliann Pulse, RN Phone Number: 10/11/2019, 3:05 PM  Clinical Narrative: Alvis Lemmings rep cory able to accept for only HHPT.see prior note for additional update.      Expected Discharge Plan: [place] Barriers to Discharge: Continued Medical Work up  Expected Discharge Plan and Services Expected Discharge Plan: [place]   Discharge Planning Services: CM Consult Post Acute Care Choice: [place] arrangements for the past 2 months: [place]: [place]: [place] Date [place]: 10/11/19 Time [place]: [id number] Representative spoke with at [place]: [place] ([place]) Interventions    Readmission Risk Interventions No flowsheet data found.

## 2019-10-11 NOTE — Progress Notes (Addendum)
Palliative:  HPI: 78 y.o. female  with past medical history of stage IV non-small cell lung cancer, interstitial pneumonitis, PE, HTN, HLD, diabetes, hypothyroidism, h/o ovarian cancer, sleep apnea, anxiety. Also with lung cancer (diagnosis May 2019) undergoing chemotherapy with Dr. Mohamed, developed interstitial pneumonitis secondary to Keytruda early 2021 treated with prednisone and Keytruda on hold. Admitted on 09/30/2019 with worsening shortness of breath. S/P bronchoscopy 7/23 showing adenocarcinoma of bilateral lungs. Ongoing antibiotic for multilobar pneumonia and steroid treatment but poor progression and concern that worsening is secondary to progressive cancer. Oxygen needs down to 7L nasal cannula.   I met again today with Andy and husband, David, at bedside. She has been able to come down to 7L oxygen and is hopeful for potential discharge tomorrow if oxygen requirements allow. They had questions/concern about process of getting oxygen for home to prepare for discharge which I answered to the best of my ability.   We also spent time discussing and reviewing Living Will. Discussed the different parts of Living Will and explained the scenarios and applications of these parts to scenarios that could happen and important for her and her husband (HCPOA) to discuss.   All questions/concerns addressed. Emotional support provided.   Exam: Alert, oriented. Breathing regular, unlabored at rest. 7L oxygen. Productive cough with thin, clear production. Abd flat.   Plan: - Hopeful for return home tomorrow. Hopeful for continued oncology treatment.  - They are working on Living Will and notified that this can likely be arranged to be notarized at cancer center especially if they are notified ahead of time this can likely be arranged with spiritual care services.   40 min  Alicia Parker, NP Palliative Medicine Team Pager [phone] (Please see [email or web address] for schedule) Team Phone [phone]     Greater than 50%  of this time was spent counseling and coordinating care related to the above assessment and plan  

## 2019-10-11 NOTE — Progress Notes (Signed)
SATURATION QUALIFICATIONS: (This note is used to comply with regulatory documentation for home oxygen)  Patient Saturations on 7 Liters [place] at rest 92%    Patient Saturations on 7 Liters of oxygen while Ambulating = 84-89%

## 2019-10-11 NOTE — Care Management Important Message (Signed)
Important Message  Patient Details IM Letter given to the Patient Name: Erin Myers MRN: [id number] Date of Birth: [birth year]   Medicare Important Message Given:  Yes     Kerin Salen 10/11/2019, 12:35 PM

## 2019-10-12 LAB — BASIC METABOLIC PANEL
Anion gap: 8 (ref 5–15)
BUN: 29 mg/dL — ABNORMAL HIGH (ref 8–23)
CO2: 31 mmol/L (ref 22–32)
Calcium: 8.5 mg/dL — ABNORMAL LOW (ref 8.9–10.3)
Chloride: 105 mmol/L (ref 98–111)
Creatinine, Ser: 0.95 mg/dL (ref 0.44–1.00)
GFR calc Af Amer: >60 mL/min (ref >60)
GFR calc non Af Amer: 58 mL/min — ABNORMAL LOW (ref >60)
Glucose, Bld: 88 mg/dL (ref 70–99)
Potassium: 4 mmol/L (ref 3.5–5.1)
Sodium: 144 mmol/L (ref 135–145)

## 2019-10-12 MED ORDER — HEPARIN SOD (PORK) LOCK FLUSH 100 UNIT/ML IV SOLN
500.0000 [IU] | INTRAVENOUS | Status: AC | PRN
  Administered 2019-10-12: 500 [IU]

## 2019-10-12 MED ORDER — GUAIFENESIN-DM 100-10 MG/5ML PO SYRP: 5.0000 mL | ORAL_SOLUTION | ORAL | 0 refills | Status: AC | PRN

## 2019-10-12 MED ORDER — AMLODIPINE BESYLATE 5 MG PO TABS: 5.0000 mg | ORAL_TABLET | Freq: Every day | ORAL | 0 refills | Status: DC

## 2019-10-12 MED ORDER — PREDNISONE 20 MG PO TABS: ORAL_TABLET | ORAL | 0 refills | Status: DC

## 2019-10-12 MED ORDER — DOCUSATE SODIUM 100 MG PO CAPS: 100.0000 mg | ORAL_CAPSULE | Freq: Two times a day (BID) | ORAL | Status: DC

## 2019-10-12 NOTE — Plan of Care (Signed)

## 2019-10-14 DIAGNOSIS — Z9981 Dependence on supplemental oxygen: Secondary | ICD-10-CM | POA: Diagnosis not present

## 2019-10-14 DIAGNOSIS — E785 Hyperlipidemia, unspecified: Secondary | ICD-10-CM | POA: Diagnosis not present

## 2019-10-14 DIAGNOSIS — C3491 Malignant neoplasm of unspecified part of right bronchus or lung: Secondary | ICD-10-CM | POA: Diagnosis not present

## 2019-10-14 DIAGNOSIS — Z7951 Long term (current) use of inhaled steroids: Secondary | ICD-10-CM | POA: Diagnosis not present

## 2019-10-14 DIAGNOSIS — Z6827 Body mass index (BMI) 27.0-27.9, adult: Secondary | ICD-10-CM | POA: Diagnosis not present

## 2019-10-14 DIAGNOSIS — Z9181 History of falling: Secondary | ICD-10-CM | POA: Diagnosis not present

## 2019-10-14 DIAGNOSIS — I10 Essential (primary) hypertension: Secondary | ICD-10-CM | POA: Diagnosis not present

## 2019-10-14 DIAGNOSIS — J8489 Other specified interstitial pulmonary diseases: Secondary | ICD-10-CM | POA: Diagnosis not present

## 2019-10-14 DIAGNOSIS — E039 Hypothyroidism, unspecified: Secondary | ICD-10-CM | POA: Diagnosis not present

## 2019-10-14 DIAGNOSIS — J9621 Acute and chronic respiratory failure with hypoxia: Secondary | ICD-10-CM | POA: Diagnosis not present

## 2019-10-14 DIAGNOSIS — Z87891 Personal history of nicotine dependence: Secondary | ICD-10-CM | POA: Diagnosis not present

## 2019-10-14 DIAGNOSIS — J9811 Atelectasis: Secondary | ICD-10-CM | POA: Diagnosis not present

## 2019-10-14 DIAGNOSIS — E669 Obesity, unspecified: Secondary | ICD-10-CM | POA: Diagnosis not present

## 2019-10-14 DIAGNOSIS — Z8543 Personal history of malignant neoplasm of ovary: Secondary | ICD-10-CM | POA: Diagnosis not present

## 2019-10-14 DIAGNOSIS — E119 Type 2 diabetes mellitus without complications: Secondary | ICD-10-CM | POA: Diagnosis not present

## 2019-10-14 DIAGNOSIS — M170 Bilateral primary osteoarthritis of knee: Secondary | ICD-10-CM | POA: Diagnosis not present

## 2019-10-14 DIAGNOSIS — J449 Chronic obstructive pulmonary disease, unspecified: Secondary | ICD-10-CM | POA: Diagnosis not present

## 2019-10-14 DIAGNOSIS — G4733 Obstructive sleep apnea (adult) (pediatric): Secondary | ICD-10-CM | POA: Diagnosis not present

## 2019-10-14 DIAGNOSIS — Z7901 Long term (current) use of anticoagulants: Secondary | ICD-10-CM | POA: Diagnosis not present

## 2019-10-14 DIAGNOSIS — F419 Anxiety disorder, unspecified: Secondary | ICD-10-CM | POA: Diagnosis not present

## 2019-10-14 DIAGNOSIS — Z7952 Long term (current) use of systemic steroids: Secondary | ICD-10-CM | POA: Diagnosis not present

## 2019-10-14 NOTE — Discharge Summary (Signed)
Physician Discharge Summary  Erin Myers QMV:[id number] DOB: 1940-07-04 DOA: 09/30/2019  PCP: [name], MD  Admit date: 09/30/2019 Discharge date: 10/12/2019  Admitted From: Home.  Disposition:  Home.   Recommendations for Outpatient Follow-up:  1. Follow up with PCP in 1-2 weeks 2. Please obtain BMP/CBC in one week 3. Please follow up with Dr [name] as scheduled.   Home Health: yes.   Discharge Condition:guarded.  CODE STATUS:full code.  Diet recommendation: Heart Healthy   Brief/Interim Summary: 79 year old lady with prior history of lung cancer, completed 10 cycles of chemo, did not tolerate Keytruda, interstitial lung disease, COPD, chronic respiratory failure on 4 L of nasal cannula oxygen presents with shortness of breath.  Oncology and pulmonologist consulted she completed 10 days of IV antibiotics and currently on steroids and bronchodilators.  She underwent bronchoscopy which showed bilateral adenocarcinoma of the lungs.  She initially required 10 lit of HF [place] oxygen, started weaning her oxygen.  We were able to wean her down to 7 L/min today.   Discharge Diagnoses:  Principal Problem:   Acute on chronic respiratory failure with hypoxia (HCC) Active Problems:   OSA on CPAP   Essential hypertension   Dyslipidemia, goal LDL below 130   Family history of coronary artery disease   Hx of malignant carcinoid tumor   Sepsis ([place])   Adenocarcinoma of right lung, stage 4 (HCC)   Pneumonitis   Pneumonitis, interstitial (HCC)   AKI (acute kidney injury) ([place])   Lobar pneumonia ([place])   Aortic atherosclerosis ([place])   Palliative care by specialist  Acute on chronic respiratory failure with hypoxia probably secondary to progression of the lung cancer with superimposed interstitial lung disease.  Completed course of 10 days of IV antibiotics. Continue with steroids, duonebs and supportive management.  Appreciate pulmonology assistance.  Plan for chemotherapy  outpatient by Dr. [name] once we were able to wean her down to 7 to 6 L/min of nasal cannula oxygen and when she is more stable .  PT evaluation recommending home health PT which will be ordered on discharge.    Hypothyroidism Continue with Synthroid.   AKI Resolved   Hyperlipidemia Continue with Crestor   Essential hypertension Well-controlled Continue with 50 mg twice daily and Norvasc.   Obstructive sleep apnea on CPAP  Mild anemia of chronic disease:  Hemoglobin stable around 11.    Leukocytosis Probably reactive.  Versus from steroids.  Stage 3 b CKD Creatinine ranging from 1.1 to 1.5.  Continue to monitor.    H/o VTE:  Resume Xarelto.   Discharge Instructions  Discharge Instructions    Diet - low sodium heart healthy   Complete by: As directed    Discharge instructions   Complete by: As directed    Follow up with Dr [name] in one week.     Allergies as of 10/12/2019      Reactions   Pembrolizumab Anaphylaxis   Adhesive [tape] Other (See Comments)   Irritates skin    Codeine Other (See Comments)   Skin crawlinf feeling   Lactose Intolerance (gi) Diarrhea   Monosodium Glutamate Diarrhea   Sulfa Antibiotics Nausea And Vomiting      Medication List    STOP taking these medications   amoxicillin-clavulanate 875-125 MG tablet Commonly known as: AUGMENTIN   lidocaine-prilocaine cream Commonly known as: EMLA   methylPREDNISolone 4 MG Tbpk tablet Commonly known as: MEDROL DOSEPAK     TAKE these medications   amLODipine 5 MG tablet Commonly known  as: NORVASC Take 1 tablet (5 mg total) by mouth daily.   Breztri Aerosphere 160-9-4.8 MCG/ACT Aero Generic drug: Budeson-Glycopyrrol-Formoterol Take 2 puffs by mouth 2 (two) times daily.   clorazepate 7.5 MG tablet Commonly known as: TRANXENE Take 3.75 mg by mouth in the morning and at bedtime.   docusate sodium 100 MG capsule Commonly known as: COLACE Take 1 capsule (100 mg  total) by mouth 2 (two) times daily.   guaiFENesin-dextromethorphan 100-10 MG/5ML syrup Commonly known as: ROBITUSSIN DM Take 5 mLs by mouth every 4 (four) hours as needed for cough.   ipratropium-albuterol 0.5-2.5 (3) MG/3ML Soln Commonly known as: DUONEB INHALE THE CONTENTS OF 1 VIAL VIA NEBULIZER 3 TIMES A DAY. What changed:   how much to take  how to take this  when to take this  reasons to take this  additional instructions   levothyroxine 112 MCG tablet Commonly known as: SYNTHROID Take 112 mcg by mouth daily.   metoprolol succinate 50 MG 24 hr tablet Commonly known as: TOPROL-XL Take 50 mg by mouth 2 (two) times daily.   predniSONE 20 MG tablet Commonly known as: DELTASONE Prednisone 30 mg daily for 3 days followed by  Prednisone 20 mg daily for 3 days followed by  Prednisone 10 mg daily for 3 days. What changed:   medication strength  See the new instructions.   rosuvastatin 10 MG tablet Commonly known as: CRESTOR Take 10 mg by mouth every evening.   valACYclovir 500 MG tablet Commonly known as: VALTREX Take 500 mg by mouth daily.   VISION FORMULA 2 PO Take 1 tablet by mouth daily. OTC "Vision pills for Macular degeneration"   Xarelto 20 MG Tabs tablet Generic drug: rivaroxaban TAKE 1 TABLET BY MOUTH DAILY WITH SUPPER. What changed: See the new instructions.       Follow-up Information    [name], MD. Schedule an appointment as soon as possible for a visit in 1 week(s).   Specialty: Internal Medicine Contact information: [place] [address] [phone]        Curt Bears, MD. Schedule an appointment as soon as possible for a visit in 1 week(s).   Specialty: Oncology Contact information: [place] [address] [phone]        [facility] Follow up.   Specialty: Home Health Services Why: agency will provide home health services. Contact information: [place] [address] [phone]              Allergies  Allergen Reactions  . Pembrolizumab Anaphylaxis  . Adhesive [Tape] Other (See Comments)    Irritates skin   . Codeine Other (See Comments)    Skin crawlinf feeling  . Lactose Intolerance (Gi) Diarrhea  . Monosodium Glutamate Diarrhea  . Sulfa Antibiotics Nausea And Vomiting    Consultations:  None.    Procedures/Studies: DG Chest 2 View  Result Date: 09/20/2019 CLINICAL DATA:  Cough and pneumonia. EXAM: CHEST - 2 VIEW COMPARISON:  July 04, 2018 FINDINGS: The mediastinal contour and cardiac silhouette are stable. Increased pulmonary interstitium with ground-glass opacity is identified throughout the right lung and left mid lung and left lung base. Minimal left pleural effusion is identified. Bony structures are stable. IMPRESSION: Increased pulmonary interstitium with ground-glass opacity is identified throughout the right lung and left mid lung and left lung base. Favor pneumonia. Minimal left pleural effusion. Electronically Signed   By: Mallie Darting.D.  On: 09/20/2019 11:16   CT Chest Wo Contrast  Result Date: 09/30/2019 CLINICAL DATA:  Increasing shortness of breath EXAM: CT CHEST WITHOUT CONTRAST TECHNIQUE: Multidetector CT imaging of the chest was performed following the standard protocol without IV contrast. COMPARISON:  Chest x-ray from earlier in the same day, CT from 08/16/2019 FINDINGS: Cardiovascular: Somewhat limited due to lack of IV contrast. Aortic calcifications are noted. No aneurysmal dilatation is seen. No cardiac enlargement is noted. No pericardial effusion is seen. Right chest wall port is noted. Mediastinum/Nodes: Thoracic inlet is within normal limits. Scattered tiny mediastinal lymph nodes are noted. No sizable hilar or mediastinal nodes are seen. The esophagus as visualized appears within normal limits. Lungs/Pleura: The lungs again demonstrate bilateral lower lobe and upper  lobe infiltrates with increasing infiltrate in the lower lobes bilaterally. There is a partially solid nodule identified in the left upper lobe best seen on image number 87 of series 5. This has increased in size from the prior exam now measuring almost 10 mm. It previously measured 7 mm. Similar nodule is noted posteriorly in the left upper lobe on image number 69 of series 5 also increased in size when compared with the prior exam. Given their abrupt onset and increase these are felt to represent postinflammatory change. No sizable effusion is seen. No pneumothorax is noted. Postsurgical changes are again seen in the right middle lobe. Upper Abdomen: Visualized upper abdomen is within normal limits. Musculoskeletal: Degenerative changes of the thoracic spine are noted. IMPRESSION: Increasing bilateral consolidation in the lower lobes. Additionally some increase in nodular appearing areas are noted particularly in the left upper lobe consistent with progressive infectious pneumonia. Continued follow-up is recommended with appropriate therapy. Aortic Atherosclerosis (ICD10-I70.0). Electronically Signed   By: Inez Catalina M.D.   On: 09/30/2019 16:09   DG CHEST PORT 1 VIEW  Result Date: 10/06/2019 CLINICAL DATA:  Respiratory distress, oxygen desaturation EXAM: PORTABLE CHEST 1 VIEW COMPARISON:  10/05/2019 FINDINGS: Slight interval increase in heterogeneous airspace opacity of the right lung, particularly in the right upper lobe. Unchanged heterogeneous opacity elsewhere, with small, layering bilateral pleural effusions. Right chest port catheter. Heart and mediastinum are unremarkable. IMPRESSION: 1. Slight interval increase in heterogeneous airspace opacity of the right lung, particularly in the right upper lobe, consistent with worsened edema and/or infection. 2. Unchanged heterogeneous opacity elsewhere, with small, layering bilateral pleural effusions. Electronically Signed   By: Eddie Candle M.D.   On:  10/06/2019 10:09   DG CHEST PORT 1 VIEW  Result Date: 10/05/2019 CLINICAL DATA:  Acute respiratory distress. EXAM: PORTABLE CHEST 1 VIEW COMPARISON:  10/04/2019 FINDINGS: Right IJ Port-A-Cath unchanged. Lungs are adequately inflated demonstrate persistent hazy airspace opacification over the mid to lower lungs bilaterally and right upper lobe likely due to multifocal infection. Cardiomediastinal silhouette and remainder of the exam is unchanged. IMPRESSION: Persistent bilateral hazy airspace process unchanged and likely due to multifocal infection. Right IJ Port-A-Cath unchanged. Electronically Signed   By: Marin Olp M.D.   On: 10/05/2019 12:04   DG CHEST PORT 1 VIEW  Result Date: 10/04/2019 CLINICAL DATA:  Status post bronchoscopy with biopsy EXAM: PORTABLE CHEST 1 VIEW COMPARISON:  09/30/2019, CT chest 09/30/2019 FINDINGS: Right-sided central venous port tip over the right atrium. Progression of ground-glass disease in the right thorax. Patchy consolidations and ground-glass opacities on the left are grossly unchanged. No pleural effusion or pneumothorax is seen. Stable cardiomediastinal silhouette. IMPRESSION: Progression of ground-glass disease in the right thorax. Grossly stable patchy consolidations  and ground-glass opacities in the left lung. Electronically Signed   By: Donavan Foil M.D.   On: 10/04/2019 17:52   DG Chest Port 1 View  Result Date: 09/30/2019 CLINICAL DATA:  Shortness of breath EXAM: PORTABLE CHEST 1 VIEW COMPARISON:  09/20/2019 FINDINGS: Right chest wall port catheter tip overlies the superior right atrium. Bibasilar atelectasis. Mild interstitial prominence with hazy opacities, right greater than left. No significant pleural effusion. Stable cardiomediastinal contours. IMPRESSION: Similar nonspecific interstitial prominence and hazy opacities. Electronically Signed   By: Macy Mis M.D.   On: 09/30/2019 13:52   DG C-ARM BRONCHOSCOPY  Result Date: 10/04/2019 C-ARM  BRONCHOSCOPY: Fluoroscopy was utilized by the requesting physician.  No radiographic interpretation.       Subjective: No new complaints, looking forward to going home.   Discharge Exam: Vitals:   10/12/19 0936 10/12/19 1347  BP: (!) 156/54   Pulse: 75   Resp:    Temp:    SpO2:  95%   Vitals:   10/12/19 0620 10/12/19 0742 10/12/19 0936 10/12/19 1347  BP: (!) 150/62  (!) 156/54   Pulse: 63  75   Resp: 18     Temp: 97.9 F (36.6 C)     TempSrc: Oral     SpO2: 92% 94%  95%  Weight:      Height:        General: Pt is alert, awake, not in acute distress Cardiovascular: RRR, S1/S2 +, no rubs, no gallops Respiratory: CTA bilaterally, no wheezing, no rhonchi Abdominal: Soft, NT, ND, bowel sounds + Extremities: no edema, no cyanosis    The results of significant diagnostics from this hospitalization (including imaging, microbiology, ancillary and laboratory) are listed below for reference.     Microbiology: No results found for this or any previous visit (from the past 240 hour(s)).   Labs: BNP (last 3 results) Recent Labs    09/30/19 1325  BNP 58.5   Basic Metabolic Panel: Recent Labs  Lab 10/08/19 0934 10/12/19 0343  NA  --  144  K  --  4.0  CL  --  105  CO2  --  31  GLUCOSE  --  88  BUN  --  29*  CREATININE 1.28* 0.95  CALCIUM  --  8.5*   Liver Function Tests: No results for input(s): AST, ALT, ALKPHOS, BILITOT, PROT, ALBUMIN in the last 168 hours. No results for input(s): LIPASE, AMYLASE in the last 168 hours. No results for input(s): AMMONIA in the last 168 hours. CBC: Recent Labs  Lab 10/08/19 0444 10/09/19 0525 10/10/19 0354  WBC 12.3* 13.7* 14.4*  HGB 11.3* 11.6* 11.5*  HCT 37.0 39.4 37.8  MCV 104.2* 105.9* 105.3*  PLT 159 154 163   Cardiac Enzymes: No results for input(s): CKTOTAL, CKMB, CKMBINDEX, TROPONINI in the last 168 hours. BNP: Invalid input(s): POCBNP CBG: No results for input(s): GLUCAP in the last 168  hours. D-Dimer No results for input(s): DDIMER in the last 72 hours. Hgb A1c No results for input(s): HGBA1C in the last 72 hours. Lipid Profile No results for input(s): CHOL, HDL, LDLCALC, TRIG, CHOLHDL, LDLDIRECT in the last 72 hours. Thyroid function studies No results for input(s): TSH, T4TOTAL, T3FREE, THYROIDAB in the last 72 hours.  Invalid input(s): FREET3 Anemia work up No results for input(s): VITAMINB12, FOLATE, FERRITIN, TIBC, IRON, RETICCTPCT in the last 72 hours. Urinalysis    Component Value Date/Time   COLORURINE STRAW (A) 10/02/2019 [place] 10/02/2019 1030  LABSPEC 1.013 10/02/2019 1030   PHURINE 6.0 10/02/2019 1030   GLUCOSEU NEGATIVE 10/02/2019 1030   HGBUR NEGATIVE 10/02/2019 1030   BILIRUBINUR NEGATIVE 10/02/2019 1030   [place] 10/02/2019 1030   PROTEINUR NEGATIVE 10/02/2019 1030   NITRITE NEGATIVE 10/02/2019 1030   LEUKOCYTESUR NEGATIVE 10/02/2019 1030   Sepsis Labs Invalid input(s): PROCALCITONIN,  WBC,  LACTICIDVEN Microbiology No results found for this or any previous visit (from the past 240 hour(s)).   Time coordinating discharge: 36 minutes.   SIGNED:   Hosie Poisson, MD  [facility]

## 2019-10-15 NOTE — Progress Notes (Signed)
[facility] OFFICE PROGRESS NOTE  Erin Battles, MD [place] [address]  DIAGNOSIS: 1) Stage IV (T3, N0, M1 a)non-small cell lung cancer, well-differentiated adenocarcinoma presented with multifocal disease involving mainly the right upper lobe as well as the right middle lobe and left lower lobe of the lung diagnosed in May 2019. 2) bilateral small pulmonary emboli diagnosed incidentally on CT scan of the chest on October 16, 2017  Guardant 360 testing was negative for actionable mutations.  PRIOR THERAPY: 1) Carboplatin for an AUC of 5, Alimta 500 mg/m2 and Keytruda 200 mg IV given every 3 weeks. First dosegiven on 08/18/2017.Starting maintenance alimta and Keytruda from cycle #5. She took a break after cycle #15 for several months.Status post 23cycles.Starting from cycle #16, she resumed treatment with single agent Keytruda on 02/12/2019.Treatment on hold 07/09/2019 due to suspicious pneumonitis 2) Xarelto 15 mg p.o. twice daily for the first 3 weeks followed by 20 mg p.o. Daily.  CURRENT THERAPY: Single agent Alimta 500 mg per metered squared IV every 3 weeks.  First dose expected on 10/24/2019.  INTERVAL HISTORY: Erin Myers 79 y.o. female returns to the clinic today for a follow-up visit.  The patient was recently hospitalized from 09/30/2019 ~10/12/2019 for acute on chronic respiratory failure.  While in the hospital, the patient was treated with IV antibiotics, steroids, and bronchodilators.  She had a bronchoscopy performed which demonstrated persistent bilateral adenocarcinoma.  The patient's acute on chronic respiratory failure was felt to be secondary to interstitial lung disease and stage IV lung cancer.  The patient was discharged and she is feeling fair except she is feeling fatigued.  A referral was placed to Dr. Durenda Hurt at [facility] for second opinion.  This is out of network for the patient and she was unable to have this  visit.  Since being discharged, the patient is still on oxygen via nasal cannula.  She is currently on 7 L of oxygen or more if exerting herself. She denies any recent fever, chills, night sweats, or weight loss.  She reports her baseline dyspnea on exertion and chronic cough which produces clear "watery" sputum.  She denies any nausea, vomiting, or constipation.  She reports mild diarrhea which she attributes to her recent antibiotic use.  She denies any headache or visual changes.  The patient is here today for evaluation and a more detailed discussion regarding her current condition and recommended treatment options.   MEDICAL HISTORY: Past Medical History:  Diagnosis Date  . Anxiety   . Diabetes mellitus without complication ([place])   . Dyslipidemia   . HTN (hypertension)   . Hyperlipemia   . Hypothyroid   . Hypothyroidism   . Lung cancer ([place]) 2000   Carcinoid, s/p RLL lobectomy  . Obesity   . Ovarian cancer ([place])   . Palpitations 2009 and 2006   Negative nuclear stress test   . Sleep apnea    on C-pap    ALLERGIES:  is allergic to pembrolizumab, adhesive [tape], codeine, lactose intolerance (gi), monosodium glutamate, and sulfa antibiotics.  MEDICATIONS:  Current Outpatient Medications  Medication Sig Dispense Refill  . amLODipine (NORVASC) 5 MG tablet Take 1 tablet (5 mg total) by mouth daily. 30 tablet 0  . BREZTRI AEROSPHERE 160-9-4.8 MCG/ACT AERO Take 2 puffs by mouth 2 (two) times daily.    . clorazepate (TRANXENE) 7.5 MG tablet Take 3.75 mg by mouth in the morning and at bedtime.     . docusate sodium (COLACE)  100 MG capsule Take 1 capsule (100 mg total) by mouth 2 (two) times daily. 10 capsule   . guaiFENesin-dextromethorphan (ROBITUSSIN DM) 100-10 MG/5ML syrup Take 5 mLs by mouth every 4 (four) hours as needed for cough. 118 mL 0  . ipratropium-albuterol (DUONEB) 0.5-2.5 (3) MG/3ML SOLN INHALE THE CONTENTS OF 1 VIAL VIA NEBULIZER 3 TIMES A DAY. (Patient taking  differently: Inhale 3 mLs into the lungs 3 (three) times daily as needed. ) 240 mL 11  . levothyroxine (SYNTHROID) 112 MCG tablet Take 112 mcg by mouth daily.    . metoprolol succinate (TOPROL-XL) 50 MG 24 hr tablet Take 50 mg by mouth 2 (two) times daily.     . Multiple Vitamins-Minerals (VISION FORMULA 2 PO) Take 1 tablet by mouth daily. OTC "Vision pills for Macular degeneration"     . predniSONE (DELTASONE) 20 MG tablet Prednisone 30 mg daily for 3 days followed by  Prednisone 20 mg daily for 3 days followed by  Prednisone 10 mg daily for 3 days. 10 tablet 0  . rosuvastatin (CRESTOR) 10 MG tablet Take 10 mg by mouth every evening.     . valACYclovir (VALTREX) 500 MG tablet Take 500 mg by mouth daily.    [name] 20 MG TABS tablet TAKE 1 TABLET BY MOUTH DAILY WITH SUPPER. (Patient taking differently: Take 20 mg by mouth daily with supper. ) 30 tablet 1  . dexamethasone (DECADRON) 4 MG tablet Please take 1 tablet twice a day the day before, the day of, and the day after chemo therapy. 40 tablet 2  . folic acid (FOLVITE) 1 MG tablet Take 1 tablet (1 mg total) by mouth daily. 30 tablet 2  . prochlorperazine (COMPAZINE) 10 MG tablet Take 1 tablet (10 mg total) by mouth every 6 (six) hours as needed. 30 tablet 2   No current facility-administered medications for this visit.    SURGICAL HISTORY:  Past Surgical History:  Procedure Laterality Date  . ABDOMINAL HYSTERECTOMY  2001  . BRONCHIAL BIOPSY  10/04/2019   Procedure: BRONCHIAL BIOPSIES;  Surgeon: Marshell Garfinkel, MD;  Location: [facility];  Service: Cardiopulmonary;;  . BRONCHIAL BRUSHINGS  10/04/2019   Procedure: BRONCHIAL BRUSHINGS;  Surgeon: Marshell Garfinkel, MD;  Location: [facility];  Service: Cardiopulmonary;;  . GRADED EXERCISE TOLERANCE TEST  08/2015    Blood pressure demonstrated a hypertensive response to exercise.  There was less than 0.41mm of horizontal ST segment depression in the inferolateral leads. No diagnostic criterial  for ischemia. Moderately impaired exercise tolerance. The patient achieved 5.8 mets. This is a low risk study.  . IR FLUORO GUIDE PORT INSERTION RIGHT  08/17/2017  . IR THORACENTESIS ASP PLEURAL SPACE W/IMG GUIDE  07/04/2018  . IR US GUIDE VASC ACCESS RIGHT  08/17/2017  . LOBECTOMY  2000   RLL  . NM MYOCAR PERF WALL MOTION  08/20/2007   EF 74%  EXERCISE CAPACITY 7 METS  . OOPHORECTOMY  2001  . TONSILLECTOMY    . VIDEO BRONCHOSCOPY Bilateral 07/20/2017   Procedure: VIDEO BRONCHOSCOPY WITH FLUORO;  Surgeon: Collene Gobble, MD;  Location: [facility];  Service: Cardiopulmonary;  Laterality: Bilateral;  . VIDEO BRONCHOSCOPY N/A 10/04/2019   Procedure: VIDEO BRONCHOSCOPY WITH FLUORO;  Surgeon: Marshell Garfinkel, MD;  Location: [facility];  Service: Cardiopulmonary;  Laterality: N/A;    REVIEW OF SYSTEMS:   Review of Systems  Constitutional: Positive for fatigue. Negative for appetite change, chills, fever and unexpected weight change.  HENT: Negative for mouth sores, nosebleeds,  sore throat and trouble swallowing.   Eyes: Negative for eye problems and icterus.  Respiratory: Positive for baseline dyspnea on exertion and cough.  Negative for hemoptysis and wheezing.   Cardiovascular: Negative for chest pain and leg swelling.  Gastrointestinal: Negative for abdominal pain, constipation, diarrhea (resolved), nausea and vomiting.  Genitourinary: Negative for bladder incontinence, difficulty urinating, dysuria, frequency and hematuria.   Musculoskeletal: Negative for back pain, gait problem, neck pain and neck stiffness.  Skin: Negative for itching and rash.  Neurological: Negative for dizziness, extremity weakness, gait problem, headaches, light-headedness and seizures.  Hematological: Negative for adenopathy. Does not bruise/bleed easily.  Psychiatric/Behavioral: Negative for confusion, depression and sleep disturbance. The patient is not nervous/anxious.     PHYSICAL EXAMINATION:  Blood pressure  (!) 134/50, pulse 90, temperature 97.9 F (36.6 C), temperature source Temporal, resp. rate 18, height 5\' 5"  (1.651 m), weight 167 lb 12.8 oz (76.1 kg), SpO2 92 %.  ECOG PERFORMANCE STATUS: 3 - Symptomatic, >50% confined to bed  Physical Exam  Constitutional: Oriented to person, place, and time and well-developed, well-nourished, and in no distress.  HENT:  Head: Normocephalic and atraumatic.  Mouth/Throat: Oropharynx is clear and moist. No oropharyngeal exudate.  Eyes: Conjunctivae are normal. Right eye exhibits no discharge. Left eye exhibits no discharge. No scleral icterus.  Neck: Normal range of motion. Neck supple.  Cardiovascular: Normal rate, regular rhythm, normal heart sounds and intact distal pulses.   Pulmonary/Chest: Effort normal. Crackles noted bilaterally in both lungs. No respiratory distress. On 7 L of O2 via nasal cannula. Abdominal: Soft. Bowel sounds are normal. Exhibits no distension and no mass. There is no tenderness.  Musculoskeletal: Normal range of motion. Exhibits no edema.  Lymphadenopathy:    No cervical adenopathy.  Neurological: Alert and oriented to person, place, and time. Exhibits normal muscle tone. Examined in the wheelchair. Skin: Skin is warm and dry. No rash noted. Not diaphoretic. No erythema. No pallor.  Psychiatric: Mood, memory and judgment normal.  Vitals reviewed.  LABORATORY DATA: Lab Results  Component Value Date   WBC 14.7 (H) 10/17/2019   HGB 12.8 10/17/2019   HCT 41.4 10/17/2019   MCV 103.2 (H) 10/17/2019   PLT 233 10/17/2019      Chemistry      Component Value Date/Time   NA 141 10/17/2019 1430   K 5.0 10/17/2019 1430   CL 102 10/17/2019 1430   CO2 30 10/17/2019 1430   BUN 34 (H) 10/17/2019 1430   CREATININE 1.17 (H) 10/17/2019 1430      Component Value Date/Time   CALCIUM 10.0 10/17/2019 1430   ALKPHOS 70 10/17/2019 1430   AST 11 (L) 10/17/2019 1430   ALT 14 10/17/2019 1430   BILITOT 0.4 10/17/2019 1430        RADIOGRAPHIC STUDIES:  DG Chest 2 View  Result Date: 09/20/2019 CLINICAL DATA:  Cough and pneumonia. EXAM: CHEST - 2 VIEW COMPARISON:  July 04, 2018 FINDINGS: The mediastinal contour and cardiac silhouette are stable. Increased pulmonary interstitium with ground-glass opacity is identified throughout the right lung and left mid lung and left lung base. Minimal left pleural effusion is identified. Bony structures are stable. IMPRESSION: Increased pulmonary interstitium with ground-glass opacity is identified throughout the right lung and left mid lung and left lung base. Favor pneumonia. Minimal left pleural effusion. Electronically Signed   By: Abelardo Diesel M.D.   On: 09/20/2019 11:16   CT Chest Wo Contrast  Result Date: 09/30/2019 CLINICAL DATA:  Increasing shortness  of breath EXAM: CT CHEST WITHOUT CONTRAST TECHNIQUE: Multidetector CT imaging of the chest was performed following the standard protocol without IV contrast. COMPARISON:  Chest x-ray from earlier in the same day, CT from 08/16/2019 FINDINGS: Cardiovascular: Somewhat limited due to lack of IV contrast. Aortic calcifications are noted. No aneurysmal dilatation is seen. No cardiac enlargement is noted. No pericardial effusion is seen. Right chest wall port is noted. Mediastinum/Nodes: Thoracic inlet is within normal limits. Scattered tiny mediastinal lymph nodes are noted. No sizable hilar or mediastinal nodes are seen. The esophagus as visualized appears within normal limits. Lungs/Pleura: The lungs again demonstrate bilateral lower lobe and upper lobe infiltrates with increasing infiltrate in the lower lobes bilaterally. There is a partially solid nodule identified in the left upper lobe best seen on image number 87 of series 5. This has increased in size from the prior exam now measuring almost 10 mm. It previously measured 7 mm. Similar nodule is noted posteriorly in the left upper lobe on image number 69 of series 5 also increased in  size when compared with the prior exam. Given their abrupt onset and increase these are felt to represent postinflammatory change. No sizable effusion is seen. No pneumothorax is noted. Postsurgical changes are again seen in the right middle lobe. Upper Abdomen: Visualized upper abdomen is within normal limits. Musculoskeletal: Degenerative changes of the thoracic spine are noted. IMPRESSION: Increasing bilateral consolidation in the lower lobes. Additionally some increase in nodular appearing areas are noted particularly in the left upper lobe consistent with progressive infectious pneumonia. Continued follow-up is recommended with appropriate therapy. Aortic Atherosclerosis (ICD10-I70.0). Electronically Signed   By: Inez Catalina M.D.   On: 09/30/2019 16:09   DG CHEST PORT 1 VIEW  Result Date: 10/06/2019 CLINICAL DATA:  Respiratory distress, oxygen desaturation EXAM: PORTABLE CHEST 1 VIEW COMPARISON:  10/05/2019 FINDINGS: Slight interval increase in heterogeneous airspace opacity of the right lung, particularly in the right upper lobe. Unchanged heterogeneous opacity elsewhere, with small, layering bilateral pleural effusions. Right chest port catheter. Heart and mediastinum are unremarkable. IMPRESSION: 1. Slight interval increase in heterogeneous airspace opacity of the right lung, particularly in the right upper lobe, consistent with worsened edema and/or infection. 2. Unchanged heterogeneous opacity elsewhere, with small, layering bilateral pleural effusions. Electronically Signed   By: Eddie Candle M.D.   On: 10/06/2019 10:09   DG CHEST PORT 1 VIEW  Result Date: 10/05/2019 CLINICAL DATA:  Acute respiratory distress. EXAM: PORTABLE CHEST 1 VIEW COMPARISON:  10/04/2019 FINDINGS: Right IJ Port-A-Cath unchanged. Lungs are adequately inflated demonstrate persistent hazy airspace opacification over the mid to lower lungs bilaterally and right upper lobe likely due to multifocal infection. Cardiomediastinal  silhouette and remainder of the exam is unchanged. IMPRESSION: Persistent bilateral hazy airspace process unchanged and likely due to multifocal infection. Right IJ Port-A-Cath unchanged. Electronically Signed   By: Marin Olp M.D.   On: 10/05/2019 12:04   DG CHEST PORT 1 VIEW  Result Date: 10/04/2019 CLINICAL DATA:  Status post bronchoscopy with biopsy EXAM: PORTABLE CHEST 1 VIEW COMPARISON:  09/30/2019, CT chest 09/30/2019 FINDINGS: Right-sided central venous port tip over the right atrium. Progression of ground-glass disease in the right thorax. Patchy consolidations and ground-glass opacities on the left are grossly unchanged. No pleural effusion or pneumothorax is seen. Stable cardiomediastinal silhouette. IMPRESSION: Progression of ground-glass disease in the right thorax. Grossly stable patchy consolidations and ground-glass opacities in the left lung. Electronically Signed   By: Madie Reno.D.  On: 10/04/2019 17:52   DG Chest Port 1 View  Result Date: 09/30/2019 CLINICAL DATA:  Shortness of breath EXAM: PORTABLE CHEST 1 VIEW COMPARISON:  09/20/2019 FINDINGS: Right chest wall port catheter tip overlies the superior right atrium. Bibasilar atelectasis. Mild interstitial prominence with hazy opacities, right greater than left. No significant pleural effusion. Stable cardiomediastinal contours. IMPRESSION: Similar nonspecific interstitial prominence and hazy opacities. Electronically Signed   By: Macy Mis M.D.   On: 09/30/2019 13:52   DG C-ARM BRONCHOSCOPY  Result Date: 10/04/2019 C-ARM BRONCHOSCOPY: Fluoroscopy was utilized by the requesting physician.  No radiographic interpretation.     ASSESSMENT/PLAN:  This is a very pleasant 79 year old Caucasian female diagnosed with stage IV non-small cell lung cancer, well differentiated adenocarcinoma. She presented with multifocal disease involving mainly the right upper lobe as well as right middle lobe and left lower lobe. She was  diagnosed in May 2019. She has no actionable mutations.  The patient was undergoing treatment with systemic chemotherapy with carboplatin, Alimta, and Keytruda.  Starting from cycle #5 she started on maintenance Alimta and Keytruda. Alimta was discontinued due to intolerance with fatigue. She requested to take a break after cycle #15.  She received her treatment with single agent agent Keytruda in December 2020 with cycle #16.  She is status post 23 cycles of treatment.   The patient was recently hospitalized for acute on chronic respiratory failure.  She had a bronchoscopy performed which was consistent with bilateral adenocarcinoma.  The patient was seen with Dr. [name].  Dr. [name] had a lengthy discussion with the patient about her current condition and treatment options.  Dr. [name] gave the patient the option of referral to palliative/hospice care versus continuing on single agent Alimta 500 mg IV every 3 weeks.  Dr. [name] does not believe that the patient is a candidate for docetaxel and Cyramza giving her poor performance status.  The patient is interested in proceeding with single agent Alimta for now. She is expected to receive her first dose of treatment on 10/24/19. We will also refer the patient to [facility] for a second opinion. The patient is interested in exploring all of her options.   We discussed the adverse side effects of treatment including but not limited to alopecia, myelosuppression, nausea and vomiting, peripheral neuropathy, liver or renal dysfunction as well as immunotherapy mediated adverse effects.   We will arrange for the patient to have a B12 injection while in the clinic today.     I sent prescriptions for 1 mg folic acid p.o. daily as well as Compazine 10 mg every 6 hours as needed for nausea.  I also have sent 4 mg tablets of decadron to her pharmacy. She was instructed to take 1 tablet twice a day the day before, the day of, and the day after chemotherapy.    The patient will follow-up in 2 weeks for a one-week follow-up visit after completing her first cycle of chemotherapy.  The patient was advised to call immediately if she has any concerning symptoms in the interval. The patient voices understanding of current disease status and treatment options and is in agreement with the current care plan. All questions were answered. The patient knows to call the clinic with any problems, questions or concerns. We can certainly see the patient much sooner if necessary.    Orders Placed This Encounter  Procedures  . CBC with Differential (Cancer Center Only)    Standing Status:   Standing    Number  of Occurrences:   20    Standing Expiration Date:   10/16/2020  . CMP ([facility] only)    Standing Status:   Standing    Number of Occurrences:   20    Standing Expiration Date:   10/16/2020  . Ambulatory referral to Hematology / Oncology    Referral Priority:   Routine    Referral Type:   Consultation    Referral Reason:   Second Opinion    Number of Visits Requested:   [place], PA-C 10/17/19   ADDENDUM: Hematology/Oncology Attending: I had a face-to-face encounter with the patient today.  I recommended her care plan.  This is very pleasant 79 years old white female with metastatic non-small cell lung cancer, adenocarcinoma initially treated with induction systemic chemotherapy with carboplatin, Alimta and Keytruda followed by maintenance treatment with Alimta and Keytruda for few cycles but Alimta was discontinued secondary to fatigue.  The patient continues further treatment with single agent Keytruda for several months. She was recently admitted to the hospital with worsening dyspnea and repeat bronchoscopy showed evidence for disease progression bilaterally in the left and right lung. She is currently on home oxygen 7 L/minute by nasal cannula. I had a lengthy discussion with the patient and her husband today about her  current condition and treatment options.  I gave the patient the option of palliative care and hospice referral because of her comorbidities and the disease progression but she declined this option. I discussed with her the option of resuming her treatment with single agent Alimta every 3 weeks.  I also discussed with her the option of second line treatment with docetaxel and Cyramza but I do not think the patient will be able to tolerate this option because of the significant toxicity. The patient is interested in resuming her treatment with Alimta every 3 weeks and she is expected to start the first cycle of this treatment next week. I will also refer the patient for a second opinion at [facility] to see if there is any other options for management of her condition. She will come back for follow-up visit in 2 weeks for evaluation and management of any adverse effect of her treatment. She will receive vitamin B12 injection today and we will start the patient on folic acid and Decadron premedication. She was advised to call immediately if she has any other concerning symptoms in the interval.  Disclaimer: This note was dictated with voice recognition software. Similar sounding words can inadvertently be transcribed and may be missed upon review. Eilleen Kempf, MD 10/17/19

## 2019-10-17 ENCOUNTER — Inpatient Hospital Stay: Payer: PPO

## 2019-10-17 ENCOUNTER — Other Ambulatory Visit: Payer: Self-pay

## 2019-10-17 ENCOUNTER — Inpatient Hospital Stay: Payer: PPO | Attending: Internal Medicine | Admitting: Physician Assistant

## 2019-10-17 ENCOUNTER — Encounter: Payer: Self-pay | Admitting: Physician Assistant

## 2019-10-17 ENCOUNTER — Other Ambulatory Visit: Payer: Self-pay | Admitting: Internal Medicine

## 2019-10-17 ENCOUNTER — Encounter: Payer: Self-pay | Admitting: *Deleted

## 2019-10-17 VITALS — BP 134/50 | HR 90 | Temp 97.9°F | Resp 18 | Ht 65.0 in | Wt 167.8 lb

## 2019-10-17 DIAGNOSIS — Z5112 Encounter for antineoplastic immunotherapy: Secondary | ICD-10-CM | POA: Insufficient documentation

## 2019-10-17 DIAGNOSIS — E669 Obesity, unspecified: Secondary | ICD-10-CM | POA: Insufficient documentation

## 2019-10-17 DIAGNOSIS — E785 Hyperlipidemia, unspecified: Secondary | ICD-10-CM | POA: Diagnosis not present

## 2019-10-17 DIAGNOSIS — C3491 Malignant neoplasm of unspecified part of right bronchus or lung: Secondary | ICD-10-CM | POA: Diagnosis not present

## 2019-10-17 DIAGNOSIS — I10 Essential (primary) hypertension: Secondary | ICD-10-CM | POA: Insufficient documentation

## 2019-10-17 DIAGNOSIS — C3411 Malignant neoplasm of upper lobe, right bronchus or lung: Secondary | ICD-10-CM | POA: Diagnosis not present

## 2019-10-17 DIAGNOSIS — Z7189 Other specified counseling: Secondary | ICD-10-CM | POA: Diagnosis not present

## 2019-10-17 DIAGNOSIS — Z79899 Other long term (current) drug therapy: Secondary | ICD-10-CM | POA: Insufficient documentation

## 2019-10-17 DIAGNOSIS — Z7901 Long term (current) use of anticoagulants: Secondary | ICD-10-CM | POA: Insufficient documentation

## 2019-10-17 DIAGNOSIS — E039 Hypothyroidism, unspecified: Secondary | ICD-10-CM | POA: Diagnosis not present

## 2019-10-17 DIAGNOSIS — C3481 Malignant neoplasm of overlapping sites of right bronchus and lung: Secondary | ICD-10-CM | POA: Insufficient documentation

## 2019-10-17 DIAGNOSIS — R197 Diarrhea, unspecified: Secondary | ICD-10-CM | POA: Insufficient documentation

## 2019-10-17 DIAGNOSIS — E119 Type 2 diabetes mellitus without complications: Secondary | ICD-10-CM | POA: Diagnosis not present

## 2019-10-17 DIAGNOSIS — Z8543 Personal history of malignant neoplasm of ovary: Secondary | ICD-10-CM | POA: Insufficient documentation

## 2019-10-17 DIAGNOSIS — Z95828 Presence of other vascular implants and grafts: Secondary | ICD-10-CM

## 2019-10-17 DIAGNOSIS — G4730 Sleep apnea, unspecified: Secondary | ICD-10-CM | POA: Diagnosis not present

## 2019-10-17 LAB — CMP (CANCER CENTER ONLY)
ALT: 14 U/L (ref 0–44)
AST: 11 U/L — ABNORMAL LOW (ref 15–41)
Albumin: 2.7 g/dL — ABNORMAL LOW (ref 3.5–5.0)
Alkaline Phosphatase: 70 U/L (ref 38–126)
Anion gap: 9 (ref 5–15)
BUN: 34 mg/dL — ABNORMAL HIGH (ref 8–23)
CO2: 30 mmol/L (ref 22–32)
Calcium: 10 mg/dL (ref 8.9–10.3)
Chloride: 102 mmol/L (ref 98–111)
Creatinine: 1.17 mg/dL — ABNORMAL HIGH (ref 0.44–1.00)
GFR, Est AFR Am: 52 mL/min — ABNORMAL LOW (ref >60)
GFR, Estimated: 45 mL/min — ABNORMAL LOW (ref >60)
Glucose, Bld: 161 mg/dL — ABNORMAL HIGH (ref 70–99)
Potassium: 5 mmol/L (ref 3.5–5.1)
Sodium: 141 mmol/L (ref 135–145)
Total Bilirubin: 0.4 mg/dL (ref 0.3–1.2)
Total Protein: 6.5 g/dL (ref 6.5–8.1)

## 2019-10-17 LAB — CBC WITH DIFFERENTIAL (CANCER CENTER ONLY)
Abs Immature Granulocytes: 0.04 10*3/uL (ref 0.00–0.07)
Basophils Absolute: 0 10*3/uL (ref 0.0–0.1)
Basophils Relative: 0 %
Eosinophils Absolute: 0.1 10*3/uL (ref 0.0–0.5)
Eosinophils Relative: 1 %
HCT: 41.4 % (ref 36.0–46.0)
Hemoglobin: 12.8 g/dL (ref 12.0–15.0)
Immature Granulocytes: 0 %
Lymphocytes Relative: 4 %
Lymphs Abs: 0.6 10*3/uL — ABNORMAL LOW (ref 0.7–4.0)
MCH: 31.9 pg (ref 26.0–34.0)
MCHC: 30.9 g/dL (ref 30.0–36.0)
MCV: 103.2 fL — ABNORMAL HIGH (ref 80.0–100.0)
Monocytes Absolute: 0.7 10*3/uL (ref 0.1–1.0)
Monocytes Relative: 5 %
Neutro Abs: 13.3 10*3/uL — ABNORMAL HIGH (ref 1.7–7.7)
Neutrophils Relative %: 90 %
Platelet Count: 233 10*3/uL (ref 150–400)
RBC: 4.01 MIL/uL (ref 3.87–5.11)
RDW: 15.9 % — ABNORMAL HIGH (ref 11.5–15.5)
WBC Count: 14.7 10*3/uL — ABNORMAL HIGH (ref 4.0–10.5)
nRBC: 0 % (ref 0.0–0.2)

## 2019-10-17 MED ORDER — DEXAMETHASONE 4 MG PO TABS: ORAL_TABLET | ORAL | 2 refills | Status: AC

## 2019-10-17 MED ORDER — PROCHLORPERAZINE MALEATE 10 MG PO TABS: 10.0000 mg | ORAL_TABLET | Freq: Four times a day (QID) | ORAL | 2 refills | Status: AC | PRN

## 2019-10-17 MED ORDER — CYANOCOBALAMIN 1000 MCG/ML IJ SOLN
INTRAMUSCULAR | Status: AC
  Filled 2019-10-17: qty 1

## 2019-10-17 MED ORDER — FOLIC ACID 1 MG PO TABS: 1.0000 mg | ORAL_TABLET | Freq: Every day | ORAL | 2 refills | Status: AC

## 2019-10-17 MED ORDER — SODIUM CHLORIDE 0.9% FLUSH
10.0000 mL | INTRAVENOUS | Status: DC | PRN
  Administered 2019-10-17: 10 mL
  Filled 2019-10-17: qty 10

## 2019-10-17 MED ORDER — HEPARIN SOD (PORK) LOCK FLUSH 100 UNIT/ML IV SOLN
500.0000 [IU] | Freq: Once | INTRAVENOUS | Status: AC | PRN
  Administered 2019-10-17: 500 [IU]
  Filled 2019-10-17: qty 5

## 2019-10-17 MED ORDER — CYANOCOBALAMIN 1000 MCG/ML IJ SOLN
1000.0000 ug | Freq: Once | INTRAMUSCULAR | Status: AC
  Administered 2019-10-17: 1000 ug via INTRAMUSCULAR

## 2019-10-17 NOTE — Progress Notes (Signed)
DISCONTINUE ON PATHWAY REGIMEN - Non-Small Cell Lung     A cycle is every 21 days:     Pembrolizumab      Pemetrexed      Carboplatin   **Always confirm dose/schedule in your pharmacy ordering system**  REASON: Disease Progression PRIOR TREATMENT: [id number]: Pembrolizumab 200 mg + Pemetrexed 500 mg/m2 + Carboplatin AUC=5 q21 Days x 4-6 Cycles TREATMENT RESPONSE: Progressive Disease (PD)  START OFF PATHWAY REGIMEN - Non-Small Cell Lung   [id number]:Pemetrexed 500 mg/m2 q21 Days:   A cycle is every 21 days:     Pemetrexed   **Always confirm dose/schedule in your pharmacy ordering system**  Patient Characteristics: Stage IV Metastatic, Nonsquamous, Initial Chemotherapy/Immunotherapy, PS = 0, 1, ALK Rearrangement Negative and ROS1 Rearrangement Negative and NTRK Gene Fusion?Negative and RET Gene Fusion?Negative and EGFR Mutation Negative/Non?Sensitizing, Not a  Candidate for Immunotherapy Therapeutic Status: Stage IV Metastatic Histology: Nonsquamous Cell ROS1 Rearrangement Status: Negative Other Mutations/Biomarkers: No Other Actionable Mutations NTRK Gene Fusion Status: Negative PD-L1 Expression Status: Quantity Not Sufficient Chemotherapy/Immunotherapy LOT: Initial Chemotherapy/Immunotherapy Molecular Targeted Therapy: Not Appropriate MET Exon 14 Mutation Status: Negative RET Gene Fusion Status: Negative ALK Rearrangement Status: Negative EGFR Mutation Status: Negative/Wild Type BRAF [id number] Mutation Status: Negative ECOG Performance Status: 1 Biomarker Assessment Status Confirmation: All Genomic Markers Negative or Only MET+ or BRAF+ Immunotherapy Candidate Status: Not a Candidate for Immunotherapy Intent of Therapy: Non-Curative / Palliative Intent, Discussed with Patient

## 2019-10-17 NOTE — Patient Instructions (Signed)
Summary:  -We covered a lot of important information at your appointment today regarding what the treatment plan is moving forward. Here are the the main points that were discussed at your office visit with [place] today:  -The treatment that you will receive consists of one chemotherapy drug called Alimta (also called Pemetrexed)  -We are planning on starting your treatment next week on 10/24/19.  -Your treatment will be given once every 3 weeks.  -We will get a CT scan after 3 treatments to check on the progress of treatment  Medications:  -I have sent a few important medication prescriptions to your pharmacy.  -Compazine was sent to your pharmacy. This medication is for nausea. You may take this every 6 hours as needed if you feel nauseous.  -I have also sent a prescription for 1 mg of folic acid to your pharmacy. We need you to take 1 tablet every day.  -We will administer vitamin B12 every 9 weeks while you are here in the clinic. You have received your first dose today.  -I have sent decadron (4 mg) tablet to the pharmacy. I want you to take one tablet Twice a day the day before, the day of, and the day after chemotherapy.   Referrals or Imaging: -I have placed a referral to [facility]. If they are not in network then we can try [facility]   Follow up:  -We will see you back for a follow up visit 1 week after your first treatment to see how it went and help manage any side effects of treatment that you may have   -If you need to reach [place] at any time, the main office number to the cancer center is [phone], when you call, ask to speak to either Cassie's or Dr. Worthy Flank nurse.

## 2019-10-18 ENCOUNTER — Encounter: Payer: Self-pay | Admitting: *Deleted

## 2019-10-18 LAB — TSH: TSH: 1.555 u[IU]/mL (ref 0.308–3.960)

## 2019-10-18 NOTE — Progress Notes (Signed)
Confirmed w/[employer] that Dr. Michell Heinrich at [facility] is in network. Notified HIM to please process the referral now for 2nd opinion.

## 2019-10-21 ENCOUNTER — Telehealth: Payer: Self-pay | Admitting: Internal Medicine

## 2019-10-21 ENCOUNTER — Telehealth: Payer: Self-pay

## 2019-10-21 NOTE — Telephone Encounter (Deleted)
   Release:  [id number]

## 2019-10-21 NOTE — Telephone Encounter (Signed)
Printed last 5 OV notes for referral for Dr.William jeffrey petty will be faxed to [phone]  Release:  [id number]

## 2019-10-21 NOTE — Telephone Encounter (Signed)
Called and spoke with patient. Confirmed upcoming appts

## 2019-10-21 NOTE — Telephone Encounter (Signed)
Received TC from pt stating that she was supposed to be scheduled for her first chemo treatment this thursday 10/24/19 however it was never scheduled. Sent high priority schedule message to schedulers to get this situation followed up on and scheduled for patient. Schedule message sent.

## 2019-10-22 ENCOUNTER — Telehealth: Payer: Self-pay | Admitting: Medical Oncology

## 2019-10-22 NOTE — Telephone Encounter (Signed)
Pt with Dr Mindi Junker 10/29/19

## 2019-10-24 ENCOUNTER — Inpatient Hospital Stay: Payer: PPO

## 2019-10-24 ENCOUNTER — Other Ambulatory Visit: Payer: Self-pay

## 2019-10-24 VITALS — BP 152/67 | HR 98 | Temp 98.9°F | Resp 18 | Wt 159.8 lb

## 2019-10-24 DIAGNOSIS — Z5112 Encounter for antineoplastic immunotherapy: Secondary | ICD-10-CM | POA: Diagnosis not present

## 2019-10-24 DIAGNOSIS — Z95828 Presence of other vascular implants and grafts: Secondary | ICD-10-CM

## 2019-10-24 DIAGNOSIS — C3491 Malignant neoplasm of unspecified part of right bronchus or lung: Secondary | ICD-10-CM

## 2019-10-24 DIAGNOSIS — E860 Dehydration: Secondary | ICD-10-CM

## 2019-10-24 DIAGNOSIS — G4733 Obstructive sleep apnea (adult) (pediatric): Secondary | ICD-10-CM | POA: Diagnosis not present

## 2019-10-24 LAB — CBC WITH DIFFERENTIAL (CANCER CENTER ONLY)
Abs Immature Granulocytes: 0.24 10*3/uL — ABNORMAL HIGH (ref 0.00–0.07)
Basophils Absolute: 0.1 10*3/uL (ref 0.0–0.1)
Basophils Relative: 0 %
Eosinophils Absolute: 0 10*3/uL (ref 0.0–0.5)
Eosinophils Relative: 0 %
HCT: 42 % (ref 36.0–46.0)
Hemoglobin: 13.1 g/dL (ref 12.0–15.0)
Immature Granulocytes: 1 %
Lymphocytes Relative: 4 %
Lymphs Abs: 1 10*3/uL (ref 0.7–4.0)
MCH: 31.4 pg (ref 26.0–34.0)
MCHC: 31.2 g/dL (ref 30.0–36.0)
MCV: 100.7 fL — ABNORMAL HIGH (ref 80.0–100.0)
Monocytes Absolute: 1.1 10*3/uL — ABNORMAL HIGH (ref 0.1–1.0)
Monocytes Relative: 4 %
Neutro Abs: 26.6 10*3/uL — ABNORMAL HIGH (ref 1.7–7.7)
Neutrophils Relative %: 91 %
Platelet Count: 269 10*3/uL (ref 150–400)
RBC: 4.17 MIL/uL (ref 3.87–5.11)
RDW: 15 % (ref 11.5–15.5)
WBC Count: 29.1 10*3/uL — ABNORMAL HIGH (ref 4.0–10.5)
nRBC: 0 % (ref 0.0–0.2)

## 2019-10-24 LAB — CMP (CANCER CENTER ONLY)
ALT: 8 U/L (ref 0–44)
AST: 8 U/L — ABNORMAL LOW (ref 15–41)
Albumin: 2.6 g/dL — ABNORMAL LOW (ref 3.5–5.0)
Alkaline Phosphatase: 74 U/L (ref 38–126)
Anion gap: 10 (ref 5–15)
BUN: 41 mg/dL — ABNORMAL HIGH (ref 8–23)
CO2: 29 mmol/L (ref 22–32)
Calcium: 10.5 mg/dL — ABNORMAL HIGH (ref 8.9–10.3)
Chloride: 101 mmol/L (ref 98–111)
Creatinine: 1.25 mg/dL — ABNORMAL HIGH (ref 0.44–1.00)
GFR, Est AFR Am: 48 mL/min — ABNORMAL LOW (ref >60)
GFR, Estimated: 42 mL/min — ABNORMAL LOW (ref >60)
Glucose, Bld: 151 mg/dL — ABNORMAL HIGH (ref 70–99)
Potassium: 4.7 mmol/L (ref 3.5–5.1)
Sodium: 140 mmol/L (ref 135–145)
Total Bilirubin: <0.2 mg/dL — ABNORMAL LOW (ref 0.3–1.2)
Total Protein: 7.1 g/dL (ref 6.5–8.1)

## 2019-10-24 MED ORDER — SODIUM CHLORIDE 0.9 % IV SOLN
Freq: Once | INTRAVENOUS | Status: AC
  Filled 2019-10-24: qty 250

## 2019-10-24 MED ORDER — PROCHLORPERAZINE MALEATE 10 MG PO TABS
ORAL_TABLET | ORAL | Status: AC
  Filled 2019-10-24: qty 1

## 2019-10-24 MED ORDER — SODIUM CHLORIDE 0.9 % IV SOLN
500.0000 mg/m2 | Freq: Once | INTRAVENOUS | Status: AC
  Administered 2019-10-24: 900 mg via INTRAVENOUS
  Filled 2019-10-24: qty 20

## 2019-10-24 MED ORDER — SODIUM CHLORIDE 0.9 % IV SOLN: Freq: Once | INTRAVENOUS | Status: DC

## 2019-10-24 MED ORDER — SODIUM CHLORIDE 0.9 % IV SOLN
1000.0000 mL | Freq: Once | INTRAVENOUS | Status: AC
  Administered 2019-10-24: 1000 mL via INTRAVENOUS
  Filled 2019-10-24: qty 1000

## 2019-10-24 MED ORDER — PROCHLORPERAZINE MALEATE 10 MG PO TABS
10.0000 mg | ORAL_TABLET | Freq: Once | ORAL | Status: AC
  Administered 2019-10-24: 10 mg via ORAL

## 2019-10-24 MED ORDER — SODIUM CHLORIDE 0.9% FLUSH
10.0000 mL | INTRAVENOUS | Status: DC | PRN
  Administered 2019-10-24: 10 mL
  Filled 2019-10-24: qty 10

## 2019-10-24 MED ORDER — HEPARIN SOD (PORK) LOCK FLUSH 100 UNIT/ML IV SOLN
500.0000 [IU] | Freq: Once | INTRAVENOUS | Status: AC | PRN
  Administered 2019-10-24: 500 [IU]
  Filled 2019-10-24: qty 5

## 2019-10-24 NOTE — Progress Notes (Signed)
Spoke with Dr. [name] and he would like to keep Alimta dose the same for now despite slight change in renal function. MD would like patient to receive 1 L 1L NS for hydration. MD also aware of Corrected Calcium being 11.6   Larene Beach, PharmD

## 2019-10-24 NOTE — Patient Instructions (Signed)

## 2019-10-24 NOTE — Patient Instructions (Addendum)
[facility] Discharge Instructions for Patients Receiving Chemotherapy  Today you received the following chemotherapy agents Keytruda  To help prevent nausea and vomiting after your treatment, we encourage you to take your nausea medication as directed.    If you develop nausea and vomiting that is not controlled by your nausea medication, call the clinic.   BELOW ARE SYMPTOMS THAT SHOULD BE REPORTED IMMEDIATELY:  *FEVER GREATER THAN 100.5 F  *CHILLS WITH OR WITHOUT FEVER  NAUSEA AND VOMITING THAT IS NOT CONTROLLED WITH YOUR NAUSEA MEDICATION  *UNUSUAL SHORTNESS OF BREATH  *UNUSUAL BRUISING OR BLEEDING  TENDERNESS IN MOUTH AND THROAT WITH OR WITHOUT PRESENCE OF ULCERS  *URINARY PROBLEMS  *BOWEL PROBLEMS  UNUSUAL RASH Items with * indicate a potential emergency and should be followed up as soon as possible.  Feel free to call the clinic should you have any questions or concerns. The clinic phone number is [phone].  Please show the [place] at check-in to the Emergency Department and triage nurse.    Rehydration, Adult Rehydration is the replacement of body fluids and salts and minerals (electrolytes) that are lost during dehydration. Dehydration is when there is not enough fluid or water in the body. This happens when you lose more fluids than you take in. Common causes of dehydration include:  Vomiting.  Diarrhea.  Excessive sweating, such as from heat exposure or exercise.  Taking medicines that cause the body to lose excess fluid (diuretics).  Impaired kidney function.  Not drinking enough fluid.  Certain illnesses or infections.  Certain poorly controlled long-term (chronic) illnesses, such as diabetes, heart disease, and kidney disease.  Symptoms of mild dehydration may include thirst, dry lips and mouth, dry skin, and dizziness. Symptoms of severe dehydration may include increased heart rate, confusion, fainting, and not  urinating. You can rehydrate by drinking certain fluids or getting fluids through an IV tube, as told by your health care provider. What are the risks? Generally, rehydration is safe. However, one problem that can happen is taking in too much fluid (overhydration). This is rare. If overhydration happens, it can cause an electrolyte imbalance, kidney failure, or a decrease in salt (sodium) levels in the body. How to rehydrate Follow instructions from your health care provider for rehydration. The kind of fluid you should drink and the amount you should drink depend on your condition.  If directed by your health care provider, drink an oral rehydration solution (ORS). This is a drink designed to treat dehydration that is found in pharmacies and retail stores. ? Make an ORS by following instructions on the package. ? Start by drinking small amounts, about  cup (120 mL) every 5-10 minutes. ? Slowly increase how much you drink until you have taken the amount recommended by your health care provider.  Drink enough clear fluids to keep your urine clear or pale yellow. If you were instructed to drink an ORS, finish the ORS first, then start slowly drinking other clear fluids. Drink fluids such as: ? Water. Do not drink only water. Doing that can lead to having too little sodium in your body (hyponatremia). ? Ice chips. ? Fruit juice that you have added water to (diluted juice). ? Low-calorie sports drinks.  If you are severely dehydrated, your health care provider may recommend that you receive fluids through an IV tube in the hospital.  Do not take sodium tablets. Doing that can lead to the condition of having too much sodium in your body (  hypernatremia). Eating while you rehydrate Follow instructions from your health care provider about what to eat while you rehydrate. Your health care provider may recommend that you slowly begin eating regular foods in small amounts.  Eat foods that contain a  healthy balance of electrolytes, such as bananas, oranges, potatoes, tomatoes, and spinach.  Avoid foods that are greasy or contain a lot of fat or sugar.  In some cases, you may get nutrition through a feeding tube that is passed through your nose and into your stomach (nasogastric tube, or NG tube). This may be done if you have uncontrolled vomiting or diarrhea. Beverages to avoid Certain beverages may make dehydration worse. While you rehydrate, avoid:  Alcohol.  Caffeine.  Drinks that contain a lot of sugar. These include: ? High-calorie sports drinks. ? Fruit juice that is not diluted. ? Soda.  Check nutrition labels to see how much sugar or caffeine a beverage contains. Signs of dehydration recovery You may be recovering from dehydration if:  You are urinating more often than before you started rehydrating.  Your urine is clear or pale yellow.  Your energy level improves.  You vomit less frequently.  You have diarrhea less frequently.  Your appetite improves or returns to normal.  You feel less dizzy or less light-headed.  Your skin tone and color start to look more normal. Contact a health care provider if:  You continue to have symptoms of mild dehydration, such as: ? Thirst. ? Dry lips. ? Slightly dry mouth. ? Dry, warm skin. ? Dizziness.  You continue to vomit or have diarrhea. Get help right away if:  You have symptoms of dehydration that get worse.  You feel: ? Confused. ? Weak. ? Like you are going to faint.  You have not urinated in 6-8 hours.  You have very dark urine.  You have trouble breathing.  Your heart rate while sitting still is over 100 beats a minute.  You cannot drink fluids without vomiting.  You have vomiting or diarrhea that: ? Gets worse. ? Does not go away.  You have a fever. This information is not intended to replace advice given to you by your health care provider. Make sure you discuss any questions you have with  your health care provider. Document Revised: 02/10/2017 Document Reviewed: 04/24/2015 Elsevier Patient Education  2020 [employer].

## 2019-10-29 ENCOUNTER — Telehealth: Payer: Self-pay | Admitting: Adult Health

## 2019-10-29 NOTE — Telephone Encounter (Signed)
Called and spoke to pt's husband. He states pts spo2 has been between 89-91% on 8lpm. Pt had one episode of hypoxia at 87% when pt went to bathroom. After pt rested spo2 returned to baseline quickly. Pt denies any complaints aside from her chronic cough. Advised pt and husband to call office back or seek emergency care if any sudden worsening in s/s.  Will forward to TP as FYI.

## 2019-10-29 NOTE — Telephone Encounter (Signed)
Last seen in office 09/2019 , known lung cancer dx 2019 . On tx with   Call from [facility] [place] with patient  Patient with increased oxygen demands . O2 sats in 80s on 8 l/m  Advised to increase to 9-10 L/ to keep O2 sats >88% .  Advised to go to ER for respiratory distress. Says she is comfortable with NAD.  No increased cough /congestion . No fever, edema.   Please call and check on patient to see if doing better. No pulmonary f/up on chart, please set up f/up visit  Or work in visit to evaluate.   Please contact office for sooner follow up if symptoms do not improve or worsen or seek emergency care

## 2019-10-30 NOTE — Progress Notes (Deleted)
[facility] OFFICE PROGRESS NOTE  Erin Battles, MD [place] [address]  DIAGNOSIS:  1) Stage IV (T3, N0, M1 a)non-small cell lung cancer, well-differentiated adenocarcinoma presented with multifocal disease involving mainly the right upper lobe as well as the right middle lobe and left lower lobe of the lung diagnosed in May 2019. 2) bilateral small pulmonary emboli diagnosed incidentally on CT scan of the chest on October 16, 2017  Guardant 360 testing was negative for actionable mutations.  PRIOR THERAPY: 1) Carboplatin for an AUC of 5, Alimta 500 mg/m2 and Keytruda 200 mg IV given every 3 weeks. First dosegiven on 08/18/2017.Starting maintenance alimta and Keytruda from cycle #5. She took a break after cycle #15 for several months.Status post23cycles.Starting from cycle #16, she resumed treatment with single agent Keytruda on 02/12/2019.Treatment on hold 07/09/2019 due to suspicious pneumonitis 2) Xarelto 15 mg p.o. twice daily for the first 3 weeks followed by 20 mg p.o. Daily.  CURRENT THERAPY: Single agent Alimta 500 mg per metered squared IV every 3 weeks.  First dose expected on 10/24/2019. Status post 1 cycle.   INTERVAL HISTORY: Erin Myers 79 y.o. female returns to the clinic today for a follow up visit. The patient is feeling _ today. The patient recently started treatment with single agent Alimta. She has a second opinion at [facility] by Dr. Mindi Junker. They discussed __.   The patient underwent her first cycle of chemotherapy with Alimta last week and tolerated it __. Since being discharged, the patient is still on oxygen via nasal cannula.  She is currently on 7 L of oxygen or more if exerting herself. She denies any recent fever, chills, night sweats, or weight loss.  She reports her baseline dyspnea on exertion and chronic cough which produces clear "watery" sputum.  She denies any nausea, vomiting, or constipation. She denies any  headache or visual changes.  The patient is here today for evaluation and a one week follow up visit.   MEDICAL HISTORY: Past Medical History:  Diagnosis Date  . Anxiety   . Diabetes mellitus without complication ([place])   . Dyslipidemia   . HTN (hypertension)   . Hyperlipemia   . Hypothyroid   . Hypothyroidism   . Lung cancer ([place]) 2000   Carcinoid, s/p RLL lobectomy  . Obesity   . Ovarian cancer ([place])   . Palpitations 2009 and 2006   Negative nuclear stress test   . Sleep apnea    on C-pap    ALLERGIES:  is allergic to pembrolizumab, adhesive [tape], codeine, lactose intolerance (gi), monosodium glutamate, and sulfa antibiotics.  MEDICATIONS:  Current Outpatient Medications  Medication Sig Dispense Refill  . amLODipine (NORVASC) 5 MG tablet Take 1 tablet (5 mg total) by mouth daily. 30 tablet 0  . BREZTRI AEROSPHERE 160-9-4.8 MCG/ACT AERO Take 2 puffs by mouth 2 (two) times daily.    . clorazepate (TRANXENE) 7.5 MG tablet Take 3.75 mg by mouth in the morning and at bedtime.     [name] dexamethasone (DECADRON) 4 MG tablet Please take 1 tablet twice a day the day before, the day of, and the day after chemo therapy. 40 tablet 2  . docusate sodium (COLACE) 100 MG capsule Take 1 capsule (100 mg total) by mouth 2 (two) times daily. 10 capsule   . folic acid (FOLVITE) 1 MG tablet Take 1 tablet (1 mg total) by mouth daily. 30 tablet 2  . guaiFENesin-dextromethorphan (ROBITUSSIN DM) 100-10 MG/5ML syrup Take  5 mLs by mouth every 4 (four) hours as needed for cough. 118 mL 0  . ipratropium-albuterol (DUONEB) 0.5-2.5 (3) MG/3ML SOLN INHALE THE CONTENTS OF 1 VIAL VIA NEBULIZER 3 TIMES A DAY. (Patient taking differently: Inhale 3 mLs into the lungs 3 (three) times daily as needed. ) 240 mL 11  . levothyroxine (SYNTHROID) 112 MCG tablet Take 112 mcg by mouth daily.    . metoprolol succinate (TOPROL-XL) 50 MG 24 hr tablet Take 50 mg by mouth 2 (two) times daily.     . Multiple Vitamins-Minerals  (VISION FORMULA 2 PO) Take 1 tablet by mouth daily. OTC "Vision pills for Macular degeneration"     . predniSONE (DELTASONE) 20 MG tablet Prednisone 30 mg daily for 3 days followed by  Prednisone 20 mg daily for 3 days followed by  Prednisone 10 mg daily for 3 days. 10 tablet 0  . prochlorperazine (COMPAZINE) 10 MG tablet Take 1 tablet (10 mg total) by mouth every 6 (six) hours as needed. 30 tablet 2  . rosuvastatin (CRESTOR) 10 MG tablet Take 10 mg by mouth every evening.     . valACYclovir (VALTREX) 500 MG tablet Take 500 mg by mouth daily.    [name] 20 MG TABS tablet TAKE 1 TABLET BY MOUTH DAILY WITH SUPPER. (Patient taking differently: Take 20 mg by mouth daily with supper. ) 30 tablet 1   No current facility-administered medications for this visit.    SURGICAL HISTORY:  Past Surgical History:  Procedure Laterality Date  . ABDOMINAL HYSTERECTOMY  2001  . BRONCHIAL BIOPSY  10/04/2019   Procedure: BRONCHIAL BIOPSIES;  Surgeon: Marshell Garfinkel, MD;  Location: [facility] END[facility]COPY;  Service: Cardiopulmonary;;  . BRONCHIAL BRUSHINGS  10/04/2019   Procedure: BRONCHIAL BRUSHINGS;  Surgeon: Marshell Garfinkel, MD;  Location: [facility] ENDOSCOPY;  Service: Cardiopulmonary;;  . GRADED EXERCISE TOLERANCE TEST  08/2015    Blood pressure demonstrated a hypertensive response to exercise.  There was less than 0.64mm of horizontal ST segment depression in the inferolateral leads. No diagnostic criterial for ischemia. Moderately impaired exercise tolerance. The patient achieved 5.8 mets. This is a low risk study.  . IR FLUORO GUIDE PORT INSERTION RIGHT  08/17/2017  . IR THORACENTESIS ASP PLEURAL SPACE W/IMG GUIDE  07/04/2018  . IR US GUIDE VASC ACCESS RIGHT  08/17/2017  . LOBECTOMY  2000   RLL  . NM MYOCAR PERF WALL MOTION  08/20/2007   EF 74%  EXERCISE CAPACITY 7 METS  . OOPHORECTOMY  2001  . TONSILLECTOMY    . VIDEO BRONCHOSCOPY Bilateral 07/20/2017   Procedure: VIDEO BRONCHOSCOPY WITH FLUORO;  Surgeon: Collene Gobble,  MD;  Location: [facility];  Service: Cardiopulmonary;  Laterality: Bilateral;  . VIDEO BRONCHOSCOPY N/A 10/04/2019   Procedure: VIDEO BRONCHOSCOPY WITH FLUORO;  Surgeon: Marshell Garfinkel, MD;  Location: [facility];  Service: Cardiopulmonary;  Laterality: N/A;    REVIEW OF SYSTEMS:   Review of Systems  Constitutional: Negative for appetite change, chills, fatigue, fever and unexpected weight change.  HENT:   Negative for mouth sores, nosebleeds, sore throat and trouble swallowing.   Eyes: Negative for eye problems and icterus.  Respiratory: Negative for cough, hemoptysis, shortness of breath and wheezing.   Cardiovascular: Negative for chest pain and leg swelling.  Gastrointestinal: Negative for abdominal pain, constipation, diarrhea, nausea and vomiting.  Genitourinary: Negative for bladder incontinence, difficulty urinating, dysuria, frequency and hematuria.   Musculoskeletal: Negative for back pain, gait problem, neck pain and neck stiffness.  Skin: Negative for  itching and rash.  Neurological: Negative for dizziness, extremity weakness, gait problem, headaches, light-headedness and seizures.  Hematological: Negative for adenopathy. Does not bruise/bleed easily.  Psychiatric/Behavioral: Negative for confusion, depression and sleep disturbance. The patient is not nervous/anxious.     PHYSICAL EXAMINATION:  There were no vitals taken for this visit.  ECOG PERFORMANCE STATUS: {CHL ONC ECOG [id number]  Physical Exam  Constitutional: Oriented to person, place, and time and well-developed, well-nourished, and in no distress. No distress.  HENT:  Head: Normocephalic and atraumatic.  Mouth/Throat: Oropharynx is clear and moist. No oropharyngeal exudate.  Eyes: Conjunctivae are normal. Right eye exhibits no discharge. Left eye exhibits no discharge. No scleral icterus.  Neck: Normal range of motion. Neck supple.  Cardiovascular: Normal rate, regular rhythm, normal heart sounds and  intact distal pulses.   Pulmonary/Chest: Effort normal and breath sounds normal. No respiratory distress. No wheezes. No rales.  Abdominal: Soft. Bowel sounds are normal. Exhibits no distension and no mass. There is no tenderness.  Musculoskeletal: Normal range of motion. Exhibits no edema.  Lymphadenopathy:    No cervical adenopathy.  Neurological: Alert and oriented to person, place, and time. Exhibits normal muscle tone. Gait normal. Coordination normal.  Skin: Skin is warm and dry. No rash noted. Not diaphoretic. No erythema. No pallor.  Psychiatric: Mood, memory and judgment normal.  Vitals reviewed.  LABORATORY DATA: Lab Results  Component Value Date   WBC 29.1 (H) 10/24/2019   HGB 13.1 10/24/2019   HCT 42.0 10/24/2019   MCV 100.7 (H) 10/24/2019   PLT 269 10/24/2019      Chemistry      Component Value Date/Time   NA 140 10/24/2019 1403   K 4.7 10/24/2019 1403   CL 101 10/24/2019 1403   CO2 29 10/24/2019 1403   BUN 41 (H) 10/24/2019 1403   CREATININE 1.25 (H) 10/24/2019 1403      Component Value Date/Time   CALCIUM 10.5 (H) 10/24/2019 1403   ALKPHOS 74 10/24/2019 1403   AST 8 (L) 10/24/2019 1403   ALT 8 10/24/2019 1403   BILITOT <0.2 (L) 10/24/2019 1403       RADIOGRAPHIC STUDIES:  DG CHEST PORT 1 VIEW  Result Date: 10/06/2019 CLINICAL DATA:  Respiratory distress, oxygen desaturation EXAM: PORTABLE CHEST 1 VIEW COMPARISON:  10/05/2019 FINDINGS: Slight interval increase in heterogeneous airspace opacity of the right lung, particularly in the right upper lobe. Unchanged heterogeneous opacity elsewhere, with small, layering bilateral pleural effusions. Right chest port catheter. Heart and mediastinum are unremarkable. IMPRESSION: 1. Slight interval increase in heterogeneous airspace opacity of the right lung, particularly in the right upper lobe, consistent with worsened edema and/or infection. 2. Unchanged heterogeneous opacity elsewhere, with small, layering bilateral  pleural effusions. Electronically Signed   By: Eddie Candle M.D.   On: 10/06/2019 10:09   DG CHEST PORT 1 VIEW  Result Date: 10/05/2019 CLINICAL DATA:  Acute respiratory distress. EXAM: PORTABLE CHEST 1 VIEW COMPARISON:  10/04/2019 FINDINGS: Right IJ Port-A-Cath unchanged. Lungs are adequately inflated demonstrate persistent hazy airspace opacification over the mid to lower lungs bilaterally and right upper lobe likely due to multifocal infection. Cardiomediastinal silhouette and remainder of the exam is unchanged. IMPRESSION: Persistent bilateral hazy airspace process unchanged and likely due to multifocal infection. Right IJ Port-A-Cath unchanged. Electronically Signed   By: Marin Olp M.D.   On: 10/05/2019 12:04   DG CHEST PORT 1 VIEW  Result Date: 10/04/2019 CLINICAL DATA:  Status post bronchoscopy with biopsy EXAM: PORTABLE CHEST  1 VIEW COMPARISON:  09/30/2019, CT chest 09/30/2019 FINDINGS: Right-sided central venous port tip over the right atrium. Progression of ground-glass disease in the right thorax. Patchy consolidations and ground-glass opacities on the left are grossly unchanged. No pleural effusion or pneumothorax is seen. Stable cardiomediastinal silhouette. IMPRESSION: Progression of ground-glass disease in the right thorax. Grossly stable patchy consolidations and ground-glass opacities in the left lung. Electronically Signed   By: Donavan Foil M.D.   On: 10/04/2019 17:52   DG C-ARM BRONCHOSCOPY  Result Date: 10/04/2019 C-ARM BRONCHOSCOPY: Fluoroscopy was utilized by the requesting physician.  No radiographic interpretation.     ASSESSMENT/PLAN:  This is a very pleasant 79 year old Caucasian female diagnosed with stage IV non-small cell lung cancer, well differentiated adenocarcinoma. She presented with multifocal disease involving mainly the right upper lobe as well as right middle lobe and left lower lobe. She was diagnosed in May 2019. She has no actionable  mutations.  The patient was undergoing treatment with systemic chemotherapy with carboplatin, Alimta, and Keytruda.  Starting from cycle #5 she started on maintenance Alimta and Keytruda. Alimta was discontinued due to intolerance with fatigue. She requested to take a break after cycle #15.  She received her treatment with single agent agent Keytruda in December 2020 with cycle #16.  She is status post 23 cycles of treatment.   The patient was recently hospitalized for acute on chronic respiratory failure.  She had a bronchoscopy performed which was consistent with bilateral adenocarcinoma.  She is currently undergoing single agent Alimta 500 mg/m2. She is status post 1 cycle. She is not a candidate for docetaxel and Cyramza due to her poor performance status. She is seeing Dr. Mindi Junker at [facility] for a second opinion.   The patient was seen with Dr. [name]. Labs were reviewed. Recommend that she _.   We will see her back for a follow up visit in 2 weeks for evaluation before starting cycle #2.   She will continue to follow with pulmonology regarding her COPD.   The patient was advised to call immediately if she has any concerning symptoms in the interval. The patient voices understanding of current disease status and treatment options and is in agreement with the current care plan. All questions were answered. The patient knows to call the clinic with any problems, questions or concerns. We can certainly see the patient much sooner if necessary       No orders of the defined types were placed in this encounter.    Keaundre Thelin L Muranda Coye, PA-C 10/30/19

## 2019-10-31 ENCOUNTER — Emergency Department (HOSPITAL_COMMUNITY): Payer: PPO

## 2019-10-31 ENCOUNTER — Telehealth: Payer: Self-pay

## 2019-10-31 ENCOUNTER — Encounter (HOSPITAL_COMMUNITY): Payer: Self-pay

## 2019-10-31 ENCOUNTER — Inpatient Hospital Stay (HOSPITAL_COMMUNITY)
Admission: EM | Admit: 2019-10-31 | Discharge: 2019-11-07 | DRG: 871 | Disposition: A | Discharge status: Hospice | Payer: PPO | Attending: Internal Medicine | Admitting: Internal Medicine

## 2019-10-31 ENCOUNTER — Inpatient Hospital Stay: Payer: PPO | Admitting: Physician Assistant

## 2019-10-31 ENCOUNTER — Other Ambulatory Visit: Payer: Self-pay

## 2019-10-31 DIAGNOSIS — J189 Pneumonia, unspecified organism: Secondary | ICD-10-CM

## 2019-10-31 DIAGNOSIS — Z8543 Personal history of malignant neoplasm of ovary: Secondary | ICD-10-CM

## 2019-10-31 DIAGNOSIS — Z90710 Acquired absence of both cervix and uterus: Secondary | ICD-10-CM

## 2019-10-31 DIAGNOSIS — Z20822 Contact with and (suspected) exposure to covid-19: Secondary | ICD-10-CM | POA: Diagnosis not present

## 2019-10-31 DIAGNOSIS — D701 Agranulocytosis secondary to cancer chemotherapy: Secondary | ICD-10-CM

## 2019-10-31 DIAGNOSIS — R6520 Severe sepsis without septic shock: Secondary | ICD-10-CM | POA: Diagnosis not present

## 2019-10-31 DIAGNOSIS — G4733 Obstructive sleep apnea (adult) (pediatric): Secondary | ICD-10-CM

## 2019-10-31 DIAGNOSIS — F419 Anxiety disorder, unspecified: Secondary | ICD-10-CM | POA: Diagnosis present

## 2019-10-31 DIAGNOSIS — R0602 Shortness of breath: Secondary | ICD-10-CM | POA: Diagnosis not present

## 2019-10-31 DIAGNOSIS — R0989 Other specified symptoms and signs involving the circulatory and respiratory systems: Secondary | ICD-10-CM

## 2019-10-31 DIAGNOSIS — C3490 Malignant neoplasm of unspecified part of unspecified bronchus or lung: Secondary | ICD-10-CM

## 2019-10-31 DIAGNOSIS — E875 Hyperkalemia: Secondary | ICD-10-CM | POA: Diagnosis not present

## 2019-10-31 DIAGNOSIS — Z66 Do not resuscitate: Secondary | ICD-10-CM

## 2019-10-31 DIAGNOSIS — R000 Tachycardia, unspecified: Secondary | ICD-10-CM | POA: Diagnosis not present

## 2019-10-31 DIAGNOSIS — R21 Rash and other nonspecific skin eruption: Secondary | ICD-10-CM

## 2019-10-31 DIAGNOSIS — Z515 Encounter for palliative care: Secondary | ICD-10-CM | POA: Diagnosis not present

## 2019-10-31 DIAGNOSIS — J9621 Acute and chronic respiratory failure with hypoxia: Secondary | ICD-10-CM

## 2019-10-31 DIAGNOSIS — J9601 Acute respiratory failure with hypoxia: Secondary | ICD-10-CM | POA: Diagnosis not present

## 2019-10-31 DIAGNOSIS — Z87891 Personal history of nicotine dependence: Secondary | ICD-10-CM

## 2019-10-31 DIAGNOSIS — R778 Other specified abnormalities of plasma proteins: Secondary | ICD-10-CM | POA: Diagnosis not present

## 2019-10-31 DIAGNOSIS — Z7901 Long term (current) use of anticoagulants: Secondary | ICD-10-CM | POA: Diagnosis not present

## 2019-10-31 DIAGNOSIS — E1165 Type 2 diabetes mellitus with hyperglycemia: Secondary | ICD-10-CM | POA: Diagnosis not present

## 2019-10-31 DIAGNOSIS — E119 Type 2 diabetes mellitus without complications: Secondary | ICD-10-CM

## 2019-10-31 DIAGNOSIS — R0902 Hypoxemia: Secondary | ICD-10-CM | POA: Diagnosis not present

## 2019-10-31 DIAGNOSIS — R739 Hyperglycemia, unspecified: Secondary | ICD-10-CM

## 2019-10-31 DIAGNOSIS — R05 Cough: Secondary | ICD-10-CM | POA: Diagnosis not present

## 2019-10-31 DIAGNOSIS — N179 Acute kidney failure, unspecified: Secondary | ICD-10-CM | POA: Diagnosis not present

## 2019-10-31 DIAGNOSIS — I10 Essential (primary) hypertension: Secondary | ICD-10-CM | POA: Diagnosis not present

## 2019-10-31 DIAGNOSIS — E1142 Type 2 diabetes mellitus with diabetic polyneuropathy: Secondary | ICD-10-CM | POA: Diagnosis not present

## 2019-10-31 DIAGNOSIS — J90 Pleural effusion, not elsewhere classified: Secondary | ICD-10-CM | POA: Diagnosis not present

## 2019-10-31 DIAGNOSIS — Z823 Family history of stroke: Secondary | ICD-10-CM

## 2019-10-31 DIAGNOSIS — R5381 Other malaise: Secondary | ICD-10-CM | POA: Diagnosis not present

## 2019-10-31 DIAGNOSIS — Z9981 Dependence on supplemental oxygen: Secondary | ICD-10-CM

## 2019-10-31 DIAGNOSIS — Z85118 Personal history of other malignant neoplasm of bronchus and lung: Secondary | ICD-10-CM | POA: Diagnosis not present

## 2019-10-31 DIAGNOSIS — Z86711 Personal history of pulmonary embolism: Secondary | ICD-10-CM | POA: Diagnosis not present

## 2019-10-31 DIAGNOSIS — E785 Hyperlipidemia, unspecified: Secondary | ICD-10-CM | POA: Diagnosis present

## 2019-10-31 DIAGNOSIS — T451X5A Adverse effect of antineoplastic and immunosuppressive drugs, initial encounter: Secondary | ICD-10-CM

## 2019-10-31 DIAGNOSIS — A419 Sepsis, unspecified organism: Principal | ICD-10-CM | POA: Diagnosis present

## 2019-10-31 DIAGNOSIS — Z8701 Personal history of pneumonia (recurrent): Secondary | ICD-10-CM

## 2019-10-31 DIAGNOSIS — Z9989 Dependence on other enabling machines and devices: Secondary | ICD-10-CM

## 2019-10-31 DIAGNOSIS — J811 Chronic pulmonary edema: Secondary | ICD-10-CM | POA: Diagnosis not present

## 2019-10-31 DIAGNOSIS — E039 Hypothyroidism, unspecified: Secondary | ICD-10-CM | POA: Diagnosis not present

## 2019-10-31 DIAGNOSIS — E871 Hypo-osmolality and hyponatremia: Secondary | ICD-10-CM

## 2019-10-31 DIAGNOSIS — Z79890 Hormone replacement therapy (postmenopausal): Secondary | ICD-10-CM

## 2019-10-31 DIAGNOSIS — Z7189 Other specified counseling: Secondary | ICD-10-CM | POA: Diagnosis not present

## 2019-10-31 DIAGNOSIS — Z8249 Family history of ischemic heart disease and other diseases of the circulatory system: Secondary | ICD-10-CM

## 2019-10-31 DIAGNOSIS — Z79899 Other long term (current) drug therapy: Secondary | ICD-10-CM

## 2019-10-31 LAB — CBC WITH DIFFERENTIAL/PLATELET
Abs Immature Granulocytes: 0.14 10*3/uL — ABNORMAL HIGH (ref 0.00–0.07)
Basophils Absolute: 0 10*3/uL (ref 0.0–0.1)
Basophils Relative: 1 %
Eosinophils Absolute: 0 10*3/uL (ref 0.0–0.5)
Eosinophils Relative: 0 %
HCT: 40.7 % (ref 36.0–46.0)
Hemoglobin: 13 g/dL (ref 12.0–15.0)
Immature Granulocytes: 7 %
Lymphocytes Relative: 14 %
Lymphs Abs: 0.3 10*3/uL — ABNORMAL LOW (ref 0.7–4.0)
MCH: 31.6 pg (ref 26.0–34.0)
MCHC: 31.9 g/dL (ref 30.0–36.0)
MCV: 99 fL (ref 80.0–100.0)
Monocytes Absolute: 0.1 10*3/uL (ref 0.1–1.0)
Monocytes Relative: 4 %
Neutro Abs: 1.4 10*3/uL — ABNORMAL LOW (ref 1.7–7.7)
Neutrophils Relative %: 74 %
Platelets: 140 10*3/uL — ABNORMAL LOW (ref 150–400)
RBC: 4.11 MIL/uL (ref 3.87–5.11)
RDW: 14 % (ref 11.5–15.5)
WBC: 1.9 10*3/uL — ABNORMAL LOW (ref 4.0–10.5)
nRBC: 0 % (ref 0.0–0.2)

## 2019-10-31 LAB — BASIC METABOLIC PANEL
Anion gap: 15 (ref 5–15)
BUN: 52 mg/dL — ABNORMAL HIGH (ref 8–23)
CO2: 27 mmol/L (ref 22–32)
Calcium: 9.4 mg/dL (ref 8.9–10.3)
Chloride: 92 mmol/L — ABNORMAL LOW (ref 98–111)
Creatinine, Ser: 2.04 mg/dL — ABNORMAL HIGH (ref 0.44–1.00)
GFR calc Af Amer: 27 mL/min — ABNORMAL LOW (ref >60)
GFR calc non Af Amer: 23 mL/min — ABNORMAL LOW (ref >60)
Glucose, Bld: 135 mg/dL — ABNORMAL HIGH (ref 70–99)
Potassium: 5.4 mmol/L — ABNORMAL HIGH (ref 3.5–5.1)
Sodium: 134 mmol/L — ABNORMAL LOW (ref 135–145)

## 2019-10-31 LAB — BLOOD GAS, ARTERIAL
Acid-Base Excess: 2.2 mmol/L — ABNORMAL HIGH (ref 0.0–2.0)
Bicarbonate: 26.5 mmol/L (ref 20.0–28.0)
Drawn by: 29503
FIO2: 100
O2 Saturation: 94.4 %
Patient temperature: 98.6
pCO2 arterial: 42 mmHg (ref 32.0–48.0)
pH, Arterial: 7.416 (ref 7.350–7.450)
pO2, Arterial: 70 mmHg — ABNORMAL LOW (ref 83.0–108.0)

## 2019-10-31 LAB — SARS CORONAVIRUS 2 BY RT PCR (HOSPITAL ORDER, PERFORMED IN ~~LOC~~ HOSPITAL LAB): SARS Coronavirus 2: NEGATIVE

## 2019-10-31 LAB — LACTIC ACID, PLASMA: Lactic Acid, Venous: 1 mmol/L (ref 0.5–1.9)

## 2019-10-31 LAB — BRAIN NATRIURETIC PEPTIDE: B Natriuretic Peptide: 23.4 pg/mL (ref 0.0–100.0)

## 2019-10-31 LAB — TROPONIN I (HIGH SENSITIVITY)
Troponin I (High Sensitivity): 9 ng/L (ref <18)
Troponin I (High Sensitivity): 22 ng/L — ABNORMAL HIGH (ref <18)

## 2019-10-31 MED ORDER — IPRATROPIUM-ALBUTEROL 0.5-2.5 (3) MG/3ML IN SOLN: 3.0000 mL | RESPIRATORY_TRACT | Status: DC | PRN

## 2019-10-31 MED ORDER — VANCOMYCIN HCL 1500 MG/300ML IV SOLN
1500.0000 mg | Freq: Once | INTRAVENOUS | Status: AC
  Administered 2019-10-31: 1500 mg via INTRAVENOUS
  Filled 2019-10-31: qty 300

## 2019-10-31 MED ORDER — ONDANSETRON HCL 4 MG/2ML IJ SOLN: 4.0000 mg | Freq: Four times a day (QID) | INTRAMUSCULAR | Status: DC | PRN

## 2019-10-31 MED ORDER — DOXYCYCLINE HYCLATE 100 MG PO TABS
100.0000 mg | ORAL_TABLET | Freq: Two times a day (BID) | ORAL | Status: DC
  Administered 2019-11-01 – 2019-11-03 (×5): 100 mg via ORAL
  Filled 2019-10-31 (×5): qty 1

## 2019-10-31 MED ORDER — DM-GUAIFENESIN ER 30-600 MG PO TB12
1.0000 | ORAL_TABLET | Freq: Two times a day (BID) | ORAL | Status: DC
  Filled 2019-10-31: qty 1

## 2019-10-31 MED ORDER — ONDANSETRON HCL 4 MG PO TABS
4.0000 mg | ORAL_TABLET | Freq: Four times a day (QID) | ORAL | Status: DC | PRN
  Administered 2019-11-02 (×2): 4 mg via ORAL
  Filled 2019-10-31 (×2): qty 1

## 2019-10-31 MED ORDER — SODIUM CHLORIDE 0.9 % IV SOLN
500.0000 mg | Freq: Once | INTRAVENOUS | Status: AC
  Administered 2019-10-31: 500 mg via INTRAVENOUS
  Filled 2019-10-31: qty 500

## 2019-10-31 MED ORDER — MAGNESIUM CITRATE PO SOLN: 1.0000 | Freq: Once | ORAL | Status: DC | PRN

## 2019-10-31 MED ORDER — POLYETHYLENE GLYCOL 3350 17 G PO PACK
17.0000 g | PACK | Freq: Every day | ORAL | Status: DC | PRN
  Administered 2019-11-05: 17 g via ORAL
  Filled 2019-10-31: qty 1

## 2019-10-31 MED ORDER — GUAIFENESIN-DM 100-10 MG/5ML PO SYRP
5.0000 mL | ORAL_SOLUTION | ORAL | Status: DC | PRN
  Administered 2019-10-31 – 2019-11-06 (×9): 5 mL via ORAL
  Filled 2019-10-31 (×9): qty 10

## 2019-10-31 MED ORDER — IPRATROPIUM-ALBUTEROL 0.5-2.5 (3) MG/3ML IN SOLN
3.0000 mL | Freq: Four times a day (QID) | RESPIRATORY_TRACT | Status: DC | PRN
  Administered 2019-10-31: 3 mL via RESPIRATORY_TRACT
  Filled 2019-10-31: qty 3

## 2019-10-31 MED ORDER — HEPARIN SODIUM (PORCINE) 5000 UNIT/ML IJ SOLN
5000.0000 [IU] | Freq: Three times a day (TID) | INTRAMUSCULAR | Status: DC
  Filled 2019-10-31: qty 1

## 2019-10-31 MED ORDER — SODIUM CHLORIDE 0.9 % IV SOLN
2.0000 g | INTRAVENOUS | Status: AC
  Administered 2019-10-31: 2 g via INTRAVENOUS
  Filled 2019-10-31: qty 2

## 2019-10-31 MED ORDER — SODIUM CHLORIDE 0.9 % IV SOLN: 1.0000 g | Freq: Once | INTRAVENOUS | Status: DC

## 2019-10-31 MED ORDER — SODIUM CHLORIDE 0.9 % IV SOLN: 2.0000 g | INTRAVENOUS | Status: DC

## 2019-10-31 MED ORDER — ACETAMINOPHEN 650 MG RE SUPP: 650.0000 mg | Freq: Four times a day (QID) | RECTAL | Status: DC | PRN

## 2019-10-31 MED ORDER — BISACODYL 5 MG PO TBEC: 5.0000 mg | DELAYED_RELEASE_TABLET | Freq: Every day | ORAL | Status: DC | PRN

## 2019-10-31 MED ORDER — RIVAROXABAN 20 MG PO TABS
20.0000 mg | ORAL_TABLET | Freq: Every day | ORAL | Status: DC
  Administered 2019-10-31 – 2019-11-03 (×4): 20 mg via ORAL
  Filled 2019-10-31 (×4): qty 1

## 2019-10-31 MED ORDER — VANCOMYCIN HCL 750 MG/150ML IV SOLN
750.0000 mg | INTRAVENOUS | Status: DC
  Filled 2019-10-31: qty 150

## 2019-10-31 MED ORDER — LACTATED RINGERS IV BOLUS (SEPSIS)
250.0000 mL | Freq: Once | INTRAVENOUS | Status: AC
  Administered 2019-10-31: 250 mL via INTRAVENOUS

## 2019-10-31 MED ORDER — ACETAMINOPHEN 325 MG PO TABS
650.0000 mg | ORAL_TABLET | Freq: Four times a day (QID) | ORAL | Status: DC | PRN
  Administered 2019-11-01 – 2019-11-05 (×2): 650 mg via ORAL
  Filled 2019-10-31 (×2): qty 2

## 2019-10-31 MED ORDER — LACTATED RINGERS IV BOLUS (SEPSIS)
1000.0000 mL | Freq: Once | INTRAVENOUS | Status: AC
  Administered 2019-10-31: 1000 mL via INTRAVENOUS

## 2019-10-31 MED ORDER — VANCOMYCIN HCL IN DEXTROSE 1-5 GM/200ML-% IV SOLN: 1000.0000 mg | Freq: Once | INTRAVENOUS | Status: DC

## 2019-10-31 MED ORDER — IPRATROPIUM-ALBUTEROL 0.5-2.5 (3) MG/3ML IN SOLN
3.0000 mL | Freq: Three times a day (TID) | RESPIRATORY_TRACT | Status: DC
  Administered 2019-10-31 – 2019-11-07 (×20): 3 mL via RESPIRATORY_TRACT
  Filled 2019-10-31 (×20): qty 3

## 2019-10-31 MED ORDER — SODIUM CHLORIDE 0.9 % IV SOLN
2.0000 g | INTRAVENOUS | Status: DC
  Administered 2019-10-31: 2 g via INTRAVENOUS
  Filled 2019-10-31: qty 20

## 2019-10-31 MED ORDER — TRAZODONE HCL 50 MG PO TABS: 25.0000 mg | ORAL_TABLET | Freq: Every evening | ORAL | Status: DC | PRN

## 2019-10-31 NOTE — ED Triage Notes (Signed)
Pt brought in by EMS, from home. C/o Cough, increased SOB. Currently being treated for pna. Was seen 1 month ago for same, never fully resolved, sx worsening since yesterday.   Hx lung CA.  80% RA  113/70 BP 120's HR 22RR 92% on 15L NRB 189 CBG

## 2019-10-31 NOTE — ED Provider Notes (Signed)
[facility] DEPT Provider Note   CSN: [id number] Arrival date & time: 10/31/19  8295     History Chief Complaint  Patient presents with   Shortness of Breath    Erin Myers is a 79 y.o. female.  Pt presents to the ED today with sob and cough.  Pt has a hx of lung cancer and is on 8L of oxygen normally.  Pt's O2 sats have been in the 80s at home.  Pt denies any f/c.  She's been Covid vaccinated.  She was treated for pneumonia recently (finished abx about 2 weeks ago) and has not felt well since then.  EMS put pt on a 100% NRB and she is feeling better and O2 sats are in the low 90s.        Past Medical History:  Diagnosis Date   Anxiety    Diabetes mellitus without complication ([place])    Dyslipidemia    HTN (hypertension)    Hyperlipemia    Hypothyroid    Hypothyroidism    Lung cancer ([place]) 2000   Carcinoid, s/p RLL lobectomy   Obesity    Ovarian cancer ([place])    Palpitations 2009 and 2006   Negative nuclear stress test    Sleep apnea    on C-pap    Patient Active Problem List   Diagnosis Date Noted   Severe sepsis with acute organ dysfunction ([place]) 10/31/2019   Palliative care by specialist    AKI (acute kidney injury) ([place]) 10/02/2019   Lobar pneumonia ([place]) 10/02/2019   Aortic atherosclerosis ([place]) 10/02/2019   Pneumonitis, interstitial ([place]) 09/19/2019   Pneumonitis 07/09/2019   Chronic hypoxemic respiratory failure ([place]) 05/30/2019   Recurrent right pleural effusion 06/28/2018   Antineoplastic chemotherapy induced anemia 05/16/2018   Bronchiectasis ([place]) 03/12/2018   Cough 11/10/2017   Bilateral pulmonary embolism ([place]) 10/19/2017   Port-A-Cath in place 08/18/2017   Encounter for antineoplastic immunotherapy 08/10/2017   Encounter for antineoplastic chemotherapy 08/10/2017   Adenocarcinoma of right lung, 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   Goals of care, counseling/discussion 07/31/2017    Sinusitis 07/19/2017   Acute on chronic respiratory failure with hypoxia ([place]) 07/19/2017   Hypokalemia 07/19/2017   Sepsis ([place]) 07/18/2017   Abnormal CT of the chest 07/14/2017   Hypoxemia 07/03/2017   Community acquired pneumonia of right upper lobe of lung 05/30/2017   Dyspnea 05/02/2017   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   History of posttraumatic stress disorder (PTSD) 05/14/2015   Obesity (BMI 30-39.9) 08/20/2012   Family history of coronary artery disease 08/17/2012   Palpitations    OSA on CPAP    Essential hypertension    Dyslipidemia, goal LDL below 130    Hypothyroid     Past Surgical History:  Procedure Laterality Date   ABDOMINAL HYSTERECTOMY  2001   BRONCHIAL BIOPSY  10/04/2019   Procedure: BRONCHIAL BIOPSIES;  Surgeon: Marshell Garfinkel, MD;  Location: [facility];  Service: Cardiopulmonary;;   BRONCHIAL BRUSHINGS  10/04/2019   Procedure: BRONCHIAL BRUSHINGS;  Surgeon: Marshell Garfinkel, MD;  Location: [facility];  Service: Cardiopulmonary;;   GRADED EXERCISE TOLERANCE TEST  08/2015    Blood pressure demonstrated a hypertensive response to exercise.  There was less than 0.22mm of horizontal ST segment depression in the inferolateral leads. No diagnostic criterial for ischemia. Moderately impaired exercise tolerance. The patient achieved 5.8 mets. This is a low risk study.   IR FLUORO GUIDE PORT INSERTION RIGHT  08/17/2017   IR THORACENTESIS ASP  PLEURAL SPACE W/IMG GUIDE  07/04/2018   IR US GUIDE VASC ACCESS RIGHT  08/17/2017   LOBECTOMY  2000   RLL   NM MYOCAR PERF WALL MOTION  08/20/2007   EF 74%  EXERCISE CAPACITY 7 METS   OOPHORECTOMY  2001   TONSILLECTOMY     VIDEO BRONCHOSCOPY Bilateral 07/20/2017   Procedure: VIDEO BRONCHOSCOPY WITH FLUORO;  Surgeon: Collene Gobble, MD;  Location: [facility];  Service: Cardiopulmonary;  Laterality: Bilateral;   VIDEO BRONCHOSCOPY N/A 10/04/2019   Procedure: VIDEO BRONCHOSCOPY WITH FLUORO;   Surgeon: Marshell Garfinkel, MD;  Location: [facility];  Service: Cardiopulmonary;  Laterality: N/A;     OB History   No obstetric history on file.     Family History  Problem Relation Age of Onset   Coronary artery disease Mother 66       died of an MI   Heart attack Mother    Suicidality Sister    CVA Brother 17   CVA Father     Social History   Tobacco Use   Smoking status: Former Smoker    Packs/day: 0.75    Years: 30.00    Pack years: 22.50    Types: Cigarettes    Quit date: 08/18/1991    Years since quitting: 28.2   Smokeless tobacco: Never Used  Substance Use Topics   Alcohol use: No    Alcohol/week: 0.0 standard drinks   Drug use: No    Home Medications Prior to Admission medications   Medication Sig Start Date End Date Taking? Authorizing Provider  amLODipine (NORVASC) 5 MG tablet Take 1 tablet (5 mg total) by mouth daily. 10/13/19   Hosie Poisson, MD  BREZTRI AEROSPHERE 160-9-4.8 MCG/ACT AERO Take 2 puffs by mouth 2 (two) times daily. 07/17/19   [provider]  clorazepate (TRANXENE) 7.5 MG tablet Take 3.75 mg by mouth in the morning and at bedtime.     [provider]  dexamethasone (DECADRON) 4 MG tablet Please take 1 tablet twice a day the day before, the day of, and the day after chemo therapy. 10/17/19   Heilingoetter, Cassandra L, PA-C  docusate sodium (COLACE) 100 MG capsule Take 1 capsule (100 mg total) by mouth 2 (two) times daily. 10/12/19   Hosie Poisson, MD  folic acid (FOLVITE) 1 MG tablet Take 1 tablet (1 mg total) by mouth daily. 10/17/19   Heilingoetter, Cassandra L, PA-C  guaiFENesin-dextromethorphan (ROBITUSSIN DM) 100-10 MG/5ML syrup Take 5 mLs by mouth every 4 (four) hours as needed for cough. 10/12/19   Hosie Poisson, MD  ipratropium-albuterol (DUONEB) 0.5-2.5 (3) MG/3ML SOLN INHALE THE CONTENTS OF 1 VIAL VIA NEBULIZER 3 TIMES A DAY. Patient taking differently: Inhale 3 mLs into the lungs 3 (three) times daily as needed.   07/10/19   Icard, Octavio Graves, DO  levothyroxine (SYNTHROID) 112 MCG tablet Take 112 mcg by mouth daily. 04/17/19   [provider]  metoprolol succinate (TOPROL-XL) 50 MG 24 hr tablet Take 50 mg by mouth 2 (two) times daily.     [provider]  Multiple Vitamins-Minerals (VISION FORMULA 2 PO) Take 1 tablet by mouth daily. OTC "Vision pills for Macular degeneration"     [provider]  predniSONE (DELTASONE) 20 MG tablet Prednisone 30 mg daily for 3 days followed by  Prednisone 20 mg daily for 3 days followed by  Prednisone 10 mg daily for 3 days. 10/12/19   Hosie Poisson, MD  prochlorperazine (COMPAZINE) 10 MG tablet Take 1  tablet (10 mg total) by mouth every 6 (six) hours as needed. 10/17/19   Heilingoetter, Cassandra L, PA-C  rosuvastatin (CRESTOR) 10 MG tablet Take 10 mg by mouth every evening.     [provider]  valACYclovir (VALTREX) 500 MG tablet Take 500 mg by mouth daily.    [provider]  XARELTO 20 MG TABS tablet TAKE 1 TABLET BY MOUTH DAILY WITH SUPPER. Patient taking differently: Take 20 mg by mouth daily with supper.  09/03/19   Curt Bears, MD    Allergies    Pembrolizumab, Adhesive [tape], Codeine, Lactose intolerance (gi), Monosodium glutamate, and Sulfa antibiotics  Review of Systems   Review of Systems  Respiratory: Positive for cough and shortness of breath.   All other systems reviewed and are negative.   Physical Exam Updated Vital Signs BP (!) 117/43    Pulse 91    Temp 99.1 F (37.3 C) (Oral)    Resp (!) 23    Ht 5\' 5"  (1.651 m)    Wt 72.5 kg    SpO2 100%    BMI 26.60 kg/m   Physical Exam Vitals and nursing note reviewed.  Constitutional:      Appearance: She is well-developed. She is ill-appearing.  HENT:     Head: Normocephalic and atraumatic.     Mouth/Throat:     Mouth: Mucous membranes are moist.     Pharynx: Oropharynx is clear.  Eyes:     Extraocular Movements: Extraocular movements intact.      Pupils: Pupils are equal, round, and reactive to light.  Cardiovascular:     Rate and Rhythm: Normal rate and regular rhythm.  Pulmonary:     Effort: Tachypnea present.  Abdominal:     General: Bowel sounds are normal.     Palpations: Abdomen is soft.  Musculoskeletal:        General: Normal range of motion.     Cervical back: Normal range of motion and neck supple.  Skin:    General: Skin is warm.     Capillary Refill: Capillary refill takes less than 2 seconds.  Neurological:     General: No focal deficit present.     Mental Status: She is alert and oriented to person, place, and time.  Psychiatric:        Mood and Affect: Mood normal.        Behavior: Behavior normal.     ED Results / Procedures / Treatments   Labs (all labs ordered are listed, but only abnormal results are displayed) Labs Reviewed  BASIC METABOLIC PANEL - Abnormal; Notable for the following components:      Result Value   Sodium 134 (*)    Potassium 5.4 (*)    Chloride 92 (*)    Glucose, Bld 135 (*)    BUN 52 (*)    Creatinine, Ser 2.04 (*)    GFR calc non Af Amer 23 (*)    GFR calc Af Amer 27 (*)    All other components within normal limits  CBC WITH DIFFERENTIAL/PLATELET - Abnormal; Notable for the following components:   WBC 1.9 (*)    Platelets 140 (*)    Neutro Abs 1.4 (*)    Lymphs Abs 0.3 (*)    Abs Immature Granulocytes 0.14 (*)    All other components within normal limits  BLOOD GAS, ARTERIAL - Abnormal; Notable for the following components:   pO2, Arterial 70.0 (*)    Acid-Base Excess 2.2 (*)  All other components within normal limits  TROPONIN I (HIGH SENSITIVITY) - Abnormal; Notable for the following components:   Troponin I (High Sensitivity) 22 (*)    All other components within normal limits  CULTURE, BLOOD (ROUTINE X 2)  SARS CORONAVIRUS 2 BY RT PCR (HOSPITAL ORDER, [facility])  CULTURE, BLOOD (ROUTINE X 2)  BRAIN NATRIURETIC PEPTIDE  LACTIC  ACID, PLASMA  LACTIC ACID, PLASMA  PROTIME-INR  URINALYSIS, ROUTINE W REFLEX MICROSCOPIC  APTT  TROPONIN I (HIGH SENSITIVITY)    EKG EKG Interpretation  Date/Time:  Thursday October 31 2019 09:55:22 EDT Ventricular Rate:  115 PR Interval:    QRS Duration: 84 QT Interval:  295 QTC Calculation: 408 R Axis:   31 Text Interpretation: Sinus tachycardia Biatrial enlargement No significant change since last tracing Confirmed by Isla Pence [phone]) on 10/31/2019 9:59:01 AM   Radiology DG Chest Port 1 View  Result Date: 10/31/2019 CLINICAL DATA:  Shortness of breath cough EXAM: PORTABLE CHEST 1 VIEW COMPARISON:  10/06/2019 FINDINGS: RIGHT-sided Port-A-Cath remains in place terminating in the upper RIGHT atrium. Trachea midline. Cardiomediastinal contours and hilar structures are unremarkable. Persistent interstitial and airspace disease in the RIGHT chest predominantly throughout the RIGHT chest also in the LEFT lung base. On limited assessment visualized skeletal structures without acute process. IMPRESSION: Persistent bilateral airspace and interstitial changes without significant change since prior imaging suspicious for pneumonia. Asymmetric edema could have a similar appearance though prior cross-sectional imaging appeared more compatible with pneumonia. Possible small effusions. Electronically Signed   By: Zetta Bills M.D.   On: 10/31/2019 10:16    Procedures Procedures (including critical care time)  Medications Ordered in ED Medications  lactated ringers bolus 1,000 mL (0 mLs Intravenous Stopped 10/31/19 1312)    And  lactated ringers bolus 1,000 mL (1,000 mLs Intravenous New Bag/Given 10/31/19 1328)    And  lactated ringers bolus 250 mL (has no administration in time range)  cefTRIAXone (ROCEPHIN) 2 g in sodium chloride 0.9 % 100 mL IVPB (0 g Intravenous Stopped 10/31/19 1146)  azithromycin (ZITHROMAX) 500 mg in sodium chloride 0.9 % 250 mL IVPB (0 mg Intravenous Stopped  10/31/19 1312)    ED Course  I have reviewed the triage vital signs and the nursing notes.  Pertinent labs & imaging results that were available during my care of the patient were reviewed by me and considered in my medical decision making (see chart for details).    MDM Rules/Calculators/A&P                          Pt does have PNA on CXR.  She is treated with rocephin and zithromax.  Blood cultures sent.  Code sepsis called due to pna, fever, increased rr and hr.  Pt's HR and RR have improved.  BP stable.  Covid neg.    Pt d/w Dr. Cyndia Skeeters (triad) for admission.  CRITICAL CARE Performed by: Isla Pence   Total critical care time: 30 minutes  Critical care time was exclusive of separately billable procedures and treating other patients.  Critical care was necessary to treat or prevent imminent or life-threatening deterioration.  Critical care was time spent personally by me on the following activities: development of treatment plan with patient and/or surrogate as well as nursing, discussions with consultants, evaluation of patient's response to treatment, examination of patient, obtaining history from patient or surrogate, ordering and performing treatments and interventions, ordering and review of  laboratory studies, ordering and review of radiographic studies, pulse oximetry and re-evaluation of patient's condition.  Erin Myers was evaluated in Emergency Department on 10/31/2019 for the symptoms described in the history of present illness. She was evaluated in the context of the global COVID-19 pandemic, which necessitated consideration that the patient might be at risk for infection with the SARS-CoV-2 virus that causes COVID-19. Institutional protocols and algorithms that pertain to the evaluation of patients at risk for COVID-19 are in a state of rapid change based on information released by regulatory bodies including the CDC and federal and state organizations. These  policies and algorithms were followed during the patient's care in the ED.   Final Clinical Impression(s) / ED Diagnoses Final diagnoses:  Acute respiratory failure with hypoxia ([place])  Community acquired pneumonia, unspecified laterality  AKI (acute kidney injury) ([place])    Rx / DC Orders ED Discharge Orders    None       Isla Pence, MD 10/31/19 1331

## 2019-10-31 NOTE — H&P (Addendum)
History and Physical    Erin Myers SAY:[id number] DOB: [birth year] DOA: 10/31/2019  PCP: [name], MD Patient coming from: Home  Chief Complaint: Cough and shortness of breath  HPI: Erin Myers is a 79 y.o. 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  Patient was hospitalized 7/19-7/31 for acute on chronic respiratory failure in the setting of pneumonia for which she was treated with IV antibiotics. She resumed her chemotherapy on 8/12. She was well and in her usual state of house until last night when she started coughing a lot. Cough was productive with whitish phlegm. She felt short of breath this morning. Oxygen dropped to 85% on 8 L which prompted family to call EMS.   Patient denies fever, chills, chest pain, nausea, vomiting, abdominal pain, diarrhea, dysuria, frequency or focal neuro symptoms other than chronic neuropathy.  She lives with her husband. Denies smoking cigarettes, drinking alcohol recreational drug use. Wishes to remain full code.  In ED, HR 117>> 91. RR 26>> 23. BP 130/67>>> 117/43. WBC 1.9. Lactic acid 1.0. Na 134. K5.4. Cr 2.04 (1.25 on 8/12). BNP within normal. HS trop 9> 22. COVID-19 PCR negative. ABG without significant finding. Chest x-ray with worsening bilateral infiltrate concerning for pneumonia. Sepsis code activated. Cultures obtained. Received 2.25 L of IV fluid resuscitation. Started on ceftriaxone and azithromycin. Hospitalist service called for admission.  ROS All review of system negative except for pertinent positives and negatives as history of present illness above.  PMH Past Medical History:  Diagnosis Date  . Anxiety   . Diabetes mellitus without complication ([place])   . Dyslipidemia   . HTN (hypertension)   . Hyperlipemia   .  Hypothyroid   . Hypothyroidism   . Lung cancer ([place]) 2000   Carcinoid, s/p RLL lobectomy  . Obesity   . Ovarian cancer ([place])   . Palpitations 2009 and 2006   Negative nuclear stress test   . Sleep apnea    on C-pap   PSH Past Surgical History:  Procedure Laterality Date  . ABDOMINAL HYSTERECTOMY  2001  . BRONCHIAL BIOPSY  10/04/2019   Procedure: BRONCHIAL BIOPSIES;  Surgeon: Marshell Garfinkel, MD;  Location: [facility];  Service: Cardiopulmonary;;  . BRONCHIAL BRUSHINGS  10/04/2019   Procedure: BRONCHIAL BRUSHINGS;  Surgeon: Marshell Garfinkel, MD;  Location: [facility];  Service: Cardiopulmonary;;  . GRADED EXERCISE TOLERANCE TEST  08/2015    Blood pressure demonstrated a hypertensive response to exercise.  There was less than 0.58mm of horizontal ST segment depression in the inferolateral leads. No diagnostic criterial for ischemia. Moderately impaired exercise tolerance. The patient achieved 5.8 mets. This is a low risk study.  . IR FLUORO GUIDE PORT INSERTION RIGHT  08/17/2017  . IR THORACENTESIS ASP PLEURAL SPACE W/IMG GUIDE  07/04/2018  . IR US GUIDE VASC ACCESS RIGHT  08/17/2017  . LOBECTOMY  2000   RLL  . NM MYOCAR PERF WALL MOTION  08/20/2007   EF 74%  EXERCISE CAPACITY 7 METS  . OOPHORECTOMY  2001  . TONSILLECTOMY    . VIDEO BRONCHOSCOPY Bilateral 07/20/2017   Procedure: VIDEO BRONCHOSCOPY WITH FLUORO;  Surgeon: Collene Gobble, MD;  Location: [facility];  Service: Cardiopulmonary;  Laterality: Bilateral;  . VIDEO BRONCHOSCOPY N/A 10/04/2019   Procedure: VIDEO BRONCHOSCOPY WITH FLUORO;  Surgeon: Marshell Garfinkel, MD;  Location: [facility]  ENDOSCOPY;  Service: Cardiopulmonary;  Laterality: N/A;   Fam HX Family History  Problem Relation Age of Onset  . Coronary artery disease Mother 51       died of an MI  . Heart attack Mother   . Suicidality Sister   . CVA Brother 74  . CVA Father     Social Hx  reports that she quit smoking about 28 years ago. Her smoking use included  cigarettes. She has a 22.50 pack-year smoking history. She has never used smokeless tobacco. She reports that she does not drink alcohol and does not use drugs.  Allergy Allergies  Allergen Reactions  . Pembrolizumab Anaphylaxis  . Adhesive [Tape] Other (See Comments)    Irritates skin   . Codeine Other (See Comments)    Skin crawlinf feeling  . Lactose Intolerance (Gi) Diarrhea  . Monosodium Glutamate Diarrhea  . Sulfa Antibiotics Nausea And Vomiting   Home Meds Prior to Admission medications   Medication Sig Start Date End Date Taking? Authorizing Provider  amLODipine (NORVASC) 5 MG tablet Take 1 tablet (5 mg total) by mouth daily. 10/13/19   Hosie Poisson, MD  BREZTRI AEROSPHERE 160-9-4.8 MCG/ACT AERO Take 2 puffs by mouth 2 (two) times daily. 07/17/19   [provider]  clorazepate (TRANXENE) 7.5 MG tablet Take 3.75 mg by mouth in the morning and at bedtime.     [provider]  dexamethasone (DECADRON) 4 MG tablet Please take 1 tablet twice a day the day before, the day of, and the day after chemo therapy. 10/17/19   Heilingoetter, Cassandra L, PA-C  docusate sodium (COLACE) 100 MG capsule Take 1 capsule (100 mg total) by mouth 2 (two) times daily. 10/12/19   Hosie Poisson, MD  folic acid (FOLVITE) 1 MG tablet Take 1 tablet (1 mg total) by mouth daily. 10/17/19   Heilingoetter, Cassandra L, PA-C  guaiFENesin-dextromethorphan (ROBITUSSIN DM) 100-10 MG/5ML syrup Take 5 mLs by mouth every 4 (four) hours as needed for cough. 10/12/19   Hosie Poisson, MD  ipratropium-albuterol (DUONEB) 0.5-2.5 (3) MG/3ML SOLN INHALE THE CONTENTS OF 1 VIAL VIA NEBULIZER 3 TIMES A DAY. Patient taking differently: Inhale 3 mLs into the lungs 3 (three) times daily as needed.  07/10/19   Icard, Octavio Graves, DO  levothyroxine (SYNTHROID) 112 MCG tablet Take 112 mcg by mouth daily. 04/17/19   [provider]  metoprolol succinate (TOPROL-XL) 50 MG 24 hr tablet Take 50 mg by mouth 2 (two) times daily.      [provider]  Multiple Vitamins-Minerals (VISION FORMULA 2 PO) Take 1 tablet by mouth daily. OTC "Vision pills for Macular degeneration"     [provider]  predniSONE (DELTASONE) 20 MG tablet Prednisone 30 mg daily for 3 days followed by  Prednisone 20 mg daily for 3 days followed by  Prednisone 10 mg daily for 3 days. 10/12/19   Hosie Poisson, MD  prochlorperazine (COMPAZINE) 10 MG tablet Take 1 tablet (10 mg total) by mouth every 6 (six) hours as needed. 10/17/19   Heilingoetter, Cassandra L, PA-C  rosuvastatin (CRESTOR) 10 MG tablet Take 10 mg by mouth every evening.     [provider]  valACYclovir (VALTREX) 500 MG tablet Take 500 mg by mouth daily.    [provider]  XARELTO 20 MG TABS tablet TAKE 1 TABLET BY MOUTH DAILY WITH SUPPER. Patient taking differently: Take 20 mg by mouth daily with supper.  09/03/19   Curt Bears, MD    Physical  Exam: Vitals:   10/31/19 0941 10/31/19 0950 10/31/19 1130 10/31/19 1300  BP:  130/67 (!) 119/44 (!) 117/43  Pulse:  (!) 117 (!) 101 91  Resp:  (!) 26 (!) 23 (!) 23  Temp:  99.1 F (37.3 C)    TempSrc:  Oral    SpO2:  91% 94% 100%  Weight: 72.5 kg     Height: 5\' 5"  (1.651 m)       GENERAL: No acute distress.  Appears well.  HEENT: MMM.  Vision and hearing grossly intact.  NECK: Supple.  No apparent JVD.  RESP: 97% on nonrebreather. No IWOB. Fair aeration. Bilateral crackles. CVS:  RRR. Heart sounds normal.  ABD/GI/GU: Bowel sounds present. Soft. Non tender.  MSK/EXT:  Moves extremities. No apparent deformity or edema.  SKIN: no apparent skin lesion or wound NEURO: Awake, alert and oriented appropriately.  No gross deficit.  PSYCH: Calm. Normal affect.   Personally Reviewed Radiological Exams DG Chest Port 1 View  Result Date: 10/31/2019 CLINICAL DATA:  Shortness of breath cough EXAM: PORTABLE CHEST 1 VIEW COMPARISON:  10/06/2019 FINDINGS: RIGHT-sided Port-A-Cath remains in place terminating  in the upper RIGHT atrium. Trachea midline. Cardiomediastinal contours and hilar structures are unremarkable. Persistent interstitial and airspace disease in the RIGHT chest predominantly throughout the RIGHT chest also in the LEFT lung base. On limited assessment visualized skeletal structures without acute process. IMPRESSION: Persistent bilateral airspace and interstitial changes without significant change since prior imaging suspicious for pneumonia. Asymmetric edema could have a similar appearance though prior cross-sectional imaging appeared more compatible with pneumonia. Possible small effusions. Electronically Signed   By: Zetta Bills M.D.   On: 10/31/2019 10:16     Personally Reviewed Labs: CBC: Recent Labs  Lab 10/31/19 0941  WBC 1.9*  NEUTROABS 1.4*  HGB 13.0  HCT 40.7  MCV 99.0  PLT 161*   Basic Metabolic Panel: Recent Labs  Lab 10/31/19 0941  NA 134*  K 5.4*  CL 92*  CO2 27  GLUCOSE 135*  BUN 52*  CREATININE 2.04*  CALCIUM 9.4   GFR: Estimated Creatinine Clearance: 23.4 mL/min (A) (by C-G formula based on SCr of 2.04 mg/dL (H)). Liver Function Tests: No results for input(s): AST, ALT, ALKPHOS, BILITOT, PROT, ALBUMIN in the last 168 hours. No results for input(s): LIPASE, AMYLASE in the last 168 hours. No results for input(s): AMMONIA in the last 168 hours. Coagulation Profile: No results for input(s): INR, PROTIME in the last 168 hours. Cardiac Enzymes: No results for input(s): CKTOTAL, CKMB, CKMBINDEX, TROPONINI in the last 168 hours. BNP (last 3 results) No results for input(s): PROBNP in the last 8760 hours. HbA1C: No results for input(s): HGBA1C in the last 72 hours. CBG: No results for input(s): GLUCAP in the last 168 hours. Lipid Profile: No results for input(s): CHOL, HDL, LDLCALC, TRIG, CHOLHDL, LDLDIRECT in the last 72 hours. Thyroid Function Tests: No results for input(s): TSH, T4TOTAL, FREET4, T3FREE, THYROIDAB in the last 72 hours. Anemia  Panel: No results for input(s): VITAMINB12, FOLATE, FERRITIN, TIBC, IRON, RETICCTPCT in the last 72 hours. Urine analysis:    Component Value Date/Time   COLORURINE STRAW (A) 10/02/2019 1030   APPEARANCEUR CLEAR 10/02/2019 1030   LABSPEC 1.013 10/02/2019 1030   PHURINE 6.0 10/02/2019 1030   GLUCOSEU NEGATIVE 10/02/2019 1030   HGBUR NEGATIVE 10/02/2019 1030   BILIRUBINUR NEGATIVE 10/02/2019 1030   KETONESUR NEGATIVE 10/02/2019 1030   PROTEINUR NEGATIVE 10/02/2019 1030   NITRITE NEGATIVE 10/02/2019 1030  LEUKOCYTESUR NEGATIVE 10/02/2019 1030    Sepsis Labs:  Lactic acid within normal.  Personally Reviewed EKG:  12-lead EKG with sinus tachycardia but no acute ischemic finding.  Assessment/Plan Severe sepsis with organ dysfunction due to pneumonia in immunocompromised patient-tachycardic, tachypneic and leukopenic. Source of infection is pneumonia. Has AKI and mildly elevated troponin. -Broad-spectrum antibiotic with cefepime, vancomycin and doxycycline -Follow blood cultures. Will obtain respiratory cultures -Supportive care with incentive spirometry, flutter valve, mucolytic's, antitussives and nebulizers -OOB/PT/OT  Acute on chronic respiratory failure-desaturated to 85% on baseline 8 L requiring NRB.  Likely due to pneumonia. -Treat pneumonia as above -As needed DuoNeb -Wean oxygen as able  Acute kidney injury: Cr 2.04 (1.25 on 8/12). Likely prerenal in the setting of sepsis. -IV fluid as above -Recheck in the morning  Hyperkalemia: Likely due to AKI -Recheck in the morning  Mild hyponatremia: Likely from dehydration. Could be SIADH as well. -Continue monitoring  Mild elevated troponin-likely demand ischemia for due to sepsis.  Denies chest pain.  EKG without acute ischemic finding.  Leukopenia: WBC 1.9 (29 on 8/12).  Likely due to chemotherapy. -Recheck in the morning  Stage IV non-small cell lung cancer-recently restarted on chemotherapy on 8/12. -Add oncology  to treatment team  Essential hypertension: Slightly hypotensive now. -Continue IV fluid -Hold home antihypertensive medication  Hyperglycemia: History of DM-2 on a chart but not on medication. -Check hemoglobin A1c  History of pulmonary embolism: On Xarelto at home. -Continue home Xarelto-pharmacy to dose in the setting of renal function  Hypothyroidism -Continue home Synthroid  OSA on CPAP -Nightly CPAP  Goal of care-patient with significant comorbidities as above.  She wishes to remain full code which I believe would pose more harm than benefit.  She was seen by palliative care previous hospitalization. -May consider involving palliative care  DVT prophylaxis: On Xarelto for PE. Code Status: Full code Family Communication: Updated patient's husband at bedside.  Disposition Plan: Admit to stepdown unit for severe sepsis Consults called: Oncology added to treatment team Admission status: Inpatient.  Immunocompromised patient with severe sepsis in the setting of pneumonia.    Mercy Riding MD [facility]  If 7PM-7AM, please contact night-coverage [email or web address]  10/31/2019, 2:03 PM

## 2019-10-31 NOTE — Sepsis Progress Note (Signed)
Notified bedside nurse of need to draw lactic acid and to administer antibiotics.

## 2019-10-31 NOTE — Progress Notes (Signed)
Pharmacy Antibiotic Note  Erin Myers is a 79 y.o. female admitted on 10/31/2019 with severe sepsis secondary to pneumonia. Pharmacy has been consulted for Vancomycin and Cefepime dosing. Patient also placed on Doxycycline per MD.   Plan: Vancomycin 1500mg  IV x 1, then 750mg  IV q24h Vancomycin trough level at steady state, as indicated Cefepime 2g IV q24h Monitor renal function, cultures, clinical course   Height: 5\' 5"  (165.1 cm) Weight: 72.5 kg (159 lb 13.3 oz) IBW/kg (Calculated) : 57  Temp (24hrs), Avg:99.1 F (37.3 C), Min:99.1 F (37.3 C), Max:99.1 F (37.3 C)  Recent Labs  Lab 10/31/19 0941 10/31/19 1117  WBC 1.9*  --   CREATININE 2.04*  --   LATICACIDVEN  --  1.0    Estimated Creatinine Clearance: 23.4 mL/min (A) (by C-G formula based on SCr of 2.04 mg/dL (H)).    Allergies  Allergen Reactions  . Pembrolizumab Anaphylaxis  . Adhesive [Tape] Other (See Comments)    Irritates skin   . Codeine Other (See Comments)    Skin crawlinf feeling  . Lactose Intolerance (Gi) Diarrhea  . Monosodium Glutamate Diarrhea  . Sulfa Antibiotics Nausea And Vomiting    Antimicrobials this admission: 8/19 Ceftriaxone x 1 8/19 Azithromycin x 1 8/19 Vancomycin >> 8/19 Cefepime >> 8/20 Doxycycline >> (8/24)  Dose adjustments this admission: --  Microbiology results: 8/19 BCx: sent 8/19 COVID: negative   Thank you for allowing pharmacy to be a part of this patient's care.   Lindell Spar, PharmD, BCPS Clinical Pharmacist  10/31/2019 2:25 PM

## 2019-10-31 NOTE — Telephone Encounter (Signed)
Received TC from pts husband stating that pt was having some breathing issues this morning so he called EMS and they transported patient to [facility] ED so she will not be coming to appointments today.  Cassie PA made aware.

## 2019-10-31 NOTE — Progress Notes (Signed)
ANTICOAGULATION CONSULT NOTE - Initial Consult  Pharmacy Consult for Xarelto  Indication: history of PE  Allergies  Allergen Reactions  . Pembrolizumab Anaphylaxis  . Adhesive [Tape] Other (See Comments)    Irritates skin   . Codeine Other (See Comments)    Skin crawlinf feeling  . Lactose Intolerance (Gi) Diarrhea  . Monosodium Glutamate Diarrhea  . Sulfa Antibiotics Nausea And Vomiting    Patient Measurements: Height: 5\' 5"  (165.1 cm) Weight: 72.5 kg (159 lb 13.3 oz) IBW/kg (Calculated) : 57   Vital Signs: Temp: 99.1 F (37.3 C) (08/19 0950) Temp Source: Oral (08/19 0950) BP: 115/41 (08/19 1500) Pulse Rate: 92 (08/19 1500)  Labs: Recent Labs    10/31/19 0941 10/31/19 1216  HGB 13.0  --   HCT 40.7  --   PLT 140*  --   CREATININE 2.04*  --   TROPONINIHS 9 22*    Estimated Creatinine Clearance: 23.4 mL/min (A) (by C-G formula based on SCr of 2.04 mg/dL (H)).   Medical History: Past Medical History:  Diagnosis Date  . Anxiety   . Diabetes mellitus without complication ([place])   . Dyslipidemia   . HTN (hypertension)   . Hyperlipemia   . Hypothyroid   . Hypothyroidism   . Lung cancer ([place]) 2000   Carcinoid, s/p RLL lobectomy  . Obesity   . Ovarian cancer ([place])   . Palpitations 2009 and 2006   Negative nuclear stress test   . Sleep apnea    on C-pap     Assessment: 52 y/oF with PMH of PE on Xarelto, stage IV non-small cell lung cancer admitted with severe sepsis 2/2 PNA as well as AKI. Pharmacy consulted to resume Xarelto. PTA dose 20mg  PO daily. SCr 2.04. CrCl remains > 15 ml/min. Hgb WNL at 13, Pltc slightly low at 140K. Per updated prescribing information, avoid Xarelto use with CrCl < 60ml/min, but otherwise no dose adjustment needed for this indication.   Plan:  Resume Xarelto 20mg  PO daily with supper  Follow renal function, CBC, and for s/sx of bleeding   Lindell Spar, PharmD, BCPS Clinical Pharmacist  10/31/2019,4:06 PM

## 2019-11-01 ENCOUNTER — Other Ambulatory Visit: Payer: Self-pay

## 2019-11-01 DIAGNOSIS — E119 Type 2 diabetes mellitus without complications: Secondary | ICD-10-CM

## 2019-11-01 DIAGNOSIS — C3490 Malignant neoplasm of unspecified part of unspecified bronchus or lung: Secondary | ICD-10-CM

## 2019-11-01 DIAGNOSIS — Z86711 Personal history of pulmonary embolism: Secondary | ICD-10-CM

## 2019-11-01 DIAGNOSIS — E875 Hyperkalemia: Secondary | ICD-10-CM

## 2019-11-01 DIAGNOSIS — Z7189 Other specified counseling: Secondary | ICD-10-CM

## 2019-11-01 DIAGNOSIS — Z66 Do not resuscitate: Secondary | ICD-10-CM

## 2019-11-01 DIAGNOSIS — J9601 Acute respiratory failure with hypoxia: Secondary | ICD-10-CM

## 2019-11-01 DIAGNOSIS — Z515 Encounter for palliative care: Secondary | ICD-10-CM

## 2019-11-01 DIAGNOSIS — E871 Hypo-osmolality and hyponatremia: Secondary | ICD-10-CM

## 2019-11-01 LAB — CREATININE, SERUM
Creatinine, Ser: 1.43 mg/dL — ABNORMAL HIGH (ref 0.44–1.00)
GFR calc Af Amer: 41 mL/min — ABNORMAL LOW (ref >60)
GFR calc non Af Amer: 35 mL/min — ABNORMAL LOW (ref >60)

## 2019-11-01 LAB — CBC
HCT: 34.1 % — ABNORMAL LOW (ref 36.0–46.0)
Hemoglobin: 10.9 g/dL — ABNORMAL LOW (ref 12.0–15.0)
MCH: 32.2 pg (ref 26.0–34.0)
MCHC: 32 g/dL (ref 30.0–36.0)
MCV: 100.9 fL — ABNORMAL HIGH (ref 80.0–100.0)
Platelets: 92 10*3/uL — ABNORMAL LOW (ref 150–400)
RBC: 3.38 MIL/uL — ABNORMAL LOW (ref 3.87–5.11)
RDW: 14 % (ref 11.5–15.5)
WBC: 2.5 10*3/uL — ABNORMAL LOW (ref 4.0–10.5)
nRBC: 0 % (ref 0.0–0.2)

## 2019-11-01 LAB — PROTIME-INR
INR: 1.8 — ABNORMAL HIGH (ref 0.8–1.2)
Prothrombin Time: 20.4 seconds — ABNORMAL HIGH (ref 11.4–15.2)

## 2019-11-01 LAB — LACTIC ACID, PLASMA: Lactic Acid, Venous: 0.7 mmol/L (ref 0.5–1.9)

## 2019-11-01 LAB — HEMOGLOBIN A1C
Hgb A1c MFr Bld: 5.9 % — ABNORMAL HIGH (ref 4.8–5.6)
Mean Plasma Glucose: 122.63 mg/dL

## 2019-11-01 LAB — MRSA PCR SCREENING: MRSA by PCR: NEGATIVE

## 2019-11-01 LAB — CORTISOL-AM, BLOOD: Cortisol - AM: 15.3 ug/dL (ref 6.7–22.6)

## 2019-11-01 LAB — GLUCOSE, CAPILLARY
Glucose-Capillary: 88 mg/dL (ref 70–99)
Glucose-Capillary: 85 mg/dL (ref 70–99)

## 2019-11-01 LAB — PROCALCITONIN: Procalcitonin: 1.75 ng/mL

## 2019-11-01 MED ORDER — SODIUM CHLORIDE 0.9 % IV BOLUS
500.0000 mL | Freq: Once | INTRAVENOUS | Status: AC
  Administered 2019-11-01: 500 mL via INTRAVENOUS

## 2019-11-01 MED ORDER — VALACYCLOVIR HCL 500 MG PO TABS
500.0000 mg | ORAL_TABLET | Freq: Every day | ORAL | Status: DC
  Administered 2019-11-01 – 2019-11-07 (×7): 500 mg via ORAL
  Filled 2019-11-01 (×7): qty 1

## 2019-11-01 MED ORDER — CHLORHEXIDINE GLUCONATE CLOTH 2 % EX PADS
6.0000 | MEDICATED_PAD | Freq: Every day | CUTANEOUS | Status: DC
  Administered 2019-11-01: 6 via TOPICAL

## 2019-11-01 MED ORDER — SODIUM CHLORIDE 0.9 % IV SOLN
2.0000 g | Freq: Two times a day (BID) | INTRAVENOUS | Status: DC
  Administered 2019-11-01 – 2019-11-03 (×4): 2 g via INTRAVENOUS
  Filled 2019-11-01 (×4): qty 2

## 2019-11-01 MED ORDER — SODIUM CHLORIDE 0.9% FLUSH
10.0000 mL | INTRAVENOUS | Status: DC | PRN
  Administered 2019-11-06: 10 mL

## 2019-11-01 MED ORDER — ORAL CARE MOUTH RINSE
15.0000 mL | Freq: Two times a day (BID) | OROMUCOSAL | Status: DC
  Administered 2019-11-01 – 2019-11-07 (×12): 15 mL via OROMUCOSAL

## 2019-11-01 MED ORDER — INSULIN ASPART 100 UNIT/ML ~~LOC~~ SOLN
0.0000 [IU] | Freq: Three times a day (TID) | SUBCUTANEOUS | Status: DC
  Administered 2019-11-04: 1 [IU] via SUBCUTANEOUS
  Administered 2019-11-04: 2 [IU] via SUBCUTANEOUS
  Administered 2019-11-05 – 2019-11-06 (×2): 1 [IU] via SUBCUTANEOUS

## 2019-11-01 MED ORDER — CLORAZEPATE DIPOTASSIUM 3.75 MG PO TABS
3.7500 mg | ORAL_TABLET | Freq: Two times a day (BID) | ORAL | Status: DC
  Administered 2019-11-01 – 2019-11-07 (×12): 3.75 mg via ORAL
  Filled 2019-11-01 (×12): qty 1

## 2019-11-01 MED ORDER — LEVOTHYROXINE SODIUM 112 MCG PO TABS
112.0000 ug | ORAL_TABLET | Freq: Every day | ORAL | Status: DC
  Administered 2019-11-01 – 2019-11-07 (×7): 112 ug via ORAL
  Filled 2019-11-01 (×8): qty 1

## 2019-11-01 MED ORDER — SODIUM CHLORIDE 0.9% FLUSH
10.0000 mL | Freq: Two times a day (BID) | INTRAVENOUS | Status: DC
  Administered 2019-11-01 – 2019-11-02 (×3): 10 mL

## 2019-11-01 NOTE — Assessment & Plan Note (Signed)
-  continue synthroid 

## 2019-11-01 NOTE — Assessment & Plan Note (Addendum)
-   patient has had 23 cycles of chemo at this point.  She has poor functional status with ongoing decline and has been on monotherapy Alimta.  She has not been a candidate for other treatments due to her functional status.  She is now dependent on 8 L nasal cannula oxygen at home and is now having multiple hospitalizations with worsening quality of life.  Bedside discussions held this morning with patient and her husband.  We discussed goals of care and she then elected for becoming DNR after full explanation which she was able to explain back.  She would not want to have cardiac resuscitation or mechanical ventilation.  We discussed that this would mean DNR status but still treating reversible conditions if they would make an impact on her quality of life; she was amenable to this. -She has also been seen by oncology and is considering going home with hospice.  Palliative care following; greatly appreciate assistance.  Ongoing goals of care discussions being held

## 2019-11-01 NOTE — Assessment & Plan Note (Signed)
-   currently hypotensive; hold meds - see sepsis

## 2019-11-01 NOTE — Progress Notes (Signed)
Occupational Therapy Evaluation  Patient with functional deficits listed below impacting safety and independence with self care. Patient reports from previous hospitalization end of July until this admission she was mod I with basic ADLs with intermittent use of walker and was receiving HH PT. Patient currently min A for bed mobility and functional transfer to recliner with walker due to weakness, decreased activity tolerance. Patient desat to 83% on 8L with transfer and did not recover above 85% after ~5 min seated rest therefore increased to 10L. Encouraged PLB techniques (patient reports hard to breath in through her nose) and with another ~5 min able to wean back to 8L maintaining 90-91%, notified RN of this. Recommend continued acute OT services to maximize patient activity tolerance necessary to complete self care tasks at home.    11/01/19 1100  OT Visit Information  Last OT Received On 11/01/19  Assistance Needed +1  History of Present Illness Patient is 79 y.o. female with PMH including lung cancer and chemotherapy, interstitial lung disease, COPD, chronic hypoxic respiratory failure on 8 L with recent hospitalization for PNA. Presented to ED 8/19 with SOB and O2 readings 85% on 8L at home. Patient admitted with Severe sepsis with organ dysfunction due to pneumonia  Precautions  Precautions Fall  Precaution Comments monitor vitals   Restrictions  Weight Bearing Restrictions No  Home Living  Family/patient expects to be discharged to: Private residence  Living Arrangements Spouse/significant other  Available Help at Discharge Family  Type of [place] to enter  Entrance Stairs-Number of Steps 3-4 at garage  [place] Two level;Able to live on main level with bedroom/bathroom;Full bath on main level  Nurse, learning disability - single point;Shower seat;Walker - 2 wheels  Prior  Function  Level of Independence Independent with assistive device(s)  Comments patient reports since july hospitalization patient was using walker as needed and was I with basic ADLs, spouse performs IADLs   Communication  Communication No difficulties  Pain Assessment  Pain Assessment No/denies pain  Cognition  Arousal/Alertness Awake/alert  Behavior During Therapy WFL for tasks assessed/performed  Overall Cognitive Status Within Functional Limits for tasks assessed  Upper Extremity Assessment  Upper Extremity Assessment Generalized weakness  Lower Extremity Assessment  Lower Extremity Assessment Defer to PT evaluation  ADL  Overall ADL's  Needs assistance/impaired  Grooming Wash/dry face;Wash/dry hands;Set up;Sitting  Upper Body Bathing Set up;Sitting  Lower Body Bathing Minimal assistance;Sit to/from stand;Sitting/lateral leans  Upper Body Dressing  Set up;Sitting  Lower Body Dressing Minimal assistance;Sitting/lateral leans;Sit to/from stand  Lower Body Dressing Details (indicate cue type and reason) due to weakness, decreased activity tolerance  Toilet Transfer Stand-pivot;Cueing for safety;BSC;RW;Minimal assistance  Toilet Transfer Details (indicate cue type and reason) to recliner, min A for safety due to decreased strength and activity tolerance  Toileting- Clothing Manipulation and Hygiene Minimal assistance;Sitting/lateral lean;Sit to/from stand  Functional mobility during ADLs Minimal assistance;Rolling walker  General ADL Comments patient reports feeling "a little weaker" than her baseline, desaturated to 83% on 8L with transfer to recliner. increased to 10 L after ~5 min seated rest in chair with O2 maintaining 85%. With additional ~5 min recovery able to wean back to 8L maintaining 90-91%  Bed Mobility  Overal bed mobility Needs Assistance  Bed Mobility Supine to Sit  Supine to sit [facility] elevated;Min assist  General bed mobility comments Min assist to raise trunk fully  Transfers  Overall transfer level Needs assistance  Equipment used Rolling walker (2 wheeled)  Transfers Sit to/from [facility]  Sit to Stand Min assist  Stand pivot transfers Min assist  General transfer comment cues to push from bed to stand, min A for safety due to weakness and limited activity tolerance  Balance  Overall balance assessment Needs assistance  Sitting-balance support Feet supported  Sitting balance-Leahy Scale Good  Standing balance support No upper extremity supported;Bilateral upper extremity supported  Standing balance-Leahy Scale Fair  Standing balance comment patient able to maintain static stand without UE support, utilized walker for transfer for safety  OT - End of Session  Equipment Utilized During Treatment Rolling walker;Oxygen  Activity Tolerance Patient limited by fatigue (significant coughing throughout session)  Patient left in chair;with call bell/phone within reach;with chair alarm set  Nurse Communication Mobility status;Other (comment) (O2)  OT Assessment  OT Recommendation/Assessment Patient needs continued OT Services  OT Visit Diagnosis Other abnormalities of gait and mobility (R26.89);Muscle weakness (generalized) (M62.81)  OT Problem List Decreased activity tolerance;Decreased strength;Decreased safety awareness  OT Plan  OT Frequency (ACUTE ONLY) Min 2X/week  OT Treatment/Interventions (ACUTE ONLY) Self-care/ADL training;Energy conservation;DME and/or AE instruction;Patient/family education;Balance training;Therapeutic activities  AM-PAC OT "6 Clicks" Daily Activity Outcome Measure (Version 2)  Help from another person eating meals? 4  Help from another person taking care of personal grooming? 3  Help from another person toileting, which includes using toliet, bedpan, or urinal? 3  Help from another person bathing (including washing, rinsing, drying)? 3  Help from another person to put on and taking off regular upper body  clothing? 3  Help from another person to put on and taking off regular lower body clothing? 3  6 Click Score 19  OT Recommendation  Follow Up Recommendations No OT follow up  OT Equipment None recommended by OT (patient reports has necessary equipment)  Individuals Consulted  Consulted and Agree with Results and Recommendations Patient  Acute Rehab OT Goals  Patient Stated Goal agreeable with up to chair  OT Goal Formulation With patient  Time For Goal Achievement 11/15/19  Potential to Achieve Goals Good  OT Time Calculation  OT Start Time (ACUTE ONLY) 0847  OT Stop Time (ACUTE ONLY) 0925  OT Time Calculation (min) 38 min  OT General Charges  $OT Visit 1 Visit  OT Evaluation  $OT Eval Moderate Complexity 1 Mod  OT Treatments  $Self Care/Home Management  23-37 mins  Written Expression  Dominant Hand Right   Delbert Phenix OT OT pager: [phone]

## 2019-11-01 NOTE — Progress Notes (Signed)
PT Cancellation Note  Patient Details Name: Erin Myers MRN: [id number] DOB: 08-05-1940   Cancelled Treatment:    Reason Eval/Treat Not Completed: Fatigue/lethargy limiting ability to participate Pt worked with OT this morning and has been in recliner for 6 hours.  Pt reports just recently returning to bed.  Pt fatigued and requests for PT to return tomorrow.  Will check back as schedule permits.   Ceria Suminski,KATHrine E 11/01/2019, 1:40 PM Arlyce Dice, DPT Acute Rehabilitation Services Pager: [phone] Office: [phone]

## 2019-11-01 NOTE — Assessment & Plan Note (Signed)
-   continue O2, escalate if needed but patient now DNR - continue abx, see sepsis

## 2019-11-01 NOTE — Assessment & Plan Note (Addendum)
-   continue Xarelto, dosing per pharmacy - PLTC <50k on 8/23; hold Xarelto for now and use HSQ; will resume when/if able

## 2019-11-01 NOTE — Assessment & Plan Note (Addendum)
-   organ dysfunction due to pneumonia in immunocompromised patient-tachycardic, tachypneic and leukopenic. Source of infection is pneumonia. Has AKI and mildly elevated troponin. -Broad-spectrum antibiotic with cefepime, vancomycin and doxycycline; on admission. - MRSA screen negative, will allow vanc to d/c - continue cefepime and doxy - follow up any pending cultures: remain negative  -BP remains soft; difficulty with giving much IV fluids due to respiratory status.  If initiating pressors, would also be poor prognostic indicator.  For now, will tolerate map of 60 or greater.  Continue above treatment.  Patient giving further consideration to hospice at this time

## 2019-11-01 NOTE — Hospital Course (Addendum)
Erin Myers is a 79 yo CF with PMH NSCLC, adenocarcinoma (chronic 8L O2 at home), DMII, HTN, OSA on CPAP, HTN, hypothyroidism, anxiety who presented with worsening SOB and hypoxia at home.  She has been on systemic chemo previously and per oncology is s/p 23 cycles at this point. Previous oncology notes also reviewed; she has been on single agent Alimta 500 mg IV q3 weeks.  She was also recently hospitalized 09/30/2019 until 10/12/2019 for ongoing respiratory distress, pneumonia, interstitial pneumonitis.  She was treated with antibiotics, steroids, and bronchodilators.  She was weaned down to 7 to 8 L nasal cannula oxygen by discharge. Her CXR on admission was actually improved in comparison to her last CXR on 10/06/2019. She was admitted and started on vancomycin, cefepime, doxycycline.  She was also noted to have an AKI on admission as well and started on IV fluids. The morning following admission bedside discussions were held regarding her code status.  We discussed this in detail once alone and second time with her husband bedside.  She elected for a DNR status and was able to state that she would not want to have any cardiac resuscitation nor ventilator support knowing that she would have extreme difficulty coming off the ventilator and that if her heart had stopped, that it was likely the sign of overall worsening functional status. She was also evaluated by oncology and palliative care. Patient has started to consider transitioning home with hospice if no significant improvement with treatment in the hospital.

## 2019-11-01 NOTE — Assessment & Plan Note (Addendum)
-   caution with IVF but given AKI and hypoTN will try IVF intermittently as able; may make respiratory status worse (discussed this with patient as well) -On 11/04/2019, discussed her worsening renal status likely in the setting of her not taking in much nutrition or liquids.  We also discussed the possibility of worsening her respiratory status if starting fluids.  We had been holding off on fluids for a couple days but now she is wanting to try fluids again so as to prevent further worsening of renal function.  We will try low rate fluids for 24 hours, if respiratory status worsens, will need to back off once again. -Repeat BMP in a.m.

## 2019-11-01 NOTE — Assessment & Plan Note (Addendum)
-   possibly multifactorial with malignancy and pre-renal - trend BMP

## 2019-11-01 NOTE — Assessment & Plan Note (Signed)
-   continue CPAP

## 2019-11-01 NOTE — Progress Notes (Signed)
PROGRESS NOTE    Erin Myers   YIR:[id number]  DOB: 1940/06/17  DOA: 10/31/2019     1  PCP: [name], MD  CC: SOB  Hospital Course: Erin Myers is a 79 yo CF with PMH NSCLC, adenocarcinoma (chronic 8L O2 at home), DMII, HTN, OSA on CPAP, HTN, hypothyroidism, anxiety who presented with worsening SOB and hypoxia at home.  She has been on systemic chemo previously and per oncology is s/p 23 cycles at this point. Previous oncology notes also reviewed; she has been on single agent Alimta 500 mg IV q3 weeks.  She was also recently hospitalized 09/30/2019 until 10/12/2019 for ongoing respiratory distress, pneumonia, interstitial pneumonitis.  She was treated with antibiotics, steroids, and bronchodilators.  She was weaned down to 7 to 8 L nasal cannula oxygen by discharge. Her CXR on admission was actually improved in comparison to her last CXR on 10/06/2019. She was admitted and started on vancomycin, cefepime, doxycycline.  She was also noted to have an AKI on admission as well and started on IV fluids. The morning following admission bedside discussions were held regarding her code status.  We discussed this in detail once alone and second time with her husband bedside.  She elected for a DNR status and was able to state that she would not want to have any cardiac resuscitation nor ventilator support knowing that she would have extreme difficulty coming off the ventilator and that if her heart had stopped, that it was likely the sign of overall worsening functional status. She was also evaluated by oncology and palliative care.   Interval History:  Admitted for worsening shortness of breath and increased oxygen demands at home.  She is admitted for treatment of recurrent pneumonia. We discussed CODE STATUS bedside this morning and she elected for DNR after extensive discussion.  She is also planning to talk with palliative care tomorrow with her husband and daughter to consider  possibly going home with hospice.  Old records reviewed in assessment of this patient  ROS: Constitutional: positive for fatigue and malaise, Respiratory: positive for Cough and shortness of breath, Cardiovascular: negative for chest pain and Gastrointestinal: negative for abdominal pain  Assessment & Plan: Severe sepsis with acute organ dysfunction ([place]) - organ dysfunction due to pneumonia in immunocompromised patient-tachycardic, tachypneic and leukopenic. Source of infection is pneumonia. Has AKI and mildly elevated troponin. -Broad-spectrum antibiotic with cefepime, vancomycin and doxycycline - MRSA screen negative, will allow vanc to d/c - continue cefepime and doxy - follow up any pending cultures - BP has been downtrending, especially DBP.  Husband states at home typically in the 16s.  After extensive bedside CODE STATUS discussion, she would still want pressors if needed for at least a short amount of time with then having further goals of care discussions.  Will give normal saline 500 cc bolus and follow-up blood pressure response.  If becomes short of breath or refractory to fluids, will need to consider initiating vasopressor support  Adenocarcinoma of lung, stage 4 (HCC) - patient has had 23 cycles of chemo at this point.  She has poor functional status with ongoing decline and has been on monotherapy Alimta.  She has not been a candidate for other treatments due to her functional status.  She is now dependent on 8 L nasal cannula oxygen at home and is now having multiple hospitalizations with worsening quality of life.  Bedside discussions held this morning with patient and her husband.  We discussed goals of care  and she then elected for becoming DNR after full explanation which she was able to explain back.  She would not want to have cardiac resuscitation or mechanical ventilation.  We discussed that this would mean DNR status but still treating reversible conditions if they would  make an impact on her quality of life; she was amenable to this. -She has also been seen by oncology and is considering going home with hospice.  Palliative care was consulted and is also planning for a family meeting on 11/02/2019  Acute respiratory failure with hypoxia ([place]) - continue O2, escalate if needed but patient now DNR - continue abx, see sepsis  AKI (acute kidney injury) ([place]) - caution with IVF but given AKI and hypoTN will try IVF as able; may make respiratory status worse (discussed this with patient as well)  Hyperkalemia - likely due to AKI; monitor and will treat as indicated  Hyponatremia - possibly multifactorial with malignancy and pre-renal - monitor Na while on IVF  Essential hypertension - currently hypotensive; hold meds - see sepsis  Diabetes mellitus without complication ([place]) - follow up A1c - continue SSI and CBGs  History of pulmonary embolism - continue Xarelto, dosing per pharmacy  Hypothyroid - continue synthroid  OSA on CPAP - continue CPAP   Antimicrobials: Doxycycline 11/01/2019>> present Cefepime 10/31/2019>> present Rocephin 10/31/2019 x 1 Azithromycin 10/31/2019 x 1 Vancomycin 10/31/2019 x 1   DVT prophylaxis: Xarelto Code Status: DNR Family Communication: Husband bedside Disposition Plan:  Status is: Inpatient  Remains inpatient appropriate because:Hemodynamically unstable, Unsafe d/c plan, IV treatments appropriate due to intensity of illness or inability to take PO and Inpatient level of care appropriate due to severity of illness   Dispo: The patient is from: Home              Anticipated d/c is to: Home              Anticipated d/c date is: 2 days              Patient currently is not medically stable to d/c.       Objective: Blood pressure (!) 96/35, pulse 100, temperature 98.9 F (37.2 C), temperature source Oral, resp. rate 18, height 5\' 5"  (1.651 m), weight 70 kg, SpO2 90 %.  Examination: General appearance:  Chronically ill-appearing elderly woman resting in bed in no distress Head: Normocephalic, without obvious abnormality, atraumatic Eyes: EOMI Lungs: Coarse breath sounds bilaterally, no obvious wheezing appreciated Heart: regular rate and rhythm and S1, S2 normal Abdomen: normal findings: bowel sounds normal and soft, non-tender Extremities: Trace lower extremity edema Skin: mobility and turgor normal Neurologic: Grossly normal  Consultants:   Oncology  Palliative care  Procedures:   None  Data Reviewed: I have personally reviewed following labs and imaging studies Results for orders placed or performed during the hospital encounter of 10/31/19 (from the past 24 hour(s))  MRSA PCR Screening     Status: None   Collection Time: 11/01/19  2:47 AM   Specimen: Nasal Mucosa; Nasopharyngeal  Result Value Ref Range   MRSA by PCR NEGATIVE NEGATIVE  Lactic acid, plasma     Status: None   Collection Time: 11/01/19  4:27 AM  Result Value Ref Range   Lactic Acid, Venous 0.7 0.5 - 1.9 mmol/L  Protime-INR     Status: Abnormal   Collection Time: 11/01/19  4:27 AM  Result Value Ref Range   Prothrombin Time 20.4 (H) 11.4 - 15.2 seconds   INR  1.8 (H) 0.8 - 1.2  Cortisol-am, blood     Status: None   Collection Time: 11/01/19  4:27 AM  Result Value Ref Range   Cortisol - AM 15.3 6.7 - 22.6 ug/dL  Procalcitonin     Status: None   Collection Time: 11/01/19  4:27 AM  Result Value Ref Range   Procalcitonin 1.75 ng/mL  CBC     Status: Abnormal   Collection Time: 11/01/19  4:27 AM  Result Value Ref Range   WBC 2.5 (L) 4.0 - 10.5 K/uL   RBC 3.38 (L) 3.87 - 5.11 MIL/uL   Hemoglobin 10.9 (L) 12.0 - 15.0 g/dL   HCT 34.1 (L) 36 - 46 %   MCV 100.9 (H) 80.0 - 100.0 fL   MCH 32.2 26.0 - 34.0 pg   MCHC 32.0 30.0 - 36.0 g/dL   RDW 14.0 11.5 - 15.5 %   Platelets 92 (L) 150 - 400 K/uL   nRBC 0.0 0.0 - 0.2 %  Creatinine, serum     Status: Abnormal   Collection Time: 11/01/19  4:27 AM  Result Value  Ref Range   Creatinine, Ser 1.43 (H) 0.44 - 1.00 mg/dL   GFR calc non Af Amer 35 (L) >60 mL/min   GFR calc Af Amer 41 (L) >60 mL/min    Recent Results (from the past 240 hour(s))  Culture, blood (routine x 2)     Status: None (Preliminary result)   Collection Time: 10/31/19  9:41 AM   Specimen: BLOOD LEFT FOREARM  Result Value Ref Range Status   Specimen Description   Final    BLOOD LEFT FOREARM Performed at [facility], [address], [place], [place] [address]    Special Requests   Final    BOTTLES DRAWN AEROBIC AND ANAEROBIC Blood Culture adequate volume Performed at [facility] [address] [place], [place] [address]    Culture   Final    NO GROWTH < 24 HOURS Performed at [facility] [address] [place], [place] [address]    Report Status PENDING  Incomplete  Culture, blood (routine x 2)     Status: None (Preliminary result)   Collection Time: 10/31/19 11:10 AM   Specimen: BLOOD  Result Value Ref Range Status   Specimen Description   Final    BLOOD RIGHT ANTECUBITAL Performed at [facility] [address], [place], [place] [address]    Special Requests   Final    BOTTLES DRAWN AEROBIC AND ANAEROBIC Blood Culture adequate volume Performed at [facility] [address], [place], [place] [address]    Culture   Final    NO GROWTH < 24 HOURS Performed at [facility] [address] [place], [place] [address]    Report Status PENDING  Incomplete  SARS Coronavirus 2 by RT PCR (hospital order, performed in [facility] hospital lab) Nasopharyngeal Nasopharyngeal Swab     Status: None   Collection Time: 10/31/19 12:08 PM   Specimen: Nasopharyngeal Swab  Result Value Ref Range Status   SARS Coronavirus 2 NEGATIVE NEGATIVE Final    Comment: (NOTE) SARS-CoV-2 target nucleic acids are NOT DETECTED.  The SARS-CoV-2 RNA is generally detectable in upper and lower respiratory specimens  during the acute phase of infection. The lowest concentration of SARS-CoV-2 viral copies this assay can detect is 250 copies / mL. A negative result does not preclude SARS-CoV-2 infection and should not be used  as the sole basis for treatment or other patient management decisions.  A negative result may occur with improper specimen collection / handling, submission of specimen other than nasopharyngeal swab, presence of viral mutation(s) within the areas targeted by this assay, and inadequate number of viral copies (<250 copies / mL). A negative result must be combined with clinical observations, patient history, and epidemiological information.  Fact Sheet for Patients:   StrictlyIdeas.no  Fact Sheet for Healthcare Providers: BankingDealers.co.za  This test is not yet approved or  cleared by the [place] FDA and has been authorized for detection and/or diagnosis of SARS-CoV-2 by FDA under an Emergency Use Authorization (EUA).  This EUA will remain in effect (meaning this test can be used) for the duration of the COVID-19 declaration under Section 564(b)(1) of the Act, 21 U.S.C. section 360bbb-3(b)(1), unless the authorization is terminated or revoked sooner.  Performed at [facility], [place] [address], [place], [place] [address]   MRSA PCR Screening     Status: None   Collection Time: 11/01/19  2:47 AM   Specimen: Nasal Mucosa; Nasopharyngeal  Result Value Ref Range Status   MRSA by PCR NEGATIVE NEGATIVE Final    Comment:        The GeneXpert MRSA Assay (FDA approved for NASAL specimens only), is one component of a comprehensive MRSA colonization surveillance program. It is not intended to diagnose MRSA infection nor to guide or monitor treatment for MRSA infections. Performed at [facility], [place] [address] [place], [place] [address]      Radiology Studies: DG Chest Port 1  View  Result Date: 10/31/2019 CLINICAL DATA:  Shortness of breath cough EXAM: PORTABLE CHEST 1 VIEW COMPARISON:  10/06/2019 FINDINGS: RIGHT-sided Port-A-Cath remains in place terminating in the upper RIGHT atrium. Trachea midline. Cardiomediastinal contours and hilar structures are unremarkable. Persistent interstitial and airspace disease in the RIGHT chest predominantly throughout the RIGHT chest also in the LEFT lung base. On limited assessment visualized skeletal structures without acute process. IMPRESSION: Persistent bilateral airspace and interstitial changes without significant change since prior imaging suspicious for pneumonia. Asymmetric edema could have a similar appearance though prior cross-sectional imaging appeared more compatible with pneumonia. Possible small effusions. Electronically Signed   By: Zetta Bills M.D.   On: 10/31/2019 10:16   DG Chest Port 1 View  Final Result       Scheduled Meds: . Chlorhexidine Gluconate Cloth  6 each Topical Daily  . doxycycline  100 mg Oral Q12H  . insulin aspart  0-9 Units Subcutaneous TID AC & HS  . ipratropium-albuterol  3 mL Nebulization TID  . levothyroxine  112 mcg Oral Daily  . mouth rinse  15 mL Mouth Rinse BID  . rivaroxaban  20 mg Oral Q supper  . sodium chloride flush  10-40 mL Intracatheter Q12H   PRN Meds: acetaminophen **OR** acetaminophen, bisacodyl, guaiFENesin-dextromethorphan, ipratropium-albuterol, ondansetron **OR** ondansetron (ZOFRAN) IV, polyethylene glycol, sodium chloride flush, traZODone Continuous Infusions: . ceFEPime (MAXIPIME) IV 2 g (11/01/19 1617)      LOS: 1 day  Time spent: Greater than 50% of the 35 minute visit was spent in counseling/coordination of care for the patient as laid out in the A&P.   Dwyane Dee, MD [facility] 11/01/2019, 4:47 PM   Contact via secure chat.  To contact the attending provider between 7A-7P or the covering provider during after hours 7P-7A, please log  into the web site [email or web address] and access using universal [place] password  for that web site. If you do not have the password, please call the hospital operator.

## 2019-11-01 NOTE — Assessment & Plan Note (Signed)
-   likely due to AKI; monitor and will treat as indicated

## 2019-11-01 NOTE — Progress Notes (Signed)
HEMATOLOGY-ONCOLOGY PROGRESS NOTE  SUBJECTIVE: The patient presented to the emergency room with cough and shortness of breath.  Chest x-ray on admission showed worsening bilateral infiltrate concerning for pneumonia.  Code sepsis was activated.  Has received IV fluids and started on IV antibiotics.  When seen today, the patient is sitting up in the recliner chair.  She appears to be very short of breath.  Husband is at the bedside.  500 cc fluid bolus running at the time my visit.  Currently on O2 8 L/min.  Has ongoing shortness of breath and cough with clear sputum production.  No hemoptysis.  Denies chest pain.  Denies abdominal pain, nausea, vomiting.  The patient tells me today that she would not want CPR or to be placed on a ventilator.  This had previously been discussed with the hospitalist prior to my visit and she has been appropriately been made a DNR.  Husband states that they are unsure if they want further chemotherapy and are considering hospice.  They have asked me to explain how hospice works.  Oncology History  Adenocarcinoma of right lung, stage 4 ([place])  07/31/2017 Initial Diagnosis   Adenocarcinoma of right lung, stage 4 ([place])   08/18/2017 - 08/28/2019 Chemotherapy   The patient had palonosetron (ALOXI) injection 0.25 mg, 0.25 mg, Intravenous,  Once, 4 of 4 cycles Administration: 0.25 mg (08/18/2017), 0.25 mg (09/07/2017), 0.25 mg (09/28/2017), 0.25 mg (10/19/2017) PEMEtrexed (ALIMTA) 1,000 mg in sodium chloride 0.9 % 100 mL chemo infusion, 485 mg/m2 = 1,025 mg, Intravenous,  Once, 15 of 15 cycles Administration: 1,000 mg (08/18/2017), 1,000 mg (09/07/2017), 1,000 mg (09/28/2017), 1,000 mg (10/19/2017), 1,000 mg (11/09/2017), 1,000 mg (11/30/2017), 1,000 mg (12/19/2017), 1,000 mg (01/11/2018), 1,000 mg (02/01/2018), 1,000 mg (02/22/2018), 1,000 mg (03/15/2018), 1,000 mg (04/05/2018), 1,000 mg (04/26/2018), 1,000 mg (05/16/2018), 1,000 mg (06/07/2018) CARBOplatin (PARAPLATIN) 490 mg in sodium chloride 0.9 %  250 mL chemo infusion, 490 mg (100 % of original dose 489.5 mg), Intravenous,  Once, 4 of 4 cycles Dose modification: 489.5 mg (original dose 489.5 mg, Cycle 1), 489.5 mg (original dose 489.5 mg, Cycle 2), 489.5 mg (original dose 489.5 mg, Cycle 3), 489.5 mg (original dose 489.5 mg, Cycle 4) Administration: 490 mg (08/18/2017), 490 mg (09/07/2017), 490 mg (09/28/2017), 490 mg (10/19/2017) pembrolizumab (KEYTRUDA) 200 mg in sodium chloride 0.9 % 50 mL chemo infusion, 200 mg, Intravenous, Once, 23 of 28 cycles Administration: 200 mg (08/18/2017), 200 mg (11/09/2017), 200 mg (09/07/2017), 200 mg (11/30/2017), 200 mg (09/28/2017), 200 mg (10/19/2017), 200 mg (12/19/2017), 200 mg (01/11/2018), 200 mg (02/01/2018), 200 mg (02/22/2018), 200 mg (03/15/2018), 200 mg (04/05/2018), 200 mg (04/26/2018), 200 mg (05/16/2018), 200 mg (06/07/2018), 200 mg (02/12/2019), 200 mg (03/05/2019), 200 mg (03/26/2019), 200 mg (04/16/2019), 200 mg (05/07/2019), 200 mg (05/28/2019), 200 mg (06/17/2019), 200 mg (08/28/2019) fosaprepitant (EMEND) 150 mg, dexamethasone (DECADRON) 12 mg in sodium chloride 0.9 % 145 mL IVPB, , Intravenous,  Once, 4 of 4 cycles Administration:  (08/18/2017),  (09/07/2017),  (09/28/2017),  (10/19/2017)  for chemotherapy treatment.    10/24/2019 -  Chemotherapy   The patient had PEMEtrexed (ALIMTA) 900 mg in sodium chloride 0.9 % 100 mL chemo infusion, 500 mg/m2 = 900 mg, Intravenous,  Once, 1 of 9 cycles Administration: 900 mg (10/24/2019)  for chemotherapy treatment.       REVIEW OF SYSTEMS:   Constitutional: Currently afebrile Respiratory: Still with significant shortness of breath and cough Cardiovascular: Denies palpitation, chest discomfort Gastrointestinal:  Denies nausea, heartburn or change  in bowel habits Skin: Denies abnormal skin rashes Lymphatics: Denies new lymphadenopathy or easy bruising Neurological:Denies numbness, tingling or new weaknesses Behavioral/Psych: Mood is stable, no new changes  Extremities: No lower  extremity edema All other systems were reviewed with the patient and are negative.  I have reviewed the past medical history, past surgical history, social history and family history with the patient and they are unchanged from previous note.   PHYSICAL EXAMINATION: ECOG PERFORMANCE STATUS: 3 - Symptomatic, >50% confined to bed  Vitals:   11/01/19 0825 11/01/19 0900  BP:  (!) 119/42  Pulse:  (!) 103  Resp:  20  Temp: 99.1 F (37.3 C)   SpO2:  93%   Filed Weights   10/31/19 0941 11/01/19 0241  Weight: 72.5 kg 70 kg    Intake/Output from previous day: 08/19 0701 - 08/20 0700 In: 3154.4 [P.O.:120; IV Piggyback:3034.4] Out: 250 [Urine:250]  GENERAL: Sitting up in recliner, appears short of breath LUNGS: Bilateral rales and rhonchi HEART: regular rate & rhythm and no murmurs and no lower extremity edema ABDOMEN:abdomen soft, non-tender and normal bowel sounds NEURO: alert & oriented x 3 with fluent speech, no focal motor/sensory deficits  LABORATORY DATA:  I have reviewed the data as listed CMP Latest Ref Rng & Units 11/01/2019 10/31/2019 10/24/2019  Glucose 70 - 99 mg/dL - 135(H) 151(H)  BUN 8 - 23 mg/dL - 52(H) 41(H)  Creatinine 0.44 - 1.00 mg/dL 1.43(H) 2.04(H) 1.25(H)  Sodium 135 - 145 mmol/L - 134(L) 140  Potassium 3.5 - 5.1 mmol/L - 5.4(H) 4.7  Chloride 98 - 111 mmol/L - 92(L) 101  CO2 22 - 32 mmol/L - 27 29  Calcium 8.9 - 10.3 mg/dL - 9.4 10.5(H)  Total Protein 6.5 - 8.1 g/dL - - 7.1  Total Bilirubin 0.3 - 1.2 mg/dL - - <0.2(L)  Alkaline Phos 38 - 126 U/L - - 74  AST 15 - 41 U/L - - 8(L)  ALT 0 - 44 U/L - - 8    Lab Results  Component Value Date   WBC 2.5 (L) 11/01/2019   HGB 10.9 (L) 11/01/2019   HCT 34.1 (L) 11/01/2019   MCV 100.9 (H) 11/01/2019   PLT 92 (L) 11/01/2019   NEUTROABS 1.4 (L) 10/31/2019    DG Chest Port 1 View  Result Date: 10/31/2019 CLINICAL DATA:  Shortness of breath cough EXAM: PORTABLE CHEST 1 VIEW COMPARISON:  10/06/2019 FINDINGS:  RIGHT-sided Port-A-Cath remains in place terminating in the upper RIGHT atrium. Trachea midline. Cardiomediastinal contours and hilar structures are unremarkable. Persistent interstitial and airspace disease in the RIGHT chest predominantly throughout the RIGHT chest also in the LEFT lung base. On limited assessment visualized skeletal structures without acute process. IMPRESSION: Persistent bilateral airspace and interstitial changes without significant change since prior imaging suspicious for pneumonia. Asymmetric edema could have a similar appearance though prior cross-sectional imaging appeared more compatible with pneumonia. Possible small effusions. Electronically Signed   By: Zetta Bills M.D.   On: 10/31/2019 10:16   DG CHEST PORT 1 VIEW  Result Date: 10/06/2019 CLINICAL DATA:  Respiratory distress, oxygen desaturation EXAM: PORTABLE CHEST 1 VIEW COMPARISON:  10/05/2019 FINDINGS: Slight interval increase in heterogeneous airspace opacity of the right lung, particularly in the right upper lobe. Unchanged heterogeneous opacity elsewhere, with small, layering bilateral pleural effusions. Right chest port catheter. Heart and mediastinum are unremarkable. IMPRESSION: 1. Slight interval increase in heterogeneous airspace opacity of the right lung, particularly in the right upper lobe, consistent with worsened  edema and/or infection. 2. Unchanged heterogeneous opacity elsewhere, with small, layering bilateral pleural effusions. Electronically Signed   By: Eddie Candle M.D.   On: 10/06/2019 10:09   DG CHEST PORT 1 VIEW  Result Date: 10/05/2019 CLINICAL DATA:  Acute respiratory distress. EXAM: PORTABLE CHEST 1 VIEW COMPARISON:  10/04/2019 FINDINGS: Right IJ Port-A-Cath unchanged. Lungs are adequately inflated demonstrate persistent hazy airspace opacification over the mid to lower lungs bilaterally and right upper lobe likely due to multifocal infection. Cardiomediastinal silhouette and remainder of the  exam is unchanged. IMPRESSION: Persistent bilateral hazy airspace process unchanged and likely due to multifocal infection. Right IJ Port-A-Cath unchanged. Electronically Signed   By: Marin Olp M.D.   On: 10/05/2019 12:04   DG CHEST PORT 1 VIEW  Result Date: 10/04/2019 CLINICAL DATA:  Status post bronchoscopy with biopsy EXAM: PORTABLE CHEST 1 VIEW COMPARISON:  09/30/2019, CT chest 09/30/2019 FINDINGS: Right-sided central venous port tip over the right atrium. Progression of ground-glass disease in the right thorax. Patchy consolidations and ground-glass opacities on the left are grossly unchanged. No pleural effusion or pneumothorax is seen. Stable cardiomediastinal silhouette. IMPRESSION: Progression of ground-glass disease in the right thorax. Grossly stable patchy consolidations and ground-glass opacities in the left lung. Electronically Signed   By: Donavan Foil M.D.   On: 10/04/2019 17:52   DG C-ARM BRONCHOSCOPY  Result Date: 10/04/2019 C-ARM BRONCHOSCOPY: Fluoroscopy was utilized by the requesting physician.  No radiographic interpretation.    ASSESSMENT AND PLAN: This is a pleasant 79 year old Caucasian female with stage IV non-small cell lung cancer, well differentiated adenocarcinoma.  The patient presented with multifocal disease involving mainly the right upper lobe as well as the right middle lobe and left lower lobe.  Diagnosed in May 2019.  No actionable mutations.    She received systemic chemotherapy with carboplatin, Alimta, and Keytruda and then starting from cycle #5 we will start on maintenance Alimta and Keytruda.  Alimta was discontinued secondary to intolerance with fatigue.  She requested take a break after cycle #15.  She received her treatment with single agent Keytruda in December 2020 with cycle #16.  She is status post 23 cycles of treatment.  At her last visit in our office on 10/17/2019, treatment options were discussed with her including a referral to palliative  care/hospice versus continuing on single agent Alimta 500 mg IV every 3 weeks.  Docetaxel and Cyramza were also considered but due to her performance status, she was not a candidate for these agents.  The patient opted to proceed with single agent Alimta and received this on 10/24/2019.  She is also awaiting a second opinion at [facility].  The patient is now in the stepdown unit with increased O2 requirements from baseline.  The patient is unsure at this time if she would consider any additional chemotherapy.  We discussed goals of care today.  She confirms to me that she would not want CPR or mechanical ventilation.  Order for DNR was placed earlier today.  We agree with this decision.  The patient is considering hospice.  We talked about the services that are offered by hospice and that she can consider going home with support of hospice in her home and that they would bring durable medical equipment versus residential hospice.  At this point, she does not want to make a definitive decision and would like to see how she sponsor treatment over the next few days.  The patient and her husband are agreeable to talking with  the palliative care team for ongoing discussions of goals of care and disposition plans.  I have placed this order today.   LOS: 1 day   Mikey Bussing, DNP, AGPCNP-BC, AOCNP 11/01/19

## 2019-11-01 NOTE — Progress Notes (Signed)
Provider on call paged about soft BP with MAP 55-60. DBP is also low, 30-45, while at the ICU. Patient is A/O, making urine.

## 2019-11-01 NOTE — Consult Note (Signed)
Consultation Note Date: 11/01/2019   Patient Name: Erin Myers  DOB: 1940-07-04  MRN: [id number]  Age / Sex: 79 y.o., female  PCP: [name], MD Referring Physician: Dwyane Dee, MD  Reason for Consultation: Establishing goals of care  HPI/Patient Profile: 79 y.o. female  with past medical history of NSCLC on 8L oxygen baseline, T2DM, ovarian cancer, HTN, OSA on CPAP, HTN, hypothyroidism, and anxiety admitted on 10/31/2019 with pneumonia. She was recently hospitalized 7/19-7/31 for respiratory failure and pneumonia. She resumed chemo 8/21.  The patient has expressed a desire for DNR status and is considering discontinuing chemo, considering hospice. PMT referral to discuss hospice and [facility].   Clinical Assessment and Goals of Care: I have reviewed medical records including EPIC notes, labs and imaging, received report from RN, assessed the patient and then met with patient and spouse to discuss diagnosis prognosis, GOC, EOL wishes, disposition and options.  I introduced Palliative Medicine as specialized medical care for people living with serious illness. It focuses on providing relief from the symptoms and stress of a serious illness. The goal is to improve quality of life for both the patient and the family. Family is familiar with our services - they met with [place] in July.   Patient had significant conversation with oncology earlier today and so today they are still processing options and would like to see how she does over a couple of days. We did discuss that they are considering continuing chemo vs going home with hospice.   We planned to further discuss goals of care - they ask if daughter can be involved in that conversation - approved by Database administrator. Will plan to meet with patient, spouse, and daughter 8/21 10AM  Discussed with patient/family the importance of continued conversation with family and the medical providers  regarding overall plan of care and treatment options, ensuring decisions are within the context of the patient's values and GOCs.    Questions and concerns were addressed. The family was encouraged to call with questions or concerns.   Primary Decision Maker PATIENT   SUMMARY OF RECOMMENDATIONS   - ongoing [place] conversations - considering hospice - f//u meeting 8/21 10 am  Code Status/Advance Care Planning:  DNR  Discharge Planning: To Be Determined      Primary Diagnoses: Present on Admission: . Severe sepsis with acute organ dysfunction ([place])   I have reviewed the medical record, interviewed the patient and family, and examined the patient. The following aspects are pertinent.  Past Medical History:  Diagnosis Date  . Anxiety   . Diabetes mellitus without complication ([place])   . Dyslipidemia   . HTN (hypertension)   . Hyperlipemia   . Hypothyroid   . Hypothyroidism   . Lung cancer ([place]) 2000   Carcinoid, s/p RLL lobectomy  . Obesity   . Ovarian cancer ([place])   . Palpitations 2009 and 2006   Negative nuclear stress test   . Sleep apnea    on C-pap   Social History   Socioeconomic History  . Marital status: Married    Spouse name: Not on file  . Number of children: 2  . Years of education: HS  . Highest education level: Not on file  Occupational History  . Not on file  Tobacco Use  . Smoking status: Former Smoker    Packs/day: 0.75    Years: 30.00    Pack years: 22.50    Types: Cigarettes    Quit date: 08/18/1991  Years since quitting: 28.2  . Smokeless tobacco: Never Used  Substance and Sexual Activity  . Alcohol use: No    Alcohol/week: 0.0 standard drinks  . Drug use: No  . Sexual activity: Yes    Partners: Male  Other Topics Concern  . Not on file  Social History Narrative   1-2 glasses of tea a day    Married mother of 2 (daughter close to 58 and son 45-44 years old).  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   Social Determinants of Health   Financial Resource Strain:   . Difficulty of Paying Living Expenses: Not on file  Food Insecurity:   . Worried About Charity fundraiser in the Last Year: Not on file  . Ran Out of Food in the Last Year: Not on file  Transportation Needs:   . Lack of Transportation (Medical): Not on file  . Lack of Transportation (Non-Medical): Not on file  Physical Activity:   . Days of Exercise per Week: Not on file  . Minutes of Exercise per Session: Not on file  Stress:   . Feeling of Stress : Not on file  Social Connections:   . Frequency of Communication with Friends and Family: Not on file  . Frequency of Social Gatherings with Friends and Family: Not on file  . Attends Religious Services: Not on file  . Active Member of Clubs or Organizations: Not on file  . Attends Archivist Meetings: Not on file  . Marital Status: Not on file   Family History  Problem Relation Age of Onset  . Coronary artery disease Mother 75       died of an MI  . Heart attack Mother   . Suicidality Sister   . CVA Brother 17  . CVA Father    Scheduled Meds: . Chlorhexidine Gluconate Cloth  6 each Topical Daily  . doxycycline  100 mg Oral Q12H  . ipratropium-albuterol  3 mL Nebulization TID  . levothyroxine  112 mcg Oral Daily  . mouth rinse  15 mL Mouth Rinse BID  . rivaroxaban  20 mg Oral Q supper  . sodium chloride flush  10-40 mL Intracatheter Q12H   Continuous Infusions: . ceFEPime (MAXIPIME) IV     PRN Meds:.acetaminophen **OR** acetaminophen, bisacodyl, guaiFENesin-dextromethorphan, ipratropium-albuterol, ondansetron **OR** ondansetron (ZOFRAN) IV, polyethylene glycol, sodium chloride flush, traZODone Allergies  Allergen Reactions  . Pembrolizumab Anaphylaxis  . Adhesive [Tape] Other (See Comments)    Irritates skin   . Codeine Other (See Comments)    Skin crawlinf feeling  . Lactose Intolerance (Gi) Diarrhea  .  Monosodium Glutamate Diarrhea  . Sulfa Antibiotics Nausea And Vomiting   Review of Systems  Constitutional: Positive for activity change and fatigue.  Respiratory: Positive for shortness of breath.   Neurological: Positive for weakness.    Physical Exam Constitutional:      General: She is not in acute distress. Pulmonary:     Effort: Tachypnea present.  Skin:    General: Skin is warm and dry.  Neurological:     Mental Status: She is alert and oriented to person, place, and time.     Vital Signs: BP (!) 102/42 (BP Location: Right Arm)   Pulse (!) 104   Temp 98.9 F (37.2 C) (Oral)   Resp (!) 21   Ht _0  (1.651 m)   Wt 70 kg   SpO2 93%  BMI 25.68 kg/m  Pain Scale: 0-10   Pain Score: 0-No pain   SpO2: SpO2: 93 % O2 Device:SpO2: 93 % O2 Flow Rate: .O2 Flow Rate (L/min): 8 L/min  IO: Intake/output summary:   Intake/Output Summary (Last 24 hours) at 11/01/2019 1452 Last data filed at 11/01/2019 1304 Gross per 24 hour  Intake 1054.35 ml  Output 250 ml  Net 804.35 ml    LBM: Last BM Date: 10/30/19 Baseline Weight: Weight: 72.5 kg Most recent weight: Weight: 70 kg     Palliative Assessment/Data: PPS 50%    Time Total: 30 minutes Greater than 50%  of this time was spent counseling and coordinating care related to the above assessment and plan.  Juel Burrow, DNP, AGNP-C Palliative Medicine Team [phone] Pager: [phone]

## 2019-11-01 NOTE — Progress Notes (Signed)
BP at 1100 was 116/24. HR 112.Other VSS. Retook at 1104,BP was 116/39. HR 112. Other VSS. Pt asymptomatic. Ashok Pall notified.

## 2019-11-01 NOTE — Assessment & Plan Note (Addendum)
-   follow up A1c = 5.9% - continue SSI and CBGs

## 2019-11-01 NOTE — ED Notes (Signed)
Pure wik has been applied

## 2019-11-01 NOTE — Progress Notes (Signed)
Pharmacy Antibiotic Note  Erin Myers is a 79 y.o. female admitted on 10/31/2019 with severe sepsis secondary to pneumonia. Pharmacy has been consulted for Vancomycin and Cefepime dosing. Patient also placed on Doxycycline per MD.   Plan: With improved SCr, change cefepime from 2g IV q24h to 2g IV q12 Vanc has been d/c'd with negative MRSA PCR result Monitor renal function, cultures, clinical course   Height: 5\' 5"  (165.1 cm) Weight: 70 kg (154 lb 5.2 oz) IBW/kg (Calculated) : 57  Temp (24hrs), Avg:99 F (37.2 C), Min:98.9 F (37.2 C), Max:99.1 F (37.3 C)  Recent Labs  Lab 10/31/19 0941 10/31/19 1117 11/01/19 0427  WBC 1.9*  --  2.5*  CREATININE 2.04*  --  1.43*  LATICACIDVEN  --  1.0 0.7    Estimated Creatinine Clearance: 32.9 mL/min (A) (by C-G formula based on SCr of 1.43 mg/dL (H)).    Allergies  Allergen Reactions  . Pembrolizumab Anaphylaxis  . Adhesive [Tape] Other (See Comments)    Irritates skin   . Codeine Other (See Comments)    Skin crawlinf feeling  . Lactose Intolerance (Gi) Diarrhea  . Monosodium Glutamate Diarrhea  . Sulfa Antibiotics Nausea And Vomiting    Antimicrobials this admission: 8/19 Ceftriaxone x 1 8/19 Azithromycin x 1 8/19 Vancomycin >> 8/20 8/19 Cefepime >> 8/20 Doxycycline >> (8/24)  Dose adjustments this admission: --  Microbiology results: 8/19 BCx: ngtd 8/19 COVID: negative   Thank you for allowing pharmacy to be a part of this patient's care.   Adrian Saran, PharmD, BCPS 11/01/2019 12:29 PM

## 2019-11-02 DIAGNOSIS — R0602 Shortness of breath: Secondary | ICD-10-CM

## 2019-11-02 LAB — CBC WITH DIFFERENTIAL/PLATELET
Abs Immature Granulocytes: 0.08 10*3/uL — ABNORMAL HIGH (ref 0.00–0.07)
Basophils Absolute: 0 10*3/uL (ref 0.0–0.1)
Basophils Relative: 0 %
Eosinophils Absolute: 0.1 10*3/uL (ref 0.0–0.5)
Eosinophils Relative: 1 %
HCT: 33.8 % — ABNORMAL LOW (ref 36.0–46.0)
Hemoglobin: 10.5 g/dL — ABNORMAL LOW (ref 12.0–15.0)
Immature Granulocytes: 2 %
Lymphocytes Relative: 11 %
Lymphs Abs: 0.5 10*3/uL — ABNORMAL LOW (ref 0.7–4.0)
MCH: 31.8 pg (ref 26.0–34.0)
MCHC: 31.1 g/dL (ref 30.0–36.0)
MCV: 102.4 fL — ABNORMAL HIGH (ref 80.0–100.0)
Monocytes Absolute: 0.2 10*3/uL (ref 0.1–1.0)
Monocytes Relative: 4 %
Neutro Abs: 3.9 10*3/uL (ref 1.7–7.7)
Neutrophils Relative %: 82 %
Platelets: 72 10*3/uL — ABNORMAL LOW (ref 150–400)
RBC: 3.3 MIL/uL — ABNORMAL LOW (ref 3.87–5.11)
RDW: 14.2 % (ref 11.5–15.5)
WBC: 4.7 10*3/uL (ref 4.0–10.5)
nRBC: 0 % (ref 0.0–0.2)

## 2019-11-02 LAB — GLUCOSE, CAPILLARY
Glucose-Capillary: 110 mg/dL — ABNORMAL HIGH (ref 70–99)
Glucose-Capillary: 94 mg/dL (ref 70–99)
Glucose-Capillary: 85 mg/dL (ref 70–99)
Glucose-Capillary: 88 mg/dL (ref 70–99)

## 2019-11-02 LAB — BASIC METABOLIC PANEL
Anion gap: 10 (ref 5–15)
BUN: 38 mg/dL — ABNORMAL HIGH (ref 8–23)
CO2: 24 mmol/L (ref 22–32)
Calcium: 8.6 mg/dL — ABNORMAL LOW (ref 8.9–10.3)
Chloride: 102 mmol/L (ref 98–111)
Creatinine, Ser: 1.59 mg/dL — ABNORMAL HIGH (ref 0.44–1.00)
GFR calc Af Amer: 36 mL/min — ABNORMAL LOW (ref >60)
GFR calc non Af Amer: 31 mL/min — ABNORMAL LOW (ref >60)
Glucose, Bld: 105 mg/dL — ABNORMAL HIGH (ref 70–99)
Potassium: 4.5 mmol/L (ref 3.5–5.1)
Sodium: 136 mmol/L (ref 135–145)

## 2019-11-02 LAB — MAGNESIUM: Magnesium: 2 mg/dL (ref 1.7–2.4)

## 2019-11-02 MED ORDER — SALINE SPRAY 0.65 % NA SOLN
1.0000 | NASAL | Status: DC | PRN
  Filled 2019-11-02: qty 44

## 2019-11-02 MED ORDER — DIPHENHYDRAMINE-ZINC ACETATE 2-0.1 % EX CREA
TOPICAL_CREAM | Freq: Three times a day (TID) | CUTANEOUS | Status: DC | PRN
  Administered 2019-11-04 – 2019-11-05 (×2): 1 via TOPICAL
  Filled 2019-11-02: qty 28

## 2019-11-02 NOTE — Progress Notes (Signed)
PROGRESS NOTE    Erin Myers   HYI:[id number]  DOB: [birth year]  DOA: 10/31/2019     2  PCP: Erin Battles, MD  CC: SOB  Hospital Course: Erin Myers is a 79 yo CF with PMH NSCLC, adenocarcinoma (chronic 8L O2 at home), DMII, HTN, OSA on CPAP, HTN, hypothyroidism, anxiety who presented with worsening SOB and hypoxia at home.  She has been on systemic chemo previously and per oncology is s/p 23 cycles at this point. Previous oncology notes also reviewed; she has been on single agent Alimta 500 mg IV q3 weeks.  She was also recently hospitalized 09/30/2019 until 10/12/2019 for ongoing respiratory distress, pneumonia, interstitial pneumonitis.  She was treated with antibiotics, steroids, and bronchodilators.  She was weaned down to 7 to 8 L nasal cannula oxygen by discharge. Her CXR on admission was actually improved in comparison to her last CXR on 10/06/2019. She was admitted and started on vancomycin, cefepime, doxycycline.  She was also noted to have an AKI on admission as well and started on IV fluids. The morning following admission bedside discussions were held regarding her code status.  We discussed this in detail once alone and second time with her husband bedside.  She elected for a DNR status and was able to state that she would not want to have any cardiac resuscitation nor ventilator support knowing that she would have extreme difficulty coming off the ventilator and that if her heart had stopped, that it was likely the sign of overall worsening functional status. She was also evaluated by oncology and palliative care. Patient has started to consider transitioning home with hospice if no significant improvement with treatment in the hospital.    Interval History:  Admitted for worsening shortness of breath and increased oxygen demands at home.  She is admitted for treatment of recurrent pneumonia. No acute events overnight.  She is resting in bed comfortable and no distress  this morning however does have a very congested sounding cough.  She reiterates that yesterday she has started giving some consideration for hospice however still wishes to see how she does with treatment over the next couple days.  Old records reviewed in assessment of this patient  ROS: Constitutional: positive for fatigue and malaise, Respiratory: positive for Cough and shortness of breath, Cardiovascular: negative for chest pain and Gastrointestinal: negative for abdominal pain  Assessment & Plan: Severe sepsis with acute organ dysfunction ([place]) - organ dysfunction due to pneumonia in immunocompromised patient-tachycardic, tachypneic and leukopenic. Source of infection is pneumonia. Has AKI and mildly elevated troponin. -Broad-spectrum antibiotic with cefepime, vancomycin and doxycycline; on admission. - MRSA screen negative, will allow vanc to d/c - continue cefepime and doxy - follow up any pending cultures -BP remains soft; difficulty with giving much IV fluids due to respiratory status.  If initiating pressors, would also be poor prognostic indicator.  For now, will tolerate map of 60 or greater.  Continue above treatment.  Patient giving further consideration to hospice at this time  Adenocarcinoma of lung, stage 4 ([place]) - patient has had 23 cycles of chemo at this point.  She has poor functional status with ongoing decline and has been on monotherapy Alimta.  She has not been a candidate for other treatments due to her functional status.  She is now dependent on 8 L nasal cannula oxygen at home and is now having multiple hospitalizations with worsening quality of life.  Bedside discussions held this morning with patient and her husband.  We discussed goals of care and she then elected for becoming DNR after full explanation which she was able to explain back.  She would not want to have cardiac resuscitation or mechanical ventilation.  We discussed that this would mean DNR status but still  treating reversible conditions if they would make an impact on her quality of life; she was amenable to this. -She has also been seen by oncology and is considering going home with hospice.  Palliative care following; greatly appreciate assistance.  Ongoing goals of care discussions being held  Acute respiratory failure with hypoxia ([place]) - continue O2, escalate if needed but patient now DNR - continue abx, see sepsis  AKI (acute kidney injury) ([place]) - caution with IVF but given AKI and hypoTN will try IVF intermittently as able; may make respiratory status worse (discussed this with patient as well)  Hyperkalemia - likely due to AKI; monitor and will treat as indicated  Hyponatremia - possibly multifactorial with malignancy and pre-renal - monitor Na while on IVF  Essential hypertension - currently hypotensive; hold meds - see sepsis  Diabetes mellitus without complication ([place]) - follow up A1c = 5.9% - continue SSI and CBGs  History of pulmonary embolism - continue Xarelto, dosing per pharmacy  Hypothyroid - continue synthroid  OSA on CPAP - continue CPAP   Antimicrobials: Doxycycline 11/01/2019>> present Cefepime 10/31/2019>> present Rocephin 10/31/2019 x 1 Azithromycin 10/31/2019 x 1 Vancomycin 10/31/2019 x 1   DVT prophylaxis: Xarelto Code Status: DNR Family Communication: Husband bedside Disposition Plan:  Status is: Inpatient  Remains inpatient appropriate because:Hemodynamically unstable, Unsafe d/c plan, IV treatments appropriate due to intensity of illness or inability to take PO and Inpatient level of care appropriate due to severity of illness   Dispo: The patient is from: Home              Anticipated d/c is to: Home              Anticipated d/c date is: 2 days              Patient currently is not medically stable to d/c.  Objective: Blood pressure (!) 110/39, pulse (!) 111, temperature 98.3 F (36.8 C), temperature source Oral, resp. rate 20,  height 5\' 5"  (1.651 m), weight 70 kg, SpO2 (!) 88 %.  Examination: General appearance: Chronically ill-appearing elderly woman resting in bed in no distress Head: Normocephalic, without obvious abnormality, atraumatic Eyes: EOMI Lungs: Coarse breath sounds bilaterally, no obvious wheezing appreciated Heart: regular rate and rhythm and S1, S2 normal Abdomen: normal findings: bowel sounds normal and soft, non-tender Extremities: Trace lower extremity edema Skin: mobility and turgor normal Neurologic: Grossly normal  Consultants:   Oncology  Palliative care  Procedures:   None  Data Reviewed: I have personally reviewed following labs and imaging studies Results for orders placed or performed during the hospital encounter of 10/31/19 (from the past 24 hour(s))  Glucose, capillary     Status: None   Collection Time: 11/01/19  5:16 PM  Result Value Ref Range   Glucose-Capillary 88 70 - 99 mg/dL  Glucose, capillary     Status: None   Collection Time: 11/01/19  9:50 PM  Result Value Ref Range   Glucose-Capillary 85 70 - 99 mg/dL  Glucose, capillary     Status: Abnormal   Collection Time: 11/02/19  7:38 AM  Result Value Ref Range   Glucose-Capillary 110 (H) 70 - 99 mg/dL   Comment 1 Notify RN  Comment 2 Document in Chart   Basic metabolic panel     Status: Abnormal   Collection Time: 11/02/19  8:28 AM  Result Value Ref Range   Sodium 136 135 - 145 mmol/L   Potassium 4.5 3.5 - 5.1 mmol/L   Chloride 102 98 - 111 mmol/L   CO2 24 22 - 32 mmol/L   Glucose, Bld 105 (H) 70 - 99 mg/dL   BUN 38 (H) 8 - 23 mg/dL   Creatinine, Ser 1.59 (H) 0.44 - 1.00 mg/dL   Calcium 8.6 (L) 8.9 - 10.3 mg/dL   GFR calc non Af Amer 31 (L) >60 mL/min   GFR calc Af Amer 36 (L) >60 mL/min   Anion gap 10 5 - 15  CBC with Differential/Platelet     Status: Abnormal   Collection Time: 11/02/19  8:28 AM  Result Value Ref Range   WBC 4.7 4.0 - 10.5 K/uL   RBC 3.30 (L) 3.87 - 5.11 MIL/uL   Hemoglobin 10.5  (L) 12.0 - 15.0 g/dL   HCT 33.8 (L) 36 - 46 %   MCV 102.4 (H) 80.0 - 100.0 fL   MCH 31.8 26.0 - 34.0 pg   MCHC 31.1 30.0 - 36.0 g/dL   RDW 14.2 11.5 - 15.5 %   Platelets 72 (L) 150 - 400 K/uL   nRBC 0.0 0.0 - 0.2 %   Neutrophils Relative % 82 %   Neutro Abs 3.9 1.7 - 7.7 K/uL   Lymphocytes Relative 11 %   Lymphs Abs 0.5 (L) 0.7 - 4.0 K/uL   Monocytes Relative 4 %   Monocytes Absolute 0.2 0 - 1 K/uL   Eosinophils Relative 1 %   Eosinophils Absolute 0.1 0 - 0 K/uL   Basophils Relative 0 %   Basophils Absolute 0.0 0 - 0 K/uL   Immature Granulocytes 2 %   Abs Immature Granulocytes 0.08 (H) 0.00 - 0.07 K/uL  Magnesium     Status: None   Collection Time: 11/02/19  8:28 AM  Result Value Ref Range   Magnesium 2.0 1.7 - 2.4 mg/dL  Glucose, capillary     Status: None   Collection Time: 11/02/19 12:23 PM  Result Value Ref Range   Glucose-Capillary 94 70 - 99 mg/dL    Recent Results (from the past 240 hour(s))  Culture, blood (routine x 2)     Status: None (Preliminary result)   Collection Time: 10/31/19  9:41 AM   Specimen: BLOOD LEFT FOREARM  Result Value Ref Range Status   Specimen Description   Final    BLOOD LEFT FOREARM Performed at [facility], [address], [place], [place] [address]    Special Requests   Final    BOTTLES DRAWN AEROBIC AND ANAEROBIC Blood Culture adequate volume Performed at [place] [facility] [place] [address], [place], [place] [address]    Culture   Final    NO GROWTH < 24 HOURS Performed at [facility] [address] [place], [place] [address]    Report Status PENDING  Incomplete  Culture, blood (routine x 2)     Status: None (Preliminary result)   Collection Time: 10/31/19 11:10 AM   Specimen: BLOOD  Result Value Ref Range Status   Specimen Description   Final    BLOOD RIGHT ANTECUBITAL Performed at [facility] [address], [place], [place] [address]    Special Requests   Final    BOTTLES  DRAWN AEROBIC  AND ANAEROBIC Blood Culture adequate volume Performed at [facility] [address], [place], [place] [address]    Culture   Final    NO GROWTH < 24 HOURS Performed at [facility] [address] [place], [place] [address]    Report Status PENDING  Incomplete  SARS Coronavirus 2 by RT PCR (hospital order, performed in [facility] hospital lab) Nasopharyngeal Nasopharyngeal Swab     Status: None   Collection Time: 10/31/19 12:08 PM   Specimen: Nasopharyngeal Swab  Result Value Ref Range Status   SARS Coronavirus 2 NEGATIVE NEGATIVE Final    Comment: (NOTE) SARS-CoV-2 target nucleic acids are NOT DETECTED.  The SARS-CoV-2 RNA is generally detectable in upper and lower respiratory specimens during the acute phase of infection. The lowest concentration of SARS-CoV-2 viral copies this assay can detect is 250 copies / mL. A negative result does not preclude SARS-CoV-2 infection and should not be used as the sole basis for treatment or other patient management decisions.  A negative result may occur with improper specimen collection / handling, submission of specimen other than nasopharyngeal swab, presence of viral mutation(s) within the areas targeted by this assay, and inadequate number of viral copies (<250 copies / mL). A negative result must be combined with clinical observations, patient history, and epidemiological information.  Fact Sheet for Patients:   StrictlyIdeas.no  Fact Sheet for Healthcare Providers: BankingDealers.co.za  This test is not yet approved or  cleared by the [place] FDA and has been authorized for detection and/or diagnosis of SARS-CoV-2 by FDA under an Emergency Use Authorization (EUA).  This EUA will remain in effect (meaning this test can be used) for the duration of the COVID-19 declaration under Section 564(b)(1) of the Act, 21 U.S.C. section 360bbb-3(b)(1),  unless the authorization is terminated or revoked sooner.  Performed at [facility], [place] [address], [place], [place] [address]   MRSA PCR Screening     Status: None   Collection Time: 11/01/19  2:47 AM   Specimen: Nasal Mucosa; Nasopharyngeal  Result Value Ref Range Status   MRSA by PCR NEGATIVE NEGATIVE Final    Comment:        The GeneXpert MRSA Assay (FDA approved for NASAL specimens only), is one component of a comprehensive MRSA colonization surveillance program. It is not intended to diagnose MRSA infection nor to guide or monitor treatment for MRSA infections. Performed at [facility], [place] [address], [place], [place] [address]      Radiology Studies: No results found. DG Chest Port 1 View  Final Result       Scheduled Meds: . clorazepate  3.75 mg Oral BID  . doxycycline  100 mg Oral Q12H  . insulin aspart  0-9 Units Subcutaneous TID AC & HS  . ipratropium-albuterol  3 mL Nebulization TID  . levothyroxine  112 mcg Oral Daily  . mouth rinse  15 mL Mouth Rinse BID  . rivaroxaban  20 mg Oral Q supper  . sodium chloride flush  10-40 mL Intracatheter Q12H  . valACYclovir  500 mg Oral Daily   PRN Meds: acetaminophen **OR** acetaminophen, bisacodyl, diphenhydrAMINE-zinc acetate, guaiFENesin-dextromethorphan, ipratropium-albuterol, ondansetron **OR** ondansetron (ZOFRAN) IV, polyethylene glycol, sodium chloride, sodium chloride flush, traZODone Continuous Infusions: . ceFEPime (MAXIPIME) IV Stopped (11/02/19 0455)      LOS: 2 days  Time spent: Greater than 50% of the 35 minute visit was spent in counseling/coordination of care for the patient as  laid out in the A&P.   Dwyane Dee, MD [facility] 11/02/2019, 12:35 PM   Contact via secure chat.  To contact the attending provider between 7A-7P or the covering provider during after hours 7P-7A, please log into the web site [email or web address] and access using universal  [place] password for that web site. If you do not have the password, please call the hospital operator.

## 2019-11-02 NOTE — Progress Notes (Signed)
Daily Progress Note   Patient Name: Erin Myers       Date: 11/02/2019 DOB: 1940/06/26  Age: 79 y.o. MRN#: [id number] Attending Physician: Erin Dee, MD Primary Care Physician: Erin Battles, MD Admit Date: 10/31/2019  Reason for Consultation/Follow-up: Establishing goals of care  Subjective: Patient with ongoing shortness of breath. Increased cough. Chest pain with cough. Spouse and daughter present at bedside for family meeting.   Length of Stay: 2  Current Medications: Scheduled Meds:  . clorazepate  3.75 mg Oral BID  . doxycycline  100 mg Oral Q12H  . insulin aspart  0-9 Units Subcutaneous TID AC & HS  . ipratropium-albuterol  3 mL Nebulization TID  . levothyroxine  112 mcg Oral Daily  . mouth rinse  15 mL Mouth Rinse BID  . rivaroxaban  20 mg Oral Q supper  . sodium chloride flush  10-40 mL Intracatheter Q12H  . valACYclovir  500 mg Oral Daily    Continuous Infusions: . ceFEPime (MAXIPIME) IV Stopped (11/02/19 0455)    PRN Meds: acetaminophen **OR** acetaminophen, bisacodyl, diphenhydrAMINE-zinc acetate, guaiFENesin-dextromethorphan, ipratropium-albuterol, ondansetron **OR** ondansetron (ZOFRAN) IV, polyethylene glycol, sodium chloride, sodium chloride flush, traZODone  Physical Exam Constitutional:      Appearance: She is ill-appearing.  Pulmonary:     Effort: Tachypnea present.  Skin:    General: Skin is warm and dry.  Neurological:     Mental Status: She is alert and oriented to person, place, and time.  Psychiatric:        Mood and Affect: Mood normal.        Behavior: Behavior normal.             Vital Signs: BP (!) 110/39   Pulse (!) 111   Temp 98.3 F (36.8 C) (Oral)   Resp 20   Ht 5\' 5"  (1.651 m)   Wt 70 kg   SpO2 (!) 88%   BMI 25.68 kg/m    SpO2: SpO2: (!) 88 % O2 Device: O2 Device: Nasal Cannula ([place]) O2 Flow Rate: O2 Flow Rate (L/min): 8 L/min  Intake/output summary:   Intake/Output Summary (Last 24 hours) at 11/02/2019 1259 Last data filed at 11/02/2019 0455 Gross per 24 hour  Intake 1340 ml  Output --  Net 1340 ml   LBM: Last BM Date: 10/30/19 Baseline Weight: Weight: 72.5 kg Most recent weight: Weight: 70 kg       Palliative Assessment/Data: PPS 40%    Flowsheet Rows     Most Recent Value  Intake Tab  Referral Department Oncology  Unit at Time of Referral Intermediate Care Unit  Palliative Care Primary Diagnosis Cancer  Date Notified 11/01/19  Palliative Care Type New Palliative care  Reason for referral Clarify Goals of Care  Date of Admission 10/31/19  Date first seen by Palliative Care 11/01/19  # of days Palliative referral response time 0 Day(s)  # of days IP prior to Palliative referral 1  Clinical Assessment  Palliative Performance Scale Score 50%  Psychosocial & Spiritual Assessment  Palliative Care Outcomes  Patient/Family meeting held? Yes  Who was at the meeting? patient and spouse  Palliative Care Outcomes Clarified goals of care  Patient Active Problem List   Diagnosis Date Noted  . Hyperkalemia 11/01/2019  . Hyponatremia 11/01/2019  . Adenocarcinoma of lung, stage 4 ([place])   . Acute respiratory failure with hypoxia ([place])   . DNR (do not resuscitate)   . Diabetes mellitus without complication ([place])   . History of pulmonary embolism   . Severe sepsis with acute organ dysfunction ([place]) 10/31/2019  . Palliative care by specialist   . AKI (acute kidney injury) ([place]) 10/02/2019  . Lobar pneumonia ([place]) 10/02/2019  . Aortic atherosclerosis ([place]) 10/02/2019  . Pneumonitis, interstitial ([place]) 09/19/2019  . Pneumonitis 07/09/2019  . Chronic hypoxemic respiratory failure ([place]) 05/30/2019  . Recurrent right pleural effusion 06/28/2018  . Antineoplastic chemotherapy induced  anemia 05/16/2018  . Bronchiectasis ([place]) 03/12/2018  . Cough 11/10/2017  . Bilateral pulmonary embolism ([place]) 10/19/2017  . Port-A-Cath in place 08/18/2017  . Encounter for antineoplastic immunotherapy 08/10/2017  . Encounter for antineoplastic chemotherapy 08/10/2017  . Adenocarcinoma of right lung, stage 4 ([place]) 07/31/2017  . Goals of care, counseling/discussion 07/31/2017  . Sinusitis 07/19/2017  . Acute on chronic respiratory failure with hypoxia ([place]) 07/19/2017  . Hypokalemia 07/19/2017  . Sepsis ([place]) 07/18/2017  . Abnormal CT of the chest 07/14/2017  . Hypoxemia 07/03/2017  . Community acquired pneumonia of right upper lobe of lung 05/30/2017  . Dyspnea 05/02/2017  . 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 History of posttraumatic stress disorder (PTSD) 05/14/2015  . Obesity (BMI 30-39.9) 08/20/2012  . Family history of coronary artery disease 08/17/2012  . Palpitations   . OSA on CPAP   . Essential hypertension   . Dyslipidemia, goal LDL below 130   . Hypothyroid     Palliative Care Assessment & Plan   HPI: 79 y.o. female  with past medical history of NSCLC on 8L oxygen baseline, T2DM, ovarian cancer, HTN, OSA on CPAP, HTN, hypothyroidism, and anxiety admitted on 10/31/2019 with pneumonia. She was recently hospitalized 7/19-7/31 for respiratory failure and pneumonia. She resumed chemo 8/21.  The patient has expressed a desire for DNR status and is considering discontinuing chemo, considering hospice. PMT referral to discuss hospice and [place].   Assessment: Patient and family tell me of patient's decline at home. She is dependent on spouse for most ADLs. She feels tired and weak most of the time. Appetite very diminished.    We discussed patient's current illness and what it means in the larger context of patient's on-going co-morbidities.  Natural disease trajectory and expectations at EOL were discussed. I attempted to elicit values and goals of care important to the  patient.  The difference between aggressive medical intervention and comfort care was considered in light of the patient's goals of care. Hospice services at home were explained in detail. Discussed hospice philosophy of care. Recommended patient consider hospice support at home with transition to focus on comfort and quality of life. All questions and concerns and patient and family addressed. Patient remains unsure how she wants to proceed and requests time to consider options.   Discussed with patient/family the importance of continued conversation with family and the medical providers regarding overall plan of care and treatment options, ensuring decisions are within the context of the patient's values and GOCs.    Patient does not feel she needs additional follow up from PMT - tells me she feels she understands the situation and her options she just needs time to consider what she wants to do and discuss with her family.  We did discuss symptom management options. Very low dose opioids for shortness of breath were offered however patient would like to hold off. She does complain nasal dryness - saline spray ordered.   Questions and concerns were addressed. The family was encouraged to call with questions or concerns.   Recommendations/Plan:  Education on hospice at home provided - patient unsure and requests time to consider - she tells me she does not feel she needs additional information from PMT and will let her attending physician know her decision, she has PMT contact information for any additional needs  Patient not interested in low dose opioids for shortness of breath at this time, saline nasal spray ordered  Code Status:  DNR  Prognosis:   poor prognosis r/t metastatic cancer, respiratory failure, decline in function and nutrition  Discharge Planning:  To Be Determined  Care plan was discussed with Dr. Sabino Gasser, patient, patient's spouse and daughter  Thank you for  allowing the Palliative Medicine Team to assist in the care of this patient.   Total Time 44 minutes Prolonged Time Billed  no       Greater than 50%  of this time was spent counseling and coordinating care related to the above assessment and plan.  Juel Burrow, DNP, [facility] Palliative Medicine Team Team Phone # [phone]  Pager [phone]

## 2019-11-02 NOTE — Progress Notes (Signed)
Report given to 4th floor nurse. Patient going to room 1401.

## 2019-11-02 NOTE — Progress Notes (Signed)
PT Cancellation Note  Patient Details Name: Erin Myers MRN: [id number] DOB: [birth year]   Cancelled Treatment:     PT order received but eval deferred at request of RN 2* SOB and desat with min exertion. RN advises pt considering home with home hospice.  Will follow.   Hassaan Crite 11/02/2019, 12:21 PM

## 2019-11-03 LAB — BASIC METABOLIC PANEL
Anion gap: 9 (ref 5–15)
BUN: 41 mg/dL — ABNORMAL HIGH (ref 8–23)
CO2: 24 mmol/L (ref 22–32)
Calcium: 8.4 mg/dL — ABNORMAL LOW (ref 8.9–10.3)
Chloride: 102 mmol/L (ref 98–111)
Creatinine, Ser: 1.81 mg/dL — ABNORMAL HIGH (ref 0.44–1.00)
GFR calc Af Amer: 31 mL/min — ABNORMAL LOW (ref >60)
GFR calc non Af Amer: 27 mL/min — ABNORMAL LOW (ref >60)
Glucose, Bld: 108 mg/dL — ABNORMAL HIGH (ref 70–99)
Potassium: 4.3 mmol/L (ref 3.5–5.1)
Sodium: 135 mmol/L (ref 135–145)

## 2019-11-03 LAB — CBC WITH DIFFERENTIAL/PLATELET
Abs Immature Granulocytes: 0.12 10*3/uL — ABNORMAL HIGH (ref 0.00–0.07)
Basophils Absolute: 0 10*3/uL (ref 0.0–0.1)
Basophils Relative: 0 %
Eosinophils Absolute: 0.1 10*3/uL (ref 0.0–0.5)
Eosinophils Relative: 1 %
HCT: 31.4 % — ABNORMAL LOW (ref 36.0–46.0)
Hemoglobin: 9.8 g/dL — ABNORMAL LOW (ref 12.0–15.0)
Immature Granulocytes: 2 %
Lymphocytes Relative: 9 %
Lymphs Abs: 0.5 10*3/uL — ABNORMAL LOW (ref 0.7–4.0)
MCH: 32 pg (ref 26.0–34.0)
MCHC: 31.2 g/dL (ref 30.0–36.0)
MCV: 102.6 fL — ABNORMAL HIGH (ref 80.0–100.0)
Monocytes Absolute: 0.3 10*3/uL (ref 0.1–1.0)
Monocytes Relative: 5 %
Neutro Abs: 4.8 10*3/uL (ref 1.7–7.7)
Neutrophils Relative %: 83 %
Platelets: 59 10*3/uL — ABNORMAL LOW (ref 150–400)
RBC: 3.06 MIL/uL — ABNORMAL LOW (ref 3.87–5.11)
RDW: 14.3 % (ref 11.5–15.5)
WBC: 5.8 10*3/uL (ref 4.0–10.5)
nRBC: 0 % (ref 0.0–0.2)

## 2019-11-03 LAB — GLUCOSE, CAPILLARY
Glucose-Capillary: 94 mg/dL (ref 70–99)
Glucose-Capillary: 92 mg/dL (ref 70–99)
Glucose-Capillary: 73 mg/dL (ref 70–99)
Glucose-Capillary: 83 mg/dL (ref 70–99)

## 2019-11-03 LAB — MAGNESIUM: Magnesium: 2.1 mg/dL (ref 1.7–2.4)

## 2019-11-03 MED ORDER — METHYLPREDNISOLONE SODIUM SUCC 125 MG IJ SOLR
125.0000 mg | Freq: Once | INTRAMUSCULAR | Status: AC
  Administered 2019-11-03: 125 mg via INTRAVENOUS
  Filled 2019-11-03: qty 2

## 2019-11-03 MED ORDER — DM-GUAIFENESIN ER 30-600 MG PO TB12
1.0000 | ORAL_TABLET | Freq: Two times a day (BID) | ORAL | Status: DC
  Administered 2019-11-03 – 2019-11-07 (×9): 1 via ORAL
  Filled 2019-11-03 (×9): qty 1

## 2019-11-03 MED ORDER — DIPHENHYDRAMINE HCL 50 MG/ML IJ SOLN
25.0000 mg | Freq: Once | INTRAMUSCULAR | Status: AC
  Administered 2019-11-03: 25 mg via INTRAVENOUS
  Filled 2019-11-03: qty 1

## 2019-11-03 MED ORDER — SODIUM CHLORIDE 0.9 % IV SOLN
2.0000 g | INTRAVENOUS | Status: AC
  Administered 2019-11-04 – 2019-11-06 (×3): 2 g via INTRAVENOUS
  Filled 2019-11-03 (×3): qty 2

## 2019-11-03 MED ORDER — MUPIROCIN 2 % EX OINT
TOPICAL_OINTMENT | CUTANEOUS | Status: AC
  Filled 2019-11-03: qty 22

## 2019-11-03 NOTE — Progress Notes (Signed)
Pharmacy Antibiotic Note  Erin Myers is a 79 y.o. female admitted on 10/31/2019 with severe sepsis secondary to pneumonia. Pharmacy has been consulted for Cefepime dosing. Patient also placed on doxycycline per MD.   Today, 11/03/19  WBC WNL  SCr 1.81, CrCl 26 mL/min. Decreased renal function  Cultures remain negative  Plan:  Decrease cefepime to 2 g IV q24h  Continue to monitor renal function   Height: 5\' 5"  (165.1 cm) Weight: 70 kg (154 lb 5.2 oz) IBW/kg (Calculated) : 57  Temp (24hrs), Avg:98.7 F (37.1 C), Min:98.4 F (36.9 C), Max:99.1 F (37.3 C)  Recent Labs  Lab 10/31/19 0941 10/31/19 1117 11/01/19 0427 11/02/19 0828 11/03/19 0547  WBC 1.9*  --  2.5* 4.7 5.8  CREATININE 2.04*  --  1.43* 1.59* 1.81*  LATICACIDVEN  --  1.0 0.7  --   --     Estimated Creatinine Clearance: 26 mL/min (A) (by C-G formula based on SCr of 1.81 mg/dL (H)).    Allergies  Allergen Reactions  . Pembrolizumab Anaphylaxis  . Chlorhexidine Dermatitis  . Adhesive [Tape] Other (See Comments)    Irritates skin   . Codeine Other (See Comments)    Skin crawlinf feeling  . Lactose Intolerance (Gi) Diarrhea  . Monosodium Glutamate Diarrhea  . Sulfa Antibiotics Nausea And Vomiting    Antimicrobials this admission: 8/19 Ceftriaxone x 1 8/19 Azithromycin x 1 8/19 Vancomycin >> 8/20 8/19 Cefepime >> 8/20 Doxycycline >> (8/24)  Dose adjustments this admission: 8/22: cefepime 2 g q12h --> 2 g q24h  Microbiology results: 8/19 BCx: ngtd 8/19 COVID: negative   Thank you for allowing pharmacy to be a part of this patient's care.  Lenis Noon, PharmD 11/03/19 10:41 AM

## 2019-11-03 NOTE — Progress Notes (Signed)
PT Cancellation Note  Patient Details Name: Erin Myers MRN: [id number] DOB: 20-Aug-1940   Cancelled Treatment:     PT eval attempted but deferred at request of pt 2* fatigue and SOB with min activity.  Will follow.  Debe Coder PT Acute Rehabilitation Services Pager [phone] Office [phone]    [facility] 11/03/2019, 12:23 PM

## 2019-11-03 NOTE — Progress Notes (Signed)
ANTICOAGULATION CONSULT NOTE - Initial Consult  Pharmacy Consult for Xarelto  Indication: history of PE  Allergies  Allergen Reactions  . Pembrolizumab Anaphylaxis  . Chlorhexidine Dermatitis  . Adhesive [Tape] Other (See Comments)    Irritates skin   . Codeine Other (See Comments)    Skin crawlinf feeling  . Lactose Intolerance (Gi) Diarrhea  . Monosodium Glutamate Diarrhea  . Sulfa Antibiotics Nausea And Vomiting    Patient Measurements: Height: 5\' 5"  (165.1 cm) Weight: 70 kg (154 lb 5.2 oz) IBW/kg (Calculated) : 57   Vital Signs: Temp: 98.2 F (36.8 C) (08/22 1232) Temp Source: Oral (08/22 1232) BP: 103/48 (08/22 1232) Pulse Rate: 100 (08/22 1232)  Labs: Recent Labs    11/01/19 0427 11/01/19 0427 11/02/19 0828 11/03/19 0547  HGB 10.9*   < > 10.5* 9.8*  HCT 34.1*  --  33.8* 31.4*  PLT 92*  --  72* 59*  LABPROT 20.4*  --   --   --   INR 1.8*  --   --   --   CREATININE 1.43*  --  1.59* 1.81*   < > = values in this interval not displayed.    Estimated Creatinine Clearance: 26 mL/min (A) (by C-G formula based on SCr of 1.81 mg/dL (H)).   Medical History: Past Medical History:  Diagnosis Date  . Anxiety   . Diabetes mellitus without complication ([place])   . Dyslipidemia   . HTN (hypertension)   . Hyperlipemia   . Hypothyroid   . Hypothyroidism   . Lung cancer ([place]) 2000   Carcinoid, s/p RLL lobectomy  . Obesity   . Ovarian cancer ([place])   . Palpitations 2009 and 2006   Negative nuclear stress test   . Sleep apnea    on C-pap     Assessment: 75 y/oF with PMH of PE (diagnosed Aug 2019) on Xarelto, stage IV non-small cell lung cancer admitted with severe sepsis 2/2 PNA as well as AKI. Pharmacy consulted to resume Xarelto.   Home dose: rivaroxaban 20 mg PO daily  Today, 11/03/19  CBC: Hgb 9.8 low but stable; Plt 59 (low, decreasing)  Pt s/p Alimta given on 10/24/19. Expect thrombocytopenia related to chemotherapy. No overt signs of bleeding -  discussed with MD.   SCr 1.81, CrCl 29 mL/min using TBW. Per updated prescribing information, avoid Xarelto use with CrCl < 20mL/min, but otherwise no dose adjustment needed for this indication.   Plan:   Continue rivaroxaban 20 mg PO daily with supper  Monitor CBC closely, monitor for signs of bleeding  Lenis Noon, PharmD 11/03/19. 3:48 PM

## 2019-11-03 NOTE — Progress Notes (Signed)
Patient has red rash on back and chest as well as her right arm, pubic area, and legs. Pt c/o some itching. Dr. Sabino Gasser made aware. New orders received and followed out. Will continue with plan of care.

## 2019-11-03 NOTE — Progress Notes (Signed)
PROGRESS NOTE    Erin Myers   IDP:[id number]  DOB: 1940-06-14  DOA: 10/31/2019     3  PCP: Erin Battles, MD  CC: SOB  Hospital Course: Ms. Erin Myers is a 79 yo CF with PMH NSCLC, adenocarcinoma (chronic 8L O2 at home), DMII, HTN, OSA on CPAP, HTN, hypothyroidism, anxiety who presented with worsening SOB and hypoxia at home.  She has been on systemic chemo previously and per oncology is s/p 23 cycles at this point. Previous oncology notes also reviewed; she has been on single agent Alimta 500 mg IV q3 weeks.  She was also recently hospitalized 09/30/2019 until 10/12/2019 for ongoing respiratory distress, pneumonia, interstitial pneumonitis.  She was treated with antibiotics, steroids, and bronchodilators.  She was weaned down to 7 to 8 L nasal cannula oxygen by discharge. Her CXR on admission was actually improved in comparison to her last CXR on 10/06/2019. She was admitted and started on vancomycin, cefepime, doxycycline.  She was also noted to have an AKI on admission as well and started on IV fluids. The morning following admission bedside discussions were held regarding her code status.  We discussed this in detail once alone and second time with her husband bedside.  She elected for a DNR status and was able to state that she would not want to have any cardiac resuscitation nor ventilator support knowing that she would have extreme difficulty coming off the ventilator and that if her heart had stopped, that it was likely the sign of overall worsening functional status. She was also evaluated by oncology and palliative care. Patient has started to consider transitioning home with hospice if no significant improvement with treatment in the hospital.    Interval History:  Admitted for worsening shortness of breath and increased oxygen demands at home.  She is admitted for treatment of recurrent pneumonia. No acute events overnight.  Resting in bed this morning with significant  congested breath sounds but seems to be clearing them okay.  Constantly spitting up into her emesis bag.  Also complaining of some nasal soreness/dryness and has been having some bloody nasal discharge due to this.  Otherwise she wishes to continue current treatment and is still giving further thought to what she wants going forward.  Old records reviewed in assessment of this patient  ROS: Constitutional: positive for fatigue and malaise, Respiratory: positive for Cough and shortness of breath, Cardiovascular: negative for chest pain and Gastrointestinal: negative for abdominal pain  Assessment & Plan: Severe sepsis with acute organ dysfunction ([place]) - organ dysfunction due to pneumonia in immunocompromised patient-tachycardic, tachypneic and leukopenic. Source of infection is pneumonia. Has AKI and mildly elevated troponin. -Broad-spectrum antibiotic with cefepime, vancomycin and doxycycline; on admission. - MRSA screen negative, will allow vanc to d/c - continue cefepime and doxy - follow up any pending cultures: remain negative  -BP remains soft; difficulty with giving much IV fluids due to respiratory status.  If initiating pressors, would also be poor prognostic indicator.  For now, will tolerate map of 60 or greater.  Continue above treatment.  Patient giving further consideration to hospice at this time  Adenocarcinoma of lung, stage 4 ([place]) - patient has had 23 cycles of chemo at this point.  She has poor functional status with ongoing decline and has been on monotherapy Alimta.  She has not been a candidate for other treatments due to her functional status.  She is now dependent on 8 L nasal cannula oxygen at home and is now  having multiple hospitalizations with worsening quality of life.  Bedside discussions held this morning with patient and her husband.  We discussed goals of care and she then elected for becoming DNR after full explanation which she was able to explain back.  She would  not want to have cardiac resuscitation or mechanical ventilation.  We discussed that this would mean DNR status but still treating reversible conditions if they would make an impact on her quality of life; she was amenable to this. -She has also been seen by oncology and is considering going home with hospice.  Palliative care following; greatly appreciate assistance.  Ongoing goals of care discussions being held  Acute respiratory failure with hypoxia ([place]) - continue O2, escalate if needed but patient now DNR - continue abx, see sepsis  AKI (acute kidney injury) ([place]) - caution with IVF but given AKI and hypoTN will try IVF intermittently as able; may make respiratory status worse (discussed this with patient as well)  Hyperkalemia - likely due to AKI; monitor and will treat as indicated  Hyponatremia - possibly multifactorial with malignancy and pre-renal - trend BMP  Essential hypertension - currently hypotensive; hold meds - see sepsis  Diabetes mellitus without complication ([place]) - follow up A1c = 5.9% - continue SSI and CBGs  History of pulmonary embolism - continue Xarelto, dosing per pharmacy  Hypothyroid - continue synthroid  OSA on CPAP - continue CPAP   Antimicrobials: Doxycycline 11/01/2019>> present Cefepime 10/31/2019>> present Rocephin 10/31/2019 x 1 Azithromycin 10/31/2019 x 1 Vancomycin 10/31/2019 x 1   DVT prophylaxis: Xarelto Code Status: DNR Family Communication: Husband Disposition Plan:  Status is: Inpatient  Remains inpatient appropriate because:Hemodynamically unstable, Unsafe d/c plan, IV treatments appropriate due to intensity of illness or inability to take PO and Inpatient level of care appropriate due to severity of illness   Dispo: The patient is from: Home              Anticipated d/c is to: Home              Anticipated d/c date is: 2 days              Patient currently is not medically stable to d/c.  Objective: Blood pressure (!)  128/48, pulse (!) 101, temperature 98.4 F (36.9 C), temperature source Oral, resp. rate 20, height 5\' 5"  (1.651 m), weight 70 kg, SpO2 96 %.  Examination: General appearance: Chronically ill-appearing elderly woman resting in bed in no distress Head: Normocephalic, without obvious abnormality, atraumatic Eyes: EOMI Lungs: Coarse breath sounds bilaterally, no obvious wheezing appreciated Heart: regular rate and rhythm and S1, S2 normal Abdomen: normal findings: bowel sounds normal and soft, non-tender Extremities: Trace lower extremity edema Skin: mobility and turgor normal Neurologic: Grossly normal  Consultants:   Oncology  Palliative care  Procedures:   None  Data Reviewed: I have personally reviewed following labs and imaging studies Results for orders placed or performed during the hospital encounter of 10/31/19 (from the past 24 hour(s))  Glucose, capillary     Status: None   Collection Time: 11/02/19 12:23 PM  Result Value Ref Range   Glucose-Capillary 94 70 - 99 mg/dL  Glucose, capillary     Status: None   Collection Time: 11/02/19  5:54 PM  Result Value Ref Range   Glucose-Capillary 85 70 - 99 mg/dL  Glucose, capillary     Status: None   Collection Time: 11/02/19 10:01 PM  Result Value Ref Range   Glucose-Capillary  88 70 - 99 mg/dL  CBC with Differential/Platelet     Status: Abnormal   Collection Time: 11/03/19  5:47 AM  Result Value Ref Range   WBC 5.8 4.0 - 10.5 K/uL   RBC 3.06 (L) 3.87 - 5.11 MIL/uL   Hemoglobin 9.8 (L) 12.0 - 15.0 g/dL   HCT 31.4 (L) 36 - 46 %   MCV 102.6 (H) 80.0 - 100.0 fL   MCH 32.0 26.0 - 34.0 pg   MCHC 31.2 30.0 - 36.0 g/dL   RDW 14.3 11.5 - 15.5 %   Platelets 59 (L) 150 - 400 K/uL   nRBC 0.0 0.0 - 0.2 %   Neutrophils Relative % 83 %   Neutro Abs 4.8 1.7 - 7.7 K/uL   Lymphocytes Relative 9 %   Lymphs Abs 0.5 (L) 0.7 - 4.0 K/uL   Monocytes Relative 5 %   Monocytes Absolute 0.3 0 - 1 K/uL   Eosinophils Relative 1 %    Eosinophils Absolute 0.1 0 - 0 K/uL   Basophils Relative 0 %   Basophils Absolute 0.0 0 - 0 K/uL   Immature Granulocytes 2 %   Abs Immature Granulocytes 0.12 (H) 0.00 - 0.07 K/uL  Basic metabolic panel     Status: Abnormal   Collection Time: 11/03/19  5:47 AM  Result Value Ref Range   Sodium 135 135 - 145 mmol/L   Potassium 4.3 3.5 - 5.1 mmol/L   Chloride 102 98 - 111 mmol/L   CO2 24 22 - 32 mmol/L   Glucose, Bld 108 (H) 70 - 99 mg/dL   BUN 41 (H) 8 - 23 mg/dL   Creatinine, Ser 1.81 (H) 0.44 - 1.00 mg/dL   Calcium 8.4 (L) 8.9 - 10.3 mg/dL   GFR calc non Af Amer 27 (L) >60 mL/min   GFR calc Af Amer 31 (L) >60 mL/min   Anion gap 9 5 - 15  Magnesium     Status: None   Collection Time: 11/03/19  5:47 AM  Result Value Ref Range   Magnesium 2.1 1.7 - 2.4 mg/dL  Glucose, capillary     Status: None   Collection Time: 11/03/19  7:28 AM  Result Value Ref Range   Glucose-Capillary 94 70 - 99 mg/dL  Glucose, capillary     Status: None   Collection Time: 11/03/19 11:23 AM  Result Value Ref Range   Glucose-Capillary 92 70 - 99 mg/dL   Comment 1 Notify RN     Recent Results (from the past 240 hour(s))  Culture, blood (routine x 2)     Status: None (Preliminary result)   Collection Time: 10/31/19  9:41 AM   Specimen: BLOOD LEFT FOREARM  Result Value Ref Range Status   Specimen Description   Final    BLOOD LEFT FOREARM Performed at [facility], [address], [place], [place] [address]    Special Requests   Final    BOTTLES DRAWN AEROBIC AND ANAEROBIC Blood Culture adequate volume Performed at [facility] [address], [place], [place] [address]    Culture   Final    NO GROWTH 3 DAYS Performed at [facility], [place] [address], [place], [place] [address]    Report Status PENDING  Incomplete  Culture, blood (routine x 2)     Status: None (Preliminary result)   Collection Time: 10/31/19 11:10 AM   Specimen: BLOOD  Result Value Ref Range Status    Specimen Description  Final    BLOOD RIGHT ANTECUBITAL Performed at [facility] [address], [place], [place] [address]    Special Requests   Final    BOTTLES DRAWN AEROBIC AND ANAEROBIC Blood Culture adequate volume Performed at [facility] [address] [place], [place] [address]    Culture   Final    NO GROWTH 3 DAYS Performed at [facility], [place] [address], [place], [place] [address]    Report Status PENDING  Incomplete  SARS Coronavirus 2 by RT PCR (hospital order, performed in [facility] hospital lab) Nasopharyngeal Nasopharyngeal Swab     Status: None   Collection Time: 10/31/19 12:08 PM   Specimen: Nasopharyngeal Swab  Result Value Ref Range Status   SARS Coronavirus 2 NEGATIVE NEGATIVE Final    Comment: (NOTE) SARS-CoV-2 target nucleic acids are NOT DETECTED.  The SARS-CoV-2 RNA is generally detectable in upper and lower respiratory specimens during the acute phase of infection. The lowest concentration of SARS-CoV-2 viral copies this assay can detect is 250 copies / mL. A negative result does not preclude SARS-CoV-2 infection and should not be used as the sole basis for treatment or other patient management decisions.  A negative result may occur with improper specimen collection / handling, submission of specimen other than nasopharyngeal swab, presence of viral mutation(s) within the areas targeted by this assay, and inadequate number of viral copies (<250 copies / mL). A negative result must be combined with clinical observations, patient history, and epidemiological information.  Fact Sheet for Patients:   StrictlyIdeas.no  Fact Sheet for Healthcare Providers: BankingDealers.co.za  This test is not yet approved or  cleared by the [place] FDA and has been authorized for detection and/or diagnosis of SARS-CoV-2 by FDA under an Emergency Use Authorization  (EUA).  This EUA will remain in effect (meaning this test can be used) for the duration of the COVID-19 declaration under Section 564(b)(1) of the Act, 21 U.S.C. section 360bbb-3(b)(1), unless the authorization is terminated or revoked sooner.  Performed at [facility], [place] [address], [place], [place] [address]   MRSA PCR Screening     Status: None   Collection Time: 11/01/19  2:47 AM   Specimen: Nasal Mucosa; Nasopharyngeal  Result Value Ref Range Status   MRSA by PCR NEGATIVE NEGATIVE Final    Comment:        The GeneXpert MRSA Assay (FDA approved for NASAL specimens only), is one component of a comprehensive MRSA colonization surveillance program. It is not intended to diagnose MRSA infection nor to guide or monitor treatment for MRSA infections. Performed at [facility], [place] [address], [place], [place] [address]      Radiology Studies: No results found. DG Chest Port 1 View  Final Result       Scheduled Meds:  clorazepate  3.75 mg Oral BID   dextromethorphan-guaiFENesin  1 tablet Oral BID   doxycycline  100 mg Oral Q12H   insulin aspart  0-9 Units Subcutaneous TID AC & HS   ipratropium-albuterol  3 mL Nebulization TID   levothyroxine  112 mcg Oral Daily   mouth rinse  15 mL Mouth Rinse BID   mupirocin ointment       rivaroxaban  20 mg Oral Q supper   sodium chloride flush  10-40 mL Intracatheter Q12H   valACYclovir  500 mg Oral Daily   PRN Meds: acetaminophen **OR** acetaminophen, bisacodyl, diphenhydrAMINE-zinc acetate, guaiFENesin-dextromethorphan, ipratropium-albuterol, ondansetron **OR**  ondansetron (ZOFRAN) IV, polyethylene glycol, sodium chloride, sodium chloride flush, traZODone Continuous Infusions:  [START ON 11/04/2019] ceFEPime (MAXIPIME) IV        LOS: 3 days  Time spent: Greater than 50% of the 35 minute visit was spent in counseling/coordination of care for the patient as laid out in the  A&P.   Dwyane Dee, MD [facility] 11/03/2019, 11:51 AM   Contact via secure chat.  To contact the attending provider between 7A-7P or the covering provider during after hours 7P-7A, please log into the web site [email or web address] and access using universal [place] password for that web site. If you do not have the password, please call the hospital operator.

## 2019-11-04 DIAGNOSIS — R21 Rash and other nonspecific skin eruption: Secondary | ICD-10-CM

## 2019-11-04 DIAGNOSIS — Z86711 Personal history of pulmonary embolism: Secondary | ICD-10-CM

## 2019-11-04 LAB — BASIC METABOLIC PANEL
Anion gap: 11 (ref 5–15)
BUN: 47 mg/dL — ABNORMAL HIGH (ref 8–23)
CO2: 23 mmol/L (ref 22–32)
Calcium: 8.9 mg/dL (ref 8.9–10.3)
Chloride: 102 mmol/L (ref 98–111)
Creatinine, Ser: 2.15 mg/dL — ABNORMAL HIGH (ref 0.44–1.00)
GFR calc Af Amer: 25 mL/min — ABNORMAL LOW (ref >60)
GFR calc non Af Amer: 22 mL/min — ABNORMAL LOW (ref >60)
Glucose, Bld: 189 mg/dL — ABNORMAL HIGH (ref 70–99)
Potassium: 5.1 mmol/L (ref 3.5–5.1)
Sodium: 136 mmol/L (ref 135–145)

## 2019-11-04 LAB — CBC WITH DIFFERENTIAL/PLATELET
Abs Immature Granulocytes: 0.09 10*3/uL — ABNORMAL HIGH (ref 0.00–0.07)
Basophils Absolute: 0 10*3/uL (ref 0.0–0.1)
Basophils Relative: 0 %
Eosinophils Absolute: 0 10*3/uL (ref 0.0–0.5)
Eosinophils Relative: 0 %
HCT: 31.4 % — ABNORMAL LOW (ref 36.0–46.0)
Hemoglobin: 9.6 g/dL — ABNORMAL LOW (ref 12.0–15.0)
Immature Granulocytes: 2 %
Lymphocytes Relative: 3 %
Lymphs Abs: 0.2 10*3/uL — ABNORMAL LOW (ref 0.7–4.0)
MCH: 31.6 pg (ref 26.0–34.0)
MCHC: 30.6 g/dL (ref 30.0–36.0)
MCV: 103.3 fL — ABNORMAL HIGH (ref 80.0–100.0)
Monocytes Absolute: 0.1 10*3/uL (ref 0.1–1.0)
Monocytes Relative: 1 %
Neutro Abs: 4.5 10*3/uL (ref 1.7–7.7)
Neutrophils Relative %: 94 %
Platelets: 49 10*3/uL — ABNORMAL LOW (ref 150–400)
RBC: 3.04 MIL/uL — ABNORMAL LOW (ref 3.87–5.11)
RDW: 14.1 % (ref 11.5–15.5)
WBC: 4.9 10*3/uL (ref 4.0–10.5)
nRBC: 0 % (ref 0.0–0.2)

## 2019-11-04 LAB — GLUCOSE, CAPILLARY
Glucose-Capillary: 119 mg/dL — ABNORMAL HIGH (ref 70–99)
Glucose-Capillary: 196 mg/dL — ABNORMAL HIGH (ref 70–99)
Glucose-Capillary: 132 mg/dL — ABNORMAL HIGH (ref 70–99)
Glucose-Capillary: 134 mg/dL — ABNORMAL HIGH (ref 70–99)

## 2019-11-04 LAB — MAGNESIUM: Magnesium: 2 mg/dL (ref 1.7–2.4)

## 2019-11-04 MED ORDER — SODIUM CHLORIDE 0.9 % IV SOLN: INTRAVENOUS | Status: DC

## 2019-11-04 MED ORDER — HEPARIN SODIUM (PORCINE) 5000 UNIT/ML IJ SOLN
5000.0000 [IU] | Freq: Three times a day (TID) | INTRAMUSCULAR | Status: DC
  Administered 2019-11-04 – 2019-11-05 (×2): 5000 [IU] via SUBCUTANEOUS
  Filled 2019-11-04 (×2): qty 1

## 2019-11-04 NOTE — Progress Notes (Signed)
OT Cancellation Note  Patient Details Name: Erin Myers MRN: [id number] DOB: 02/07/1941   Cancelled Treatment:    Reason Eval/Treat Not Completed: Patient declined, no reason specified. Patient reports has upset stomach, states she has still be getting up to the chair and bedside commode with nursing but declined OOB x2 this AM with OT. Will continue to follow.  Delbert Phenix OT OT pager: [place] 11/04/2019, 10:59 AM

## 2019-11-04 NOTE — Progress Notes (Signed)
PT Cancellation Note  Patient Details Name: Erin Myers MRN: [id number] DOB: February 20, 1941   Cancelled Treatment:    Reason Eval/Treat Not Completed: PT screened, no needs identified, will sign off. Spoke with pt who politely declines acute PT services. Multiple attempts to work with pt this admission but she has not wished to work with PT. Encouraged pt to ask MD to reorder PT if her needs/wishes change. Thanks.    [facility]  Office: [phone] Pager: [phone]

## 2019-11-04 NOTE — Progress Notes (Signed)
Pt has had no urine output throughout  PM shift. Bladder scan performed with 163 ml. Pt denies discomfort or urgency. Night MD made aware. No new orders. Will perform another bladder scan before end of shift.

## 2019-11-04 NOTE — Care Management Important Message (Signed)
Important Message  Patient Details IM Letter given to the Patient Name: Erin Myers MRN: [id number] Date of Birth: 11-04-1940   Medicare Important Message Given:  Yes     Kerin Salen 11/04/2019, 11:10 AM

## 2019-11-04 NOTE — Progress Notes (Signed)
PROGRESS NOTE    Erin Myers   JQB:[id number]  DOB: 1940-07-11  DOA: 10/31/2019     4  PCP: [name], MD  CC: SOB  Hospital Course: [name] is a 79 yo CF with PMH NSCLC, adenocarcinoma (chronic 8L O2 at home), DMII, HTN, OSA on CPAP, HTN, hypothyroidism, anxiety who presented with worsening SOB and hypoxia at home.  She has been on systemic chemo previously and per oncology is s/p 23 cycles at this point. Previous oncology notes also reviewed; she has been on single agent Alimta 500 mg IV q3 weeks.  She was also recently hospitalized 09/30/2019 until 10/12/2019 for ongoing respiratory distress, pneumonia, interstitial pneumonitis.  She was treated with antibiotics, steroids, and bronchodilators.  She was weaned down to 7 to 8 L nasal cannula oxygen by discharge. Her CXR on admission was actually improved in comparison to her last CXR on 10/06/2019. She was admitted and started on vancomycin, cefepime, doxycycline.  She was also noted to have an AKI on admission as well and started on IV fluids. The morning following admission bedside discussions were held regarding her code status.  We discussed this in detail once alone and second time with her husband bedside.  She elected for a DNR status and was able to state that she would not want to have any cardiac resuscitation nor ventilator support knowing that she would have extreme difficulty coming off the ventilator and that if her heart had stopped, that it was likely the sign of overall worsening functional status. She was also evaluated by oncology and palliative care. Patient has started to consider transitioning home with hospice if no significant improvement with treatment in the hospital.    Interval History:  Admitted for worsening shortness of breath and increased oxygen demands at home.  She is admitted for treatment of recurrent pneumonia.  Resting in bed comfortably this morning.  A friend is bedside.  Informed  her that her renal function has worsened some and after discussion, she would like to start on some fluids as well.  She also plans to talk with her oncologist tomorrow to help decide further plans of transitioning to hospice or continuing on treatment. Informed her to tell nursing if respiratory status gets worse after starting on fluids. She also understands Xarelto is on hold for now due to platelet count.  Old records reviewed in assessment of this patient  ROS: Constitutional: positive for fatigue and malaise, Respiratory: positive for Cough and shortness of breath, Cardiovascular: negative for chest pain and Gastrointestinal: negative for abdominal pain  Assessment & Plan: Severe sepsis with acute organ dysfunction ([place]) - organ dysfunction due to pneumonia in immunocompromised patient-tachycardic, tachypneic and leukopenic. Source of infection is pneumonia. Has AKI and mildly elevated troponin. -Broad-spectrum antibiotic with cefepime, vancomycin and doxycycline; on admission. - MRSA screen negative, will allow vanc to d/c - continue cefepime and doxy - follow up any pending cultures: remain negative  -BP remains soft; difficulty with giving much IV fluids due to respiratory status.  If initiating pressors, would also be poor prognostic indicator.  For now, will tolerate map of 60 or greater.  Continue above treatment.  Patient giving further consideration to hospice at this time  Adenocarcinoma of lung, stage 4 ([place]) - patient has had 23 cycles of chemo at this point.  She has poor functional status with ongoing decline and has been on monotherapy Alimta.  She has not been a candidate for other treatments due to her functional  status.  She is now dependent on 8 L nasal cannula oxygen at home and is now having multiple hospitalizations with worsening quality of life.  Bedside discussions held this morning with patient and her husband.  We discussed goals of care and she then elected for  becoming DNR after full explanation which she was able to explain back.  She would not want to have cardiac resuscitation or mechanical ventilation.  We discussed that this would mean DNR status but still treating reversible conditions if they would make an impact on her quality of life; she was amenable to this. -She has also been seen by oncology and is considering going home with hospice.  Palliative care following; greatly appreciate assistance.  Ongoing goals of care discussions being held  Acute respiratory failure with hypoxia ([place]) - continue O2, escalate if needed but patient now DNR - continue abx, see sepsis  AKI (acute kidney injury) ([place]) - caution with IVF but given AKI and hypoTN will try IVF intermittently as able; may make respiratory status worse (discussed this with patient as well) -On 11/04/2019, discussed her worsening renal status likely in the setting of her not taking in much nutrition or liquids.  We also discussed the possibility of worsening her respiratory status if starting fluids.  We had been holding off on fluids for a couple days but now she is wanting to try fluids again so as to prevent further worsening of renal function.  We will try low rate fluids for 24 hours, if respiratory status worsens, will need to back off once again. -Repeat BMP in a.m.  Hyperkalemia - likely due to AKI; monitor and will treat as indicated  Hyponatremia - possibly multifactorial with malignancy and pre-renal - trend BMP  Essential hypertension - currently hypotensive; hold meds - see sepsis  Diabetes mellitus without complication ([place]) - follow up A1c = 5.9% - continue SSI and CBGs  History of pulmonary embolism - continue Xarelto, dosing per pharmacy - PLTC <50k on 8/23; hold Xarelto for now and use HSQ; will resume when/if able  Hypothyroid - continue synthroid  OSA on CPAP - continue CPAP  Rash -Appears to have developed a possible drug rash on 11/03/2019.  She  has been on cefepime and doxycycline.  Unclear at this time which one is associated with rash -No associated shortness of breath, stridor, or difficulty breathing -Was given IV Benadryl and Solu-Medrol on 11/03/2019.  Continue Benadryl cream -Doxycycline discontinued for now.  Continue cefepime and if rash recurs again today, will further modify antibiotic regimen   Antimicrobials: Doxycycline 11/01/2019>> 11/03/19 Cefepime 10/31/2019>> present Rocephin 10/31/2019 x 1 Azithromycin 10/31/2019 x 1 Vancomycin 10/31/2019 x 1   DVT prophylaxis: Xarelto (on hold as of 8/23 due to PLTC<50k) Code Status: DNR Family Communication: Husband Disposition Plan:  Status is: Inpatient  Remains inpatient appropriate because:Hemodynamically unstable, Unsafe d/c plan, IV treatments appropriate due to intensity of illness or inability to take PO and Inpatient level of care appropriate due to severity of illness   Dispo: The patient is from: Home              Anticipated d/c is to: Home              Anticipated d/c date is: 2 days              Patient currently is not medically stable to d/c.  Objective: Blood pressure (!) 113/42, pulse 93, temperature (!) 97.3 F (36.3 C), temperature source Oral, resp.  rate 20, height 5\' 5"  (1.651 m), weight 70 kg, SpO2 98 %.  Examination: General appearance: Chronically ill-appearing elderly woman resting in bed in no distress Head: Normocephalic, without obvious abnormality, atraumatic Eyes: EOMI Lungs: Coarse breath sounds bilaterally, no obvious wheezing appreciated Heart: regular rate and rhythm and S1, S2 normal Abdomen: normal findings: bowel sounds normal and soft, non-tender Extremities: Trace lower extremity edema Skin: mobility and turgor normal Neurologic: Grossly normal  Consultants:   Oncology  Palliative care  Procedures:   None  Data Reviewed: I have personally reviewed following labs and imaging studies Results for orders placed or  performed during the hospital encounter of 10/31/19 (from the past 24 hour(s))  Glucose, capillary     Status: None   Collection Time: 11/03/19  4:07 PM  Result Value Ref Range   Glucose-Capillary 73 70 - 99 mg/dL   Comment 1 Notify RN   Glucose, capillary     Status: None   Collection Time: 11/03/19  7:47 PM  Result Value Ref Range   Glucose-Capillary 83 70 - 99 mg/dL  CBC with Differential/Platelet     Status: Abnormal   Collection Time: 11/04/19  2:39 AM  Result Value Ref Range   WBC 4.9 4.0 - 10.5 K/uL   RBC 3.04 (L) 3.87 - 5.11 MIL/uL   Hemoglobin 9.6 (L) 12.0 - 15.0 g/dL   HCT 31.4 (L) 36 - 46 %   MCV 103.3 (H) 80.0 - 100.0 fL   MCH 31.6 26.0 - 34.0 pg   MCHC 30.6 30.0 - 36.0 g/dL   RDW 14.1 11.5 - 15.5 %   Platelets 49 (L) 150 - 400 K/uL   nRBC 0.0 0.0 - 0.2 %   Neutrophils Relative % 94 %   Neutro Abs 4.5 1.7 - 7.7 K/uL   Lymphocytes Relative 3 %   Lymphs Abs 0.2 (L) 0.7 - 4.0 K/uL   Monocytes Relative 1 %   Monocytes Absolute 0.1 0 - 1 K/uL   Eosinophils Relative 0 %   Eosinophils Absolute 0.0 0 - 0 K/uL   Basophils Relative 0 %   Basophils Absolute 0.0 0 - 0 K/uL   Immature Granulocytes 2 %   Abs Immature Granulocytes 0.09 (H) 0.00 - 0.07 K/uL  Basic metabolic panel     Status: Abnormal   Collection Time: 11/04/19  2:39 AM  Result Value Ref Range   Sodium 136 135 - 145 mmol/L   Potassium 5.1 3.5 - 5.1 mmol/L   Chloride 102 98 - 111 mmol/L   CO2 23 22 - 32 mmol/L   Glucose, Bld 189 (H) 70 - 99 mg/dL   BUN 47 (H) 8 - 23 mg/dL   Creatinine, Ser 2.15 (H) 0.44 - 1.00 mg/dL   Calcium 8.9 8.9 - 10.3 mg/dL   GFR calc non Af Amer 22 (L) >60 mL/min   GFR calc Af Amer 25 (L) >60 mL/min   Anion gap 11 5 - 15  Magnesium     Status: None   Collection Time: 11/04/19  2:39 AM  Result Value Ref Range   Magnesium 2.0 1.7 - 2.4 mg/dL  Glucose, capillary     Status: Abnormal   Collection Time: 11/04/19  7:37 AM  Result Value Ref Range   Glucose-Capillary 119 (H) 70 - 99  mg/dL  Glucose, capillary     Status: Abnormal   Collection Time: 11/04/19 11:48 AM  Result Value Ref Range   Glucose-Capillary 196 (H) 70 - 99 mg/dL  Recent Results (from the past 240 hour(s))  Culture, blood (routine x 2)     Status: None (Preliminary result)   Collection Time: 10/31/19  9:41 AM   Specimen: BLOOD LEFT FOREARM  Result Value Ref Range Status   Specimen Description   Final    BLOOD LEFT FOREARM Performed at [facility], [address], [place], [place] [address]    Special Requests   Final    BOTTLES DRAWN AEROBIC AND ANAEROBIC Blood Culture adequate volume Performed at [facility] [address], [place], [place] [address]    Culture   Final    NO GROWTH 4 DAYS Performed at [facility], [place] [address] [place], [place] [address]    Report Status PENDING  Incomplete  Culture, blood (routine x 2)     Status: None (Preliminary result)   Collection Time: 10/31/19 11:10 AM   Specimen: BLOOD  Result Value Ref Range Status   Specimen Description   Final    BLOOD RIGHT ANTECUBITAL Performed at [facility] [address] [place], [place] [address]    Special Requests   Final    BOTTLES DRAWN AEROBIC AND ANAEROBIC Blood Culture adequate volume Performed at [facility] [address] [place], [place] [address]    Culture   Final    NO GROWTH 4 DAYS Performed at [facility], [place] [address] [place], [place] [address]    Report Status PENDING  Incomplete  SARS Coronavirus 2 by RT PCR (hospital order, performed in [facility] hospital lab) Nasopharyngeal Nasopharyngeal Swab     Status: None   Collection Time: 10/31/19 12:08 PM   Specimen: Nasopharyngeal Swab  Result Value Ref Range Status   SARS Coronavirus 2 NEGATIVE NEGATIVE Final    Comment: (NOTE) SARS-CoV-2 target nucleic acids are NOT DETECTED.  The SARS-CoV-2 RNA is generally detectable in upper and lower respiratory  specimens during the acute phase of infection. The lowest concentration of SARS-CoV-2 viral copies this assay can detect is 250 copies / mL. A negative result does not preclude SARS-CoV-2 infection and should not be used as the sole basis for treatment or other patient management decisions.  A negative result may occur with improper specimen collection / handling, submission of specimen other than nasopharyngeal swab, presence of viral mutation(s) within the areas targeted by this assay, and inadequate number of viral copies (<250 copies / mL). A negative result must be combined with clinical observations, patient history, and epidemiological information.  Fact Sheet for Patients:   StrictlyIdeas.no  Fact Sheet for Healthcare Providers: BankingDealers.co.za  This test is not yet approved or  cleared by the [place] FDA and has been authorized for detection and/or diagnosis of SARS-CoV-2 by FDA under an Emergency Use Authorization (EUA).  This EUA will remain in effect (meaning this test can be used) for the duration of the COVID-19 declaration under Section 564(b)(1) of the Act, 21 U.S.C. section 360bbb-3(b)(1), unless the authorization is terminated or revoked sooner.  Performed at [facility], [place] [address] [place], [place] [address]   MRSA PCR Screening     Status: None   Collection Time: 11/01/19  2:47 AM   Specimen: Nasal Mucosa; Nasopharyngeal  Result Value Ref Range Status   MRSA by PCR NEGATIVE NEGATIVE Final    Comment:        The GeneXpert MRSA Assay (FDA approved for NASAL specimens only), is one component of  a comprehensive MRSA colonization surveillance program. It is not intended to diagnose MRSA infection nor to guide or monitor treatment for MRSA infections. Performed at [facility], [place] [address], [place], [place] [address]      Radiology Studies: No results  found. DG Chest Port 1 View  Final Result       Scheduled Meds: . clorazepate  3.75 mg Oral BID  . dextromethorphan-guaiFENesin  1 tablet Oral BID  . heparin injection (subcutaneous)  5,000 Units Subcutaneous Q8H  . insulin aspart  0-9 Units Subcutaneous TID AC & HS  . ipratropium-albuterol  3 mL Nebulization TID  . levothyroxine  112 mcg Oral Daily  . mouth rinse  15 mL Mouth Rinse BID  . sodium chloride flush  10-40 mL Intracatheter Q12H  . valACYclovir  500 mg Oral Daily   PRN Meds: acetaminophen **OR** acetaminophen, bisacodyl, diphenhydrAMINE-zinc acetate, guaiFENesin-dextromethorphan, ipratropium-albuterol, ondansetron **OR** ondansetron (ZOFRAN) IV, polyethylene glycol, sodium chloride, sodium chloride flush, traZODone Continuous Infusions: . sodium chloride    . ceFEPime (MAXIPIME) IV 2 g (11/04/19 0832)      LOS: 4 days  Time spent: Greater than 50% of the 35 minute visit was spent in counseling/coordination of care for the patient as laid out in the A&P.   Dwyane Dee, MD [facility] 11/04/2019, 2:50 PM   Contact via secure chat.  To contact the attending provider between 7A-7P or the covering provider during after hours 7P-7A, please log into the web site [email or web address] and access using universal [place] password for that web site. If you do not have the password, please call the hospital operator.

## 2019-11-04 NOTE — Assessment & Plan Note (Signed)
-  Appears to have developed a possible drug rash on 11/03/2019.  She has been on cefepime and doxycycline.  Unclear at this time which one is associated with rash -No associated shortness of breath, stridor, or difficulty breathing -Was given IV Benadryl and Solu-Medrol on 11/03/2019.  Continue Benadryl cream -Doxycycline discontinued for now.  Continue cefepime and if rash recurs again today, will further modify antibiotic regimen

## 2019-11-05 LAB — CBC WITH DIFFERENTIAL/PLATELET
Abs Immature Granulocytes: 0.22 10*3/uL — ABNORMAL HIGH (ref 0.00–0.07)
Basophils Absolute: 0 10*3/uL (ref 0.0–0.1)
Basophils Relative: 0 %
Eosinophils Absolute: 0 10*3/uL (ref 0.0–0.5)
Eosinophils Relative: 0 %
HCT: 28.4 % — ABNORMAL LOW (ref 36.0–46.0)
Hemoglobin: 8.9 g/dL — ABNORMAL LOW (ref 12.0–15.0)
Immature Granulocytes: 3 %
Lymphocytes Relative: 10 %
Lymphs Abs: 0.7 10*3/uL (ref 0.7–4.0)
MCH: 31.7 pg (ref 26.0–34.0)
MCHC: 31.3 g/dL (ref 30.0–36.0)
MCV: 101.1 fL — ABNORMAL HIGH (ref 80.0–100.0)
Monocytes Absolute: 0.3 10*3/uL (ref 0.1–1.0)
Monocytes Relative: 4 %
Neutro Abs: 6.4 10*3/uL (ref 1.7–7.7)
Neutrophils Relative %: 83 %
Platelets: 59 10*3/uL — ABNORMAL LOW (ref 150–400)
RBC: 2.81 MIL/uL — ABNORMAL LOW (ref 3.87–5.11)
RDW: 13.9 % (ref 11.5–15.5)
WBC: 7.7 10*3/uL (ref 4.0–10.5)
nRBC: 0 % (ref 0.0–0.2)

## 2019-11-05 LAB — BASIC METABOLIC PANEL
Anion gap: 8 (ref 5–15)
BUN: 56 mg/dL — ABNORMAL HIGH (ref 8–23)
CO2: 23 mmol/L (ref 22–32)
Calcium: 8.5 mg/dL — ABNORMAL LOW (ref 8.9–10.3)
Chloride: 103 mmol/L (ref 98–111)
Creatinine, Ser: 1.68 mg/dL — ABNORMAL HIGH (ref 0.44–1.00)
GFR calc Af Amer: 34 mL/min — ABNORMAL LOW (ref >60)
GFR calc non Af Amer: 29 mL/min — ABNORMAL LOW (ref >60)
Glucose, Bld: 117 mg/dL — ABNORMAL HIGH (ref 70–99)
Potassium: 4.7 mmol/L (ref 3.5–5.1)
Sodium: 134 mmol/L — ABNORMAL LOW (ref 135–145)

## 2019-11-05 LAB — GLUCOSE, CAPILLARY
Glucose-Capillary: 99 mg/dL (ref 70–99)
Glucose-Capillary: 111 mg/dL — ABNORMAL HIGH (ref 70–99)
Glucose-Capillary: 81 mg/dL (ref 70–99)
Glucose-Capillary: 124 mg/dL — ABNORMAL HIGH (ref 70–99)

## 2019-11-05 LAB — CULTURE, BLOOD (ROUTINE X 2)
Culture: NO GROWTH
Culture: NO GROWTH
Special Requests: ADEQUATE
Special Requests: ADEQUATE

## 2019-11-05 LAB — MAGNESIUM: Magnesium: 2.1 mg/dL (ref 1.7–2.4)

## 2019-11-05 MED ORDER — RIVAROXABAN 20 MG PO TABS
20.0000 mg | ORAL_TABLET | Freq: Every day | ORAL | Status: DC
  Administered 2019-11-05: 20 mg via ORAL
  Filled 2019-11-05: qty 1

## 2019-11-05 NOTE — Progress Notes (Signed)
PROGRESS NOTE    Erin Myers  YJE:[id number] DOB: [birth year] DOA: 10/31/2019 PCP: [name], MD   Brief Narrative:   Patient  is a 79 yo CF with PMH NSCLC, adenocarcinoma (chronic 8L O2 at home), DMII, HTN, OSA on CPAP, HTN, hypothyroidism, anxiety who presented with worsening SOB and hypoxia at home.  She has been on systemic chemo previously and per oncology is s/p 23 cycles at this point. Previous oncology notes also reviewed; she has been on single agent Alimta 500 mg IV q3 weeks.  She was also recently hospitalized 09/30/2019 until 10/12/2019 for ongoing respiratory distress, pneumonia, interstitial pneumonitis.  She was treated with antibiotics, steroids, and bronchodilators.  She was weaned down to 7 to 8 L nasal cannula oxygen by discharge. Her CXR on admission was actually improved in comparison to her last CXR on 10/06/2019. She was admitted and started on vancomycin, cefepime, doxycycline.  She was also noted to have an AKI on admission as well and started on IV fluids. Due to her worsening overall status, advanced malignancy, goals of care were discussed and her CODE STATUS was changed to DNR.  She was also evaluated by oncology and palliative care. Patient is now interested on hospice  at home.  She does not want to continue chemotherapy .  Oncology to see her later today.  Assessment & Plan:   Active Problems:   OSA on CPAP   Essential hypertension   Hypothyroid   AKI (acute kidney injury) (HCC)   Severe sepsis with acute organ dysfunction (HCC)   Adenocarcinoma of lung, stage 4 (HCC)   Acute respiratory failure with hypoxia (HCC)   DNR (do not resuscitate)   Hyperkalemia   Hyponatremia   Diabetes mellitus without complication ([place])   History of pulmonary embolism   Rash   Severe sepsis with acute organ dysfunction ([place]) - organ dysfunction due to pneumonia in immunocompromised patient-on presentation she was tachycardic,  tachypneicandleukopenic.Source of infection is pneumonia. Has AKI and mildly elevated troponin. -Broad-spectrum antibiotic with cefepime, vancomycin and doxycycline; on admission. - vancomycin D/ced - On cefepime ,day 6/7 - follow up any pending cultures: remain negative  -BP is stable now  Adenocarcinoma of lung, stage 4 (HCC) - patient has had 23 cycles of chemo at this point.  She has poor functional status with ongoing decline and has been on monotherapy Alimta.  She has not been a candidate for other treatments due to her functional status.  She is now dependent on 8 L nasal cannula oxygen at home and is now having multiple hospitalizations with worsening quality of life.  Bedside discussions held this morning with patient and her husband.  We discussed goals of care and she then elected for becoming DNR . Patient is now interested on hospice  at home.  She does not want to continue chemotherapy .  Oncology to see her later today.   Acute respiratory failure with hypoxia (HCC) - continue O2, escalate if needed but patient now DNR  AKI (acute kidney injury) ([place]) - Improved with IV fluids -Repeat BMP in a.m.  Hyperkalemia - Resolved  Essential hypertension -Currently stable.  Antihypertensives on hold.  Diabetes mellitus without complication ([place]) - follow up A1c = 5.9% - continue SSI and monitor CBGs  History of pulmonary embolism - continue Xarelto -Has thrombocytopenia but improved to 59 today.  So Xarelto resumed.  Hypothyroid - continue synthroid  OSA on CPAP - continue CPAP  Rash -Appears to have developed a possible drug  rash on 11/03/2019.  She has been on cefepime and doxycycline.  Unclear at this time which one is associated with rash -No associated shortness of breath, stridor, or difficulty breathing -Was given IV Benadryl and Solu-Medrol on 11/03/2019.  Continue Benadryl cream -Doxycycline discontinued     Debility/deconditioning -PT  consulted.         DVT prophylaxis:Xarelto Code Status: DNR Family Communication:daughter at bed side  Status is: Inpatient  Remains inpatient appropriate because:Hemodynamically unstable   Dispo: The patient is from: Home              Anticipated d/c is to: Home              Anticipated d/c date is: 2 days              Patient currently is not medically stable to d/c.  Ongoing discussion about goals of care.  Hospice at home is a possibility.  Consultants: Oncology, palliative care  Procedures: None  Antimicrobials:  Anti-infectives (From admission, onward)   Start     Dose/Rate Route Frequency Ordered Stop   11/04/19 0600  ceFEPIme (MAXIPIME) 2 g in sodium chloride 0.9 % 100 mL IVPB        2 g 200 mL/hr over 30 Minutes Intravenous Every 24 hours 11/03/19 1041 11/07/19 0559   11/01/19 2000  valACYclovir (VALTREX) tablet 500 mg        500 mg Oral Daily 11/01/19 1826     11/01/19 1800  ceFEPIme (MAXIPIME) 2 g in sodium chloride 0.9 % 100 mL IVPB  Status:  Discontinued        2 g 200 mL/hr over 30 Minutes Intravenous Every 24 hours 10/31/19 1427 11/01/19 1231   11/01/19 1600  vancomycin (VANCOREADY) IVPB 750 mg/150 mL  Status:  Discontinued        750 mg 150 mL/hr over 60 Minutes Intravenous Every 24 hours 10/31/19 1423 11/01/19 1303   11/01/19 1600  ceFEPIme (MAXIPIME) 2 g in sodium chloride 0.9 % 100 mL IVPB  Status:  Discontinued        2 g 200 mL/hr over 30 Minutes Intravenous Every 12 hours 11/01/19 1231 11/03/19 1041   11/01/19 1000  doxycycline (VIBRA-TABS) tablet 100 mg  Status:  Discontinued        100 mg Oral Every 12 hours 10/31/19 1402 11/03/19 1835   10/31/19 1430  ceFEPIme (MAXIPIME) 2 g in sodium chloride 0.9 % 100 mL IVPB        2 g 200 mL/hr over 30 Minutes Intravenous STAT 10/31/19 1402 10/31/19 1912   10/31/19 1415  vancomycin (VANCOCIN) IVPB 1000 mg/200 mL premix  Status:  Discontinued        1,000 mg 200 mL/hr over 60 Minutes Intravenous  Once  10/31/19 1402 10/31/19 1414   10/31/19 1415  vancomycin (VANCOREADY) IVPB 1500 mg/300 mL        1,500 mg 150 mL/hr over 120 Minutes Intravenous  Once 10/31/19 1414 10/31/19 1832   10/31/19 1030  cefTRIAXone (ROCEPHIN) 1 g in sodium chloride 0.9 % 100 mL IVPB  Status:  Discontinued        1 g 200 mL/hr over 30 Minutes Intravenous  Once 10/31/19 1020 10/31/19 1025   10/31/19 1030  azithromycin (ZITHROMAX) 500 mg in sodium chloride 0.9 % 250 mL IVPB        500 mg 250 mL/hr over 60 Minutes Intravenous  Once 10/31/19 1020 10/31/19 1312   10/31/19 1030  cefTRIAXone (ROCEPHIN)  2 g in sodium chloride 0.9 % 100 mL IVPB  Status:  Discontinued        2 g 200 mL/hr over 30 Minutes Intravenous Every 24 hours 10/31/19 1024 10/31/19 1402      Subjective:  Patient seen and examined at bedside this morning.  Currently hemodynamically stable. She is  still on 8 L of oxygen per minute.  Has some cough.  Lying in bed chair.  Ate her lunch today.  She had decided not to continue chemotherapy and is interested on hospice at home  Objective: Vitals:   11/04/19 2040 11/04/19 2115 11/05/19 0520 11/05/19 0802  BP:  (!) 118/45 (!) 122/49   Pulse:  88 82   Resp:  18 17   Temp:  97.7 F (36.5 C) 98 F (36.7 C)   TempSrc:  Oral    SpO2: 93% 97% 97% 96%  Weight:      Height:        Intake/Output Summary (Last 24 hours) at 11/05/2019 1229 Last data filed at 11/05/2019 0900 Gross per 24 hour  Intake 2528.32 ml  Output --  Net 2528.32 ml   Filed Weights   10/31/19 0941 11/01/19 0241  Weight: 72.5 kg 70 kg    Examination:  General exam: Chronically looking, deconditioned Respiratory system: Bilateral rhonchi, diminished air sounds  cardiovascular system: S1 & S2 heard, RRR. No JVD, murmurs, rubs, gallops or clicks. No pedal edema. Gastrointestinal system: Abdomen is nondistended, soft and nontender. No organomegaly or masses felt. Normal bowel sounds heard. Central nervous system: Alert and oriented.  No focal neurological deficits. Extremities: No edema, no clubbing ,no cyanosis Skin: No rashes, lesions or ulcers,no icterus ,no pallor   Data Reviewed: I have personally reviewed following labs and imaging studies  CBC: Recent Labs  Lab 10/31/19 0941 10/31/19 0941 11/01/19 0427 11/02/19 0828 11/03/19 0547 11/04/19 0239 11/05/19 0359  WBC 1.9*   < > 2.5* 4.7 5.8 4.9 7.7  NEUTROABS 1.4*  --   --  3.9 4.8 4.5 6.4  HGB 13.0   < > 10.9* 10.5* 9.8* 9.6* 8.9*  HCT 40.7   < > 34.1* 33.8* 31.4* 31.4* 28.4*  MCV 99.0   < > 100.9* 102.4* 102.6* 103.3* 101.1*  PLT 140*   < > 92* 72* 59* 49* 59*   < > = values in this interval not displayed.   Basic Metabolic Panel: Recent Labs  Lab 10/31/19 0941 10/31/19 0941 11/01/19 0427 11/02/19 0828 11/03/19 0547 11/04/19 0239 11/05/19 0359  NA 134*  --   --  136 135 136 134*  K 5.4*  --   --  4.5 4.3 5.1 4.7  CL 92*  --   --  102 102 102 103  CO2 27  --   --  24 24 23 23   GLUCOSE 135*  --   --  105* 108* 189* 117*  BUN 52*  --   --  38* 41* 47* 56*  CREATININE 2.04*   < > 1.43* 1.59* 1.81* 2.15* 1.68*  CALCIUM 9.4  --   --  8.6* 8.4* 8.9 8.5*  MG  --   --   --  2.0 2.1 2.0 2.1   < > = values in this interval not displayed.   GFR: Estimated Creatinine Clearance: 28 mL/min (A) (by C-G formula based on SCr of 1.68 mg/dL (H)). Liver Function Tests: No results for input(s): AST, ALT, ALKPHOS, BILITOT, PROT, ALBUMIN in the last 168 hours. No results for  input(s): LIPASE, AMYLASE in the last 168 hours. No results for input(s): AMMONIA in the last 168 hours. Coagulation Profile: Recent Labs  Lab 11/01/19 0427  INR 1.8*   Cardiac Enzymes: No results for input(s): CKTOTAL, CKMB, CKMBINDEX, TROPONINI in the last 168 hours. BNP (last 3 results) No results for input(s): PROBNP in the last 8760 hours. HbA1C: No results for input(s): HGBA1C in the last 72 hours. CBG: Recent Labs  Lab 11/04/19 1148 11/04/19 1635 11/04/19 2113  11/05/19 0751 11/05/19 1202  GLUCAP 196* 132* 134* 99 111*   Lipid Profile: No results for input(s): CHOL, HDL, LDLCALC, TRIG, CHOLHDL, LDLDIRECT in the last 72 hours. Thyroid Function Tests: No results for input(s): TSH, T4TOTAL, FREET4, T3FREE, THYROIDAB in the last 72 hours. Anemia Panel: No results for input(s): VITAMINB12, FOLATE, FERRITIN, TIBC, IRON, RETICCTPCT in the last 72 hours. Sepsis Labs: Recent Labs  Lab 10/31/19 1117 11/01/19 0427  PROCALCITON  --  1.75  LATICACIDVEN 1.0 0.7    Recent Results (from the past 240 hour(s))  Culture, blood (routine x 2)     Status: None   Collection Time: 10/31/19  9:41 AM   Specimen: BLOOD LEFT FOREARM  Result Value Ref Range Status   Specimen Description   Final    BLOOD LEFT FOREARM Performed at [facility], [place] [address], [place], [place] [address]    Special Requests   Final    BOTTLES DRAWN AEROBIC AND ANAEROBIC Blood Culture adequate volume Performed at [place] [address] [place], [place] [address]    Culture   Final    NO GROWTH 5 DAYS Performed at [facility], [place] [address], [place], [place] [address]    Report Status 11/05/2019 FINAL  Final  Culture, blood (routine x 2)     Status: None   Collection Time: 10/31/19 11:10 AM   Specimen: BLOOD  Result Value Ref Range Status   Specimen Description   Final    BLOOD RIGHT ANTECUBITAL Performed at [facility] [address], [place], [place] [address]    Special Requests   Final    BOTTLES DRAWN AEROBIC AND ANAEROBIC Blood Culture adequate volume Performed at [place] [address] [place], [place] [address]    Culture   Final    NO GROWTH 5 DAYS Performed at [facility], [place] [address] [place], [place] [address]    Report Status 11/05/2019 FINAL  Final  SARS Coronavirus 2 by RT PCR (hospital order, performed in [facility] hospital lab) Nasopharyngeal  Nasopharyngeal Swab     Status: None   Collection Time: 10/31/19 12:08 PM   Specimen: Nasopharyngeal Swab  Result Value Ref Range Status   SARS Coronavirus 2 NEGATIVE NEGATIVE Final    Comment: (NOTE) SARS-CoV-2 target nucleic acids are NOT DETECTED.  The SARS-CoV-2 RNA is generally detectable in upper and lower respiratory specimens during the acute phase of infection. The lowest concentration of SARS-CoV-2 viral copies this assay can detect is 250 copies / mL. A negative result does not preclude SARS-CoV-2 infection and should not be used as the sole basis for treatment or other patient management decisions.  A negative result may occur with improper specimen collection / handling, submission of specimen other than nasopharyngeal swab, presence of viral mutation(s) within the areas targeted by this assay, and inadequate number of viral copies (<250 copies / mL). A negative result must be combined with clinical observations, patient  history, and epidemiological information.  Fact Sheet for Patients:   StrictlyIdeas.no  Fact Sheet for Healthcare Providers: BankingDealers.co.za  This test is not yet approved or  cleared by the [place] FDA and has been authorized for detection and/or diagnosis of SARS-CoV-2 by FDA under an Emergency Use Authorization (EUA).  This EUA will remain in effect (meaning this test can be used) for the duration of the COVID-19 declaration under Section 564(b)(1) of the Act, 21 U.S.C. section 360bbb-3(b)(1), unless the authorization is terminated or revoked sooner.  Performed at [facility], [place] [address], [place], [place] [address]   MRSA PCR Screening     Status: None   Collection Time: 11/01/19  2:47 AM   Specimen: Nasal Mucosa; Nasopharyngeal  Result Value Ref Range Status   MRSA by PCR NEGATIVE NEGATIVE Final    Comment:        The GeneXpert MRSA Assay (FDA approved for  NASAL specimens only), is one component of a comprehensive MRSA colonization surveillance program. It is not intended to diagnose MRSA infection nor to guide or monitor treatment for MRSA infections. Performed at [facility], [place] [address] [place], [place] [address]          Radiology Studies: No results found.      Scheduled Meds: . clorazepate  3.75 mg Oral BID  . dextromethorphan-guaiFENesin  1 tablet Oral BID  . insulin aspart  0-9 Units Subcutaneous TID AC & HS  . ipratropium-albuterol  3 mL Nebulization TID  . levothyroxine  112 mcg Oral Daily  . mouth rinse  15 mL Mouth Rinse BID  . rivaroxaban  20 mg Oral Q supper  . sodium chloride flush  10-40 mL Intracatheter Q12H  . valACYclovir  500 mg Oral Daily   Continuous Infusions: . sodium chloride 75 mL/hr at 11/05/19 0537  . ceFEPime (MAXIPIME) IV 2 g (11/05/19 0540)     LOS: 5 days    Time spent: 35 mins,More than 50% of that time was spent in counseling and/or coordination of care.      Shelly Coss, MD [facility] P8/24/2021, 12:29 PM

## 2019-11-05 NOTE — Progress Notes (Signed)
Daily Progress Note   Patient Name: Erin Myers       Date: 11/05/2019 DOB: 12/24/1940  Age: 79 y.o. MRN#: [id number] Attending Physician: Shelly Coss, MD Primary Care Physician: [name], MD Admit Date: 10/31/2019  Reason for Consultation/Follow-up: Establishing goals of care  Subjective:  Discussed case with Dr. Tawanna Solo.  He reports that [name] is wanting to transition home with hospice and is open to further conversation to discuss hospice care at home.  I saw and examined her this evening.  We discussed her clinical course and options for care moving forward.  She reports the physicians have been doing the job explaining things to her, and she understands options for her care moving forward including continuation of systemic therapy with Dr. Earlie Server versus working transition home with the support of hospice.  She says after discussing today with Dr. Earlie Server, she wants to work to transition home with the support of hospice.  We discussed hospice benefits and care plan that would be entailed in transition home with hospice.  She [place] in the [place] part [place] town and would like to work with Manufacturing engineer on discharge.  Length of Stay: 5  Current Medications: Scheduled Meds:   clorazepate  3.75 mg Oral BID   dextromethorphan-guaiFENesin  1 tablet Oral BID   insulin aspart  0-9 Units Subcutaneous TID AC & HS   ipratropium-albuterol  3 mL Nebulization TID   levothyroxine  112 mcg Oral Daily   mouth rinse  15 mL Mouth Rinse BID   rivaroxaban  20 mg Oral Q supper   sodium chloride flush  10-40 mL Intracatheter Q12H   valACYclovir  500 mg Oral Daily    Continuous Infusions:  ceFEPime (MAXIPIME) IV 2 g (11/05/19 0540)    PRN Meds: acetaminophen  **OR** acetaminophen, bisacodyl, diphenhydrAMINE-zinc acetate, guaiFENesin-dextromethorphan, ipratropium-albuterol, ondansetron **OR** ondansetron (ZOFRAN) IV, polyethylene glycol, sodium chloride, sodium chloride flush, traZODone  Physical Exam Constitutional:      Appearance: She is ill-appearing.  Pulmonary:     Effort: Tachypnea present.  Skin:    General: Skin is warm and dry.  Neurological:     Mental Status: She is alert and oriented to person, place, and time.  Psychiatric:        Mood and Affect: Mood normal.        Behavior: Behavior normal.             Vital Signs: BP (!) 122/49 (BP Location: Right Arm)    Pulse 82    Temp 98 F (36.7 C)    Resp 17    Ht 5\' 5"  (1.651 m)    Wt 70 kg    SpO2 93%    BMI 25.68 kg/m  SpO2: SpO2: 93 % O2 Device: O2 Device: Nasal Cannula O2 Flow Rate: O2 Flow Rate (L/min): 8 L/min  Intake/output summary:   Intake/Output Summary (Last 24 hours) at 11/05/2019 1853 Last data filed at 11/05/2019 1500 Gross per 24 hour  Intake 1895.35 ml  Output 2 ml  Net 1893.35 ml   LBM: Last BM Date: 11/02/19 Baseline Weight: Weight: 72.5 kg Most recent weight: Weight: 70 kg       Palliative Assessment/Data:  PPS 40%    Flowsheet Rows     Most Recent Value  Intake Tab  Referral Department Oncology  Unit at Time of Referral Intermediate Care Unit  Palliative Care Primary Diagnosis Cancer  Date Notified 11/01/19  Palliative Care Type New Palliative care  Reason for referral Clarify Goals of Care  Date of Admission 10/31/19  Date first seen by Palliative Care 11/01/19  # of days Palliative referral response time 0 Day(s)  # of days IP prior to Palliative referral 1  Clinical Assessment  Palliative Performance Scale Score 50%  Psychosocial & Spiritual Assessment  Palliative Care Outcomes  Patient/Family meeting held? Yes  Who was at the meeting? patient and spouse  Palliative Care Outcomes Clarified goals of care      Patient Active Problem  List   Diagnosis Date Noted   Rash 11/04/2019   Hyperkalemia 11/01/2019   Hyponatremia 11/01/2019   Adenocarcinoma of lung, stage 4 (HCC)    Acute respiratory failure with hypoxia (HCC)    DNR (do not resuscitate)    Diabetes mellitus without complication ([place])    History of pulmonary embolism    Severe sepsis with acute organ dysfunction ([place]) 10/31/2019   Palliative care by specialist    AKI (acute kidney injury) ([place]) 10/02/2019   Lobar pneumonia ([place]) 10/02/2019   Aortic atherosclerosis ([place]) 10/02/2019   Pneumonitis, interstitial ([place]) 09/19/2019   Pneumonitis 07/09/2019   Chronic hypoxemic respiratory failure ([place]) 05/30/2019   Recurrent right pleural effusion 06/28/2018   Antineoplastic chemotherapy induced anemia 05/16/2018   Bronchiectasis ([place]) 03/12/2018   Cough 11/10/2017   Bilateral pulmonary embolism ([place]) 10/19/2017   Port-A-Cath in place 08/18/2017   Encounter for antineoplastic immunotherapy 08/10/2017   Encounter for antineoplastic chemotherapy 08/10/2017   Adenocarcinoma of right lung, 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   Goals of care, counseling/discussion 07/31/2017   Sinusitis 07/19/2017   Acute on chronic respiratory failure with hypoxia ([place]) 07/19/2017   Hypokalemia 07/19/2017   Sepsis ([place]) 07/18/2017   Abnormal CT of the chest 07/14/2017   Hypoxemia 07/03/2017   Community acquired pneumonia of right upper lobe of lung 05/30/2017   Dyspnea 05/02/2017   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  She reports desire to transition home with hospice  support.  Recommendations/Plan: - Education on home hospice provided.  She reports she would like to work to transition home with the support of hospice.  Discussed options for hospice care and she would like to work with [place] placed to transition of care to facilitate transition home with hospice services.  Code Status:  DNR  Prognosis:  < 6 months if her disease follows its natural course and she should qualify for hospice care moving forward  Discharge Planning:  Home with Hospice  Care plan was discussed with patient and Dr. Tawanna Solo.  Thank you for allowing the Palliative Medicine Team to assist in the care of this patient.   Total Time 30 minutes Prolonged Time Billed  no   Greater than 50%  of this time was spent counseling and coordinating care related to the above assessment and  plan.  Micheline Rough, MD [place] Team [phone]

## 2019-11-05 NOTE — Progress Notes (Addendum)
HEMATOLOGY-ONCOLOGY PROGRESS NOTE  SUBJECTIVE: The patient reports that she feels much better today.  Feels breathing is back to baseline.  Hoping to go home today.  Still has a productive cough.  No hemoptysis.  Oncology History  Adenocarcinoma of right lung, stage 4 ([place])  07/31/2017 Initial Diagnosis   Adenocarcinoma of right lung, stage 4 ([place])   08/18/2017 - 08/28/2019 Chemotherapy   The patient had palonosetron (ALOXI) injection 0.25 mg, 0.25 mg, Intravenous,  Once, 4 of 4 cycles Administration: 0.25 mg (08/18/2017), 0.25 mg (09/07/2017), 0.25 mg (09/28/2017), 0.25 mg (10/19/2017) PEMEtrexed (ALIMTA) 1,000 mg in sodium chloride 0.9 % 100 mL chemo infusion, 485 mg/m2 = 1,025 mg, Intravenous,  Once, 15 of 15 cycles Administration: 1,000 mg (08/18/2017), 1,000 mg (09/07/2017), 1,000 mg (09/28/2017), 1,000 mg (10/19/2017), 1,000 mg (11/09/2017), 1,000 mg (11/30/2017), 1,000 mg (12/19/2017), 1,000 mg (01/11/2018), 1,000 mg (02/01/2018), 1,000 mg (02/22/2018), 1,000 mg (03/15/2018), 1,000 mg (04/05/2018), 1,000 mg (04/26/2018), 1,000 mg (05/16/2018), 1,000 mg (06/07/2018) CARBOplatin (PARAPLATIN) 490 mg in sodium chloride 0.9 % 250 mL chemo infusion, 490 mg (100 % of original dose 489.5 mg), Intravenous,  Once, 4 of 4 cycles Dose modification: 489.5 mg (original dose 489.5 mg, Cycle 1), 489.5 mg (original dose 489.5 mg, Cycle 2), 489.5 mg (original dose 489.5 mg, Cycle 3), 489.5 mg (original dose 489.5 mg, Cycle 4) Administration: 490 mg (08/18/2017), 490 mg (09/07/2017), 490 mg (09/28/2017), 490 mg (10/19/2017) pembrolizumab (KEYTRUDA) 200 mg in sodium chloride 0.9 % 50 mL chemo infusion, 200 mg, Intravenous, Once, 23 of 28 cycles Administration: 200 mg (08/18/2017), 200 mg (11/09/2017), 200 mg (09/07/2017), 200 mg (11/30/2017), 200 mg (09/28/2017), 200 mg (10/19/2017), 200 mg (12/19/2017), 200 mg (01/11/2018), 200 mg (02/01/2018), 200 mg (02/22/2018), 200 mg (03/15/2018), 200 mg (04/05/2018), 200 mg (04/26/2018), 200 mg (05/16/2018), 200 mg  (06/07/2018), 200 mg (02/12/2019), 200 mg (03/05/2019), 200 mg (03/26/2019), 200 mg (04/16/2019), 200 mg (05/07/2019), 200 mg (05/28/2019), 200 mg (06/17/2019), 200 mg (08/28/2019) fosaprepitant (EMEND) 150 mg, dexamethasone (DECADRON) 12 mg in sodium chloride 0.9 % 145 mL IVPB, , Intravenous,  Once, 4 of 4 cycles Administration:  (08/18/2017),  (09/07/2017),  (09/28/2017),  (10/19/2017)  for chemotherapy treatment.    10/24/2019 -  Chemotherapy   The patient had PEMEtrexed (ALIMTA) 900 mg in sodium chloride 0.9 % 100 mL chemo infusion, 500 mg/m2 = 900 mg, Intravenous,  Once, 1 of 9 cycles Administration: 900 mg (10/24/2019)  for chemotherapy treatment.       REVIEW OF SYSTEMS:   Constitutional: Denies fevers and chills Respiratory: Shortness of breath improved, still has a productive cough.  Denies hemoptysis Cardiovascular: Denies palpitation, chest discomfort Gastrointestinal:  Denies nausea, heartburn or change in bowel habits Skin: Denies abnormal skin rashes Lymphatics: Denies new lymphadenopathy or easy bruising Neurological:Denies numbness, tingling or new weaknesses Behavioral/Psych: Mood is stable, no new changes  Extremities: No lower extremity edema All other systems were reviewed with the patient and are negative.  I have reviewed the past medical history, past surgical history, social history and family history with the patient and they are unchanged from previous note.   PHYSICAL EXAMINATION: ECOG PERFORMANCE STATUS: 3 - Symptomatic, >50% confined to bed  Vitals:   11/05/19 0520 11/05/19 0802  BP: (!) 122/49   Pulse: 82   Resp: 17   Temp: 98 F (36.7 C)   SpO2: 97% 96%   Filed Weights   10/31/19 0941 11/01/19 0241  Weight: 72.5 kg 70 kg    Intake/Output from previous day:  08/23 0701 - 08/24 0700 In: 2648.3 [P.O.:1440; I.V.:1108.3; IV Piggyback:100] Out: -   GENERAL: Sitting up in bed, eating breakfast, no distress LUNGS: Coarse breath sounds bilaterally HEART: regular  rate & rhythm and no murmurs and no lower extremity edema ABDOMEN:abdomen soft, non-tender and normal bowel sounds NEURO: alert & oriented x 3 with fluent speech, no focal motor/sensory deficits  LABORATORY DATA:  I have reviewed the data as listed CMP Latest Ref Rng & Units 11/05/2019 11/04/2019 11/03/2019  Glucose 70 - 99 mg/dL 117(H) 189(H) 108(H)  BUN 8 - 23 mg/dL 56(H) 47(H) 41(H)  Creatinine 0.44 - 1.00 mg/dL 1.68(H) 2.15(H) 1.81(H)  Sodium 135 - 145 mmol/L 134(L) 136 135  Potassium 3.5 - 5.1 mmol/L 4.7 5.1 4.3  Chloride 98 - 111 mmol/L 103 102 102  CO2 22 - 32 mmol/L 23 23 24   Calcium 8.9 - 10.3 mg/dL 8.5(L) 8.9 8.4(L)  Total Protein 6.5 - 8.1 g/dL - - -  Total Bilirubin 0.3 - 1.2 mg/dL - - -  Alkaline Phos 38 - 126 U/L - - -  AST 15 - 41 U/L - - -  ALT 0 - 44 U/L - - -    Lab Results  Component Value Date   WBC 7.7 11/05/2019   HGB 8.9 (L) 11/05/2019   HCT 28.4 (L) 11/05/2019   MCV 101.1 (H) 11/05/2019   PLT 59 (L) 11/05/2019   NEUTROABS 6.4 11/05/2019    DG Chest Port 1 View  Result Date: 10/31/2019 CLINICAL DATA:  Shortness of breath cough EXAM: PORTABLE CHEST 1 VIEW COMPARISON:  10/06/2019 FINDINGS: RIGHT-sided Port-A-Cath remains in place terminating in the upper RIGHT atrium. Trachea midline. Cardiomediastinal contours and hilar structures are unremarkable. Persistent interstitial and airspace disease in the RIGHT chest predominantly throughout the RIGHT chest also in the LEFT lung base. On limited assessment visualized skeletal structures without acute process. IMPRESSION: Persistent bilateral airspace and interstitial changes without significant change since prior imaging suspicious for pneumonia. Asymmetric edema could have a similar appearance though prior cross-sectional imaging appeared more compatible with pneumonia. Possible small effusions. Electronically Signed   By: Zetta Bills M.D.   On: 10/31/2019 10:16   DG CHEST PORT 1 VIEW  Result Date:  10/06/2019 CLINICAL DATA:  Respiratory distress, oxygen desaturation EXAM: PORTABLE CHEST 1 VIEW COMPARISON:  10/05/2019 FINDINGS: Slight interval increase in heterogeneous airspace opacity of the right lung, particularly in the right upper lobe. Unchanged heterogeneous opacity elsewhere, with small, layering bilateral pleural effusions. Right chest port catheter. Heart and mediastinum are unremarkable. IMPRESSION: 1. Slight interval increase in heterogeneous airspace opacity of the right lung, particularly in the right upper lobe, consistent with worsened edema and/or infection. 2. Unchanged heterogeneous opacity elsewhere, with small, layering bilateral pleural effusions. Electronically Signed   By: Eddie Candle M.D.   On: 10/06/2019 10:09    ASSESSMENT AND PLAN: This is a pleasant 79 year old Caucasian female with stage IV non-small cell lung cancer, well differentiated adenocarcinoma.  The patient presented with multifocal disease involving mainly the right upper lobe as well as the right middle lobe and left lower lobe.  Diagnosed in May 2019.  No actionable mutations.    She received systemic chemotherapy with carboplatin, Alimta, and Keytruda and then starting from cycle #5 we will start on maintenance Alimta and Keytruda.  Alimta was discontinued secondary to intolerance with fatigue.  She requested take a break after cycle #15.  She received her treatment with single agent Keytruda in December 2020 with cycle #  16.  She is status post 23 cycles of treatment.  At her last visit in our office on 10/17/2019, treatment options were discussed with her including a referral to palliative care/hospice versus continuing on single agent Alimta 500 mg IV every 3 weeks.  Docetaxel and Cyramza were also considered but due to her performance status, she was not a candidate for these agents.  The patient opted to proceed with single agent Alimta and received this on 10/24/2019.  She is also awaiting a second opinion  at [facility].  The patient has been admitted secondary to sepsis due to pneumonia and acute respiratory failure with hypoxia.  She is feeling much better.  She hopes to go home today.  The patient tells me that she is still considering hospice.  She would like to talk more with medical oncology regarding recommendations of continuing chemotherapy versus going on hospice and focusing on comfort.  She would like to know how likely her current chemotherapy will be effective for her cancer and if it may worsen her quality of life.  She still also like to pursue a second opinion at [facility] to see if there are any other treatment options.  We plan to continue ongoing discussions of goals of care with the patient and her family.  If she remains in the hospital today, she will be seen later today by Dr. [name].  If she is discharged, she has an outpatient follow-up already scheduled on 11/13/2019 and will continue these discussions.   LOS: 5 days   Mikey Bussing, DNP, AGPCNP-BC, AOCNP 11/05/19   ADDENDUM: Hematology/Oncology Attending: I had a face-to-face encounter with the patient today.  I agree with the above note.  This is a very pleasant 79 years old white female with metastatic non-small cell lung cancer, adenocarcinoma status post induction systemic chemotherapy with carboplatin, Alimta and Keytruda followed by maintenance treatment with Alimta and Keytruda.  Her chemotherapy was discontinued secondary to intolerance and the patient continued on single agent Keytruda for several months. Unfortunately she had evidence for disease progression several weeks ago.  She wanted to resume her treatment with single agent Alimta again and she received 1 cycle but she could not tolerate the treatment and she was admitted to the hospital with respiratory failure in addition to pancytopenia and suspicious pneumonia and COPD exacerbation. I had a lengthy discussion with the patient and her daughter who was  available at the bedside today about her current condition and treatment options.  I strongly recommend for the patient to consider palliative care and hospice at this point because of the intolerance to the chemotherapy and lack of great options for treatment of her condition.  The patient and her daughter are in agreement with the current plan and she would like to proceed with the hospice service as recommended. We will continue to monitor her closely on outpatient basis and help with her hospice needs. The patient is currently no CODE BLUE. Thank you so much for taking good care of Ms. Settlemire.  Please call if you have any questions.  Disclaimer: This note was dictated with voice recognition software. Similar sounding words can inadvertently be transcribed and may be missed upon review. Eilleen Kempf, MD

## 2019-11-06 ENCOUNTER — Inpatient Hospital Stay (HOSPITAL_COMMUNITY): Payer: PPO

## 2019-11-06 LAB — FUNGUS CULTURE WITH STAIN

## 2019-11-06 LAB — CBC WITH DIFFERENTIAL/PLATELET
Abs Immature Granulocytes: 0.09 10*3/uL — ABNORMAL HIGH (ref 0.00–0.07)
Basophils Absolute: 0 10*3/uL (ref 0.0–0.1)
Basophils Relative: 0 %
Eosinophils Absolute: 0.1 10*3/uL (ref 0.0–0.5)
Eosinophils Relative: 1 %
HCT: 30.1 % — ABNORMAL LOW (ref 36.0–46.0)
Hemoglobin: 9.1 g/dL — ABNORMAL LOW (ref 12.0–15.0)
Immature Granulocytes: 2 %
Lymphocytes Relative: 14 %
Lymphs Abs: 0.8 10*3/uL (ref 0.7–4.0)
MCH: 31.4 pg (ref 26.0–34.0)
MCHC: 30.2 g/dL (ref 30.0–36.0)
MCV: 103.8 fL — ABNORMAL HIGH (ref 80.0–100.0)
Monocytes Absolute: 0.3 10*3/uL (ref 0.1–1.0)
Monocytes Relative: 5 %
Neutro Abs: 4.1 10*3/uL (ref 1.7–7.7)
Neutrophils Relative %: 78 %
Platelets: 48 10*3/uL — ABNORMAL LOW (ref 150–400)
RBC: 2.9 MIL/uL — ABNORMAL LOW (ref 3.87–5.11)
RDW: 14.4 % (ref 11.5–15.5)
WBC: 5.3 10*3/uL (ref 4.0–10.5)
nRBC: 0 % (ref 0.0–0.2)

## 2019-11-06 LAB — BASIC METABOLIC PANEL
Anion gap: 8 (ref 5–15)
BUN: 49 mg/dL — ABNORMAL HIGH (ref 8–23)
CO2: 24 mmol/L (ref 22–32)
Calcium: 8.7 mg/dL — ABNORMAL LOW (ref 8.9–10.3)
Chloride: 108 mmol/L (ref 98–111)
Creatinine, Ser: 1.52 mg/dL — ABNORMAL HIGH (ref 0.44–1.00)
GFR calc Af Amer: 38 mL/min — ABNORMAL LOW (ref >60)
GFR calc non Af Amer: 33 mL/min — ABNORMAL LOW (ref >60)
Glucose, Bld: 96 mg/dL (ref 70–99)
Potassium: 4.1 mmol/L (ref 3.5–5.1)
Sodium: 140 mmol/L (ref 135–145)

## 2019-11-06 LAB — FUNGAL ORGANISM REFLEX

## 2019-11-06 LAB — GLUCOSE, CAPILLARY
Glucose-Capillary: 81 mg/dL (ref 70–99)
Glucose-Capillary: 127 mg/dL — ABNORMAL HIGH (ref 70–99)
Glucose-Capillary: 92 mg/dL (ref 70–99)
Glucose-Capillary: 113 mg/dL — ABNORMAL HIGH (ref 70–99)

## 2019-11-06 LAB — FUNGUS CULTURE RESULT

## 2019-11-06 MED ORDER — FUROSEMIDE 10 MG/ML IJ SOLN
20.0000 mg | Freq: Once | INTRAMUSCULAR | Status: AC
  Administered 2019-11-06: 20 mg via INTRAVENOUS
  Filled 2019-11-06: qty 2

## 2019-11-06 MED ORDER — GUAIFENESIN-DM 100-10 MG/5ML PO SYRP
10.0000 mL | ORAL_SOLUTION | Freq: Four times a day (QID) | ORAL | Status: DC
  Administered 2019-11-06 – 2019-11-07 (×3): 10 mL via ORAL
  Filled 2019-11-06 (×3): qty 10

## 2019-11-06 NOTE — Progress Notes (Signed)
OT Cancellation Note  Patient Details Name: Erin Myers MRN: [id number] DOB: September 21, 1940   Cancelled Treatment:    Reason Eval/Treat Not Completed: Other (comment) Per chart review patient has declined acute therapy services and per palliative note plans to transition home with hospice services. Will discontinue acute OT at this time, please re-order if new needs arise.  Delbert Phenix OT OT pager: [place] 11/06/2019, 6:43 AM

## 2019-11-06 NOTE — Progress Notes (Signed)
Sats 98%. Pt requested to stay on 8 LPM HFNC.

## 2019-11-06 NOTE — TOC Progression Note (Addendum)
Transition of Care [facility]) - Progression Note    Patient Details  Name: Erin Myers MRN: [id number] Date of Birth: [birth year]  [facility]) CM/SW Contact  Rusell Meneely, Juliann Pulse, RN Phone Number: 11/06/2019, 11:38 AM  Clinical Narrative:1:13p-[facility] able to accept-dme to be delivered to home tomorrow. Patient/spouse agree to d/c tomorrow.MD updated.Transport home on own. Referral for home with hospice choice-patient/spouse chose [facility]-rep Chrislyn aware of referral-await if able to accept. Patient already receives home 02-[facility]. Family will transport home on own.       Expected Discharge Plan: [place]    Expected Discharge Plan and Services Expected Discharge Plan: [place]         Expected Discharge Date:  (unknown)                         HH Arranged: RN [facility] Agency: Hospice and [place] Date [place]: 11/06/19 Time [place]: 1138 Representative spoke with at [place]: [place] Determinants of Health ([place]) Interventions    Readmission Risk Interventions No flowsheet data found.

## 2019-11-06 NOTE — Progress Notes (Signed)
PROGRESS NOTE    Erin Myers  HBZ:[id number] DOB: [birth year] DOA: 10/31/2019 PCP: [name], MD   Brief Narrative:   Patient  is a 79 yo CF with PMH NSCLC, adenocarcinoma (chronic 8L O2 at home), DMII, HTN, OSA on CPAP, HTN, hypothyroidism, anxiety who presented with worsening SOB and hypoxia at home.  She has been on systemic chemo previously and per oncology is s/p 23 cycles at this point. Previous oncology notes also reviewed; she has been on single agent Alimta 500 mg IV q3 weeks.  She was also recently hospitalized 09/30/2019 until 10/12/2019 for ongoing respiratory distress, pneumonia, interstitial pneumonitis.  She was treated with antibiotics, steroids, and bronchodilators.  She was weaned down to 7 to 8 L nasal cannula oxygen by discharge. Her CXR on admission was actually improved in comparison to her last CXR on 10/06/2019. She was admitted and started on vancomycin, cefepime, doxycycline.  She was also noted to have an AKI on admission as well and started on IV fluids. Due to her worsening overall status, advanced malignancy, goals of care were discussed and her CODE STATUS was changed to DNR.  She was also evaluated by oncology and palliative care. Patient is now interested on hospice  at home.  She does not want to continue chemotherapy .  Plan is to discharge to home with hospice tomorrow.  Assessment & Plan:   Active Problems:   OSA on CPAP   Essential hypertension   Hypothyroid   AKI (acute kidney injury) (HCC)   Severe sepsis with acute organ dysfunction (HCC)   Adenocarcinoma of lung, stage 4 (HCC)   Acute respiratory failure with hypoxia (HCC)   DNR (do not resuscitate)   Hyperkalemia   Hyponatremia   Diabetes mellitus without complication ([place])   History of pulmonary embolism   Rash   Severe sepsis with acute organ dysfunction ([place]) - organ dysfunction due to pneumonia in immunocompromised patient-on presentation she was tachycardic,  tachypneicandleukopenic.Source of infection is pneumonia. Has AKI and mildly elevated troponin. -Broad-spectrum antibiotic with cefepime, vancomycin and doxycycline; on admission. - vancomycin D/ced - On cefepime ,day 7/7.Completed course - follow up any pending cultures: remain negative  -BP is stable now  Adenocarcinoma of lung, stage 4 (HCC) - patient has had 23 cycles of chemo at this point.  She has poor functional status with ongoing decline and has been on monotherapy Alimta.  She has not been a candidate for other treatments due to her functional status.  She is now dependent on 8 L nasal cannula oxygen at home and is now having multiple hospitalizations with worsening quality of life.  Bedside discussions held this morning with patient and her husband.  We discussed goals of care and she then elected for becoming DNR . Patient is now interested on hospice  at home.  She does not want to continue chemotherapy .  Acute respiratory failure with hypoxia (HCC) - continue O2, escalate if needed but patient now DNR -Continue on 7 to 8 L of oxygen per minute. -Chest x-ray done this morning still shows multifocal pneumonia, some  pulmonary edema.  We will give her a dose of Lasix 20 mg IV once.  We can give Lasix IV as needed if her blood pressure tolerates.  AKI (acute kidney injury) ([place]) - Improved with IV fluids.  IV fluids stopped.  Hyperkalemia - Resolved  Essential hypertension -Currently stable.  Antihypertensives on hold.  Diabetes mellitus without complication ([place]) - follow up A1c = 5.9% -  continue SSI and monitor CBGs  History of pulmonary embolism - continue Xarelto -Has thrombocytopenia but improved to 59 .  So Xarelto resumed.Since she is being discharged to home with hospice, will discontinue Xarelto.  Hypothyroid - continue synthroid  OSA on CPAP - continue CPAP  Rash -Appears to have developed a possible drug rash on 11/03/2019.  She has been on  cefepime and doxycycline.  Unclear at this time which one is associated with rash -No associated shortness of breath, stridor, or difficulty breathing -Was given IV Benadryl and Solu-Medrol on 11/03/2019.  Continue Benadryl cream -Doxycycline discontinued     Debility/deconditioning -PT consulted.  But now the plan is for discharge to home with hospice.         DVT prophylaxis:Xarelto Code Status: DNR Family Communication:daughter at bed side  Status is: Inpatient  Remains inpatient appropriate because:Hemodynamically unstable   Dispo: The patient is from: Home              Anticipated d/c is to: Home with hospice tomorrow              Anticipated d/c date is: 1 day              Patient currently is not medically stable to d/c.  After discussion with family, case manager decided to set up everything by tomorrow.  Discharge planning tomorrow.  Consultants: Oncology, palliative care  Procedures: None  Antimicrobials:  Anti-infectives (From admission, onward)   Start     Dose/Rate Route Frequency Ordered Stop   11/04/19 0600  ceFEPIme (MAXIPIME) 2 g in sodium chloride 0.9 % 100 mL IVPB        2 g 200 mL/hr over 30 Minutes Intravenous Every 24 hours 11/03/19 1041 11/06/19 0553   11/01/19 2000  valACYclovir (VALTREX) tablet 500 mg        500 mg Oral Daily 11/01/19 1826     11/01/19 1800  ceFEPIme (MAXIPIME) 2 g in sodium chloride 0.9 % 100 mL IVPB  Status:  Discontinued        2 g 200 mL/hr over 30 Minutes Intravenous Every 24 hours 10/31/19 1427 11/01/19 1231   11/01/19 1600  vancomycin (VANCOREADY) IVPB 750 mg/150 mL  Status:  Discontinued        750 mg 150 mL/hr over 60 Minutes Intravenous Every 24 hours 10/31/19 1423 11/01/19 1303   11/01/19 1600  ceFEPIme (MAXIPIME) 2 g in sodium chloride 0.9 % 100 mL IVPB  Status:  Discontinued        2 g 200 mL/hr over 30 Minutes Intravenous Every 12 hours 11/01/19 1231 11/03/19 1041   11/01/19 1000  doxycycline (VIBRA-TABS) tablet  100 mg  Status:  Discontinued        100 mg Oral Every 12 hours 10/31/19 1402 11/03/19 1835   10/31/19 1430  ceFEPIme (MAXIPIME) 2 g in sodium chloride 0.9 % 100 mL IVPB        2 g 200 mL/hr over 30 Minutes Intravenous STAT 10/31/19 1402 10/31/19 1912   10/31/19 1415  vancomycin (VANCOCIN) IVPB 1000 mg/200 mL premix  Status:  Discontinued        1,000 mg 200 mL/hr over 60 Minutes Intravenous  Once 10/31/19 1402 10/31/19 1414   10/31/19 1415  vancomycin (VANCOREADY) IVPB 1500 mg/300 mL        1,500 mg 150 mL/hr over 120 Minutes Intravenous  Once 10/31/19 1414 10/31/19 1832   10/31/19 1030  cefTRIAXone (ROCEPHIN) 1 g in sodium chloride  0.9 % 100 mL IVPB  Status:  Discontinued        1 g 200 mL/hr over 30 Minutes Intravenous  Once 10/31/19 1020 10/31/19 1025   10/31/19 1030  azithromycin (ZITHROMAX) 500 mg in sodium chloride 0.9 % 250 mL IVPB        500 mg 250 mL/hr over 60 Minutes Intravenous  Once 10/31/19 1020 10/31/19 1312   10/31/19 1030  cefTRIAXone (ROCEPHIN) 2 g in sodium chloride 0.9 % 100 mL IVPB  Status:  Discontinued        2 g 200 mL/hr over 30 Minutes Intravenous Every 24 hours 10/31/19 1024 10/31/19 1402      Subjective:  Patient seen and examined the bedside this morning.  Hemodynamically stable.  She sounds congested today.  Was coughing.  On 7 to 8 L of oxygen per minute.  Objective: Vitals:   11/05/19 1412 11/05/19 2042 11/05/19 2105 11/06/19 0438  BP:  (!) 118/49  (!) 130/48  Pulse:  (!) 106  95  Resp:  20  20  Temp:  97.9 F (36.6 C)  98 F (36.7 C)  TempSrc:  Oral  Oral  SpO2: 93% 97% 92% 95%  Weight:      Height:        Intake/Output Summary (Last 24 hours) at 11/06/2019 0725 Last data filed at 11/06/2019 0630 Gross per 24 hour  Intake 1329.96 ml  Output 602 ml  Net 727.96 ml   Filed Weights   10/31/19 0941 11/01/19 0241  Weight: 72.5 kg 70 kg    Examination:   General exam: Chronically ill looking, deconditioned, debilitated Respiratory  system: Bilateral rhonchi, crackles  cardiovascular system: S1 & S2 heard, RRR. No JVD, murmurs, rubs, gallops or clicks. Gastrointestinal system: Abdomen is nondistended, soft and nontender. No organomegaly or masses felt. Normal bowel sounds heard. Central nervous system: Alert and oriented. No focal neurological deficits. Extremities: No edema, no clubbing ,no cyanosis Skin: No rashes, lesions or ulcers,no icterus ,no pallor    Data Reviewed: I have personally reviewed following labs and imaging studies  CBC: Recent Labs  Lab 11/02/19 0828 11/03/19 0547 11/04/19 0239 11/05/19 0359 11/06/19 0355  WBC 4.7 5.8 4.9 7.7 5.3  NEUTROABS 3.9 4.8 4.5 6.4 4.1  HGB 10.5* 9.8* 9.6* 8.9* 9.1*  HCT 33.8* 31.4* 31.4* 28.4* 30.1*  MCV 102.4* 102.6* 103.3* 101.1* 103.8*  PLT 72* 59* 49* 59* 48*   Basic Metabolic Panel: Recent Labs  Lab 11/02/19 0828 11/03/19 0547 11/04/19 0239 11/05/19 0359 11/06/19 0355  NA 136 135 136 134* 140  K 4.5 4.3 5.1 4.7 4.1  CL 102 102 102 103 108  CO2 24 24 23 23 24   GLUCOSE 105* 108* 189* 117* 96  BUN 38* 41* 47* 56* 49*  CREATININE 1.59* 1.81* 2.15* 1.68* 1.52*  CALCIUM 8.6* 8.4* 8.9 8.5* 8.7*  MG 2.0 2.1 2.0 2.1  --    GFR: Estimated Creatinine Clearance: 30.9 mL/min (A) (by C-G formula based on SCr of 1.52 mg/dL (H)). Liver Function Tests: No results for input(s): AST, ALT, ALKPHOS, BILITOT, PROT, ALBUMIN in the last 168 hours. No results for input(s): LIPASE, AMYLASE in the last 168 hours. No results for input(s): AMMONIA in the last 168 hours. Coagulation Profile: Recent Labs  Lab 11/01/19 0427  INR 1.8*   Cardiac Enzymes: No results for input(s): CKTOTAL, CKMB, CKMBINDEX, TROPONINI in the last 168 hours. BNP (last 3 results) No results for input(s): PROBNP in the last 8760 hours. HbA1C:  No results for input(s): HGBA1C in the last 72 hours. CBG: Recent Labs  Lab 11/04/19 2113 11/05/19 0751 11/05/19 1202 11/05/19 1639  11/05/19 2043  GLUCAP 134* 99 111* 81 124*   Lipid Profile: No results for input(s): CHOL, HDL, LDLCALC, TRIG, CHOLHDL, LDLDIRECT in the last 72 hours. Thyroid Function Tests: No results for input(s): TSH, T4TOTAL, FREET4, T3FREE, THYROIDAB in the last 72 hours. Anemia Panel: No results for input(s): VITAMINB12, FOLATE, FERRITIN, TIBC, IRON, RETICCTPCT in the last 72 hours. Sepsis Labs: Recent Labs  Lab 10/31/19 1117 11/01/19 0427  PROCALCITON  --  1.75  LATICACIDVEN 1.0 0.7    Recent Results (from the past 240 hour(s))  Culture, blood (routine x 2)     Status: None   Collection Time: 10/31/19  9:41 AM   Specimen: BLOOD LEFT FOREARM  Result Value Ref Range Status   Specimen Description   Final    BLOOD LEFT FOREARM Performed at [facility], [place] [address] [place], [place] [address]    Special Requests   Final    BOTTLES DRAWN AEROBIC AND ANAEROBIC Blood Culture adequate volume Performed at [facility] [address] [place], [place] [address]    Culture   Final    NO GROWTH 5 DAYS Performed at [facility], [place] [address] [place], [place] [address]    Report Status 11/05/2019 FINAL  Final  Culture, blood (routine x 2)     Status: None   Collection Time: 10/31/19 11:10 AM   Specimen: BLOOD  Result Value Ref Range Status   Specimen Description   Final    BLOOD RIGHT ANTECUBITAL Performed at [place] [address], [place], [place] [address]    Special Requests   Final    BOTTLES DRAWN AEROBIC AND ANAEROBIC Blood Culture adequate volume Performed at [facility] [address], [place], [place] [address]    Culture   Final    NO GROWTH 5 DAYS Performed at [facility], [place] [address], [place], [place] [address]    Report Status 11/05/2019 FINAL  Final  SARS Coronavirus 2 by RT PCR (hospital order, performed in [facility] hospital lab) Nasopharyngeal Nasopharyngeal Swab      Status: None   Collection Time: 10/31/19 12:08 PM   Specimen: Nasopharyngeal Swab  Result Value Ref Range Status   SARS Coronavirus 2 NEGATIVE NEGATIVE Final    Comment: (NOTE) SARS-CoV-2 target nucleic acids are NOT DETECTED.  The SARS-CoV-2 RNA is generally detectable in upper and lower respiratory specimens during the acute phase of infection. The lowest concentration of SARS-CoV-2 viral copies this assay can detect is 250 copies / mL. A negative result does not preclude SARS-CoV-2 infection and should not be used as the sole basis for treatment or other patient management decisions.  A negative result may occur with improper specimen collection / handling, submission of specimen other than nasopharyngeal swab, presence of viral mutation(s) within the areas targeted by this assay, and inadequate number of viral copies (<250 copies / mL). A negative result must be combined with clinical observations, patient history, and epidemiological information.  Fact Sheet for Patients:   StrictlyIdeas.no  Fact Sheet for Healthcare Providers: BankingDealers.co.za  This test is not yet approved or  cleared by the [place] FDA and has been authorized for detection and/or diagnosis of SARS-CoV-2 by FDA under an Emergency Use Authorization (EUA).  This EUA will remain in effect (meaning this  test can be used) for the duration of the COVID-19 declaration under Section 564(b)(1) of the Act, 21 U.S.C. section 360bbb-3(b)(1), unless the authorization is terminated or revoked sooner.  Performed at [facility], [place] [address], [place], [place] [address]   MRSA PCR Screening     Status: None   Collection Time: 11/01/19  2:47 AM   Specimen: Nasal Mucosa; Nasopharyngeal  Result Value Ref Range Status   MRSA by PCR NEGATIVE NEGATIVE Final    Comment:        The GeneXpert MRSA Assay (FDA approved for NASAL specimens only), is  one component of a comprehensive MRSA colonization surveillance program. It is not intended to diagnose MRSA infection nor to guide or monitor treatment for MRSA infections. Performed at [facility], [place] [address], [place], [place] [address]          Radiology Studies: No results found.      Scheduled Meds:  clorazepate  3.75 mg Oral BID   dextromethorphan-guaiFENesin  1 tablet Oral BID   insulin aspart  0-9 Units Subcutaneous TID AC & HS   ipratropium-albuterol  3 mL Nebulization TID   levothyroxine  112 mcg Oral Daily   mouth rinse  15 mL Mouth Rinse BID   rivaroxaban  20 mg Oral Q supper   sodium chloride flush  10-40 mL Intracatheter Q12H   valACYclovir  500 mg Oral Daily   Continuous Infusions:    LOS: 6 days    Time spent: 35 mins,More than 50% of that time was spent in counseling and/or coordination of care.      Shelly Coss, MD [facility] P8/25/2021, 7:25 AM

## 2019-11-06 NOTE — Progress Notes (Signed)
Manufacturing engineer [facility])  Received request from [facility] for hospice services at home after discharge.  Chart and pt information under review by [facility] physician.  Hospice eligibility pending at this time.  Hospital liaison spoke with spouse [name] to initiate education related to hospice philosophy and services and to answer any questions at this time.  [name]    verbalized understanding of information given.  Per discussion the plan is to discharge home tomorrow by private vehicle.    [place] send signed and completed DNR home with pt/family.  Please provide prescriptions at discharge as needed to ensure ongoing symptom management until patient can be admitted onto hospice services.    DME needs discussed.  [name] reports having supplemental O2, walker and BSC already, requests hospital bed and transport chair.  Address has been verified and is correct in the chart.  ACC information and contact numbers given to [facility].  Above information shared with [place] [employer] Manager.  Please call with any questions or concerns.  Thank you for the opportunity to participate in this pt's care.  Domenic Moras, BSN, RN [employer] [phone] (24h on call)

## 2019-11-07 LAB — BASIC METABOLIC PANEL
Anion gap: 8 (ref 5–15)
BUN: 38 mg/dL — ABNORMAL HIGH (ref 8–23)
CO2: 26 mmol/L (ref 22–32)
Calcium: 8.6 mg/dL — ABNORMAL LOW (ref 8.9–10.3)
Chloride: 106 mmol/L (ref 98–111)
Creatinine, Ser: 1.6 mg/dL — ABNORMAL HIGH (ref 0.44–1.00)
GFR calc Af Amer: 36 mL/min — ABNORMAL LOW (ref >60)
GFR calc non Af Amer: 31 mL/min — ABNORMAL LOW (ref >60)
Glucose, Bld: 112 mg/dL — ABNORMAL HIGH (ref 70–99)
Potassium: 3.7 mmol/L (ref 3.5–5.1)
Sodium: 140 mmol/L (ref 135–145)

## 2019-11-07 LAB — GLUCOSE, CAPILLARY
Glucose-Capillary: 88 mg/dL (ref 70–99)
Glucose-Capillary: 93 mg/dL (ref 70–99)

## 2019-11-07 MED ORDER — POLYETHYLENE GLYCOL 3350 17 G PO PACK: 17.0000 g | PACK | Freq: Every day | ORAL | 0 refills | Status: AC | PRN

## 2019-11-07 MED ORDER — FUROSEMIDE 20 MG PO TABS: 20.0000 mg | ORAL_TABLET | Freq: Every day | ORAL | 1 refills | Status: AC

## 2019-11-07 MED ORDER — HEPARIN SOD (PORK) LOCK FLUSH 100 UNIT/ML IV SOLN
500.0000 [IU] | INTRAVENOUS | Status: AC | PRN
  Administered 2019-11-07: 500 [IU]

## 2019-11-07 MED ORDER — OXYCODONE HCL 5 MG PO TABS
5.0000 mg | ORAL_TABLET | Freq: Three times a day (TID) | ORAL | 0 refills | Status: AC | PRN
Start: 2019-11-07 — End: 2020-11-06

## 2019-11-07 MED ORDER — TRAZODONE HCL 50 MG PO TABS: 25.0000 mg | ORAL_TABLET | Freq: Every evening | ORAL | 0 refills | Status: AC | PRN

## 2019-11-07 NOTE — Progress Notes (Signed)
Pt and daughter explained and given discharge instructions. There were no questions or concerns. Port deaccessed by IV team. Pt dressed in personal clothing. Husband brought home O2 tank to pt room and pt placed on her personal oxygen. Pt taken via wheelchair to the front entrance.

## 2019-11-07 NOTE — TOC Transition Note (Signed)
Transition of Care [facility]) - CM/SW Discharge Note   Patient Details  Name: Erin Myers MRN: [id number] Date of Birth: [birth year]  Transition of Care [facility]) CM/SW Contact:  Dessa Phi, RN Phone Number: 11/07/2019, 11:40 AM   Clinical Narrative: Spoke to spouse David-d/c plan home w/hospice-[facility]-all dme arrived last night to home-family will transport home on own-has travel tank for 02.[name] expressed his concerns about the dme delivery person-[facility] rep Chrislyn aware.      Final next level of care: Home w Hospice Care Barriers to Discharge: No Barriers Identified   Patient Goals and CMS Choice        Discharge Placement                       Discharge Plan and Services                          HH Arranged: RN [facility] Agency: Hospice and [place] Date [place]: 11/06/19 Time [place]: 1138 Representative spoke with at [place]: [place] Determinants of Health ([place]) Interventions     Readmission Risk Interventions No flowsheet data found.

## 2019-11-07 NOTE — Progress Notes (Signed)
Daily Progress Note   Patient Name: Erin Myers       Date: 11/07/2019 DOB: [birth year]  Age: 79 y.o. MRN#: [id number] Attending Physician: Shelly Coss, MD Primary Care Physician: [name], MD Admit Date: 10/31/2019  Reason for Consultation/Follow-up: Establishing goals of care  Subjective:  I saw and examined Erin Myers this morning.  She was awake and alert and lying in bed.  No family at the bedside.  We discussed plan to discharge home with hospice services.  She reports that she has no questions regarding this and that services have been that set up through [place].  We discussed symptom management at home.  She currently reports that she feels her symptoms are fairly well controlled.  We discussed options for treatment of pain, anxiety, shortness of breath, nausea, agitation, insomnia, and constipation.  She feels as though she would be okay with current regimen on discharge and knows to call hospice if further concerns regarding symptom management when she is at home.  Length of Stay: 7  Current Medications: Scheduled Meds:  . clorazepate  3.75 mg Oral BID  . dextromethorphan-guaiFENesin  1 tablet Oral BID  . guaiFENesin-dextromethorphan  10 mL Oral Q6H  . insulin aspart  0-9 Units Subcutaneous TID AC & HS  . ipratropium-albuterol  3 mL Nebulization TID  . levothyroxine  112 mcg Oral Daily  . mouth rinse  15 mL Mouth Rinse BID  . sodium chloride flush  10-40 mL Intracatheter Q12H  . valACYclovir  500 mg Oral Daily    Continuous Infusions:   PRN Meds: acetaminophen **OR** acetaminophen, bisacodyl, diphenhydrAMINE-zinc acetate, ipratropium-albuterol, ondansetron **OR** ondansetron (ZOFRAN) IV, polyethylene glycol, sodium chloride, sodium chloride flush,  traZODone  Physical Exam Constitutional:      Appearance: She is ill-appearing.  Pulmonary:     Effort: Tachypnea present.  Skin:    General: Skin is warm and dry.  Neurological:     Mental Status: She is alert and oriented to person, place, and time.     Motor: Weakness present.  Psychiatric:        Mood and Affect: Mood normal.        Behavior: Behavior normal.             Vital Signs: BP (!) 105/53 (BP Location: Right Arm)   Pulse (!) 105   Temp 97.9 F (36.6 C) (Oral)   Resp 20   Ht 5\' 5"  (1.651 m)   Wt 70 kg   SpO2 (!) 89%   BMI 25.68 kg/m  SpO2: SpO2: (!) 89 % O2 Device: O2 Device: High Flow Nasal Cannula O2 Flow Rate: O2 Flow Rate (L/min): 8 L/min  Intake/output summary:   Intake/Output Summary (Last 24 hours) at 11/07/2019 1129 Last data filed at 11/07/2019 1000 Gross per 24 hour  Intake 600 ml  Output 1100 ml  Net -500 ml   LBM: Last BM Date: 11/06/19 Baseline Weight: Weight: 72.5 kg Most recent weight: Weight: 70 kg       Palliative Assessment/Data: PPS 40%    Flowsheet Rows     Most Recent Value  Intake Tab  Referral Department Oncology  Unit at Time of Referral Intermediate Care Unit  Palliative Care Primary Diagnosis Cancer  Date Notified 11/01/19  Palliative Care Type New Palliative care  Reason for referral Clarify Goals of Care  Date of Admission 10/31/19  Date first seen by Palliative Care 11/01/19  # of days Palliative referral response time 0 Day(s)  # of days IP prior to Palliative referral 1  Clinical Assessment  Palliative Performance Scale Score 50%  Psychosocial & Spiritual Assessment  Palliative Care Outcomes  Patient/Family meeting held? Yes  Who was at the meeting? patient and spouse  Palliative Care Outcomes Clarified goals of care      Patient Active Problem List   Diagnosis Date Noted  . Rash 11/04/2019  . Hyperkalemia 11/01/2019  . Hyponatremia 11/01/2019  . Adenocarcinoma of lung, stage 4 ([place])   . Acute  respiratory failure with hypoxia ([place])   . DNR (do not resuscitate)   . Diabetes mellitus without complication ([place])   . History of pulmonary embolism   . Severe sepsis with acute organ dysfunction ([place]) 10/31/2019  . Palliative care by specialist   . AKI (acute kidney injury) ([place]) 10/02/2019  . Lobar pneumonia ([place]) 10/02/2019  . Aortic atherosclerosis ([place]) 10/02/2019  . Pneumonitis, interstitial ([place]) 09/19/2019  . Pneumonitis 07/09/2019  . Chronic hypoxemic respiratory failure ([place]) 05/30/2019  . Recurrent right pleural effusion 06/28/2018  . Antineoplastic chemotherapy induced anemia 05/16/2018  . Bronchiectasis ([place]) 03/12/2018  . Cough 11/10/2017  . Bilateral pulmonary embolism ([place]) 10/19/2017  . Port-A-Cath in place 08/18/2017  . Encounter for antineoplastic immunotherapy 08/10/2017  . Encounter for antineoplastic chemotherapy 08/10/2017  . Adenocarcinoma of right lung, stage 4 ([place]) 07/31/2017  . Goals of care, counseling/discussion 07/31/2017  . Sinusitis 07/19/2017  . Acute on chronic respiratory failure with hypoxia ([place]) 07/19/2017  . Hypokalemia 07/19/2017  . Sepsis ([place]) 07/18/2017  . Abnormal CT of the chest 07/14/2017  . Hypoxemia 07/03/2017  . Community acquired pneumonia of right upper lobe of lung 05/30/2017  . Dyspnea 05/02/2017  . 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 History of posttraumatic stress disorder (PTSD) 05/14/2015  . Obesity (BMI 30-39.9) 08/20/2012  . Family history of coronary artery disease 08/17/2012  . Palpitations   . OSA on CPAP   . Essential hypertension   . Dyslipidemia, goal LDL below 130   . Hypothyroid     Palliative Care Assessment & Plan   79 year old female with metastatic lung cancer.  She reports desire to transition home with hospice support.  Recommendations/Plan: -Plan for home with hospice services through [facility].  Discharge plan for today. -She appears stable for discharge.  Discussed  symptom management needs.  She currently denies any needs and declines to adjust any medications prior to going home.  I talked with her about symptom management and encourage utilization of hospice and their expertise in symptom management to help her maintain her functional status and quality of life as well as possible for as long as possible.  Code Status:  DNR  Prognosis:  < 6 months if her disease follows its natural course and she should qualify for hospice care moving forward  Discharge Planning:  Home with Hospice  Care plan was discussed with patient and Dr. Tawanna Solo.  Thank you for allowing the Palliative Medicine Team to assist in the care of this patient.   Total Time 20 minutes Prolonged Time Billed  no   Greater than 50%  of this time was spent counseling and coordinating care related to the above assessment and plan.  Micheline Rough, MD [place] [facility] [phone]

## 2019-11-07 NOTE — Care Management Important Message (Signed)
Important Message  Patient Details IM Letter given to the Patient Name: Erin Myers MRN: [id number] Date of Birth: 02-14-1941   Medicare Important Message Given:  Yes     Kerin Salen 11/07/2019, 12:53 PM

## 2019-11-07 NOTE — Discharge Summary (Signed)
Physician Discharge Summary  Erin Myers HKV:[id number] DOB: January 23, 1941 DOA: 10/31/2019  PCP: [name], MD  Admit date: 10/31/2019 Discharge date: 11/07/2019  Admitted From: Home Disposition:  Home  Discharge Condition:Stable CODE STATUS:DnR Diet recommendation: Heart Healthy    Brief/Interim Summary:  Patient  is a 79 yo CF with PMH NSCLC, adenocarcinoma (chronic 8L O2 at home), DMII, HTN, OSA on CPAP, HTN, hypothyroidism, anxiety who presented with worsening SOB and hypoxia at home.  She has been on systemic chemo previously and per oncology is s/p 23 cycles at this point. Previous oncology notes also reviewed; she has been on single agent Alimta 500 mg IV q3 weeks.  She was also recently hospitalized 09/30/2019 until 10/12/2019 for ongoing respiratory distress, pneumonia, interstitial pneumonitis. She was treated with antibiotics, steroids, and bronchodilators. She was weaned down to 7 to 8 L nasal cannula oxygen by discharge. Her CXR on admission was actually improved in comparison to her last CXR on 10/06/2019. She was admitted and started on vancomycin, cefepime, doxycycline. She was also noted to have an AKI on admission as well and started on IV fluids. Due to her worsening overall status, advanced malignancy, goals of care were discussed and her CODE STATUS was changed to DNR.  She was also evaluated by oncology and palliative care. Patient is now interested on hospice at home.  She does not want to continue chemotherapy .  Plan is to discharge to home with hospice today.  Following problems were addressed during her hospitalization:  Severe sepsis with acute organ dysfunction ([place]) -organ dysfunction due to pneumonia in immunocompromised patient-on presentation she was tachycardic, tachypneicandleukopenic.Source of infection is pneumonia. Has AKI and mildly elevated troponin. -Broad-spectrum antibiotic with cefepime, vancomycin and doxycycline;on  admission. -Completed course of antibiotics  Adenocarcinoma of lung, stage 4 ([place]) - patient has had23 cycles of chemo at this point. She has poor functional status with ongoing decline and has been on monotherapy Alimta. She has not been a candidate for other treatments due to her functional status. She is now dependent on 8 L nasal cannula oxygen at home and is now having multiple hospitalizations with worsening quality of life. Bedside discussions held this morning with patient and her husband. We discussed goals of care and she then elected for becoming DNR . Patient is now interested on hospice at home.  She does not want to continue chemotherapy .  Acute respiratory failure with hypoxia (HCC) - continue O2, escalate if needed but patient now DNR -Continue on 7 to 8 L of oxygen per minute. -Chest x-ray done this morning still shows multifocal pneumonia, some  pulmonary edema.  We will give her few doses of Lasix  IV once.  Continue Lasix 20 mg daily on discharge.  AKI (acute kidney injury) ([place]) - Improved with IV fluids.  IV fluids stopped.  Hyperkalemia - Resolved  Essential hypertension -Currently stable. Amlodipine D/ced  Diabetes mellitus without complication (HCC) - follow up A1c=5.9  History of pulmonary embolism - continue Xarelto -Has thrombocytopenia but improved to 59 .  So Xarelto resumed.Since she is being discharged to home with hospice, will discontinue Xarelto.  Hypothyroid - continue synthroid  OSA on CPAP - continue CPAP  Rash -Appears to have developed a possible drug rash on 11/03/2019. She has been on cefepime and doxycycline. Unclear at this time which oneis associated with rash -No associated shortness of breath, stridor, or difficulty breathing -Was given IV Benadryl and Solu-Medrol on 11/03/2019.  -Doxycycline discontinued  Debility/deconditioning -PT consulted.  But now the plan is for discharge to home with  hospice.      Discharge Diagnoses:  Active Problems:   OSA on CPAP   Essential hypertension   Hypothyroid   AKI (acute kidney injury) (HCC)   Severe sepsis with acute organ dysfunction (HCC)   Adenocarcinoma of lung, stage 4 (HCC)   Acute respiratory failure with hypoxia (HCC)   DNR (do not resuscitate)   Hyperkalemia   Hyponatremia   Diabetes mellitus without complication ([place])   History of pulmonary embolism   Rash    Discharge Instructions  Discharge Instructions    Diet - low sodium heart healthy   Complete by: As directed    Discharge instructions   Complete by: As directed    1)Please take prescribed medications as instructed. 2)Follow up with hospice at home.   Increase activity slowly   Complete by: As directed    No wound care   Complete by: As directed      Allergies as of 11/07/2019      Reactions   Pembrolizumab Anaphylaxis   Chlorhexidine Dermatitis   Adhesive [tape] Other (See Comments)   Irritates skin    Codeine Other (See Comments)   Skin crawlinf feeling   Lactose Intolerance (gi) Diarrhea   Monosodium Glutamate Diarrhea   Sulfa Antibiotics Nausea And Vomiting      Medication List    STOP taking these medications   amLODipine 5 MG tablet Commonly known as: NORVASC   Xarelto 20 MG Tabs tablet Generic drug: rivaroxaban     TAKE these medications   Breztri Aerosphere 160-9-4.8 MCG/ACT Aero Generic drug: Budeson-Glycopyrrol-Formoterol Take 2 puffs by mouth 2 (two) times daily.   clorazepate 7.5 MG tablet Commonly known as: TRANXENE Take 3.75 mg by mouth in the morning and at bedtime.   dexamethasone 4 MG tablet Commonly known as: DECADRON Please take 1 tablet twice a day the day before, the day of, and the day after chemo therapy.   folic acid 1 MG tablet Commonly known as: FOLVITE Take 1 tablet (1 mg total) by mouth daily.   furosemide 20 MG tablet Commonly known as: Lasix Take 1 tablet (20 mg total) by mouth daily.    guaiFENesin-dextromethorphan 100-10 MG/5ML syrup Commonly known as: ROBITUSSIN DM Take 5 mLs by mouth every 4 (four) hours as needed for cough.   ipratropium-albuterol 0.5-2.5 (3) MG/3ML Soln Commonly known as: DUONEB INHALE THE CONTENTS OF 1 VIAL VIA NEBULIZER 3 TIMES A DAY. What changed:   how much to take  how to take this  when to take this  reasons to take this  additional instructions   levothyroxine 112 MCG tablet Commonly known as: SYNTHROID Take 112 mcg by mouth daily.   metoprolol succinate 50 MG 24 hr tablet Commonly known as: TOPROL-XL Take 50 mg by mouth 2 (two) times daily.   oxyCODONE 5 MG immediate release tablet Commonly known as: Roxicodone Take 1 tablet (5 mg total) by mouth every 8 (eight) hours as needed.   polyethylene glycol 17 g packet Commonly known as: MIRALAX / GLYCOLAX Take 17 g by mouth daily as needed for mild constipation.   prochlorperazine 10 MG tablet Commonly known as: COMPAZINE Take 1 tablet (10 mg total) by mouth every 6 (six) hours as needed. What changed: reasons to take this   rosuvastatin 10 MG tablet Commonly known as: CRESTOR Take 10 mg by mouth every evening.   traZODone 50 MG tablet Commonly known  as: DESYREL Take 0.5 tablets (25 mg total) by mouth at bedtime as needed for sleep.   valACYclovir 500 MG tablet Commonly known as: VALTREX Take 500 mg by mouth daily.       Follow-up Information    [name], MD. Schedule an appointment as soon as possible for a visit in 1 week(s).   Specialty: Internal Medicine Contact information: [place] [address] [phone]              Allergies  Allergen Reactions  . Pembrolizumab Anaphylaxis  . Chlorhexidine Dermatitis  . Adhesive [Tape] Other (See Comments)    Irritates skin   . Codeine Other (See Comments)    Skin crawlinf feeling  . Lactose Intolerance (Gi) Diarrhea  . Monosodium Glutamate Diarrhea  . Sulfa Antibiotics Nausea  And Vomiting    Consultations:  Palliative care, oncology   Procedures/Studies: DG Chest Port 1 View  Result Date: 11/06/2019 CLINICAL DATA:  Congestion EXAM: PORTABLE CHEST 1 VIEW COMPARISON:  October 31, 2019 FINDINGS: Port-A-Cath tip is in the right atrium slightly beyond the superior vena cava-right atrium junction. No pneumothorax. There is airspace opacity in the mid and lower lung regions, similar to recent study. There is a small left pleural effusion. Heart is upper normal in size with pulmonary vascularity normal. No adenopathy. No bone lesions. IMPRESSION: Central catheter position unchanged. Multifocal airspace opacity consistent with multifocal pneumonia. A degree of underlying pulmonary edema cannot be excluded. Small left pleural effusion. Cardiac silhouette stable. No adenopathy evident. Electronically Signed   By: Lowella Grip III M.D.   On: 11/06/2019 10:29   DG Chest Port 1 View  Result Date: 10/31/2019 CLINICAL DATA:  Shortness of breath cough EXAM: PORTABLE CHEST 1 VIEW COMPARISON:  10/06/2019 FINDINGS: RIGHT-sided Port-A-Cath remains in place terminating in the upper RIGHT atrium. Trachea midline. Cardiomediastinal contours and hilar structures are unremarkable. Persistent interstitial and airspace disease in the RIGHT chest predominantly throughout the RIGHT chest also in the LEFT lung base. On limited assessment visualized skeletal structures without acute process. IMPRESSION: Persistent bilateral airspace and interstitial changes without significant change since prior imaging suspicious for pneumonia. Asymmetric edema could have a similar appearance though prior cross-sectional imaging appeared more compatible with pneumonia. Possible small effusions. Electronically Signed   By: Zetta Bills M.D.   On: 10/31/2019 10:16       Subjective: Patient seen and examined the bedside this morning.  No new concerns.  Plan is to discharge her to home with  hospice.  Discharge Exam: Vitals:   11/07/19 0421 11/07/19 0903  BP: (!) 105/53   Pulse: (!) 105   Resp: 20   Temp: 97.9 F (36.6 C)   SpO2: 95% (!) 89%   Vitals:   11/06/19 2105 11/06/19 2110 11/07/19 0421 11/07/19 0903  BP:   (!) 105/53   Pulse:   (!) 105   Resp: 18 20 20    Temp:   97.9 F (36.6 C)   TempSrc:   Oral   SpO2:   95% (!) 89%  Weight:      Height:        General: Pt is alert, awake, not in acute distress Cardiovascular: RRR, S1/S2 +, no rubs, no gallops Respiratory: Diminished air sounds bilaterally Abdominal: Soft, NT, ND, bowel sounds + Extremities: no edema, no cyanosis    The results of significant diagnostics from this hospitalization (including imaging, microbiology, ancillary and laboratory) are listed below for reference.     Microbiology: Recent  Results (from the past 240 hour(s))  Culture, blood (routine x 2)     Status: None   Collection Time: 10/31/19  9:41 AM   Specimen: BLOOD LEFT FOREARM  Result Value Ref Range Status   Specimen Description   Final    BLOOD LEFT FOREARM Performed at [facility], [address], [place], [place] [address]    Special Requests   Final    BOTTLES DRAWN AEROBIC AND ANAEROBIC Blood Culture adequate volume Performed at [facility] [address], [place], [place] [address]    Culture   Final    NO GROWTH 5 DAYS Performed at [facility], [place] [address], [place], [place] [address]    Report Status 11/05/2019 FINAL  Final  Culture, blood (routine x 2)     Status: None   Collection Time: 10/31/19 11:10 AM   Specimen: BLOOD  Result Value Ref Range Status   Specimen Description   Final    BLOOD RIGHT ANTECUBITAL Performed at [facility] [address] [place], [place] [address]    Special Requests   Final    BOTTLES DRAWN AEROBIC AND ANAEROBIC Blood Culture adequate volume Performed at [facility] [address],  [place], [place] [address]    Culture   Final    NO GROWTH 5 DAYS Performed at [facility], [place] [address] [place], [place] [address]    Report Status 11/05/2019 FINAL  Final  SARS Coronavirus 2 by RT PCR (hospital order, performed in [facility] hospital lab) Nasopharyngeal Nasopharyngeal Swab     Status: None   Collection Time: 10/31/19 12:08 PM   Specimen: Nasopharyngeal Swab  Result Value Ref Range Status   SARS Coronavirus 2 NEGATIVE NEGATIVE Final    Comment: (NOTE) SARS-CoV-2 target nucleic acids are NOT DETECTED.  The SARS-CoV-2 RNA is generally detectable in upper and lower respiratory specimens during the acute phase of infection. The lowest concentration of SARS-CoV-2 viral copies this assay can detect is 250 copies / mL. A negative result does not preclude SARS-CoV-2 infection and should not be used as the sole basis for treatment or other patient management decisions.  A negative result may occur with improper specimen collection / handling, submission of specimen other than nasopharyngeal swab, presence of viral mutation(s) within the areas targeted by this assay, and inadequate number of viral copies (<250 copies / mL). A negative result must be combined with clinical observations, patient history, and epidemiological information.  Fact Sheet for Patients:   StrictlyIdeas.no  Fact Sheet for Healthcare Providers: BankingDealers.co.za  This test is not yet approved or  cleared by the [place] FDA and has been authorized for detection and/or diagnosis of SARS-CoV-2 by FDA under an Emergency Use Authorization (EUA).  This EUA will remain in effect (meaning this test can be used) for the duration of the COVID-19 declaration under Section 564(b)(1) of the Act, 21 U.S.C. section 360bbb-3(b)(1), unless the authorization is terminated or revoked sooner.  Performed at [facility], [place] [address] [place], [place] [address]   MRSA PCR Screening     Status: None   Collection Time: 11/01/19  2:47 AM   Specimen: Nasal Mucosa; Nasopharyngeal  Result Value Ref Range Status   MRSA by PCR NEGATIVE NEGATIVE Final    Comment:        The GeneXpert MRSA Assay (FDA approved for NASAL specimens only), is one component of a comprehensive  MRSA colonization surveillance program. It is not intended to diagnose MRSA infection nor to guide or monitor treatment for MRSA infections. Performed at [facility], [place] [address] [place], [place] [address]      Labs: BNP (last 3 results) Recent Labs    09/30/19 1325 10/31/19 0941  BNP 97.7 17.5   Basic Metabolic Panel: Recent Labs  Lab 11/02/19 0828 11/02/19 0828 11/03/19 0547 11/04/19 0239 11/05/19 0359 11/06/19 0355 11/07/19 0341  NA 136   < > 135 136 134* 140 140  K 4.5   < > 4.3 5.1 4.7 4.1 3.7  CL 102   < > 102 102 103 108 106  CO2 24   < > 24 23 23 24 26   GLUCOSE 105*   < > 108* 189* 117* 96 112*  BUN 38*   < > 41* 47* 56* 49* 38*  CREATININE 1.59*   < > 1.81* 2.15* 1.68* 1.52* 1.60*  CALCIUM 8.6*   < > 8.4* 8.9 8.5* 8.7* 8.6*  MG 2.0  --  2.1 2.0 2.1  --   --    < > = values in this interval not displayed.   Liver Function Tests: No results for input(s): AST, ALT, ALKPHOS, BILITOT, PROT, ALBUMIN in the last 168 hours. No results for input(s): LIPASE, AMYLASE in the last 168 hours. No results for input(s): AMMONIA in the last 168 hours. CBC: Recent Labs  Lab 11/02/19 0828 11/03/19 0547 11/04/19 0239 11/05/19 0359 11/06/19 0355  WBC 4.7 5.8 4.9 7.7 5.3  NEUTROABS 3.9 4.8 4.5 6.4 4.1  HGB 10.5* 9.8* 9.6* 8.9* 9.1*  HCT 33.8* 31.4* 31.4* 28.4* 30.1*  MCV 102.4* 102.6* 103.3* 101.1* 103.8*  PLT 72* 59* 49* 59* 48*   Cardiac Enzymes: No results for input(s): CKTOTAL, CKMB, CKMBINDEX, TROPONINI in the last 168 hours. BNP: Invalid input(s): POCBNP CBG: Recent Labs  Lab 11/06/19 0747  11/06/19 1133 11/06/19 1719 11/06/19 2048 11/07/19 0738  GLUCAP 81 127* 92 113* 88   D-Dimer No results for input(s): DDIMER in the last 72 hours. Hgb A1c No results for input(s): HGBA1C in the last 72 hours. Lipid Profile No results for input(s): CHOL, HDL, LDLCALC, TRIG, CHOLHDL, LDLDIRECT in the last 72 hours. Thyroid function studies No results for input(s): TSH, T4TOTAL, T3FREE, THYROIDAB in the last 72 hours.  Invalid input(s): FREET3 Anemia work up No results for input(s): VITAMINB12, FOLATE, FERRITIN, TIBC, IRON, RETICCTPCT in the last 72 hours. Urinalysis    Component Value Date/Time   COLORURINE STRAW (A) 10/02/2019 1030   APPEARANCEUR CLEAR 10/02/2019 1030   LABSPEC 1.013 10/02/2019 1030   PHURINE 6.0 10/02/2019 1030   GLUCOSEU NEGATIVE 10/02/2019 1030   HGBUR NEGATIVE 10/02/2019 1030   BILIRUBINUR NEGATIVE 10/02/2019 1030   [place] 10/02/2019 1030   PROTEINUR NEGATIVE 10/02/2019 1030   NITRITE NEGATIVE 10/02/2019 1030   LEUKOCYTESUR NEGATIVE 10/02/2019 1030   Sepsis Labs Invalid input(s): PROCALCITONIN,  WBC,  LACTICIDVEN Microbiology Recent Results (from the past 240 hour(s))  Culture, blood (routine x 2)     Status: None   Collection Time: 10/31/19  9:41 AM   Specimen: BLOOD LEFT FOREARM  Result Value Ref Range Status   Specimen Description   Final    BLOOD LEFT FOREARM Performed at [facility], [place] [address] [place], [place] [address]    Special Requests   Final    BOTTLES DRAWN AEROBIC AND ANAEROBIC Blood Culture adequate volume Performed at [facility], [place] [address], [place], [place] [address]    Culture   Final    NO GROWTH 5 DAYS Performed at [facility], [place] [address], [place], [place] [address]    Report Status 11/05/2019 FINAL  Final  Culture, blood (routine x 2)     Status: None   Collection Time: 10/31/19 11:10 AM   Specimen: BLOOD  Result Value Ref Range Status   Specimen Description    Final    BLOOD RIGHT ANTECUBITAL Performed at [facility] [address], [place], [place] [address]    Special Requests   Final    BOTTLES DRAWN AEROBIC AND ANAEROBIC Blood Culture adequate volume Performed at [facility] [address], [place], [place] [address]    Culture   Final    NO GROWTH 5 DAYS Performed at [facility], [place] [address] [place], [place] [address]    Report Status 11/05/2019 FINAL  Final  SARS Coronavirus 2 by RT PCR (hospital order, performed in [facility] hospital lab) Nasopharyngeal Nasopharyngeal Swab     Status: None   Collection Time: 10/31/19 12:08 PM   Specimen: Nasopharyngeal Swab  Result Value Ref Range Status   SARS Coronavirus 2 NEGATIVE NEGATIVE Final    Comment: (NOTE) SARS-CoV-2 target nucleic acids are NOT DETECTED.  The SARS-CoV-2 RNA is generally detectable in upper and lower respiratory specimens during the acute phase of infection. The lowest concentration of SARS-CoV-2 viral copies this assay can detect is 250 copies / mL. A negative result does not preclude SARS-CoV-2 infection and should not be used as the sole basis for treatment or other patient management decisions.  A negative result may occur with improper specimen collection / handling, submission of specimen other than nasopharyngeal swab, presence of viral mutation(s) within the areas targeted by this assay, and inadequate number of viral copies (<250 copies / mL). A negative result must be combined with clinical observations, patient history, and epidemiological information.  Fact Sheet for Patients:   StrictlyIdeas.no  Fact Sheet for Healthcare Providers: BankingDealers.co.za  This test is not yet approved or  cleared by the [place] FDA and has been authorized for detection and/or diagnosis of SARS-CoV-2 by FDA under an Emergency Use Authorization (EUA).  This EUA will  remain in effect (meaning this test can be used) for the duration of the COVID-19 declaration under Section 564(b)(1) of the Act, 21 U.S.C. section 360bbb-3(b)(1), unless the authorization is terminated or revoked sooner.  Performed at [facility], [place] [address], [place], [place] [address]   MRSA PCR Screening     Status: None   Collection Time: 11/01/19  2:47 AM   Specimen: Nasal Mucosa; Nasopharyngeal  Result Value Ref Range Status   MRSA by PCR NEGATIVE NEGATIVE Final    Comment:        The GeneXpert MRSA Assay (FDA approved for NASAL specimens only), is one component of a comprehensive MRSA colonization surveillance program. It is not intended to diagnose MRSA infection nor to guide or monitor treatment for MRSA infections. Performed at [facility], [place] [address], [place], [place] [address]     Please note: You were cared for by a hospitalist during your hospital stay. Once you are discharged, your primary care physician will handle any further medical issues. Please note that NO REFILLS for any discharge medications will be authorized once you are discharged, as it is imperative that you return to  your primary care physician (or establish a relationship with a primary care physician if you do not have one) for your post hospital discharge needs so that they can reassess your need for medications and monitor your lab values.    Time coordinating discharge: 40 minutes  SIGNED:   Shelly Coss, MD  [facility] 11/07/2019, 11:02 AM Pager [phone]  If 7PM-7AM, please contact night-coverage [email or web address] Password [email or web address]

## 2019-11-08 DIAGNOSIS — A419 Sepsis, unspecified organism: Secondary | ICD-10-CM | POA: Diagnosis not present

## 2019-11-08 DIAGNOSIS — E43 Unspecified severe protein-calorie malnutrition: Secondary | ICD-10-CM | POA: Diagnosis not present

## 2019-11-08 DIAGNOSIS — D6481 Anemia due to antineoplastic chemotherapy: Secondary | ICD-10-CM | POA: Diagnosis not present

## 2019-11-08 DIAGNOSIS — R6520 Severe sepsis without septic shock: Secondary | ICD-10-CM | POA: Diagnosis not present

## 2019-11-08 DIAGNOSIS — J9621 Acute and chronic respiratory failure with hypoxia: Secondary | ICD-10-CM | POA: Diagnosis not present

## 2019-11-08 DIAGNOSIS — D696 Thrombocytopenia, unspecified: Secondary | ICD-10-CM | POA: Diagnosis not present

## 2019-11-08 DIAGNOSIS — T451X5D Adverse effect of antineoplastic and immunosuppressive drugs, subsequent encounter: Secondary | ICD-10-CM | POA: Diagnosis not present

## 2019-11-08 DIAGNOSIS — N179 Acute kidney failure, unspecified: Secondary | ICD-10-CM | POA: Diagnosis not present

## 2019-11-08 DIAGNOSIS — J189 Pneumonia, unspecified organism: Secondary | ICD-10-CM | POA: Diagnosis not present

## 2019-11-08 DIAGNOSIS — R21 Rash and other nonspecific skin eruption: Secondary | ICD-10-CM | POA: Diagnosis not present

## 2019-11-08 DIAGNOSIS — Z86711 Personal history of pulmonary embolism: Secondary | ICD-10-CM | POA: Diagnosis not present

## 2019-11-08 DIAGNOSIS — C3491 Malignant neoplasm of unspecified part of right bronchus or lung: Secondary | ICD-10-CM | POA: Diagnosis not present

## 2019-11-13 ENCOUNTER — Ambulatory Visit: Payer: PPO

## 2019-11-13 ENCOUNTER — Other Ambulatory Visit: Payer: PPO

## 2019-11-13 ENCOUNTER — Ambulatory Visit: Payer: PPO | Admitting: Internal Medicine

## 2019-11-19 LAB — ACID FAST CULTURE WITH REFLEXED SENSITIVITIES (MYCOBACTERIA): Acid Fast Culture: NEGATIVE

## 2019-11-19 IMAGING — CR DG CHEST 2V
2 series · 2 of 2 positions shown · non-contrast
Comparison: High-resolution chest CT 07/12/2017 and earlier.

CLINICAL DATA: 76-year-old female with nausea vomiting. Fever.
Prior right lung surgery for carcinoid. Cough since December 2016.

EXAM:
CHEST - 2 VIEW

[w chest pa]
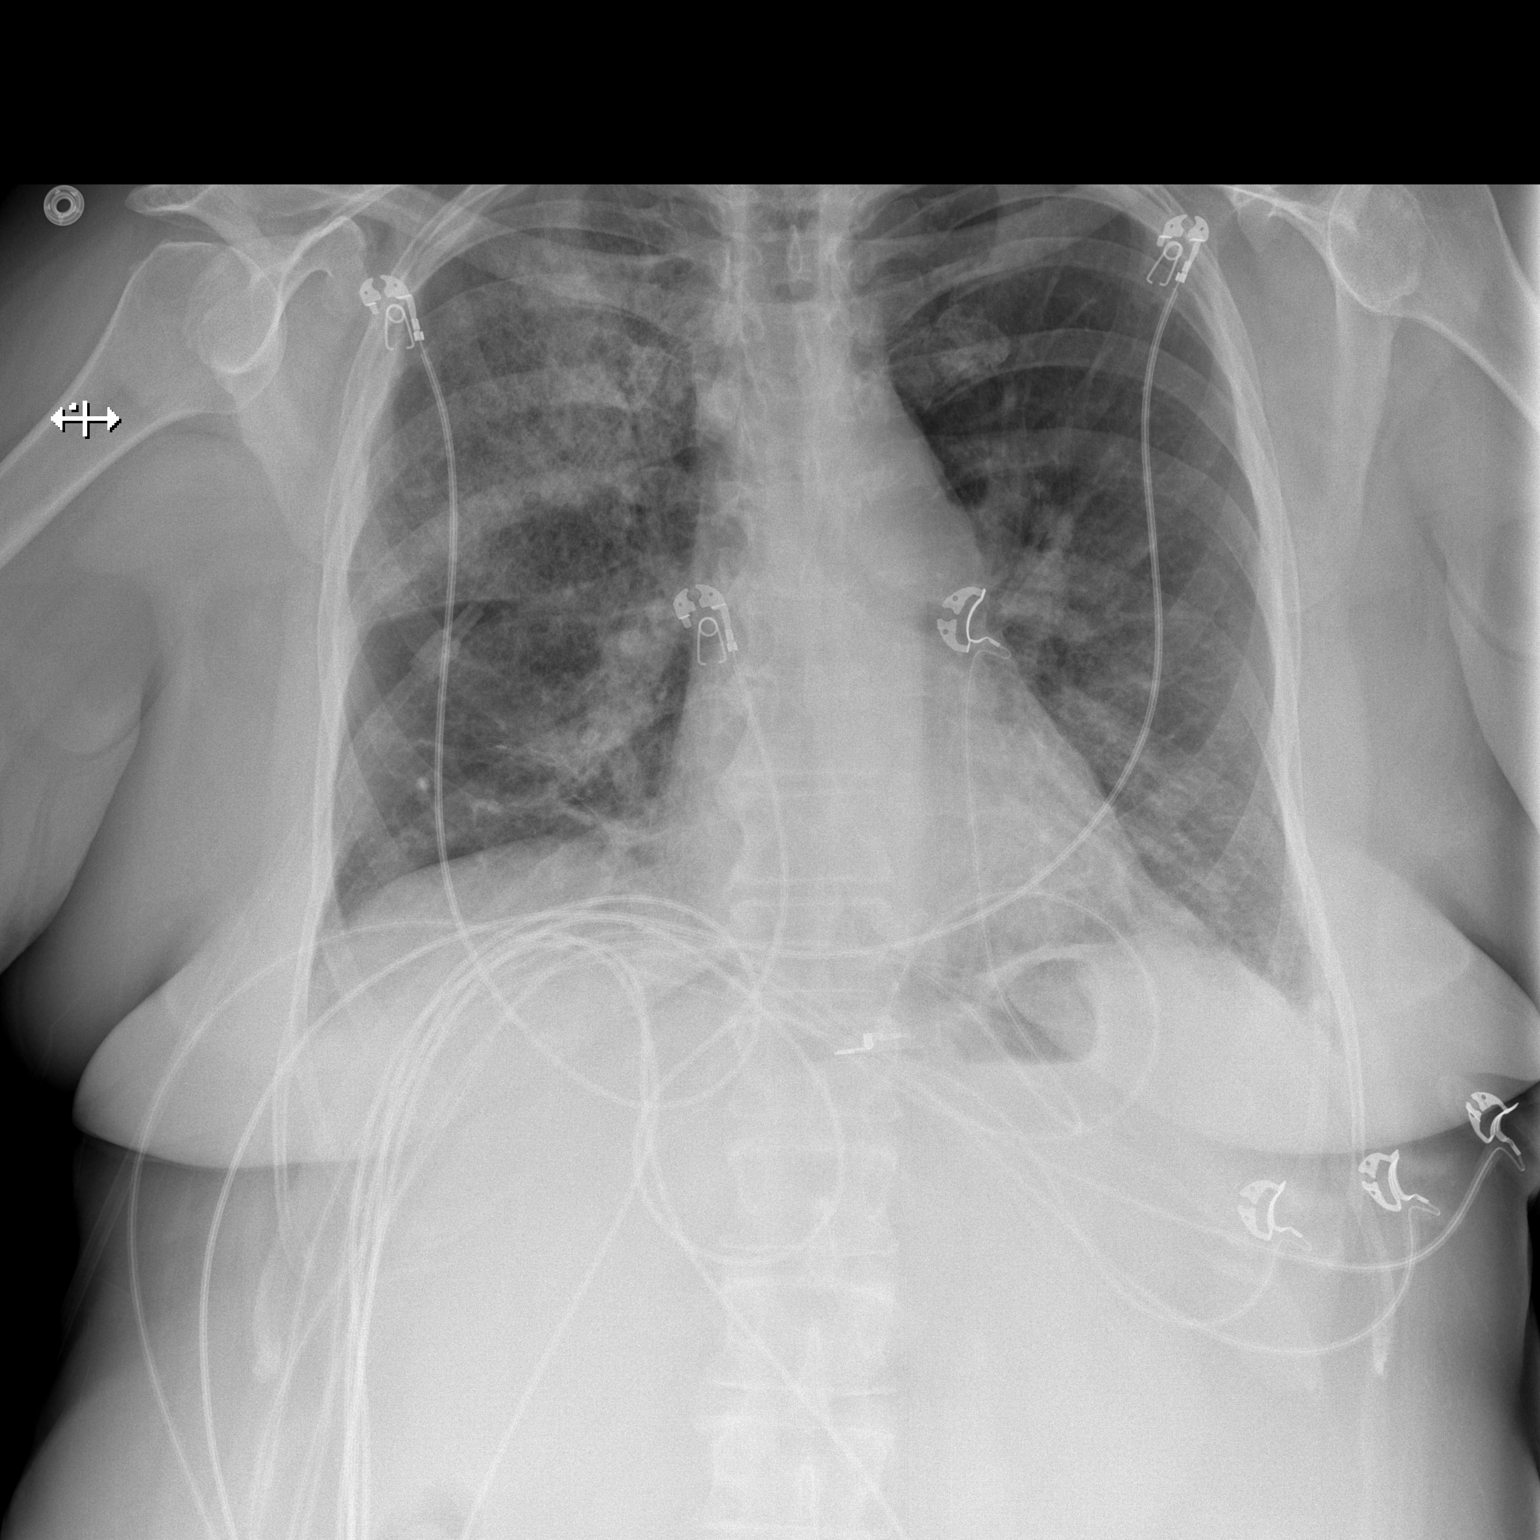

[w chest lat]
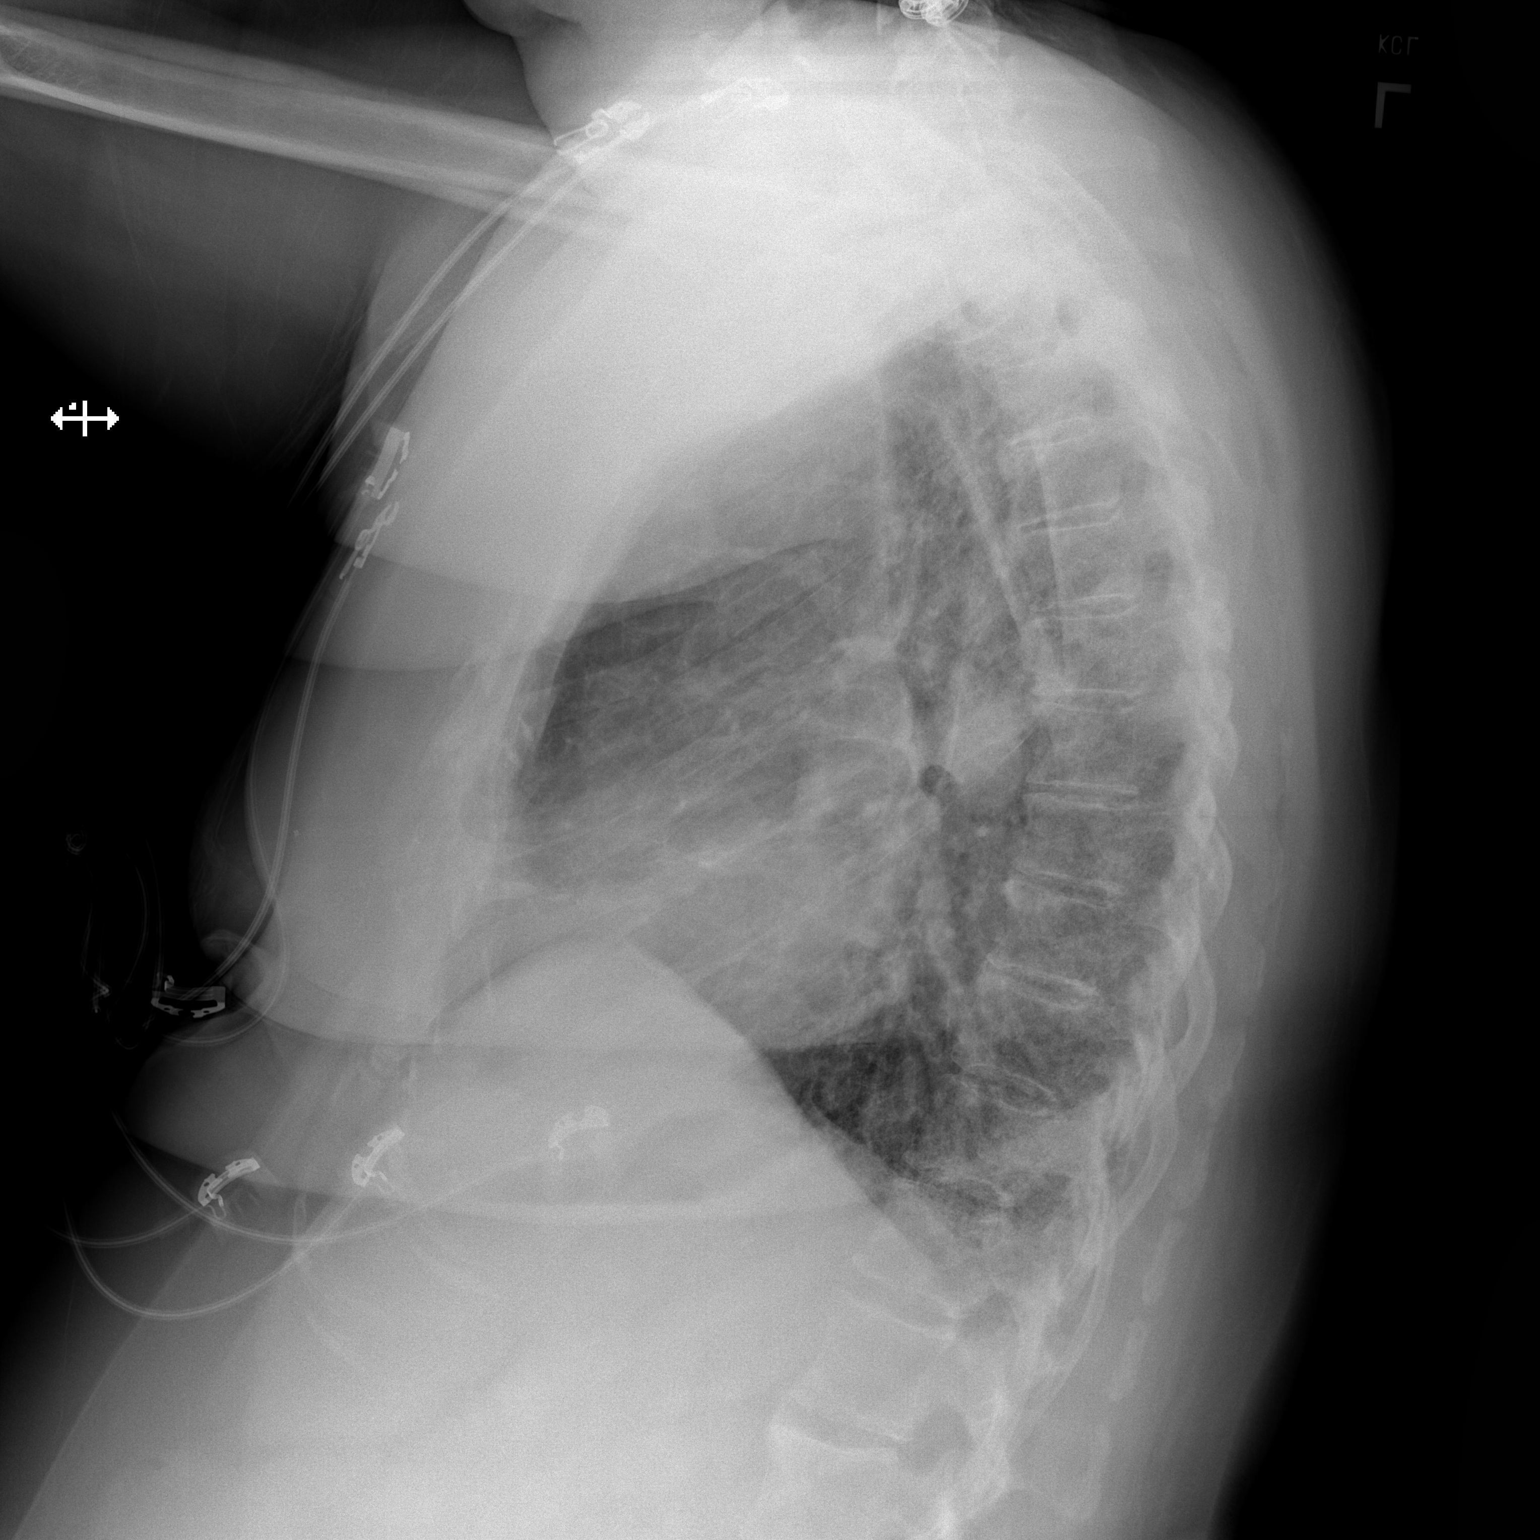

[2 of 2 positions shown; findings below may reference images not displayed]

FINDINGS: Progressed upper and lower lung ground-glass opacity since
07/03/2017 radiographs, maximal in the right upper lobe. No
consolidation. Findings are likely stable from the recent CT. No
superimposed pneumothorax or pleural effusion. Visualized tracheal
air column is within normal limits. Mediastinal contours remain
normal. No acute osseous abnormality identified. Paucity of bowel
gas in the upper abdomen.
IMPRESSION: 1. Abnormal bilateral pulmonary ground-glass opacity, maximal in the
right upper lobe. No associated pleural effusion.
2. These lung findings are probably not significantly changed from
the recent High-resolution Chest CT on 07/12/2017. The presence of
fever would favor atypical infection, but please see that CT report
regarding all differential diagnostic considerations.

## 2019-11-25 LAB — ACID FAST CULTURE WITH REFLEXED SENSITIVITIES (MYCOBACTERIA): Acid Fast Culture: NEGATIVE

## 2019-12-05 ENCOUNTER — Other Ambulatory Visit: Payer: PPO

## 2019-12-05 ENCOUNTER — Ambulatory Visit: Payer: PPO

## 2019-12-05 ENCOUNTER — Ambulatory Visit: Payer: PPO | Admitting: Internal Medicine

## 2019-12-11 IMAGING — CT NM PET TUM IMG INITIAL (PI) SKULL BASE T - THIGH
8 series · 25 of 25 positions shown · non-contrast
Comparison: CT abdomen pelvis 07/18/2017 [address] chest 07/12/2017.

CLINICAL DATA: Initial treatment strategy for lung cancer.

EXAM:
NUCLEAR MEDICINE PET SKULL BASE TO THIGH
TECHNIQUE: 10.5 mCi F-18 FDG was injected intravenously. Full-ring PET imaging
was performed from the skull base to thigh after the radiotracer. CT
data was obtained and used for attenuation correction and anatomic
localization.
Fasting blood glucose: 117 mg/dl

[Series 3: pet sk_thigh ac · axial · 5.0mm · 4.07mm/px · z∈[-1488,-620]mm · 5 of 218 slices shown]
[im 1/218]
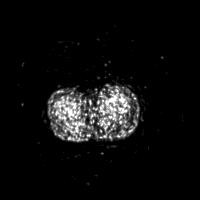
[im 55/218]
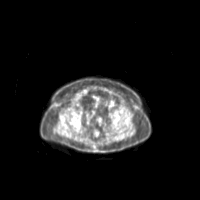
[im 109/218]
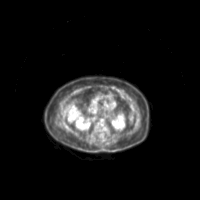
[im 163/218]
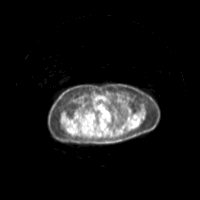
[im 218/218]
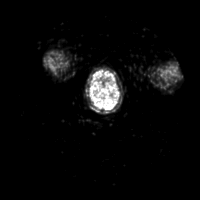

[Series 4: ct sk_thigh 5.0 b31f · axial · 5.0mm · 0.98mm/px · z∈[-1488,-620]mm · 5 of 218 slices shown]
[im 1/218  brain]
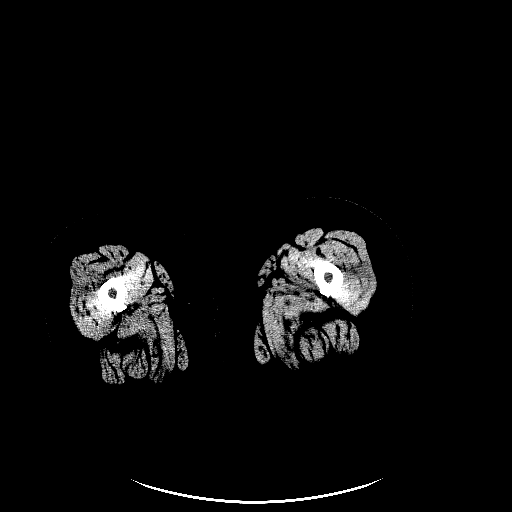
[im 55/218  brain]
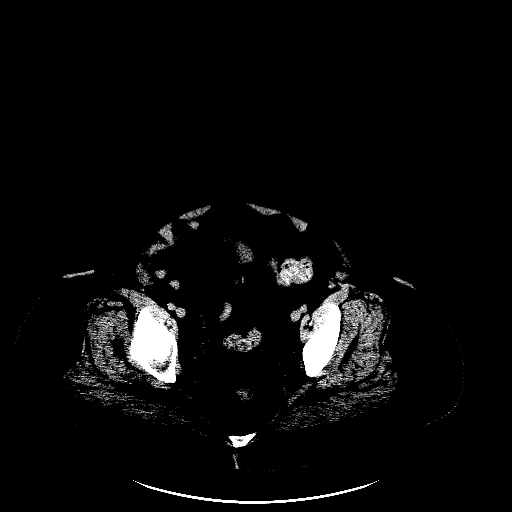
[im 109/218]
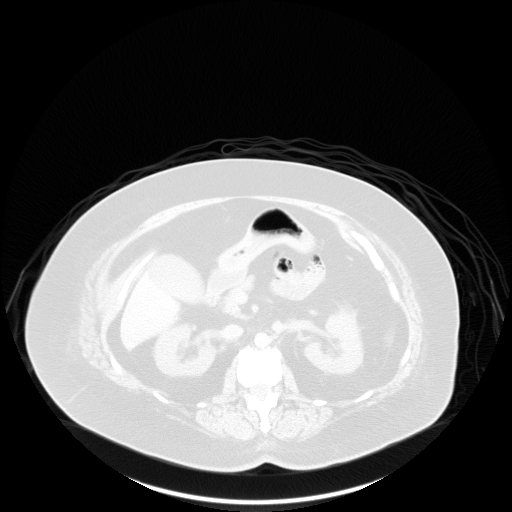
[im 163/218]
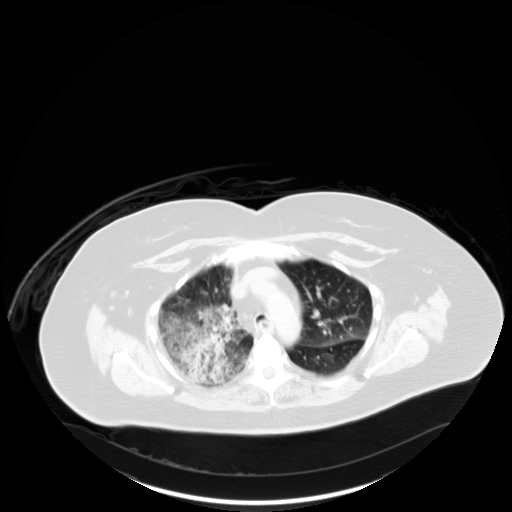
[im 218/218  brain]
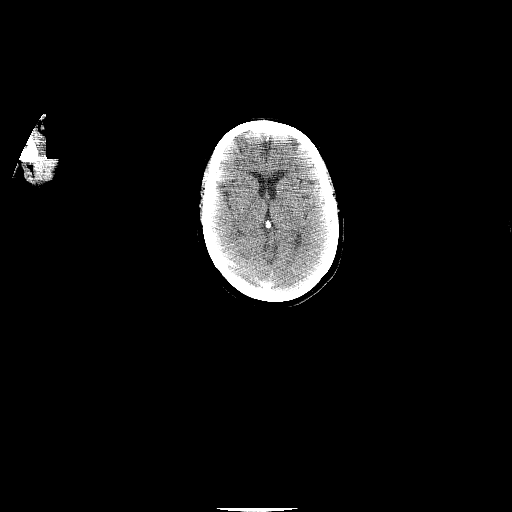

[Series 5: pet sk_thigh nac · axial · 5.0mm · 4.07mm/px · z∈[-1488,-620]mm · 5 of 218 slices shown]
[im 1/218]
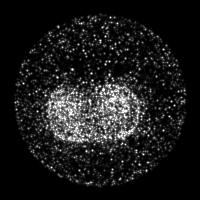
[im 55/218]
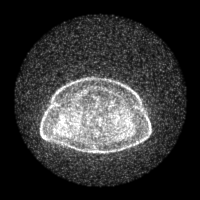
[im 109/218]
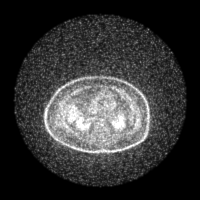
[im 163/218]
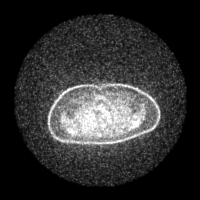
[im 218/218]
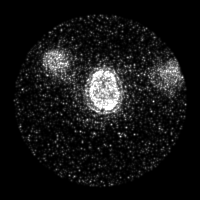

[Series 8: ct sk_thigh 5.0 b70f (id)_bone · axial · 5.0mm · 0.71mm/px · z∈[-1026,-750]mm · 2 of 70 slices shown]
[im 1/70  bone]
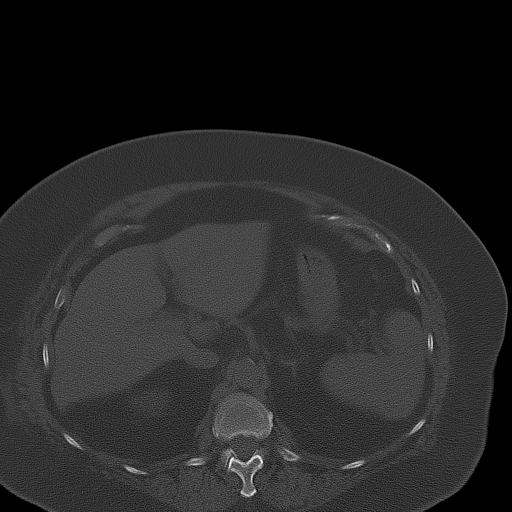
[im 70/70  bone]
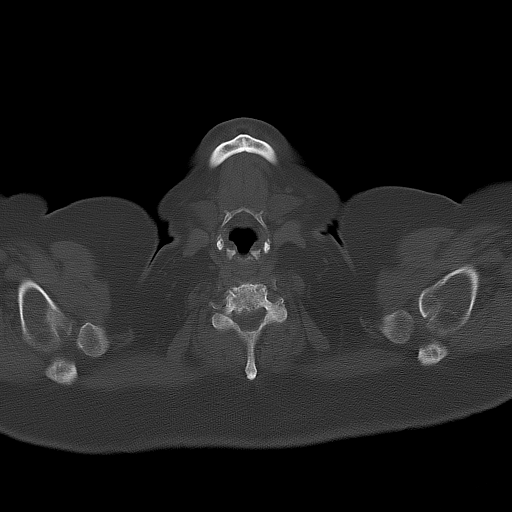

[Series 603: mip range 3 · coronal · 1.80mm/px · 1 of 32 slices shown]
[im 1/32]
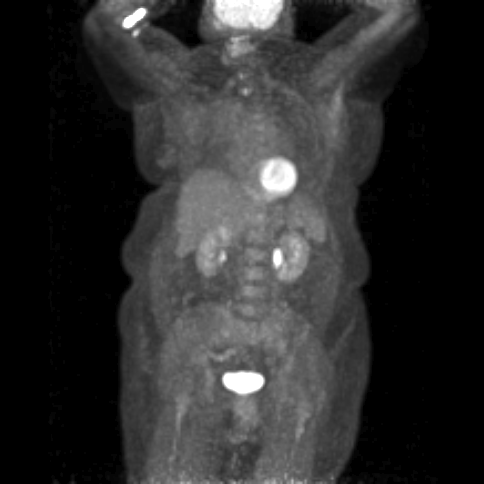

[Series 604: range-ct sk_thigh 5.0 (id)<alpha range> · 2 of 96 slices shown (1 of 2)]
[im 1/96]
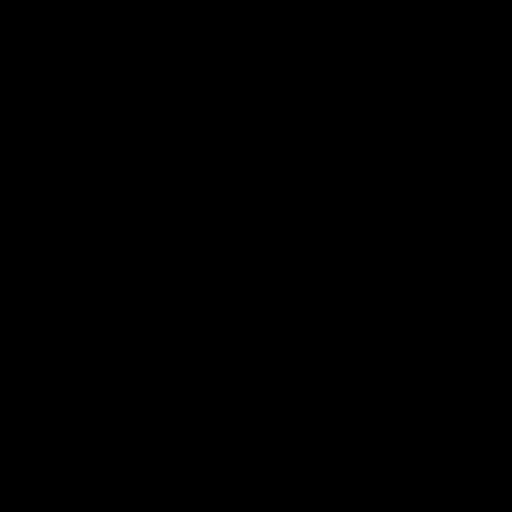
[im 96/96]
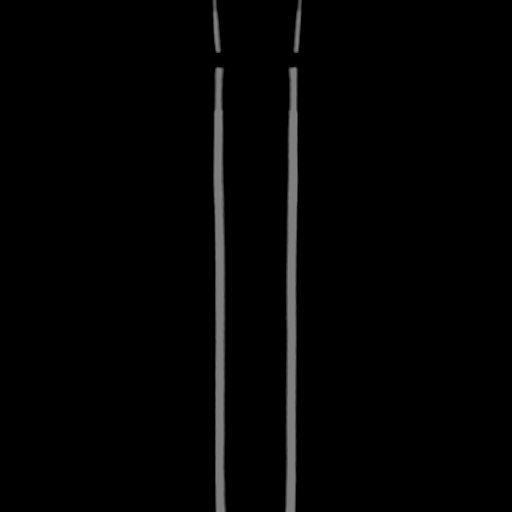

[Series 606: range-ct sk_thigh 5.0 (id)<alpha range> · 4 of 156 slices shown (2 of 2)]
[im 1/156]
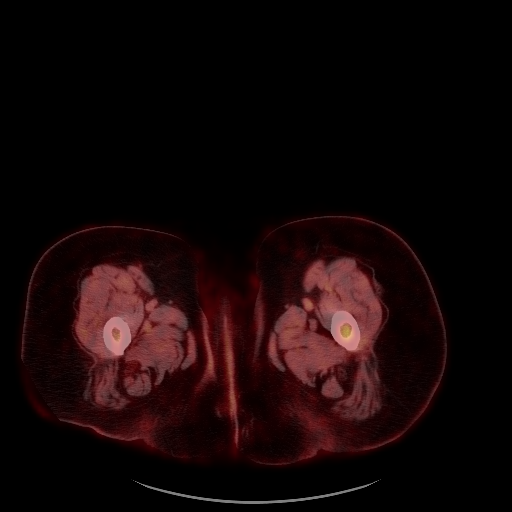
[im 52/156]
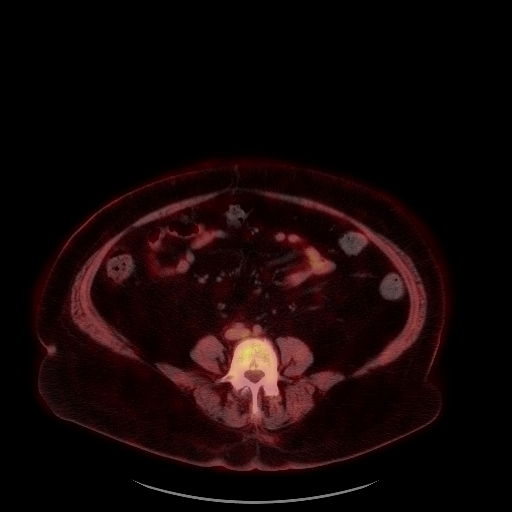
[im 104/156]
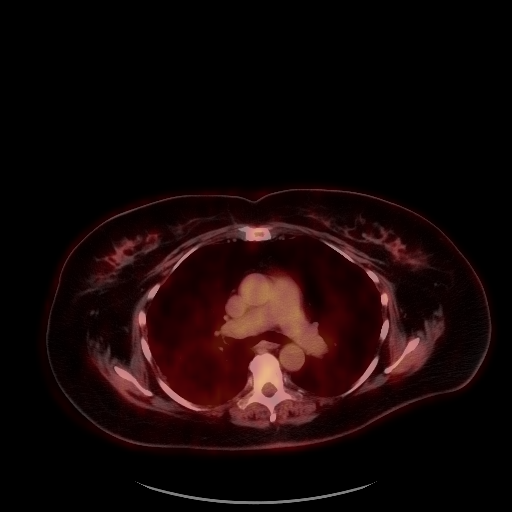
[im 156/156]
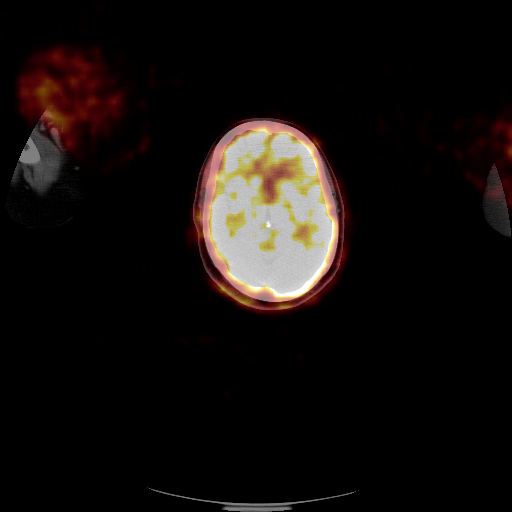

[Series 1072: results mm oncology reading · 1.0mm · 1.01mm/px · 1 of 4 slices shown]
[im 1/4]
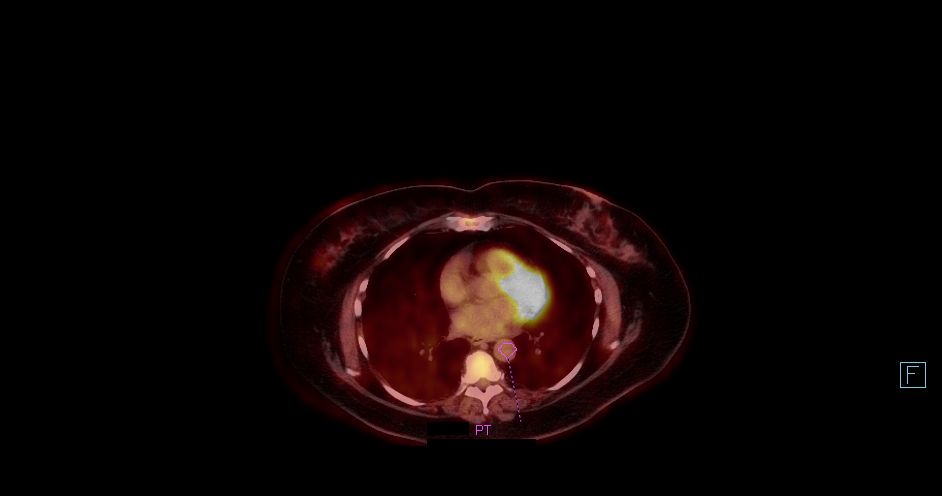

[25 of 25 positions shown; findings below may reference images not displayed]

FINDINGS: Mediastinal blood pool activity: SUV max

NECK: Focal hypermetabolism is seen in the anterior hypopharynx
without a CT correlate. No hypermetabolic lymph nodes.

Incidental CT findings: None.

CHEST: No hypermetabolic mediastinal, hilar or axillary lymph nodes.
There is patchy ground-glass and consolidation in the right upper
lobe with additional patchy ground-glass in lower lobes bilaterally,
with low level associated hypermetabolism. No hypermetabolic
pulmonary nodules.

Incidental CT findings: Atherosclerotic calcification of the
arterial vasculature. No pericardial or pleural effusion.

ABDOMEN/PELVIS: No abnormal hypermetabolism in the liver, adrenal
glands, spleen or pancreas. No hypermetabolic lymph nodes.

Incidental CT findings: Atherosclerotic calcification of the
arterial vasculature without abdominal aortic aneurysm.

SKELETON: No abnormal osseous hypermetabolism.

Incidental CT findings: Degenerative changes in the spine.
IMPRESSION: 1. Borderline hypermetabolic right upper lobe consolidation and
ground-glass with hypometabolic patchy ground-glass in both lower
lobes. Findings may be infectious or inflammatory in etiology.
Difficult to definitively exclude low-grade adenocarcinoma in the
right upper lobe.
2. Focal hypermetabolism along the ventral aspect of the hypopharynx
without a CT correlate.
3.  Aortic atherosclerosis ([id number]).

## 2019-12-19 IMAGING — US IR FLUORO GUIDE CV LINE*R*
1 series · 1 of 1 positions shown · non-contrast
Comparison: PET CT - 08/09/2017;

INDICATION: History of multifocal lung cancer. In need of durable intravenous
for chemotherapy administration.

EXAM:
IMPLANTED PORT A CATH PLACEMENT WITH ULTRASOUND AND FLUOROSCOPIC
GUIDANCE

[Series 1: ir fluoro guide cv line*right* · 0.06mm/px · 1 of 1 slices shown]
[im 1/1]
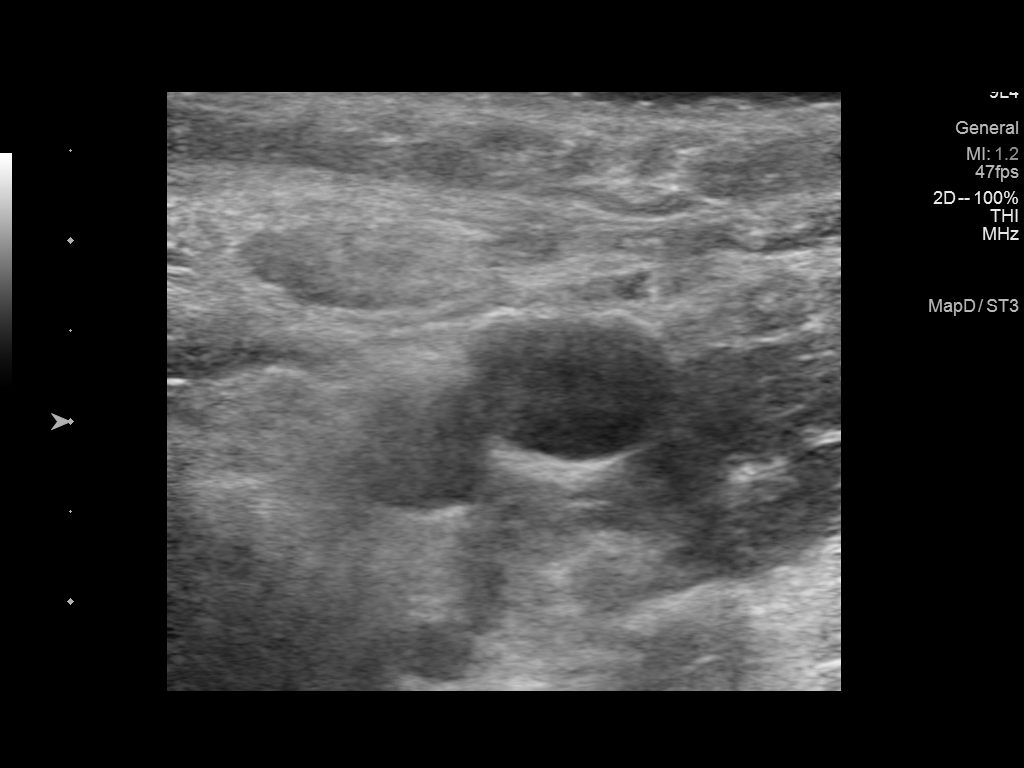

[1 of 1 positions shown; findings below may reference images not displayed]

chest CT  -07/22/2017

MEDICATIONS:
Ancef 2 gm IV; The antibiotic was administered within an appropriate
time interval prior to skin puncture.

ANESTHESIA/SEDATION:
Moderate (conscious) sedation was employed during this procedure. A
total of Versed 2 mg and Fentanyl 100 mcg was administered
intravenously.

Moderate Sedation Time: 28 minutes. The patient's level of
consciousness and vital signs were monitored continuously by
radiology nursing throughout the procedure under my direct
supervision.

CONTRAST:  None

FLUOROSCOPY TIME:  24 seconds (6 mGy)

COMPLICATIONS:
None immediate.

PROCEDURE:
The procedure, risks, benefits, and alternatives were explained to
the patient. Questions regarding the procedure were encouraged and
answered. The patient understands and consents to the procedure.

The right neck and chest were prepped with chlorhexidine in a
sterile fashion, and a sterile drape was applied covering the
operative field. Maximum barrier sterile technique with sterile
gowns and gloves were used for the procedure. A timeout was
performed prior to the initiation of the procedure. Local anesthesia
was provided with 1% lidocaine with epinephrine.

After creating a small venotomy incision, a micropuncture kit was
utilized to access the internal jugular vein. Real-time ultrasound
guidance was utilized for vascular access including the acquisition
of a permanent ultrasound image documenting patency of the accessed
vessel. The microwire was utilized to measure appropriate catheter
length.

A subcutaneous port pocket was then created along the upper chest
wall utilizing a combination of sharp and blunt dissection. The
pocket was irrigated with sterile saline. A single lumen ISP power
injectable port was chosen for placement. The 8 Fr catheter was
tunneled from the port pocket site to the venotomy incision. The
port was placed in the pocket. The external catheter was trimmed to
appropriate length. At the venotomy, an 8 Fr peel-away sheath was
placed over a guidewire under fluoroscopic guidance. The catheter
was then placed through the sheath and the sheath was removed. Final
catheter positioning was confirmed and documented with a
fluoroscopic spot radiograph. The port was accessed with Es Tiger
needle, aspirated and flushed with heparinized saline.

The venotomy site was closed with an interrupted 4-0 Vicryl suture.
The port pocket incision was closed with interrupted 2-0 Vicryl
suture and the skin was opposed with a running subcuticular 4-0
Vicryl suture. Dermabond and Nozato were applied to both
incisions. Dressings were placed. The patient tolerated the
procedure well without immediate post procedural complication.
FINDINGS: After catheter placement, the tip lies within the superior
cavoatrial junction. The catheter aspirates and flushes normally and
is ready for immediate use.
IMPRESSION: Successful placement of a right internal jugular approach power
injectable Port-A-Cath. The catheter is ready for immediate use.

## 2019-12-26 ENCOUNTER — Ambulatory Visit: Payer: PPO

## 2019-12-26 ENCOUNTER — Ambulatory Visit: Payer: PPO | Admitting: Internal Medicine

## 2019-12-26 ENCOUNTER — Other Ambulatory Visit: Payer: PPO

## 2020-02-12 DEATH — deceased

## 2022-12-27 NOTE — Telephone Encounter (Signed)
TC

## 2023-10-04 NOTE — Telephone Encounter (Signed)
 error
